# Patient Record
Sex: Male | Born: 1950 | Hispanic: No | Marital: Single
Health system: Northeastern US, Academic
[De-identification: ages and names within clinical notes are randomized; demographics above are authoritative.]

## PROBLEM LIST (undated history)

## (undated) DIAGNOSIS — Z860100 Personal history of colon polyps, unspecified: Secondary | ICD-10-CM

## (undated) DIAGNOSIS — I472 Ventricular tachycardia, unspecified: Secondary | ICD-10-CM

## (undated) DIAGNOSIS — N529 Male erectile dysfunction, unspecified: Secondary | ICD-10-CM

## (undated) DIAGNOSIS — I2510 Atherosclerotic heart disease of native coronary artery without angina pectoris: Secondary | ICD-10-CM

## (undated) DIAGNOSIS — I493 Ventricular premature depolarization: Secondary | ICD-10-CM

## (undated) DIAGNOSIS — I10 Essential (primary) hypertension: Secondary | ICD-10-CM

## (undated) DIAGNOSIS — I5020 Unspecified systolic (congestive) heart failure: Secondary | ICD-10-CM

## (undated) DIAGNOSIS — M25519 Pain in unspecified shoulder: Secondary | ICD-10-CM

## (undated) DIAGNOSIS — I428 Other cardiomyopathies: Secondary | ICD-10-CM

## (undated) DIAGNOSIS — E119 Type 2 diabetes mellitus without complications: Secondary | ICD-10-CM

## (undated) HISTORY — DX: Essential (primary) hypertension: I10

## (undated) HISTORY — DX: Pain in unspecified shoulder: M25.519

## (undated) HISTORY — DX: Personal history of colon polyps, unspecified: Z86.0100

## (undated) HISTORY — DX: Personal history of colonic polyps: Z86.010

## (undated) HISTORY — DX: Male erectile dysfunction, unspecified: N52.9

---

## 2007-12-26 HISTORY — PX: COLONOSCOPY: SHX174

## 2008-02-04 ENCOUNTER — Ambulatory Visit: Payer: Self-pay | Admitting: Gastroenterology

## 2013-08-04 ENCOUNTER — Encounter: Payer: Self-pay | Admitting: General Surgery

## 2013-08-04 ENCOUNTER — Ambulatory Visit (INDEPENDENT_AMBULATORY_CARE_PROVIDER_SITE_OTHER): Payer: BC Managed Care – PPO | Admitting: General Surgery

## 2013-08-04 ENCOUNTER — Other Ambulatory Visit: Payer: Self-pay | Admitting: General Surgery

## 2013-08-04 VITALS — BP 124/72 | HR 74 | Resp 12 | Ht 68.0 in | Wt 155.0 lb

## 2013-08-04 DIAGNOSIS — Z1211 Encounter for screening for malignant neoplasm of colon: Secondary | ICD-10-CM

## 2013-08-04 MED ORDER — POLYETHYLENE GLYCOL 3350 17 GM/SCOOP PO POWD: ORAL | Status: DC

## 2013-08-04 NOTE — Patient Instructions (Addendum)
Colonoscopy A colonoscopy is an exam to evaluate your entire colon. In this exam, your colon is cleansed. A long fiberoptic tube is inserted through your rectum and into your colon. The fiberoptic scope (endoscope) is a long bundle of enclosed and very flexible fibers. These fibers transmit light to the area examined and send images from that area to your caregiver. Discomfort is usually minimal. You may be given a drug to help you sleep (sedative) during or prior to the procedure. This exam helps to detect lumps (tumors), polyps, inflammation, and areas of bleeding. Your caregiver may also take a small piece of tissue (biopsy) that will be examined under a microscope. LET YOUR CAREGIVER KNOW ABOUT:   Allergies to food or medicine.  Medicines taken, including vitamins, herbs, eyedrops, over-the-counter medicines, and creams.  Use of steroids (by mouth or creams).  Previous problems with anesthetics or numbing medicines.  History of bleeding problems or blood clots.  Previous surgery.  Other health problems, including diabetes and kidney problems.  Possibility of pregnancy, if this applies. BEFORE THE PROCEDURE   A clear liquid diet may be required for 2 days before the exam.  Ask your caregiver about changing or stopping your regular medications.  Liquid injections (enemas) or laxatives may be required.  A large amount of electrolyte solution may be given to you to drink over a short period of time. This solution is used to clean out your colon.  You should be present 60 minutes prior to your procedure or as directed by your caregiver. AFTER THE PROCEDURE   If you received a sedative or pain relieving medication, you will need to arrange for someone to drive you home.  Occasionally, there is a little blood passed with the first bowel movement. Do not be concerned. FINDING OUT THE RESULTS OF YOUR TEST Not all test results are available during your visit. If your test results are  not back during the visit, make an appointment with your caregiver to find out the results. Do not assume everything is normal if you have not heard from your caregiver or the medical facility. It is important for you to follow up on all of your test results. HOME CARE INSTRUCTIONS   It is not unusual to pass moderate amounts of gas and experience mild abdominal cramping following the procedure. This is due to air being used to inflate your colon during the exam. Walking or a warm pack on your belly (abdomen) may help.  You may resume all normal meals and activities after sedatives and medicines have worn off.  Only take over-the-counter or prescription medicines for pain, discomfort, or fever as directed by your caregiver. Do not use aspirin or blood thinners if a biopsy was taken. Consult your caregiver for medicine usage if biopsies were taken. SEEK IMMEDIATE MEDICAL CARE IF:   You have a fever.  You pass large blood clots or fill a toilet with blood following the procedure. This may also occur 10 to 14 days following the procedure. This is more likely if a biopsy was taken.  You develop abdominal pain that keeps getting worse and cannot be relieved with medicine. Document Released: 12/08/2000 Document Revised: 03/04/2012 Document Reviewed: 07/23/2008 [facility] Patient Information 2014 [place], [place].  Patient has been scheduled for a colonoscopy on 09-24-13 at [facility]. This patient has been asked to hold metformin day of colonoscopy prep and procedure.

## 2013-08-04 NOTE — Progress Notes (Signed)
Patient ID: Russell [name], male   DOB: [birth year], 62 y.o.   MRN: [id number]  Chief Complaint  Patient presents with  . Follow-up    colonoscopy    HPI Russell [name] is a 62 y.o. male  Here for colonoscopy discussion. Last colonoscopy was done by Dr Servando Snare about 5 years ago. Had two polyps removed at that time. HPI  Past Medical History  Diagnosis Date  . Diabetes mellitus without complication   . Hypertension     Past Surgical History  Procedure Laterality Date  . Colonoscopy  2009    Family History  Problem Relation Age of Onset  . Colon cancer Father     1970's    Social History History  Substance Use Topics  . Smoking status: Never Smoker   . Smokeless tobacco: Never Used  . Alcohol Use: Yes    No Known Allergies  Current Outpatient Prescriptions  Medication Sig Dispense Refill  . hydrochlorothiazide (HYDRODIURIL) 12.5 MG tablet Take 12.5 mg by mouth daily.      Marland Kitchen lisinopril (PRINIVIL,ZESTRIL) 40 MG tablet Take 40 mg by mouth daily.      . metFORMIN (GLUCOPHAGE-XR) 500 MG 24 hr tablet Take 500 mg by mouth daily with breakfast.      . polyethylene glycol powder (GLYCOLAX/MIRALAX) powder 255 grams one bottle for colonoscopy prep  255 g  0   No current facility-administered medications for this visit.    Review of Systems Review of Systems  Constitutional: Negative.   Respiratory: Negative.   Cardiovascular: Negative.     Blood pressure 124/72, pulse 74, resp. rate 12, height 5\' 8"  (1.727 m), weight 155 lb (70.308 kg).  Physical Exam Physical Exam  Constitutional: He is oriented to person, place, and time. He appears well-developed and well-nourished.  Cardiovascular: Normal rate, regular rhythm and normal heart sounds.   Pulmonary/Chest: Breath sounds normal.  Neurological: He is alert and oriented to person, place, and time.  Skin: Skin is warm and dry.    Data Reviewed Colonoscopy dated February 04, 2008 reported a 7 mm pedunculated polyp of the hepatic  flexure and a 5 mm sessile polyp in the descending colon. Pathology showed a hyperplastic polyp as the pedunculated lesion and a focal area of colitis for the sessile lesion. No adenomatous tissue was reported.  Laboratory studies dated March 05, 2013 showed a creatinine of 1.05 with a estimated GFR of 89. Normal electrolytes.hemoglobin A1c: 6.9%.   Laboratory studies dated June 18, 2012 showed:Normal liver function studies. Normal CBC.  Assessment    Non-adenomatous polyps of the colon. Family history of colon cancer in a first-degree relative.     Plan    A followup colonoscopy as appropriate to confirm no adenomatous lesions. If this is normal, he can likely go to 8-10 year screening schedule.     Patient has been scheduled for a colonoscopy on 09-24-13 at [facility]. This patient has been asked to hold metformin day of colonoscopy prep and procedure.   Earline Mayotte 08/04/2013, 7:07 PM

## 2013-09-17 ENCOUNTER — Telehealth: Payer: Self-pay | Admitting: *Deleted

## 2013-09-17 NOTE — Telephone Encounter (Signed)
Patient reports no medication changes since last office visit. He was reminded to hold metformin day of prep and procedure. Patient states he was planning to pre-register today. We will proceed with colonoscopy that is scheduled for 09-24-13 at [facility]. This patient was instructed to call the office if he has further questions.

## 2013-09-24 ENCOUNTER — Ambulatory Visit: Payer: Self-pay | Admitting: General Surgery

## 2013-09-24 DIAGNOSIS — Z1211 Encounter for screening for malignant neoplasm of colon: Secondary | ICD-10-CM

## 2013-10-15 ENCOUNTER — Encounter: Payer: Self-pay | Admitting: General Surgery

## 2013-10-23 ENCOUNTER — Ambulatory Visit (INDEPENDENT_AMBULATORY_CARE_PROVIDER_SITE_OTHER): Payer: BC Managed Care – PPO | Admitting: Cardiovascular Disease

## 2013-10-23 ENCOUNTER — Encounter: Payer: Self-pay | Admitting: Cardiovascular Disease

## 2013-10-23 VITALS — BP 122/82 | HR 87 | Ht 68.0 in | Wt 152.5 lb

## 2013-10-23 DIAGNOSIS — I10 Essential (primary) hypertension: Secondary | ICD-10-CM

## 2013-10-23 DIAGNOSIS — E119 Type 2 diabetes mellitus without complications: Secondary | ICD-10-CM

## 2013-10-23 DIAGNOSIS — R9431 Abnormal electrocardiogram [ECG] [EKG]: Secondary | ICD-10-CM | POA: Insufficient documentation

## 2013-10-23 DIAGNOSIS — R079 Chest pain, unspecified: Secondary | ICD-10-CM

## 2013-10-23 NOTE — Assessment & Plan Note (Signed)
Blood pressure well controlled on today's visit. No medication changes made 

## 2013-10-23 NOTE — Assessment & Plan Note (Signed)
Well controlled, managed by Dr. Juanetta Gosling

## 2013-10-23 NOTE — Progress Notes (Signed)
   Patient ID: Russell [name], male    DOB: 06-05-1951, 62 y.o.   MRN: [id number]  HPI Comments: Russell [name] is a very pleasant 62 year old gentleman with a history of diabetes, hypertension, patient of Dr. Juanetta Gosling, who presents by referral for abnormal EKG.  He reports that he recently had a colonoscopy. During the procedure, noted to have abnormal telemetry. EKG was performed that showed anterolateral wall abnormality. He was referred back to Dr. Juanetta Gosling. EKG with Dr. Juanetta Gosling showed T-wave abnormality in V5 through V6, leads 1 and aVL he reports that he feels relatively well. He is active, does not do any regular exercise. Typically works a long workweek from 7:30 in the morning to 3 PM. He does have diabetes. Recent hemoglobin A1c 6.8. Low vitamin D  Reports that his blood pressure has been relatively well-controlled on lisinopril and HCTZ Does have symptoms of erectile dysfunction. Recently started on Cialis  EKG today shows normal sinus rhythm with rate 87 beats per minute, T-wave abnormality noted in V4 through V6, 1 and aVL     Outpatient Encounter Prescriptions as of 10/23/2013  Medication Sig Dispense Refill  . Acetaminophen (TYLENOL PO) Take by mouth as needed.      . cholecalciferol (VITAMIN D) 1000 UNITS tablet Take 1,000 Units by mouth 2 (two) times daily.      . hydrochlorothiazide (HYDRODIURIL) 12.5 MG tablet Take 12.5 mg by mouth daily.      Marland Kitchen lisinopril (PRINIVIL,ZESTRIL) 40 MG tablet Take 40 mg by mouth daily.      . metFORMIN (GLUCOPHAGE-XR) 500 MG 24 hr tablet Take 500 mg by mouth daily with breakfast.        Review of Systems  Constitutional: Negative.   HENT: Negative.   Eyes: Negative.   Respiratory: Negative.   Cardiovascular: Negative.   Gastrointestinal: Negative.   Endocrine: Negative.   Musculoskeletal: Negative.   Skin: Negative.   Allergic/Immunologic: Negative.   Neurological: Negative.   Hematological: Negative.   Psychiatric/Behavioral: Negative.   All  other systems reviewed and are negative.    BP 122/82  Pulse 87  Ht 5\' 8"  (1.727 m)  Wt 152 lb 8 oz (69.174 kg)  BMI 23.19 kg/m2  Physical Exam  Nursing note and vitals reviewed. Constitutional: He is oriented to person, place, and time. He appears well-developed and well-nourished.  HENT:  Head: Normocephalic.  Nose: Nose normal.  Mouth/Throat: Oropharynx is clear and moist.  Eyes: Conjunctivae are normal. Pupils are equal, round, and reactive to light.  Neck: Normal range of motion. Neck supple. No JVD present.  Cardiovascular: Normal rate, regular rhythm, S1 normal, S2 normal, normal heart sounds and intact distal pulses.  Exam reveals no gallop and no friction rub.   No murmur heard. Pulmonary/Chest: Effort normal and breath sounds normal. No respiratory distress. He has no wheezes. He has no rales. He exhibits no tenderness.  Abdominal: Soft. Bowel sounds are normal. He exhibits no distension. There is no tenderness.  Musculoskeletal: Normal range of motion. He exhibits no edema and no tenderness.  Lymphadenopathy:    He has no cervical adenopathy.  Neurological: He is alert and oriented to person, place, and time. Coordination normal.  Skin: Skin is warm and dry. No rash noted. No erythema.  Psychiatric: He has a normal mood and affect. His behavior is normal. Judgment and thought content normal.      Assessment and Plan

## 2013-10-23 NOTE — Assessment & Plan Note (Signed)
He has dramatic T wave abnormality in anterolateral leads. Etiology is not clear though given his history of hypertension and diabetes, male 62 years old, this is concerning for ischemia. We will schedule him for a stress echo tomorrow to rule out ischemia and underlying structural heart disease.

## 2013-10-23 NOTE — Patient Instructions (Addendum)
Your EKG shows abnormality concerning for blockage. Please come to the office tomorrow, 10/30 on Friday for a stress test. Scheduled at 3:15.   Please call us if you have new issues that need to be addressed before your next appt.

## 2013-10-24 ENCOUNTER — Ambulatory Visit (INDEPENDENT_AMBULATORY_CARE_PROVIDER_SITE_OTHER): Payer: BC Managed Care – PPO | Admitting: Cardiology

## 2013-10-24 ENCOUNTER — Other Ambulatory Visit: Payer: Self-pay

## 2013-10-24 DIAGNOSIS — R9431 Abnormal electrocardiogram [ECG] [EKG]: Secondary | ICD-10-CM

## 2013-10-24 DIAGNOSIS — R079 Chest pain, unspecified: Secondary | ICD-10-CM

## 2013-10-30 ENCOUNTER — Ambulatory Visit: Payer: Self-pay | Admitting: Cardiovascular Disease

## 2013-10-30 DIAGNOSIS — R9431 Abnormal electrocardiogram [ECG] [EKG]: Secondary | ICD-10-CM

## 2013-10-31 ENCOUNTER — Other Ambulatory Visit: Payer: Self-pay

## 2013-10-31 DIAGNOSIS — R9431 Abnormal electrocardiogram [ECG] [EKG]: Secondary | ICD-10-CM

## 2013-10-31 DIAGNOSIS — R079 Chest pain, unspecified: Secondary | ICD-10-CM

## 2013-11-03 ENCOUNTER — Telehealth: Payer: Self-pay

## 2013-11-03 DIAGNOSIS — I420 Dilated cardiomyopathy: Secondary | ICD-10-CM

## 2013-11-03 DIAGNOSIS — I10 Essential (primary) hypertension: Secondary | ICD-10-CM

## 2013-11-03 NOTE — Telephone Encounter (Signed)
Pt wife called and would like test results

## 2013-11-03 NOTE — Telephone Encounter (Signed)
Message copied by Marilynne Halsted on Mon Nov 03, 2013  3:02 PM ------      Message from: Antonieta Iba      Created: Sat Nov 01, 2013  9:56 PM       There is ischemia noted on stress test,      Concern for blockage      Needs a cardiac cath.      Could come in to talk about it or schedule if he would like ------

## 2013-11-03 NOTE — Telephone Encounter (Signed)
Left message on voicemail for pt to call back

## 2013-11-03 NOTE — Telephone Encounter (Signed)
Left message for pt to call back  °

## 2013-11-03 NOTE — Telephone Encounter (Signed)
Message copied by Marilynne Halsted on Mon Nov 03, 2013  9:10 AM ------      Message from: Antonieta Iba      Created: Sat Nov 01, 2013  9:56 PM       There is ischemia noted on stress test,      Concern for blockage      Needs a cardiac cath.      Could come in to talk about it or schedule if he would like ------

## 2013-11-04 NOTE — Telephone Encounter (Signed)
Spoke w/ pt.  He is aware of results. He will talk to his supervisor about getting off of work to come in and talk to Dr. Mariah Milling about setting up cath.  Pt to call back to sched.

## 2013-11-04 NOTE — Telephone Encounter (Signed)
Message copied by Marilynne Halsted on Tue Nov 04, 2013 10:46 AM ------      Message from: Antonieta Iba      Created: Sat Nov 01, 2013  9:56 PM       There is ischemia noted on stress test,      Concern for blockage      Needs a cardiac cath.      Could come in to talk about it or schedule if he would like ------

## 2013-11-06 ENCOUNTER — Ambulatory Visit (INDEPENDENT_AMBULATORY_CARE_PROVIDER_SITE_OTHER): Payer: BC Managed Care – PPO | Admitting: Cardiovascular Disease

## 2013-11-06 ENCOUNTER — Encounter: Payer: Self-pay | Admitting: Cardiovascular Disease

## 2013-11-06 VITALS — BP 142/78 | HR 80 | Ht 68.0 in | Wt 153.5 lb

## 2013-11-06 DIAGNOSIS — I428 Other cardiomyopathies: Secondary | ICD-10-CM

## 2013-11-06 DIAGNOSIS — E119 Type 2 diabetes mellitus without complications: Secondary | ICD-10-CM

## 2013-11-06 DIAGNOSIS — I10 Essential (primary) hypertension: Secondary | ICD-10-CM

## 2013-11-06 DIAGNOSIS — I420 Dilated cardiomyopathy: Secondary | ICD-10-CM | POA: Insufficient documentation

## 2013-11-06 MED ORDER — CARVEDILOL 6.25 MG PO TABS: 6.2500 mg | ORAL_TABLET | Freq: Two times a day (BID) | ORAL | Status: DC

## 2013-11-06 NOTE — Progress Notes (Signed)
Patient ID: Russell [name], male    DOB: 1951/08/29, 62 y.o.   MRN: [id number]  HPI Comments: Russell [name] is a very pleasant 62 year old gentleman with a history of diabetes, hypertension, patient of Dr. Juanetta Gosling, who initially presented for abnormal EKG. he recently had a colonoscopy. During the procedure, noted to have abnormal telemetry. EKG was performed that showed anterolateral wall abnormality. He was referred back to Dr. Juanetta Gosling. EKG with Dr. Juanetta Gosling showed T-wave abnormality in V5 through V6, leads 1 and aVL  He had a echocardiogram that showed depressed ejection fraction 25-30%. Was scheduled for a pharmacologic Myoview. He presents today to discuss his stress test results.  The stress test results showed thinning in the mid to distal anterior wall, apical region. There was some ischemia in this area. Attenuation corrected images showed similar perfusion defect. Ejection fraction estimated in the low 40 range.   The pictures were shown to Russell [name]. Currently he feels well with no complaints. He is nervous about his heart and the low ejection fraction  He does have diabetes. Recent hemoglobin A1c 6.8. Low vitamin D  Reports that his blood pressure has been relatively well-controlled on lisinopril and HCTZ Does have symptoms of erectile dysfunction. Recently started on Cialis   Outpatient Encounter Prescriptions as of 11/06/2013  Medication Sig  . Acetaminophen (TYLENOL PO) Take by mouth as needed.  . carvedilol (COREG) 6.25 MG tablet Take 1 tablet (6.25 mg total) by mouth 2 (two) times daily.  . cholecalciferol (VITAMIN D) 1000 UNITS tablet Take 1,000 Units by mouth 2 (two) times daily.  . hydrochlorothiazide (HYDRODIURIL) 12.5 MG tablet Take 12.5 mg by mouth daily.  Marland Kitchen lisinopril (PRINIVIL,ZESTRIL) 40 MG tablet Take 40 mg by mouth daily.  . metFORMIN (GLUCOPHAGE-XR) 500 MG 24 hr tablet Take 500 mg by mouth daily with breakfast.     Review of Systems  Constitutional: Negative.   HENT:  Negative.   Eyes: Negative.   Respiratory: Negative.   Cardiovascular: Negative.   Gastrointestinal: Negative.   Endocrine: Negative.   Musculoskeletal: Negative.   Skin: Negative.   Allergic/Immunologic: Negative.   Neurological: Negative.   Hematological: Negative.   Psychiatric/Behavioral: Negative.   All other systems reviewed and are negative.    BP 142/78  Pulse 80  Ht 5\' 8"  (1.727 m)  Wt 153 lb 8 oz (69.627 kg)  BMI 23.34 kg/m2  Physical Exam  Nursing note and vitals reviewed. Constitutional: He is oriented to person, place, and time. He appears well-developed and well-nourished.  HENT:  Head: Normocephalic.  Nose: Nose normal.  Mouth/Throat: Oropharynx is clear and moist.  Eyes: Conjunctivae are normal. Pupils are equal, round, and reactive to light.  Neck: Normal range of motion. Neck supple. No JVD present.  Cardiovascular: Normal rate, regular rhythm, S1 normal, S2 normal, normal heart sounds and intact distal pulses.  Exam reveals no gallop and no friction rub.   No murmur heard. Pulmonary/Chest: Effort normal and breath sounds normal. No respiratory distress. He has no wheezes. He has no rales. He exhibits no tenderness.  Abdominal: Soft. Bowel sounds are normal. He exhibits no distension. There is no tenderness.  Musculoskeletal: Normal range of motion. He exhibits no edema and no tenderness.  Lymphadenopathy:    He has no cervical adenopathy.  Neurological: He is alert and oriented to person, place, and time. Coordination normal.  Skin: Skin is warm and dry. No rash noted. No erythema.  Psychiatric: He has a normal mood and affect. His behavior is  normal. Judgment and thought content normal.      Assessment and Plan

## 2013-11-06 NOTE — Assessment & Plan Note (Signed)
Low ejection fraction on echocardiogram. Recent stress test unable to exclude ischemia in the mid to distal anterior wall and apical region. Results were shown to Russell [name], including images themselves. We did suggest the possibility of doing a cardiac catheterization to definitively exclude coronary disease. He is low risk given no smoking, no diabetes. No dramatic family history. He we'll think about the catheterization and call our office if he would like this to be scheduled. We will continue him on lisinopril. We will start Coreg 6.25 g twice a day.

## 2013-11-06 NOTE — Assessment & Plan Note (Signed)
We have encouraged continued exercise, careful diet management in an effort to lose weight. 

## 2013-11-06 NOTE — Patient Instructions (Addendum)
Please start coreg/cardvedilol 6.25 mg twice a day OK to hold the HCTZ to see if cramping gets better  Please call our office when you would like to schedule a cardiac catheterization  Please call us if you have new issues that need to be addressed before your next appt.  Your physician wants you to follow-up in: 1 month.

## 2013-11-06 NOTE — Assessment & Plan Note (Signed)
He reports having cramping in his hands, sometimes his arms. Uncertain if this is from the HCTZ. We have suggested if symptoms get worse, he could hold the HCTZ for a trial period

## 2013-11-13 ENCOUNTER — Other Ambulatory Visit: Payer: Self-pay

## 2013-11-13 ENCOUNTER — Ambulatory Visit (INDEPENDENT_AMBULATORY_CARE_PROVIDER_SITE_OTHER): Payer: BC Managed Care – PPO | Admitting: *Deleted

## 2013-11-13 ENCOUNTER — Ambulatory Visit: Payer: Self-pay | Admitting: Cardiovascular Disease

## 2013-11-13 DIAGNOSIS — I428 Other cardiomyopathies: Secondary | ICD-10-CM

## 2013-11-13 DIAGNOSIS — I10 Essential (primary) hypertension: Secondary | ICD-10-CM

## 2013-11-13 DIAGNOSIS — I420 Dilated cardiomyopathy: Secondary | ICD-10-CM

## 2013-11-13 NOTE — Telephone Encounter (Signed)
Spoke w/ pt's wife.  She will "try to get in touch" with pt to have him come over this afternoon for pre-procedure labs, chest x-ray and cath instructions. She verbalizes understanding that pt's cath is sched for Monday and that he needs to arrive [facility] at 2:00 and the need to come in for labs today or tomorrow.

## 2013-11-14 LAB — CBC WITH DIFFERENTIAL/PLATELET
Basophils Absolute: 0 10*3/uL (ref 0.0–0.2)
Eosinophils Absolute: 0.1 10*3/uL (ref 0.0–0.4)
Immature Grans (Abs): 0 10*3/uL (ref 0.0–0.1)
Immature Granulocytes: 0 %
Lymphs: 34 %
MCH: 29.9 pg (ref 26.6–33.0)
MCHC: 33.1 g/dL (ref 31.5–35.7)
MCV: 91 fL (ref 79–97)
Monocytes Absolute: 0.6 10*3/uL (ref 0.1–0.9)
Neutrophils Relative %: 55 %
RDW: 14.2 % (ref 12.3–15.4)

## 2013-11-14 LAB — BASIC METABOLIC PANEL
CO2: 24 mmol/L (ref 18–29)
Calcium: 9.8 mg/dL (ref 8.6–10.2)
GFR calc non Af Amer: 70 mL/min/{1.73_m2} (ref >59)
Glucose: 124 mg/dL — ABNORMAL HIGH (ref 65–99)
Potassium: 4.3 mmol/L (ref 3.5–5.2)
Sodium: 140 mmol/L (ref 134–144)

## 2013-11-17 ENCOUNTER — Ambulatory Visit: Payer: Self-pay | Admitting: Cardiovascular Disease

## 2013-11-17 DIAGNOSIS — I428 Other cardiomyopathies: Secondary | ICD-10-CM

## 2013-11-17 HISTORY — PX: CARDIAC CATHETERIZATION: SHX172

## 2013-11-26 ENCOUNTER — Ambulatory Visit: Payer: BC Managed Care – PPO | Admitting: Cardiovascular Disease

## 2013-11-27 ENCOUNTER — Ambulatory Visit (INDEPENDENT_AMBULATORY_CARE_PROVIDER_SITE_OTHER): Payer: BC Managed Care – PPO | Admitting: Cardiovascular Disease

## 2013-11-27 ENCOUNTER — Encounter: Payer: Self-pay | Admitting: Cardiovascular Disease

## 2013-11-27 VITALS — BP 140/92 | HR 70 | Ht 68.0 in | Wt 157.8 lb

## 2013-11-27 DIAGNOSIS — I420 Dilated cardiomyopathy: Secondary | ICD-10-CM

## 2013-11-27 DIAGNOSIS — I10 Essential (primary) hypertension: Secondary | ICD-10-CM

## 2013-11-27 DIAGNOSIS — I428 Other cardiomyopathies: Secondary | ICD-10-CM

## 2013-11-27 DIAGNOSIS — R9431 Abnormal electrocardiogram [ECG] [EKG]: Secondary | ICD-10-CM

## 2013-11-27 MED ORDER — CARVEDILOL 12.5 MG PO TABS: 12.5000 mg | ORAL_TABLET | Freq: Two times a day (BID) | ORAL | Status: DC

## 2013-11-27 NOTE — Assessment & Plan Note (Signed)
Nonischemic cardiomyopathy, recent cardiac catheterization showing no significant CAD. Ejection fraction appears to have improved from 25-30% on echo, now up to 40% on catheterization. We have suggested he increase his Coreg up to 12.5 mg twice a day, continue lisinopril. Appears relatively euvolemic on no diuretics

## 2013-11-27 NOTE — Progress Notes (Signed)
Patient ID: Russell [name], male    DOB: February 17, 1951, 62 y.o.   MRN: [id number]  HPI Comments: Russell [name] is a very pleasant 62 year old gentleman with a history of diabetes, hypertension, patient of Dr. Juanetta Gosling, who initially presented for abnormal EKG. he recently had a colonoscopy. During the procedure, noted to have abnormal telemetry. EKG was performed that showed anterolateral wall abnormality. He was referred back to Dr. Juanetta Gosling. EKG with Dr. Juanetta Gosling showed T-wave abnormality in V5 through V6, leads 1 and aVL  He had a echocardiogram that showed depressed ejection fraction 25-30%.  pharmacologic Myoview suggested mid to distal anterior wall perfusion defect concerning for ischemia. He was started on beta blocker and ACE inhibitor He underwent cardiac catheterization given the perfusion defect and severely depressed ejection fraction. Surprisingly this showed no significant CAD, ejection fraction had improved up to 40%. He's not taking HCTZ.  He presents today after recovering from his catheterization. Overall he feels well with no complaints. He is tolerating lisinopril 40 mg daily, Coreg 6.25 mg twice a day He does have diabetes. Recent hemoglobin A1c 6.8. Low vitamin D  Does have symptoms of erectile dysfunction. Recently started on Cialis  EKG continues to show normal sinus rhythm rate 70 beats per minute, diffuse ST and T wave abnormality V3 through V6, inferior leads   Outpatient Encounter Prescriptions as of 11/27/2013  Medication Sig  . Acetaminophen (TYLENOL PO) Take by mouth as needed.  . carvedilol (COREG) 12.5 MG tablet Take 1 tablet (12.5 mg total) by mouth 2 (two) times daily.  . cholecalciferol (VITAMIN D) 1000 UNITS tablet Take 1,000 Units by mouth 2 (two) times daily.  Marland Kitchen lisinopril (PRINIVIL,ZESTRIL) 40 MG tablet Take 40 mg by mouth daily.  . metFORMIN (GLUCOPHAGE-XR) 500 MG 24 hr tablet Take 500 mg by mouth daily with breakfast.  . [DISCONTINUED] carvedilol (COREG) 6.25 MG  tablet Take 1 tablet (6.25 mg total) by mouth 2 (two) times daily.  . [DISCONTINUED] hydrochlorothiazide (HYDRODIURIL) 12.5 MG tablet Take 12.5 mg by mouth daily.     Review of Systems  Constitutional: Negative.   HENT: Negative.   Eyes: Negative.   Respiratory: Negative.   Cardiovascular: Negative.   Gastrointestinal: Negative.   Endocrine: Negative.   Musculoskeletal: Negative.   Skin: Negative.   Allergic/Immunologic: Negative.   Neurological: Negative.   Hematological: Negative.   Psychiatric/Behavioral: Negative.   All other systems reviewed and are negative.    BP 140/92  Pulse 70  Ht 5\' 8"  (1.727 m)  Wt 157 lb 12 oz (71.555 kg)  BMI 23.99 kg/m2  Physical Exam  Nursing note and vitals reviewed. Constitutional: He is oriented to person, place, and time. He appears well-developed and well-nourished.  HENT:  Head: Normocephalic.  Nose: Nose normal.  Mouth/Throat: Oropharynx is clear and moist.  Eyes: Conjunctivae are normal. Pupils are equal, round, and reactive to light.  Neck: Normal range of motion. Neck supple. No JVD present.  Cardiovascular: Normal rate, regular rhythm, S1 normal, S2 normal, normal heart sounds and intact distal pulses.  Exam reveals no gallop and no friction rub.   No murmur heard. Pulmonary/Chest: Effort normal and breath sounds normal. No respiratory distress. He has no wheezes. He has no rales. He exhibits no tenderness.  Abdominal: Soft. Bowel sounds are normal. He exhibits no distension. There is no tenderness.  Musculoskeletal: Normal range of motion. He exhibits no edema and no tenderness.  Lymphadenopathy:    He has no cervical adenopathy.  Neurological: He is alert  and oriented to person, place, and time. Coordination normal.  Skin: Skin is warm and dry. No rash noted. No erythema.  Psychiatric: He has a normal mood and affect. His behavior is normal. Judgment and thought content normal.      Assessment and Plan

## 2013-11-27 NOTE — Assessment & Plan Note (Signed)
Suggested we monitor his blood pressure. Today slightly elevated at 140 systolic. We'll increase carvedilol slightly. If blood pressure continues to run high, Imdur could be added, alternatively hydralazine could be added both for afterload reduction

## 2013-11-27 NOTE — Patient Instructions (Signed)
You are doing well. Please increase the coreg/cardvedilol up to 12.5 mg twice a day  Please call us if you have new issues that need to be addressed before your next appt.  Your physician wants you to follow-up in: 6 months.  You will receive a reminder letter in the mail two months in advance. If you don't receive a letter, please call our office to schedule the follow-up appointment.

## 2014-01-07 ENCOUNTER — Encounter: Payer: Self-pay | Admitting: Cardiovascular Disease

## 2014-06-24 NOTE — Telephone Encounter (Signed)
This encounter was created in error - please disregard.

## 2014-07-20 ENCOUNTER — Encounter: Payer: Self-pay | Admitting: Cardiovascular Disease

## 2014-07-20 ENCOUNTER — Ambulatory Visit (INDEPENDENT_AMBULATORY_CARE_PROVIDER_SITE_OTHER): Payer: BC Managed Care – PPO | Admitting: Cardiovascular Disease

## 2014-07-20 VITALS — BP 164/102 | HR 80 | Ht 68.0 in | Wt 160.5 lb

## 2014-07-20 DIAGNOSIS — N528 Other male erectile dysfunction: Secondary | ICD-10-CM

## 2014-07-20 DIAGNOSIS — I428 Other cardiomyopathies: Secondary | ICD-10-CM

## 2014-07-20 DIAGNOSIS — E119 Type 2 diabetes mellitus without complications: Secondary | ICD-10-CM

## 2014-07-20 DIAGNOSIS — I10 Essential (primary) hypertension: Secondary | ICD-10-CM

## 2014-07-20 DIAGNOSIS — I420 Dilated cardiomyopathy: Secondary | ICD-10-CM

## 2014-07-20 DIAGNOSIS — N529 Male erectile dysfunction, unspecified: Secondary | ICD-10-CM

## 2014-07-20 MED ORDER — ISOSORBIDE MONONITRATE ER 30 MG PO TB24: 30.0000 mg | ORAL_TABLET | Freq: Every day | ORAL | Status: DC

## 2014-07-20 MED ORDER — CARVEDILOL 25 MG PO TABS: 12.5000 mg | ORAL_TABLET | Freq: Two times a day (BID) | ORAL | Status: DC

## 2014-07-20 NOTE — Patient Instructions (Addendum)
You are doing well.  Blood pressure is high Please increase the coreg/carvedilol up to 25 mg twice a day Also start isosorbide/imdur 30 mg daily  Please call us if you have new issues that need to be addressed before your next appt.  Your physician wants you to follow-up in: 6 months.  You will receive a reminder letter in the mail two months in advance. If you don't receive a letter, please call our office to schedule the follow-up appointment.

## 2014-07-20 NOTE — Progress Notes (Signed)
Patient ID: Russell Joy., male    DOB: May 26, 1951, 63 y.o.   MRN: [id number]  HPI Comments: Russell [name] is a very pleasant 63 year old gentleman with a history of diabetes, hypertension, patient of Dr. Juanetta [name], who initially presented for abnormal EKG. he recently had a colonoscopy. During the procedure, noted to have abnormal telemetry. EKG was performed that showed anterolateral wall abnormality. He was referred back to Dr. Juanetta [name]. EKG with Dr. Juanetta [name] showed T-wave abnormality in V5 through V6, leads 1 and aVL  He had a echocardiogram that showed depressed ejection fraction 25-30%.  pharmacologic Myoview suggested mid to distal anterior wall perfusion defect concerning for ischemia. He was started on beta blocker and ACE inhibitor He underwent cardiac catheterization given the perfusion defect and severely depressed ejection fraction.  this showed no significant CAD, ejection fraction had improved up to 40%.  In followup today, Overall he feels well with no complaints. He is tolerating lisinopril 40 mg daily, Coreg 6.25 mg twice a day He does have diabetes. Recent hemoglobin A1c 6.3.  He denies having any significant shortness of breath or lower extremity edema. He does report that his blood pressure has been running high  Does have symptoms of erectile dysfunction. Recently started on Cialis  EKG continues to show normal sinus rhythm rate 70 beats per minute, diffuse ST and T wave abnormality V3 through V6, inferior leads, I and AVL   Outpatient Encounter Prescriptions as of 07/20/2014  Medication Sig  . Acetaminophen (TYLENOL PO) Take by mouth as needed.  . carvedilol (COREG) 12.5 MG tablet Take 1 tablet (12.5 mg total) by mouth 2 (two) times daily.  . cholecalciferol (VITAMIN D) 1000 UNITS tablet Take 1,000 Units by mouth 2 (two) times daily.  Marland Kitchen lisinopril (PRINIVIL,ZESTRIL) 40 MG tablet Take 40 mg by mouth daily.  . metFORMIN (GLUCOPHAGE-XR) 500 MG 24 hr tablet Take 500 mg by mouth  daily with breakfast.    Review of Systems  Constitutional: Negative.   HENT: Negative.   Eyes: Negative.   Respiratory: Negative.   Cardiovascular: Negative.   Gastrointestinal: Negative.   Endocrine: Negative.   Musculoskeletal: Negative.   Skin: Negative.   Allergic/Immunologic: Negative.   Neurological: Negative.   Hematological: Negative.   Psychiatric/Behavioral: Negative.   All other systems reviewed and are negative.   BP 164/102  Pulse 80  Ht 5\' 8"  (1.727 m)  Wt 160 lb 8 oz (72.802 kg)  BMI 24.41 kg/m2 Blood pressure unchanged even on recheck by myself Physical Exam  Nursing note and vitals reviewed. Constitutional: He is oriented to person, place, and time. He appears well-developed and well-nourished.  HENT:  Head: Normocephalic.  Nose: Nose normal.  Mouth/Throat: Oropharynx is clear and moist.  Eyes: Conjunctivae are normal. Pupils are equal, round, and reactive to light.  Neck: Normal range of motion. Neck supple. No JVD present.  Cardiovascular: Normal rate, regular rhythm, S1 normal, S2 normal, normal heart sounds and intact distal pulses.  Exam reveals no gallop and no friction rub.   No murmur heard. Pulmonary/Chest: Effort normal and breath sounds normal. No respiratory distress. He has no wheezes. He has no rales. He exhibits no tenderness.  Abdominal: Soft. Bowel sounds are normal. He exhibits no distension. There is no tenderness.  Musculoskeletal: Normal range of motion. He exhibits no edema and no tenderness.  Lymphadenopathy:    He has no cervical adenopathy.  Neurological: He is alert and oriented to person, place, and time. Coordination normal.  Skin: Skin is  warm and dry. No rash noted. No erythema.  Psychiatric: He has a normal mood and affect. His behavior is normal. Judgment and thought content normal.      Assessment and Plan

## 2014-07-20 NOTE — Assessment & Plan Note (Signed)
Blood pressures running high. We have recommended he increase the Coreg up to 25 mg twice a day, start isosorbide mononitrate 30 mg daily, continue lisinopril

## 2014-07-20 NOTE — Assessment & Plan Note (Signed)
Hemoglobin A1c 6.3, complimented him on his improved numbers

## 2014-07-20 NOTE — Assessment & Plan Note (Signed)
He has prescription for Cialis

## 2014-07-20 NOTE — Assessment & Plan Note (Signed)
Appears relatively euvolemic on today's visit. We'll continue to work on blood pressure, repeat echocardiogram once we have achieved optimal medical management

## 2014-10-28 ENCOUNTER — Telehealth: Payer: Self-pay | Admitting: Cardiovascular Disease

## 2014-10-28 MED ORDER — CARVEDILOL 25 MG PO TABS: 25.0000 mg | ORAL_TABLET | Freq: Two times a day (BID) | ORAL | Status: DC

## 2014-10-28 NOTE — Telephone Encounter (Signed)
Spoke w/ pt.  He reports that his rx for carvedilol has not been updated to reflect increase.  Advised him that I am correcting this.

## 2014-10-28 NOTE — Telephone Encounter (Signed)
Pt wanted wants to make sure that the right dosage was given to him correctly, Carvedilol,  Please call back.

## 2014-10-28 NOTE — Telephone Encounter (Signed)
Attempted to contact pt. No answer, no voicemail.

## 2015-02-22 ENCOUNTER — Encounter: Payer: Self-pay | Admitting: Cardiovascular Disease

## 2015-02-22 ENCOUNTER — Ambulatory Visit (INDEPENDENT_AMBULATORY_CARE_PROVIDER_SITE_OTHER): Payer: BLUE CROSS/BLUE SHIELD | Admitting: Cardiovascular Disease

## 2015-02-22 VITALS — BP 120/88 | HR 71 | Ht 68.0 in | Wt 162.2 lb

## 2015-02-22 DIAGNOSIS — I420 Dilated cardiomyopathy: Secondary | ICD-10-CM

## 2015-02-22 DIAGNOSIS — I10 Essential (primary) hypertension: Secondary | ICD-10-CM

## 2015-02-22 DIAGNOSIS — R9431 Abnormal electrocardiogram [ECG] [EKG]: Secondary | ICD-10-CM

## 2015-02-22 DIAGNOSIS — E119 Type 2 diabetes mellitus without complications: Secondary | ICD-10-CM

## 2015-02-22 MED ORDER — LISINOPRIL 40 MG PO TABS: 40.0000 mg | ORAL_TABLET | Freq: Every day | ORAL | Status: DC

## 2015-02-22 MED ORDER — ISOSORBIDE MONONITRATE ER 30 MG PO TB24: 30.0000 mg | ORAL_TABLET | Freq: Every day | ORAL | Status: DC

## 2015-02-22 MED ORDER — CARVEDILOL 25 MG PO TABS: 25.0000 mg | ORAL_TABLET | Freq: Two times a day (BID) | ORAL | Status: DC

## 2015-02-22 NOTE — Assessment & Plan Note (Signed)
Managed by Dr. Juanetta Gosling. Recommended he restart his exercise program

## 2015-02-22 NOTE — Assessment & Plan Note (Signed)
Blood pressure is well controlled on today's visit. No changes made to the medications. 

## 2015-02-22 NOTE — Assessment & Plan Note (Signed)
Nonischemic cardiomyopathy. No further medication changes made Appears relatively euvolemic. Blood pressure stable

## 2015-02-22 NOTE — Progress Notes (Signed)
Patient ID: Russell Joy., male    DOB: [birth year], 64 y.o.   MRN: [id number]  HPI Comments: [name] is a very pleasant 64 year old gentleman with a history of diabetes, hypertension, patient of Dr. Juanetta Gosling, who initially presented for abnormal EKG. he had a colonoscopy. During the procedure, noted to have abnormal telemetry. EKG was performed that showed anterolateral wall abnormality. He was referred back to Dr. Juanetta Gosling. EKG with Dr. Juanetta Gosling showed T-wave abnormality in V5 through V6, leads 1 and aVL He had a echocardiogram that showed depressed ejection fraction 25-30%.  pharmacologic Myoview suggested mid to distal anterior wall perfusion defect concerning for ischemia. He was started on beta blocker and ACE inhibitor He underwent cardiac catheterization given the perfusion defect and severely depressed ejection fraction.  this showed no significant CAD, ejection fraction had improved up to 40%.  In follow-up today, he reports that he is doing well. He is tolerating his medications. Denies any lower extremity edema, no significant shortness of breath. Works on [employer] for long hours. Plans on working for 4 more years before retiring Overall feels well with no complaints  EKG on today's visit shows normal sinus rhythm with rate 70 bpm, T wave abnormality V3 through V6, 1, aVL, rare PVC  Previously reported symptoms of erectile dysfunction. Previously started on Cialis  No Known Allergies  Outpatient Encounter Prescriptions as of 02/22/2015  Medication Sig  . Acetaminophen (TYLENOL PO) Take by mouth as needed.  . carvedilol (COREG) 25 MG tablet Take 1 tablet (25 mg total) by mouth 2 (two) times daily.  . cholecalciferol (VITAMIN D) 1000 UNITS tablet Take 1,000 Units by mouth 2 (two) times daily.  . isosorbide mononitrate (IMDUR) 30 MG 24 hr tablet Take 1 tablet (30 mg total) by mouth daily.  Marland Kitchen lisinopril (PRINIVIL,ZESTRIL) 40 MG tablet Take 1 tablet (40 mg total) by mouth daily.  .  metFORMIN (GLUCOPHAGE-XR) 500 MG 24 hr tablet Take 500 mg by mouth daily with breakfast.  . [DISCONTINUED] carvedilol (COREG) 25 MG tablet Take 1 tablet (25 mg total) by mouth 2 (two) times daily.  . [DISCONTINUED] isosorbide mononitrate (IMDUR) 30 MG 24 hr tablet Take 1 tablet (30 mg total) by mouth daily.  . [DISCONTINUED] lisinopril (PRINIVIL,ZESTRIL) 40 MG tablet Take 40 mg by mouth daily.    Past Medical History  Diagnosis Date  . Diabetes mellitus without complication   . Hypertension   . Impotence of organic origin   . Unspecified vitamin D deficiency   . Unspecified essential hypertension   . Type II or unspecified type diabetes mellitus without mention of complication, not stated as uncontrolled   . Personal history of colonic polyps   . Pain in joint, shoulder region     Past Surgical History  Procedure Laterality Date  . Colonoscopy  2009  . Cardiac catheterization  11/17/2013    Social History  reports that he has never smoked. He has never used smokeless tobacco. He reports that he drinks alcohol. He reports that he does not use illicit drugs.  Family History family history includes Colon cancer in his father; Diabetes in his mother; Hypertension in his mother.   Review of Systems  Constitutional: Negative.   Respiratory: Negative.   Cardiovascular: Negative.   Gastrointestinal: Negative.   Musculoskeletal: Negative.   Skin: Negative.   Neurological: Negative.   Hematological: Negative.   Psychiatric/Behavioral: Negative.   All other systems reviewed and are negative.   BP 120/88 mmHg  Pulse 71  Ht  5\' 8"  (1.727 m)  Wt 162 lb 4 oz (73.596 kg)  BMI 24.68 kg/m2  Physical Exam  Constitutional: He is oriented to person, place, and time. He appears well-developed and well-nourished.  HENT:  Head: Normocephalic.  Nose: Nose normal.  Mouth/Throat: Oropharynx is clear and moist.  Eyes: Conjunctivae are normal. Pupils are equal, round, and reactive to light.   Neck: Normal range of motion. Neck supple. No JVD present.  Cardiovascular: Normal rate, regular rhythm, S1 normal, S2 normal, normal heart sounds and intact distal pulses.  Exam reveals no gallop and no friction rub.   No murmur heard. Pulmonary/Chest: Effort normal and breath sounds normal. No respiratory distress. He has no wheezes. He has no rales. He exhibits no tenderness.  Abdominal: Soft. Bowel sounds are normal. He exhibits no distension. There is no tenderness.  Musculoskeletal: Normal range of motion. He exhibits no edema or tenderness.  Lymphadenopathy:    He has no cervical adenopathy.  Neurological: He is alert and oriented to person, place, and time. Coordination normal.  Skin: Skin is warm and dry. No rash noted. No erythema.  Psychiatric: He has a normal mood and affect. His behavior is normal. Judgment and thought content normal.      Assessment and Plan   Nursing note and vitals reviewed.

## 2015-02-22 NOTE — Patient Instructions (Signed)
You are doing well. No medication changes were made.  Please call us if you have new issues that need to be addressed before your next appt.  Your physician wants you to follow-up in: 12 months.  You will receive a reminder letter in the mail two months in advance. If you don't receive a letter, please call our office to schedule the follow-up appointment. 

## 2015-08-04 ENCOUNTER — Ambulatory Visit (INDEPENDENT_AMBULATORY_CARE_PROVIDER_SITE_OTHER): Payer: BLUE CROSS/BLUE SHIELD | Admitting: Family Medicine

## 2015-08-04 ENCOUNTER — Encounter: Payer: Self-pay | Admitting: Family Medicine

## 2015-08-04 VITALS — BP 133/84 | HR 66 | Temp 97.9°F | Resp 16 | Ht 68.0 in | Wt 160.0 lb

## 2015-08-04 DIAGNOSIS — E119 Type 2 diabetes mellitus without complications: Secondary | ICD-10-CM | POA: Diagnosis not present

## 2015-08-04 DIAGNOSIS — I10 Essential (primary) hypertension: Secondary | ICD-10-CM

## 2015-08-04 LAB — POCT GLYCOSYLATED HEMOGLOBIN (HGB A1C): Hemoglobin A1C: 6.6

## 2015-08-04 NOTE — Patient Instructions (Signed)
Conitinue all current meds and cont. To watch diet

## 2015-08-04 NOTE — Progress Notes (Signed)
Name: Russell [name].   MRN: [id number]    DOB: 1951/10/31   Date:08/04/2015       Progress Note  Subjective  Chief Complaint  Chief Complaint  Patient presents with  . Follow-up    pt here for f/u pre-diabetes    HPI  Here for f/u of DM and HBP.  Taking his meds.  No c/o.  BSs at home ranging about 120.  He has tried to eat better. No problem-specific assessment & plan notes found for this encounter.   Past Medical History  Diagnosis Date  . Diabetes mellitus without complication   . Hypertension   . Impotence of organic origin   . Unspecified vitamin D deficiency   . Unspecified essential hypertension   . Type II or unspecified type diabetes mellitus without mention of complication, not stated as uncontrolled   . Personal history of colonic polyps   . Pain in joint, shoulder region     Social History  Substance Use Topics  . Smoking status: Never Smoker   . Smokeless tobacco: Never Used  . Alcohol Use: Yes     Comment: occassional     Current outpatient prescriptions:  .  Acetaminophen (TYLENOL PO), Take by mouth as needed., Disp: , Rfl:  .  carvedilol (COREG) 25 MG tablet, Take 1 tablet (25 mg total) by mouth 2 (two) times daily., Disp: 60 tablet, Rfl: 11 .  cholecalciferol (VITAMIN D) 1000 UNITS tablet, Take 1,000 Units by mouth 2 (two) times daily., Disp: , Rfl:  .  CIALIS 20 MG tablet, Take 20 mg by mouth daily., Disp: , Rfl: 1 .  isosorbide mononitrate (IMDUR) 30 MG 24 hr tablet, Take 1 tablet (30 mg total) by mouth daily., Disp: 30 tablet, Rfl: 11 .  lisinopril (PRINIVIL,ZESTRIL) 40 MG tablet, Take 1 tablet (40 mg total) by mouth daily., Disp: 30 tablet, Rfl: 11 .  metFORMIN (GLUCOPHAGE-XR) 500 MG 24 hr tablet, Take 500 mg by mouth 2 (two) times daily. , Disp: , Rfl:   No Known Allergies  Review of Systems  Constitutional: Positive for weight loss (desired). Negative for fever, chills and malaise/fatigue.  HENT: Negative for hearing loss and nosebleeds.    Eyes: Negative for blurred vision and double vision.  Respiratory: Negative for cough, sputum production, shortness of breath and wheezing.   Cardiovascular: Negative for chest pain, palpitations, orthopnea and leg swelling.  Gastrointestinal: Negative for heartburn, nausea, vomiting, abdominal pain, diarrhea and blood in stool.  Genitourinary: Negative for dysuria, urgency and frequency.  Musculoskeletal: Negative for myalgias and joint pain.  Skin: Negative for rash.  Neurological: Negative for dizziness, tingling, tremors, weakness and headaches.  Psychiatric/Behavioral: Negative for depression. The patient is not nervous/anxious.       Objective  Filed Vitals:   08/04/15 1605  BP: 133/84  Pulse: 66  Temp: 97.9 F (36.6 C)  TempSrc: Oral  Resp: 16  Height: 5\' 8"  (1.727 m)  Weight: 160 lb (72.576 kg)     Physical Exam  Constitutional: He is well-developed, well-nourished, and in no distress. No distress.  HENT:  Head: Normocephalic and atraumatic.  Eyes: Conjunctivae and EOM are normal. Pupils are equal, round, and reactive to light. No scleral icterus.  Neck: Normal range of motion. Neck supple. Carotid bruit is not present. No thyromegaly present.  Cardiovascular: Normal rate, regular rhythm, normal heart sounds and intact distal pulses.  Exam reveals no gallop and no friction rub.   No murmur heard. Pulmonary/Chest: Effort normal and  breath sounds normal. No respiratory distress. He has no wheezes. He has no rales.  Abdominal: Soft. Bowel sounds are normal. He exhibits no distension, no abdominal bruit and no mass. There is no tenderness.  Musculoskeletal: He exhibits no edema.  Lymphadenopathy:    He has no cervical adenopathy.  Skin: Skin is warm and dry.  Vitals reviewed.     Recent Results (from the past 2160 hour(s))  POCT HgB A1C     Status: Abnormal   Collection Time: 08/04/15  4:27 PM  Result Value Ref Range   Hemoglobin A1C 6.6      Assessment &  Plan  1. Diabetes type 2, controlled  - POCT HgB A1C- 6.6  2. Essential hypertension

## 2015-11-12 ENCOUNTER — Encounter: Payer: Self-pay | Admitting: Family Medicine

## 2015-11-12 ENCOUNTER — Ambulatory Visit (INDEPENDENT_AMBULATORY_CARE_PROVIDER_SITE_OTHER): Payer: BLUE CROSS/BLUE SHIELD | Admitting: Family Medicine

## 2015-11-12 VITALS — BP 116/75 | HR 69 | Temp 98.6°F | Resp 16 | Ht 69.0 in | Wt 162.4 lb

## 2015-11-12 DIAGNOSIS — N529 Male erectile dysfunction, unspecified: Secondary | ICD-10-CM | POA: Diagnosis not present

## 2015-11-12 DIAGNOSIS — I420 Dilated cardiomyopathy: Secondary | ICD-10-CM | POA: Diagnosis not present

## 2015-11-12 DIAGNOSIS — E119 Type 2 diabetes mellitus without complications: Secondary | ICD-10-CM | POA: Diagnosis not present

## 2015-11-12 DIAGNOSIS — I10 Essential (primary) hypertension: Secondary | ICD-10-CM | POA: Diagnosis not present

## 2015-11-12 DIAGNOSIS — E1136 Type 2 diabetes mellitus with diabetic cataract: Secondary | ICD-10-CM | POA: Insufficient documentation

## 2015-11-12 DIAGNOSIS — E1169 Type 2 diabetes mellitus with other specified complication: Secondary | ICD-10-CM

## 2015-11-12 LAB — POCT GLYCOSYLATED HEMOGLOBIN (HGB A1C): Hemoglobin A1C: 6.6

## 2015-11-12 MED ORDER — CIALIS 20 MG PO TABS: 20.0000 mg | ORAL_TABLET | Freq: Every day | ORAL | Status: DC

## 2015-11-12 NOTE — Patient Instructions (Signed)
Continue to watch diet 

## 2015-11-12 NOTE — Progress Notes (Signed)
Name: Russell [name].   MRN: [id number]    DOB: [birth year]   Date:11/12/2015       Progress Note  Subjective  Chief Complaint  Chief Complaint  Patient presents with  . Diabetes    3 month follow up highest 138 and lowest 120 and avarage 125    HPI  Here for f/u of DM and HBP.  BS s at home 115-130.  BPs 120/80 avg.  Taking all meds ands feeling well. No problem-specific assessment & plan notes found for this encounter.   Past Medical History  Diagnosis Date  . Diabetes mellitus without complication (HCC)   . Hypertension   . Impotence of organic origin   . Unspecified vitamin D deficiency   . Unspecified essential hypertension   . Type II or unspecified type diabetes mellitus without mention of complication, not stated as uncontrolled   . Personal history of colonic polyps   . Pain in joint, shoulder region     Past Surgical History  Procedure Laterality Date  . Colonoscopy  2009  . Cardiac catheterization  11/17/2013    Family History  Problem Relation Age of Onset  . Colon cancer Father     1970's  . Hypertension Mother   . Diabetes Mother     Social History   Social History  . Marital Status: Married    Spouse Name: N/A  . Number of Children: N/A  . Years of Education: N/A   Occupational History  . Not on file.   Social History Main Topics  . Smoking status: Never Smoker   . Smokeless tobacco: Never Used  . Alcohol Use: Yes     Comment: occassional  . Drug Use: No  . Sexual Activity: Not on file   Other Topics Concern  . Not on file   Social History Narrative     Current outpatient prescriptions:  .  Acetaminophen (TYLENOL PO), Take by mouth as needed., Disp: , Rfl:  .  amLODipine (NORVASC) 10 MG tablet, , Disp: , Rfl: 1 .  carvedilol (COREG) 25 MG tablet, Take 1 tablet (25 mg total) by mouth 2 (two) times daily., Disp: 60 tablet, Rfl: 11 .  cholecalciferol (VITAMIN D) 1000 UNITS tablet, Take 1,000 Units by mouth 2 (two) times daily., Disp:  , Rfl:  .  CIALIS 20 MG tablet, Take 1 tablet (20 mg total) by mouth daily., Disp: 6 tablet, Rfl: 6 .  lisinopril (PRINIVIL,ZESTRIL) 40 MG tablet, Take 1 tablet (40 mg total) by mouth daily., Disp: 30 tablet, Rfl: 11 .  metFORMIN (GLUCOPHAGE-XR) 500 MG 24 hr tablet, Take 500 mg by mouth 2 (two) times daily. , Disp: , Rfl:   No Known Allergies   Review of Systems  Constitutional: Negative for fever, chills, weight loss and malaise/fatigue.  HENT: Negative for hearing loss.   Eyes: Negative for blurred vision and double vision.  Respiratory: Negative for cough, shortness of breath and wheezing.   Cardiovascular: Negative for chest pain, palpitations and leg swelling.  Gastrointestinal: Negative for heartburn, abdominal pain and blood in stool.  Genitourinary: Negative for dysuria, urgency and frequency.  Musculoskeletal: Negative for myalgias and joint pain.  Skin: Negative for rash.  Neurological: Negative for dizziness, tremors, weakness and headaches.      Objective  Filed Vitals:   11/12/15 1533  BP: 116/75  Pulse: 69  Temp: 98.6 F (37 C)  TempSrc: Oral  Resp: 16  Height:  (1.753 m)  Weight: 162 lb 6.4  oz (73.664 kg)    Physical Exam  Constitutional: He is oriented to person, place, and time and well-developed, well-nourished, and in no distress. No distress.  HENT:  Head: Normocephalic and atraumatic.  Eyes: Conjunctivae and EOM are normal. Pupils are equal, round, and reactive to light.  Neck: Normal range of motion. Neck supple. Carotid bruit is not present. No thyromegaly present.  Cardiovascular: Normal rate, regular rhythm and intact distal pulses.  Exam reveals no gallop and no friction rub.   No murmur heard. Pulmonary/Chest: Effort normal and breath sounds normal. No respiratory distress. He has no wheezes. He has no rales.  Abdominal: Soft. Bowel sounds are normal. He exhibits no distension, no abdominal bruit and no mass. There is no tenderness.   Musculoskeletal: He exhibits no edema.  Lymphadenopathy:    He has no cervical adenopathy.  Neurological: He is alert and oriented to person, place, and time.  Vitals reviewed.      Recent Results (from the past 2160 hour(s))  POCT HgB A1C     Status: Abnormal   Collection Time: 11/12/15  3:37 PM  Result Value Ref Range   Hemoglobin A1C 6.6      Assessment & Plan  Problem List Items Addressed This Visit      Cardiovascular and Mediastinum   Essential hypertension   Relevant Medications   amLODipine (NORVASC) 10 MG tablet   CIALIS 20 MG tablet   Congestive dilated cardiomyopathy (HCC)   Relevant Medications   amLODipine (NORVASC) 10 MG tablet   CIALIS 20 MG tablet     Endocrine   Type 2 diabetes mellitus without complication, without long-term current use of insulin (HCC) - Primary   Relevant Orders   POCT HgB A1C (Completed)     Genitourinary   Erectile dysfunction   Relevant Medications   CIALIS 20 MG tablet      Meds ordered this encounter  Medications  . amLODipine (NORVASC) 10 MG tablet    Sig:     Refill:  1  . CIALIS 20 MG tablet    Sig: Take 1 tablet (20 mg total) by mouth daily.    Dispense:  6 tablet    Refill:  6   1. Type 2 diabetes mellitus without complication, without long-term current use of insulin (HCC) -cont. Metformin - POCT HgB A1C-6.6 2. Erectile dysfunction, unspecified erectile dysfunction type  - CIALIS 20 MG tablet; Take 1 tablet (20 mg total) by mouth daily.  Dispense: 6 tablet; Refill: 6  3. Essential hypertension -cont meds except discontinue Imdur.  4. Congestive dilated cardiomyopathy (HCC)

## 2016-02-15 LAB — BASIC METABOLIC PANEL
Glucose: 129 mg/dL
Potassium: 4.3 mmol/L (ref 3.4–5.3)

## 2016-02-15 LAB — PSA: PSA: 4.7

## 2016-02-15 LAB — HEMOGLOBIN A1C: Hemoglobin A1C: 6.6

## 2016-02-15 LAB — TSH: TSH: 1.19 u[IU]/mL (ref <=5.90)

## 2016-02-15 LAB — HEPATIC FUNCTION PANEL
ALT: 12 U/L (ref 10–40)
AST: 13 U/L — AB (ref 14–40)

## 2016-02-17 ENCOUNTER — Encounter: Payer: Self-pay | Admitting: Family Medicine

## 2016-02-17 ENCOUNTER — Ambulatory Visit (INDEPENDENT_AMBULATORY_CARE_PROVIDER_SITE_OTHER): Payer: BLUE CROSS/BLUE SHIELD | Admitting: Family Medicine

## 2016-02-17 VITALS — BP 160/90 | HR 68 | Resp 16 | Ht 69.0 in | Wt 158.0 lb

## 2016-02-17 DIAGNOSIS — E119 Type 2 diabetes mellitus without complications: Secondary | ICD-10-CM

## 2016-02-17 DIAGNOSIS — I10 Essential (primary) hypertension: Secondary | ICD-10-CM | POA: Diagnosis not present

## 2016-02-17 DIAGNOSIS — R972 Elevated prostate specific antigen [PSA]: Secondary | ICD-10-CM | POA: Diagnosis not present

## 2016-02-17 MED ORDER — CHLORTHALIDONE 25 MG PO TABS
25.0000 mg | ORAL_TABLET | Freq: Every day | ORAL | Status: DC
Start: 2016-02-17 — End: 2016-03-30

## 2016-02-17 NOTE — Progress Notes (Signed)
Name: Russell [name].   MRN: [id number]    DOB: January 29, 1951   Date:02/17/2016       Progress Note  Subjective  Chief Complaint  Chief Complaint  Patient presents with  . Diabetes    BS 130-136  A1C 6.6 02/15/2016       HPI Here for f/u of HBP and DM.  His A1c is 6.6.  Labs however show a PSA of 4.7.  He has never seen a Insurance underwriter.  Overall feels pretty well.  Does c/o some "nerverousness" when driving on the [place]. No problem-specific assessment & plan notes found for this encounter.   Past Medical History  Diagnosis Date  . Diabetes mellitus without complication (HCC)   . Hypertension   . Impotence of organic origin   . Unspecified vitamin D deficiency   . Unspecified essential hypertension   . Type II or unspecified type diabetes mellitus without mention of complication, not stated as uncontrolled   . Personal history of colonic polyps   . Pain in joint, shoulder region     Past Surgical History  Procedure Laterality Date  . Colonoscopy  2009  . Cardiac catheterization  11/17/2013    Family History  Problem Relation Age of Onset  . Colon cancer Father     1970's  . Hypertension Mother   . Diabetes Mother     Social History   Social History  . Marital Status: Married    Spouse Name: N/A  . Number of Children: N/A  . Years of Education: N/A   Occupational History  . Not on file.   Social History Main Topics  . Smoking status: Never Smoker   . Smokeless tobacco: Never Used  . Alcohol Use: Yes     Comment: occassional  . Drug Use: No  . Sexual Activity: Not on file   Other Topics Concern  . Not on file   Social History Narrative     Current outpatient prescriptions:  .  Acetaminophen (TYLENOL PO), Take by mouth as needed., Disp: , Rfl:  .  amLODipine (NORVASC) 10 MG tablet, , Disp: , Rfl: 1 .  carvedilol (COREG) 25 MG tablet, Take 1 tablet (25 mg total) by mouth 2 (two) times daily., Disp: 60 tablet, Rfl: 11 .  cholecalciferol (VITAMIN D) 1000  UNITS tablet, Take 1,000 Units by mouth 2 (two) times daily., Disp: , Rfl:  .  CIALIS 20 MG tablet, Take 1 tablet (20 mg total) by mouth daily., Disp: 6 tablet, Rfl: 6 .  lisinopril (PRINIVIL,ZESTRIL) 40 MG tablet, Take 1 tablet (40 mg total) by mouth daily., Disp: 30 tablet, Rfl: 11 .  metFORMIN (GLUCOPHAGE-XR) 500 MG 24 hr tablet, Take 500 mg by mouth 2 (two) times daily. , Disp: , Rfl:  .  chlorthalidone (HYGROTON) 25 MG tablet, Take 1 tablet (25 mg total) by mouth daily., Disp: 30 tablet, Rfl: 6  No Known Allergies   Review of Systems  Constitutional: Negative for fever, chills, weight loss and malaise/fatigue.  HENT: Negative for hearing loss.   Eyes: Negative for blurred vision and double vision.  Respiratory: Negative for cough, shortness of breath and wheezing.   Cardiovascular: Negative for chest pain, palpitations and leg swelling.  Gastrointestinal: Negative for heartburn, abdominal pain and blood in stool.  Genitourinary: Negative for dysuria, urgency and frequency.  Skin: Negative for rash.  Neurological: Negative for tremors, weakness and headaches.  Psychiatric/Behavioral: Negative for depression. The patient is nervous/anxious (when driving on interstate.).  Objective  Filed Vitals:   02/17/16 1538 02/17/16 1539 02/17/16 1558  BP: 163/101 160/101 160/90  Pulse:  68   Resp:  16   Height:   (1.753 m)   Weight:  158 lb (71.668 kg)   SpO2:  100%     Physical Exam  Constitutional: He is oriented to person, place, and time and well-developed, well-nourished, and in no distress. No distress.  HENT:  Head: Normocephalic and atraumatic.  Eyes: Conjunctivae and EOM are normal. Pupils are equal, round, and reactive to light. No scleral icterus.  Neck: Normal range of motion. Neck supple. Carotid bruit is not present. No thyromegaly present.  Cardiovascular: Normal rate, regular rhythm and normal heart sounds.  Exam reveals no gallop and no friction rub.   No  murmur heard. Pulmonary/Chest: Effort normal and breath sounds normal. No respiratory distress. He has no wheezes. He has no rales.  Abdominal: Soft. Bowel sounds are normal. He exhibits no distension and no abdominal bruit. There is no tenderness. There is no rebound.  Genitourinary: Prostate normal.  Musculoskeletal: He exhibits no edema.  Lymphadenopathy:    He has no cervical adenopathy.  Neurological: He is alert and oriented to person, place, and time.  Vitals reviewed.      No results found for this or any previous visit (from the past 2160 hour(s)).   Assessment & Plan  Problem List Items Addressed This Visit      Cardiovascular and Mediastinum   Essential hypertension - Primary   Relevant Medications   chlorthalidone (HYGROTON) 25 MG tablet     Endocrine   Type 2 diabetes mellitus without complication, without long-term current use of insulin (HCC)     Other   Elevated PSA   Relevant Orders   Ambulatory referral to Urology      Meds ordered this encounter  Medications  . chlorthalidone (HYGROTON) 25 MG tablet    Sig: Take 1 tablet (25 mg total) by mouth daily.    Dispense:  30 tablet    Refill:  6   1. Essential hypertension Cont. Amlodipine, Lisinopril, and Carvedilol at current doses. - chlorthalidone (HYGROTON) 25 MG tablet; Take 1 tablet (25 mg total) by mouth daily.  Dispense: 30 tablet; Refill: 6  2. Type 2 diabetes mellitus without complication, without long-term current use of insulin (HCC) Cont. Metformin  3. Elevated PSA  - Ambulatory referral to Urology

## 2016-02-18 ENCOUNTER — Ambulatory Visit (INDEPENDENT_AMBULATORY_CARE_PROVIDER_SITE_OTHER): Payer: BLUE CROSS/BLUE SHIELD | Admitting: Cardiovascular Disease

## 2016-02-18 ENCOUNTER — Ambulatory Visit: Payer: BLUE CROSS/BLUE SHIELD | Admitting: Cardiovascular Disease

## 2016-02-18 ENCOUNTER — Encounter: Payer: Self-pay | Admitting: Cardiovascular Disease

## 2016-02-18 VITALS — BP 188/92 | HR 65 | Ht 68.0 in | Wt 155.8 lb

## 2016-02-18 DIAGNOSIS — I420 Dilated cardiomyopathy: Secondary | ICD-10-CM | POA: Diagnosis not present

## 2016-02-18 DIAGNOSIS — E119 Type 2 diabetes mellitus without complications: Secondary | ICD-10-CM

## 2016-02-18 DIAGNOSIS — R9431 Abnormal electrocardiogram [ECG] [EKG]: Secondary | ICD-10-CM | POA: Diagnosis not present

## 2016-02-18 DIAGNOSIS — I10 Essential (primary) hypertension: Secondary | ICD-10-CM

## 2016-02-18 MED ORDER — LISINOPRIL 40 MG PO TABS: 40.0000 mg | ORAL_TABLET | Freq: Every day | ORAL | Status: DC

## 2016-02-18 MED ORDER — CARVEDILOL 25 MG PO TABS: 25.0000 mg | ORAL_TABLET | Freq: Two times a day (BID) | ORAL | Status: DC

## 2016-02-18 MED ORDER — ISOSORBIDE MONONITRATE ER 30 MG PO TB24: 30.0000 mg | ORAL_TABLET | Freq: Every day | ORAL | Status: DC

## 2016-02-18 NOTE — Progress Notes (Signed)
Patient ID: Russell Joy., male    DOB: Jul 22, 1951, 65 y.o.   MRN: [id number]  HPI Comments: Russell [name] is a very pleasant 65 year old gentleman with a history of diabetes, hypertension, patient of Dr. Juanetta Gosling, who initially presented for abnormal EKG. he had a colonoscopy. During the procedure, noted to have abnormal telemetry. EKG was performed that showed anterolateral wall abnormality. He was referred back to Dr. Juanetta Gosling. EKG with Dr. Juanetta Gosling showed T-wave abnormality in V5 through V6, leads 1 and aVL He had a echocardiogram that showed depressed ejection fraction 25-30%.  pharmacologic Myoview suggested mid to distal anterior wall perfusion defect concerning for ischemia. He was started on beta blocker and ACE inhibitor He underwent cardiac catheterization given the perfusion defect and severely depressed ejection fraction.   this showed no significant CAD, ejection fraction had improved up to 40%. 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 Juanetta Gosling, blood pressure was elevated at that time, 160 systolic. Blood pressure today 180 systolic.  He was previously on isosorbide, she reports this was held and he was started on amlodipine. In last visit amlodipine was held and he was started on chlorthalidone. He continues to take carvedilol, lisinopril.  Denies any pain, no shortness of breath, no fluid retention. Otherwise feels well, busy work schedule  EKG on today's visit shows normal sinus rhythm with rate 65 bpm, T-wave abnormality to the anterior precordial leads, PVC noted  Other past medical history Previously reported symptoms of erectile dysfunction. Previously started on Cialis  No Known Allergies  Outpatient Encounter Prescriptions as of 02/18/2016  Medication Sig  . Acetaminophen (TYLENOL PO) Take by mouth  as needed.  . carvedilol (COREG) 25 MG tablet Take 1 tablet (25 mg total) by mouth 2 (two) times daily.  . chlorthalidone (HYGROTON) 25 MG tablet Take 1 tablet (25 mg total) by mouth daily.  . cholecalciferol (VITAMIN D) 1000 UNITS tablet Take 1,000 Units by mouth 2 (two) times daily.  Marland Kitchen CIALIS 20 MG tablet Take 1 tablet (20 mg total) by mouth daily.  Marland Kitchen lisinopril (PRINIVIL,ZESTRIL) 40 MG tablet Take 1 tablet (40 mg total) by mouth daily.  . metFORMIN (GLUCOPHAGE-XR) 500 MG 24 hr tablet Take 500 mg by mouth 2 (two) times daily.   . [DISCONTINUED] carvedilol (COREG) 25 MG tablet Take 1 tablet (25 mg total) by mouth 2 (two) times daily.  . [DISCONTINUED] lisinopril (PRINIVIL,ZESTRIL) 40 MG tablet Take 1 tablet (40 mg total) by mouth daily.  . isosorbide mononitrate (IMDUR) 30 MG 24 hr tablet Take 1 tablet (30 mg total) by mouth daily.  . [DISCONTINUED] amLODipine (NORVASC) 10 MG tablet Reported on 02/18/2016   No facility-administered encounter medications on file as of 02/18/2016.    Past Medical History  Diagnosis Date  . Diabetes mellitus without complication (HCC)   . Hypertension   . Impotence of organic origin   . Unspecified vitamin D deficiency   . Unspecified essential hypertension   . Type II or unspecified type diabetes mellitus without mention of complication, not stated as uncontrolled   . Personal history of colonic polyps   . Pain in joint, shoulder region     Past Surgical History  Procedure Laterality Date  . Colonoscopy  2009  . Cardiac catheterization  11/17/2013    Social History  reports that he has never smoked. He  has never used smokeless tobacco. He reports that he drinks alcohol. He reports that he does not use illicit drugs.  Family History family history includes Colon cancer in his father; Diabetes in his mother; Hypertension in his mother.   Review of Systems  Constitutional: Negative.   Respiratory: Negative.   Cardiovascular: Negative.    Gastrointestinal: Negative.   Musculoskeletal: Negative.   Skin: Negative.   Neurological: Negative.   Hematological: Negative.   Psychiatric/Behavioral: Negative.   All other systems reviewed and are negative.   BP 188/92 mmHg  Pulse 65  Ht  (1.727 m)  Wt 155 lb 12 oz (70.648 kg)  BMI 23.69 kg/m2  Physical Exam  Constitutional: He is oriented to person, place, and time. He appears well-developed and well-nourished.  HENT:  Head: Normocephalic.  Nose: Nose normal.  Mouth/Throat: Oropharynx is clear and moist.  Eyes: Conjunctivae are normal. Pupils are equal, round, and reactive to light.  Neck: Normal range of motion. Neck supple. No JVD present.  Cardiovascular: Normal rate, regular rhythm, S1 normal, S2 normal, normal heart sounds and intact distal pulses.  Exam reveals no gallop and no friction rub.   No murmur heard. Pulmonary/Chest: Effort normal and breath sounds normal. No respiratory distress. He has no wheezes. He has no rales. He exhibits no tenderness.  Abdominal: Soft. Bowel sounds are normal. He exhibits no distension. There is no tenderness.  Musculoskeletal: Normal range of motion. He exhibits no edema or tenderness.  Lymphadenopathy:    He has no cervical adenopathy.  Neurological: He is alert and oriented to person, place, and time. Coordination normal.  Skin: Skin is warm and dry. No rash noted. No erythema.  Psychiatric: He has a normal mood and affect. His behavior is normal. Judgment and thought content normal.      Assessment and Plan   Nursing note and vitals reviewed.

## 2016-02-18 NOTE — Patient Instructions (Signed)
You are doing well.  Blood pressure is elevated: Please restart the isosorbide one a day Monitor your blood pressure  If it continues to run high, >140 Please restart 5 mg up to 10 mg  Amlodipine  Call the office in the next few weeks with blood pressure numbers   Please call us if you have new issues that need to be addressed before your next appt.  Your physician wants you to follow-up in: 3 months.  You will receive a reminder letter in the mail two months in advance. If you don't receive a letter, please call our office to schedule the follow-up appointment.

## 2016-02-18 NOTE — Assessment & Plan Note (Signed)
Unclear why his blood pressure is so high on recent visit with Dr. Juanetta Gosling and again today. Otherwise he feels well with no complaints. Recommended he restart his isosorbide mononitrate 30 MG daily. He reports when he was taking this previously, systolic pressures were in the 120-130 range. If blood pressure does not improve in the next several days, recommended he start amlodipine 5 mg up to 10 mg if needed.  Recommended he call our office next week If he has continued difficulty with his blood pressure, may need to consider renal artery ultrasound

## 2016-02-18 NOTE — Assessment & Plan Note (Signed)
We did discuss possibly repeating echocardiogram to check his ejection fraction, we'll hold off for now in the setting of  Hypertension. If ejection fraction stays 40% or less, he may benefit from entresto bid. Appears relatively euvolemic on today's visit

## 2016-02-18 NOTE — Assessment & Plan Note (Addendum)
Managed by Dr. Juanetta Gosling, hemoglobin A1c 6.6   Total encounter time more than 25 minutes  Greater than 50% was spent in counseling and coordination of care with the patient

## 2016-03-02 LAB — PSA: PSA: 3.8

## 2016-03-06 ENCOUNTER — Ambulatory Visit: Payer: BLUE CROSS/BLUE SHIELD

## 2016-03-30 ENCOUNTER — Ambulatory Visit (INDEPENDENT_AMBULATORY_CARE_PROVIDER_SITE_OTHER): Payer: BLUE CROSS/BLUE SHIELD | Admitting: Family Medicine

## 2016-03-30 ENCOUNTER — Encounter: Payer: Self-pay | Admitting: Family Medicine

## 2016-03-30 VITALS — BP 136/75 | HR 75 | Resp 16 | Ht 69.0 in | Wt 154.2 lb

## 2016-03-30 DIAGNOSIS — F409 Phobic anxiety disorder, unspecified: Secondary | ICD-10-CM | POA: Diagnosis not present

## 2016-03-30 DIAGNOSIS — E119 Type 2 diabetes mellitus without complications: Secondary | ICD-10-CM

## 2016-03-30 DIAGNOSIS — I10 Essential (primary) hypertension: Secondary | ICD-10-CM

## 2016-03-30 MED ORDER — FLUOXETINE HCL 20 MG PO TABS: 20.0000 mg | ORAL_TABLET | Freq: Every day | ORAL | Status: DC

## 2016-03-30 NOTE — Progress Notes (Signed)
Name: Russell [name].   MRN: [id number]    DOB: [birth year]   Date:03/30/2016       Progress Note  Subjective  Chief Complaint  Chief Complaint  Patient presents with  . Hypertension  . Diabetes    HPI Here for f/u of HBP and DM.  He had stopped his diuretic because he said that it made him sick   He states that his BPs at home run 130-135 sys, and  BSs at home running 130 fasting.   He is feeling well.  HE still c/o anxiety when driving on the interstate only.  Doesn't bother hoim any other time.  Has to get off interstate of have someone else drive.  No problem-specific assessment & plan notes found for this encounter.   Past Medical History  Diagnosis Date  . Diabetes mellitus without complication (HCC)   . Hypertension   . Impotence of organic origin   . Unspecified vitamin D deficiency   . Unspecified essential hypertension   . Type II or unspecified type diabetes mellitus without mention of complication, not stated as uncontrolled   . Personal history of colonic polyps   . Pain in joint, shoulder region     Past Surgical History  Procedure Laterality Date  . Colonoscopy  2009  . Cardiac catheterization  11/17/2013    Family History  Problem Relation Age of Onset  . Colon cancer Father     1970's  . Hypertension Mother   . Diabetes Mother     Social History   Social History  . Marital Status: Married    Spouse Name: N/A  . Number of Children: N/A  . Years of Education: N/A   Occupational History  . Not on file.   Social History Main Topics  . Smoking status: Never Smoker   . Smokeless tobacco: Never Used  . Alcohol Use: Yes     Comment: occassional  . Drug Use: No  . Sexual Activity: Not on file   Other Topics Concern  . Not on file   Social History Narrative     Current outpatient prescriptions:  .  Acetaminophen (TYLENOL PO), Take by mouth as needed., Disp: , Rfl:  .  amLODipine (NORVASC) 10 MG tablet, Take 10 mg by mouth daily., Disp: , Rfl:   .  carvedilol (COREG) 25 MG tablet, Take 1 tablet (25 mg total) by mouth 2 (two) times daily., Disp: 60 tablet, Rfl: 11 .  Cholecalciferol (VITAMIN D3) 2000 units TABS, Take by mouth., Disp: , Rfl:  .  lisinopril (PRINIVIL,ZESTRIL) 40 MG tablet, Take 1 tablet (40 mg total) by mouth daily., Disp: 30 tablet, Rfl: 11 .  metFORMIN (GLUCOPHAGE-XR) 500 MG 24 hr tablet, Take 500 mg by mouth 2 (two) times daily. , Disp: , Rfl:  .  FLUoxetine (PROZAC) 20 MG tablet, Take 1 tablet (20 mg total) by mouth daily., Disp: 30 tablet, Rfl: 6  No Known Allergies   Review of Systems  Constitutional: Negative for fever, chills, weight loss and malaise/fatigue.  HENT: Negative for hearing loss.   Eyes: Negative for blurred vision and double vision.  Respiratory: Negative for cough, shortness of breath and wheezing.   Cardiovascular: Negative for chest pain, palpitations and leg swelling.  Gastrointestinal: Negative for heartburn, abdominal pain and blood in stool.  Genitourinary: Negative for dysuria, urgency and frequency.  Skin: Negative for rash.  Neurological: Negative for weakness and headaches.  Psychiatric/Behavioral: Hallucinations: anxiety when driving on the interstate over the past  10-12 months. The patient is nervous/anxious.       Objective  Filed Vitals:   03/30/16 1601  BP: 136/75  Pulse: 75  Resp: 16  Height:  (1.753 m)  Weight: 154 lb 3.2 oz (69.945 kg)    Physical Exam  Constitutional: He is well-developed, well-nourished, and in no distress. No distress.  HENT:  Head: Normocephalic and atraumatic.  Eyes: Conjunctivae are normal. Pupils are equal, round, and reactive to light. No scleral icterus.  Neck: Normal range of motion. Neck supple. Carotid bruit is not present. No thyromegaly present.  Cardiovascular: Normal rate, regular rhythm and normal heart sounds.  Exam reveals no gallop and no friction rub.   No murmur heard. Pulmonary/Chest: Effort normal and breath  sounds normal. No respiratory distress. He has no wheezes. He exhibits no tenderness.  Abdominal: Soft. Bowel sounds are normal. He exhibits no distension and no mass. There is no tenderness.  Musculoskeletal: He exhibits no edema.  Lymphadenopathy:    He has no cervical adenopathy.  Neurological: He is alert.  Psychiatric: Mood, memory, affect and judgment normal.  Vitals reviewed.      Recent Results (from the past 2160 hour(s))  PSA     Status: None   Collection Time: 03/02/16 12:00 AM  Result Value Ref Range   PSA 3.8      Assessment & Plan  Problem List Items Addressed This Visit      Cardiovascular and Mediastinum   Essential hypertension - Primary   Relevant Medications   amLODipine (NORVASC) 10 MG tablet     Endocrine   Type 2 diabetes mellitus without complication, without long-term current use of insulin (HCC)     Other   Phobia   Relevant Medications   FLUoxetine (PROZAC) 20 MG tablet      Meds ordered this encounter  Medications  . DISCONTD: VIAGRA 100 MG tablet    Sig: See admin instructions.    Refill:  5  . Cholecalciferol (VITAMIN D3) 2000 units TABS    Sig: Take by mouth.  Marland Kitchen amLODipine (NORVASC) 10 MG tablet    Sig: Take 10 mg by mouth daily.  Marland Kitchen FLUoxetine (PROZAC) 20 MG tablet    Sig: Take 1 tablet (20 mg total) by mouth daily.    Dispense:  30 tablet    Refill:  6   1. Phobia  - FLUoxetine (PROZAC) 20 MG tablet; Take 1 tablet (20 mg total) by mouth daily.  Dispense: 30 tablet; Refill: 6  2. Essential hypertension Cont Coreg, Lisinopril, Amlodipine  3. Type 2 diabetes mellitus without complication, without long-term current use of insulin (HCC) Cont. Metformin

## 2016-04-19 ENCOUNTER — Other Ambulatory Visit: Payer: Self-pay

## 2016-04-25 ENCOUNTER — Other Ambulatory Visit: Payer: Self-pay | Admitting: Family Medicine

## 2016-04-25 MED ORDER — AMLODIPINE BESYLATE 10 MG PO TABS: 10.0000 mg | ORAL_TABLET | Freq: Every day | ORAL | Status: DC

## 2016-05-12 ENCOUNTER — Other Ambulatory Visit: Payer: Self-pay | Admitting: Family Medicine

## 2016-05-12 MED ORDER — METFORMIN HCL ER 500 MG PO TB24: 500.0000 mg | ORAL_TABLET | Freq: Two times a day (BID) | ORAL | Status: DC

## 2016-05-17 ENCOUNTER — Ambulatory Visit: Payer: BLUE CROSS/BLUE SHIELD | Admitting: Cardiovascular Disease

## 2016-05-17 ENCOUNTER — Encounter: Payer: Self-pay | Admitting: *Deleted

## 2016-07-13 ENCOUNTER — Ambulatory Visit: Payer: BLUE CROSS/BLUE SHIELD | Admitting: Family Medicine

## 2016-07-24 ENCOUNTER — Encounter: Payer: Self-pay | Admitting: Family Medicine

## 2016-07-24 ENCOUNTER — Ambulatory Visit (INDEPENDENT_AMBULATORY_CARE_PROVIDER_SITE_OTHER): Payer: BLUE CROSS/BLUE SHIELD | Admitting: Family Medicine

## 2016-07-24 VITALS — BP 121/70 | HR 69 | Temp 98.8°F | Resp 16 | Ht 69.0 in | Wt 157.0 lb

## 2016-07-24 DIAGNOSIS — E119 Type 2 diabetes mellitus without complications: Secondary | ICD-10-CM | POA: Diagnosis not present

## 2016-07-24 DIAGNOSIS — I10 Essential (primary) hypertension: Secondary | ICD-10-CM

## 2016-07-24 LAB — POCT GLYCOSYLATED HEMOGLOBIN (HGB A1C)

## 2016-07-24 NOTE — Patient Instructions (Signed)
Plan PSA, CMP, CBC and Lipids on return.

## 2016-07-24 NOTE — Progress Notes (Signed)
Name: Russell [name].   MRN: [id number]    DOB: Oct 16, 1951   Date:07/24/2016       Progress Note  Subjective  Chief Complaint  Chief Complaint  Patient presents with  . Hypertension  . Diabetes    HPI Here for f/u of HBP and DM.  Taking all meds.  Feeling well.  No c/o.  BSs at home in 120s usually. No problem-specific Assessment & Plan notes found for this encounter.   Past Medical History:  Diagnosis Date  . Diabetes mellitus without complication (HCC)   . Hypertension   . Impotence of organic origin   . Pain in joint, shoulder region   . Personal history of colonic polyps   . Type II or unspecified type diabetes mellitus without mention of complication, not stated as uncontrolled   . Unspecified essential hypertension   . Unspecified vitamin D deficiency     Past Surgical History:  Procedure Laterality Date  . CARDIAC CATHETERIZATION  11/17/2013  . COLONOSCOPY  2009    Family History  Problem Relation Age of Onset  . Colon cancer Father     1970's  . Hypertension Mother   . Diabetes Mother     Social History   Social History  . Marital status: Married    Spouse name: N/A  . Number of children: N/A  . Years of education: N/A   Occupational History  . Not on file.   Social History Main Topics  . Smoking status: Never Smoker  . Smokeless tobacco: Never Used  . Alcohol use Yes     Comment: occassional  . Drug use: No  . Sexual activity: Not on file   Other Topics Concern  . Not on file   Social History Narrative  . No narrative on file     Current Outpatient Prescriptions:  .  Acetaminophen (TYLENOL PO), Take by mouth as needed., Disp: , Rfl:  .  amLODipine (NORVASC) 10 MG tablet, Take 1 tablet (10 mg total) by mouth daily., Disp: 30 tablet, Rfl: 12 .  carvedilol (COREG) 25 MG tablet, Take 1 tablet (25 mg total) by mouth 2 (two) times daily., Disp: 60 tablet, Rfl: 11 .  Cholecalciferol (VITAMIN D3) 2000 units TABS, Take by mouth., Disp: , Rfl:  .   FLUoxetine (PROZAC) 20 MG tablet, Take 1 tablet (20 mg total) by mouth daily., Disp: 30 tablet, Rfl: 6 .  lisinopril (PRINIVIL,ZESTRIL) 40 MG tablet, Take 1 tablet (40 mg total) by mouth daily., Disp: 30 tablet, Rfl: 11 .  metFORMIN (GLUCOPHAGE-XR) 500 MG 24 hr tablet, Take 1 tablet (500 mg total) by mouth 2 (two) times daily., Disp: 60 tablet, Rfl: 6 .  VIAGRA 100 MG tablet, Take 100 mg by mouth See admin instructions., Disp: , Rfl: 5  Not on File   Review of Systems  Constitutional: Negative for chills, fever, malaise/fatigue and weight loss.  HENT: Negative for hearing loss.   Eyes: Negative for blurred vision and double vision.  Respiratory: Negative for cough, shortness of breath and wheezing.   Cardiovascular: Negative for chest pain, palpitations and leg swelling.  Gastrointestinal: Negative for abdominal pain, blood in stool and heartburn.  Genitourinary: Negative for dysuria, frequency and urgency.  Skin: Negative for rash.  Neurological: Negative for dizziness, tremors, weakness and headaches.      Objective  Vitals:   07/24/16 1553  BP: 121/70  Pulse: 69  Resp: 16  Temp: 98.8 F (37.1 C)  TempSrc: Oral  Weight: 157  lb (71.2 kg)  Height:  (1.753 m)    Physical Exam  Constitutional: He is oriented to person, place, and time and well-developed, well-nourished, and in no distress. No distress.  HENT:  Head: Normocephalic and atraumatic.  Eyes: Conjunctivae and EOM are normal. Pupils are equal, round, and reactive to light. No scleral icterus.  Neck: Normal range of motion. Neck supple. Carotid bruit is not present. No thyromegaly present.  Cardiovascular: Normal rate, regular rhythm and normal heart sounds.  Exam reveals no gallop and no friction rub.   No murmur heard. Pulmonary/Chest: Effort normal and breath sounds normal. No respiratory distress. He has no wheezes. He has no rales.  Abdominal: Soft. Bowel sounds are normal. He exhibits no distension and  no mass. There is no tenderness.  Musculoskeletal: He exhibits no edema.  Lymphadenopathy:    He has no cervical adenopathy.  Neurological: He is alert and oriented to person, place, and time.  Vitals reviewed.      Recent Results (from the past 2160 hour(s))  POCT HgB A1C     Status: Abnormal   Collection Time: 07/24/16  4:19 PM  Result Value Ref Range   Hemoglobin A1C 6.6%      Assessment & Plan  Problem List Items Addressed This Visit      Cardiovascular and Mediastinum   Essential hypertension   Relevant Medications   VIAGRA 100 MG tablet     Endocrine   Type 2 diabetes mellitus without complication, without long-term current use of insulin (HCC) - Primary   Relevant Orders   POCT HgB A1C (Completed)    Other Visit Diagnoses   None.     Meds ordered this encounter  Medications  . VIAGRA 100 MG tablet    Sig: Take 100 mg by mouth See admin instructions.    Refill:  5   1. Type 2 diabetes mellitus without complication, without long-term current use of insulin (HCC) Cont Metformin - POCT HgB A1C-6.6  2. Essential hypertension Cont. Lisinopril, Amlodipine, Coreg.

## 2016-12-11 ENCOUNTER — Ambulatory Visit (INDEPENDENT_AMBULATORY_CARE_PROVIDER_SITE_OTHER): Payer: BLUE CROSS/BLUE SHIELD | Admitting: Family Medicine

## 2016-12-11 ENCOUNTER — Encounter: Payer: Self-pay | Admitting: Family Medicine

## 2016-12-11 VITALS — BP 122/71 | HR 66 | Temp 98.7°F | Resp 16 | Ht 69.0 in | Wt 156.0 lb

## 2016-12-11 DIAGNOSIS — E119 Type 2 diabetes mellitus without complications: Secondary | ICD-10-CM | POA: Diagnosis not present

## 2016-12-11 DIAGNOSIS — E782 Mixed hyperlipidemia: Secondary | ICD-10-CM

## 2016-12-11 DIAGNOSIS — I10 Essential (primary) hypertension: Secondary | ICD-10-CM

## 2016-12-11 DIAGNOSIS — I420 Dilated cardiomyopathy: Secondary | ICD-10-CM | POA: Diagnosis not present

## 2016-12-11 DIAGNOSIS — F408 Other phobic anxiety disorders: Secondary | ICD-10-CM | POA: Diagnosis not present

## 2016-12-11 LAB — POCT GLYCOSYLATED HEMOGLOBIN (HGB A1C): Hemoglobin A1C: 6.7

## 2016-12-11 MED ORDER — LISINOPRIL 40 MG PO TABS
40.0000 mg | ORAL_TABLET | Freq: Every day | ORAL | 11 refills | Status: DC
Start: 2016-12-11 — End: 2017-02-06

## 2016-12-11 MED ORDER — AMLODIPINE BESYLATE 10 MG PO TABS: 10.0000 mg | ORAL_TABLET | Freq: Every day | ORAL | 12 refills | Status: DC

## 2016-12-11 MED ORDER — CARVEDILOL 25 MG PO TABS: 25.0000 mg | ORAL_TABLET | Freq: Two times a day (BID) | ORAL | 11 refills | Status: DC

## 2016-12-11 MED ORDER — METFORMIN HCL ER 500 MG PO TB24: 500.0000 mg | ORAL_TABLET | Freq: Two times a day (BID) | ORAL | 12 refills | Status: DC

## 2016-12-11 NOTE — Progress Notes (Signed)
Name: Russell [name].   MRN: [id number]    DOB: Sep 10, 1951   Date:12/11/2016       Progress Note  Subjective  Chief Complaint  Chief Complaint  Patient presents with  . Hypertension  . Diabetes    HPI Here for f/u of DM and HBP.  Taking meds and feeling well.  Checking BSs at home 130s-140s.  BPs at home 130/80  No problem-specific Assessment & Plan notes found for this encounter.   Past Medical History:  Diagnosis Date  . Diabetes mellitus without complication (HCC)   . Hypertension   . Impotence of organic origin   . Pain in joint, shoulder region   . Personal history of colonic polyps   . Type II or unspecified type diabetes mellitus without mention of complication, not stated as uncontrolled   . Unspecified essential hypertension   . Unspecified vitamin D deficiency     Past Surgical History:  Procedure Laterality Date  . CARDIAC CATHETERIZATION  11/17/2013  . COLONOSCOPY  2009    Family History  Problem Relation Age of Onset  . Colon cancer Father     1970's  . Hypertension Mother   . Diabetes Mother     Social History   Social History  . Marital status: Married    Spouse name: N/A  . Number of children: N/A  . Years of education: N/A   Occupational History  . Not on file.   Social History Main Topics  . Smoking status: Never Smoker  . Smokeless tobacco: Never Used  . Alcohol use Yes     Comment: occassional  . Drug use: No  . Sexual activity: Not on file   Other Topics Concern  . Not on file   Social History Narrative  . No narrative on file     Current Outpatient Prescriptions:  .  Acetaminophen (TYLENOL PO), Take by mouth as needed., Disp: , Rfl:  .  amLODipine (NORVASC) 10 MG tablet, Take 1 tablet (10 mg total) by mouth daily., Disp: 30 tablet, Rfl: 12 .  carvedilol (COREG) 25 MG tablet, Take 1 tablet (25 mg total) by mouth 2 (two) times daily., Disp: 60 tablet, Rfl: 11 .  Cholecalciferol (VITAMIN D3) 2000 units TABS, Take by mouth.,  Disp: , Rfl:  .  lisinopril (PRINIVIL,ZESTRIL) 40 MG tablet, Take 1 tablet (40 mg total) by mouth daily., Disp: 30 tablet, Rfl: 11 .  metFORMIN (GLUCOPHAGE-XR) 500 MG 24 hr tablet, Take 1 tablet (500 mg total) by mouth 2 (two) times daily., Disp: 60 tablet, Rfl: 12 .  VIAGRA 100 MG tablet, Take 100 mg by mouth See admin instructions., Disp: , Rfl: 5  Not on File   Review of Systems  Constitutional: Negative for chills, fever, malaise/fatigue and weight loss.  HENT: Negative for hearing loss and tinnitus.   Eyes: Negative for blurred vision and double vision.  Respiratory: Negative for cough, shortness of breath and wheezing.   Cardiovascular: Negative for chest pain, palpitations and leg swelling.  Gastrointestinal: Negative for abdominal pain, blood in stool and heartburn.  Genitourinary: Negative for dysuria, frequency and urgency.  Musculoskeletal: Negative for joint pain and myalgias.  Skin: Negative for itching.  Neurological: Negative for dizziness, tingling, tremors, weakness and headaches.      Objective  Vitals:   12/11/16 1603  BP: 122/71  Pulse: 66  Resp: 16  Temp: 98.7 F (37.1 C)  TempSrc: Oral  Weight: 156 lb (70.8 kg)  Height: 5\' 9"  (1.753 m)  Physical Exam  Constitutional: He is oriented to person, place, and time and well-developed, well-nourished, and in no distress. No distress.  HENT:  Head: Normocephalic and atraumatic.  Eyes: Conjunctivae and EOM are normal. Pupils are equal, round, and reactive to light. No scleral icterus.  Neck: Normal range of motion. Neck supple. Carotid bruit is not present. No thyromegaly present.  Cardiovascular: Normal rate, regular rhythm and normal heart sounds.  Exam reveals no gallop and no friction rub.   No murmur heard. Pulmonary/Chest: Effort normal and breath sounds normal. No respiratory distress. He has no wheezes. He has no rales.  Abdominal: Soft. Bowel sounds are normal. There is no tenderness.   Musculoskeletal: He exhibits no edema.  Lymphadenopathy:    He has no cervical adenopathy.  Neurological: He is alert and oriented to person, place, and time.  Vitals reviewed.      Recent Results (from the past 2160 hour(s))  POCT HgB A1C     Status: Abnormal   Collection Time: 12/11/16  4:24 PM  Result Value Ref Range   Hemoglobin A1C 6.7%      Assessment & Plan  Problem List Items Addressed This Visit      Cardiovascular and Mediastinum   Essential hypertension   Relevant Medications   lisinopril (PRINIVIL,ZESTRIL) 40 MG tablet   carvedilol (COREG) 25 MG tablet   amLODipine (NORVASC) 10 MG tablet   Other Relevant Orders   COMPLETE METABOLIC PANEL WITH GFR   Congestive dilated cardiomyopathy (HCC)   Relevant Medications   lisinopril (PRINIVIL,ZESTRIL) 40 MG tablet   carvedilol (COREG) 25 MG tablet   amLODipine (NORVASC) 10 MG tablet   Other Relevant Orders   CBC with Differential     Endocrine   Type 2 diabetes mellitus without complication, without long-term current use of insulin (HCC)   Relevant Medications   metFORMIN (GLUCOPHAGE-XR) 500 MG 24 hr tablet   lisinopril (PRINIVIL,ZESTRIL) 40 MG tablet     Other   Phobia    Other Visit Diagnoses    Diabetes mellitus without complication (HCC)    -  Primary   Relevant Medications   metFORMIN (GLUCOPHAGE-XR) 500 MG 24 hr tablet   lisinopril (PRINIVIL,ZESTRIL) 40 MG tablet   Other Relevant Orders   POCT HgB A1C (Completed)   Lipid Profile      Meds ordered this encounter  Medications  . metFORMIN (GLUCOPHAGE-XR) 500 MG 24 hr tablet    Sig: Take 1 tablet (500 mg total) by mouth 2 (two) times daily.    Dispense:  60 tablet    Refill:  12  . lisinopril (PRINIVIL,ZESTRIL) 40 MG tablet    Sig: Take 1 tablet (40 mg total) by mouth daily.    Dispense:  30 tablet    Refill:  11  . carvedilol (COREG) 25 MG tablet    Sig: Take 1 tablet (25 mg total) by mouth 2 (two) times daily.    Dispense:  60 tablet     Refill:  11  . amLODipine (NORVASC) 10 MG tablet    Sig: Take 1 tablet (10 mg total) by mouth daily.    Dispense:  30 tablet    Refill:  12   1. Diabetes mellitus without complication (HCC)  - POCT HgB A1C-6.7 - metFORMIN (GLUCOPHAGE-XR) 500 MG 24 hr tablet; Take 1 tablet (500 mg total) by mouth 2 (two) times daily.  Dispense: 60 tablet; Refill: 12 - Lipid Profile  2. Type 2 diabetes mellitus without complication, without long-term current  use of insulin (HCC)   3. Essential hypertension  - lisinopril (PRINIVIL,ZESTRIL) 40 MG tablet; Take 1 tablet (40 mg total) by mouth daily.  Dispense: 30 tablet; Refill: 11 - carvedilol (COREG) 25 MG tablet; Take 1 tablet (25 mg total) by mouth 2 (two) times daily.  Dispense: 60 tablet; Refill: 11 - amLODipine (NORVASC) 10 MG tablet; Take 1 tablet (10 mg total) by mouth daily.  Dispense: 30 tablet; Refill: 12 - COMPLETE METABOLIC PANEL WITH GFR  4. Congestive dilated cardiomyopathy (HCC)  - CBC with Differential  5. Other phobic anxiety disorders

## 2016-12-11 NOTE — Addendum Note (Signed)
Addended by: Alease Frame on: 12/11/2016 04:49 PM   Modules accepted: Orders

## 2016-12-11 NOTE — Addendum Note (Signed)
Addended by: Elvina Mattes D on: 12/11/2016 04:44 PM   Modules accepted: Orders

## 2017-02-06 ENCOUNTER — Ambulatory Visit (INDEPENDENT_AMBULATORY_CARE_PROVIDER_SITE_OTHER): Payer: BLUE CROSS/BLUE SHIELD | Admitting: Cardiovascular Disease

## 2017-02-06 ENCOUNTER — Encounter: Payer: Self-pay | Admitting: Cardiovascular Disease

## 2017-02-06 VITALS — BP 132/82 | HR 67 | Ht 68.0 in | Wt 159.5 lb

## 2017-02-06 DIAGNOSIS — N528 Other male erectile dysfunction: Secondary | ICD-10-CM

## 2017-02-06 DIAGNOSIS — I10 Essential (primary) hypertension: Secondary | ICD-10-CM | POA: Diagnosis not present

## 2017-02-06 DIAGNOSIS — I420 Dilated cardiomyopathy: Secondary | ICD-10-CM

## 2017-02-06 DIAGNOSIS — E119 Type 2 diabetes mellitus without complications: Secondary | ICD-10-CM

## 2017-02-06 MED ORDER — SILDENAFIL CITRATE 20 MG PO TABS: 20.0000 mg | ORAL_TABLET | Freq: Three times a day (TID) | ORAL | 3 refills | Status: DC | PRN

## 2017-02-06 MED ORDER — LISINOPRIL 40 MG PO TABS: 40.0000 mg | ORAL_TABLET | Freq: Every day | ORAL | 3 refills | Status: DC

## 2017-02-06 MED ORDER — AMLODIPINE BESYLATE 10 MG PO TABS: 10.0000 mg | ORAL_TABLET | Freq: Every day | ORAL | 3 refills | Status: DC

## 2017-02-06 MED ORDER — CARVEDILOL 25 MG PO TABS: 25.0000 mg | ORAL_TABLET | Freq: Two times a day (BID) | ORAL | 3 refills | Status: DC

## 2017-02-06 NOTE — Progress Notes (Signed)
Cardiology Office Note  Date:  02/06/2017   ID:  Russell Joy., DOB 1951/12/11, MRN [id number]  PCP:  Fidel Levy, MD   Chief Complaint  Patient presents with  . other     Over due 3 month f/u. Last seen 01/2016. Pt states he is doing well. Reviewed meds with pt verbally.    HPI:  Mr. Difranco is a very pleasant 66 year old gentleman with a history of diabetes, hypertension, patient of Dr. Juanetta Gosling, who initially presented for abnormal EKG. he had a colonoscopy. During the procedure, noted to have abnormal telemetry. EKG was performed that showed anterolateral wall abnormality. He was referred back to Dr. Juanetta Gosling. EKG with Dr. Juanetta Gosling showed T-wave abnormality in V5 through V6, leads 1 and aVL He had a echocardiogram that showed depressed ejection fraction 25-30%. In 2014  pharmacologic Myoview suggested mid to distal anterior wall perfusion defect concerning for ischemia. He was started on beta blocker and ACE inhibitor He underwent cardiac catheterization given the perfusion defect and severely depressed ejection fraction.   this showed no significant CAD, ejection fraction had improved up to 40%. 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 In last visit amlodipine was held and he was  started on chlorthalidone.  He continues to take carvedilol, lisinopril.  Previously reported symptoms of erectile dysfunction. Previously started on Cialis   PMH:   has a past medical history of Diabetes mellitus without complication (HCC); Hypertension; Impotence of organic origin; Pain in joint, shoulder region; Personal history of colonic polyps; Type II or unspecified type diabetes mellitus without mention of complication, not stated as uncontrolled; Unspecified essential hypertension; and Unspecified vitamin D deficiency.  PSH:    Past Surgical History:  Procedure Laterality Date  . CARDIAC CATHETERIZATION  11/17/2013  . COLONOSCOPY  2009    Current Outpatient Prescriptions  Medication Sig Dispense Refill  . Acetaminophen (TYLENOL PO) Take by mouth as needed.    Marland Kitchen amLODipine (NORVASC) 10 MG tablet Take 1 tablet (10 mg total) by mouth daily. 90 tablet 3  . carvedilol (COREG) 25 MG tablet Take 1 tablet (25 mg total) by mouth 2 (two) times daily. 180 tablet 3  . Cholecalciferol (VITAMIN D3) 2000 units TABS Take by mouth.    Marland Kitchen lisinopril (PRINIVIL,ZESTRIL) 40 MG tablet Take 1 tablet (40 mg total) by mouth daily. 90 tablet 3  . metFORMIN (GLUCOPHAGE-XR) 500 MG 24 hr tablet Take 1 tablet (500 mg total) by mouth 2 (two) times daily. 60 tablet 12  . VIAGRA 100 MG tablet Take 100 mg by mouth See admin instructions.  5  . sildenafil (REVATIO) 20 MG tablet Take 1 tablet (20 mg total) by mouth 3 (three) times daily as needed. 90 tablet 3   No current facility-administered medications for this visit.      Allergies:   Patient has no allergy  information on record.   Social History:  The patient  reports that he has never smoked. He has never used smokeless tobacco. He reports that he drinks alcohol. He reports that he does not use drugs.   Family History:   family history includes Colon cancer in his father; Diabetes in his mother; Hypertension in his mother.    Review of  Systems: Review of Systems  Constitutional: Negative.   Respiratory: Negative.   Cardiovascular: Negative.   Gastrointestinal: Negative.   Musculoskeletal: Negative.   Neurological: Negative.   Psychiatric/Behavioral: Negative.   All other systems reviewed and are negative.    PHYSICAL EXAM: VS:  BP 132/82 (BP Location: Left Arm, Patient Position: Sitting, Cuff Size: Normal)   Pulse 67   Ht 5\' 8"  (1.727 m)   Wt 159 lb 8 oz (72.3 kg)   BMI 24.25 kg/m  , BMI Body mass index is 24.25 kg/m. GEN: Well nourished, well developed, in no acute distress  HEENT: normal  Neck: no JVD, carotid bruits, or masses Cardiac: RRR; no murmurs, rubs, or gallops,no edema  Respiratory:  clear to auscultation bilaterally, normal work of breathing GI: soft, nontender, nondistended, + BS MS: no deformity or atrophy  Skin: warm and dry, no rash Neuro:  Strength and sensation are intact Psych: euthymic mood, full affect    Recent Labs: 02/15/2016: ALT 12; Potassium 4.3; TSH 1.19    Lipid Panel No results found for: CHOL, HDL, LDLCALC, TRIG    Wt Readings from Last 3 Encounters:  02/06/17 159 lb 8 oz (72.3 kg)  12/11/16 156 lb (70.8 kg)  07/24/16 157 lb (71.2 kg)       ASSESSMENT AND PLAN:  Essential hypertension - Blood pressure is well controlled on today's visit. No changes made to the medications.  Congestive dilated cardiomyopathy (HCC) - Plan: EKG 12-Lead Euvolemic, weight stable, tolerating his medications He previously declined additional workup or medications as he felt fine No changes made on today's visit  Controlled type 2 diabetes mellitus without complication, unspecified long term insulin use status (HCC) --recommended strict low carbohydrate diet, exercise  Other male erectile dysfunction Refilled his Viagra   Total encounter time more than 15 minutes  Greater than 50% was spent in counseling and coordination of care with the patient   Disposition:   F/U  12  months   Orders Placed This Encounter  Procedures  . EKG 12-Lead     Signed, Dossie Arbour, M.D., Ph.D. 02/06/2017  [facility] [place], [place] [phone]

## 2017-02-06 NOTE — Patient Instructions (Addendum)
Medication Instructions:   No medication changes made  Try revatio instead of viagra (you may need to take 3 to 5 pills of the 20 mg pill)  Labwork:  No new labs needed  Testing/Procedures:  No further testing at this time   I recommend watching educational videos on topics of interest to you at:       [email or web address]  Enter code: [email or web address]    Follow-Up: It was a pleasure seeing you in the office today. Please call us if you have new issues that need to be addressed before your next appt.  [phone]  Your physician wants you to follow-up in: 12 months.  You will receive a reminder letter in the mail two months in advance. If you don't receive a letter, please call our office to schedule the follow-up appointment.  If you need a refill on your cardiac medications before your next appointment, please call your pharmacy.

## 2017-04-13 ENCOUNTER — Encounter: Payer: Self-pay | Admitting: Family Medicine

## 2017-04-14 LAB — THYROID PANEL
Free Thyroxine: 2.1 (ref 1.2–4.9)
T3 UPTAKE: 29 (ref 24–39)
TSH: 0.555 (ref 0.450–4.5)
Thyroxine (T4): 7.1 (ref 4.5–12.0)

## 2017-04-14 LAB — LIPID PANEL
CHOL/HDL RATIO: 2.4
CHOLESTEROL, TOTAL: 152
HDL Cholesterol: 64
LDL Cholesterol (Calc): 77
Triglycerides: 56
VLDL CHOLESTEROL CAL: 11

## 2017-04-14 LAB — COMPREHENSIVE METABOLIC PANEL
ALBUMIN: 4.2 (ref 3.6–4.8)
ALK PHOS: 39 U/L
ALT: 16
AST: 12 U/L
BUN / CREAT RATIO: 15
BUN: 12
CALCIUM: 9 mg/dL
Chloride: 104 mmol/L
Creat: 0.81
EGFR (African American): 95
GFR CALC NON AF AMER: 109
GGT: 25
GLOBULIN, TOTAL: 2.1
Glucose: 125 — AB (ref 65–99)
IRON: 69 (ref 38–169)
LDH: 163 (ref 121–224)
POTASSIUM: 3.9 mmol/L
Phosphorus: 3.9
Sodium: 143
Total Protein: 6.3 g/dL (ref 6.0–8.5)
Uric Acid: 4.5 (ref 3.7–8.6)

## 2017-04-14 LAB — CBC WITH DIFFERENTIAL
BASO%: 0 %
Basophils Absolute: 0 /uL
EOS (ABSOLUTE VALUE): 0.1
EOS%: 2 %
GRANULOCYTE PERCENT: 0 % — AB (ref 37–80)
Granulocyte count absolute: 0
HEMATOCRIT: 39 %
Hemoglobin: 12.9 — AB (ref 13–17.7)
LYMPH%: 26 %
Lymphs Abs: 1.5
MCH: 29.8
MCHC: 33.2
MCV: 90
MONO ABS: 1 /uL
Monocytes relative %: 10 % (ref 2–10)
NEUTROS PCT: 62
Neutrophils Absolute: 4 /uL
PLATELETS: 191
RBC: 4.33
RDW: 15
WBC: 5.7

## 2017-04-14 LAB — HEMOGLOBIN A1C: HEMOGLOBIN A1C: 6.6 — AB (ref 4.8–5.6)

## 2017-04-14 LAB — VITAMIN D 25 HYDROXY (VIT D DEFICIENCY, FRACTURES): VIT D 25 HYDROXY: 39.9 (ref 30–100)

## 2017-04-14 LAB — PSA: Prostate Specific Ag, Serum: 4.5 — AB (ref 0–4.0)

## 2017-04-17 ENCOUNTER — Encounter: Payer: Self-pay | Admitting: Family Medicine

## 2017-04-17 ENCOUNTER — Ambulatory Visit (INDEPENDENT_AMBULATORY_CARE_PROVIDER_SITE_OTHER): Payer: BLUE CROSS/BLUE SHIELD | Admitting: Family Medicine

## 2017-04-17 VITALS — BP 132/79 | HR 69 | Temp 98.0°F | Resp 16 | Ht 68.0 in | Wt 157.2 lb

## 2017-04-17 DIAGNOSIS — E119 Type 2 diabetes mellitus without complications: Secondary | ICD-10-CM

## 2017-04-17 DIAGNOSIS — R972 Elevated prostate specific antigen [PSA]: Secondary | ICD-10-CM | POA: Diagnosis not present

## 2017-04-17 DIAGNOSIS — E559 Vitamin D deficiency, unspecified: Secondary | ICD-10-CM

## 2017-04-17 NOTE — Progress Notes (Signed)
Subjective:    Patient ID: Russell [name]., male    DOB: 1951/10/13, 66 y.o.   MRN: [id number]  Russell [name]. is a 66 y.o. male presenting on 04/17/2017 for Hypertension (A1c done at [facility] 6.6 % done on 04/13/17)   HPI   Today has copy of outside [place] results from health screening through insurance company.  CHRONIC DM, Type 2: Reports initial diagnosis Type 2 DM about 2-3 years ago, has been well controlled. CBGs: Doesn't check CBG regularly Meds: Metformin 500mg  XR daily - has been on this for about 2-3 years Reports good compliance. Tolerating well w/o side-effects Currently on ACEi Lifestyle: Diet (tries to follow DM diet) / Exercise (continue walking and using treadmill at work, 1-2x weekly) - Family history of DM with mother Denies hypoglycemia, polyuria, visual changes, numbness or tingling.  Vitamin D deficiency - Reports prior Vitamin D deficiency. Now with reviewed outside Vitamin D result in normal range. Continues to take Vitamin D3 2,000 unit daily for maintenance  Elevated PSA - Prior history of elevated PSA over past few years, reviewed old results with PSA 4.7 (01/2016), then 1 month later 3.8 (02/2016), now recent PSA 4.5 (04/13/17). He has been followed by local Urology Anola Gurney [facility]) within past 1-2 years, has not been back in 1 year. Has had prior DRE exam was not told anything significantly abnormal about prostate. - No known family history of prostate cancer. Never had prostate biopsy before. - Denies any change in urinary symptoms, LUTS (weak stream, starting stopping, frequency, incomplete emptying)   Social History  Substance Use Topics  . Smoking status: Never Smoker  . Smokeless tobacco: Never Used  . Alcohol use Yes     Comment: occassional    Review of Systems Per HPI unless specifically indicated above     Objective:    BP 132/79   Pulse 69   Temp 98 F (36.7 C) (Oral)   Resp 16   Ht 5\' 8"  (1.727 m)   Wt 157 lb 3.2 oz  (71.3 kg)   BMI 23.90 kg/m   Wt Readings from Last 3 Encounters:  04/17/17 157 lb 3.2 oz (71.3 kg)  02/06/17 159 lb 8 oz (72.3 kg)  12/11/16 156 lb (70.8 kg)    Physical Exam  Constitutional: He is oriented to person, place, and time. He appears well-developed and well-nourished. No distress.  Well-appearing, comfortable, cooperative  HENT:  Head: Normocephalic and atraumatic.  Mouth/Throat: Oropharynx is clear and moist.  Eyes: Conjunctivae are normal.  Neck: Normal range of motion. Neck supple. No thyromegaly present.  Cardiovascular: Normal rate, regular rhythm, normal heart sounds and intact distal pulses.   No murmur heard. Pulmonary/Chest: Effort normal and breath sounds normal. No respiratory distress. He has no wheezes. He has no rales.  Genitourinary:  Genitourinary Comments: Deferred rectal DRE exam.  Musculoskeletal: He exhibits no edema.  Lymphadenopathy:    He has no cervical adenopathy.  Neurological: He is alert and oriented to person, place, and time.  Skin: Skin is warm and dry. No rash noted. He is not diaphoretic. No erythema.  Psychiatric: He has a normal mood and affect. His behavior is normal.  Nursing note and vitals reviewed.    Diabetic Foot Exam - Simple   Simple Foot Form Diabetic Foot exam was performed with the following findings:  Yes 04/17/2017  4:30 PM  Visual Inspection No deformities, no ulcerations, no other skin breakdown bilaterally:  Yes Sensation Testing Intact to touch  and monofilament testing bilaterally:  Yes Pulse Check Posterior Tibialis and Dorsalis pulse intact bilaterally:  Yes Comments        Assessment & Plan:   Problem List Items Addressed This Visit    Vitamin D deficiency    Improved, now after therapeutic dose, outside lab normal range Vit D Continue Vitamin D3 2,000 unit daily maintenance      Elevated PSA    Was followed by Urology Dr Evelene Croon ([facility]) Concern elevated risk with patient age 66, Africian  American, despite no family history of prostate CA. Prior abnormal readings have been stable over past 2 years now - Last PSA 4.5 (03/2017), prior values 4.7 > 3.8 - Last DRE 1 year ago reported normal - Clinically asymptomatic  Plan: 1. Reviewed prostate cancer screening guidelines and risks including potential prostate biopsy if abnormal PSA. Recommended that patient return to Urology to continue follow-up on this issue likely yearly unless discharged with stable PSA. Notify office if need new referral      Controlled type 2 diabetes mellitus without complication (HCC) - Primary    Well controlled stable DM, A1c 6.6 outside lab, unchanged essentially in 2 years No known complication - prior mildly elevated Cr has normalized on outside lab reading  Plan: 1. Continue current med - Metformin XR 500mg  daily 2. Encourage continue to improve DM diet and regular exercise 3. DM foot exam done today 4. Continue ACEi 5. Briefly discussed importance of ASCVD risk reduction with treatment of statin and/or ASA. Also patient interested in Advanced Lipid Profile with Inflammation markers for better prediction of ASCVD, he will check ins coverage and may request this lab in future before next visit 6. Follow-up 3 months DM A1c          Follow up plan: Return in about 4 months (around 08/17/2017) for Diabetes A1c, Cholesterol.  Saralyn Pilar, DO [facility] 04/18/2017, 6:40 AM

## 2017-04-17 NOTE — Patient Instructions (Signed)
Thank you for coming in to clinic today.  1.  A1c 6.6, stable, keep up the good work with diabetes, continue walking and using treadmill at work  ------------------------------------------- Please consider adding Hepatitis C Screening test with next annual physical blood work through [employer]  2. Recommend Shingles Vaccine "Zostavax" - please call and ask your insurance company on cost and coverage for this. - If you would like it let us know and we can order it  - Check with insurance on Lipoprotein testing  3. Elevated PSA, probably in normal range since you had similar readings back in 2017 - However, recommend follow-up with Urology Dr Anola Gurney at [facility], call to schedule apt, you should not need new referral since it is for same problem Elevated PSA, if you need one you may let us know  Please schedule a follow-up appointment with Dr. Althea Charon in 3-4 months for Diabetes A1c, Cholesterol  If you have any other questions or concerns, please feel free to call the clinic or send a message through MyChart. You may also schedule an earlier appointment if necessary.  Saralyn Pilar, DO [facility], [place]

## 2017-04-18 ENCOUNTER — Encounter: Payer: Self-pay | Admitting: Family Medicine

## 2017-04-18 NOTE — Assessment & Plan Note (Signed)
Was followed by Urology Dr Evelene Croon ([facility]) Concern elevated risk with patient age 66, Africian American, despite no family history of prostate CA. Prior abnormal readings have been stable over past 2 years now - Last PSA 4.5 (03/2017), prior values 4.7 > 3.8 - Last DRE 1 year ago reported normal - Clinically asymptomatic  Plan: 1. Reviewed prostate cancer screening guidelines and risks including potential prostate biopsy if abnormal PSA. Recommended that patient return to Urology to continue follow-up on this issue likely yearly unless discharged with stable PSA. Notify office if need new referral

## 2017-04-18 NOTE — Assessment & Plan Note (Signed)
Well controlled stable DM, A1c 6.6 outside lab, unchanged essentially in 2 years No known complication - prior mildly elevated Cr has normalized on outside lab reading  Plan: 1. Continue current med - Metformin XR 500mg  daily 2. Encourage continue to improve DM diet and regular exercise 3. DM foot exam done today 4. Continue ACEi 5. Briefly discussed importance of ASCVD risk reduction with treatment of statin and/or ASA. Also patient interested in Advanced Lipid Profile with Inflammation markers for better prediction of ASCVD, he will check ins coverage and may request this lab in future before next visit 6. Follow-up 3 months DM A1c

## 2017-04-18 NOTE — Assessment & Plan Note (Signed)
Improved, now after therapeutic dose, outside lab normal range Vit D Continue Vitamin D3 2,000 unit daily maintenance

## 2017-05-27 ENCOUNTER — Other Ambulatory Visit: Payer: Self-pay | Admitting: Cardiovascular Disease

## 2017-05-27 DIAGNOSIS — I10 Essential (primary) hypertension: Secondary | ICD-10-CM

## 2017-07-25 ENCOUNTER — Other Ambulatory Visit: Payer: Self-pay | Admitting: Cardiovascular Disease

## 2017-07-25 DIAGNOSIS — I10 Essential (primary) hypertension: Secondary | ICD-10-CM

## 2017-08-21 ENCOUNTER — Other Ambulatory Visit: Payer: Self-pay | Admitting: Cardiovascular Disease

## 2017-08-21 DIAGNOSIS — I10 Essential (primary) hypertension: Secondary | ICD-10-CM

## 2017-08-22 ENCOUNTER — Encounter: Payer: Self-pay | Admitting: Family Medicine

## 2017-08-22 ENCOUNTER — Ambulatory Visit (INDEPENDENT_AMBULATORY_CARE_PROVIDER_SITE_OTHER): Payer: BLUE CROSS/BLUE SHIELD | Admitting: Family Medicine

## 2017-08-22 ENCOUNTER — Other Ambulatory Visit: Payer: Self-pay | Admitting: Cardiovascular Disease

## 2017-08-22 VITALS — BP 125/68 | HR 66 | Temp 98.5°F | Resp 16 | Ht 68.0 in | Wt 154.0 lb

## 2017-08-22 DIAGNOSIS — N529 Male erectile dysfunction, unspecified: Secondary | ICD-10-CM

## 2017-08-22 DIAGNOSIS — I420 Dilated cardiomyopathy: Secondary | ICD-10-CM

## 2017-08-22 DIAGNOSIS — I7090 Unspecified atherosclerosis: Secondary | ICD-10-CM

## 2017-08-22 DIAGNOSIS — E119 Type 2 diabetes mellitus without complications: Secondary | ICD-10-CM

## 2017-08-22 DIAGNOSIS — I10 Essential (primary) hypertension: Secondary | ICD-10-CM

## 2017-08-22 DIAGNOSIS — I2510 Atherosclerotic heart disease of native coronary artery without angina pectoris: Secondary | ICD-10-CM | POA: Insufficient documentation

## 2017-08-22 LAB — POCT GLYCOSYLATED HEMOGLOBIN (HGB A1C): Hemoglobin A1C: 6.6 — AB (ref ?–5.7)

## 2017-08-22 MED ORDER — ASPIRIN EC 81 MG PO TBEC: 81.0000 mg | DELAYED_RELEASE_TABLET | Freq: Every day | ORAL | Status: DC

## 2017-08-22 MED ORDER — METFORMIN HCL ER 500 MG PO TB24: 500.0000 mg | ORAL_TABLET | Freq: Every day | ORAL | 11 refills | Status: DC

## 2017-08-22 MED ORDER — VIAGRA 100 MG PO TABS: 100.0000 mg | ORAL_TABLET | ORAL | 6 refills | Status: DC

## 2017-08-22 NOTE — Assessment & Plan Note (Signed)
Controlled cholesterol on lifestyle. History of dilated cardiomyopathy and inc risk ASCVD Last lipid panel 03/2017 through [facility] Calculated ASCVD 10 yr risk score 21.5% - also diabetic  Plan: 1. Discussionon ASCVD risk reduction strategies - recommended starting ASA + Statin, reviewed benefits outweigh potential risks and side effects - he declines Statin now, would like to read more about it 2. Start EC ASA 81mg  for primary ASCVD risk reduction 3. Encourage improved lifestyle - low carb/cholesterol, reduce portion size, continue improving regular exercise 4. Follow-up 3 months further discussion - also discuss with Cardiology

## 2017-08-22 NOTE — Patient Instructions (Addendum)
Thank you for coming to the clinic today.  1. A1c 6.6, with controlled Diabetes, good job - CHECK metformin medicine at home - see if it says "XR" or extended release - this is a 24 hour pill, I have printed new rx for same medicine Metformin XR 500mg , take ONE pill only in morning with food, - STOP taking 2nd pill in evening.  2. Start enteric coated baby Aspirin 81mg  daily - this is to reduce risk of heart attack and stroke in future  You have elevated cardiovascular risk score. You would also benefit from starting Cholesterol Medication such as Statin, (Rosuvastatin, Crestor, Simvastatin, Atorvastatin) - if interested we can start one of these medications in future.  Ask Dr Mariah Milling about this option in future.  Please schedule a Follow-up Appointment to: Return in about 3 months (around 11/22/2017) for DM A1c, HLD/ASCVD.  If you have any other questions or concerns, please feel free to call the clinic or send a message through MyChart. You may also schedule an earlier appointment if necessary.  Additionally, you may be receiving a survey about your experience at our clinic within a few days to 1 week by e-mail or mail. We value your feedback.  Russell Pilar, DO [facility], [place]

## 2017-08-22 NOTE — Assessment & Plan Note (Signed)
Well controlled stable DM, A1c 6.6 (stable for 2 years) No known complication  Plan: 1. REDUCE dose Metformin - was on XR 500mg  BID - incorrect dosing, new rx for Metformin XR 500mg  DAILY now, printed and he will fill when ready 2. Encourage improved lifestyle - low carb, low sugar diet, reduce portion size, continue improving regular exercise 3. Check CBG, bring log to next visit for review 4. New start ASA 81mg  daily 5. Continue ACEi 6. Discussion on starting statin - patient declined 7. Advised to schedule DM ophtho exam, send record 8. Follow-up 3 months DM A1c since made change, then can consider q 6 mo A1c - will be due for Prevnar13 at age 15 (declined current PNA vax)

## 2017-08-22 NOTE — Assessment & Plan Note (Signed)
Clinically stable, euvolemic, without recent concern Followed by Cardiology Dr Mariah Milling Has inc risk ASCVD, discussed primary prevention, see A&P

## 2017-08-22 NOTE — Progress Notes (Signed)
Subjective:    Patient ID: Russell [name]., male    DOB: May 16, 1951, 66 y.o.   MRN: [id number]  Russell [name]. is a 66 y.o. male presenting on 08/22/2017 for Diabetes (highest 140 and lowest 130)   HPI   CHRONIC DM, Type 2: No new concerns. He thinks DM is well controlled. Feels good today. CBGs: Doesn't check CBG regularly - has a few rare readings, high 140, low >100 Meds: Metformin 500mg  XR TWICE daily (incorrect dosing) Reports good compliance. Tolerating well w/o side-effects Currently on ACEi Lifestyle: - Diet (tries to follow DM diet, recent changes inc salad intake, some grilled chicken) - Exercise (No change since last visit, still working on walking and using treadmill at work, 1-3x weekly) Denies hypoglycemia, polyuria, visual changes, numbness or tingling.  ASCVD Cardiovascular Risk - Followed by Dr Mariah Milling for HTN and H/o Dilated Cardiomyopathy CHF - Previously reports prior cholesterol lab results have been normal, and he has never been on Statin or cholesterol medication in past - He does not take Aspirin - Denies chest pain, dyspnea, exertional symptoms  ED Requesting refill on Viagra. He was prescribed Sildenafil in past by Cardiology, stated this was not as effective. Also he is followed by [facility] Dr Evelene Croon, but has not returned recently. He was prescribed Viagra in past with better results. Asking for refill today, states it was covered by insurance.  Health Maintenance: - Due for Flu Shot in 2018, will get through job, notify us when receives - Due for routine Hep C screening, will get with labs in 2019 - Due for Pneumonia vaccine, does not believe he has had before, declines now and opts to start with Prevnar-13 at age 66 next year   Social History  Substance Use Topics  . Smoking status: Never Smoker  . Smokeless tobacco: Never Used  . Alcohol use Yes     Comment: occassional    Review of Systems Per HPI unless specifically indicated above    Objective:    BP 125/68   Pulse 66   Temp 98.5 F (36.9 C) (Oral)   Resp 16   Ht 5\' 8"  (1.727 m)   Wt 154 lb (69.9 kg)   BMI 23.42 kg/m   Wt Readings from Last 3 Encounters:  08/22/17 154 lb (69.9 kg)  04/17/17 157 lb 3.2 oz (71.3 kg)  02/06/17 159 lb 8 oz (72.3 kg)    Physical Exam  Constitutional: He is oriented to person, place, and time. He appears well-developed and well-nourished. No distress.  Well-appearing, comfortable, cooperative  HENT:  Head: Normocephalic and atraumatic.  Mouth/Throat: Oropharynx is clear and moist.  Eyes: Conjunctivae are normal. Right eye exhibits no discharge. Left eye exhibits no discharge.  Neck: Normal range of motion. Neck supple. No thyromegaly present.  No carotid bruits  Cardiovascular: Normal rate, regular rhythm, normal heart sounds and intact distal pulses.   No murmur heard. Pulmonary/Chest: Effort normal and breath sounds normal. No respiratory distress. He has no wheezes. He has no rales.  Musculoskeletal: Normal range of motion. He exhibits no edema.  Has compression stockings  Lymphadenopathy:    He has no cervical adenopathy.  Neurological: He is alert and oriented to person, place, and time.  Skin: Skin is warm and dry. No rash noted. He is not diaphoretic. No erythema.  Psychiatric: He has a normal mood and affect. His behavior is normal.  Well groomed, good eye contact, normal speech and thoughts  Nursing note and vitals  reviewed.    Recent Labs  12/11/16 1624 04/13/17 0920 08/22/17 1601  HGBA1C 6.7% 6.6* 6.6*    Results for orders placed or performed in visit on 08/22/17  POCT HgB A1C  Result Value Ref Range   Hemoglobin A1C 6.6 (A) 5.7      Assessment & Plan:   Problem List Items Addressed This Visit    Erectile dysfunction    Chronic ED, likely related to ASCVD and DM HTN Prior failed Sildenafil Refilled Viagra 100mg  daily PRN use, he may follow-up with Urology in future if need      Relevant  Medications   VIAGRA 100 MG tablet   Controlled type 2 diabetes mellitus without complication (HCC) - Primary    Well controlled stable DM, A1c 6.6 (stable for 2 years) No known complication  Plan: 1. REDUCE dose Metformin - was on XR 500mg  BID - incorrect dosing, new rx for Metformin XR 500mg  DAILY now, printed and he will fill when ready 2. Encourage improved lifestyle - low carb, low sugar diet, reduce portion size, continue improving regular exercise 3. Check CBG, bring log to next visit for review 4. New start ASA 81mg  daily 5. Continue ACEi 6. Discussion on starting statin - patient declined 7. Advised to schedule DM ophtho exam, send record 8. Follow-up 3 months DM A1c since made change, then can consider q 6 mo A1c - will be due for Prevnar13 at age 77 (declined current PNA vax)      Relevant Medications   metFORMIN (GLUCOPHAGE-XR) 500 MG 24 hr tablet   aspirin EC 81 MG tablet   Other Relevant Orders   POCT HgB A1C (Completed)   Congestive dilated cardiomyopathy (HCC)    Clinically stable, euvolemic, without recent concern Followed by Cardiology Dr Mariah Milling Has inc risk ASCVD, discussed primary prevention, see A&P      Relevant Medications   VIAGRA 100 MG tablet   aspirin EC 81 MG tablet   Atherosclerotic vascular disease    Controlled cholesterol on lifestyle. History of dilated cardiomyopathy and inc risk ASCVD Last lipid panel 03/2017 through [facility] Calculated ASCVD 10 yr risk score 21.5% - also diabetic  Plan: 1. Discussionon ASCVD risk reduction strategies - recommended starting ASA + Statin, reviewed benefits outweigh potential risks and side effects - he declines Statin now, would like to read more about it 2. Start EC ASA 81mg  for primary ASCVD risk reduction 3. Encourage improved lifestyle - low carb/cholesterol, reduce portion size, continue improving regular exercise 4. Follow-up 3 months further discussion - also discuss with Cardiology      Relevant  Medications   VIAGRA 100 MG tablet   aspirin EC 81 MG tablet      Meds ordered this encounter  Medications  . VIAGRA 100 MG tablet    Sig: Take 1 tablet (100 mg total) by mouth See admin instructions. As needed 30 min prior to sex.    Dispense:  6 tablet    Refill:  6  . metFORMIN (GLUCOPHAGE-XR) 500 MG 24 hr tablet    Sig: Take 1 tablet (500 mg total) by mouth daily with breakfast.    Dispense:  30 tablet    Refill:  11  . aspirin EC 81 MG tablet    Sig: Take 1 tablet (81 mg total) by mouth daily.    Follow up plan: Return in about 3 months (around 11/22/2017) for DM A1c, HLD/ASCVD.  A total of 25 minutes was spent face-to-face with this patient. Greater  than 50% of this time was spent in counseling on ASCVD risk reduction lifestyle and medication options, risks and benefits.  Saralyn Pilar, DO [facility] 08/22/2017, 4:43 PM

## 2017-08-22 NOTE — Assessment & Plan Note (Signed)
Chronic ED, likely related to ASCVD and DM HTN Prior failed Sildenafil Refilled Viagra 100mg  daily PRN use, he may follow-up with Urology in future if need

## 2017-09-22 ENCOUNTER — Other Ambulatory Visit: Payer: Self-pay | Admitting: Cardiovascular Disease

## 2017-09-22 DIAGNOSIS — I10 Essential (primary) hypertension: Secondary | ICD-10-CM

## 2017-11-27 ENCOUNTER — Ambulatory Visit: Payer: BLUE CROSS/BLUE SHIELD | Admitting: Family Medicine

## 2017-12-21 ENCOUNTER — Ambulatory Visit (INDEPENDENT_AMBULATORY_CARE_PROVIDER_SITE_OTHER): Payer: BLUE CROSS/BLUE SHIELD | Admitting: Family Medicine

## 2017-12-21 ENCOUNTER — Encounter: Payer: Self-pay | Admitting: Family Medicine

## 2017-12-21 VITALS — BP 110/70 | HR 60 | Resp 15 | Ht 68.0 in | Wt 154.0 lb

## 2017-12-21 DIAGNOSIS — Z1159 Encounter for screening for other viral diseases: Secondary | ICD-10-CM | POA: Diagnosis not present

## 2017-12-21 DIAGNOSIS — I10 Essential (primary) hypertension: Secondary | ICD-10-CM

## 2017-12-21 DIAGNOSIS — I7090 Unspecified atherosclerosis: Secondary | ICD-10-CM | POA: Diagnosis not present

## 2017-12-21 DIAGNOSIS — E119 Type 2 diabetes mellitus without complications: Secondary | ICD-10-CM | POA: Diagnosis not present

## 2017-12-21 LAB — POCT GLYCOSYLATED HEMOGLOBIN (HGB A1C): Hemoglobin A1C: 6.6

## 2017-12-21 NOTE — Progress Notes (Signed)
Subjective:    Patient ID: Russell Joyavid Zapata Jr., male    DOB: 12/08/1951, 66 y.o.   MRN: [id number]  Russell JoyDavid Simson Jr. is a 66 y.o. male presenting on 12/21/2017 for Diabetes and Hyperlipidemia   HPI   CHRONIC DM, Type 2: Since last visit he reduced metformin to just once daily, feels like it is working well. CBGs: Doesn't check CBG regularly - has a few rare readings, High < 150, low >100 Meds: Metformin 500mg  XR daily (previously was taking XR BID) Reports good compliance. Tolerating well w/o side-effects Currently on ACEi Lifestyle:  - Diet (tries to follow DM diet, admits to recent holiday sweets) - Exercise (No change since last visit, still working on walking and using treadmill at work, 1-3x weekly) - He states last DM Eye Exam in January 2018 (no reported DM retinopathy) - [facility]  Denies hypoglycemia, polyuria, visual changes, numbness or tingling.  CHRONIC HTN: Reports no new concerns. BP stable outside office Current Meds - Carvedilol 25mg  BID, Amlodipine 10mg  daily, Lisinopril 40mg  daily  Reports good compliance, took meds today. Tolerating well, w/o complaints. Denies CP, dyspnea, HA, edema, dizziness / lightheadedness  ASCVD Cardiovascular Risk Since last visit he has started ASA 81mg  daily, misses occasional dose, tolerating well - Followed by Dr Mariah MillingGollan for HTN and H/o Dilated Cardiomyopathy CHF - Never on Statin - Denies chest pain, dyspnea, exertional symptoms  Health Maintenance: UTD Flu Vaccine - Due for routine Hep C screening, - lab ordered for [facility] will get soon or may get in April 2019 - Due Prevnar-13 at age 66  Depression screen PHQ 2/9 08/22/2017 02/17/2016 11/12/2015  Decreased Interest 0 0 0  Down, Depressed, Hopeless 0 0 0  PHQ - 2 Score 0 0 0    Social History   Tobacco Use  . Smoking status: Never Smoker  . Smokeless tobacco: Never Used  Substance Use Topics  . Alcohol use: Yes    Comment: occassional  . Drug use: No     Review of Systems Per HPI unless specifically indicated above     Objective:    BP 110/70 (BP Location: Right Arm, Patient Position: Sitting, Cuff Size: Normal)   Pulse 60   Resp 15   Ht 5\' 8"  (1.727 m)   Wt 154 lb (69.9 kg)   SpO2 99%   BMI 23.42 kg/m   Wt Readings from Last 3 Encounters:  12/21/17 154 lb (69.9 kg)  08/22/17 154 lb (69.9 kg)  04/17/17 157 lb 3.2 oz (71.3 kg)    Physical Exam  Constitutional: He is oriented to person, place, and time. He appears well-developed and well-nourished. No distress.  Well-appearing, comfortable, cooperative  HENT:  Head: Normocephalic and atraumatic.  Mouth/Throat: Oropharynx is clear and moist.  Eyes: Conjunctivae are normal. Right eye exhibits no discharge. Left eye exhibits no discharge.  Neck: Normal range of motion. Neck supple. No thyromegaly present.  Cardiovascular: Normal rate, regular rhythm, normal heart sounds and intact distal pulses.  No murmur heard. Pulmonary/Chest: Effort normal and breath sounds normal. No respiratory distress. He has no wheezes. He has no rales.  Musculoskeletal: Normal range of motion. He exhibits no edema.  Lymphadenopathy:    He has no cervical adenopathy.  Neurological: He is alert and oriented to person, place, and time.  Skin: Skin is warm and dry. No rash noted. He is not diaphoretic. No erythema.  Psychiatric: He has a normal mood and affect. His behavior is normal.  Well groomed, good  eye contact, normal speech and thoughts  Nursing note and vitals reviewed.    Recent Labs    04/13/17 0920 08/22/17 1601 12/21/17 1604  HGBA1C 6.6* 6.6* 6.6    Results for orders placed or performed in visit on 12/21/17  POCT HgB A1C  Result Value Ref Range   Hemoglobin A1C 6.6       Assessment & Plan:   Problem List Items Addressed This Visit    Atherosclerotic vascular disease    Controlled cholesterol on lifestyle. History of dilated cardiomyopathy and inc risk ASCVD Last lipid  panel 03/2017 through [facility] Calculated ASCVD 10 yr risk score 21.5% - also diabetic  Plan: 1. Continue EC ASA 81mg  for primary ASCVD risk reduction - defer further discussion again on Statin until repeat lipids and consider adv lipid profile as recommended 3. Encourage improved lifestyle - low carb/cholesterol, reduce portion size, continue improving regular exercise 4. Follow-up 6 months annual phys + lab review, asked to follow-up discuss with cardiology      Controlled type 2 diabetes mellitus without complication (HCC) - Primary    Well controlled stable DM, A1c 6.6 (stable for >2 years), now on lower Metformin dose No known complication  Plan: 1. CONTINUE lower dose Metformin XR 500mg  daily 2. Encourage improved lifestyle - low carb, low sugar diet, reduce portion size, continue improving regular exercise 3. Check CBG, bring log to next visit for review 4. Continue ACEi, ASA - future reconsider starting statin, check lipids in future / consider adv lipid profile 5. Advised to schedule DM ophtho exam, send record - [facility]) - he stated had done 12/2016, we called today and confirm his last exam was in 2017, he will schedule for 12/2017 6. Follow-up 6 months Annual Phys + labs A1c review, now q 6 mo visits for A1c instead of q 3 mo, next visit due [id number]      Relevant Orders   POCT HgB A1C (Completed)   Essential hypertension    Well-controlled HTN - Home BP readings controlled  No known complications    Plan:  1. Continue current BP regimen Carvedilol 25mg  BID, Amlodipine 10mg  daily, Lisinopril 40mg  daily 2. Encourage improved lifestyle - low sodium diet, regular exercise 3. Continue monitor BP outside office, bring readings to next visit, if persistently >140/90 or new symptoms notify office sooner 4. Follow-up q 6 mo - annual phys and lab review       Other Visit Diagnoses    Need for hepatitis C screening test       Relevant Orders   Hepatitis C  antibody      No orders of the defined types were placed in this encounter.   Follow up plan: Return in about 6 months (around 06/21/2018) for Annual Physical.  Future order given for Hep C routine screen to go to [facility]. He will get rest of his future labs through [facility] in Spring 2019.  Saralyn Pilar, DO [facility] 12/21/2017, 6:13 PM

## 2017-12-21 NOTE — Assessment & Plan Note (Signed)
Well-controlled HTN - Home BP readings controlled  No known complications    Plan:  1. Continue current BP regimen Carvedilol 25mg  BID, Amlodipine 10mg  daily, Lisinopril 40mg  daily 2. Encourage improved lifestyle - low sodium diet, regular exercise 3. Continue monitor BP outside office, bring readings to next visit, if persistently >140/90 or new symptoms notify office sooner 4. Follow-up q 6 mo - annual phys and lab review

## 2017-12-21 NOTE — Assessment & Plan Note (Signed)
Well controlled stable DM, A1c 6.6 (stable for >2 years), now on lower Metformin dose No known complication  Plan: 1. CONTINUE lower dose Metformin XR 500mg  daily 2. Encourage improved lifestyle - low carb, low sugar diet, reduce portion size, continue improving regular exercise 3. Check CBG, bring log to next visit for review 4. Continue ACEi, ASA - future reconsider starting statin, check lipids in future / consider adv lipid profile 5. Advised to schedule DM ophtho exam, send record - [facility]) - he stated had done 12/2016, we called today and confirm his last exam was in 2017, he will schedule for 12/2017 6. Follow-up 6 months Annual Phys + labs A1c review, now q 6 mo visits for A1c instead of q 3 mo, next visit due [id number]

## 2017-12-21 NOTE — Patient Instructions (Addendum)
Thank you for coming to the office today.  1. A1c 6.6 is stable, unchanged in past 1 year - keep up the great work, continue Metformin XR 500mg  once daily  Continue to improve exercise as discussed  Remember to get Diabetic Eye Exam at [facility] in January 2019 (have them fax [place] copy)  2.  Printed Hepatitis C screening test for [facility] get done whenever, soon or wait until April 2019  Get labs done through [facility] at work - Fasting in APril 2019 as you did before, bring copy to your next  Visit or send a copy in advance  Please schedule a Follow-up Appointment to: Return in about 6 months (around 06/21/2018) for Annual Physical.  If you have any other questions or concerns, please feel free to call the office or send a message through MyChart. You may also schedule an earlier appointment if necessary.  Additionally, you may be receiving a survey about your experience at our office within a few days to 1 week by e-mail or mail. We value your feedback.  Saralyn Pilar, DO [facility], [place]

## 2017-12-21 NOTE — Assessment & Plan Note (Signed)
Controlled cholesterol on lifestyle. History of dilated cardiomyopathy and inc risk ASCVD Last lipid panel 03/2017 through [facility] Calculated ASCVD 10 yr risk score 21.5% - also diabetic  Plan: 1. Continue EC ASA 81mg  for primary ASCVD risk reduction - defer further discussion again on Statin until repeat lipids and consider adv lipid profile as recommended 3. Encourage improved lifestyle - low carb/cholesterol, reduce portion size, continue improving regular exercise 4. Follow-up 6 months annual phys + lab review, asked to follow-up discuss with cardiology

## 2018-01-22 ENCOUNTER — Other Ambulatory Visit: Payer: Self-pay | Admitting: Cardiovascular Disease

## 2018-01-22 DIAGNOSIS — I10 Essential (primary) hypertension: Secondary | ICD-10-CM

## 2018-02-18 DIAGNOSIS — H401131 Primary open-angle glaucoma, bilateral, mild stage: Secondary | ICD-10-CM | POA: Diagnosis not present

## 2018-02-21 ENCOUNTER — Other Ambulatory Visit: Payer: Self-pay | Admitting: Cardiovascular Disease

## 2018-02-21 DIAGNOSIS — I10 Essential (primary) hypertension: Secondary | ICD-10-CM

## 2018-03-21 ENCOUNTER — Other Ambulatory Visit: Payer: Self-pay | Admitting: Cardiovascular Disease

## 2018-03-21 DIAGNOSIS — I10 Essential (primary) hypertension: Secondary | ICD-10-CM

## 2018-05-16 ENCOUNTER — Other Ambulatory Visit: Payer: Self-pay | Admitting: Cardiovascular Disease

## 2018-05-16 DIAGNOSIS — I10 Essential (primary) hypertension: Secondary | ICD-10-CM

## 2018-05-24 ENCOUNTER — Other Ambulatory Visit: Payer: Self-pay | Admitting: Cardiovascular Disease

## 2018-05-24 DIAGNOSIS — I10 Essential (primary) hypertension: Secondary | ICD-10-CM

## 2018-06-18 DIAGNOSIS — H25019 Cortical age-related cataract, unspecified eye: Secondary | ICD-10-CM | POA: Diagnosis not present

## 2018-06-18 DIAGNOSIS — H401131 Primary open-angle glaucoma, bilateral, mild stage: Secondary | ICD-10-CM | POA: Diagnosis not present

## 2018-06-18 LAB — COMPREHENSIVE METABOLIC PANEL
A/G RATIO: 1.8
ALBUMIN: 4.2
ALT: 13
AST: 15
Alkaline Phosphatase: 44
BILIRUBIN TOTAL: 0.2
BUN / CREAT RATIO: 15
BUN: 14 (ref 4–21)
CHLORIDE: 106
Calcium: 9.2
Creat: 0.94
Free Thyroxine Index: 2.1
GFR CALC AF AMER: 99
GGT: 25
Globulin, Total: 2.3
Glucose: 128 — AB (ref 65–99)
IRON: 60
LDH: 158
PHOSPHORUS: 4.9 — AB (ref 2.5–4.5)
POTASSIUM: 3.8
PROTEIN: 6.5
Prostate Specific Ag, Serum: 3.8
SODIUM: 146 — AB (ref 134–144)
T3 Uptake: 30
TSH: 1.26
Thyroxine (T4): 6.9
URIC ACID: 5.5
Vit D, 25-Hydroxy: 39.4

## 2018-06-18 LAB — LIPID PANEL
Cholesterol: 156 (ref 0–200)
HDL: 70 (ref 35–70)
LDL Cholesterol: 68
Triglycerides: 89 (ref 40–160)

## 2018-06-18 LAB — HEMOGLOBIN A1C: Hemoglobin A1C: 7

## 2018-06-18 LAB — CBC AND DIFFERENTIAL
HEMATOCRIT: 40 — AB (ref 41–53)
HEMOGLOBIN: 13.1 — AB (ref 13.5–17.5)
NEUTROS ABS: 4
Platelets: 177 (ref 150–399)
WBC: 6.2

## 2018-06-18 LAB — HM HEPATITIS C SCREENING LAB: HM HEPATITIS C SCREENING: NEGATIVE

## 2018-06-18 LAB — MEASLES/MUMPS/RUBELLA IMMUNITY
MUMPS ABS, IGG: 125
RUBELLA: 10.9

## 2018-06-23 ENCOUNTER — Encounter: Payer: Self-pay | Admitting: Family Medicine

## 2018-06-24 ENCOUNTER — Ambulatory Visit (INDEPENDENT_AMBULATORY_CARE_PROVIDER_SITE_OTHER): Payer: BLUE CROSS/BLUE SHIELD | Admitting: Family Medicine

## 2018-06-24 ENCOUNTER — Encounter: Payer: Self-pay | Admitting: Family Medicine

## 2018-06-24 VITALS — BP 118/72 | HR 65 | Temp 98.8°F | Resp 16 | Ht 68.0 in | Wt 154.6 lb

## 2018-06-24 DIAGNOSIS — R972 Elevated prostate specific antigen [PSA]: Secondary | ICD-10-CM

## 2018-06-24 DIAGNOSIS — Z0000 Encounter for general adult medical examination without abnormal findings: Secondary | ICD-10-CM

## 2018-06-24 DIAGNOSIS — I10 Essential (primary) hypertension: Secondary | ICD-10-CM

## 2018-06-24 DIAGNOSIS — N528 Other male erectile dysfunction: Secondary | ICD-10-CM

## 2018-06-24 DIAGNOSIS — E1136 Type 2 diabetes mellitus with diabetic cataract: Secondary | ICD-10-CM | POA: Diagnosis not present

## 2018-06-24 DIAGNOSIS — E559 Vitamin D deficiency, unspecified: Secondary | ICD-10-CM

## 2018-06-24 DIAGNOSIS — I420 Dilated cardiomyopathy: Secondary | ICD-10-CM | POA: Diagnosis not present

## 2018-06-24 MED ORDER — METFORMIN HCL ER 500 MG PO TB24: 500.0000 mg | ORAL_TABLET | Freq: Every day | ORAL | 3 refills | Status: DC

## 2018-06-24 MED ORDER — AMLODIPINE BESYLATE 10 MG PO TABS: 10.0000 mg | ORAL_TABLET | Freq: Every day | ORAL | 3 refills | Status: DC

## 2018-06-24 MED ORDER — LISINOPRIL 40 MG PO TABS: 40.0000 mg | ORAL_TABLET | Freq: Every day | ORAL | 3 refills | Status: DC

## 2018-06-24 MED ORDER — CARVEDILOL 25 MG PO TABS: 25.0000 mg | ORAL_TABLET | Freq: Two times a day (BID) | ORAL | 3 refills | Status: DC

## 2018-06-24 MED ORDER — SILDENAFIL CITRATE 20 MG PO TABS: ORAL_TABLET | ORAL | 5 refills | Status: DC

## 2018-06-24 NOTE — Patient Instructions (Addendum)
Thank you for coming to the office today.  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  Use RICE therapy: - R - Rest / relative rest with activity modification avoid overuse of joint - I - Ice packs (make sure you use a towel or sock / something to protect skin) - C - Compression with flexible Knee Sleeve / ACE wrap to apply pressure and reduce swelling allowing more support - E - Elevation - if significant swelling, lift leg above heart level (toes above your nose) to help reduce swelling, most helpful at night after day of being on your feet  Recommend to start taking Tylenol Extra Strength 500mg  tabs - take 1 to 2 tabs per dose (max 1000mg ) every 6-8 hours for pain (take regularly, don't skip a dose for next 7 days), max 24 hour daily dose is 6 tablets or 3000mg . In the future you can repeat the same everyday Tylenol course for 1-2 weeks at a time.   Please schedule a Follow-up Appointment to: Return in about 6 months (around 12/25/2018) for DM A1c.  If you have any other questions or concerns, please feel free to call the office or send a message through MyChart. You may also schedule an earlier appointment if necessary.  Additionally, you may be receiving a survey about your experience at our office within a few days to 1 week by e-mail or mail. We value your feedback.  Saralyn Pilar, DO [facility], [place]

## 2018-06-24 NOTE — Progress Notes (Signed)
Subjective:    Patient ID: Russell Love., male    DOB: October 13, 1951, 67 y.o.   MRN: [id number]  Russell [name]. is a 67 y.o. male presenting on 06/24/2018 for Annual Exam   HPI   Here for Annual Physical and Lab Review  CHRONIC DM, Type 2 with cataracts Remains on metformin daily. No new concerns CBGs: Checks CBG every other day. Avg 120-140s, High < 155, Low >100 Meds: Metformin 570m XVictorio Palm(Previously was on BID but reduced about 08/22/17 at that time) Reports good compliance. Tolerating well w/o side-effects Currently on ACEi Lifestyle:  stable, no significant change -Diet (tries to follow DM diet) -Exercise (No change since last visit, still working o[employer]and using treadmill at work, 1-3x weekly) - He states last DM Eye Exam in June 2019 (no reported DM retinopathy) - [facility] Denies hypoglycemia  CHRONIC HTN: Reports no new concerns. BP stable outside office. Due for refills Current Meds - Carvedilol 265mBID, Amlodipine 1093maily, Lisinopril 25m52mily  Reports good compliance, took meds today. Tolerating well, w/o complaints.  History of Dilated Cardiomyopathy / ASCVD Cardiovascular Risk Taking ASA 81mg20mly - Followed by Dr GollaRockey SituHTN and H/o Dilated Cardiomyopathy CHF - Never on Statin. Not interested at this time  Erectile Dysfunction Previously taking Viagra 100mg 52m now request generic due to cost.  Osteoarthritis, Left Knee Reports old prior injury, has some stiffness at times, episodic and some popping in Left knee, no significant pain or swelling. No prior clear diagnosis of arthritis.   Health Maintenance:  Prostate CA Screening: Prior DRE reported normal. Last PSA 3.8 (06/18/18), previous reading 4/5 (03/2017). Currently asymptomatic without BPH LUTS. No known family history of prostate CA.   UTD Hep C screening - negative 06/18/18   Due Prevnar-13 at age 71   D29ression screen PHQ 2/9 06/24/2018 08/22/2017  02/17/2016  Decreased Interest 0 0 0  Down, Depressed, Hopeless 0 0 0  PHQ - 2 Score 0 0 0    Past Medical History:  Diagnosis Date  . Impotence of organic origin   . Pain in joint, shoulder region   . Personal history of colonic polyps    Past Surgical History:  Procedure Laterality Date  . CARDIAC CATHETERIZATION  11/17/2013  . COLONOSCOPY  2009   Social History   Socioeconomic History  . Marital status: Married    Spouse name: Not on file  . Number of children: Not on file  . Years of education: Not on file  . Highest education level: Not on file  Occupational History  . Occupation: [employer]  . Financial resource strain: Not on file  . Food insecurity:    Worry: Not on file    Inability: Not on file  . Transportation needs:    Medical: Not on file    Non-medical: Not on file  Tobacco Use  . Smoking status: Never Smoker  . Smokeless tobacco: Never Used  Substance and Sexual Activity  . Alcohol use: Yes    Comment: occassional  . Drug use: No  . Sexual activity: Not on file  Lifestyle  . Physical activity:    Days per week: Not on file    Minutes per session: Not on file  . Stress: Not on file  Relationships  . Social connections:    Talks on phone: Not on file    Gets together: Not on file    Attends  religious service: Not on file    Active member of club or organization: Not on file    Attends meetings of clubs or organizations: Not on file    Relationship status: Not on file  . Intimate partner violence:    Fear of current or ex partner: Not on file    Emotionally abused: Not on file    Physically abused: Not on file    Forced sexual activity: Not on file  Other Topics Concern  . Not on file  Social History Narrative  . Not on file   Family History  Problem Relation Age of Onset  . Colon cancer Father        1970's  . Hypertension Mother   . Diabetes Mother    Current Outpatient Medications on File Prior to Visit    Medication Sig  . Acetaminophen (TYLENOL PO) Take by mouth as needed.  Marland Kitchen aspirin EC 81 MG tablet Take 1 tablet (81 mg total) by mouth daily.  . cetirizine (ZYRTEC) 10 MG tablet Take 10 mg by mouth daily.  . Cholecalciferol (VITAMIN D3) 2000 units TABS Take by mouth.  . latanoprost (XALATAN) 0.005 % ophthalmic solution   . timolol (TIMOPTIC) 0.5 % ophthalmic solution    No current facility-administered medications on file prior to visit.     Review of Systems  Constitutional: Negative for activity change, appetite change, chills, diaphoresis, fatigue and fever.  HENT: Negative for congestion and hearing loss.   Eyes: Negative for visual disturbance.  Respiratory: Negative for apnea, cough, choking, chest tightness, shortness of breath and wheezing.   Cardiovascular: Negative for chest pain, palpitations and leg swelling.  Gastrointestinal: Negative for abdominal pain, anal bleeding, blood in stool, constipation, diarrhea, nausea and vomiting.  Endocrine: Negative for cold intolerance.  Genitourinary: Negative for decreased urine volume, difficulty urinating, dysuria, frequency, hematuria, testicular pain and urgency.       Erectile dysfunction  Musculoskeletal: Negative for arthralgias, back pain and neck pain.  Skin: Negative for rash.  Allergic/Immunologic: Positive for environmental allergies.  Neurological: Negative for dizziness, weakness, light-headedness, numbness and headaches.  Hematological: Negative for adenopathy.  Psychiatric/Behavioral: Negative for behavioral problems, dysphoric mood and sleep disturbance.   Per HPI unless specifically indicated above     Objective:    BP 118/72   Pulse 65   Temp 98.8 F (37.1 C) (Oral)   Resp 16   Ht 5' 8"  (1.727 m)   Wt 154 lb 9.6 oz (70.1 kg)   BMI 23.51 kg/m   Wt Readings from Last 3 Encounters:  06/24/18 154 lb 9.6 oz (70.1 kg)  12/21/17 154 lb (69.9 kg)  08/22/17 154 lb (69.9 kg)    Physical Exam  Constitutional:  He is oriented to person, place, and time. He appears well-developed and well-nourished. No distress.  Well-appearing, comfortable, cooperative  HENT:  Head: Normocephalic and atraumatic.  Mouth/Throat: Oropharynx is clear and moist.  Frontal / maxillary sinuses non-tender. Nares patent without purulence or edema. Bilateral TMs clear without erythema, effusion or bulging. Oropharynx clear without erythema, exudates, edema or asymmetry.  Eyes: Pupils are equal, round, and reactive to light. Conjunctivae and EOM are normal. Right eye exhibits no discharge. Left eye exhibits no discharge.  Neck: Normal range of motion. Neck supple. No thyromegaly present.  Cardiovascular: Normal rate, regular rhythm, normal heart sounds and intact distal pulses.  No murmur heard. Pulmonary/Chest: Effort normal and breath sounds normal. No respiratory distress. He has no wheezes. He has no rales.  Abdominal: Soft. Bowel sounds are normal. He exhibits no distension and no mass. There is no tenderness.  Musculoskeletal: Normal range of motion. He exhibits no edema or tenderness.  Upper / Lower Extremities: - Normal muscle tone, strength bilateral upper extremities 5/5, lower extremities 5/5  Lymphadenopathy:    He has no cervical adenopathy.  Neurological: He is alert and oriented to person, place, and time.  Distal sensation intact to light touch all extremities  Skin: Skin is warm and dry. No rash noted. He is not diaphoretic. No erythema.  Psychiatric: He has a normal mood and affect. His behavior is normal.  Well groomed, good eye contact, normal speech and thoughts  Nursing note and vitals reviewed.    Diabetic Foot Exam - Simple   Simple Foot Form Diabetic Foot exam was performed with the following findings:  Yes 06/24/2018  2:12 PM  Visual Inspection No deformities, no ulcerations, no other skin breakdown bilaterally:  Yes Sensation Testing Intact to touch and monofilament testing bilaterally:   Yes Pulse Check Posterior Tibialis and Dorsalis pulse intact bilaterally:  Yes Comments    Recent Labs    08/22/17 1601 12/21/17 1604 06/18/18  HGBA1C 6.6* 6.6 7.0    Results for orders placed or performed in visit on 06/23/18  Comprehensive metabolic panel  Result Value Ref Range   Glucose 128 (A) 65 - 99   Uric Acid 5.5    BUN 14 4 - 21   Creat 0.94    EGFR (African American) 99    BUN/Creatinine Ratio 15    Sodium 146 (A) 134 - 144   Potassium 3.8    Chloride 106    Calcium 9.2    Phosphorus 4.9 (A) 2.5 - 4.5   Protein 6.5    Albumin 4.2    Globulin, Total 2.3    Albumin/Globulin Ratio 1.8    Total Bilirubin 0.2    Alkaline Phosphatase 44    LDH 158    AST 15    ALT 13    GGT 25    Iron 60    TSH 1.260    Thyroxine (T4) 6.9    T3 Uptake 30    Free Thyroxine Index 2.1    Prostate Specific Ag, Serum 3.8    Vit D, 25-Hydroxy 39.4   HM HEPATITIS C SCREENING LAB  Result Value Ref Range   HM Hepatitis Screen Negative-Validated   Lipid panel  Result Value Ref Range   Triglycerides 89 40 - 160   Cholesterol 156 0 - 200   HDL 70 35 - 70   LDL Cholesterol 68   Hemoglobin A1c  Result Value Ref Range   Hemoglobin A1C 7.0   Measles/Mumps/Rubella Immunity  Result Value Ref Range   Rubella Antibodies, IGG 10.90    RUBEOLA AB, IGG >300    MUMPS ABS, IGG 125   CBC and differential  Result Value Ref Range   Hemoglobin 13.1 (A) 13.5 - 17.5   HCT 40 (A) 41 - 53   Neutrophils Absolute 4    Platelets 177 150 - 399   WBC 6.2       Assessment & Plan:   Problem List Items Addressed This Visit    Congestive dilated cardiomyopathy (HCC)    Clinically stable, euvolemic, without recent concern Followed by Cardiology Dr Rockey Situ Has inc risk ASCVD, discussed primary prevention - continue ASA 81, declined Statin today, see A&P      Relevant Medications   amLODipine (NORVASC) 10  MG tablet   carvedilol (COREG) 25 MG tablet   lisinopril (PRINIVIL,ZESTRIL) 40 MG tablet    sildenafil (REVATIO) 20 MG tablet   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 CONTINUE lower dose Metformin XR 562m daily - will not resume BID 2. Encourage improved lifestyle - low carb, low sugar diet, reduce portion size, continue improving regular exercise - reviewed diet recommendation 3. Check CBG, bring log to next visit for review 4. Continue ACEi, ASA - future reconsider starting statin - declined today 5. DM Foot exam done today / Will request record from Dr WEllin Mayhew6/2019 DM Eye 6. Follow-up 6 months DM A1c      Relevant Medications   lisinopril (PRINIVIL,ZESTRIL) 40 MG tablet   metFORMIN (GLUCOPHAGE-XR) 500 MG 24 hr tablet   Elevated PSA    Improved PSA now < 4, with 3.8, stable based on PSA trend No longer followed by Uro Dr WYves DillPrior values 4.7 > 3.8 > 4.2 > 3.8 - Last DRE 1 year ago reported normal - Clinically asymptomatic  Plan: 1. Reviewed prostate cancer screening guidelines and risks including potential prostate biopsy if abnormal PSA. Recommended that patient return to Urology to continue follow-up on this issue likely yearly unless discharged with stable PSA. Notify office if need new referral      Erectile dysfunction    Chronic stable ED Secondary to ASCVD, DM HTN Improved on Viagra, stopped due to cost Rx Sildenafil generic      Relevant Medications   sildenafil (REVATIO) 20 MG tablet   Essential hypertension    Well-controlled HTN - Home BP readings controlled  No known complications     Plan:  1. Continue current BP regimen Carvedilol 270mBID, Amlodipine 1084maily, Lisinopril 35m64mily - refilled all 2. Encourage improved lifestyle - low sodium diet, regular exercise 3. Continue monitor BP outside office, bring readings to next visit, if  persistently >140/90 or new symptoms notify office sooner 4. Follow-up q 6 months      Relevant Medications   amLODipine (NORVASC) 10 MG tablet   carvedilol (COREG) 25 MG tablet   lisinopril (PRINIVIL,ZESTRIL) 40 MG tablet   sildenafil (REVATIO) 20 MG tablet   Vitamin D deficiency    Controlled Continue Vitamin D3 2,000 iu daily maintenance       Other Visit Diagnoses    Annual physical exam    -  Primary    Updated Health Maintenance information Reviewed recent lab results with patient Encouraged improvement to lifestyle with diet and exercise    Meds ordered this encounter  Medications  . amLODipine (NORVASC) 10 MG tablet    Sig: Take 1 tablet (10 mg total) by mouth daily.    Dispense:  90 tablet    Refill:  3  . carvedilol (COREG) 25 MG tablet    Sig: Take 1 tablet (25 mg total) by mouth 2 (two) times daily.    Dispense:  180 tablet    Refill:  3  . lisinopril (PRINIVIL,ZESTRIL) 40 MG tablet    Sig: Take 1 tablet (40 mg total) by mouth daily.    Dispense:  90 tablet    Refill:  3  . metFORMIN (GLUCOPHAGE-XR) 500 MG 24 hr tablet    Sig: Take 1 tablet (500 mg total) by mouth daily with  breakfast.    Dispense:  90 tablet    Refill:  3  . sildenafil (REVATIO) 20 MG tablet    Sig: Take 2-5 pills about 30 min prior to sex. Start with 2 and increase as needed.    Dispense:  30 tablet    Refill:  5     Follow up plan: Return in about 6 months (around 12/25/2018) for DM A1c.  Nobie Putnam, [facility] 06/24/2018, 9:09 PM

## 2018-06-25 ENCOUNTER — Encounter: Payer: BLUE CROSS/BLUE SHIELD | Admitting: Family Medicine

## 2018-06-25 NOTE — Assessment & Plan Note (Addendum)
Clinically stable, euvolemic, without recent concern Followed by Cardiology Dr Mariah Milling Has inc risk ASCVD, discussed primary prevention - continue ASA 81, declined Statin today, see A&P

## 2018-06-25 NOTE — Assessment & Plan Note (Signed)
Well-controlled HTN - Home BP readings controlled  No known complications     Plan:  1. Continue current BP regimen Carvedilol 25mg  BID, Amlodipine 10mg  daily, Lisinopril 40mg  daily - refilled all 2. Encourage improved lifestyle - low sodium diet, regular exercise 3. Continue monitor BP outside office, bring readings to next visit, if persistently >140/90 or new symptoms notify office sooner 4. Follow-up q 6 months

## 2018-06-25 NOTE — Assessment & Plan Note (Signed)
Improved PSA now < 4, with 3.8, stable based on PSA trend No longer followed by Uro Dr Evelene Croon Prior values 4.7 > 3.8 > 4.2 > 3.8 - Last DRE 1 year ago reported normal - Clinically asymptomatic  Plan: 1. Reviewed prostate cancer screening guidelines and risks including potential prostate biopsy if abnormal PSA. Recommended that patient return to Urology to continue follow-up on this issue likely yearly unless discharged with stable PSA. Notify office if need new referral

## 2018-06-25 NOTE — Assessment & Plan Note (Signed)
Controlled Continue Vitamin D3 2,000 iu daily maintenance

## 2018-06-25 NOTE — Assessment & Plan Note (Signed)
Chronic stable ED Secondary to ASCVD, DM HTN Improved on Viagra, stopped due to cost Rx Sildenafil generic

## 2018-06-25 NOTE — Assessment & Plan Note (Signed)
Increased A1c up to 7.0, previously, A1c 6.6 (stable for >2 years) Concern with some hyperglycemia with elevated A1c No evidence of hypoglycemia No known complication  Plan: 1. CONTINUE lower dose Metformin XR 500mg  daily - will not resume BID 2. Encourage improved lifestyle - low carb, low sugar diet, reduce portion size, continue improving regular exercise - reviewed diet recommendation 3. Check CBG, bring log to next visit for review 4. Continue ACEi, ASA - future reconsider starting statin - declined today 5. DM Foot exam done today / Will request record from Dr Clydene Pugh 05/2018 DM Eye 6. Follow-up 6 months DM A1c

## 2018-06-28 NOTE — Progress Notes (Signed)
Cardiology Office Note  Date:  07/01/2018   ID:  [name], DOB November 26, 1951, MRN [id number]  PCP:  Smitty Cords, DO   Chief Complaint  Patient presents with  . Other    12 month follow up. Patient denies chest pain and SOB. Meds reviewed verbally with patient.     HPI:  Mr. Mcgrue is a 67 year old gentleman with a history of  diabetes,  Hypertension, who initially presented for abnormal EKG. he had a colonoscopy. During the procedure, noted to have abnormal telemetry. EKG was performed that showed anterolateral wall abnormality.  echocardiogram showed depressed ejection fraction 25-30%.   2014 pharmacologic Myoview suggested mid to distal anterior wall perfusion defect concerning for ischemia. He was started on beta blocker and ACE inhibitor He underwent cardiac catheterization given the perfusion defect and severely depressed ejection fraction.   this showed no significant CAD, ejection fraction had improved up to 40%. He presents today for routine follow-up of his nonischemic cardiomyopathy and hypertension  In follow-up today he reports that he feels well with no complaints Reports blood pressure well controlled at home Sleeping well, able to exert himself at work with norestraints Works in [employer], 8 hours at a time walking on [employer] floor Denies any leg swelling abdominal bloating No regular exercise program No chest pain or shortness of breath on exertion  Lab work reviewed HBA1C 7.0 Total chol 156, LD 68 Nonsmoker  Previously declined echocardiogram  EKG personally reviewed by myself on todays visit Shows normal sinus rhythm rate 76 bpm diffuse T-wave abnormality V3 to V6 inferior leads No significant change from previous EKGs  Other past medical history reviewed previously on isosorbide, this was held and he was started on amlodipine. In last visit amlodipine was held and he was started on chlorthalidone.  He continues to take  carvedilol, lisinopril.  Previously reported symptoms of erectile dysfunction. Previously started on Cialis   PMH:   has a past medical history of Impotence of organic origin, Pain in joint, shoulder region, and Personal history of colonic polyps.  PSH:    Past Surgical History:  Procedure Laterality Date  . CARDIAC CATHETERIZATION  11/17/2013  . COLONOSCOPY  2009    Current Outpatient Medications  Medication Sig Dispense Refill  . Acetaminophen (TYLENOL PO) Take by mouth as needed.    Marland Kitchen amLODipine (NORVASC) 10 MG tablet Take 1 tablet (10 mg total) by mouth daily. 90 tablet 3  . aspirin EC 81 MG tablet Take 1 tablet (81 mg total) by mouth daily.    . carvedilol (COREG) 25 MG tablet Take 1 tablet (25 mg total) by mouth 2 (two) times daily. 180 tablet 3  . cetirizine (ZYRTEC) 10 MG tablet Take 10 mg by mouth daily.  4  . Cholecalciferol (VITAMIN D3) 2000 units TABS Take by mouth.    . latanoprost (XALATAN) 0.005 % ophthalmic solution     . lisinopril (PRINIVIL,ZESTRIL) 40 MG tablet Take 1 tablet (40 mg total) by mouth daily. 90 tablet 3  . metFORMIN (GLUCOPHAGE-XR) 500 MG 24 hr tablet Take 1 tablet (500 mg total) by mouth daily with breakfast. 90 tablet 3  . sildenafil (REVATIO) 20 MG tablet Take 2-5 pills about 30 min prior to sex. Start with 2 and increase as needed. 30 tablet 5  . timolol (TIMOPTIC) 0.5 % ophthalmic solution      No current facility-administered medications for this visit.      Allergies:   Patient has no known  allergies.   Social History:  The patient  reports that he has never smoked. He has never used smokeless tobacco. He reports that he drinks alcohol. He reports that he does not use drugs.   Family History:   family history includes Colon cancer in his father; Diabetes in his mother; Hypertension in his mother.    Review of Systems: Review of Systems  Constitutional: Negative.   Respiratory: Negative.   Cardiovascular: Negative.   Gastrointestinal:  Negative.   Musculoskeletal: Negative.   Neurological: Negative.   Psychiatric/Behavioral: Negative.   All other systems reviewed and are negative.    PHYSICAL EXAM: VS:  BP 130/87 (BP Location: Left Arm, Patient Position: Sitting, Cuff Size: Normal)   Pulse 76   Ht 5\' 8"  (1.727 m)   Wt 155 lb 8 oz (70.5 kg)   BMI 23.64 kg/m  , BMI Body mass index is 23.64 kg/m. Constitutional:  oriented to person, place, and time. No distress.  HENT:  Head: Normocephalic and atraumatic.  Eyes:  no discharge. No scleral icterus.  Neck: Normal range of motion. Neck supple. No JVD present.  Cardiovascular: Normal rate, regular rhythm, normal heart sounds and intact distal pulses. Exam reveals no gallop and no friction rub. No edema No murmur heard. Pulmonary/Chest: Effort normal and breath sounds normal. No stridor. No respiratory distress.  no wheezes.  no rales.  no tenderness.  Abdominal: Soft.  no distension.  no tenderness.  Musculoskeletal: Normal range of motion.  no  tenderness or deformity.  Neurological:  normal muscle tone. Coordination normal. No atrophy Skin: Skin is warm and dry. No rash noted. not diaphoretic.  Psychiatric:  normal mood and affect. behavior is normal. Thought content normal.    Recent Labs: 06/18/2018: ALT 13; BUN 14; Creat 0.94; Hemoglobin 13.1; Platelets 177; Potassium 3.8; Sodium 146; TSH 1.260    Lipid Panel Lab Results  Component Value Date   CHOL 156 06/18/2018   HDL 70 06/18/2018   LDLCALC 68 06/18/2018   TRIG 89 06/18/2018      Wt Readings from Last 3 Encounters:  07/01/18 155 lb 8 oz (70.5 kg)  06/24/18 154 lb 9.6 oz (70.1 kg)  12/21/17 154 lb (69.9 kg)      ASSESSMENT AND PLAN:  Essential hypertension - No changes to his blood pressure medication Medications renewed  Congestive dilated cardiomyopathy (HCC) - Plan: EKG 12-Lead Euvolemic, weight stable, tolerating his medications He is declining repeat echocardiogram to evaluate ejection  fraction  Controlled type 2 diabetes mellitus without complication, unspecified long term insulin use status (HCC) -- Recently well controlled recommended strict low carbohydrate diet, exercise Weight stable  PVC Rare, no sx No further workup at this time   Total encounter time more than 25 minutes  Greater than 50% was spent in counseling and coordination of care with the patient   Disposition:   F/U  12 months   Orders Placed This Encounter  Procedures  . EKG 12-Lead     Signed, Dossie Arbour, M.D., Ph.D. 07/01/2018  [facility] [place], [place] [phone]

## 2018-07-01 ENCOUNTER — Ambulatory Visit (INDEPENDENT_AMBULATORY_CARE_PROVIDER_SITE_OTHER): Payer: BLUE CROSS/BLUE SHIELD | Admitting: Cardiovascular Disease

## 2018-07-01 ENCOUNTER — Encounter: Payer: Self-pay | Admitting: Cardiovascular Disease

## 2018-07-01 VITALS — BP 130/87 | HR 76 | Ht 68.0 in | Wt 155.5 lb

## 2018-07-01 DIAGNOSIS — I10 Essential (primary) hypertension: Secondary | ICD-10-CM

## 2018-07-01 DIAGNOSIS — E1136 Type 2 diabetes mellitus with diabetic cataract: Secondary | ICD-10-CM | POA: Diagnosis not present

## 2018-07-01 DIAGNOSIS — I7090 Unspecified atherosclerosis: Secondary | ICD-10-CM

## 2018-07-01 DIAGNOSIS — I420 Dilated cardiomyopathy: Secondary | ICD-10-CM | POA: Diagnosis not present

## 2018-07-01 NOTE — Patient Instructions (Signed)
Medication Instructions:   No medication changes made  Labwork:  No new labs needed  Testing/Procedures:  If you would like an echocardiogram, (outpt) Call the office    Follow-Up: It was a pleasure seeing you in the office today. Please call us if you have new issues that need to be addressed before your next appt.  [phone]  Your physician wants you to follow-up in: 12 months.  You will receive a reminder letter in the mail two months in advance. If you don't receive a letter, please call our office to schedule the follow-up appointment.  If you need a refill on your cardiac medications before your next appointment, please call your pharmacy.  For educational health videos Log in to : [email or web address] Or : [email or web address], password : [email or web address]

## 2018-08-28 DIAGNOSIS — H401130 Primary open-angle glaucoma, bilateral, stage unspecified: Secondary | ICD-10-CM | POA: Diagnosis not present

## 2018-08-28 LAB — HM DIABETES EYE EXAM

## 2018-09-05 ENCOUNTER — Encounter: Payer: Self-pay | Admitting: Family Medicine

## 2018-09-11 ENCOUNTER — Telehealth: Payer: Self-pay | Admitting: Family Medicine

## 2018-09-11 NOTE — Telephone Encounter (Signed)
Mattie from drug store have requested that you call her  At  [phone]  [address], [place]

## 2018-09-12 ENCOUNTER — Other Ambulatory Visit: Payer: Self-pay | Admitting: Family Medicine

## 2018-09-12 DIAGNOSIS — N528 Other male erectile dysfunction: Secondary | ICD-10-CM

## 2018-09-12 MED ORDER — SILDENAFIL CITRATE 20 MG PO TABS: ORAL_TABLET | ORAL | 5 refills | Status: DC

## 2018-09-12 NOTE — Telephone Encounter (Signed)
Called [facility] insurance generally doesn't cover Sildenafil, he can get cheaper with [facility] so verbal was not given to [facility] and patient advised and Rx send to  [facility].

## 2018-09-17 DIAGNOSIS — H401130 Primary open-angle glaucoma, bilateral, stage unspecified: Secondary | ICD-10-CM | POA: Diagnosis not present

## 2018-10-17 DIAGNOSIS — H402213 Chronic angle-closure glaucoma, right eye, severe stage: Secondary | ICD-10-CM | POA: Diagnosis not present

## 2018-10-31 DIAGNOSIS — H402213 Chronic angle-closure glaucoma, right eye, severe stage: Secondary | ICD-10-CM | POA: Diagnosis not present

## 2018-11-14 DIAGNOSIS — H402213 Chronic angle-closure glaucoma, right eye, severe stage: Secondary | ICD-10-CM | POA: Diagnosis not present

## 2018-12-30 ENCOUNTER — Encounter: Payer: Self-pay | Admitting: Family Medicine

## 2018-12-30 ENCOUNTER — Ambulatory Visit (INDEPENDENT_AMBULATORY_CARE_PROVIDER_SITE_OTHER): Payer: BLUE CROSS/BLUE SHIELD | Admitting: Family Medicine

## 2018-12-30 ENCOUNTER — Other Ambulatory Visit: Payer: Self-pay | Admitting: Family Medicine

## 2018-12-30 VITALS — BP 120/71 | HR 77 | Temp 98.4°F | Resp 16 | Ht 68.0 in | Wt 159.6 lb

## 2018-12-30 DIAGNOSIS — N528 Other male erectile dysfunction: Secondary | ICD-10-CM

## 2018-12-30 DIAGNOSIS — E1169 Type 2 diabetes mellitus with other specified complication: Secondary | ICD-10-CM

## 2018-12-30 LAB — POCT GLYCOSYLATED HEMOGLOBIN (HGB A1C): Hemoglobin A1C: 7.5 % — AB (ref 4.0–5.6)

## 2018-12-30 MED ORDER — SILDENAFIL CITRATE 20 MG PO TABS: ORAL_TABLET | ORAL | 5 refills | Status: DC

## 2018-12-30 NOTE — Assessment & Plan Note (Signed)
Increased A1c up to 7.5 - likely due to some poor dietary adherence Concern with persistent hyperglycemia with elevated A1c, still near goal 7.0 No evidence of hypoglycemia Complications - DM cataracts, glaucoma, other including atherosclerotic vascular disease - increases risk of future cardiovascular complications   Plan: 1. CONTINUE Metformin XR 500mg  daily - discussed that no change for now if he can improve lifestyle, if not then may need to go back to BID dosing 2. Encourage improved lifestyle - low carb, low sugar diet, reduce portion size, continue improving regular exercise - reviewed diet recommendation 3. Check CBG, bring log to next visit for review 4. Continue ACEi, ASA - future reconsider starting statin - declined today 5. Follow-up 6 months annual phys w/ labs - if still elevated A1c will consider adjust metformin or other agent at that time.

## 2018-12-30 NOTE — Progress Notes (Signed)
Subjective:    Patient ID: Russell [name]., male    DOB: [birth year], 68 y.o.   MRN: [id number]  Russell Ishman. is a 68 y.o. 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 No new concerns CBGs: Checks CBG every other day. Avg 120-140s, High < 155, Low >100  Meds: Metformin 500mg  XRdaily - previously reduced from BID Reports good compliance. Tolerating well w/o side-effects Currently on ACEi Lifestyle:  stable, no significant change -Diet (tries to follow DM diet) -Exercise (Walking few times a week up to 2 miles) - He is now followed by [facility] Dr Brooke Dare see below, for glaucoma and cataracts Denies hypoglycemia, polyuria, visual changes, numbness or tingling.  Glaucoma, Right sided / Bilateral Cataracts Followed by [facility] w/ Dr Willey Blade. Currently on eyedrops for glaucoma. Next apt 01/2019.  Erectile Dysfunction Currently controlled on Sildenafil 20mg  - taking 2-3 tablets per dose PRN, previously from [facility] pharmacy locally, now request printed rx to go elsewhere.  Health Maintenance: UTD Flu vaccine, 08/2018 received at work Due for initial pneumonia vaccine at age 20 - Prevnar-13 - he declines today will get at next annual physical   Depression screen [facility] 2/9 12/30/2018 06/24/2018 08/22/2017  Decreased Interest 0 0 0  Down, Depressed, Hopeless 0 0 0  PHQ - 2 Score 0 0 0    Social History   Tobacco Use  . Smoking status: Never Smoker  . Smokeless tobacco: Never Used  Substance Use Topics  . Alcohol use: Yes    Comment: occassional  . Drug use: No    Review of Systems Per HPI unless specifically indicated above     Objective:    BP 120/71   Pulse 77   Temp 98.4 F (36.9 C) (Oral)   Resp 16   Ht 5\' 8"  (1.727 m)   Wt 159 lb 9.6 oz (72.4 kg)   BMI 24.27 kg/m   Wt Readings from Last 3 Encounters:  12/30/18 159 lb 9.6 oz (72.4 kg)    07/01/18 155 lb 8 oz (70.5 kg)  06/24/18 154 lb 9.6 oz (70.1 kg)    Physical Exam Vitals signs and nursing note reviewed.  Constitutional:      General: He is not in acute distress.    Appearance: He is well-developed. He is not diaphoretic.     Comments: Well-appearing, comfortable, cooperative  HENT:     Head: Normocephalic and atraumatic.  Eyes:     General:        Right eye: No discharge.        Left eye: No discharge.     Conjunctiva/sclera: Conjunctivae normal.  Neck:     Musculoskeletal: Normal range of motion and neck supple.     Thyroid: No thyromegaly.  Cardiovascular:     Rate and Rhythm: Normal rate and regular rhythm.     Heart sounds: Normal heart sounds. No murmur.  Pulmonary:     Effort: Pulmonary effort is normal. No respiratory distress.     Breath sounds: Normal breath sounds. No wheezing or rales.  Musculoskeletal: Normal range of motion.  Lymphadenopathy:     Cervical: No cervical adenopathy.  Skin:    General: Skin is warm and dry.     Findings: No erythema or rash.  Neurological:     Mental Status: He is alert and oriented to person, place, and time.  Psychiatric:        Behavior: Behavior normal.     Comments: Well groomed, good eye contact, normal speech and thoughts    Recent Labs    06/18/18 12/30/18 1603  HGBA1C 7.0 7.5*    Results for orders placed or performed in visit on 12/30/18  POCT HgB A1C  Result Value Ref Range   Hemoglobin A1C 7.5 (A) 4.0 - 5.6 %      Assessment & Plan:   Problem List Items Addressed This Visit    Erectile dysfunction    Chronic stable ED Secondary to ASCVD, DM HTN  Printed rx Sildenafil - goodrx coupons given, he will try retail pharmacy again, after [facility] closed      Relevant Medications   sildenafil (REVATIO) 20 MG tablet   Type 2 diabetes mellitus with other specified complication (HCC) - Primary    Increased A1c up to 7.5 - likely due to some poor dietary adherence Concern with  persistent hyperglycemia with elevated A1c, still near goal 7.0 No evidence of hypoglycemia Complications - DM cataracts, glaucoma, other including atherosclerotic vascular disease - increases risk of future cardiovascular complications   Plan: 1. CONTINUE Metformin XR 500mg  daily - discussed that no change for now if he can improve lifestyle, if not then may need to go back to BID dosing 2. Encourage improved lifestyle - low carb, low sugar diet, reduce portion size, continue improving regular exercise - reviewed diet recommendation 3. Check CBG, bring log to next visit for review 4. Continue ACEi, ASA - future reconsider starting statin - declined today 5. Follow-up 6 months annual phys w/ labs - if still elevated A1c will consider adjust metformin or other agent at that time.      Relevant Orders   POCT HgB A1C (Completed)      Meds ordered this encounter  Medications  . sildenafil (REVATIO) 20 MG tablet    Sig: Take 2-3 pills about 30 min prior to sex. Start with 2 and increase as needed.    Dispense:  30 tablet    Refill:  5    Follow up plan: Return in about 6 months (around 06/30/2019) for Annual Physical.  Future labs typically ordered through [facility]. He will submit [place] results from outside lab.  Saralyn Pilar, DO [facility] 12/30/2018, 3:42 PM

## 2018-12-30 NOTE — Patient Instructions (Addendum)
Thank you for coming to the office today.  Recent Labs    06/18/18 12/30/18 1603  HGBA1C 7.0 7.5*   Increased A1c - may continue with current treatment for now - if still elevated by next visit in 6 months we can discuss options to increase Metformin again vs consider add other medicine as well.  Please schedule and return for a NURSE ONLY VISIT for VACCINE - Anytime - Need Prevnar-13 (initial Pneumonia Vaccine >age 66) - then 1 year due for Pneumovax-23 - 2nd final dose  OR we can get pneumonia vaccine at next visit in Summer if you prefer.  DUE for FASTING BLOOD WORK (no food or drink after midnight before the lab appointment, only water or coffee without cream/sugar on the morning of)  SCHEDULE "Lab Only" visit in the morning at the clinic for lab draw in 6 MONTHS   - Make sure Lab Only appointment is at about 1 week before your next appointment, so that results will be available  For Lab Results, once available within 2-3 days of blood draw, you can can log in to MyChart online to view your results and a brief explanation. Also, we can discuss results at next follow-up visit.  Please schedule a Follow-up Appointment to: Return in about 6 months (around 06/30/2019) for Annual Physical.  If you have any other questions or concerns, please feel free to call the office or send a message through MyChart. You may also schedule an earlier appointment if necessary.  Additionally, you may be receiving a survey about your experience at our office within a few days to 1 week by e-mail or mail. We value your feedback.  Saralyn Pilar, DO [facility], [place]

## 2018-12-30 NOTE — Assessment & Plan Note (Signed)
Chronic stable ED Secondary to ASCVD, DM HTN  Printed rx Sildenafil - goodrx coupons given, he will try retail pharmacy again, after [facility] closed

## 2019-02-12 DIAGNOSIS — H401130 Primary open-angle glaucoma, bilateral, stage unspecified: Secondary | ICD-10-CM | POA: Diagnosis not present

## 2019-02-19 DIAGNOSIS — H401130 Primary open-angle glaucoma, bilateral, stage unspecified: Secondary | ICD-10-CM | POA: Diagnosis not present

## 2019-05-03 ENCOUNTER — Emergency Department: Payer: BLUE CROSS/BLUE SHIELD

## 2019-05-03 ENCOUNTER — Inpatient Hospital Stay
Admission: EM | Admit: 2019-05-03 | Discharge: 2019-05-07 | DRG: 286 | Disposition: A | Discharge status: Other Acute Inpatient Hospital | Payer: BLUE CROSS/BLUE SHIELD | Attending: Internal Medicine | Admitting: Internal Medicine

## 2019-05-03 ENCOUNTER — Other Ambulatory Visit: Payer: Self-pay

## 2019-05-03 DIAGNOSIS — J9600 Acute respiratory failure, unspecified whether with hypoxia or hypercapnia: Secondary | ICD-10-CM

## 2019-05-03 DIAGNOSIS — I472 Ventricular tachycardia: Principal | ICD-10-CM | POA: Diagnosis present

## 2019-05-03 DIAGNOSIS — I110 Hypertensive heart disease with heart failure: Secondary | ICD-10-CM | POA: Diagnosis present

## 2019-05-03 DIAGNOSIS — Z7982 Long term (current) use of aspirin: Secondary | ICD-10-CM

## 2019-05-03 DIAGNOSIS — N179 Acute kidney failure, unspecified: Secondary | ICD-10-CM | POA: Diagnosis not present

## 2019-05-03 DIAGNOSIS — G931 Anoxic brain damage, not elsewhere classified: Secondary | ICD-10-CM | POA: Diagnosis present

## 2019-05-03 DIAGNOSIS — I428 Other cardiomyopathies: Secondary | ICD-10-CM | POA: Diagnosis not present

## 2019-05-03 DIAGNOSIS — E1165 Type 2 diabetes mellitus with hyperglycemia: Secondary | ICD-10-CM | POA: Diagnosis not present

## 2019-05-03 DIAGNOSIS — J189 Pneumonia, unspecified organism: Secondary | ICD-10-CM | POA: Diagnosis not present

## 2019-05-03 DIAGNOSIS — R079 Chest pain, unspecified: Secondary | ICD-10-CM | POA: Diagnosis not present

## 2019-05-03 DIAGNOSIS — I5022 Chronic systolic (congestive) heart failure: Secondary | ICD-10-CM | POA: Diagnosis not present

## 2019-05-03 DIAGNOSIS — I999 Unspecified disorder of circulatory system: Secondary | ICD-10-CM | POA: Diagnosis not present

## 2019-05-03 DIAGNOSIS — Z7984 Long term (current) use of oral hypoglycemic drugs: Secondary | ICD-10-CM

## 2019-05-03 DIAGNOSIS — N529 Male erectile dysfunction, unspecified: Secondary | ICD-10-CM | POA: Diagnosis not present

## 2019-05-03 DIAGNOSIS — E872 Acidosis, unspecified: Secondary | ICD-10-CM

## 2019-05-03 DIAGNOSIS — Z8249 Family history of ischemic heart disease and other diseases of the circulatory system: Secondary | ICD-10-CM

## 2019-05-03 DIAGNOSIS — I420 Dilated cardiomyopathy: Secondary | ICD-10-CM | POA: Diagnosis not present

## 2019-05-03 DIAGNOSIS — R4020 Unspecified coma: Secondary | ICD-10-CM | POA: Diagnosis not present

## 2019-05-03 DIAGNOSIS — R570 Cardiogenic shock: Secondary | ICD-10-CM | POA: Diagnosis present

## 2019-05-03 DIAGNOSIS — I4901 Ventricular fibrillation: Secondary | ICD-10-CM | POA: Diagnosis present

## 2019-05-03 DIAGNOSIS — G9340 Encephalopathy, unspecified: Secondary | ICD-10-CM | POA: Diagnosis not present

## 2019-05-03 DIAGNOSIS — R001 Bradycardia, unspecified: Secondary | ICD-10-CM | POA: Diagnosis not present

## 2019-05-03 DIAGNOSIS — I459 Conduction disorder, unspecified: Secondary | ICD-10-CM | POA: Diagnosis present

## 2019-05-03 DIAGNOSIS — Z86010 Personal history of colonic polyps: Secondary | ICD-10-CM | POA: Diagnosis not present

## 2019-05-03 DIAGNOSIS — Z79899 Other long term (current) drug therapy: Secondary | ICD-10-CM

## 2019-05-03 DIAGNOSIS — I5042 Chronic combined systolic (congestive) and diastolic (congestive) heart failure: Secondary | ICD-10-CM | POA: Diagnosis not present

## 2019-05-03 DIAGNOSIS — Z03818 Encounter for observation for suspected exposure to other biological agents ruled out: Secondary | ICD-10-CM | POA: Diagnosis not present

## 2019-05-03 DIAGNOSIS — Z1159 Encounter for screening for other viral diseases: Secondary | ICD-10-CM

## 2019-05-03 DIAGNOSIS — I10 Essential (primary) hypertension: Secondary | ICD-10-CM | POA: Diagnosis not present

## 2019-05-03 DIAGNOSIS — R008 Other abnormalities of heart beat: Secondary | ICD-10-CM | POA: Diagnosis not present

## 2019-05-03 DIAGNOSIS — E559 Vitamin D deficiency, unspecified: Secondary | ICD-10-CM | POA: Diagnosis present

## 2019-05-03 DIAGNOSIS — Z833 Family history of diabetes mellitus: Secondary | ICD-10-CM | POA: Diagnosis not present

## 2019-05-03 DIAGNOSIS — H409 Unspecified glaucoma: Secondary | ICD-10-CM | POA: Diagnosis present

## 2019-05-03 DIAGNOSIS — K429 Umbilical hernia without obstruction or gangrene: Secondary | ICD-10-CM | POA: Diagnosis not present

## 2019-05-03 DIAGNOSIS — J9601 Acute respiratory failure with hypoxia: Secondary | ICD-10-CM | POA: Diagnosis present

## 2019-05-03 DIAGNOSIS — Z4659 Encounter for fitting and adjustment of other gastrointestinal appliance and device: Secondary | ICD-10-CM

## 2019-05-03 DIAGNOSIS — E119 Type 2 diabetes mellitus without complications: Secondary | ICD-10-CM | POA: Diagnosis not present

## 2019-05-03 DIAGNOSIS — Z4682 Encounter for fitting and adjustment of non-vascular catheter: Secondary | ICD-10-CM | POA: Diagnosis not present

## 2019-05-03 DIAGNOSIS — I469 Cardiac arrest, cause unspecified: Secondary | ICD-10-CM | POA: Diagnosis not present

## 2019-05-03 LAB — BASIC METABOLIC PANEL
Anion gap: 16 — ABNORMAL HIGH (ref 5–15)
BUN: 17 mg/dL (ref 8–23)
CO2: 17 mmol/L — ABNORMAL LOW (ref 22–32)
Calcium: 8.6 mg/dL — ABNORMAL LOW (ref 8.9–10.3)
Chloride: 104 mmol/L (ref 98–111)
Creatinine, Ser: 1.68 mg/dL — ABNORMAL HIGH (ref 0.61–1.24)
GFR calc Af Amer: 49 mL/min — ABNORMAL LOW (ref >60)
GFR calc non Af Amer: 42 mL/min — ABNORMAL LOW (ref >60)
Glucose, Bld: 318 mg/dL — ABNORMAL HIGH (ref 70–99)
Potassium: 3.7 mmol/L (ref 3.5–5.1)
Sodium: 137 mmol/L (ref 135–145)

## 2019-05-03 LAB — BLOOD GAS, ARTERIAL
Acid-base deficit: 6 mmol/L — ABNORMAL HIGH (ref 0.0–2.0)
Bicarbonate: 19.6 mmol/L — ABNORMAL LOW (ref 20.0–28.0)
FIO2: 0.6
MECHVT: 500 mL
Mechanical Rate: 18
O2 Saturation: 95.8 %
PEEP: 5 cmH2O
Patient temperature: 37
pCO2 arterial: 38 mmHg (ref 32.0–48.0)
pH, Arterial: 7.32 — ABNORMAL LOW (ref 7.350–7.450)
pO2, Arterial: 87 mmHg (ref 83.0–108.0)

## 2019-05-03 LAB — CBC
HCT: 40.6 % (ref 39.0–52.0)
Hemoglobin: 13.4 g/dL (ref 13.0–17.0)
MCH: 30.3 pg (ref 26.0–34.0)
MCHC: 33 g/dL (ref 30.0–36.0)
MCV: 91.9 fL (ref 80.0–100.0)
Platelets: 184 10*3/uL (ref 150–400)
RBC: 4.42 MIL/uL (ref 4.22–5.81)
RDW: 14.5 % (ref 11.5–15.5)
WBC: 14.9 10*3/uL — ABNORMAL HIGH (ref 4.0–10.5)
nRBC: 0 % (ref 0.0–0.2)

## 2019-05-03 LAB — GLUCOSE, CAPILLARY: Glucose-Capillary: 205 mg/dL — ABNORMAL HIGH (ref 70–99)

## 2019-05-03 LAB — URINE DRUG SCREEN, QUALITATIVE (ARMC ONLY)
Amphetamines, Ur Screen: NOT DETECTED
Barbiturates, Ur Screen: NOT DETECTED
Benzodiazepine, Ur Scrn: POSITIVE — AB
Cannabinoid 50 Ng, Ur ~~LOC~~: NOT DETECTED
Cocaine Metabolite,Ur ~~LOC~~: NOT DETECTED
MDMA (Ecstasy)Ur Screen: NOT DETECTED
Methadone Scn, Ur: NOT DETECTED
Opiate, Ur Screen: NOT DETECTED
Phencyclidine (PCP) Ur S: NOT DETECTED
Tricyclic, Ur Screen: NOT DETECTED

## 2019-05-03 LAB — LACTIC ACID, PLASMA: Lactic Acid, Venous: 7.6 mmol/L (ref 0.5–1.9)

## 2019-05-03 LAB — TROPONIN I: Troponin I: 0.03 ng/mL (ref <0.03)

## 2019-05-03 LAB — SARS CORONAVIRUS 2 BY RT PCR (HOSPITAL ORDER, PERFORMED IN ~~LOC~~ HOSPITAL LAB): SARS Coronavirus 2: NEGATIVE

## 2019-05-03 MED ORDER — FENTANYL 2500MCG IN NS 250ML (10MCG/ML) PREMIX INFUSION
0.0000 ug/h | INTRAVENOUS | Status: DC
  Administered 2019-05-04: 10 ug/h via INTRAVENOUS
  Administered 2019-05-04 – 2019-05-05 (×2): 100 ug/h via INTRAVENOUS
  Filled 2019-05-03 (×3): qty 250

## 2019-05-03 MED ORDER — CISATRACURIUM BOLUS VIA INFUSION: 0.0500 mg/kg | INTRAVENOUS | Status: DC | PRN

## 2019-05-03 MED ORDER — ASPIRIN 300 MG RE SUPP
300.0000 mg | RECTAL | Status: AC
  Administered 2019-05-04: 300 mg via RECTAL
  Filled 2019-05-03: qty 1

## 2019-05-03 MED ORDER — FENTANYL BOLUS VIA INFUSION
25.0000 ug | INTRAVENOUS | Status: DC | PRN
  Filled 2019-05-03: qty 25

## 2019-05-03 MED ORDER — PROPOFOL 1000 MG/100ML IV EMUL
0.0000 ug/kg/min | INTRAVENOUS | Status: DC
  Administered 2019-05-03: 20 ug/kg/min via INTRAVENOUS
  Administered 2019-05-04 (×2): 40 ug/kg/min via INTRAVENOUS
  Administered 2019-05-04: 20 ug/kg/min via INTRAVENOUS
  Administered 2019-05-04: 40 ug/kg/min via INTRAVENOUS
  Filled 2019-05-03 (×3): qty 100

## 2019-05-03 MED ORDER — NOREPINEPHRINE 4 MG/250ML-% IV SOLN: 0.0000 ug/min | INTRAVENOUS | Status: DC

## 2019-05-03 MED ORDER — AMIODARONE HCL IN DEXTROSE 360-4.14 MG/200ML-% IV SOLN
60.0000 mg/h | INTRAVENOUS | Status: DC
  Administered 2019-05-03: 22:00:00 60 mg/h via INTRAVENOUS
  Filled 2019-05-03: qty 200

## 2019-05-03 MED ORDER — AMIODARONE HCL IN DEXTROSE 360-4.14 MG/200ML-% IV SOLN
60.0000 mg/h | INTRAVENOUS | Status: DC
  Administered 2019-05-04: 60 mg/h via INTRAVENOUS
  Filled 2019-05-03: qty 200

## 2019-05-03 MED ORDER — AMIODARONE HCL IN DEXTROSE 360-4.14 MG/200ML-% IV SOLN: 30.0000 mg/h | INTRAVENOUS | Status: DC

## 2019-05-03 MED ORDER — HEPARIN BOLUS VIA INFUSION
4000.0000 [IU] | Freq: Once | INTRAVENOUS | Status: AC
  Administered 2019-05-03: 4000 [IU] via INTRAVENOUS
  Filled 2019-05-03: qty 4000

## 2019-05-03 MED ORDER — PROPOFOL BOLUS VIA INFUSION
0.5000 mg/kg | INTRAVENOUS | Status: DC | PRN
  Filled 2019-05-03: qty 37

## 2019-05-03 MED ORDER — CISATRACURIUM BOLUS VIA INFUSION: 0.1000 mg/kg | Freq: Once | INTRAVENOUS | Status: DC

## 2019-05-03 MED ORDER — PANTOPRAZOLE SODIUM 40 MG IV SOLR
40.0000 mg | Freq: Every day | INTRAVENOUS | Status: DC
  Administered 2019-05-04 (×2): 40 mg via INTRAVENOUS
  Filled 2019-05-03 (×2): qty 40

## 2019-05-03 MED ORDER — HEPARIN (PORCINE) 25000 UT/250ML-% IV SOLN
1050.0000 [IU]/h | INTRAVENOUS | Status: DC
  Administered 2019-05-03: 1150 [IU]/h via INTRAVENOUS
  Filled 2019-05-03: qty 250

## 2019-05-03 MED ORDER — POLYVINYL ALCOHOL 1.4 % OP SOLN
2.0000 [drp] | Freq: Three times a day (TID) | OPHTHALMIC | Status: DC
  Administered 2019-05-04 – 2019-05-07 (×9): 2 [drp] via OPHTHALMIC
  Filled 2019-05-03 (×2): qty 15

## 2019-05-03 MED ORDER — SODIUM CHLORIDE 0.9 % IV SOLN: 1.0000 ug/kg/min | INTRAVENOUS | Status: DC

## 2019-05-03 MED ORDER — PROPOFOL 1000 MG/100ML IV EMUL: 25.0000 ug/kg/min | INTRAVENOUS | Status: DC

## 2019-05-03 MED ORDER — FENTANYL CITRATE (PF) 100 MCG/2ML IJ SOLN
50.0000 ug | Freq: Once | INTRAMUSCULAR | Status: AC
  Administered 2019-05-04: 50 ug via INTRAVENOUS
  Filled 2019-05-03: qty 2

## 2019-05-03 MED ORDER — SODIUM CHLORIDE 0.9 % IV SOLN
INTRAVENOUS | Status: DC
  Administered 2019-05-04: 03:00:00 via INTRAVENOUS

## 2019-05-03 MED ORDER — AMIODARONE HCL IN DEXTROSE 360-4.14 MG/200ML-% IV SOLN
30.0000 mg/h | INTRAVENOUS | Status: DC
  Administered 2019-05-04: 30 mg/h via INTRAVENOUS
  Filled 2019-05-03: qty 200

## 2019-05-03 NOTE — ED Notes (Signed)
ED TO INPATIENT HANDOFF REPORT  ED Nurse Name and Phone #: Raphael Gibney Name/Age/Gender Russell [name]. 68 y.o. male Room/Bed: ED01A/ED01A  Code Status   Code Status: Not on file  Home/SNF/Other Home {Patient oriented to:22149:::1 Is this baseline? No   Triage Complete: Triage complete  Chief Complaint Unresponsive  Triage Note Pt with witnessed cardiac arrest, ems states pt with approx 4-6 min down time. cpr started by bystanders, fire gave three shocks, ems states vtach on their arrival, given 2 more shocks. Pt intubated with 8.0ETT, IO to left tibia, 16 g lac, ems gave NS bolus, 2 epi amps and  versed.    Allergies No Known Allergies  Level of Care/Admitting Diagnosis ED Disposition    ED Disposition Condition Comment   Admit  The patient appears reasonably stabilized for admission considering the current resources, flow, and capabilities available in the ED at this time, and I doubt any other [facility] requiring further screening and/or treatment in the ED prior to admission is  present.       B Medical/Surgery History Past Medical History:  Diagnosis Date  . Impotence of organic origin   . Pain in joint, shoulder region   . Personal history of colonic polyps    Past Surgical History:  Procedure Laterality Date  . CARDIAC CATHETERIZATION  11/17/2013  . COLONOSCOPY  2009     A IV Location/Drains/Wounds Patient Lines/Drains/Airways Status   Active Line/Drains/Airways    Name:   Placement date:   Placement time:   Site:   Days:   Peripheral IV 05/03/19 Left Antecubital   05/03/19    -    Antecubital   less than 1   Peripheral IV 05/03/19 Right Antecubital   05/03/19    2200    Antecubital   less than 1   Peripheral IV 05/03/19 Left Hand   05/03/19    2212    Hand   less than 1   Peripheral IV 05/03/19   05/03/19    2228    -   less than 1   NG/OG Tube Nasogastric Left nare Aucultation   05/03/19    2229    Left nare   less than 1   Urethral Catheter jann, ed  tech   05/03/19    2214    -   less than 1   Airway 8 mm   05/03/19    -     less than 1          Intake/Output Last 24 hours No intake or output data in the 24 hours ending 05/03/19 2345  Labs/Imaging Results for orders placed or performed during the hospital encounter of 05/03/19 (from the past 48 hour(s))  Glucose, capillary     Status: Abnormal   Collection Time: 05/03/19  9:59 PM  Result Value Ref Range   Glucose-Capillary 205 (H) 70 - 99 mg/dL  Basic metabolic panel     Status: Abnormal   Collection Time: 05/03/19 10:07 PM  Result Value Ref Range   Sodium 137 135 - 145 mmol/L   Potassium 3.7 3.5 - 5.1 mmol/L   Chloride 104 98 - 111 mmol/L   CO2 17 (L) 22 - 32 mmol/L   Glucose, Bld 318 (H) 70 - 99 mg/dL   BUN 17 8 - 23 mg/dL   Creatinine, Ser 1.61 (H) 0.61 - 1.24 mg/dL   Calcium 8.6 (L) 8.9 - 10.3 mg/dL   GFR calc non Af Amer 42 (  L) >60 mL/min   GFR calc Af Amer 49 (L) >60 mL/min   Anion gap 16 (H) 5 - 15    Comment: Performed at [facility], [address] [place], [place] [address]  CBC     Status: Abnormal   Collection Time: 05/03/19 10:07 PM  Result Value Ref Range   WBC 14.9 (H) 4.0 - 10.5 K/uL   RBC 4.42 4.22 - 5.81 MIL/uL   Hemoglobin 13.4 13.0 - 17.0 g/dL   HCT 34.7 42.5 - 95.6 %   MCV 91.9 80.0 - 100.0 fL   MCH 30.3 26.0 - 34.0 pg   MCHC 33.0 30.0 - 36.0 g/dL   RDW 38.7 56.4 - 33.2 %   Platelets 184 150 - 400 K/uL   nRBC 0.0 0.0 - 0.2 %    Comment: Performed at [facility], [address] [place], [place] [address]  Troponin I - Once     Status: Abnormal   Collection Time: 05/03/19 10:07 PM  Result Value Ref Range   Troponin I 0.03 (HH) <0.03 ng/mL    Comment: CRITICAL RESULT CALLED TO, READ BACK BY AND VERIFIED WITH Marializ Ferrebee ON 05/03/19 AT 2304 [facility] Performed at [facility], [address] [place], [place] [address]   SARS Coronavirus 2 (CEPHEID - Performed in [facility] hospital lab), Hosp Order     Status:  None   Collection Time: 05/03/19 10:07 PM  Result Value Ref Range   SARS Coronavirus 2 NEGATIVE NEGATIVE    Comment: (NOTE) If result is NEGATIVE SARS-CoV-2 target nucleic acids are NOT DETECTED. The SARS-CoV-2 RNA is generally detectable in upper and lower  respiratory specimens during the acute phase of infection. The lowest  concentration of SARS-CoV-2 viral copies this assay can detect is 250  copies / mL. A negative result does not preclude SARS-CoV-2 infection  and should not be used as the sole basis for treatment or other  patient management decisions.  A negative result may occur with  improper specimen collection / handling, submission of specimen other  than nasopharyngeal swab, presence of viral mutation(s) within the  areas targeted by this assay, and inadequate number of viral copies  (<250 copies / mL). A negative result must be combined with clinical  observations, patient history, and epidemiological information. If result is POSITIVE SARS-CoV-2 target nucleic acids are DETECTED. The SARS-CoV-2 RNA is generally detectable in upper and lower  respiratory specimens dur ing the acute phase of infection.  Positive  results are indicative of active infection with SARS-CoV-2.  Clinical  correlation with patient history and other diagnostic information is  necessary to determine patient infection status.  Positive results do  not rule out bacterial infection or co-infection with other viruses. If result is PRESUMPTIVE POSTIVE SARS-CoV-2 nucleic acids MAY BE PRESENT.   A presumptive positive result was obtained on the submitted specimen  and confirmed on repeat testing.  While 2019 novel coronavirus  (SARS-CoV-2) nucleic acids may be present in the submitted sample  additional confirmatory testing may be necessary for epidemiological  and / or clinical management purposes  to differentiate between  SARS-CoV-2 and other Sarbecovirus currently known to infect humans.  If  clinically indicated additional testing with an alternate test  methodology [phone]) is advised. The SARS-CoV-2 RNA is generally  detectable in upper and lower respiratory sp ecimens during the acute  phase of infection. The expected result is Negative. Fact Sheet for Patients:  BoilerBrush.com.cy Fact Sheet for Healthcare  Providers: https://pope.com/ This test is not yet approved or cleared by the [place] and has been authorized for detection and/or diagnosis of SARS-CoV-2 by FDA under an Emergency Use Authorization (EUA).  This EUA will remain in effect (meaning this test can be used) for the duration of the COVID-19 declaration under Section 564(b)(1) of the Act, 21 U.S.C. section 360bbb-3(b)(1), unless the authorization is terminated or revoked sooner. Performed at [facility], [address] [place], [place] [address]   Lactic acid, plasma     Status: Abnormal   Collection Time: 05/03/19 10:07 PM  Result Value Ref Range   Lactic Acid, Venous 7.6 (HH) 0.5 - 1.9 mmol/L    Comment: CRITICAL RESULT CALLED TO, READ BACK BY AND VERIFIED WITH Zinedine Ellner ON 05/03/19 AT 2304 [facility] Performed at [facility], [address], [place], [place] [address]   Urine Drug Screen, Qualitative ([facility] only)     Status: Abnormal   Collection Time: 05/03/19 10:07 PM  Result Value Ref Range   Tricyclic, Ur Screen NONE DETECTED NONE DETECTED   Amphetamines, Ur Screen NONE DETECTED NONE DETECTED   MDMA (Ecstasy)Ur Screen NONE DETECTED NONE DETECTED   Cocaine Metabolite,Ur [place] NONE DETECTED NONE DETECTED   Opiate, Ur Screen NONE DETECTED NONE DETECTED   Phencyclidine (PCP) Ur S NONE DETECTED NONE DETECTED   Cannabinoid 50 Ng, Ur [place] NONE DETECTED NONE DETECTED   Barbiturates, Ur Screen NONE DETECTED NONE DETECTED   Benzodiazepine, Ur Scrn POSITIVE (A) NONE DETECTED   Methadone Scn, Ur NONE DETECTED NONE DETECTED     Comment: (NOTE) Tricyclics + metabolites, urine    Cutoff 1000 ng/mL Amphetamines + metabolites, urine  Cutoff 1000 ng/mL MDMA (Ecstasy), urine              Cutoff 500 ng/mL Cocaine Metabolite, urine          Cutoff 300 ng/mL Opiate + metabolites, urine        Cutoff 300 ng/mL Phencyclidine (PCP), urine         Cutoff 25 ng/mL Cannabinoid, urine                 Cutoff 50 ng/mL Barbiturates + metabolites, urine  Cutoff 200 ng/mL Benzodiazepine, urine              Cutoff 200 ng/mL Methadone, urine                   Cutoff 300 ng/mL The urine drug screen provides only a preliminary, unconfirmed analytical test result and should not be used for non-medical purposes. Clinical consideration and professional judgment should be applied to any positive drug screen result due to possible interfering substances. A more specific alternate chemical method must be used in order to obtain a confirmed analytical result. Gas chromatography / mass spectrometry (GC/MS) is the preferred confirmat ory method. Performed at [facility], [address] [place], [place] [address]   Blood gas, arterial     Status: Abnormal   Collection Time: 05/03/19 10:45 PM  Result Value Ref Range   FIO2 0.60    Delivery systems VENTILATOR    Mode ASSIST CONTROL    VT 500 mL   Peep/cpap 5.0 cm H20   pH, Arterial 7.32 (L) 7.350 - 7.450   pCO2 arterial 38 32.0 - 48.0 mmHg   pO2, Arterial 87 83.0 - 108.0 mmHg   Bicarbonate 19.6 (L) 20.0 - 28.0 mmol/L   Acid-base deficit 6.0 (H) 0.0 -  2.0 mmol/L   O2 Saturation 95.8 %   Patient temperature 37.0    Collection site RIGHT RADIAL    Sample type ARTERIAL DRAW    Allens test (pass/fail) PASS PASS   Mechanical Rate 18     Comment: Performed at [facility], [address] [place], [place] [address]   Ct Head Wo Contrast  Result Date: 05/03/2019 CLINICAL DATA:  68 y/o M; altered level of consciousness. Witnessed cardiac arrest, down approximately 4-6  minutes, CPR. EXAM: CT HEAD WITHOUT CONTRAST TECHNIQUE: Contiguous axial images were obtained from the base of the skull through the vertex without intravenous contrast. COMPARISON:  None. FINDINGS: Brain: No evidence of acute infarction, hemorrhage, hydrocephalus, extra-axial collection or mass lesion/mass effect. Few nonspecific white matter hypodensities are compatible with mild chronic microvascular ischemic changes and there is mild volume loss of the brain. Vascular: Calcific atherosclerosis of internal carotid arteries. No hyperdense vessel identified. Skull: Normal. Negative for fracture or focal lesion. Sinuses/Orbits: No acute finding. Other: None. IMPRESSION: 1. No acute intracranial abnormality identified. 2. Mild chronic microvascular ischemic changes and volume loss of the brain. Electronically Signed   By: Mitzi Hansen M.D.   On: 05/03/2019 23:38   Dg Chest Port 1 View  Result Date: 05/03/2019 CLINICAL DATA:  68 y/o  M; cardiac arrest and intubation. EXAM: PORTABLE CHEST 1 VIEW COMPARISON:  11/13/2013 chest radiograph. FINDINGS: Stable cardiac silhouette given projection and technique. Endotracheal tube tip projects 4.1 cm above the carina. Enteric tube tip extends below the field of view into the abdomen. Transcutaneous pacing pads. Diffuse pulmonary vascular congestion. No focal consolidation. No pleural effusion or pneumothorax. No acute osseous abnormality is evident. IMPRESSION: Endotracheal tube tip projects 4.1 cm above the carina. Enteric tube tip extends below the field of view into the abdomen. Diffuse pulmonary vascular congestion. Electronically Signed   By: Mitzi Hansen M.D.   On: 05/03/2019 22:47    Pending Labs Unresulted Labs (From admission, onward)    Start     Ordered   05/03/19 2209  Protime-INR  ONCE - STAT,   STAT     05/03/19 2208   05/03/19 2209  APTT  ONCE - STAT,   STAT     05/03/19 2208          Vitals/Pain Today's Vitals    05/03/19 2250 05/03/19 2300 05/03/19 2302 05/03/19 2330  BP: (!) 123/91 121/88  111/82  Pulse:      Resp: (!) 27 20  20   Temp: (!) 96.2 F (35.7 C) (!) 96.4 F (35.8 C)    TempSrc:      SpO2:      Weight:      PainSc:   4      Isolation Precautions No active isolations  Medications Medications  amiodarone (NEXTERONE PREMIX) 360-4.14 MG/200ML-% (1.8 mg/mL) IV infusion (60 mg/hr Intravenous New Bag/Given 05/03/19 2208)  amiodarone (NEXTERONE PREMIX) 360-4.14 MG/200ML-% (1.8 mg/mL) IV infusion (has no administration in time range)  propofol (DIPRIVAN) 1000 MG/100ML infusion (10 mcg/kg/min  72.6 kg Intravenous Rate/Dose Change 05/03/19 2213)  heparin ADULT infusion 100 units/mL (25000 units/258mL sodium chloride 0.45%) (1,150 Units/hr Intravenous New Bag/Given 05/03/19 2214)  heparin bolus via infusion 4,000 Units (4,000 Units Intravenous Bolus from Bag 05/03/19 2213)    Mobility walks     Focused Assessments    R Recommendations: See Admitting Provider Note  Report given to:   Additional Notes: none

## 2019-05-03 NOTE — ED Triage Notes (Addendum)
Pt with witnessed cardiac arrest, ems states pt with approx 4-6 min down time. cpr started by bystanders, fire gave three shocks, ems states vtach on their arrival, given 2 more shocks. Pt intubated with 8.0ETT, IO to left tibia, 16 g lac, ems gave NS bolus, 2 epi amps and 4mg  versed. Pt with ROSC

## 2019-05-03 NOTE — ED Notes (Signed)
Heparin bolus and drip verified with [employer], rn

## 2019-05-03 NOTE — ED Provider Notes (Signed)
[facility] Emergency Department Provider Note       Time seen: ----------------------------------------- 9:59 PM on 05/03/2019 ----------------------------------------- Level V caveat: History/ROS limited by unresponsiveness, post cardiac arrest  I have reviewed the triage vital signs and the nursing notes.  HISTORY   Chief Complaint Cardiac Arrest    HPI Russell [name]. is a 68 y.o. male with a history of vascular disease, diabetes, dilated cardiomyopathy, hypertension who presents to the ED for cardiac arrest.  According to report he was playing baseball, had some chest pain and subsequently arrested.  He had bystander CPR, fire department arrived and provided defibrillation.  He had a total of 5 defibrillations between prehospital fire department and EMS.  EMS intubated him with an 8 oh ET tube.  After defibrillations he had return of spontaneous circulation, he did require Versed 4 mg in route.  Past Medical History:  Diagnosis Date  . Impotence of organic origin   . Pain in joint, shoulder region   . Personal history of colonic polyps     Patient Active Problem List   Diagnosis Date Noted  . Atherosclerotic vascular disease 08/22/2017  . Vitamin D deficiency 04/17/2017  . Phobia 03/30/2016  . Elevated PSA 02/17/2016  . Type 2 diabetes mellitus with other specified complication (HCC) 11/12/2015  . Erectile dysfunction 07/20/2014  . Congestive dilated cardiomyopathy (HCC) 11/06/2013  . Essential hypertension 10/23/2013  . Abnormal EKG 10/23/2013  . Encounter for screening colonoscopy 08/04/2013    Past Surgical History:  Procedure Laterality Date  . CARDIAC CATHETERIZATION  11/17/2013  . COLONOSCOPY  2009    Allergies Patient has no known allergies.  Social History Social History   Tobacco Use  . Smoking status: Never Smoker  . Smokeless tobacco: Never Used  Substance Use Topics  . Alcohol use: Yes    Comment: occassional  . Drug  use: No    Review of Systems Unknown at this time, he is unresponsive  All systems negative/normal/unremarkable except as stated in the HPI  ____________________________________________   PHYSICAL EXAM:  VITAL SIGNS: ED Triage Vitals  Enc Vitals Group     BP      Pulse      Resp      Temp      Temp src      SpO2      Weight      Height      Head Circumference      Peak Flow      Pain Score      Pain Loc      Pain Edu?      Excl. in GC?     Constitutional: Unresponsive and intubated, GCS 3 Eyes: Conjunctivae are normal.  ENT      Head: Normocephalic and atraumatic.      Nose: No congestion/rhinnorhea.      Mouth/Throat: Mucous membranes are moist.  ET tube in place      Neck: No stridor. Cardiovascular: Normal rate, regular rhythm. No murmurs, rubs, or gallops. Respiratory: Breath sounds equal bilaterally with bagging through the ET tube Gastrointestinal:  Normal bowel sounds Musculoskeletal: Normal range of motion of the extremities Neurologic: GCS is 3T Skin:  Skin is warm, dry and intact. No rash noted. Psychiatric: Unresponsive ____________________________________________  EKG: Interpreted by me.Sinus rhythm with rate of 97 bpm, trigeminy, wide QRS, long QT, ischemic changes but no ST elevation is noted  ____________________________________________  ED COURSE:  As part of my medical decision making, I  reviewed the following data within the electronic MEDICAL RECORD NUMBER History obtained from family if available, nursing notes, old chart and ekg, as well as notes from prior ED visits. Patient presented for post cardiac arrest, we will assess with labs and imaging as indicated at this time.   Procedures  Russell Joy. was evaluated in Emergency Department on 05/03/2019 for the symptoms described in the history of present illness. He was evaluated in the context of the global COVID-19 pandemic, which necessitated consideration that the patient might be at risk  for infection with the SARS-CoV-2 virus that causes COVID-19. Institutional protocols and algorithms that pertain to the evaluation of patients at risk for COVID-19 are in a state of rapid change based on information released by regulatory bodies including the CDC and federal and state organizations. These policies and algorithms were followed during the patient's care in the ED.  ____________________________________________   LABS (pertinent positives/negatives)  Labs Reviewed  GLUCOSE, CAPILLARY - Abnormal; Notable for the following components:      Result Value   Glucose-Capillary 205 (*)    All other components within normal limits  BASIC METABOLIC PANEL - Abnormal; Notable for the following components:   CO2 17 (*)    Glucose, Bld 318 (*)    Creatinine, Ser 1.68 (*)    Calcium 8.6 (*)    GFR calc non Af Amer 42 (*)    GFR calc Af Amer 49 (*)    Anion gap 16 (*)    All other components within normal limits  CBC - Abnormal; Notable for the following components:   WBC 14.9 (*)    All other components within normal limits  TROPONIN I - Abnormal; Notable for the following components:   Troponin I 0.03 (*)    All other components within normal limits  LACTIC ACID, PLASMA - Abnormal; Notable for the following components:   Lactic Acid, Venous 7.6 (*)    All other components within normal limits  URINE DRUG SCREEN, QUALITATIVE ([facility] ONLY) - Abnormal; Notable for the following components:   Benzodiazepine, Ur Scrn POSITIVE (*)    All other components within normal limits  BLOOD GAS, ARTERIAL - Abnormal; Notable for the following components:   pH, Arterial 7.32 (*)    Bicarbonate 19.6 (*)    Acid-base deficit 6.0 (*)    All other components within normal limits  SARS CORONAVIRUS 2 (HOSPITAL ORDER, PERFORMED IN [facility])  PROTIME-INR  APTT   CRITICAL CARE Performed by: Ulice Dash   Total critical care time: 30 minutes  Critical care time was exclusive  of separately billable procedures and treating other patients.  Critical care was necessary to treat or prevent imminent or life-threatening deterioration.  Critical care was time spent personally by me on the following activities: development of treatment plan with patient and/or surrogate as well as nursing, discussions with consultants, evaluation of patient's response to treatment, examination of patient, obtaining history from patient or surrogate, ordering and performing treatments and interventions, ordering and review of laboratory studies, ordering and review of radiographic studies, pulse oximetry and re-evaluation of patient's condition.  RADIOLOGY Images were viewed by me  Chest x-ray IMPRESSION: Endotracheal tube tip projects 4.1 cm above the carina. Enteric tube tip extends below the field of view into the abdomen. Diffuse pulmonary vascular congestion. ____________________________________________   DIFFERENTIAL DIAGNOSIS   Cardiopulmonary arrest, MI, arrhythmia, substance abuse  FINAL ASSESSMENT AND PLAN  Cardiopulmonary arrest, lactic acidosis   Plan:  The patient had presented status post cardiopulmonary arrest. Patient's labs are still pending at this time. Patient's imaging is unremarkable.  Patient was placed on amiodarone drips as well as heparin drip and propofol.  I have been in discussion with cardiology concerning his EKG, he is not felt to be a STEMI or need emergent catheterization but may need this soon.  He remained stable, discussion was had with the ICU doctor about hypothermia protocol.  I will discuss with the hospitalist for admission.   Ulice Dash, MD    Note: This note was generated in part or whole with voice recognition software. Voice recognition is usually quite accurate but there are transcription errors that can and very often do occur. I apologize for any typographical errors that were not detected and corrected.     Emily Filbert, MD 05/03/19 2312

## 2019-05-04 ENCOUNTER — Encounter: Payer: Self-pay | Admitting: Radiology

## 2019-05-04 ENCOUNTER — Inpatient Hospital Stay
Admit: 2019-05-04 | Discharge: 2019-05-04 | Disposition: A | Discharge status: Home / Self Care | Payer: BLUE CROSS/BLUE SHIELD | Attending: Family Medicine | Admitting: Family Medicine

## 2019-05-04 ENCOUNTER — Inpatient Hospital Stay: Payer: BLUE CROSS/BLUE SHIELD

## 2019-05-04 DIAGNOSIS — I428 Other cardiomyopathies: Secondary | ICD-10-CM

## 2019-05-04 DIAGNOSIS — I469 Cardiac arrest, cause unspecified: Secondary | ICD-10-CM

## 2019-05-04 DIAGNOSIS — R001 Bradycardia, unspecified: Secondary | ICD-10-CM

## 2019-05-04 DIAGNOSIS — I5042 Chronic combined systolic (congestive) and diastolic (congestive) heart failure: Secondary | ICD-10-CM

## 2019-05-04 DIAGNOSIS — I420 Dilated cardiomyopathy: Secondary | ICD-10-CM

## 2019-05-04 LAB — BASIC METABOLIC PANEL
Anion gap: 8 (ref 5–15)
Anion gap: 7 (ref 5–15)
BUN: 19 mg/dL (ref 8–23)
BUN: 18 mg/dL (ref 8–23)
CO2: 24 mmol/L (ref 22–32)
CO2: 24 mmol/L (ref 22–32)
Calcium: 9 mg/dL (ref 8.9–10.3)
Calcium: 8.4 mg/dL — ABNORMAL LOW (ref 8.9–10.3)
Chloride: 105 mmol/L (ref 98–111)
Chloride: 108 mmol/L (ref 98–111)
Creatinine, Ser: 1.09 mg/dL (ref 0.61–1.24)
Creatinine, Ser: 0.94 mg/dL (ref 0.61–1.24)
GFR calc Af Amer: >60 mL/min (ref >60)
GFR calc Af Amer: >60 mL/min (ref >60)
GFR calc non Af Amer: >60 mL/min (ref >60)
GFR calc non Af Amer: >60 mL/min (ref >60)
Glucose, Bld: 242 mg/dL — ABNORMAL HIGH (ref 70–99)
Glucose, Bld: 177 mg/dL — ABNORMAL HIGH (ref 70–99)
Potassium: 4.5 mmol/L (ref 3.5–5.1)
Potassium: 4.2 mmol/L (ref 3.5–5.1)
Sodium: 137 mmol/L (ref 135–145)
Sodium: 139 mmol/L (ref 135–145)

## 2019-05-04 LAB — BLOOD GAS, ARTERIAL
Acid-base deficit: 3.1 mmol/L — ABNORMAL HIGH (ref 0.0–2.0)
Bicarbonate: 23.2 mmol/L (ref 20.0–28.0)
FIO2: 0.6
MECHVT: 500 mL
Mechanical Rate: 18
O2 Saturation: 99.2 %
PEEP: 5 cmH2O
Patient temperature: 31.3
pCO2 arterial: 45 mmHg (ref 32.0–48.0)
pH, Arterial: 7.4 (ref 7.350–7.450)
pO2, Arterial: 121 mmHg — ABNORMAL HIGH (ref 83.0–108.0)

## 2019-05-04 LAB — BRAIN NATRIURETIC PEPTIDE: B Natriuretic Peptide: 78 pg/mL (ref 0.0–100.0)

## 2019-05-04 LAB — GLUCOSE, CAPILLARY
Glucose-Capillary: 229 mg/dL — ABNORMAL HIGH (ref 70–99)
Glucose-Capillary: 173 mg/dL — ABNORMAL HIGH (ref 70–99)
Glucose-Capillary: 139 mg/dL — ABNORMAL HIGH (ref 70–99)
Glucose-Capillary: 122 mg/dL — ABNORMAL HIGH (ref 70–99)
Glucose-Capillary: 133 mg/dL — ABNORMAL HIGH (ref 70–99)
Glucose-Capillary: 144 mg/dL — ABNORMAL HIGH (ref 70–99)
Glucose-Capillary: 151 mg/dL — ABNORMAL HIGH (ref 70–99)
Glucose-Capillary: 523 mg/dL (ref 70–99)
Glucose-Capillary: 190 mg/dL — ABNORMAL HIGH (ref 70–99)
Glucose-Capillary: 216 mg/dL — ABNORMAL HIGH (ref 70–99)
Glucose-Capillary: 155 mg/dL — ABNORMAL HIGH (ref 70–99)
Glucose-Capillary: 138 mg/dL — ABNORMAL HIGH (ref 70–99)
Glucose-Capillary: 174 mg/dL — ABNORMAL HIGH (ref 70–99)
Glucose-Capillary: 141 mg/dL — ABNORMAL HIGH (ref 70–99)
Glucose-Capillary: 129 mg/dL — ABNORMAL HIGH (ref 70–99)
Glucose-Capillary: 124 mg/dL — ABNORMAL HIGH (ref 70–99)
Glucose-Capillary: 125 mg/dL — ABNORMAL HIGH (ref 70–99)
Glucose-Capillary: 110 mg/dL — ABNORMAL HIGH (ref 70–99)
Glucose-Capillary: 83 mg/dL (ref 70–99)

## 2019-05-04 LAB — CBC
HCT: 40.4 % (ref 39.0–52.0)
Hemoglobin: 13.6 g/dL (ref 13.0–17.0)
MCH: 30 pg (ref 26.0–34.0)
MCHC: 33.7 g/dL (ref 30.0–36.0)
MCV: 89.2 fL (ref 80.0–100.0)
Platelets: 175 10*3/uL (ref 150–400)
RBC: 4.53 MIL/uL (ref 4.22–5.81)
RDW: 14.4 % (ref 11.5–15.5)
WBC: 13.2 10*3/uL — ABNORMAL HIGH (ref 4.0–10.5)
nRBC: 0 % (ref 0.0–0.2)

## 2019-05-04 LAB — HEPARIN LEVEL (UNFRACTIONATED)
Heparin Unfractionated: 0.84 IU/mL — ABNORMAL HIGH (ref 0.30–0.70)
Heparin Unfractionated: 1 IU/mL — ABNORMAL HIGH (ref 0.30–0.70)
Heparin Unfractionated: 0.51 IU/mL (ref 0.30–0.70)

## 2019-05-04 LAB — PROTIME-INR
INR: 1.1 (ref 0.8–1.2)
INR: 1.1 (ref 0.8–1.2)
Prothrombin Time: 13.6 seconds (ref 11.4–15.2)
Prothrombin Time: 14 seconds (ref 11.4–15.2)

## 2019-05-04 LAB — APTT
aPTT: 84 seconds — ABNORMAL HIGH (ref 24–36)
aPTT: 144 seconds — ABNORMAL HIGH (ref 24–36)

## 2019-05-04 LAB — HEMOGLOBIN A1C
Hgb A1c MFr Bld: 7.4 % — ABNORMAL HIGH (ref 4.8–5.6)
Mean Plasma Glucose: 165.68 mg/dL

## 2019-05-04 LAB — PROCALCITONIN: Procalcitonin: 1.78 ng/mL

## 2019-05-04 LAB — PHOSPHORUS: Phosphorus: 3.7 mg/dL (ref 2.5–4.6)

## 2019-05-04 LAB — TROPONIN I
Troponin I: 0.69 ng/mL (ref <0.03)
Troponin I: 0.66 ng/mL (ref <0.03)
Troponin I: 0.68 ng/mL (ref <0.03)
Troponin I: 0.5 ng/mL (ref <0.03)

## 2019-05-04 LAB — MAGNESIUM: Magnesium: 2.2 mg/dL (ref 1.7–2.4)

## 2019-05-04 LAB — LACTIC ACID, PLASMA: Lactic Acid, Venous: 1.2 mmol/L (ref 0.5–1.9)

## 2019-05-04 LAB — MRSA PCR SCREENING: MRSA by PCR: POSITIVE — AB

## 2019-05-04 LAB — FIBRIN DERIVATIVES D-DIMER (ARMC ONLY): Fibrin derivatives D-dimer (ARMC): >10000 ng/mL (FEU) — ABNORMAL HIGH (ref 0.00–499.00)

## 2019-05-04 MED ORDER — DOPAMINE-DEXTROSE 3.2-5 MG/ML-% IV SOLN
0.0000 ug/kg/min | INTRAVENOUS | Status: DC
  Administered 2019-05-04: 5 ug/kg/min via INTRAVENOUS

## 2019-05-04 MED ORDER — CARVEDILOL 25 MG PO TABS
25.0000 mg | ORAL_TABLET | Freq: Two times a day (BID) | ORAL | Status: DC
  Filled 2019-05-04: qty 2

## 2019-05-04 MED ORDER — VANCOMYCIN HCL 10 G IV SOLR
1750.0000 mg | Freq: Once | INTRAVENOUS | Status: AC
  Administered 2019-05-04: 1750 mg via INTRAVENOUS
  Filled 2019-05-04 (×2): qty 1750

## 2019-05-04 MED ORDER — INSULIN ASPART 100 UNIT/ML ~~LOC~~ SOLN
0.0000 [IU] | SUBCUTANEOUS | Status: DC
  Administered 2019-05-04: 2 [IU] via SUBCUTANEOUS
  Administered 2019-05-04: 3 [IU] via SUBCUTANEOUS
  Administered 2019-05-04 – 2019-05-05 (×3): 2 [IU] via SUBCUTANEOUS
  Filled 2019-05-04 (×5): qty 1

## 2019-05-04 MED ORDER — CHLORHEXIDINE GLUCONATE 0.12% ORAL RINSE (MEDLINE KIT)
15.0000 mL | Freq: Two times a day (BID) | OROMUCOSAL | Status: DC
  Administered 2019-05-04 – 2019-05-06 (×6): 15 mL via OROMUCOSAL

## 2019-05-04 MED ORDER — HEPARIN (PORCINE) 25000 UT/250ML-% IV SOLN
900.0000 [IU]/h | INTRAVENOUS | Status: DC
  Administered 2019-05-04: 900 [IU]/h via INTRAVENOUS
  Filled 2019-05-04: qty 250

## 2019-05-04 MED ORDER — PIPERACILLIN-TAZOBACTAM 3.375 G IVPB 30 MIN: 3.3750 g | Freq: Four times a day (QID) | INTRAVENOUS | Status: DC

## 2019-05-04 MED ORDER — LATANOPROST 0.005 % OP SOLN
1.0000 [drp] | Freq: Every day | OPHTHALMIC | Status: DC
  Administered 2019-05-04 – 2019-05-06 (×4): 1 [drp] via OPHTHALMIC
  Filled 2019-05-04 (×2): qty 2.5

## 2019-05-04 MED ORDER — CARVEDILOL 25 MG PO TABS: 25.0000 mg | ORAL_TABLET | Freq: Two times a day (BID) | ORAL | Status: DC

## 2019-05-04 MED ORDER — SODIUM CHLORIDE 0.9 % IV SOLN
250.0000 mL | INTRAVENOUS | Status: DC
  Administered 2019-05-04: 250 mL via INTRAVENOUS

## 2019-05-04 MED ORDER — VANCOMYCIN HCL IN DEXTROSE 1-5 GM/200ML-% IV SOLN: 1000.0000 mg | Freq: Two times a day (BID) | INTRAVENOUS | Status: DC

## 2019-05-04 MED ORDER — MIDAZOLAM HCL 2 MG/2ML IJ SOLN
1.0000 mg | INTRAMUSCULAR | Status: DC | PRN
  Administered 2019-05-04 – 2019-05-05 (×2): 2 mg via INTRAVENOUS
  Filled 2019-05-04 (×2): qty 2

## 2019-05-04 MED ORDER — LACTATED RINGERS IV SOLN
INTRAVENOUS | Status: DC
  Administered 2019-05-04: 05:00:00 via INTRAVENOUS

## 2019-05-04 MED ORDER — IOHEXOL 350 MG/ML SOLN: 75.0000 mL | Freq: Once | INTRAVENOUS | Status: DC | PRN

## 2019-05-04 MED ORDER — INSULIN ASPART 100 UNIT/ML ~~LOC~~ SOLN
0.0000 [IU] | SUBCUTANEOUS | Status: DC
  Administered 2019-05-04: 5 [IU] via SUBCUTANEOUS
  Administered 2019-05-04: 2 [IU] via SUBCUTANEOUS
  Filled 2019-05-04 (×2): qty 1

## 2019-05-04 MED ORDER — VECURONIUM BROMIDE 10 MG IV SOLR
10.0000 mg | INTRAVENOUS | Status: DC | PRN
  Filled 2019-05-04: qty 10

## 2019-05-04 MED ORDER — FENTANYL BOLUS VIA INFUSION
25.0000 ug | INTRAVENOUS | Status: DC | PRN
  Administered 2019-05-04: 50 ug via INTRAVENOUS
  Filled 2019-05-04: qty 50

## 2019-05-04 MED ORDER — MIDAZOLAM HCL 2 MG/2ML IJ SOLN
1.0000 mg | INTRAMUSCULAR | Status: DC | PRN
  Administered 2019-05-04 (×2): 2 mg via INTRAVENOUS
  Filled 2019-05-04 (×2): qty 2

## 2019-05-04 MED ORDER — VANCOMYCIN HCL IN DEXTROSE 750-5 MG/150ML-% IV SOLN
750.0000 mg | Freq: Two times a day (BID) | INTRAVENOUS | Status: DC
  Filled 2019-05-04: qty 150

## 2019-05-04 MED ORDER — PIPERACILLIN-TAZOBACTAM 3.375 G IVPB
3.3750 g | Freq: Three times a day (TID) | INTRAVENOUS | Status: DC
  Administered 2019-05-04: 3.375 g via INTRAVENOUS
  Filled 2019-05-04: qty 50

## 2019-05-04 MED ORDER — SODIUM BICARBONATE 8.4 % IV SOLN
INTRAVENOUS | Status: AC
  Administered 2019-05-04: 50 meq
  Filled 2019-05-04: qty 50

## 2019-05-04 MED ORDER — ORAL CARE MOUTH RINSE
15.0000 mL | OROMUCOSAL | Status: DC
  Administered 2019-05-04 – 2019-05-06 (×19): 15 mL via OROMUCOSAL

## 2019-05-04 MED ORDER — IOPAMIDOL (ISOVUE-370) INJECTION 76%
100.0000 mL | Freq: Once | INTRAVENOUS | Status: AC | PRN
  Administered 2019-05-04: 100 mL via INTRAVENOUS

## 2019-05-04 MED ORDER — NOREPINEPHRINE 4 MG/250ML-% IV SOLN
0.0000 ug/min | INTRAVENOUS | Status: DC
  Filled 2019-05-04: qty 250

## 2019-05-04 MED ORDER — NOREPINEPHRINE 16 MG/250ML-% IV SOLN
0.0000 ug/min | INTRAVENOUS | Status: DC
  Administered 2019-05-04 (×2): 20 ug/min via INTRAVENOUS
  Filled 2019-05-04 (×3): qty 250

## 2019-05-04 MED ORDER — STERILE WATER FOR INJECTION IJ SOLN
INTRAMUSCULAR | Status: AC
  Administered 2019-05-04: 04:00:00
  Filled 2019-05-04: qty 10

## 2019-05-04 MED ORDER — ATROPINE SULFATE 1 MG/10ML IJ SOSY
PREFILLED_SYRINGE | INTRAMUSCULAR | Status: AC
  Administered 2019-05-04: 1 mg via INTRAVENOUS
  Filled 2019-05-04: qty 10

## 2019-05-04 MED ORDER — ATROPINE SULFATE 1 MG/10ML IJ SOSY
0.5000 mg | PREFILLED_SYRINGE | INTRAMUSCULAR | Status: DC | PRN
  Administered 2019-05-04: 06:00:00 1 mg via INTRAVENOUS

## 2019-05-04 MED ORDER — SODIUM CHLORIDE 0.9 % IV SOLN
INTRAVENOUS | Status: DC
  Administered 2019-05-04: 10 mL/h via INTRAVENOUS

## 2019-05-04 MED ORDER — TIMOLOL MALEATE 0.5 % OP SOLN
1.0000 [drp] | Freq: Every day | OPHTHALMIC | Status: DC
  Administered 2019-05-04 – 2019-05-07 (×4): 1 [drp] via OPHTHALMIC
  Filled 2019-05-04 (×2): qty 5

## 2019-05-04 MED ORDER — NOREPINEPHRINE 4 MG/250ML-% IV SOLN
2.0000 ug/min | INTRAVENOUS | Status: DC
  Administered 2019-05-04: 10 ug/min via INTRAVENOUS

## 2019-05-04 MED ORDER — DOPAMINE-DEXTROSE 3.2-5 MG/ML-% IV SOLN
INTRAVENOUS | Status: AC
  Administered 2019-05-04: 5 ug/kg/min via INTRAVENOUS
  Filled 2019-05-04: qty 250

## 2019-05-04 NOTE — Consult Note (Addendum)
PULMONARY / CRITICAL CARE MEDICINE  Name: Russell [name]. MRN: [id number] DOB: 04-Dec-1951    LOS: 1  Referring Provider:  Dr Arville Care Reason for Referral:  Cardiac arrest and targeted temperature management Brief patient description:  68 y/o male admitted following a VF witnessed arrest while playing baseball.   HPI: This is a 68 y/o AA male with T2DM, dilated cardiomyopathy,  Systolic heart failure, CAD and hypertension who presented to the ED via EMS after an out of hospital witnessed cardiac arrest. History is obtained from ED records as patient is currently intubated and sedated.  Per ED records, patient was playing baseball when he suddenly developed chest pain, collapsed and became unresponsive.  Bystanders started CPR prior to EMS arrival.  When EMS arrived, patient was in V. fib and was defibrillated x5.  He was intubated on the field by EMS and given 4 mg of Versed and route to the hospital.  Total downtime is approximately 5 minutes.  At the ED, patient was moving his extremities but had no purposeful movements hence targeted temperature management was initiated.  Is being admitted to the ICU for further management.  He was last seen by Dr. Mariah Milling in July 2019.  Last echo in 2014 showed an EF of 25-30%. Last LHC done same year showed no significant CAD with improved EF to 40%.  Past Medical History:  Diagnosis Date  . Impotence of organic origin   . Pain in joint, shoulder region   . Personal history of colonic polyps    Past Surgical History:  Procedure Laterality Date  . CARDIAC CATHETERIZATION  11/17/2013  . COLONOSCOPY  2009   No current facility-administered medications on file prior to encounter.    Current Outpatient Medications on File Prior to Encounter  Medication Sig  . Acetaminophen (TYLENOL PO) Take by mouth as needed.  Marland Kitchen amLODipine (NORVASC) 10 MG tablet Take 1 tablet (10 mg total) by mouth daily.  Marland Kitchen aspirin EC 81 MG tablet Take 1 tablet (81 mg total) by mouth  daily.  . carvedilol (COREG) 25 MG tablet Take 1 tablet (25 mg total) by mouth 2 (two) times daily.  . cetirizine (ZYRTEC) 10 MG tablet Take 10 mg by mouth daily.  . Cholecalciferol (VITAMIN D3) 2000 units TABS Take by mouth.  . latanoprost (XALATAN) 0.005 % ophthalmic solution   . lisinopril (PRINIVIL,ZESTRIL) 40 MG tablet Take 1 tablet (40 mg total) by mouth daily.  . metFORMIN (GLUCOPHAGE-XR) 500 MG 24 hr tablet Take 1 tablet (500 mg total) by mouth daily with breakfast.  . sildenafil (REVATIO) 20 MG tablet Take 2-3 pills about 30 min prior to sex. Start with 2 and increase as needed.  . timolol (TIMOPTIC) 0.5 % ophthalmic solution     Allergies No Known Allergies  Family History Family History  Problem Relation Age of Onset  . Colon cancer Father        1970's  . Hypertension Mother   . Diabetes Mother    Social History  reports that he has never smoked. He has never used smokeless tobacco. He reports current alcohol use. He reports that he does not use drugs.  Review Of Systems: Unable to obtain as patient is currently intubated and sedated  VITAL SIGNS: BP 115/77   Pulse 99   Temp (!) 96.4 F (35.8 C)   Resp 18   Wt 72.6 kg   SpO2 96%   BMI 24.33 kg/m   HEMODYNAMICS:    VENTILATOR SETTINGS: Vent Mode: AC  FiO2 (%):  [60 %] 60 % Set Rate:  [18 bmp] 18 bmp Vt Set:  [500 mL] 500 mL PEEP:  [5 cmH20] 5 cmH20  INTAKE / OUTPUT: No intake/output data recorded.  PHYSICAL EXAMINATION: General: Well-nourished, well-developed, appropriate for age, sedated HEENT: PERRLA, trachea midline, moderate amount of secretions and oral cavity Neuro: Withdraws to noxious stimulus, moves all extremities Cardiovascular: Apical pulse regular, S1-S2, no murmur regurg or gallop, +2 pulses bilaterally, no edema Lungs: Bilateral breath sounds without any wheezes or rhonchi, diminished in bases Abdomen: Nondistended, normal bowel sounds in all 4 quadrants, palpation reveals no  organomegaly Musculoskeletal: No joint deformities, positive range of motion Skin: Warm and dry  LABS:  BMET Recent Labs  Lab 05/03/19 2207  NA 137  K 3.7  CL 104  CO2 17*  BUN 17  CREATININE 1.68*  GLUCOSE 318*    Electrolytes Recent Labs  Lab 05/03/19 2207  CALCIUM 8.6*    CBC Recent Labs  Lab 05/03/19 2207  WBC 14.9*  HGB 13.4  HCT 40.6  PLT 184    Coag's No results for input(s): APTT, INR in the last 168 hours.  Sepsis Markers Recent Labs  Lab 05/03/19 2207  LATICACIDVEN 7.6*    ABG Recent Labs  Lab 05/03/19 2245  PHART 7.32*  PCO2ART 38  PO2ART 87    Liver Enzymes No results for input(s): AST, ALT, ALKPHOS, BILITOT, ALBUMIN in the last 168 hours.  Cardiac Enzymes Recent Labs  Lab 05/03/19 2207  TROPONINI 0.03*    Glucose Recent Labs  Lab 05/03/19 2159  GLUCAP 205*    Imaging Ct Head Wo Contrast  Result Date: 05/03/2019 CLINICAL DATA:  68 y/o M; altered level of consciousness. Witnessed cardiac arrest, down approximately 4-6 minutes, CPR. EXAM: CT HEAD WITHOUT CONTRAST TECHNIQUE: Contiguous axial images were obtained from the base of the skull through the vertex without intravenous contrast. COMPARISON:  None. FINDINGS: Brain: No evidence of acute infarction, hemorrhage, hydrocephalus, extra-axial collection or mass lesion/mass effect. Few nonspecific white matter hypodensities are compatible with mild chronic microvascular ischemic changes and there is mild volume loss of the brain. Vascular: Calcific atherosclerosis of internal carotid arteries. No hyperdense vessel identified. Skull: Normal. Negative for fracture or focal lesion. Sinuses/Orbits: No acute finding. Other: None. IMPRESSION: 1. No acute intracranial abnormality identified. 2. Mild chronic microvascular ischemic changes and volume loss of the brain. Electronically Signed   By: Mitzi Hansen M.D.   On: 05/03/2019 23:38   Dg Chest Port 1 View  Result Date:  05/03/2019 CLINICAL DATA:  68 y/o  M; cardiac arrest and intubation. EXAM: PORTABLE CHEST 1 VIEW COMPARISON:  11/13/2013 chest radiograph. FINDINGS: Stable cardiac silhouette given projection and technique. Endotracheal tube tip projects 4.1 cm above the carina. Enteric tube tip extends below the field of view into the abdomen. Transcutaneous pacing pads. Diffuse pulmonary vascular congestion. No focal consolidation. No pleural effusion or pneumothorax. No acute osseous abnormality is evident. IMPRESSION: Endotracheal tube tip projects 4.1 cm above the carina. Enteric tube tip extends below the field of view into the abdomen. Diffuse pulmonary vascular congestion. Electronically Signed   By: Mitzi Hansen M.D.   On: 05/03/2019 22:47   Ct Angio Chest/abd/pel For Dissection W And/or W/wo  Result Date: 05/04/2019 CLINICAL DATA:  68 year old male with diabetes and vascular disease. Evaluate for aortic dissection or pulmonary embolism. EXAM: CT ANGIOGRAPHY CHEST, ABDOMEN AND PELVIS TECHNIQUE: Multidetector CT imaging through the chest, abdomen and pelvis was performed using the standard  protocol during bolus administration of intravenous contrast. Multiplanar reconstructed images and MIPs were obtained and reviewed to evaluate the vascular anatomy. CONTRAST:  ISOVUE-370 IOPAMIDOL (ISOVUE-370) INJECTION 76% COMPARISON:  Chest radiograph dated 05/03/2019 FINDINGS: Evaluation is limited due to streak artifact caused by patient's arms. CTA CHEST FINDINGS Cardiovascular: There is moderate cardiomegaly. No pericardial effusion. The thoracic aorta is unremarkable. The visualized origins of the great vessels of the aortic arch appear patent. The central pulmonary arteries are patent. Evaluation of the distal pulmonary arteries is limited due to suboptimal opacification. No large or central pulmonary artery embolus identified. Mediastinum/Nodes: No hilar or mediastinal adenopathy. An enteric tube is noted  within the esophagus. The thyroid gland is grossly unremarkable. No mediastinal fluid collection. Lungs/Pleura: Endotracheal tube with tip approximately 2 cm above the carina. Patchy bilateral lower lobe consolidative changes may represent atelectasis versus infiltrate. There is no pleural effusion or pneumothorax. The central airways are patent. Musculoskeletal: Minimally displaced fracture of the anterior right fourth rib. Age indeterminate, likely acute, nondisplaced and incomplete fracture of the sternal body. Review of the MIP images confirms the above findings. CTA ABDOMEN AND PELVIS FINDINGS VASCULAR Aorta: Normal caliber aorta without aneurysm, dissection, vasculitis or significant stenosis. Celiac: Patent without evidence of aneurysm, dissection, vasculitis or significant stenosis. SMA: Patent without evidence of aneurysm, dissection, vasculitis or significant stenosis. Renals: Both renal arteries are patent without evidence of aneurysm, dissection, vasculitis, fibromuscular dysplasia or significant stenosis. IMA: Patent without evidence of aneurysm, dissection, vasculitis or significant stenosis. Inflow: Patent without evidence of aneurysm, dissection, vasculitis or significant stenosis. Veins: No obvious venous abnormality within the limitations of this arterial phase study. Air within the left common femoral vein, likely iatrogenic and related to intravenous access. Review of the MIP images confirms the above findings. NON-VASCULAR No intra-abdominal free air or free fluid. Hepatobiliary: The liver is grossly unremarkable. The gallbladder is partially contracted. No calcified gallstone. Thickened appearance of the gallbladder wall, likely partially related to underdistention. Pancreas: Unremarkable. No pancreatic ductal dilatation or surrounding inflammatory changes. Spleen: Normal in size without focal abnormality. Adrenals/Urinary Tract: Adrenal glands are unremarkable. Kidneys are normal, without  renal calculi, focal lesion, or hydronephrosis. The urinary bladder is decompressed around a Foley catheter. Stomach/Bowel: An enteric tube is noted with tip in the proximal stomach. There is no bowel obstruction. The appendix is unremarkable as visualized. Lymphatic: No adenopathy. Reproductive: The prostate and seminal vesicles are grossly unremarkable. Other: Small fat containing umbilical hernia. Musculoskeletal: No acute or significant osseous findings. Review of the MIP images confirms the above findings. IMPRESSION: 1. No aortic aneurysm or dissection. No CT evidence of central pulmonary artery embolus. 2. Minimally displaced fracture of the anterior right fourth rib and nondisplaced fracture of the sternal body. 3. Moderate cardiomegaly. 4. Bilateral lower lobe consolidative changes may represent atelectasis versus infiltrate. 5. No bowel obstruction or active inflammation. Electronically Signed   By: Elgie Collard M.D.   On: 05/04/2019 00:56   STUDIES:  2D echo pending  CULTURES: Blood cultures x2 Sputum culture> MRSA screen positive  ANTIBIOTICS: Vancomycin 5/9> Zosyn>5/9  SIGNIFICANT EVENTS: 05/03/2019: Admitted  LINES/TUBES: Right femoral central venous catheter Right femoral arterial catheter ET tube Foley catheter NG tube Peripheral IVs  DISCUSSION: 68 year old male presenting with witnessed V. fib arrest; no EKG changes suggestive of ACS recommend outpatient is at high risk, mild CAD on previous cardiac diagnostics; PE ruled out.  Patient is COVID-19 negative   ASSESSMENT / PLAN:  PULMONARY A: Acute hypoxic respiratory failure  Bilateral infiltrates with right greater than left, suspect aspiration versus pneumonia Acute pulmonary edema P:   Full vent support with current settings ABG and chest x-ray as needed Nebulized bronchodilators Weaning trials as tolerated Antibiotics as above Sputum culture  CARDIOVASCULAR A:  V. fib arrest Dilated  cardiomyopathy Congestive heart failure with reduced ejection fraction Sinus bradycardia Cardiogenic shock P:  Hemodynamic monitoring per ICU protocol Dopamine and gentle IV hydration to maintain mean arterial blood pressure greater than 65 Targeted temperature management-change from 33 degrees protocol to 36 degrees protocol Labs prior TTM protocol Atropine 0.5 mg given x2 2D echo pending Cardiology consulted and aware of patient's status Amiodarone infusion RENAL A:   No acute issues P:   Strict I's and O's monitoring Monitor and correct electrolytes  INFECTIOUS A:   Community-acquired pneumonia P:   COVID-19 ruled out Follow-up cultures Continue antibiotics as above Trend procalcitonin and adjust antibiotics  ENDOCRINE A:   Type 2 diabetes-hemoglobin A1c in January 2020 was 7.5% P:   Blood glucose monitoring with sliding scale insulin every 4 hours  NEUROLOGIC A:   Acute encephalopathy secondary to cardiac arrest P:   RASS g oal: 0 to -1 Propofol and fentanyl for sedation and vent discomfort CT head negative Repeat CT head per TTM Protocol EEG pending Neurology consult  Best Practice: Code Status: Full code Diet: N.p.o. GI prophylaxis: Protonix VTE prophylaxis: Heparin infusion and SCDs   Plan of care discussed with Dr. Darrol AngelSimons   FAMILY  - Updates: Wife and daughter updated on by phone.  All questions answered  COVID-19 DISASTER DECLARATION:   FULL CONTACT PHYSICAL EXAMINATION WAS NOT POSSIBLE DUE TO TREATMENT OF COVID-19  AND CONSERVATION OF PERSONAL PROTECTIVE EQUIPMENT, LIMITED EXAM FINDINGS INCLUDE-    Patient assessed or the symptoms described in the history of present illness.  In the context of the Global COVID-19 pandemic, which necessitated consideration that the patient might be at risk for infection with the SARS-CoV-2 virus that causes COVID-19, Institutional protocols and algorithms that pertain to the evaluation of patients at risk for  COVID-19 are in a state of rapid change based on information released by regulatory bodies including the CDC and federal and state organizations. These policies and algorithms were followed during the patient's care while in hospital.  Alahia Whicker S. Tukov-Yual ANP-BC Pulmonary and Critical Care Medicine [facility] Pager [phone] or [phone]  NB: This document was prepared using Dragon voice recognition software and may include unintentional dictation errors.    05/04/2019, 1:40 AM

## 2019-05-04 NOTE — Progress Notes (Signed)
Pharmacy Antibiotic Note  Russell [name]. is a 68 y.o. male admitted on 05/03/2019 with pneumonia.  Pharmacy has been consulted for vancomycin dosing.  Plan: Patient received vanc 1.75g IV load  Vancomycin 750 mg IV Q 12 hrs. Goal AUC 400-550. Expected AUC: 434.8 SCr used: 0.94 Cssmin: 12.6  Will continue zosyn 3.375g IV q8h  Weight: 156 lb 15.5 oz (71.2 kg)  Temp (24hrs), Avg:94.3 F (34.6 C), Min:87.6 F (30.9 C), Max:96.8 F (36 C)  Recent Labs  Lab 05/03/19 2207 05/04/19 0215 05/04/19 0504  WBC 14.9*  --  13.2*  CREATININE 1.68* 1.09 0.94  LATICACIDVEN 7.6*  --   --     Estimated Creatinine Clearance: 75.8 mL/min (by C-G formula based on SCr of 0.94 mg/dL).    No Known Allergies  Thank you for allowing pharmacy to be a part of this patient's care.  Thomasene Ripple, PharmD, BCPS Clinical Pharmacist 05/04/2019

## 2019-05-04 NOTE — Progress Notes (Signed)
Assisted tele visit to patient with wife, son and daughter.  Redmond Pulling, RN

## 2019-05-04 NOTE — Progress Notes (Signed)
ANTICOAGULATION CONSULT NOTE   Pharmacy Consult for heparin drip Indication: chest pain/ACS  No Known Allergies  Patient Measurements: Weight: 156 lb 15.5 oz (71.2 kg) Heparin Dosing Weight: 72.6 kg  Vital Signs: Temp: 96.1 F (35.6 C) (05/10 1200) BP: 137/92 (05/10 1200)  Labs: Recent Labs    05/03/19 2207 05/04/19 0215 05/04/19 0504 05/04/19 0808 05/04/19 1152 05/04/19 1309  HGB 13.4  --  13.6  --   --   --   HCT 40.6  --  40.4  --   --   --   PLT 184  --  175  --   --   --   APTT  --  84*  --  144*  --   --   LABPROT  --  13.6  --  14.0  --   --   INR  --  1.1  --  1.1  --   --   HEPARINUNFRC  --   --  0.84*  --   --  1.00*  CREATININE 1.68* 1.09 0.94  --   --   --   TROPONINI 0.03* 0.69* 0.66*  --  0.68*  --     Estimated Creatinine Clearance: 75.8 mL/min (by C-G formula based on SCr of 0.94 mg/dL).   Medical History: Past Medical History:  Diagnosis Date  . Impotence of organic origin   . Pain in joint, shoulder region   . Personal history of colonic polyps     Medications:  Scheduled:  . chlorhexidine gluconate (MEDLINE KIT)  15 mL Mouth Rinse BID  . insulin aspart  0-15 Units Subcutaneous Q4H  . latanoprost  1 drop Both Eyes QHS  . mouth rinse  15 mL Mouth Rinse 10 times per day  . pantoprazole (PROTONIX) IV  40 mg Intravenous QHS  . polyvinyl alcohol  2 drop Both Eyes Q8H  . timolol  1 drop Both Eyes Daily    Assessment: Patient w/ witnessed cardiac arrest, brought to ED s/p CPR, shock x 3, showing Vtach, patient's initial trop at 0.03, LA 7.6, patient w/ h/o of DM and dilated ischemic cardiomyopathy. No anticoagulation PTA documented. Patient is being started on heparin drip for possible STEMI.  05/10 @ 0500 HL 0.84 supratherapeutic. Will reduce rate to 1050 units/hr and will recheck HL @ 1200, CBC stable; however, trops are rising 0.03 >> 0.69, will continue to monitor.  Goal of Therapy:  Heparin level 0.3-0.7 units/ml Monitor platelets by  anticoagulation protocol: Yes   Plan:  5/10  HL @1309 = 1.00.  Will hold Heparin drip x 1 hour. Will resume at lower rate of 900 units per hour. Will check HL 6 hours after drip resumed.  hgb 13.6  plt [place] PharmD Clinical Pharmacist 05/04/2019

## 2019-05-04 NOTE — H&P (Addendum)
[facility]   PATIENT NAME: Russell [name]    MR#:  [id number]  DATE OF BIRTH:  [birth year]  DATE OF ADMISSION:  05/03/2019  PRIMARY CARE PHYSICIAN: Smitty Cords, DO   REQUESTING/REFERRING PHYSICIAN: Daryel November, MD  CHIEF COMPLAINT:   Chief Complaint  Patient presents with  . Cardiac Arrest    HISTORY OF PRESENT ILLNESS:  Russell [name]  is a 68 y.o. male with a known history of multiple medical problems that will be mentioned below including dilated cardiomyopathy with systolic CHF and hypertension as well as type 2 diabetes mellitus, who presented to the emergency room with acute onset of cardiac arrest.  The patient was playing with his family on the week after eating dinner then he went outside playing baseball.  He then came inside.  He was diaphoretic he went to the bathroom and his family helped him to the couch where he laid his head back and grabbed his wife's and asking her for help.  She immediately called 911 and was advised to start CPR if he was unresponsive.  By the time EMS came.  He was given 1 round of epinephrine 1 mg IV in addition to CPR and 5 DC shocks before he was successfully resuscitated.  He was intubated with #8 ET tube.  He required 4 mg of IV Versed in route to the ER.  No reported chest pain or significant dyspnea or palpitations.  No reported headache or dizziness or blurred vision, paresthesias or focal muscle weakness.  No reported dysuria, oliguria or hematuria or flank pain per his family.  Upon presentation to the emergency room, his blood pressure was 115/88 with a pulse of 99 respiratory rate of 25, PO2 of 96% on bag valve mask 100% FiO2 and his temperature was 95.9.  He was placed on mechanical ventilation his ABG showed pH 7.32, bicarbonate 19.6, PCO2 38 and PO2 of 87 with O2 sat of 95.8% on 60% FiO2.  His labs otherwise were remarkable for hyperglycemia of 318 and a creatinine 1.68 with BUN of 17  anion gap of 16 and troponin I 0.03 with lactic acid of 7.6.  CBC markable for a WBC of 14.9.  His COVID-19 test came back negative.  Urine drug screen came back negative.  His portable chest x-ray revealed diffuse pulmonary vascular congestion with ET tube in place at 4.1 cm above the carina.  And his EKG showed sinus rhythm with a rate of 97 with ventricular trigeminy, nonspecific intraventricular conduction delay, 1.5 mm ST segment elevation in V1 and T wave inversion with ST depression laterally and flattened T waves inferiorly.  Stat head CT scan without contrast came back negative.  The patient was placed on IV propofol for sedation.  Contact was made with Dr. Kirke Corin and the patient was given IV heparin drip as well as IV amiodarone bolus and drip.  He will be admitted to an ICU bed for further evaluation and management. PAST MEDICAL HISTORY:   Past Medical History:  Diagnosis Date  . Impotence of organic origin   . Pain in joint, shoulder region   . Personal history of colonic polyps   *Systolic CHF/dilated cardiomyopathy *Hypertension *Type, who presented to the emergency *Vitamin D deficiency *Glaucoma *Elevated PSA  PAST SURGICAL HISTORY:   Past Surgical History:  Procedure Laterality Date  . CARDIAC CATHETERIZATION  11/17/2013  . COLONOSCOPY  2009    SOCIAL HISTORY:   Social History   Tobacco  Use  . Smoking status: Never Smoker  . Smokeless tobacco: Never Used  Substance Use Topics  . Alcohol use: Yes    Comment: occassional    FAMILY HISTORY:   Family History  Problem Relation Age of Onset  . Colon cancer Father        1970's  . Hypertension Mother   . Diabetes Mother     DRUG ALLERGIES:  No Known Allergies  REVIEW OF SYSTEMS:   ROS per the family: As per history of present illness. All pertinent systems were reviewed above. Constitutional,  HEENT, cardiovascular, respiratory, GI, GU, musculoskeletal, neuro, psychiatric, endocrine,  integumentary and  hematologic systems were reviewed and are otherwise  negative/unremarkable except for positive findings mentioned above in the HPI.  History could be obtained from the patient as he was sedated on mechanical ventilation.   MEDICATIONS AT HOME:   Prior to Admission medications   Medication Sig Start Date End Date Taking? Authorizing Provider  Acetaminophen (TYLENOL PO) Take by mouth as needed.    [provider]  amLODipine (NORVASC) 10 MG tablet Take 1 tablet (10 mg total) by mouth daily. 06/24/18   Karamalegos, Netta NeatAlexander J, DO  aspirin EC 81 MG tablet Take 1 tablet (81 mg total) by mouth daily. 08/22/17   Karamalegos, Netta NeatAlexander J, DO  carvedilol (COREG) 25 MG tablet Take 1 tablet (25 mg total) by mouth 2 (two) times daily. 06/24/18   Karamalegos, Netta NeatAlexander J, DO  cetirizine (ZYRTEC) 10 MG tablet Take 10 mg by mouth daily. 04/13/17   [provider]  Cholecalciferol (VITAMIN D3) 2000 units TABS Take by mouth.    [provider]  latanoprost (XALATAN) 0.005 % ophthalmic solution  05/30/18   [provider]  lisinopril (PRINIVIL,ZESTRIL) 40 MG tablet Take 1 tablet (40 mg total) by mouth daily. 06/24/18   Karamalegos, Netta NeatAlexander J, DO  metFORMIN (GLUCOPHAGE-XR) 500 MG 24 hr tablet Take 1 tablet (500 mg total) by mouth daily with breakfast. 06/24/18   Althea CharonKaramalegos, Netta NeatAlexander J, DO  sildenafil (REVATIO) 20 MG tablet Take 2-3 pills about 30 min prior to sex. Start with 2 and increase as needed. 12/30/18   Karamalegos, Netta NeatAlexander J, DO  timolol (TIMOPTIC) 0.5 % ophthalmic solution  06/18/18   [provider]      VITAL SIGNS:  Blood pressure 111/82, pulse 99, temperature (!) 96.4 F (35.8 C), resp. rate 20, weight 72.6 kg, SpO2 96 %.  PHYSICAL EXAMINATION:  Physical Exam  GENERAL:  68 y.o.-year-old African-American male patient lying in the bed sedated on mechanical ventilation with restlessness during my interview and associated movement of all 4 extremities and  bending the neck before further sedation was given EYES: Pupils equal, round, reactive to light and accommodation. No scleral icterus. Extraocular muscles intact.  HEENT: Head atraumatic, normocephalic. Oropharynx ET tube in place and NG tube in place.  NECK:  Supple, no jugular venous distention. No thyroid enlargement, no tenderness.  LUNGS: Slightly diminished bibasilar breath sounds with bibasal rales. CARDIOVASCULAR: Regular rate and rhythm, S1, S2 normal. No murmurs, rubs, or gallops.  ABDOMEN: Soft, nondistended, nontender. Bowel sounds present. No organomegaly or mass.  EXTREMITIES: No pedal edema, cyanosis, or clubbing.  NEUROLOGIC: He was sedated on mechanical ventilation.  He was moving all 4 extremities and I did not appreciate any lateralizing signs. PSYCHIATRIC: Unable to perform being on mechanical ventilation. SKIN: No obvious rash, lesion, or ulcer.   LABORATORY PANEL:   CBC Recent Labs  Lab 05/03/19 2207  WBC  14.9*  HGB 13.4  HCT 40.6  PLT 184   ------------------------------------------------------------------------------------------------------------------  Chemistries  Recent Labs  Lab 05/03/19 2207  NA 137  K 3.7  CL 104  CO2 17*  GLUCOSE 318*  BUN 17  CREATININE 1.68*  CALCIUM 8.6*   ------------------------------------------------------------------------------------------------------------------  Cardiac Enzymes Recent Labs  Lab 05/03/19 2207  TROPONINI 0.03*   ------------------------------------------------------------------------------------------------------------------  RADIOLOGY:  Ct Head Wo Contrast  Result Date: 05/03/2019 CLINICAL DATA:  68 y/o M; altered level of consciousness. Witnessed cardiac arrest, down approximately 4-6 minutes, CPR. EXAM: CT HEAD WITHOUT CONTRAST TECHNIQUE: Contiguous axial images were obtained from the base of the skull through the vertex without intravenous contrast. COMPARISON:  None. FINDINGS: Brain: No  evidence of acute infarction, hemorrhage, hydrocephalus, extra-axial collection or mass lesion/mass effect. Few nonspecific white matter hypodensities are compatible with mild chronic microvascular ischemic changes and there is mild volume loss of the brain. Vascular: Calcific atherosclerosis of internal carotid arteries. No hyperdense vessel identified. Skull: Normal. Negative for fracture or focal lesion. Sinuses/Orbits: No acute finding. Other: None. IMPRESSION: 1. No acute intracranial abnormality identified. 2. Mild chronic microvascular ischemic changes and volume loss of the brain. Electronically Signed   By: Mitzi Hansen M.D.   On: 05/03/2019 23:38   Dg Chest Port 1 View  Result Date: 05/03/2019 CLINICAL DATA:  68 y/o  M; cardiac arrest and intubation. EXAM: PORTABLE CHEST 1 VIEW COMPARISON:  11/13/2013 chest radiograph. FINDINGS: Stable cardiac silhouette given projection and technique. Endotracheal tube tip projects 4.1 cm above the carina. Enteric tube tip extends below the field of view into the abdomen. Transcutaneous pacing pads. Diffuse pulmonary vascular congestion. No focal consolidation. No pleural effusion or pneumothorax. No acute osseous abnormality is evident. IMPRESSION: Endotracheal tube tip projects 4.1 cm above the carina. Enteric tube tip extends below the field of view into the abdomen. Diffuse pulmonary vascular congestion. Electronically Signed   By: Mitzi Hansen M.D.   On: 05/03/2019 22:47      IMPRESSION AND PLAN:   #1.  Status post cardiac arrest likely ventricular fibrillation arrest given successful resuscitation with 5 DC shocks. -The patient will be maintained on mechanical ventilation on -We obtained a chest and abdomen CTA and it ruled out aortic dissection.  With negative result, the patient will be admitted to an ICU bed.  - He will be continued on hypothermia protocol that was started in the emergency room with hypothermia protocol.    -We will continue him on IV propofol and fentanyl for sedation. -We will continue IV heparin for the possibility of acute coronary syndrome given slightly elevated troponin I. -  We will also continue IV amiodarone drip given his trigeminy and his shockable rhythm requiring 5 DC shocks during resuscitation.  - We will follow serial cardiac enzymes, d-dimer and BNP and obtain a 2D echo and cardiology consultation.  Dr. Kirke Corin was notified and is aware about the patient.  -I discussed the case with the e- link ICU intensivist who is aware about the patient.  2.  Systolic CHF and congestive dilated cardiomyopathy.  A 2D echo as well as cardiology consult will be obtained.  He has no frank interstitial pulmonary edema but just pulmonary vascular congestion.  I will hold off on Lasix at this time given the possibility of hypotension on IV propofol.  3.  Lactic acidosis.  This is likely secondary to his cardiac arrest.  At this time I do not suspect any infectious etiology.  We will still obtain  a urinalysis as well as urine culture and sensitivity if needed and check 2 blood cultures.  His leukocytosis likely related to stress demargination.  Will empirically cover with IV vancomycin and Zosyn.  4.  Type 2 diabetes mellitus with hyperglycemia.  The patient will be placed on supplemental coverage with NovoLog.  Metformin will be held off given mild acute kidney injury.  5.  Hypertension.  Amlodipine and Coreg will be continued when blood pressure is stable.  Lisinopril will be held off given acute kidney injury.  6.  DVT prophylaxis.  This is covered with therapeutic IV heparin.  GI prophylaxis will be provided with IV PPI therapy.   All the records are reviewed and case discussed with ED provider. The plan of care was discussed in details with the patient's family in the conference room. I answered all questions. The patient's family agreed to proceed with the above mentioned plan. Further management  will depend upon hospital course.   CODE STATUS: Full code  TOTAL TIME TAKING CARE OF THIS PATIENT: 80 minutes.    Hannah Beat M.D on 05/04/2019 at 12:24 AM  Pager - [phone]  After 6pm go to [email or web address] - Social research officer, government  [facility]  Office  [phone]  CC: Primary care physician; Smitty Cords, DO   Note: This dictation was prepared with Dragon dictation along with smaller phrase technology. Any transcriptional errors that result from this process are unintentional.

## 2019-05-04 NOTE — Progress Notes (Signed)
eLink Physician-Brief Progress Note Patient Name: Russell [name]. DOB: 05-Jun-1951 MRN: [id number]   Date of Service  05/04/2019  HPI/Events of Note  Bradycardia - HR = 50. Likely secondary to Propofol IV infusion.   eICU Interventions  Will order: 1. D/C Propofol IV infusion.  2. Versed 1-2 mg IV Q 2 hours PRN sedation.      Intervention Category Major Interventions: Arrhythmia - evaluation and management  Marielle Mantione [place] 05/04/2019, 11:05 PM

## 2019-05-04 NOTE — Procedures (Signed)
Arterial Catheter Insertion Procedure Note Kymel Jacka [id number] 07/22/1951  Procedure: Insertion of Arterial Catheter  Indications: Blood pressure monitoring and Frequent blood sampling  Procedure Details Consent: Unable to obtain consent because of emergent medical necessity. Time Out: Verified patient identification, verified procedure, site/side was marked, verified correct patient position, special equipment/implants available, medications/allergies/relevent history reviewed, required imaging and test results available.  Performed  Maximum sterile technique was used including antiseptics, cap, gloves, gown, hand hygiene, mask and sheet. Skin prep: Chlorhexidine; local anesthetic administered 20 gauge catheter was inserted into right femoral artery using the Seldinger technique. ULTRASOUND GUIDANCE USED: YES Evaluation Blood flow good; BP tracing good. Complications: No apparent complications.  Procedure performed under direct supervision of Dr.Simonds. Ultrasound utilized for realtime vessel cannulation  [name] 05/04/2019

## 2019-05-04 NOTE — Progress Notes (Addendum)
Dr. Elease Hashimoto notified of brady heart rate and widening QRS of patients cardiac rhythm. Also  notified of inverted T wave. Order to stop Amiodarone given

## 2019-05-04 NOTE — Consult Note (Signed)
Reason for Consult:s/p arrest Referring Physician: Salary  CC: s/p arrest  HPI: Russell [name]. is an 68 y.o. male who is intubated and sedated therefore unable to provide history.  All history obtained from the chart.  Patient with a history of T2DM, dilated cardiomyopathy, systolic heart failure, CAD and hypertension who presented to the ED via EMS after an out of hospital witnessed cardiac arrest. Patient was playing baseball when he suddenly developed chest pain, collapsed and became unresponsive.  Bystanders started CPR prior to EMS arrival.  When EMS arrived, patient was in V. fib and was defibrillated x5.  He was intubated in the field by EMS and given 4 mg of Versed en route to the hospital.  Total downtime was approximately 5 minutes.  At the ED, patient was moving his extremities but had no purposeful movements hence targeted temperature management was initiated.    Past Medical History:  Diagnosis Date  . Impotence of organic origin   . Pain in joint, shoulder region   . Personal history of colonic polyps     Past Surgical History:  Procedure Laterality Date  . CARDIAC CATHETERIZATION  11/17/2013  . COLONOSCOPY  2009    Family History  Problem Relation Age of Onset  . Colon cancer Father        1970's  . Hypertension Mother   . Diabetes Mother     Social History:  reports that he has never smoked. He has never used smokeless tobacco. He reports current alcohol use. He reports that he does not use drugs.  No Known Allergies  Medications:  I have reviewed the patient's current medications. Prior to Admission:  Medications Prior to Admission  Medication Sig Dispense Refill Last Dose  . Acetaminophen (TYLENOL PO) Take by mouth as needed.   Taking  . amLODipine (NORVASC) 10 MG tablet Take 1 tablet (10 mg total) by mouth daily. 90 tablet 3 Taking  . aspirin EC 81 MG tablet Take 1 tablet (81 mg total) by mouth daily.   Taking  . carvedilol (COREG) 25 MG tablet Take 1 tablet  (25 mg total) by mouth 2 (two) times daily. 180 tablet 3 Taking  . cetirizine (ZYRTEC) 10 MG tablet Take 10 mg by mouth daily.  4 Taking  . Cholecalciferol (VITAMIN D3) 2000 units TABS Take by mouth.   Taking  . latanoprost (XALATAN) 0.005 % ophthalmic solution    Taking  . lisinopril (PRINIVIL,ZESTRIL) 40 MG tablet Take 1 tablet (40 mg total) by mouth daily. 90 tablet 3 Taking  . metFORMIN (GLUCOPHAGE-XR) 500 MG 24 hr tablet Take 1 tablet (500 mg total) by mouth daily with breakfast. 90 tablet 3 Taking  . sildenafil (REVATIO) 20 MG tablet Take 2-3 pills about 30 min prior to sex. Start with 2 and increase as needed. 30 tablet 5   . timolol (TIMOPTIC) 0.5 % ophthalmic solution    Taking   Scheduled: . chlorhexidine gluconate (MEDLINE KIT)  15 mL Mouth Rinse BID  . insulin aspart  0-15 Units Subcutaneous Q4H  . latanoprost  1 drop Both Eyes QHS  . mouth rinse  15 mL Mouth Rinse 10 times per day  . pantoprazole (PROTONIX) IV  40 mg Intravenous QHS  . polyvinyl alcohol  2 drop Both Eyes Q8H  . timolol  1 drop Both Eyes Daily    ROS: Unable to provide  Physical Examination: Blood pressure 109/78, pulse 99, temperature (!) 95.5 F (35.3 C), resp. rate 18, weight 71.2 kg, SpO2  96 %.  HEENT-  Normocephalic, no lesions, without obvious abnormality.  Normal external eye and conjunctiva.  Normal TM's bilaterally.  Normal auditory canals and external ears. Normal external nose, mucus membranes and septum.  Normal pharynx. Cardiovascular- S1, S2 normal, pulses palpable throughout   Lungs- chest clear, no wheezing, rales, normal symmetric air entry Abdomen- soft, non-tender; bowel sounds normal; no masses,  no organomegaly Extremities- no edema Lymph-no adenopathy palpable Musculoskeletal-no joint tenderness, deformity or swelling Skin-warm and dry, no hyperpigmentation, vitiligo, or suspicious lesions  Neurological Examination   Mental Status: Patient does not respond to verbal stimuli.   Does not respond to deep sternal rub.  Does not follow commands.  No verbalizations noted.  Cranial Nerves: II: patient does not respond confrontation bilaterally, pupils right 2 mm, left 2 mm with reactivity difficult to determine III,IV,VI: doll's response absent bilaterally.  V,VII: corneal reflex reduced bilaterally  VIII: patient does not respond to verbal stimuli IX,X: gag reflex reduced, XI: trapezius strength unable to test bilaterally XII: tongue strength unable to test Motor: Extremities flaccid throughout.  No spontaneous movement noted.  No purposeful movements noted. Sensory: Does not respond to noxious stimuli in any extremity. Deep Tendon Reflexes:  Absent throughout. Plantars: Mute bilaterally Cerebellar: Unable to perform   Laboratory Studies:   Basic Metabolic Panel: Recent Labs  Lab 05/03/19 2207 05/04/19 0215 05/04/19 0504  NA 137 137 139  K 3.7 4.5 4.2  CL 104 105 108  CO2 17* 24 24  GLUCOSE 318* 242* 177*  BUN 17 19 18   CREATININE 1.68* 1.09 0.94  CALCIUM 8.6* 9.0 8.4*  MG  --   --  2.2  PHOS  --   --  3.7    Liver Function Tests: No results for input(s): AST, ALT, ALKPHOS, BILITOT, PROT, ALBUMIN in the last 168 hours. No results for input(s): LIPASE, AMYLASE in the last 168 hours. No results for input(s): AMMONIA in the last 168 hours.  CBC: Recent Labs  Lab 05/03/19 2207 05/04/19 0504  WBC 14.9* 13.2*  HGB 13.4 13.6  HCT 40.6 40.4  MCV 91.9 89.2  PLT 184 175    Cardiac Enzymes: Recent Labs  Lab 05/03/19 2207 05/04/19 0215 05/04/19 0504 05/04/19 1152  TROPONINI 0.03* 0.69* 0.66* 0.68*    BNP: Invalid input(s): POCBNP  CBG: Recent Labs  Lab 05/04/19 1116 05/04/19 1150 05/04/19 1258 05/04/19 1259 05/04/19 1309  GLUCAP 133* 144* 505* 523* 151*    Microbiology: Results for orders placed or performed during the hospital encounter of 05/03/19  SARS Coronavirus 2 (CEPHEID - Performed in [facility] lab), Hosp  Order     Status: None   Collection Time: 05/03/19 10:07 PM  Result Value Ref Range Status   SARS Coronavirus 2 NEGATIVE NEGATIVE Final    Comment: (NOTE) If result is NEGATIVE SARS-CoV-2 target nucleic acids are NOT DETECTED. The SARS-CoV-2 RNA is generally detectable in upper and lower  respiratory specimens during the acute phase of infection. The lowest  concentration of SARS-CoV-2 viral copies this assay can detect is 250  copies / mL. A negative result does not preclude SARS-CoV-2 infection  and should not be used as the sole basis for treatment or other  patient management decisions.  A negative result may occur with  improper specimen collection / handling, submission of specimen other  than nasopharyngeal swab, presence of viral mutation(s) within the  areas targeted by this assay, and inadequate number of viral copies  (<250 copies / mL).  A negative result must be combined with clinical  observations, patient history, and epidemiological information. If result is POSITIVE SARS-CoV-2 target nucleic acids are DETECTED. The SARS-CoV-2 RNA is generally detectable in upper and lower  respiratory specimens dur ing the acute phase of infection.  Positive  results are indicative of active infection with SARS-CoV-2.  Clinical  correlation with patient history and other diagnostic information is  necessary to determine patient infection status.  Positive results do  not rule out bacterial infection or co-infection with other viruses. If result is PRESUMPTIVE POSTIVE SARS-CoV-2 nucleic acids MAY BE PRESENT.   A presumptive positive result was obtained on the submitted specimen  and confirmed on repeat testing.  While 2019 novel coronavirus  (SARS-CoV-2) nucleic acids may be present in the submitted sample  additional confirmatory testing may be necessary for epidemiological  and / or clinical management purposes  to differentiate between  SARS-CoV-2 and other Sarbecovirus currently  known to infect humans.  If clinically indicated additional testing with an alternate test  methodology [phone]) is advised. The SARS-CoV-2 RNA is generally  detectable in upper and lower respiratory sp ecimens during the acute  phase of infection. The expected result is Negative. Fact Sheet for Patients:  StrictlyIdeas.no Fact Sheet for Healthcare Providers: BankingDealers.co.za This test is not yet approved or cleared by the [place] FDA and has been authorized for detection and/or diagnosis of SARS-CoV-2 by FDA under an Emergency Use Authorization (EUA).  This EUA will remain in effect (meaning this test can be used) for the duration of the COVID-19 declaration under Section 564(b)(1) of the Act, 21 U.S.C. section 360bbb-3(b)(1), unless the authorization is terminated or revoked sooner. Performed at [facility], [address] [place], [place] [address]   CULTURE, BLOOD (ROUTINE X 2) w Reflex to ID Panel     Status: None (Preliminary result)   Collection Time: 05/04/19  2:15 AM  Result Value Ref Range Status   Specimen Description BLOOD LEFT HAND  Final   Special Requests   Final    BOTTLES DRAWN AEROBIC AND ANAEROBIC Blood Culture results may not be optimal due to an inadequate volume of blood received in culture bottles   Culture   Final    NO GROWTH < 12 HOURS Performed at [facility], [address], [place], [place] [address]    Report Status PENDING  Incomplete  MRSA PCR Screening     Status: Abnormal   Collection Time: 05/04/19  2:16 AM  Result Value Ref Range Status   MRSA by PCR POSITIVE (A) NEGATIVE Final    Comment:        The GeneXpert MRSA Assay (FDA approved for NASAL specimens only), is one component of a comprehensive MRSA colonization surveillance program. It is not intended to diagnose MRSA infection nor to guide or monitor treatment for MRSA infections. RESULT CALLED TO, READ  BACK BY AND VERIFIED WITH: FELICIA PREUHOMME AT 9179 ON 05/04/2019 JJB Performed at [facility], [address] [place], [place] [address]   CULTURE, BLOOD (ROUTINE X 2) w Reflex to ID Panel     Status: None (Preliminary result)   Collection Time: 05/04/19  2:29 AM  Result Value Ref Range Status   Specimen Description BLOOD LEFT FOREARM  Final   Special Requests   Final    BOTTLES DRAWN AEROBIC AND ANAEROBIC Blood Culture results may not be optimal due to an inadequate volume of blood received in culture bottles   Culture  Final    NO GROWTH < 12 HOURS Performed at [facility], [address] [place], [place] [address]    Report Status PENDING  Incomplete    Coagulation Studies: Recent Labs    05/04/19 0215 05/04/19 0808  LABPROT 13.6 14.0  INR 1.1 1.1    Urinalysis: No results for input(s): COLORURINE, LABSPEC, PHURINE, GLUCOSEU, HGBUR, BILIRUBINUR, KETONESUR, PROTEINUR, UROBILINOGEN, NITRITE, LEUKOCYTESUR in the last 168 hours.  Invalid input(s): APPERANCEUR  Lipid Panel:     Component Value Date/Time   CHOL 156 06/18/2018   CHOL 152 04/13/2017 0920   TRIG 89 06/18/2018   TRIG 56 04/13/2017 0920   HDL 70 06/18/2018   HDL 64 04/13/2017 0920   CHOLHDL 2.4 04/13/2017 0920   LDLCALC 68 06/18/2018   LDLCALC 77 04/13/2017 0920    HgbA1C:  Lab Results  Component Value Date   HGBA1C 7.4 (H) 05/04/2019    Urine Drug Screen:      Component Value Date/Time   LABOPIA NONE DETECTED 05/03/2019 2207   COCAINSCRNUR NONE DETECTED 05/03/2019 2207   LABBENZ POSITIVE (A) 05/03/2019 2207   AMPHETMU NONE DETECTED 05/03/2019 2207   THCU NONE DETECTED 05/03/2019 2207   LABBARB NONE DETECTED 05/03/2019 2207    Alcohol Level: No results for input(s): ETH in the last 168 hours.  Other results: EKG: sinus bradycardia at 43 bpm.  Imaging: Dg Abd 1 View  Result Date: 05/04/2019 CLINICAL DATA:  NG tube placement EXAM: ABDOMEN - 1 VIEW COMPARISON:   Radiograph 05/03/2019 FINDINGS: Nasogastric tube extends the stomach. Side port below the GE junction. Normal bowel-gas pattern. Lung bases clear. IMPRESSION: NG tube in proper position. Electronically Signed   By: Suzy Bouchard M.D.   On: 05/04/2019 05:33   Ct Head Wo Contrast  Result Date: 05/03/2019 CLINICAL DATA:  68 y/o M; altered level of consciousness. Witnessed cardiac arrest, down approximately 4-6 minutes, CPR. EXAM: CT HEAD WITHOUT CONTRAST TECHNIQUE: Contiguous axial images were obtained from the base of the skull through the vertex without intravenous contrast. COMPARISON:  None. FINDINGS: Brain: No evidence of acute infarction, hemorrhage, hydrocephalus, extra-axial collection or mass lesion/mass effect. Few nonspecific white matter hypodensities are compatible with mild chronic microvascular ischemic changes and there is mild volume loss of the brain. Vascular: Calcific atherosclerosis of internal carotid arteries. No hyperdense vessel identified. Skull: Normal. Negative for fracture or focal lesion. Sinuses/Orbits: No acute finding. Other: None. IMPRESSION: 1. No acute intracranial abnormality identified. 2. Mild chronic microvascular ischemic changes and volume loss of the brain. Electronically Signed   By: Kristine Garbe M.D.   On: 05/03/2019 23:38   Dg Chest Port 1 View  Result Date: 05/03/2019 CLINICAL DATA:  68 y/o  M; cardiac arrest and intubation. EXAM: PORTABLE CHEST 1 VIEW COMPARISON:  11/13/2013 chest radiograph. FINDINGS: Stable cardiac silhouette given projection and technique. Endotracheal tube tip projects 4.1 cm above the carina. Enteric tube tip extends below the field of view into the abdomen. Transcutaneous pacing pads. Diffuse pulmonary vascular congestion. No focal consolidation. No pleural effusion or pneumothorax. No acute osseous abnormality is evident. IMPRESSION: Endotracheal tube tip projects 4.1 cm above the carina. Enteric tube tip extends below the  field of view into the abdomen. Diffuse pulmonary vascular congestion. Electronically Signed   By: Kristine Garbe M.D.   On: 05/03/2019 22:47   Ct Angio Chest/abd/pel For Dissection W And/or W/wo  Result Date: 05/04/2019 CLINICAL DATA:  68 year old male with diabetes and vascular disease. Evaluate for aortic dissection  or pulmonary embolism. EXAM: CT ANGIOGRAPHY CHEST, ABDOMEN AND PELVIS TECHNIQUE: Multidetector CT imaging through the chest, abdomen and pelvis was performed using the standard protocol during bolus administration of intravenous contrast. Multiplanar reconstructed images and MIPs were obtained and reviewed to evaluate the vascular anatomy. CONTRAST:  121m ISOVUE-370 IOPAMIDOL (ISOVUE-370) INJECTION 76% COMPARISON:  Chest radiograph dated 05/03/2019 FINDINGS: Evaluation is limited due to streak artifact caused by patient's arms. CTA CHEST FINDINGS Cardiovascular: There is moderate cardiomegaly. No pericardial effusion. The thoracic aorta is unremarkable. The visualized origins of the great vessels of the aortic arch appear patent. The central pulmonary arteries are patent. Evaluation of the distal pulmonary arteries is limited due to suboptimal opacification. No large or central pulmonary artery embolus identified. Mediastinum/Nodes: No hilar or mediastinal adenopathy. An enteric tube is noted within the esophagus. The thyroid gland is grossly unremarkable. No mediastinal fluid collection. Lungs/Pleura: Endotracheal tube with tip approximately 2 cm above the carina. Patchy bilateral lower lobe consolidative changes may represent atelectasis versus infiltrate. There is no pleural effusion or pneumothorax. The central airways are patent. Musculoskeletal: Minimally displaced fracture of the anterior right fourth rib. Age indeterminate, likely acute, nondisplaced and incomplete fracture of the sternal body. Review of the MIP images confirms the above findings. CTA ABDOMEN AND PELVIS FINDINGS  VASCULAR Aorta: Normal caliber aorta without aneurysm, dissection, vasculitis or significant stenosis. Celiac: Patent without evidence of aneurysm, dissection, vasculitis or significant stenosis. SMA: Patent without evidence of aneurysm, dissection, vasculitis or significant stenosis. Renals: Both renal arteries are patent without evidence of aneurysm, dissection, vasculitis, fibromuscular dysplasia or significant stenosis. IMA: Patent without evidence of aneurysm, dissection, vasculitis or significant stenosis. Inflow: Patent without evidence of aneurysm, dissection, vasculitis or significant stenosis. Veins: No obvious venous abnormality within the limitations of this arterial phase study. Air within the left common femoral vein, likely iatrogenic and related to intravenous access. Review of the MIP images confirms the above findings. NON-VASCULAR No intra-abdominal free air or free fluid. Hepatobiliary: The liver is grossly unremarkable. The gallbladder is partially contracted. No calcified gallstone. Thickened appearance of the gallbladder wall, likely partially related to underdistention. Pancreas: Unremarkable. No pancreatic ductal dilatation or surrounding inflammatory changes. Spleen: Normal in size without focal abnormality. Adrenals/Urinary Tract: Adrenal glands are unremarkable. Kidneys are normal, without renal calculi, focal lesion, or hydronephrosis. The urinary bladder is decompressed around a Foley catheter. Stomach/Bowel: An enteric tube is noted with tip in the proximal stomach. There is no bowel obstruction. The appendix is unremarkable as visualized. Lymphatic: No adenopathy. Reproductive: The prostate and seminal vesicles are grossly unremarkable. Other: Small fat containing umbilical hernia. Musculoskeletal: No acute or significant osseous findings. Review of the MIP images confirms the above findings. IMPRESSION: 1. No aortic aneurysm or dissection. No CT evidence of central pulmonary artery  embolus. 2. Minimally displaced fracture of the anterior right fourth rib and nondisplaced fracture of the sternal body. 3. Moderate cardiomegaly. 4. Bilateral lower lobe consolidative changes may represent atelectasis versus infiltrate. 5. No bowel obstruction or active inflammation. Electronically Signed   By: AAnner CreteM.D.   On: 05/04/2019 00:56     Assessment/Plan: 68year old male s/p witnessed arrest.  Patient underwent 5 defibrillations in the field with 1 mg of epinephrine.  Estimated downtime of 5 minutes.  Unable to obtain a good baseline neurological examination at this time with the patient sedated.  Initial head CT reviewed and shows no acute changes.  Recommendations: 1. Will re-evaluate if patient does not return to baseline  once patient off sedation.  No further neurologic intervention is recommended at this time.   Alexis Goodell, MD Neurology [phone] 05/04/2019, 1:20 PM

## 2019-05-04 NOTE — Progress Notes (Addendum)
ANTICOAGULATION CONSULT NOTE - Initial Consult  Pharmacy Consult for heparin drip Indication: chest pain/ACS  No Known Allergies  Patient Measurements: Weight: 160 lb (72.6 kg) Heparin Dosing Weight: 72.6 kg  Vital Signs: Temp: 96.4 F (35.8 C) (05/09 2300) Temp Source: Rectal (05/09 2200) BP: 111/82 (05/09 2330) Pulse Rate: 99 (05/09 2200)  Labs: Recent Labs    05/03/19 2207  HGB 13.4  HCT 40.6  PLT 184  CREATININE 1.68*  TROPONINI 0.03*    Estimated Creatinine Clearance: 42.4 mL/min (A) (by C-G formula based on SCr of 1.68 mg/dL (H)).   Medical History: Past Medical History:  Diagnosis Date  . Impotence of organic origin   . Pain in joint, shoulder region   . Personal history of colonic polyps     Medications:  Scheduled:  . artificial tears  1 application Both Eyes Q8H  . aspirin  300 mg Rectal NOW  . cisatracurium  0.1 mg/kg Intravenous Once  . fentaNYL (SUBLIMAZE) injection  50 mcg Intravenous Once  . pantoprazole (PROTONIX) IV  40 mg Intravenous QHS    Assessment: Patient w/ witnessed cardiac arrest, brought to ED s/p CPR, shock x 3, showing Vtach, patient's initial trop at 0.03, LA 7.6, patient w/ h/o of DM and dilated ischemic cardiomyopathy. No anticoagulation PTA documented. Patient is being started on heparin drip for possible STEMI.  Goal of Therapy:  Heparin level 0.3-0.7 units/ml Monitor platelets by anticoagulation protocol: Yes   Plan:  Will bolus w/ heparin 4000 units IV x 1  Will start rate at 1150 units/hr and will check HL w/ am labs. Baseline CBC WNL; baseline PT/INR; aPTT pending Will continue monitor daily CBC's and adjust per anti-Xa levels.  Thomasene Ripple, PharmD, BCPS Clinical Pharmacist 05/04/2019

## 2019-05-04 NOTE — Progress Notes (Signed)
Interesting day. Patient status changed from cooling to 33 degrees to 36 degrees per Dr. Sung Amabile. Patient remained in SR with multifocal PVC's. Patient has consistently remained in SB since 1600. Dr. Elease Hashimoto ,Cardiology,in to assess patient . Stated the rhythm changes were not uncommon post cardiac arrest and cooling of patient. Sedation weaned as low as possible. During accidental wake up patient opened eyes and moved bilateral arms.  Ventilator weaned to 30% FIO2.Urine output adequate. Family face timed patient twice today.

## 2019-05-04 NOTE — Progress Notes (Signed)
Assisted tele visit to patient with daughter [name].  Russell [name], Russell Riley, RN,BSN,CCRN

## 2019-05-04 NOTE — Consult Note (Addendum)
Cardiology Consultation:   Patient ID: Russell [name]. MRN: [id number]; DOB: Feb 18, 1951  Admit date: 05/03/2019 Date of Consult: 05/04/2019  Primary Care Provider: Olin Hauser, DO Primary Cardiologist: Felipa Emory  Primary Electrophysiologist:  None    Patient Profile:   Russell [name]. is a 68 y.o. male with a hx of  HTN, DM, chronic systolic chf ( EF known to be 25-30%)   who is being seen today for the evaluation of cardiac arrest  at the request of  Dr. Jerelyn Charles.  History of Present Illness:   Mr. Arnott has a hx of nonischemic CM, ( no significant CAD by cath in 2014)  EF 25%.  Yesterday, he was out playing baseball with his family. Became diaphoretic, went to bathroom and then passed out. EMS called Received CPR, 1 round of epi, 5 shocks, intubated, and had ROSC.   brought to [facility] ER,   Was started on amio Given his hx, the decision was made to not perform a cath last night.   Troponin levels are minimally elevated and are flat .   Past Medical History:  Diagnosis Date   Impotence of organic origin    Pain in joint, shoulder region    Personal history of colonic polyps     Past Surgical History:  Procedure Laterality Date   CARDIAC CATHETERIZATION  11/17/2013   COLONOSCOPY  2009     Home Medications:  Prior to Admission medications   Medication Sig Start Date End Date Taking? Authorizing Provider  Acetaminophen (TYLENOL PO) Take by mouth as needed.    [provider]  amLODipine (NORVASC) 10 MG tablet Take 1 tablet (10 mg total) by mouth daily. 06/24/18   Karamalegos, Devonne Doughty, DO  aspirin EC 81 MG tablet Take 1 tablet (81 mg total) by mouth daily. 08/22/17   Karamalegos, Devonne Doughty, DO  carvedilol (COREG) 25 MG tablet Take 1 tablet (25 mg total) by mouth 2 (two) times daily. 06/24/18   Karamalegos, Devonne Doughty, DO  cetirizine (ZYRTEC) 10 MG tablet Take 10 mg by mouth daily. 04/13/17   [provider]  Cholecalciferol (VITAMIN D3) 2000  units TABS Take by mouth.    [provider]  latanoprost (XALATAN) 0.005 % ophthalmic solution  05/30/18   [provider]  lisinopril (PRINIVIL,ZESTRIL) 40 MG tablet Take 1 tablet (40 mg total) by mouth daily. 06/24/18   Karamalegos, Devonne Doughty, DO  metFORMIN (GLUCOPHAGE-XR) 500 MG 24 hr tablet Take 1 tablet (500 mg total) by mouth daily with breakfast. 06/24/18   Parks Ranger, Devonne Doughty, DO  sildenafil (REVATIO) 20 MG tablet Take 2-3 pills about 30 min prior to sex. Start with 2 and increase as needed. 12/30/18   Karamalegos, Devonne Doughty, DO  timolol (TIMOPTIC) 0.5 % ophthalmic solution  06/18/18   [provider]    Inpatient Medications: Scheduled Meds:  carvedilol  25 mg Per NG tube BID   chlorhexidine gluconate (MEDLINE KIT)  15 mL Mouth Rinse BID   insulin aspart  0-15 Units Subcutaneous Q4H   latanoprost  1 drop Both Eyes QHS   mouth rinse  15 mL Mouth Rinse 10 times per day   pantoprazole (PROTONIX) IV  40 mg Intravenous QHS   polyvinyl alcohol  2 drop Both Eyes Q8H   timolol  1 drop Both Eyes Daily   Continuous Infusions:  sodium chloride     sodium chloride     amiodarone 30 mg/hr (05/04/19 0747)   DOPamine Stopped (05/04/19 0842)  fentaNYL infusion INTRAVENOUS 100 mcg/hr (05/04/19 0351)   heparin 1,050 Units/hr (05/04/19 0616)   lactated ringers 75 mL/hr at 05/04/19 0517   norepinephrine (LEVOPHED) Adult infusion 10 mcg/min (05/04/19 0826)   piperacillin-tazobactam (ZOSYN)  IV 3.375 g (05/04/19 0433)   propofol (DIPRIVAN) infusion 40 mcg/kg/min (05/04/19 0914)   vancomycin     PRN Meds: atropine, fentaNYL, midazolam, vecuronium  Allergies:   No Known Allergies  Social History:   Social History   Socioeconomic History   Marital status: Married    Spouse name: Not on file   Number of children: Not on file   Years of education: Not on file   Highest education level: Not on file  Occupational History   Occupation:  [employer] resource strain: Not on file   Food insecurity:    Worry: Not on file    Inability: Not on file   Transportation needs:    Medical: Not on file    Non-medical: Not on file  Tobacco Use   Smoking status: Never Smoker   Smokeless tobacco: Never Used  Substance and Sexual Activity   Alcohol use: Yes    Comment: occassional   Drug use: No   Sexual activity: Not on file  Lifestyle   Physical activity:    Days per week: Not on file    Minutes per session: Not on file   Stress: Not on file  Relationships   Social connections:    Talks on phone: Not on file    Gets together: Not on file    Attends religious service: Not on file    Active member of club or organization: Not on file    Attends meetings of clubs or organizations: Not on file    Relationship status: Not on file   Intimate partner violence:    Fear of current or ex partner: Not on file    Emotionally abused: Not on file    Physically abused: Not on file    Forced sexual activity: Not on file  Other Topics Concern   Not on file  Social History Narrative   Not on file    Family History:    Family History  Problem Relation Age of Onset   Colon cancer Father        65's   Hypertension Mother    Diabetes Mother      ROS:  Please see the history of present illness.   All other ROS reviewed and negative.     Physical Exam/Data:   Vitals:   05/04/19 0400 05/04/19 0500 05/04/19 0600 05/04/19 0805  BP: 107/85 (!) 88/65 111/75   Pulse:      Resp: _0 Temp: (!) 93.2 F (34 C) (!) 90.7 F (32.6 C) (!) 87.6 F (30.9 C)   TempSrc:      SpO2:    96%  Weight:        Intake/Output Summary (Last 24 hours) at 05/04/2019 0935 Last data filed at 05/04/2019 6294 Gross per 24 hour  Intake 996.52 ml  Output 1550 ml  Net -553.48 ml   Last 3 Weights 05/04/2019 05/03/2019 12/30/2018  Weight (lbs) 156 lb 15.5 oz 160 lb 159 lb 9.6 oz  Weight (kg) 71.2  kg 72.576 kg 72.394 kg     Body mass index is 23.87 kg/m.  General:  Middle aged black gentleman,  Intubated, sedated HEENT: normal Lymph: no adenopathy Neck: no JVD Endocrine:  No thryomegaly Vascular: No carotid bruits; FA pulses 2+ bilaterally without bruits  Cardiac:  normal S1, S2; RRR; no murmur  Lungs:   On the vent  Abd: soft, nontender, no hepatomegaly , cool  Ext: no edema Musculoskeletal:  No deformities, BUE and BLE strength normal and equal Skin: warm and dry  Neuro:  Sedated, on the vent  Psych:  Normal affect   EKG:  The EKG was personally reviewed and demonstrates:   Sinus brady .  TWI .    Telemetry:  Telemetry was personally reviewed and demonstrates:  Sinus bradycardia ,  Occasional  AIVR   Relevant CV Studies:   Laboratory Data:  Chemistry Recent Labs  Lab 05/03/19 2207 05/04/19 0215 05/04/19 0504  NA 137 137 139  K 3.7 4.5 4.2  CL 104 105 108  CO2 17* 24 24  GLUCOSE 318* 242* 177*  BUN _0 CREATININE 1.68* 1.09 0.94  CALCIUM 8.6* 9.0 8.4*  GFRNONAA 42* >60 >60  GFRAA 49* >60 >60  ANIONGAP 16* 8 7    No results for input(s): PROT, ALBUMIN, AST, ALT, ALKPHOS, BILITOT in the last 168 hours. Hematology Recent Labs  Lab 05/03/19 2207 05/04/19 0504  WBC 14.9* 13.2*  RBC 4.42 4.53  HGB 13.4 13.6  HCT 40.6 40.4  MCV 91.9 89.2  MCH 30.3 30.0  MCHC 33.0 33.7  RDW 14.5 14.4  PLT 184 175   Cardiac Enzymes Recent Labs  Lab 05/03/19 2207 05/04/19 0215 05/04/19 0504  TROPONINI 0.03* 0.69* 0.66*   No results for input(s): TROPIPOC in the last 168 hours.  BNP Recent Labs  Lab 05/04/19 0215  BNP 78.0    DDimer No results for input(s): DDIMER in the last 168 hours.  Radiology/Studies:  Dg Abd 1 View  Result Date: 05/04/2019 CLINICAL DATA:  NG tube placement EXAM: ABDOMEN - 1 VIEW COMPARISON:  Radiograph 05/03/2019 FINDINGS: Nasogastric tube extends the stomach. Side port below the GE junction. Normal bowel-gas pattern. Lung bases  clear. IMPRESSION: NG tube in proper position. Electronically Signed   By: Suzy Bouchard M.D.   On: 05/04/2019 05:33   Ct Head Wo Contrast  Result Date: 05/03/2019 CLINICAL DATA:  68 y/o M; altered level of consciousness. Witnessed cardiac arrest, down approximately 4-6 minutes, CPR. EXAM: CT HEAD WITHOUT CONTRAST TECHNIQUE: Contiguous axial images were obtained from the base of the skull through the vertex without intravenous contrast. COMPARISON:  None. FINDINGS: Brain: No evidence of acute infarction, hemorrhage, hydrocephalus, extra-axial collection or mass lesion/mass effect. Few nonspecific white matter hypodensities are compatible with mild chronic microvascular ischemic changes and there is mild volume loss of the brain. Vascular: Calcific atherosclerosis of internal carotid arteries. No hyperdense vessel identified. Skull: Normal. Negative for fracture or focal lesion. Sinuses/Orbits: No acute finding. Other: None. IMPRESSION: 1. No acute intracranial abnormality identified. 2. Mild chronic microvascular ischemic changes and volume loss of the brain. Electronically Signed   By: Kristine Garbe M.D.   On: 05/03/2019 23:38   Dg Chest Port 1 View  Result Date: 05/03/2019 CLINICAL DATA:  68 y/o  M; cardiac arrest and intubation. EXAM: PORTABLE CHEST 1 VIEW COMPARISON:  11/13/2013 chest radiograph. FINDINGS: Stable cardiac silhouette given projection and technique. Endotracheal tube tip projects 4.1 cm above the carina. Enteric tube tip extends below the field of view into the abdomen. Transcutaneous pacing pads. Diffuse pulmonary vascular congestion. No focal consolidation. No pleural effusion or pneumothorax. No acute osseous abnormality is evident. IMPRESSION: Endotracheal tube tip  projects 4.1 cm above the carina. Enteric tube tip extends below the field of view into the abdomen. Diffuse pulmonary vascular congestion. Electronically Signed   By: Kristine Garbe M.D.   On:  05/03/2019 22:47   Ct Angio Chest/abd/pel For Dissection W And/or W/wo  Result Date: 05/04/2019 CLINICAL DATA:  68 year old male with diabetes and vascular disease. Evaluate for aortic dissection or pulmonary embolism. EXAM: CT ANGIOGRAPHY CHEST, ABDOMEN AND PELVIS TECHNIQUE: Multidetector CT imaging through the chest, abdomen and pelvis was performed using the standard protocol during bolus administration of intravenous contrast. Multiplanar reconstructed images and MIPs were obtained and reviewed to evaluate the vascular anatomy. CONTRAST:  163m ISOVUE-370 IOPAMIDOL (ISOVUE-370) INJECTION 76% COMPARISON:  Chest radiograph dated 05/03/2019 FINDINGS: Evaluation is limited due to streak artifact caused by patient's arms. CTA CHEST FINDINGS Cardiovascular: There is moderate cardiomegaly. No pericardial effusion. The thoracic aorta is unremarkable. The visualized origins of the great vessels of the aortic arch appear patent. The central pulmonary arteries are patent. Evaluation of the distal pulmonary arteries is limited due to suboptimal opacification. No large or central pulmonary artery embolus identified. Mediastinum/Nodes: No hilar or mediastinal adenopathy. An enteric tube is noted within the esophagus. The thyroid gland is grossly unremarkable. No mediastinal fluid collection. Lungs/Pleura: Endotracheal tube with tip approximately 2 cm above the carina. Patchy bilateral lower lobe consolidative changes may represent atelectasis versus infiltrate. There is no pleural effusion or pneumothorax. The central airways are patent. Musculoskeletal: Minimally displaced fracture of the anterior right fourth rib. Age indeterminate, likely acute, nondisplaced and incomplete fracture of the sternal body. Review of the MIP images confirms the above findings. CTA ABDOMEN AND PELVIS FINDINGS VASCULAR Aorta: Normal caliber aorta without aneurysm, dissection, vasculitis or significant stenosis. Celiac: Patent without evidence  of aneurysm, dissection, vasculitis or significant stenosis. SMA: Patent without evidence of aneurysm, dissection, vasculitis or significant stenosis. Renals: Both renal arteries are patent without evidence of aneurysm, dissection, vasculitis, fibromuscular dysplasia or significant stenosis. IMA: Patent without evidence of aneurysm, dissection, vasculitis or significant stenosis. Inflow: Patent without evidence of aneurysm, dissection, vasculitis or significant stenosis. Veins: No obvious venous abnormality within the limitations of this arterial phase study. Air within the left common femoral vein, likely iatrogenic and related to intravenous access. Review of the MIP images confirms the above findings. NON-VASCULAR No intra-abdominal free air or free fluid. Hepatobiliary: The liver is grossly unremarkable. The gallbladder is partially contracted. No calcified gallstone. Thickened appearance of the gallbladder wall, likely partially related to underdistention. Pancreas: Unremarkable. No pancreatic ductal dilatation or surrounding inflammatory changes. Spleen: Normal in size without focal abnormality. Adrenals/Urinary Tract: Adrenal glands are unremarkable. Kidneys are normal, without renal calculi, focal lesion, or hydronephrosis. The urinary bladder is decompressed around a Foley catheter. Stomach/Bowel: An enteric tube is noted with tip in the proximal stomach. There is no bowel obstruction. The appendix is unremarkable as visualized. Lymphatic: No adenopathy. Reproductive: The prostate and seminal vesicles are grossly unremarkable. Other: Small fat containing umbilical hernia. Musculoskeletal: No acute or significant osseous findings. Review of the MIP images confirms the above findings. IMPRESSION: 1. No aortic aneurysm or dissection. No CT evidence of central pulmonary artery embolus. 2. Minimally displaced fracture of the anterior right fourth rib and nondisplaced fracture of the sternal body. 3. Moderate  cardiomegaly. 4. Bilateral lower lobe consolidative changes may represent atelectasis versus infiltrate. 5. No bowel obstruction or active inflammation. Electronically Signed   By: AAnner CreteM.D.   On: 05/04/2019 00:56    Assessment  and Plan:   1. Cardiac arrest:   Pt has a known hx of a nonischemic CM.   Had a cardiac arrest yesterday ,   Was resuscitated and now is being cooled.  Troponin levels are minimally elevated and the trend is flat.   This appears to be due to his CPR and defibrillations rather than an acute coronary syndrome.  No indication for urgent cath today .  His symptoms are c/w ischemic VT/VF.  No signs of ongoing ischemia at this point - no arrhythmias on tele after being started on amio drip  He will need a cath after he improves.   2.  VF arrest:   Remains on amio drip .  Rhythm is stable at this point   Consider DC of amio drip tomorrow  if he remains stable today and tonight   3.  HTN:    Currently getting levophed for support after his arrest.  Holding coreg, amlodipine, lisinopril for now   Stable for now    For questions or updates, please contact [place] Please consult [email or web address] for contact info under     Signed, Mertie Moores, MD  05/04/2019 9:35 AM

## 2019-05-04 NOTE — Progress Notes (Signed)
ANTICOAGULATION CONSULT NOTE - Initial Consult  Pharmacy Consult for heparin drip Indication: chest pain/ACS  No Known Allergies  Patient Measurements: Weight: 156 lb 15.5 oz (71.2 kg) Heparin Dosing Weight: 72.6 kg  Vital Signs: Temp: 96.8 F (36 C) (05/10 0200) Temp Source: Bladder (05/10 0200) BP: 142/102 (05/10 0200) Pulse Rate: 99 (05/09 2200)  Labs: Recent Labs    05/03/19 2207 05/04/19 0215 05/04/19 0504  HGB 13.4  --  13.6  HCT 40.6  --  40.4  PLT 184  --  175  APTT  --  84*  --   LABPROT  --  13.6  --   INR  --  1.1  --   HEPARINUNFRC  --   --  0.84*  CREATININE 1.68* 1.09  --   TROPONINI 0.03* 0.69*  --     Estimated Creatinine Clearance: 65.4 mL/min (by C-G formula based on SCr of 1.09 mg/dL).   Medical History: Past Medical History:  Diagnosis Date  . Impotence of organic origin   . Pain in joint, shoulder region   . Personal history of colonic polyps     Medications:  Scheduled:  . carvedilol  25 mg Per NG tube BID  . chlorhexidine gluconate (MEDLINE KIT)  15 mL Mouth Rinse BID  . insulin aspart  0-9 Units Subcutaneous Q4H  . latanoprost  1 drop Both Eyes QHS  . mouth rinse  15 mL Mouth Rinse 10 times per day  . pantoprazole (PROTONIX) IV  40 mg Intravenous QHS  . polyvinyl alcohol  2 drop Both Eyes Q8H  . timolol  1 drop Both Eyes Daily    Assessment: Patient w/ witnessed cardiac arrest, brought to ED s/p CPR, shock x 3, showing Vtach, patient's initial trop at 0.03, LA 7.6, patient w/ h/o of DM and dilated ischemic cardiomyopathy. No anticoagulation PTA documented. Patient is being started on heparin drip for possible STEMI.  Goal of Therapy:  Heparin level 0.3-0.7 units/ml Monitor platelets by anticoagulation protocol: Yes   Plan:  05/10 @ 0500 HL 0.84 supratherapeutic. Will reduce rate to 1050 units/hr and will recheck HL @ 1200, CBC stable; however, trops are rising 0.03 >> 0.69, will continue to monitor.  Shawnn Besanti, PharmD,  BCPS Clinical Pharmacist 05/04/2019  

## 2019-05-04 NOTE — Progress Notes (Signed)
[employer] [facility] - [place] at [facility]   PATIENT NAME: Russell [name]    MR#:  [id number]  DATE OF BIRTH:  04/27/1951  SUBJECTIVE:  CHIEF COMPLAINT:   Chief Complaint  Patient presents with  . Cardiac Arrest    REVIEW OF SYSTEMS:  CONSTITUTIONAL: No fever, fatigue or weakness.  EYES: No blurred or double vision.  EARS, NOSE, AND THROAT: No tinnitus or ear pain.  RESPIRATORY: No cough, shortness of breath, wheezing or hemoptysis.  CARDIOVASCULAR: No chest pain, orthopnea, edema.  GASTROINTESTINAL: No nausea, vomiting, diarrhea or abdominal pain.  GENITOURINARY: No dysuria, hematuria.  ENDOCRINE: No polyuria, nocturia,  HEMATOLOGY: No anemia, easy bruising or bleeding SKIN: No rash or lesion. MUSCULOSKELETAL: No joint pain or arthritis.   NEUROLOGIC: No tingling, numbness, weakness.  PSYCHIATRY: No anxiety or depression.   ROS  DRUG ALLERGIES:  No Known Allergies  VITALS:  Blood pressure 109/78, pulse 99, temperature (!) 95.5 F (35.3 C), resp. rate 18, weight 71.2 kg, SpO2 96 %.  PHYSICAL EXAMINATION:  GENERAL:  68 y.o.-year-old patient lying in the bed with no acute distress.  EYES: Pupils equal, round, reactive to light and accommodation. No scleral icterus. Extraocular muscles intact.  HEENT: Head atraumatic, normocephalic. Oropharynx and nasopharynx clear.  NECK:  Supple, no jugular venous distention. No thyroid enlargement, no tenderness.  LUNGS: Normal breath sounds bilaterally, no wheezing, rales,rhonchi or crepitation. No use of accessory muscles of respiration.  CARDIOVASCULAR: S1, S2 normal. No murmurs, rubs, or gallops.  ABDOMEN: Soft, nontender, nondistended. Bowel sounds present. No organomegaly or mass.  EXTREMITIES: No pedal edema, cyanosis, or clubbing.  NEUROLOGIC: Cranial nerves II through XII are intact. Muscle strength 5/5 in all extremities. Sensation intact. Gait not checked.  PSYCHIATRIC: The patient is alert and oriented x 3.  SKIN: No  obvious rash, lesion, or ulcer.   Physical Exam LABORATORY PANEL:   CBC Recent Labs  Lab 05/04/19 0504  WBC 13.2*  HGB 13.6  HCT 40.4  PLT 175   ------------------------------------------------------------------------------------------------------------------  Chemistries  Recent Labs  Lab 05/04/19 0504  NA 139  K 4.2  CL 108  CO2 24  GLUCOSE 177*  BUN 18  CREATININE 0.94  CALCIUM 8.4*  MG 2.2   ------------------------------------------------------------------------------------------------------------------  Cardiac Enzymes Recent Labs  Lab 05/04/19 0215 05/04/19 0504  TROPONINI 0.69* 0.66*   ------------------------------------------------------------------------------------------------------------------  RADIOLOGY:  Dg Abd 1 View  Result Date: 05/04/2019 CLINICAL DATA:  NG tube placement EXAM: ABDOMEN - 1 VIEW COMPARISON:  Radiograph 05/03/2019 FINDINGS: Nasogastric tube extends the stomach. Side port below the GE junction. Normal bowel-gas pattern. Lung bases clear. IMPRESSION: NG tube in proper position. Electronically Signed   By: Genevive Bi M.D.   On: 05/04/2019 05:33   Ct Head Wo Contrast  Result Date: 05/03/2019 CLINICAL DATA:  68 y/o M; altered level of consciousness. Witnessed cardiac arrest, down approximately 4-6 minutes, CPR. EXAM: CT HEAD WITHOUT CONTRAST TECHNIQUE: Contiguous axial images were obtained from the base of the skull through the vertex without intravenous contrast. COMPARISON:  None. FINDINGS: Brain: No evidence of acute infarction, hemorrhage, hydrocephalus, extra-axial collection or mass lesion/mass effect. Few nonspecific white matter hypodensities are compatible with mild chronic microvascular ischemic changes and there is mild volume loss of the brain. Vascular: Calcific atherosclerosis of internal carotid arteries. No hyperdense vessel identified. Skull: Normal. Negative for fracture or focal lesion. Sinuses/Orbits: No acute  finding. Other: None. IMPRESSION: 1. No acute intracranial abnormality identified. 2. Mild chronic microvascular ischemic changes and volume loss  of the brain. Electronically Signed   By: Mitzi Hansen M.D.   On: 05/03/2019 23:38   Dg Chest Port 1 View  Result Date: 05/03/2019 CLINICAL DATA:  68 y/o  M; cardiac arrest and intubation. EXAM: PORTABLE CHEST 1 VIEW COMPARISON:  11/13/2013 chest radiograph. FINDINGS: Stable cardiac silhouette given projection and technique. Endotracheal tube tip projects 4.1 cm above the carina. Enteric tube tip extends below the field of view into the abdomen. Transcutaneous pacing pads. Diffuse pulmonary vascular congestion. No focal consolidation. No pleural effusion or pneumothorax. No acute osseous abnormality is evident. IMPRESSION: Endotracheal tube tip projects 4.1 cm above the carina. Enteric tube tip extends below the field of view into the abdomen. Diffuse pulmonary vascular congestion. Electronically Signed   By: Mitzi Hansen M.D.   On: 05/03/2019 22:47   Ct Angio Chest/abd/pel For Dissection W And/or W/wo  Result Date: 05/04/2019 CLINICAL DATA:  68 year old male with diabetes and vascular disease. Evaluate for aortic dissection or pulmonary embolism. EXAM: CT ANGIOGRAPHY CHEST, ABDOMEN AND PELVIS TECHNIQUE: Multidetector CT imaging through the chest, abdomen and pelvis was performed using the standard protocol during bolus administration of intravenous contrast. Multiplanar reconstructed images and MIPs were obtained and reviewed to evaluate the vascular anatomy. CONTRAST:  ISOVUE-370 IOPAMIDOL (ISOVUE-370) INJECTION 76% COMPARISON:  Chest radiograph dated 05/03/2019 FINDINGS: Evaluation is limited due to streak artifact caused by patient's arms. CTA CHEST FINDINGS Cardiovascular: There is moderate cardiomegaly. No pericardial effusion. The thoracic aorta is unremarkable. The visualized origins of the great vessels of the aortic arch  appear patent. The central pulmonary arteries are patent. Evaluation of the distal pulmonary arteries is limited due to suboptimal opacification. No large or central pulmonary artery embolus identified. Mediastinum/Nodes: No hilar or mediastinal adenopathy. An enteric tube is noted within the esophagus. The thyroid gland is grossly unremarkable. No mediastinal fluid collection. Lungs/Pleura: Endotracheal tube with tip approximately 2 cm above the carina. Patchy bilateral lower lobe consolidative changes may represent atelectasis versus infiltrate. There is no pleural effusion or pneumothorax. The central airways are patent. Musculoskeletal: Minimally displaced fracture of the anterior right fourth rib. Age indeterminate, likely acute, nondisplaced and incomplete fracture of the sternal body. Review of the MIP images confirms the above findings. CTA ABDOMEN AND PELVIS FINDINGS VASCULAR Aorta: Normal caliber aorta without aneurysm, dissection, vasculitis or significant stenosis. Celiac: Patent without evidence of aneurysm, dissection, vasculitis or significant stenosis. SMA: Patent without evidence of aneurysm, dissection, vasculitis or significant stenosis. Renals: Both renal arteries are patent without evidence of aneurysm, dissection, vasculitis, fibromuscular dysplasia or significant stenosis. IMA: Patent without evidence of aneurysm, dissection, vasculitis or significant stenosis. Inflow: Patent without evidence of aneurysm, dissection, vasculitis or significant stenosis. Veins: No obvious venous abnormality within the limitations of this arterial phase study. Air within the left common femoral vein, likely iatrogenic and related to intravenous access. Review of the MIP images confirms the above findings. NON-VASCULAR No intra-abdominal free air or free fluid. Hepatobiliary: The liver is grossly unremarkable. The gallbladder is partially contracted. No calcified gallstone. Thickened appearance of the gallbladder  wall, likely partially related to underdistention. Pancreas: Unremarkable. No pancreatic ductal dilatation or surrounding inflammatory changes. Spleen: Normal in size without focal abnormality. Adrenals/Urinary Tract: Adrenal glands are unremarkable. Kidneys are normal, without renal calculi, focal lesion, or hydronephrosis. The urinary bladder is decompressed around a Foley catheter. Stomach/Bowel: An enteric tube is noted with tip in the proximal stomach. There is no bowel obstruction. The appendix is unremarkable as visualized. Lymphatic: No adenopathy.  Reproductive: The prostate and seminal vesicles are grossly unremarkable. Other: Small fat containing umbilical hernia. Musculoskeletal: No acute or significant osseous findings. Review of the MIP images confirms the above findings. IMPRESSION: 1. No aortic aneurysm or dissection. No CT evidence of central pulmonary artery embolus. 2. Minimally displaced fracture of the anterior right fourth rib and nondisplaced fracture of the sternal body. 3. Moderate cardiomegaly. 4. Bilateral lower lobe consolidative changes may represent atelectasis versus infiltrate. 5. No bowel obstruction or active inflammation. Electronically Signed   By: Elgie Collard M.D.   On: 05/04/2019 00:56    ASSESSMENT AND PLAN:  * Acute Cardiac arrest Likely due to VT/VF, ROSC after 5 shocks Continue hypothermia protocol, mechanical ventilation with weaning as tolerated, amiodarone drip, cardiology input appreciated-no acute intervention at this time, will need heart catheterization once improves, follow-up on echocardiogram  *Chronic combined systolic/diastolic CHF, w/ DCM Compensated Strict I&O monitoring, daily weights, follow-up on echocardiogram, cardiology input appreciated  *Acute lactic acidosis secondary to his cardiac arrest IV fluids for rehydration  *Chronic diabetes mellitus type 2 with hyperglycemia  Controlled on current regiment  Continue to hold metformin,  continue sliding scale insulin with Accu-Cheks per routine   *Chronic benign essential hypertension  Stable  Antihypertensives on hold for hypotension  DVT prophylaxis-currently on heparin drip GI prophylaxis-PPI Disposition pending clinical course   All the records are reviewed and case discussed with Care Management/Social Workerr. Management plans discussed with the patient, family and they are in agreement.  CODE STATUS: full  TOTAL TIME TAKING CARE OF THIS PATIENT: 35 minutes.     POSSIBLE D/C IN 2-5 DAYS, DEPENDING ON CLINICAL CONDITION.   Evelena Asa  M.D on 05/04/2019   Between 7am to 6pm - Pager - [phone]  After 6pm go to [email or web address] - password [email or web address]  [facility]  Office  [phone]  CC: Primary care physician; Smitty Cords, DO  Note: This dictation was prepared with Dragon dictation along with smaller phrase technology. Any transcriptional errors that result from this process are unintentional.

## 2019-05-04 NOTE — Progress Notes (Signed)
ANTICOAGULATION CONSULT NOTE   Pharmacy Consult for heparin drip Indication: chest pain/ACS  No Known Allergies  Patient Measurements: Height: 5' 8"  (172.7 cm) Weight: 156 lb 15.5 oz (71.2 kg) IBW/kg (Calculated) : 68.4 Heparin Dosing Weight: 72.6 kg  Vital Signs: Temp: 96.3 F (35.7 C) (05/10 2100) Temp Source: Bladder (05/10 1600) BP: 101/68 (05/10 2100)  Labs: Recent Labs    05/03/19 2207 05/04/19 0215 05/04/19 0504 05/04/19 0808 05/04/19 1152 05/04/19 1309 05/04/19 1738 05/04/19 2103  HGB 13.4  --  13.6  --   --   --   --   --   HCT 40.6  --  40.4  --   --   --   --   --   PLT 184  --  175  --   --   --   --   --   APTT  --  84*  --  144*  --   --   --   --   LABPROT  --  13.6  --  14.0  --   --   --   --   INR  --  1.1  --  1.1  --   --   --   --   HEPARINUNFRC  --   --  0.84*  --   --  1.00*  --  0.51  CREATININE 1.68* 1.09 0.94  --   --   --   --   --   TROPONINI 0.03* 0.69* 0.66*  --  0.68*  --  0.50*  --     Estimated Creatinine Clearance: 75.8 mL/min (by C-G formula based on SCr of 0.94 mg/dL).   Medical History: Past Medical History:  Diagnosis Date  . Impotence of organic origin   . Pain in joint, shoulder region   . Personal history of colonic polyps     Medications:  Scheduled:  . chlorhexidine gluconate (MEDLINE KIT)  15 mL Mouth Rinse BID  . insulin aspart  0-15 Units Subcutaneous Q4H  . latanoprost  1 drop Both Eyes QHS  . mouth rinse  15 mL Mouth Rinse 10 times per day  . pantoprazole (PROTONIX) IV  40 mg Intravenous QHS  . polyvinyl alcohol  2 drop Both Eyes Q8H  . timolol  1 drop Both Eyes Daily    Assessment: Patient w/ witnessed cardiac arrest, brought to ED s/p CPR, shock x 3, showing Vtach, patient's initial trop at 0.03, LA 7.6, patient w/ h/o of DM and dilated ischemic cardiomyopathy. No anticoagulation PTA documented. Patient is being started on heparin drip for possible STEMI.  05/10 @ 0500 HL 0.84 supratherapeutic. Will  reduce rate to 1050 units/hr and will recheck HL @ 1200, CBC stable; however, trops are rising 0.03 >> 0.69, will continue to monitor.  Goal of Therapy:  Heparin level 0.3-0.7 units/ml Monitor platelets by anticoagulation protocol: Yes   Plan:  05/10 @ 2100 HL 0.51 therapeutic. Will continue current rate and will recheck @ 0300 CBC stable will continue to monitor.  Tobie Lords, PharmD, BCPS Clinical Pharmacist 05/04/2019

## 2019-05-04 NOTE — Procedures (Addendum)
Central Venous Catheter Insertion Procedure Note Russell [name] [id number] [birth year]  Procedure: Insertion of Central Venous Catheter Indications: Assessment of intravascular volume, Drug and/or fluid administration and Frequent blood sampling  Procedure Details Consent: Unable to obtain consent because of emergent medical necessity. Time Out: Verified patient identification, verified procedure, site/side was marked, verified correct patient position, special equipment/implants available, medications/allergies/relevent history reviewed, required imaging and test results available.  Performed  Maximum sterile technique was used including antiseptics, cap, gloves, gown, hand hygiene, mask and sheet. Skin prep: Chlorhexidine; local anesthetic administered A antimicrobial bonded/coated triple lumen catheter was placed in the right femoral vein due to emergent situation using the Seldinger technique.  Evaluation Blood flow good Complications: No apparent complications Patient did tolerate procedure well. Chest X-ray ordered to verify placement.  CXR: not indicated.  Procedure performed under direct supervision of Dr.Simonds. Ultrasound utilized for realtime vessel cannulation  Russell [name] 05/04/2019, 8:30 AM

## 2019-05-05 ENCOUNTER — Inpatient Hospital Stay: Payer: BLUE CROSS/BLUE SHIELD

## 2019-05-05 ENCOUNTER — Telehealth: Payer: Self-pay | Admitting: Cardiovascular Disease

## 2019-05-05 DIAGNOSIS — G9340 Encephalopathy, unspecified: Secondary | ICD-10-CM

## 2019-05-05 DIAGNOSIS — J9600 Acute respiratory failure, unspecified whether with hypoxia or hypercapnia: Secondary | ICD-10-CM

## 2019-05-05 LAB — CBC WITH DIFFERENTIAL/PLATELET
Abs Immature Granulocytes: 0.05 10*3/uL (ref 0.00–0.07)
Basophils Absolute: 0 10*3/uL (ref 0.0–0.1)
Basophils Relative: 0 %
Eosinophils Absolute: 0.1 10*3/uL (ref 0.0–0.5)
Eosinophils Relative: 1 %
HCT: 38.5 % — ABNORMAL LOW (ref 39.0–52.0)
Hemoglobin: 12.9 g/dL — ABNORMAL LOW (ref 13.0–17.0)
Immature Granulocytes: 0 %
Lymphocytes Relative: 6 %
Lymphs Abs: 0.9 10*3/uL (ref 0.7–4.0)
MCH: 29.6 pg (ref 26.0–34.0)
MCHC: 33.5 g/dL (ref 30.0–36.0)
MCV: 88.3 fL (ref 80.0–100.0)
Monocytes Absolute: 1 10*3/uL (ref 0.1–1.0)
Monocytes Relative: 7 %
Neutro Abs: 12.3 10*3/uL — ABNORMAL HIGH (ref 1.7–7.7)
Neutrophils Relative %: 86 %
Platelets: 169 10*3/uL (ref 150–400)
RBC: 4.36 MIL/uL (ref 4.22–5.81)
RDW: 14.2 % (ref 11.5–15.5)
WBC: 14.3 10*3/uL — ABNORMAL HIGH (ref 4.0–10.5)
nRBC: 0 % (ref 0.0–0.2)

## 2019-05-05 LAB — BASIC METABOLIC PANEL
Anion gap: 10 (ref 5–15)
BUN: 14 mg/dL (ref 8–23)
CO2: 23 mmol/L (ref 22–32)
Calcium: 7.9 mg/dL — ABNORMAL LOW (ref 8.9–10.3)
Chloride: 110 mmol/L (ref 98–111)
Creatinine, Ser: 0.94 mg/dL (ref 0.61–1.24)
GFR calc Af Amer: >60 mL/min (ref >60)
GFR calc non Af Amer: >60 mL/min (ref >60)
Glucose, Bld: 135 mg/dL — ABNORMAL HIGH (ref 70–99)
Potassium: 3.1 mmol/L — ABNORMAL LOW (ref 3.5–5.1)
Sodium: 143 mmol/L (ref 135–145)

## 2019-05-05 LAB — BLOOD GAS, ARTERIAL
Acid-base deficit: 3.7 mmol/L — ABNORMAL HIGH (ref 0.0–2.0)
Bicarbonate: 22.1 mmol/L (ref 20.0–28.0)
FIO2: 0.3
MECHVT: 500 mL
O2 Saturation: 95.6 %
PEEP: 5 cmH2O
Patient temperature: 37
RATE: 18 resp/min
pCO2 arterial: 42 mmHg (ref 32.0–48.0)
pH, Arterial: 7.33 — ABNORMAL LOW (ref 7.350–7.450)
pO2, Arterial: 85 mmHg (ref 83.0–108.0)

## 2019-05-05 LAB — GLUCOSE, CAPILLARY
Glucose-Capillary: 108 mg/dL — ABNORMAL HIGH (ref 70–99)
Glucose-Capillary: 128 mg/dL — ABNORMAL HIGH (ref 70–99)
Glucose-Capillary: 135 mg/dL — ABNORMAL HIGH (ref 70–99)
Glucose-Capillary: 162 mg/dL — ABNORMAL HIGH (ref 70–99)
Glucose-Capillary: 163 mg/dL — ABNORMAL HIGH (ref 70–99)
Glucose-Capillary: 505 mg/dL (ref 70–99)
Glucose-Capillary: 89 mg/dL (ref 70–99)
Glucose-Capillary: 90 mg/dL (ref 70–99)

## 2019-05-05 LAB — MAGNESIUM: Magnesium: 2 mg/dL (ref 1.7–2.4)

## 2019-05-05 LAB — PROCALCITONIN: Procalcitonin: 1.66 ng/mL

## 2019-05-05 LAB — PHOSPHORUS: Phosphorus: 2.7 mg/dL (ref 2.5–4.6)

## 2019-05-05 LAB — TRIGLYCERIDES: Triglycerides: 75 mg/dL (ref <150)

## 2019-05-05 LAB — HEPARIN LEVEL (UNFRACTIONATED): Heparin Unfractionated: 0.62 IU/mL (ref 0.30–0.70)

## 2019-05-05 MED ORDER — CARVEDILOL 12.5 MG PO TABS
12.5000 mg | ORAL_TABLET | Freq: Two times a day (BID) | ORAL | Status: DC
  Administered 2019-05-05 – 2019-05-07 (×4): 12.5 mg via ORAL
  Filled 2019-05-05 (×4): qty 1

## 2019-05-05 MED ORDER — INSULIN ASPART 100 UNIT/ML ~~LOC~~ SOLN
0.0000 [IU] | SUBCUTANEOUS | Status: DC
  Administered 2019-05-05 – 2019-05-06 (×3): 2 [IU] via SUBCUTANEOUS
  Filled 2019-05-05 (×2): qty 1

## 2019-05-05 MED ORDER — POTASSIUM CHLORIDE 20 MEQ/15ML (10%) PO SOLN
40.0000 meq | Freq: Once | ORAL | Status: AC
  Administered 2019-05-05: 40 meq
  Filled 2019-05-05: qty 30

## 2019-05-05 MED ORDER — DEXMEDETOMIDINE HCL IN NACL 400 MCG/100ML IV SOLN
0.0000 ug/kg/h | INTRAVENOUS | Status: DC
  Administered 2019-05-05 (×3): 1 ug/kg/h via INTRAVENOUS
  Administered 2019-05-06: 0.5 ug/kg/h via INTRAVENOUS
  Filled 2019-05-05 (×3): qty 100

## 2019-05-05 MED ORDER — LOSARTAN POTASSIUM 25 MG PO TABS
50.0000 mg | ORAL_TABLET | Freq: Every day | ORAL | Status: DC
  Administered 2019-05-06 – 2019-05-07 (×2): 50 mg via ORAL
  Filled 2019-05-05: qty 1
  Filled 2019-05-05: qty 2

## 2019-05-05 MED ORDER — AMLODIPINE BESYLATE 10 MG PO TABS
10.0000 mg | ORAL_TABLET | Freq: Every day | ORAL | Status: DC
  Administered 2019-05-05: 10 mg
  Filled 2019-05-05: qty 1

## 2019-05-05 MED ORDER — LISINOPRIL 20 MG PO TABS
20.0000 mg | ORAL_TABLET | Freq: Every day | ORAL | Status: DC
  Administered 2019-05-05: 20 mg
  Filled 2019-05-05: qty 1

## 2019-05-05 MED ORDER — HYDRALAZINE HCL 20 MG/ML IJ SOLN
10.0000 mg | INTRAMUSCULAR | Status: DC | PRN
  Administered 2019-05-05 (×5): 10 mg via INTRAVENOUS
  Filled 2019-05-05 (×3): qty 1

## 2019-05-05 MED ORDER — AMLODIPINE BESYLATE 10 MG PO TABS
10.0000 mg | ORAL_TABLET | Freq: Every day | ORAL | Status: DC
  Administered 2019-05-06 – 2019-05-07 (×2): 10 mg via ORAL
  Filled 2019-05-05 (×2): qty 1

## 2019-05-05 MED ORDER — PROPOFOL 1000 MG/100ML IV EMUL
5.0000 ug/kg/min | INTRAVENOUS | Status: DC
  Administered 2019-05-05: 30 ug/kg/min via INTRAVENOUS
  Filled 2019-05-05: qty 100

## 2019-05-05 MED ORDER — CARVEDILOL 12.5 MG PO TABS
12.5000 mg | ORAL_TABLET | Freq: Two times a day (BID) | ORAL | Status: DC
  Administered 2019-05-05: 12.5 mg
  Filled 2019-05-05: qty 1

## 2019-05-05 MED ORDER — POTASSIUM CHLORIDE 2 MEQ/ML IV SOLN
INTRAVENOUS | Status: DC
  Administered 2019-05-05: 18:00:00 via INTRAVENOUS
  Filled 2019-05-05 (×2): qty 1000

## 2019-05-05 MED ORDER — NOREPINEPHRINE BITARTRATE 1 MG/ML IV SOLN
0.0000 ug/min | INTRAVENOUS | Status: DC
  Filled 2019-05-05: qty 16

## 2019-05-05 MED ORDER — ASPIRIN 81 MG PO CHEW
81.0000 mg | CHEWABLE_TABLET | Freq: Every day | ORAL | Status: DC
  Administered 2019-05-06: 81 mg via ORAL
  Filled 2019-05-05 (×2): qty 1

## 2019-05-05 NOTE — Progress Notes (Addendum)
1030 Talked with family via ELink. Recognized family members and spoke briefly with them. Patient had problems keeping his attention on the visit. Dr. Sung Amabile previously ordered Precedex for patients restlessness. Started after video chat.  Given  Hydralazine IVP x 2 - 10 mg  per prn order for blood pressure greater than 160. Patient calming down slowly with Precedex on board.

## 2019-05-05 NOTE — Progress Notes (Signed)
Good day. Extubated early this morning to 2 liters nasal cannula. Able to speak to family at 10 am but unable to focus for any length of time. Dr. Sung Amabile in and ordered Precedex to help with agitation. Asking about what happened every 5 minutes all day.He has no memory of Saturday at all. MAE without issues.  Coordination has improved through out the day.

## 2019-05-05 NOTE — Progress Notes (Signed)
Patient ID: Russell Love., male   DOB: [birth year], 68 y.o.   MRN: [id number]  [facility] PROGRESS NOTE  Russell Love. GQB:[id number] DOB: 1951/04/01 DOA: 05/03/2019 PCP: Olin Hauser, DO  HPI/Subjective: Patient extubated and asking what happened.  He is still on Precedex drip.  He is moving all extremities.  Answering questions appropriately.  Objective: Vitals:   05/05/19 1400 05/05/19 1430  BP:    Pulse: 77 81  Resp: 10 14  Temp: 97.9 F (36.6 C)   SpO2: 95% 97%    Filed Weights   05/03/19 2201 05/04/19 0200  Weight: 72.6 kg 71.2 kg    ROS: Review of Systems  Constitutional: Negative for chills and fever.  Eyes: Negative for blurred vision.  Respiratory: Negative for cough and shortness of breath.   Cardiovascular: Negative for chest pain.  Gastrointestinal: Negative for abdominal pain, constipation, diarrhea, nausea and vomiting.  Genitourinary: Negative for dysuria.  Musculoskeletal: Negative for joint pain.  Neurological: Negative for dizziness and headaches.   Exam: Physical Exam  Constitutional: He is oriented to person, place, and time.  HENT:  Nose: No mucosal edema.  Mouth/Throat: No oropharyngeal exudate or posterior oropharyngeal edema.  Eyes: Pupils are equal, round, and reactive to light. Conjunctivae, EOM and lids are normal.  Neck: No JVD present. Carotid bruit is not present. No edema present. No thyroid mass and no thyromegaly present.  Cardiovascular: S1 normal and S2 normal. Exam reveals no gallop.  No murmur heard. Pulses:      Dorsalis pedis pulses are 2+ on the right side and 2+ on the left side.  Respiratory: No respiratory distress. He has no wheezes. He has no rhonchi. He has no rales.  GI: Soft. Bowel sounds are normal. There is no abdominal tenderness.  Musculoskeletal:     Right shoulder: He exhibits no swelling.  Lymphadenopathy:    He has no cervical adenopathy.  Neurological: He is alert and oriented to person,  place, and time. No cranial nerve deficit.  Power 5 out of 5 upper and lower extremities  Skin: Skin is warm. No rash noted. Nails show no clubbing.  Psychiatric: He has a normal mood and affect.      Data Reviewed: Basic Metabolic Panel: Recent Labs  Lab 05/03/19 2207 05/04/19 0215 05/04/19 0504 05/05/19 0443  NA 137 137 139 143  K 3.7 4.5 4.2 3.1*  CL 104 105 108 110  CO2 17* 24 24 23   GLUCOSE 318* 242* 177* 135*  BUN 17 19 18 14   CREATININE 1.68* 1.09 0.94 0.94  CALCIUM 8.6* 9.0 8.4* 7.9*  MG  --   --  2.2 2.0  PHOS  --   --  3.7 2.7   CBC: Recent Labs  Lab 05/03/19 2207 05/04/19 0504 05/05/19 0443  WBC 14.9* 13.2* 14.3*  NEUTROABS  --   --  12.3*  HGB 13.4 13.6 12.9*  HCT 40.6 40.4 38.5*  MCV 91.9 89.2 88.3  PLT 184 175 169   Cardiac Enzymes: Recent Labs  Lab 05/03/19 2207 05/04/19 0215 05/04/19 0504 05/04/19 1152 05/04/19 1738  TROPONINI 0.03* 0.69* 0.66* 0.68* 0.50*   BNP (last 3 results) Recent Labs    05/04/19 0215  BNP 78.0     CBG: Recent Labs  Lab 05/05/19 0054 05/05/19 0233 05/05/19 0313 05/05/19 0714 05/05/19 1517  GLUCAP 90 108* 128* 89 162*    Recent Results (from the past 240 hour(s))  SARS Coronavirus 2 (CEPHEID - Performed in [place]  hospital lab), Hosp Order     Status: None   Collection Time: 05/03/19 10:07 PM  Result Value Ref Range Status   SARS Coronavirus 2 NEGATIVE NEGATIVE Final    Comment: (NOTE) If result is NEGATIVE SARS-CoV-2 target nucleic acids are NOT DETECTED. The SARS-CoV-2 RNA is generally detectable in upper and lower  respiratory specimens during the acute phase of infection. The lowest  concentration of SARS-CoV-2 viral copies this assay can detect is 250  copies / mL. A negative result does not preclude SARS-CoV-2 infection  and should not be used as the sole basis for treatment or other  patient management decisions.  A negative result may occur with  improper specimen collection /  handling, submission of specimen other  than nasopharyngeal swab, presence of viral mutation(s) within the  areas targeted by this assay, and inadequate number of viral copies  (<250 copies / mL). A negative result must be combined with clinical  observations, patient history, and epidemiological information. If result is POSITIVE SARS-CoV-2 target nucleic acids are DETECTED. The SARS-CoV-2 RNA is generally detectable in upper and lower  respiratory specimens dur ing the acute phase of infection.  Positive  results are indicative of active infection with SARS-CoV-2.  Clinical  correlation with patient history and other diagnostic information is  necessary to determine patient infection status.  Positive results do  not rule out bacterial infection or co-infection with other viruses. If result is PRESUMPTIVE POSTIVE SARS-CoV-2 nucleic acids MAY BE PRESENT.   A presumptive positive result was obtained on the submitted specimen  and confirmed on repeat testing.  While 2019 novel coronavirus  (SARS-CoV-2) nucleic acids may be present in the submitted sample  additional confirmatory testing may be necessary for epidemiological  and / or clinical management purposes  to differentiate between  SARS-CoV-2 and other Sarbecovirus currently known to infect humans.  If clinically indicated additional testing with an alternate test  methodology [phone]) is advised. The SARS-CoV-2 RNA is generally  detectable in upper and lower respiratory sp ecimens during the acute  phase of infection. The expected result is Negative. Fact Sheet for Patients:  StrictlyIdeas.no Fact Sheet for Healthcare Providers: BankingDealers.co.za This test is not yet approved or cleared by the [place] FDA and has been authorized for detection and/or diagnosis of SARS-CoV-2 by FDA under an Emergency Use Authorization (EUA).  This EUA will remain in effect (meaning this  test can be used) for the duration of the COVID-19 declaration under Section 564(b)(1) of the Act, 21 U.S.C. section 360bbb-3(b)(1), unless the authorization is terminated or revoked sooner. Performed at [facility], [address] [place], [place] [address]   CULTURE, BLOOD (ROUTINE X 2) w Reflex to ID Panel     Status: None (Preliminary result)   Collection Time: 05/04/19  2:15 AM  Result Value Ref Range Status   Specimen Description BLOOD LEFT HAND  Final   Special Requests   Final    BOTTLES DRAWN AEROBIC AND ANAEROBIC Blood Culture results may not be optimal due to an inadequate volume of blood received in culture bottles   Culture   Final    NO GROWTH 1 DAY Performed at [facility], [address], [place], [place] [address]    Report Status PENDING  Incomplete  MRSA PCR Screening     Status: Abnormal   Collection Time: 05/04/19  2:16 AM  Result Value Ref Range Status   MRSA by PCR POSITIVE (A) NEGATIVE Final  Comment:        The GeneXpert MRSA Assay (FDA approved for NASAL specimens only), is one component of a comprehensive MRSA colonization surveillance program. It is not intended to diagnose MRSA infection nor to guide or monitor treatment for MRSA infections. RESULT CALLED TO, READ BACK BY AND VERIFIED WITH: FELICIA PREUHOMME AT 9629 ON 05/04/2019 JJB Performed at [facility], [address] [place], [place] [address]   CULTURE, BLOOD (ROUTINE X 2) w Reflex to ID Panel     Status: None (Preliminary result)   Collection Time: 05/04/19  2:29 AM  Result Value Ref Range Status   Specimen Description BLOOD LEFT FOREARM  Final   Special Requests   Final    BOTTLES DRAWN AEROBIC AND ANAEROBIC Blood Culture results may not be optimal due to an inadequate volume of blood received in culture bottles   Culture   Final    NO GROWTH 1 DAY Performed at [facility], [address] [place], [place] [address]    Report Status  PENDING  Incomplete  Culture, respiratory (non-expectorated)     Status: None (Preliminary result)   Collection Time: 05/04/19  8:04 AM  Result Value Ref Range Status   Specimen Description   Final    TRACHEAL ASPIRATE Performed at [facility], [address] [place], [place] [address]    Special Requests   Final    NONE Performed at [facility], [address] [place], [place] [address]    Gram Stain   Final    RARE WBC PRESENT, PREDOMINANTLY PMN FEW GRAM POSITIVE COCCI IN CHAINS RARE GRAM NEGATIVE RODS    Culture   Final    FEW Consistent with normal respiratory flora. Performed at [facility], [place] [address] [place], [place] [address]    Report Status PENDING  Incomplete     Studies: Dg Abd 1 View  Result Date: 05/04/2019 CLINICAL DATA:  NG tube placement EXAM: ABDOMEN - 1 VIEW COMPARISON:  Radiograph 05/03/2019 FINDINGS: Nasogastric tube extends the stomach. Side port below the GE junction. Normal bowel-gas pattern. Lung bases clear. IMPRESSION: NG tube in proper position. Electronically Signed   By: Suzy Bouchard M.D.   On: 05/04/2019 05:33   Ct Head Wo Contrast  Result Date: 05/03/2019 CLINICAL DATA:  68 y/o M; altered level of consciousness. Witnessed cardiac arrest, down approximately 4-6 minutes, CPR. EXAM: CT HEAD WITHOUT CONTRAST TECHNIQUE: Contiguous axial images were obtained from the base of the skull through the vertex without intravenous contrast. COMPARISON:  None. FINDINGS: Brain: No evidence of acute infarction, hemorrhage, hydrocephalus, extra-axial collection or mass lesion/mass effect. Few nonspecific white matter hypodensities are compatible with mild chronic microvascular ischemic changes and there is mild volume loss of the brain. Vascular: Calcific atherosclerosis of internal carotid arteries. No hyperdense vessel identified. Skull: Normal. Negative for fracture or focal lesion. Sinuses/Orbits: No acute finding. Other: None.  IMPRESSION: 1. No acute intracranial abnormality identified. 2. Mild chronic microvascular ischemic changes and volume loss of the brain. Electronically Signed   By: Kristine Garbe M.D.   On: 05/03/2019 23:38   Dg Chest Port 1 View  Result Date: 05/05/2019 CLINICAL DATA:  Acute respiratory failure. EXAM: PORTABLE CHEST 1 VIEW COMPARISON:  Radiographs of May 03, 2019. FINDINGS: Stable cardiomediastinal silhouette. Endotracheal and nasogastric tubes are unchanged in position. No pneumothorax or pleural effusion is noted. No acute pulmonary disease is noted. Bony thorax is unremarkable. IMPRESSION: Stable support apparatus. No acute  cardiopulmonary abnormality seen. Electronically Signed   By: Marijo Conception M.D.   On: 05/05/2019 07:29   Dg Chest Port 1 View  Result Date: 05/03/2019 CLINICAL DATA:  68 y/o  M; cardiac arrest and intubation. EXAM: PORTABLE CHEST 1 VIEW COMPARISON:  11/13/2013 chest radiograph. FINDINGS: Stable cardiac silhouette given projection and technique. Endotracheal tube tip projects 4.1 cm above the carina. Enteric tube tip extends below the field of view into the abdomen. Transcutaneous pacing pads. Diffuse pulmonary vascular congestion. No focal consolidation. No pleural effusion or pneumothorax. No acute osseous abnormality is evident. IMPRESSION: Endotracheal tube tip projects 4.1 cm above the carina. Enteric tube tip extends below the field of view into the abdomen. Diffuse pulmonary vascular congestion. Electronically Signed   By: Kristine Garbe M.D.   On: 05/03/2019 22:47   Ct Angio Chest/abd/pel For Dissection W And/or W/wo  Result Date: 05/04/2019 CLINICAL DATA:  68 year old male with diabetes and vascular disease. Evaluate for aortic dissection or pulmonary embolism. EXAM: CT ANGIOGRAPHY CHEST, ABDOMEN AND PELVIS TECHNIQUE: Multidetector CT imaging through the chest, abdomen and pelvis was performed using the standard protocol during bolus administration  of intravenous contrast. Multiplanar reconstructed images and MIPs were obtained and reviewed to evaluate the vascular anatomy. CONTRAST:  171m ISOVUE-370 IOPAMIDOL (ISOVUE-370) INJECTION 76% COMPARISON:  Chest radiograph dated 05/03/2019 FINDINGS: Evaluation is limited due to streak artifact caused by patient's arms. CTA CHEST FINDINGS Cardiovascular: There is moderate cardiomegaly. No pericardial effusion. The thoracic aorta is unremarkable. The visualized origins of the great vessels of the aortic arch appear patent. The central pulmonary arteries are patent. Evaluation of the distal pulmonary arteries is limited due to suboptimal opacification. No large or central pulmonary artery embolus identified. Mediastinum/Nodes: No hilar or mediastinal adenopathy. An enteric tube is noted within the esophagus. The thyroid gland is grossly unremarkable. No mediastinal fluid collection. Lungs/Pleura: Endotracheal tube with tip approximately 2 cm above the carina. Patchy bilateral lower lobe consolidative changes may represent atelectasis versus infiltrate. There is no pleural effusion or pneumothorax. The central airways are patent. Musculoskeletal: Minimally displaced fracture of the anterior right fourth rib. Age indeterminate, likely acute, nondisplaced and incomplete fracture of the sternal body. Review of the MIP images confirms the above findings. CTA ABDOMEN AND PELVIS FINDINGS VASCULAR Aorta: Normal caliber aorta without aneurysm, dissection, vasculitis or significant stenosis. Celiac: Patent without evidence of aneurysm, dissection, vasculitis or significant stenosis. SMA: Patent without evidence of aneurysm, dissection, vasculitis or significant stenosis. Renals: Both renal arteries are patent without evidence of aneurysm, dissection, vasculitis, fibromuscular dysplasia or significant stenosis. IMA: Patent without evidence of aneurysm, dissection, vasculitis or significant stenosis. Inflow: Patent without  evidence of aneurysm, dissection, vasculitis or significant stenosis. Veins: No obvious venous abnormality within the limitations of this arterial phase study. Air within the left common femoral vein, likely iatrogenic and related to intravenous access. Review of the MIP images confirms the above findings. NON-VASCULAR No intra-abdominal free air or free fluid. Hepatobiliary: The liver is grossly unremarkable. The gallbladder is partially contracted. No calcified gallstone. Thickened appearance of the gallbladder wall, likely partially related to underdistention. Pancreas: Unremarkable. No pancreatic ductal dilatation or surrounding inflammatory changes. Spleen: Normal in size without focal abnormality. Adrenals/Urinary Tract: Adrenal glands are unremarkable. Kidneys are normal, without renal calculi, focal lesion, or hydronephrosis. The urinary bladder is decompressed around a Foley catheter. Stomach/Bowel: An enteric tube is noted with tip in the proximal stomach. There is no bowel obstruction. The appendix is unremarkable as visualized. Lymphatic: No  adenopathy. Reproductive: The prostate and seminal vesicles are grossly unremarkable. Other: Small fat containing umbilical hernia. Musculoskeletal: No acute or significant osseous findings. Review of the MIP images confirms the above findings. IMPRESSION: 1. No aortic aneurysm or dissection. No CT evidence of central pulmonary artery embolus. 2. Minimally displaced fracture of the anterior right fourth rib and nondisplaced fracture of the sternal body. 3. Moderate cardiomegaly. 4. Bilateral lower lobe consolidative changes may represent atelectasis versus infiltrate. 5. No bowel obstruction or active inflammation. Electronically Signed   By: Anner Crete M.D.   On: 05/04/2019 00:56    Scheduled Meds: . [START ON 05/06/2019] amLODipine  10 mg Oral Daily  . [START ON 05/06/2019] aspirin  81 mg Oral Daily  . carvedilol  12.5 mg Oral BID  . chlorhexidine  gluconate (MEDLINE KIT)  15 mL Mouth Rinse BID  . insulin aspart  0-9 Units Subcutaneous Q4H  . latanoprost  1 drop Both Eyes QHS  . [START ON 05/06/2019] losartan  50 mg Oral Daily  . mouth rinse  15 mL Mouth Rinse 10 times per day  . polyvinyl alcohol  2 drop Both Eyes Q8H  . timolol  1 drop Both Eyes Daily   Continuous Infusions: . sodium chloride Stopped (05/04/19 1218)  . dexmedetomidine (PRECEDEX) IV infusion 1 mcg/kg/hr (05/05/19 1445)  . dextrose 5 % with kcl      Assessment/Plan:  1. Acute cardiac arrest.  Likely secondary to arrhythmia with his poor ejection fraction.  On aspirin.  Patient extubated and rewarming protocol.  Cardiology to do a cardiac catheterization at some point. 2. Chronic combined systolic congestive heart failure.  Restarted on Coreg 3. Lactic acidosis secondary to cardiac arrest. 4. Type 2 diabetes mellitus with hyperglycemia.  On sliding scale insulin 5. Hypertension.  Restarted Coreg  Code Status:     Code Status Orders  (From admission, onward)         Start     Ordered   05/04/19 0719  Full code  Continuous     05/04/19 0723        Code Status History    Date Active Date Inactive Code Status Order ID Comments User Context   05/03/2019 2352 05/04/2019 0723 Full Code [id number]  Mansy, Arvella Merles, MD ED     Family Communication: As per critical care specialist Disposition Plan: To be determined  Consultants:  Critical care specialist  Cardiology  Neurology  Time spent: 28 minutes  [place]

## 2019-05-05 NOTE — Telephone Encounter (Signed)
Received forms from [employer]  Placed in interoffice mail and sent to [employer]

## 2019-05-05 NOTE — Progress Notes (Signed)
Patient wide awake with morning assessment. He opens eyes spontaneously. Follows commands .  Nods head to questions. MAE. Readjusting self in bed. 0715  Ventilator rate decreased to 8. Propofol decreased to 10. Awaiting arrival of Dr. Sung Amabile for assessment and possible extubation.Rewarming for 4 hours complete at 0900.

## 2019-05-05 NOTE — Progress Notes (Signed)
MD Sommers notified of heart rate sustaining in the 40s, sedation orders changed.

## 2019-05-05 NOTE — Progress Notes (Signed)
Progress Note  Patient Name: Russell [name]. Date of Encounter: 05/05/2019  Primary Cardiologist: Dr. Rockey Situ  Subjective   When I saw the patient, he was still intubated and extremely agitated.  His blood pressure was high due to agitation.  Inpatient Medications    Scheduled Meds:  amLODipine  10 mg Per Tube Daily   carvedilol  12.5 mg Per Tube BID   chlorhexidine gluconate (MEDLINE KIT)  15 mL Mouth Rinse BID   latanoprost  1 drop Both Eyes QHS   lisinopril  20 mg Per Tube Daily   mouth rinse  15 mL Mouth Rinse 10 times per day   polyvinyl alcohol  2 drop Both Eyes Q8H   timolol  1 drop Both Eyes Daily   Continuous Infusions:  sodium chloride Stopped (05/04/19 1218)   sodium chloride Stopped (05/05/19 0918)   PRN Meds: hydrALAZINE   Vital Signs    Vitals:   05/05/19 0755 05/05/19 0800 05/05/19 0830 05/05/19 0900  BP:   (!) 178/101 (!) 178/101  Pulse:   (!) 104 88  Resp:   17 19  Temp:  97.7 F (36.5 C) 98.6 F (37 C) 98.6 F (37 C)  TempSrc:      SpO2: 93%  95% 94%  Weight:      Height:        Intake/Output Summary (Last 24 hours) at 05/05/2019 0919 Last data filed at 05/05/2019 6060 Gross per 24 hour  Intake 2628.64 ml  Output 1080 ml  Net 1548.64 ml   Last 3 Weights 05/04/2019 05/03/2019 12/30/2018  Weight (lbs) 156 lb 15.5 oz 160 lb 159 lb 9.6 oz  Weight (kg) 71.2 kg 72.576 kg 72.394 kg      Telemetry    Normal sinus rhythm with frequent PVCs- Personally Reviewed  ECG    Not done today- Personally Reviewed  Physical Exam   GEN:  Agitated on the vent but he seems to respond to verbal commands Neck: No JVD Cardiac: RRR, no murmurs, rubs, or gallops.  Respiratory: Clear to auscultation bilaterally. GI: Soft, nontender, non-distended  MS: No edema; No deformity. Neuro:  Nonfocal  Psych: Normal affect   Labs    Chemistry Recent Labs  Lab 05/04/19 0215 05/04/19 0504 05/05/19 0443  NA 137 139 143  K 4.5 4.2 3.1*  CL 105 108  110  CO2 _0 GLUCOSE 242* 177* 135*  BUN _1 CREATININE 1.09 0.94 0.94  CALCIUM 9.0 8.4* 7.9*  GFRNONAA >60 >60 >60  GFRAA >60 >60 >60  ANIONGAP _2 Hematology Recent Labs  Lab 05/03/19 2207 05/04/19 0504 05/05/19 0443  WBC 14.9* 13.2* 14.3*  RBC 4.42 4.53 4.36  HGB 13.4 13.6 12.9*  HCT 40.6 40.4 38.5*  MCV 91.9 89.2 88.3  MCH 30.3 30.0 29.6  MCHC 33.0 33.7 33.5  RDW 14.5 14.4 14.2  PLT 184 175 169    Cardiac Enzymes Recent Labs  Lab 05/04/19 0215 05/04/19 0504 05/04/19 1152 05/04/19 1738  TROPONINI 0.69* 0.66* 0.68* 0.50*   No results for input(s): TROPIPOC in the last 168 hours.   BNP Recent Labs  Lab 05/04/19 0215  BNP 78.0     DDimer No results for input(s): DDIMER in the last 168 hours.   Radiology    Dg Abd 1 View  Result Date: 05/04/2019 CLINICAL DATA:  NG tube placement EXAM: ABDOMEN - 1 VIEW COMPARISON:  Radiograph 05/03/2019 FINDINGS: Nasogastric tube extends  the stomach. Side port below the GE junction. Normal bowel-gas pattern. Lung bases clear. IMPRESSION: NG tube in proper position. Electronically Signed   By: Suzy Bouchard M.D.   On: 05/04/2019 05:33   Ct Head Wo Contrast  Result Date: 05/03/2019 CLINICAL DATA:  68 y/o M; altered level of consciousness. Witnessed cardiac arrest, down approximately 4-6 minutes, CPR. EXAM: CT HEAD WITHOUT CONTRAST TECHNIQUE: Contiguous axial images were obtained from the base of the skull through the vertex without intravenous contrast. COMPARISON:  None. FINDINGS: Brain: No evidence of acute infarction, hemorrhage, hydrocephalus, extra-axial collection or mass lesion/mass effect. Few nonspecific white matter hypodensities are compatible with mild chronic microvascular ischemic changes and there is mild volume loss of the brain. Vascular: Calcific atherosclerosis of internal carotid arteries. No hyperdense vessel identified. Skull: Normal. Negative for fracture or focal lesion. Sinuses/Orbits:  No acute finding. Other: None. IMPRESSION: 1. No acute intracranial abnormality identified. 2. Mild chronic microvascular ischemic changes and volume loss of the brain. Electronically Signed   By: Kristine Garbe M.D.   On: 05/03/2019 23:38   Dg Chest Port 1 View  Result Date: 05/05/2019 CLINICAL DATA:  Acute respiratory failure. EXAM: PORTABLE CHEST 1 VIEW COMPARISON:  Radiographs of May 03, 2019. FINDINGS: Stable cardiomediastinal silhouette. Endotracheal and nasogastric tubes are unchanged in position. No pneumothorax or pleural effusion is noted. No acute pulmonary disease is noted. Bony thorax is unremarkable. IMPRESSION: Stable support apparatus. No acute cardiopulmonary abnormality seen. Electronically Signed   By: Marijo Conception M.D.   On: 05/05/2019 07:29   Dg Chest Port 1 View  Result Date: 05/03/2019 CLINICAL DATA:  68 y/o  M; cardiac arrest and intubation. EXAM: PORTABLE CHEST 1 VIEW COMPARISON:  11/13/2013 chest radiograph. FINDINGS: Stable cardiac silhouette given projection and technique. Endotracheal tube tip projects 4.1 cm above the carina. Enteric tube tip extends below the field of view into the abdomen. Transcutaneous pacing pads. Diffuse pulmonary vascular congestion. No focal consolidation. No pleural effusion or pneumothorax. No acute osseous abnormality is evident. IMPRESSION: Endotracheal tube tip projects 4.1 cm above the carina. Enteric tube tip extends below the field of view into the abdomen. Diffuse pulmonary vascular congestion. Electronically Signed   By: Kristine Garbe M.D.   On: 05/03/2019 22:47   Ct Angio Chest/abd/pel For Dissection W And/or W/wo  Result Date: 05/04/2019 CLINICAL DATA:  68 year old male with diabetes and vascular disease. Evaluate for aortic dissection or pulmonary embolism. EXAM: CT ANGIOGRAPHY CHEST, ABDOMEN AND PELVIS TECHNIQUE: Multidetector CT imaging through the chest, abdomen and pelvis was performed using the standard  protocol during bolus administration of intravenous contrast. Multiplanar reconstructed images and MIPs were obtained and reviewed to evaluate the vascular anatomy. CONTRAST:  165m ISOVUE-370 IOPAMIDOL (ISOVUE-370) INJECTION 76% COMPARISON:  Chest radiograph dated 05/03/2019 FINDINGS: Evaluation is limited due to streak artifact caused by patient's arms. CTA CHEST FINDINGS Cardiovascular: There is moderate cardiomegaly. No pericardial effusion. The thoracic aorta is unremarkable. The visualized origins of the great vessels of the aortic arch appear patent. The central pulmonary arteries are patent. Evaluation of the distal pulmonary arteries is limited due to suboptimal opacification. No large or central pulmonary artery embolus identified. Mediastinum/Nodes: No hilar or mediastinal adenopathy. An enteric tube is noted within the esophagus. The thyroid gland is grossly unremarkable. No mediastinal fluid collection. Lungs/Pleura: Endotracheal tube with tip approximately 2 cm above the carina. Patchy bilateral lower lobe consolidative changes may represent atelectasis versus infiltrate. There is no pleural effusion or pneumothorax. The central airways  are patent. Musculoskeletal: Minimally displaced fracture of the anterior right fourth rib. Age indeterminate, likely acute, nondisplaced and incomplete fracture of the sternal body. Review of the MIP images confirms the above findings. CTA ABDOMEN AND PELVIS FINDINGS VASCULAR Aorta: Normal caliber aorta without aneurysm, dissection, vasculitis or significant stenosis. Celiac: Patent without evidence of aneurysm, dissection, vasculitis or significant stenosis. SMA: Patent without evidence of aneurysm, dissection, vasculitis or significant stenosis. Renals: Both renal arteries are patent without evidence of aneurysm, dissection, vasculitis, fibromuscular dysplasia or significant stenosis. IMA: Patent without evidence of aneurysm, dissection, vasculitis or significant  stenosis. Inflow: Patent without evidence of aneurysm, dissection, vasculitis or significant stenosis. Veins: No obvious venous abnormality within the limitations of this arterial phase study. Air within the left common femoral vein, likely iatrogenic and related to intravenous access. Review of the MIP images confirms the above findings. NON-VASCULAR No intra-abdominal free air or free fluid. Hepatobiliary: The liver is grossly unremarkable. The gallbladder is partially contracted. No calcified gallstone. Thickened appearance of the gallbladder wall, likely partially related to underdistention. Pancreas: Unremarkable. No pancreatic ductal dilatation or surrounding inflammatory changes. Spleen: Normal in size without focal abnormality. Adrenals/Urinary Tract: Adrenal glands are unremarkable. Kidneys are normal, without renal calculi, focal lesion, or hydronephrosis. The urinary bladder is decompressed around a Foley catheter. Stomach/Bowel: An enteric tube is noted with tip in the proximal stomach. There is no bowel obstruction. The appendix is unremarkable as visualized. Lymphatic: No adenopathy. Reproductive: The prostate and seminal vesicles are grossly unremarkable. Other: Small fat containing umbilical hernia. Musculoskeletal: No acute or significant osseous findings. Review of the MIP images confirms the above findings. IMPRESSION: 1. No aortic aneurysm or dissection. No CT evidence of central pulmonary artery embolus. 2. Minimally displaced fracture of the anterior right fourth rib and nondisplaced fracture of the sternal body. 3. Moderate cardiomegaly. 4. Bilateral lower lobe consolidative changes may represent atelectasis versus infiltrate. 5. No bowel obstruction or active inflammation. Electronically Signed   By: Anner Crete M.D.   On: 05/04/2019 00:56    Cardiac Studies   Echocardiogram done yesterday:    1. The left ventricle has severely reduced systolic function, with an ejection  fraction of 20-25%. The cavity size was moderately dilated. There is mildly increased left ventricular wall thickness. Left ventricular diastolic Doppler parameters are  consistent with impaired relaxation.  2. Aortic valve regurgitation is trivial by color flow Doppler. No stenosis of the aortic valve.  3. The interatrial septum was not assessed.  Patient Profile     68 y.o. 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  He required 5 shocks in the field with return of spontaneous circulation  Assessment & Plan    1.  Out of hospital cardiac arrest likely due to ventricular tachycardia: No evidence of acute coronary syndrome as his troponin remained flat initially at 0.6 and then went down to 0.5.  He is currently off amiodarone drip. Ventricular tachycardia is likely due to underlying cardiomyopathy.  However, we do have to exclude underlying ischemic etiology. The patient will require a right and left cardiac catheterization once extubated and his mental status is stable.  Hopefully, his neurologic function will allow this. I agree with resuming carvedilol.  2.  Chronic systolic heart failure: I am going to switch lisinopril to losartan with plans to transition him to [place] and possibly adding  spironolactone.  3.  Essential hypertension: The patient was hypotensive on presentation but he is not hypertensive.  Antihypertensive medications were resumed.  We should aim for switching him to [facility] and adding spironolactone and then discontinue amlodipine.  I discussed with the patient's nurse.      For questions or updates, please contact [facility] Please consult [email or web address] for contact info under        Signed, Kathlyn Sacramento, MD  05/05/2019, 9:19 AM

## 2019-05-05 NOTE — Progress Notes (Signed)
eLink Physician-Brief Progress Note Patient Name: Russell [name]. DOB: 01-23-1951 MRN: [id number]   Date of Service  05/05/2019  HPI/Events of Note  Severe agitation - Patient trying to get out of bed on a Fentanyl IV infusion at ceiling and Fentanyl IV PRN. Triglyceride level = 89.   eICU Interventions  Wiil restart Propofol IV infusion. Titrate to RASS = 0 to -1.      Intervention Category Major Interventions: Delirium, psychosis, severe agitation - evaluation and management  Franki Stemen [place] 05/05/2019, 12:59 AM

## 2019-05-05 NOTE — Progress Notes (Signed)
PT Cancellation Note  Patient Details Name: Russell [name]. MRN: [id number] DOB: 1951/06/26   Cancelled Treatment:    Reason Eval/Treat Not Completed: Other (comment)(Per chart review, pt with increased agitation, extubated this AM, and per cardiology consult may undergo cardiac cath during this hospital stay. PT to hold patient until more appropriate for exertional activity s/p extubation and to await cardiology POC.)  Olga Coaster PT, DPT 11:53 AM,05/05/19 [phone]

## 2019-05-05 NOTE — Progress Notes (Signed)
ANTICOAGULATION CONSULT NOTE   Pharmacy Consult for heparin drip Indication: chest pain/ACS  No Known Allergies  Patient Measurements: Height: 5' 8"  (172.7 cm) Weight: 156 lb 15.5 oz (71.2 kg) IBW/kg (Calculated) : 68.4 Heparin Dosing Weight: 72.6 kg  Vital Signs: Temp: 96.4 F (35.8 C) (05/11 0330) BP: 87/61 (05/11 0330)  Labs: Recent Labs    05/03/19 2207 05/04/19 0215  05/04/19 0504 05/04/19 0808 05/04/19 1152 05/04/19 1309 05/04/19 1738 05/04/19 2103 05/05/19 0323  HGB 13.4  --   --  13.6  --   --   --   --   --   --   HCT 40.6  --   --  40.4  --   --   --   --   --   --   PLT 184  --   --  175  --   --   --   --   --   --   APTT  --  84*  --   --  144*  --   --   --   --   --   LABPROT  --  13.6  --   --  14.0  --   --   --   --   --   INR  --  1.1  --   --  1.1  --   --   --   --   --   HEPARINUNFRC  --   --    < > 0.84*  --   --  1.00*  --  0.51 0.62  CREATININE 1.68* 1.09  --  0.94  --   --   --   --   --   --   TROPONINI 0.03* 0.69*  --  0.66*  --  0.68*  --  0.50*  --   --    < > = values in this interval not displayed.    Estimated Creatinine Clearance: 75.8 mL/min (by C-G formula based on SCr of 0.94 mg/dL).   Medical History: Past Medical History:  Diagnosis Date  . Impotence of organic origin   . Pain in joint, shoulder region   . Personal history of colonic polyps     Medications:  Scheduled:  . chlorhexidine gluconate (MEDLINE KIT)  15 mL Mouth Rinse BID  . insulin aspart  0-15 Units Subcutaneous Q4H  . latanoprost  1 drop Both Eyes QHS  . mouth rinse  15 mL Mouth Rinse 10 times per day  . pantoprazole (PROTONIX) IV  40 mg Intravenous QHS  . polyvinyl alcohol  2 drop Both Eyes Q8H  . timolol  1 drop Both Eyes Daily    Assessment: Patient w/ witnessed cardiac arrest, brought to ED s/p CPR, shock x 3, showing Vtach, patient's initial trop at 0.03, LA 7.6, patient w/ h/o of DM and dilated ischemic cardiomyopathy. No anticoagulation PTA  documented. Patient is being started on heparin drip for possible STEMI.  05/10 @ 0500 HL 0.84 supratherapeutic. Will reduce rate to 1050 units/hr and will recheck HL @ 1200, CBC stable; however, trops are rising 0.03 >> 0.69, will continue to monitor.  Goal of Therapy:  Heparin level 0.3-0.7 units/ml Monitor platelets by anticoagulation protocol: Yes   Plan:  05/11 @ 0300 HL 0.62 therapeutic. Will continue current rate and will recheck w/ am labs. CBC stable will continue to monitor.  Tobie Lords, PharmD, BCPS Clinical Pharmacist 05/05/2019

## 2019-05-05 NOTE — Progress Notes (Addendum)
Patient extubated to 3 liters nasal cannula. Voice is hoarse. Left nare NG tube remains in place. All infusing medications discontinued per orders. Dr. Kirke Corin in before extubation to see patient. Patient very disoriented and uncoordinated. Patient trying to move around with great difficulty. Oxygen saturation remains in the middle 90's. Family notified and updated per  Dr. Linna Caprice

## 2019-05-05 NOTE — Progress Notes (Signed)
PULMONARY / CRITICAL CARE MEDICINE  Name: Russell [name]. MRN: [id number] DOB: [birth year]    LOS: 2  Referring Provider:  Dr Arville Care Reason for Referral:  Cardiac arrest and targeted temperature management Brief patient description:  68 y/o male admitted following a VF witnessed arrest while playing baseball.   SUBJ:  Extubated this AM under my supervision. Tolerating well but encephalopathic (improving over course of day). Hypertensive  VITAL SIGNS: BP (!) 167/79   Pulse 81   Temp 97.9 F (36.6 C)   Resp 14   Ht 5\' 8"  (1.727 m)   Wt 71.2 kg   SpO2 97%   BMI 23.87 kg/m   HEMODYNAMICS: CVP:  [4 mmHg-21 mmHg] 4 mmHg  VENTILATOR SETTINGS: Vent Mode: Spontaneous FiO2 (%):  [28 %-30 %] 28 % Set Rate:  [18 bmp] 18 bmp Vt Set:  [500 mL] 500 mL PEEP:  [5 cmH20] 5 cmH20 Pressure Support:  [15 cmH20] 15 cmH20 Plateau Pressure:  [17 cmH20-20 cmH20] 20 cmH20  INTAKE / OUTPUT: I/O last 3 completed shifts: In: 3904.2 [I.V.:3234.2; NG/GT:120; IV Piggyback:550] Out: 2690 [Urine:2590; Emesis/NG output:100]  PHYSICAL EXAMINATION: Gen: NAD HEENT: NCAT, sclerae white Neck: No JVD Lungs: breath sounds full, no wheezes or other adventitious sounds Cardiovascular: RRR, no murmurs Abdomen: Soft, nontender, normal BS Ext: without clubbing, cyanosis, edema Neuro: poorly oriented, +F/C, CNs intact, no focal deficits Skin: Limited exam, no lesions noted   LABS:  BMET Recent Labs  Lab 05/04/19 0215 05/04/19 0504 05/05/19 0443  NA 137 139 143  K 4.5 4.2 3.1*  CL 105 108 110  CO2 24 24 23   BUN 19 18 14   CREATININE 1.09 0.94 0.94  GLUCOSE 242* 177* 135*    Electrolytes Recent Labs  Lab 05/04/19 0215 05/04/19 0504 05/05/19 0443  CALCIUM 9.0 8.4* 7.9*  MG  --  2.2 2.0  PHOS  --  3.7 2.7    CBC Recent Labs  Lab 05/03/19 2207 05/04/19 0504 05/05/19 0443  WBC 14.9* 13.2* 14.3*  HGB 13.4 13.6 12.9*  HCT 40.6 40.4 38.5*  PLT 184 175 169    Coag's Recent Labs   Lab 05/04/19 0215 05/04/19 0808  APTT 84* 144*  INR 1.1 1.1    Sepsis Markers Recent Labs  Lab 05/03/19 2207 05/04/19 0808 05/05/19 0443  LATICACIDVEN 7.6* 1.2  --   PROCALCITON  --  1.78 1.66    ABG Recent Labs  Lab 05/03/19 2245 05/04/19 0500 05/05/19 0500  PHART 7.32* 7.40 7.33*  PCO2ART 38 45 42  PO2ART 87 121* 85    Liver Enzymes No results for input(s): AST, ALT, ALKPHOS, BILITOT, ALBUMIN in the last 168 hours.  Cardiac Enzymes Recent Labs  Lab 05/04/19 0504 05/04/19 1152 05/04/19 1738  TROPONINI 0.66* 0.68* 0.50*    Glucose Recent Labs  Lab 05/04/19 2314 05/05/19 0054 05/05/19 0233 05/05/19 0313 05/05/19 0714 05/05/19 1517  GLUCAP 83 90 108* 128* 89 162*    Imaging Dg Chest Port 1 View  Result Date: 05/05/2019 CLINICAL DATA:  Acute respiratory failure. EXAM: PORTABLE CHEST 1 VIEW COMPARISON:  Radiographs of May 03, 2019. FINDINGS: Stable cardiomediastinal silhouette. Endotracheal and nasogastric tubes are unchanged in position. No pneumothorax or pleural effusion is noted. No acute pulmonary disease is noted. Bony thorax is unremarkable. IMPRESSION: Stable support apparatus. No acute cardiopulmonary abnormality seen. Electronically Signed   By: Lupita Raider M.D.   On: 05/05/2019 07:29   STUDIES:  2D echo pending  CULTURES: Blood cultures x2  Sputum culture> MRSA screen positive  ANTIBIOTICS: Vancomycin 5/9 >> 05/10 Zosyn 5/90 >> 5/10  SIGNIFICANT EVENTS: 05/03/2019: Admitted  LINES/TUBES: Right femoral central venous catheter Right femoral arterial catheter    ASSESSMENT / PLAN: VDRF, resolved - intubated after cardiac arrest P:   Monitor in ICU/SDU post extubation Supplemental O2 as needed to keep SpO2 > 92%  CARDIOVASCULAR A:  OOH VF arrest Dilated cardiomyopathy Sinus bradycardia, resolved Cardiogenic shock, resolved Severe hypertension P: Resume home antihypertensive meds PRN Hydralazine to maintain SBP < 160  mmHg Cards following  RENAL A:   No acute issues P:   Monitor BMET intermittently Monitor I/Os Correct electrolytes as indicated D5W + KCl initiated 05/11  INFECTIOUS A:   Mildly elevated PCT without overt infection P:   Monitor temp, WBC count Micro and abx as above  ENDOCRINE A:   DM2, controlled P:   Blood glucose monitoring with sliding scale insulin every 4 hours  NEUROLOGIC A:   Acute encephalopathy, hypoxic-ischemic  P:   RASS goal: 0  Dexmedetomidine as needed   Billy Fischer, MD PCCM service Mobile [phone] Pager [phone] 05/05/2019 4:30 PM

## 2019-05-05 NOTE — Progress Notes (Signed)
Assisted tele visit to patient with son and daughter.  Melrose Nakayama, RN

## 2019-05-05 NOTE — Progress Notes (Signed)
Pt extubated without complications, placed on 3lpm [place], sats 93%, respiratory 16/min, will continue to monitor.

## 2019-05-05 NOTE — Progress Notes (Signed)
Assisted family with televisit. Emotional support given.   

## 2019-05-05 NOTE — Progress Notes (Signed)
[facility] notified of increased patient agitation, patient lifting himself off the bed, fentanyl is at , 2 of versed given, and heart rate in the 50s anyways. Patient is biting down on the tube, dropping oxygen saturation, and spontaneously awake. Pending new orders.

## 2019-05-06 DIAGNOSIS — I10 Essential (primary) hypertension: Secondary | ICD-10-CM

## 2019-05-06 DIAGNOSIS — I5022 Chronic systolic (congestive) heart failure: Secondary | ICD-10-CM

## 2019-05-06 DIAGNOSIS — I472 Ventricular tachycardia: Principal | ICD-10-CM

## 2019-05-06 LAB — PROCALCITONIN: Procalcitonin: 1.42 ng/mL

## 2019-05-06 LAB — GLUCOSE, CAPILLARY
Glucose-Capillary: >600 mg/dL (ref 70–99)
Glucose-Capillary: 138 mg/dL — ABNORMAL HIGH (ref 70–99)
Glucose-Capillary: 182 mg/dL — ABNORMAL HIGH (ref 70–99)
Glucose-Capillary: 130 mg/dL — ABNORMAL HIGH (ref 70–99)
Glucose-Capillary: 123 mg/dL — ABNORMAL HIGH (ref 70–99)
Glucose-Capillary: 138 mg/dL — ABNORMAL HIGH (ref 70–99)

## 2019-05-06 LAB — BASIC METABOLIC PANEL
Anion gap: 11 (ref 5–15)
Anion gap: 9 (ref 5–15)
BUN: 13 mg/dL (ref 8–23)
BUN: 11 mg/dL (ref 8–23)
CO2: 24 mmol/L (ref 22–32)
CO2: 23 mmol/L (ref 22–32)
Calcium: 8 mg/dL — ABNORMAL LOW (ref 8.9–10.3)
Calcium: 8.5 mg/dL — ABNORMAL LOW (ref 8.9–10.3)
Chloride: 104 mmol/L (ref 98–111)
Chloride: 105 mmol/L (ref 98–111)
Creatinine, Ser: 0.83 mg/dL (ref 0.61–1.24)
Creatinine, Ser: 0.94 mg/dL (ref 0.61–1.24)
GFR calc Af Amer: >60 mL/min (ref >60)
GFR calc Af Amer: >60 mL/min (ref >60)
GFR calc non Af Amer: >60 mL/min (ref >60)
GFR calc non Af Amer: >60 mL/min (ref >60)
Glucose, Bld: 157 mg/dL — ABNORMAL HIGH (ref 70–99)
Glucose, Bld: 151 mg/dL — ABNORMAL HIGH (ref 70–99)
Potassium: 3.7 mmol/L (ref 3.5–5.1)
Potassium: 3.6 mmol/L (ref 3.5–5.1)
Sodium: 139 mmol/L (ref 135–145)
Sodium: 137 mmol/L (ref 135–145)

## 2019-05-06 LAB — CBC
HCT: 35.5 % — ABNORMAL LOW (ref 39.0–52.0)
Hemoglobin: 12.1 g/dL — ABNORMAL LOW (ref 13.0–17.0)
MCH: 29.9 pg (ref 26.0–34.0)
MCHC: 34.1 g/dL (ref 30.0–36.0)
MCV: 87.7 fL (ref 80.0–100.0)
Platelets: 142 10*3/uL — ABNORMAL LOW (ref 150–400)
RBC: 4.05 MIL/uL — ABNORMAL LOW (ref 4.22–5.81)
RDW: 14.6 % (ref 11.5–15.5)
WBC: 9.8 10*3/uL (ref 4.0–10.5)
nRBC: 0 % (ref 0.0–0.2)

## 2019-05-06 LAB — TROPONIN I: Troponin I: 0.14 ng/mL (ref <0.03)

## 2019-05-06 LAB — MAGNESIUM: Magnesium: 1.9 mg/dL (ref 1.7–2.4)

## 2019-05-06 MED ORDER — AMIODARONE HCL IN DEXTROSE 360-4.14 MG/200ML-% IV SOLN
30.0000 mg/h | INTRAVENOUS | Status: DC
  Administered 2019-05-07 (×2): 30 mg/h via INTRAVENOUS
  Filled 2019-05-06 (×2): qty 200

## 2019-05-06 MED ORDER — MIDAZOLAM HCL 2 MG/2ML IJ SOLN
2.0000 mg | INTRAMUSCULAR | Status: AC
  Administered 2019-05-06: 2 mg via INTRAVENOUS

## 2019-05-06 MED ORDER — SODIUM CHLORIDE 0.9% FLUSH: 3.0000 mL | INTRAVENOUS | Status: DC | PRN

## 2019-05-06 MED ORDER — POTASSIUM CHLORIDE 20 MEQ PO PACK
40.0000 meq | PACK | Freq: Once | ORAL | Status: AC
  Administered 2019-05-06: 40 meq
  Filled 2019-05-06: qty 2

## 2019-05-06 MED ORDER — ASPIRIN 81 MG PO CHEW
81.0000 mg | CHEWABLE_TABLET | ORAL | Status: AC
  Administered 2019-05-07: 81 mg via ORAL
  Filled 2019-05-06: qty 1

## 2019-05-06 MED ORDER — AMIODARONE HCL IN DEXTROSE 360-4.14 MG/200ML-% IV SOLN
60.0000 mg/h | INTRAVENOUS | Status: AC
  Administered 2019-05-06 (×2): 60 mg/h via INTRAVENOUS
  Filled 2019-05-06 (×2): qty 200

## 2019-05-06 MED ORDER — SODIUM CHLORIDE 0.9 % IV SOLN: 250.0000 mL | INTRAVENOUS | Status: DC | PRN

## 2019-05-06 MED ORDER — INSULIN ASPART 100 UNIT/ML ~~LOC~~ SOLN: 0.0000 [IU] | Freq: Every day | SUBCUTANEOUS | Status: DC

## 2019-05-06 MED ORDER — POTASSIUM CHLORIDE 20 MEQ/15ML (10%) PO SOLN
40.0000 meq | Freq: Once | ORAL | Status: DC
  Filled 2019-05-06: qty 30

## 2019-05-06 MED ORDER — SODIUM CHLORIDE 0.9 % IV SOLN
INTRAVENOUS | Status: DC
  Administered 2019-05-07: 06:00:00 via INTRAVENOUS

## 2019-05-06 MED ORDER — ENOXAPARIN SODIUM 40 MG/0.4ML ~~LOC~~ SOLN
40.0000 mg | SUBCUTANEOUS | Status: DC
  Administered 2019-05-06 – 2019-05-07 (×2): 40 mg via SUBCUTANEOUS
  Filled 2019-05-06 (×2): qty 0.4

## 2019-05-06 MED ORDER — SODIUM CHLORIDE 0.9% FLUSH
3.0000 mL | Freq: Two times a day (BID) | INTRAVENOUS | Status: DC
  Administered 2019-05-06 – 2019-05-07 (×2): 3 mL via INTRAVENOUS

## 2019-05-06 MED ORDER — INSULIN ASPART 100 UNIT/ML ~~LOC~~ SOLN
0.0000 [IU] | Freq: Three times a day (TID) | SUBCUTANEOUS | Status: DC
  Administered 2019-05-06: 1 [IU] via SUBCUTANEOUS
  Administered 2019-05-07: 2 [IU] via SUBCUTANEOUS
  Administered 2019-05-07: 1 [IU] via SUBCUTANEOUS
  Filled 2019-05-06 (×3): qty 1

## 2019-05-06 MED ORDER — AMIODARONE LOAD VIA INFUSION
150.0000 mg | Freq: Once | INTRAVENOUS | Status: AC
  Administered 2019-05-06: 150 mg via INTRAVENOUS
  Filled 2019-05-06: qty 83.34

## 2019-05-06 NOTE — Evaluation (Signed)
Clinical/Bedside Swallow Evaluation Patient Details  Name: Russell [name]. MRN: [id number] Date of Birth: March 31, 1951  Today's Date: 05/06/2019 Time: SLP Start Time (ACUTE ONLY): 1315 SLP Stop Time (ACUTE ONLY): 1355 SLP Time Calculation (min) (ACUTE ONLY): 40 min  Past Medical History:  Past Medical History:  Diagnosis Date  . Impotence of organic origin   . Pain in joint, shoulder region   . Personal history of colonic polyps    Past Surgical History:  Past Surgical History:  Procedure Laterality Date  . CARDIAC CATHETERIZATION  11/17/2013  . COLONOSCOPY  2009   HPI:  Pt is a 68 y.o. male with a history of vascular disease, diabetes, dilated cardiomyopathy, hypertension who presents to the ED for cardiac arrest.  According to report he was playing baseball, had some chest pain and subsequently arrested.  He had bystander CPR, fire department arrived and provided defibrillation.  He had a total of 5 defibrillations between prehospital fire department and EMS.  EMS intubated him with an 8 oh ET tube.  After defibrillations he had return of spontaneous circulation, he did require Versed 4 mg in route. chest wall soreness.  He complains of intermittent cough post extubation.  Patient was successfully extubated to nasal cannula yesterday.  He does not recall events leading up to his hospitalization or understand why he is here. Pt lived independently of all ADLs at home prior to this event.   Assessment / Plan / Recommendation Clinical Impression  Pt appeared to present w/ adequate oropharyngeal phase swallowing during Lunch meal w/ no overt s/s of aspiration noted. Pt consumed po trials of thin liquids and purees/cooked solids w/ no overt s/s of aspiration noted; clear vocal quality post trials and no decline in respiratory status during/post trials. Laryngeal excursion appeared wfl. Oral phase appeared [facility] for bolus management, mastication of solids, and lingual sweeping/A-P transfer of foods  for swallowing. Pt cleared orally post trials. Pt fed self; assisted in sitting up in bed w/ SLP. Pt was verbally conversive, no agitation or overt confusion noted. He answered all general questions re: self, meal; and followed general commands. Speech volume was min low, but WFL. Pt used the call bell to reach NSG for bathroom need appropriately. Recommend continue w/ current diet ordered - regular consistency w/ thin liquids; general aspiration precautions; Pills one at a time w/ water OR in Puree if needed for easier swallowing. No further skilled ST services indicated for swallowing as pt appears at his baseline. NSG to reconsult if needs indicate while admitted.  SLP Visit Diagnosis: Dysphagia, unspecified (R13.10)    Aspiration Risk  (reduced following general precautions)    Diet Recommendation  Regular diet w/ thin liquids; general aspiration precautions  Medication Administration: Whole meds with liquid(but Whole in Puree IF needed for easier swallowing)    Other  Recommendations Recommended Consults: (n/a) Oral Care Recommendations: Oral care BID;Patient independent with oral care(setup) Other Recommendations: (n/a)   Follow up Recommendations None(for swallowing)      Frequency and Duration (n/a)  (n/a)       Prognosis Prognosis for Safe Diet Advancement: Good Barriers to Reach Goals: (n/a)      Swallow Study   General Date of Onset: 05/03/19 HPI: Pt is a 68 y.o. male with a history of vascular disease, diabetes, dilated cardiomyopathy, hypertension who presents to the ED for cardiac arrest.  According to report he was playing baseball, had some chest pain and subsequently arrested.  He had bystander CPR, fire department arrived  and provided defibrillation.  He had a total of 5 defibrillations between prehospital fire department and EMS.  EMS intubated him with an 8 oh ET tube.  After defibrillations he had return of spontaneous circulation, he did require Versed 4 mg in  route. chest wall soreness.  He complains of intermittent cough post extubation.  Patient was successfully extubated to nasal cannula yesterday.  He does not recall events leading up to his hospitalization or understand why he is here. Pt lived independently of all ADLs at home prior to this event. Type of Study: Bedside Swallow Evaluation Previous Swallow Assessment: none Diet Prior to this Study: Regular;Thin liquids Temperature Spikes Noted: No(wbc 9.8) Respiratory Status: Nasal cannula(2 liters) History of Recent Intubation: Yes Length of Intubations (days): 2 days Date extubated: 05/05/19 Behavior/Cognition: Alert;Cooperative;Pleasant mood Oral Cavity Assessment: Within Functional Limits Oral Care Completed by SLP: Recent completion by staff Oral Cavity - Dentition: Adequate natural dentition Vision: Functional for self-feeding Self-Feeding Abilities: Able to feed self;Needs set up(min) Patient Positioning: Upright in bed(assisted in positioning himself) Baseline Vocal Quality: Normal;Low vocal intensity(but adequate) Volitional Cough: Strong;Congested(slightly) Volitional Swallow: Able to elicit    Oral/Motor/Sensory Function Overall Oral Motor/Sensory Function: Within functional limits   Ice Chips Ice chips: Within functional limits Presentation: Spoon(fed; 3 trials)   Thin Liquid Thin Liquid: Within functional limits Presentation: Cup;Self Fed;Straw(4 trials via each; then few sips via cup during meal)    Nectar Thick Nectar Thick Liquid: Not tested   Honey Thick Honey Thick Liquid: Not tested   Puree Puree: Within functional limits Presentation: Self Fed;Spoon(6+ trials)   Solid     Solid: Within functional limits Presentation: Spoon;Self Fed(6+ trials) Other Comments: stated the meat was "chewier" than the other foods and he took his time       Jerilynn Som, MS, CCC-SLP Brittany Amirault 05/06/2019,1:59 PM

## 2019-05-06 NOTE — Progress Notes (Signed)
  PULMONARY / CRITICAL CARE MEDICINE  Name: Russell [name]. MRN: [id number] DOB: Apr 24, 1951    LOS: 3  Referring Provider:  Dr Arville Care Reason for Referral:  Cardiac arrest and targeted temperature management Brief patient description:  68 y/o male admitted following a VF witnessed arrest while playing baseball.   SUBJ:  Remains mildly confused but cognition improving overall.  No new complaints.  No distress.  VITAL SIGNS: BP 132/85 (BP Location: Left Arm)   Pulse 76   Temp 99 F (37.2 C) (Oral)   Resp 19   Ht 5\' 8"  (1.727 m)   Wt 69.9 kg   SpO2 98%   BMI 23.43 kg/m   HEMODYNAMICS:    VENTILATOR SETTINGS: FiO2 (%):  [28 %] 28 %  INTAKE / OUTPUT: I/O last 3 completed shifts: In: 895.2 [I.V.:895.2] Out: 1560 [Urine:1510; Emesis/NG output:50]  PHYSICAL EXAMINATION: Gen: NAD HEENT: NCAT, sclerae white Neck: No JVD Lungs: breath sounds full, no wheezes or other adventitious sounds Cardiovascular: RRR, no murmurs Abdomen: Soft, nontender, normal BS Ext: without clubbing, cyanosis, edema Neuro: grossly intact Skin: Limited exam, no lesions noted    LABS:  BMET Recent Labs  Lab 05/04/19 0504 05/05/19 0443 05/06/19 0428  NA 139 143 139  K 4.2 3.1* 3.7  CL 108 110 104  CO2 24 23 24   BUN 18 14 13   CREATININE 0.94 0.94 0.83  GLUCOSE 177* 135* 157*    Electrolytes Recent Labs  Lab 05/04/19 0504 05/05/19 0443 05/06/19 0428  CALCIUM 8.4* 7.9* 8.0*  MG 2.2 2.0  --   PHOS 3.7 2.7  --     CBC Recent Labs  Lab 05/04/19 0504 05/05/19 0443 05/06/19 0428  WBC 13.2* 14.3* 9.8  HGB 13.6 12.9* 12.1*  HCT 40.4 38.5* 35.5*  PLT 175 169 142*    Coag's Recent Labs  Lab 05/04/19 0215 05/04/19 0808  APTT 84* 144*  INR 1.1 1.1    Sepsis Markers Recent Labs  Lab 05/03/19 2207 05/04/19 0808 05/05/19 0443 05/06/19 0428  LATICACIDVEN 7.6* 1.2  --   --   PROCALCITON  --  1.78 1.66 1.42    ABG Recent Labs  Lab 05/03/19 2245 05/04/19 0500  05/05/19 0500  PHART 7.32* 7.40 7.33*  PCO2ART 38 45 42  PO2ART 87 121* 85    Liver Enzymes No results for input(s): AST, ALT, ALKPHOS, BILITOT, ALBUMIN in the last 168 hours.  Cardiac Enzymes Recent Labs  Lab 05/04/19 0504 05/04/19 1152 05/04/19 1738  TROPONINI 0.66* 0.68* 0.50*    Glucose Recent Labs  Lab 05/05/19 1517 05/05/19 2126 05/05/19 2327 05/06/19 0319 05/06/19 0750 05/06/19 1141  GLUCAP 162* 163* 135* 138* 182* 130*     ASSESSMENT / PLAN: VDRF, resolved OOH VF arrest Dilated cardiomyopathy Sinus bradycardia, resolved Cardiogenic shock, resolved Severe hypertension, controlled Mildly elevated PCT without overt infection DM2, controlled Hypoxic-ischemic encephalopathy.  Appears to be mild and improving P:   Transfer to telemetry floor today Advance diet and activity SLP consultation requested for neurocognitive testing Further cardiac evaluation and management per cardiology  After transfer, PCCM will sign off. Please call if we can be of further assistance   Billy Fischer, MD PCCM service Mobile [phone] Pager [phone] 05/06/2019 1:09 PM

## 2019-05-06 NOTE — Progress Notes (Signed)
Assisted tele visit to patient with wife, son, and sister.  Ann Lions, RN

## 2019-05-06 NOTE — Progress Notes (Signed)
Patient ID: Russell Joy., male   DOB: 11/22/1951, 68 y.o.   MRN: [id number]  [employer] PROGRESS NOTE  Russell Joy. WUJ:[id number] DOB: [birth year] DOA: 05/03/2019 PCP: Smitty Cords, DO  HPI/Subjective: Patient feeling okay.  When I walked in the room he was on video conference with his family.  They family and update on condition.  Patient offers no complaints of chest pain or shortness of breath.  Objective: Vitals:   05/06/19 1100 05/06/19 1246  BP: 129/83 132/85  Pulse: 78 76  Resp: 15 19  Temp:  99 F (37.2 C)  SpO2: 95% 98%    Filed Weights   05/03/19 2201 05/04/19 0200 05/06/19 0200  Weight: 72.6 kg 71.2 kg 69.9 kg    ROS: Review of Systems  Constitutional: Negative for chills and fever.  Eyes: Negative for blurred vision.  Respiratory: Negative for cough and shortness of breath.   Cardiovascular: Negative for chest pain.  Gastrointestinal: Negative for abdominal pain, constipation, diarrhea, nausea and vomiting.  Genitourinary: Negative for dysuria.  Musculoskeletal: Negative for joint pain.  Neurological: Negative for dizziness and headaches.   Exam: Physical Exam  HENT:  Nose: No mucosal edema.  Mouth/Throat: No oropharyngeal exudate or posterior oropharyngeal edema.  Eyes: Pupils are equal, round, and reactive to light. Conjunctivae, EOM and lids are normal.  Neck: No JVD present. Carotid bruit is not present. No edema present. No thyroid mass and no thyromegaly present.  Cardiovascular: S1 normal and S2 normal. Exam reveals no gallop.  No murmur heard. Pulses:      Dorsalis pedis pulses are 2+ on the right side and 2+ on the left side.  Respiratory: No respiratory distress. He has no wheezes. He has no rhonchi. He has no rales.  GI: Soft. Bowel sounds are normal. There is no abdominal tenderness.  Musculoskeletal:     Right ankle: He exhibits no swelling.     Left ankle: He exhibits no swelling.  Lymphadenopathy:    He has no cervical  adenopathy.  Neurological: He is alert. No cranial nerve deficit.  Skin: Skin is warm. No rash noted. Nails show no clubbing.  Psychiatric: He has a normal mood and affect.      Data Reviewed: Basic Metabolic Panel: Recent Labs  Lab 05/03/19 2207 05/04/19 0215 05/04/19 0504 05/05/19 0443 05/06/19 0428  NA 137 137 139 143 139  K 3.7 4.5 4.2 3.1* 3.7  CL 104 105 108 110 104  CO2 17* GLUCOSE 318* 242* 177* 135* 157*  BUN CREATININE 1.68* 1.09 0.94 0.94 0.83  CALCIUM 8.6* 9.0 8.4* 7.9* 8.0*  MG  --   --  2.2 2.0  --   PHOS  --   --  3.7 2.7  --    CBC: Recent Labs  Lab 05/03/19 2207 05/04/19 0504 05/05/19 0443 05/06/19 0428  WBC 14.9* 13.2* 14.3* 9.8  NEUTROABS  --   --  12.3*  --   HGB 13.4 13.6 12.9* 12.1*  HCT 40.6 40.4 38.5* 35.5*  MCV 91.9 89.2 88.3 87.7  PLT 184 175 169 142*   Cardiac Enzymes: Recent Labs  Lab 05/03/19 2207 05/04/19 0215 05/04/19 0504 05/04/19 1152 05/04/19 1738  TROPONINI 0.03* 0.69* 0.66* 0.68* 0.50*   BNP (last 3 results) Recent Labs    05/04/19 0215  BNP 78.0     CBG: Recent Labs  Lab 05/05/19 2126 05/05/19 2327 05/06/19 0319 05/06/19 0750 05/06/19 1141  GLUCAP 163* 135* 138* 182* 130*    Recent Results (from the past 240 hour(s))  SARS Coronavirus 2 (CEPHEID - Performed in [facility] hospital lab), Hosp Order     Status: None   Collection Time: 05/03/19 10:07 PM  Result Value Ref Range Status   SARS Coronavirus 2 NEGATIVE NEGATIVE Final    Comment: (NOTE) If result is NEGATIVE SARS-CoV-2 target nucleic acids are NOT DETECTED. The SARS-CoV-2 RNA is generally detectable in upper and lower  respiratory specimens during the acute phase of infection. The lowest  concentration of SARS-CoV-2 viral copies this assay can detect is 250  copies / mL. A negative result does not preclude SARS-CoV-2 infection  and should not be used as the sole basis for treatment or other  patient management  decisions.  A negative result may occur with  improper specimen collection / handling, submission of specimen other  than nasopharyngeal swab, presence of viral mutation(s) within the  areas targeted by this assay, and inadequate number of viral copies  (<250 copies / mL). A negative result must be combined with clinical  observations, patient history, and epidemiological information. If result is POSITIVE SARS-CoV-2 target nucleic acids are DETECTED. The SARS-CoV-2 RNA is generally detectable in upper and lower  respiratory specimens dur ing the acute phase of infection.  Positive  results are indicative of active infection with SARS-CoV-2.  Clinical  correlation with patient history and other diagnostic information is  necessary to determine patient infection status.  Positive results do  not rule out bacterial infection or co-infection with other viruses. If result is PRESUMPTIVE POSTIVE SARS-CoV-2 nucleic acids MAY BE PRESENT.   A presumptive positive result was obtained on the submitted specimen  and confirmed on repeat testing.  While 2019 novel coronavirus  (SARS-CoV-2) nucleic acids may be present in the submitted sample  additional confirmatory testing may be necessary for epidemiological  and / or clinical management purposes  to differentiate between  SARS-CoV-2 and other Sarbecovirus currently known to infect humans.  If clinically indicated additional testing with an alternate test  methodology [phone]) is advised. The SARS-CoV-2 RNA is generally  detectable in upper and lower respiratory sp ecimens during the acute  phase of infection. The expected result is Negative. Fact Sheet for Patients:  BoilerBrush.com.cy Fact Sheet for Healthcare Providers: https://pope.com/ This test is not yet approved or cleared by the [place] FDA and has been authorized for detection and/or diagnosis of SARS-CoV-2 by FDA under an  Emergency Use Authorization (EUA).  This EUA will remain in effect (meaning this test can be used) for the duration of the COVID-19 declaration under Section 564(b)(1) of the Act, 21 U.S.C. section 360bbb-3(b)(1), unless the authorization is terminated or revoked sooner. Performed at [facility], [address] [place], [place] [address]   CULTURE, BLOOD (ROUTINE X 2) w Reflex to ID Panel     Status: None (Preliminary result)   Collection Time: 05/04/19  2:15 AM  Result Value Ref Range Status   Specimen Description BLOOD LEFT HAND  Final   Special Requests   Final    BOTTLES DRAWN AEROBIC AND ANAEROBIC Blood Culture results may not be optimal due to an inadequate volume of blood received in culture bottles   Culture   Final    NO GROWTH 2 DAYS Performed at [facility], [address], [place], [place] [address]    Report Status PENDING  Incomplete  MRSA PCR Screening     Status: Abnormal  Collection Time: 05/04/19  2:16 AM  Result Value Ref Range Status   MRSA by PCR POSITIVE (A) NEGATIVE Final    Comment:        The GeneXpert MRSA Assay (FDA approved for NASAL specimens only), is one component of a comprehensive MRSA colonization surveillance program. It is not intended to diagnose MRSA infection nor to guide or monitor treatment for MRSA infections. RESULT CALLED TO, READ BACK BY AND VERIFIED WITH: FELICIA PREUHOMME AT 0426 ON 05/04/2019 JJB Performed at [facility], [address] [place], [place] [address]   CULTURE, BLOOD (ROUTINE X 2) w Reflex to ID Panel     Status: None (Preliminary result)   Collection Time: 05/04/19  2:29 AM  Result Value Ref Range Status   Specimen Description BLOOD LEFT FOREARM  Final   Special Requests   Final    BOTTLES DRAWN AEROBIC AND ANAEROBIC Blood Culture results may not be optimal due to an inadequate volume of blood received in culture bottles   Culture   Final    NO GROWTH 2 DAYS Performed at  [facility], [address] [place], [place] [address]    Report Status PENDING  Incomplete  Culture, respiratory (non-expectorated)     Status: None (Preliminary result)   Collection Time: 05/04/19  8:04 AM  Result Value Ref Range Status   Specimen Description   Final    TRACHEAL ASPIRATE Performed at [facility], [address] [place], [place] [address]    Special Requests   Final    NONE Performed at [facility], [address] [place], [place] [address]    Gram Stain   Final    RARE WBC PRESENT, PREDOMINANTLY PMN FEW GRAM POSITIVE COCCI IN CHAINS RARE GRAM NEGATIVE RODS    Culture   Final    FEW Consistent with normal respiratory flora. Performed at [facility], [address] [place], [place] [address]    Report Status PENDING  Incomplete     Studies: Dg Chest Port 1 View  Result Date: 05/05/2019 CLINICAL DATA:  Acute respiratory failure. EXAM: PORTABLE CHEST 1 VIEW COMPARISON:  Radiographs of May 03, 2019. FINDINGS: Stable cardiomediastinal silhouette. Endotracheal and nasogastric tubes are unchanged in position. No pneumothorax or pleural effusion is noted. No acute pulmonary disease is noted. Bony thorax is unremarkable. IMPRESSION: Stable support apparatus. No acute cardiopulmonary abnormality seen. Electronically Signed   By: Lupita RaiderJames  Green Jr M.D.   On: 05/05/2019 07:29    Scheduled Meds: . amLODipine  10 mg Oral Daily  . aspirin  81 mg Oral Daily  . carvedilol  12.5 mg Oral BID  . enoxaparin (LOVENOX) injection  40 mg Subcutaneous Q24H  . insulin aspart  0-5 Units Subcutaneous QHS  . insulin aspart  0-9 Units Subcutaneous TID WC  . latanoprost  1 drop Both Eyes QHS  . losartan  50 mg Oral Daily  . polyvinyl alcohol  2 drop Both Eyes Q8H  . timolol  1 drop Both Eyes Daily   Continuous Infusions:  Assessment/Plan:  1. Acute cardiac arrest.  Could be secondary to arrhythmia with his poor ejection fraction.  Cardiology  to take for a cardiac catheterization at some point.  Potentially tomorrow. 2. Chronic systolic congestive heart failure.  Restarted yesterday and Coreg.  Low-dose losartan started today. 3. Lactic acidosis secondary to cardiac arrest 4. Type 2 diabetes mellitus with hyperglycemia.  On sliding scale insulin 5. Hypertension on Coreg and losartan  Code Status:  Code Status History    Date Active Date Inactive Code Status Order ID Comments User Context   05/04/2019 0723 05/06/2019 0954 Full Code [id number]  Andreas Ohm, NP Inpatient   05/03/2019 2352 05/04/2019 0723 Full Code [id number]  Mansy, Vernetta Honey, MD ED     Family Communication: Family was on video conference. Disposition Plan: To be determined  Consultants:  Cardiology  Time spent: 30 minutes  Linford Quintela [employer]

## 2019-05-06 NOTE — Progress Notes (Signed)
Per Dr Bard Herbert orders to stop the rewarming phase and remove temp foley. He is aware that the stop time for rewarming is about 4:00 am 05/07/19. Orders to stop and remove.

## 2019-05-06 NOTE — Progress Notes (Signed)
Assisted tele visit to patient with wife.  Andrzej Scully William, RN  

## 2019-05-06 NOTE — Progress Notes (Addendum)
Patient ID: Russell Joy., male   DOB: 03-May-1951, 68 y.o.   MRN: [id number]  Code blue was called. Patient went into rapid V tach when I arrived Dr. Juliette Alcide from the ER was present in the room. Patient was awake. He denies any chest pain. His blood pressure was bit on the lower side in the 90s. He received 1 g of IV lidocaine. Blood pressure drop down patient started feeling dizzy lightheaded with continued rapid heart rate. Patient was given 2 mg of IV Versed and was shocked with 150 J times one. He converted to sinus tach 100-- 115.  Spoke with Dr. Chilton Si on the phone and informed about the patient. She recommends IV bolus amiodarone and amiodarone drip thereafter.   plans for cardiac cath per cardiology noted, Check BMP and mag and replete if low  Patient has severe cardiomyopathy EF of 20 to 25%.  Will update ICU attending Dr. Belia Heman  Critical time 25 minutes

## 2019-05-06 NOTE — Progress Notes (Signed)
Patient transferred to ICU room 6, wife sharon notified by this RN. RN noticed patient was in what appeared to be Vtach in the 240's-260's, patient alert and responsive, strong pulses, stable BP upon entering room at approx 1740. Rapid Response called, then changed to code blue, Dr Darnelle Catalan from ED and Dr patel to bedside. Patient given 2 versed, and lidocaine, and shocked x1, and transferred to ICU.

## 2019-05-06 NOTE — ED Provider Notes (Signed)
Called to see patient.  Patient was recently transferred out of the ICU.  He is now in V. tach with a pulse heart rate of 234.  He is awake alert and talking.  We gave him 2 of IV Versed for sedation and 1 g of IV lidocaine.  This brought his heart rate down to 209 but did not convert him.  His blood pressure also fell to 94 systolic therefore we cardioverted him at 120 J.  Patient was sedated by the Versed at this point.  He converted immediately to normal sinus rhythm.  Dr. Allena Katz was present for at least half of this and spoke with the attending physician we will transfer the patient to the ICU I walked with the patient to the ICU he was transferred stable.  He had no other arrhythmias or other problems.  His blood pressure got up to 126 systolic prior to moving.  His regular attending physician will reassume his care.   Arnaldo Natal, MD 05/06/19 [phone]

## 2019-05-06 NOTE — H&P (View-Only) (Signed)
Progress Note  Patient Name: Russell [name]. Date of Encounter: 05/06/2019  Primary Cardiologist: Rockey Situ  Subjective   Patient notes chest wall soreness.  He complains of intermittent cough.  Patient was successfully extubated to nasal cannula yesterday.  He does not recall events leading up to his hospitalization or understand why he is here.  Inpatient Medications    Scheduled Meds: . amLODipine  10 mg Oral Daily  . aspirin  81 mg Oral Daily  . carvedilol  12.5 mg Oral BID  . chlorhexidine gluconate (MEDLINE KIT)  15 mL Mouth Rinse BID  . insulin aspart  0-9 Units Subcutaneous Q4H  . latanoprost  1 drop Both Eyes QHS  . losartan  50 mg Oral Daily  . mouth rinse  15 mL Mouth Rinse 10 times per day  . polyvinyl alcohol  2 drop Both Eyes Q8H  . timolol  1 drop Both Eyes Daily   Continuous Infusions: . sodium chloride Stopped (05/04/19 1218)  . dexmedetomidine (PRECEDEX) IV infusion 0.5 mcg/kg/hr (05/06/19 0316)  . dextrose 5 % with kcl 50 mL/hr at 05/05/19 1829   PRN Meds: hydrALAZINE   Vital Signs    Vitals:   05/06/19 0200 05/06/19 0300 05/06/19 0700 05/06/19 0800  BP:   122/80 129/71  Pulse:   85   Resp:   (!) 21 18  Temp:  98.4 F (36.9 C) 99 F (37.2 C)   TempSrc:      SpO2:   93%   Weight: 69.9 kg     Height:        Intake/Output Summary (Last 24 hours) at 05/06/2019 0827 Last data filed at 05/06/2019 0600 Gross per 24 hour  Intake 156.8 ml  Output 1000 ml  Net -843.2 ml   Last 3 Weights 05/06/2019 05/04/2019 05/03/2019  Weight (lbs) 154 lb 1.6 oz 156 lb 15.5 oz 160 lb  Weight (kg) 69.9 kg 71.2 kg 72.576 kg      Telemetry    Normal sinus rhythm and sinus bradycardia with frequent PVCs. - Personally Reviewed  ECG    EKG (05/04/2019): Sinus bradycardia with IVCD - Personally Reviewed  Physical Exam   GEN: No acute distress.   Neck: No JVD Cardiac: RRR, no murmurs, rubs, or gallops.  Respiratory:  Sounds mildly diminished throughout. GI: Soft,  nontender, non-distended  MS: No edema; No deformity. Neuro:  Nonfocal  Psych: Alert and oriented x2.  Repeats same questions frequently during exam.  Labs    Chemistry Recent Labs  Lab 05/04/19 0504 05/05/19 0443 05/06/19 0428  NA 139 143 139  K 4.2 3.1* 3.7  CL 108 110 104  CO2 24 23 24   GLUCOSE 177* 135* 157*  BUN 18 14 13   CREATININE 0.94 0.94 0.83  CALCIUM 8.4* 7.9* 8.0*  GFRNONAA >60 >60 >60  GFRAA >60 >60 >60  ANIONGAP 7 10 11      Hematology Recent Labs  Lab 05/04/19 0504 05/05/19 0443 05/06/19 0428  WBC 13.2* 14.3* 9.8  RBC 4.53 4.36 4.05*  HGB 13.6 12.9* 12.1*  HCT 40.4 38.5* 35.5*  MCV 89.2 88.3 87.7  MCH 30.0 29.6 29.9  MCHC 33.7 33.5 34.1  RDW 14.4 14.2 14.6  PLT 175 169 142*    Cardiac Enzymes Recent Labs  Lab 05/04/19 0215 05/04/19 0504 05/04/19 1152 05/04/19 1738  TROPONINI 0.69* 0.66* 0.68* 0.50*   No results for input(s): TROPIPOC in the last 168 hours.   BNP Recent Labs  Lab 05/04/19 0215  BNP  78.0     DDimer No results for input(s): DDIMER in the last 168 hours.   Radiology    Dg Chest Port 1 View  Result Date: 05/05/2019 CLINICAL DATA:  Acute respiratory failure. EXAM: PORTABLE CHEST 1 VIEW COMPARISON:  Radiographs of May 03, 2019. FINDINGS: Stable cardiomediastinal silhouette. Endotracheal and nasogastric tubes are unchanged in position. No pneumothorax or pleural effusion is noted. No acute pulmonary disease is noted. Bony thorax is unremarkable. IMPRESSION: Stable support apparatus. No acute cardiopulmonary abnormality seen. Electronically Signed   By: Marijo Conception M.D.   On: 05/05/2019 07:29    Cardiac Studies   Echocardiogram (05/04/2019):  1. The left ventricle has severely reduced systolic function, with an ejection fraction of 20-25%. The cavity size was moderately dilated. There is mildly increased left ventricular wall thickness. Left ventricular diastolic Doppler parameters are  consistent with impaired  relaxation.  2. Aortic valve regurgitation is trivial by color flow Doppler. No stenosis of the aortic valve.  3. The interatrial septum was not assessed.  Patient Profile     68 y.o. 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  Assessment & Plan    Cardiac arrest due to ventricular tachycardia: Suspect most likely due to underlying cardiomyopathy.  Troponin trend is flat with mild elevation, not consistent with acute coronary syndrome.  Frequent PVCs noted on telemetry but otherwise no significant arrhythmia.  Continue carvedilol 12.5 mg daily.  No indication to restart amiodarone at this time.  Maintain electrolytes for target potassium and magnesium greater than 4.0 and 2.0, respectively.  Plan for cardiac catheterization tomorrow to exclude ischemic substrate.  He will need EP consultation for consideration of ICD during this admission versus LifeVest placement and outpatient EP follow-up.  Chronic systolic heart failure due to nonischemic cardiomyopathy: Patient appears euvolemic on exam.  LVEF noted to have dropped from prior assessment around 2014 (40%) to 20-25% during this admission.  Maintain net even fluid balance.  Continue carvedilol 12.5 mg twice daily.  Continue losartan 50 mg daily with plan to transition to Entresto at some point in the future.  Start spironolactone 25 mg daily.  Favor weaning off amlodipine, if necessary, to allow for escalation of evidence-based heart failure therapy.  Plan for right and left heart catheterization tomorrow to exclude interval development of CAD.  Last catheterization in 2014 reportedly showed no significant CAD.  I have reviewed the risks, indications, and alternatives to cardiac catheterization, possible angioplasty, and stenting with the patient. Risks include but are not limited to  bleeding, infection, vascular injury, stroke, myocardial infection, arrhythmia, kidney injury, radiation-related injury in the case of prolonged fluoroscopy use, emergency cardiac surgery, and death. The patient understands the risks of serious complication is 1-2 in 0814 with diagnostic cardiac cath and 1-2% or less with angioplasty/stenting.  I have spoken with his wife, son, and daughter by phone, and they are in agreement with proceeding with catheterization.  Hypertension: Labile but overall improved following extubation and resolution of agitation.  Wean amlodipine.  Continue losartan and carvedilol, as above.  Add spironolactone 25 mg daily.  For questions or updates, please contact [place] Please consult [email or web address] for contact info under [facility] Cardiology.    Signed, Nelva Bush, MD  05/06/2019, 8:27 AM

## 2019-05-06 NOTE — Significant Event (Signed)
Rapid Response Event Note  Overview: Time Called: 1744 Arrival Time: 1744 Event Type: Cardiac  Initial Focused Assessment: Arrived in patient's room and patient in Vtach with a pulse. Code Blue called, see interventions in code blue record.  Plan of Care (if not transferred): Transferred to ICU 6.  Event Summary: Name of Physician Notified: Dr. Enedina Finner at [phone]  Name of Consulting Physician Notified: Dr. Darnelle Catalan at [phone]  Outcome: Coded and survived, Transferred (Comment)(never lost pulse, transfer to ICU 6)  Event End Time: 1748  Russell [name] [facility]

## 2019-05-06 NOTE — Progress Notes (Signed)
Patient alert and oriented but to situation. He is forgetful intermit. No complaints of pain or shortness of breath. Per Orders removed NG and Foley. Discontinued rewarming therapy per MD. PT and swallowing evaluation pending. Awaiting for transfer.

## 2019-05-06 NOTE — Progress Notes (Signed)
PT Cancellation Note  Patient Details Name: Russell [name]. MRN: [id number] DOB: 1951/03/20   Cancelled Treatment:    Reason Eval/Treat Not Completed: Patient declined, no reason specified Pt extubated 5/11 AM, on warming protocol until tomorrow AM, spoke with nursing who indeed asks [place] to hold PT until 5/13.  Malachi Pro, DPT 05/06/2019, 9:56 AM

## 2019-05-06 NOTE — Progress Notes (Signed)
Progress Note  Patient Name: Russell [name]. Date of Encounter: 05/06/2019  Primary Cardiologist: Rockey Situ  Subjective   Patient notes chest wall soreness.  He complains of intermittent cough.  Patient was successfully extubated to nasal cannula yesterday.  He does not recall events leading up to his hospitalization or understand why he is here.  Inpatient Medications    Scheduled Meds: . amLODipine  10 mg Oral Daily  . aspirin  81 mg Oral Daily  . carvedilol  12.5 mg Oral BID  . chlorhexidine gluconate (MEDLINE KIT)  15 mL Mouth Rinse BID  . insulin aspart  0-9 Units Subcutaneous Q4H  . latanoprost  1 drop Both Eyes QHS  . losartan  50 mg Oral Daily  . mouth rinse  15 mL Mouth Rinse 10 times per day  . polyvinyl alcohol  2 drop Both Eyes Q8H  . timolol  1 drop Both Eyes Daily   Continuous Infusions: . sodium chloride Stopped (05/04/19 1218)  . dexmedetomidine (PRECEDEX) IV infusion 0.5 mcg/kg/hr (05/06/19 0316)  . dextrose 5 % with kcl 50 mL/hr at 05/05/19 1829   PRN Meds: hydrALAZINE   Vital Signs    Vitals:   05/06/19 0200 05/06/19 0300 05/06/19 0700 05/06/19 0800  BP:   122/80 129/71  Pulse:   85   Resp:   (!) 21 18  Temp:  98.4 F (36.9 C) 99 F (37.2 C)   TempSrc:      SpO2:   93%   Weight: 69.9 kg     Height:        Intake/Output Summary (Last 24 hours) at 05/06/2019 0827 Last data filed at 05/06/2019 0600 Gross per 24 hour  Intake 156.8 ml  Output 1000 ml  Net -843.2 ml   Last 3 Weights 05/06/2019 05/04/2019 05/03/2019  Weight (lbs) 154 lb 1.6 oz 156 lb 15.5 oz 160 lb  Weight (kg) 69.9 kg 71.2 kg 72.576 kg      Telemetry    Normal sinus rhythm and sinus bradycardia with frequent PVCs. - Personally Reviewed  ECG    EKG (05/04/2019): Sinus bradycardia with IVCD - Personally Reviewed  Physical Exam   GEN: No acute distress.   Neck: No JVD Cardiac: RRR, no murmurs, rubs, or gallops.  Respiratory:  Sounds mildly diminished throughout. GI: Soft,  nontender, non-distended  MS: No edema; No deformity. Neuro:  Nonfocal  Psych: Alert and oriented x2.  Repeats same questions frequently during exam.  Labs    Chemistry Recent Labs  Lab 05/04/19 0504 05/05/19 0443 05/06/19 0428  NA 139 143 139  K 4.2 3.1* 3.7  CL 108 110 104  CO2 24 23 24   GLUCOSE 177* 135* 157*  BUN 18 14 13   CREATININE 0.94 0.94 0.83  CALCIUM 8.4* 7.9* 8.0*  GFRNONAA >60 >60 >60  GFRAA >60 >60 >60  ANIONGAP 7 10 11      Hematology Recent Labs  Lab 05/04/19 0504 05/05/19 0443 05/06/19 0428  WBC 13.2* 14.3* 9.8  RBC 4.53 4.36 4.05*  HGB 13.6 12.9* 12.1*  HCT 40.4 38.5* 35.5*  MCV 89.2 88.3 87.7  MCH 30.0 29.6 29.9  MCHC 33.7 33.5 34.1  RDW 14.4 14.2 14.6  PLT 175 169 142*    Cardiac Enzymes Recent Labs  Lab 05/04/19 0215 05/04/19 0504 05/04/19 1152 05/04/19 1738  TROPONINI 0.69* 0.66* 0.68* 0.50*   No results for input(s): TROPIPOC in the last 168 hours.   BNP Recent Labs  Lab 05/04/19 0215  BNP  78.0     DDimer No results for input(s): DDIMER in the last 168 hours.   Radiology    Dg Chest Port 1 View  Result Date: 05/05/2019 CLINICAL DATA:  Acute respiratory failure. EXAM: PORTABLE CHEST 1 VIEW COMPARISON:  Radiographs of May 03, 2019. FINDINGS: Stable cardiomediastinal silhouette. Endotracheal and nasogastric tubes are unchanged in position. No pneumothorax or pleural effusion is noted. No acute pulmonary disease is noted. Bony thorax is unremarkable. IMPRESSION: Stable support apparatus. No acute cardiopulmonary abnormality seen. Electronically Signed   By: Marijo Conception M.D.   On: 05/05/2019 07:29    Cardiac Studies   Echocardiogram (05/04/2019):  1. The left ventricle has severely reduced systolic function, with an ejection fraction of 20-25%. The cavity size was moderately dilated. There is mildly increased left ventricular wall thickness. Left ventricular diastolic Doppler parameters are  consistent with impaired  relaxation.  2. Aortic valve regurgitation is trivial by color flow Doppler. No stenosis of the aortic valve.  3. The interatrial septum was not assessed.  Patient Profile     68 y.o. 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  Assessment & Plan    Cardiac arrest due to ventricular tachycardia: Suspect most likely due to underlying cardiomyopathy.  Troponin trend is flat with mild elevation, not consistent with acute coronary syndrome.  Frequent PVCs noted on telemetry but otherwise no significant arrhythmia.  Continue carvedilol 12.5 mg daily.  No indication to restart amiodarone at this time.  Maintain electrolytes for target potassium and magnesium greater than 4.0 and 2.0, respectively.  Plan for cardiac catheterization tomorrow to exclude ischemic substrate.  He will need EP consultation for consideration of ICD during this admission versus LifeVest placement and outpatient EP follow-up.  Chronic systolic heart failure due to nonischemic cardiomyopathy: Patient appears euvolemic on exam.  LVEF noted to have dropped from prior assessment around 2014 (40%) to 20-25% during this admission.  Maintain net even fluid balance.  Continue carvedilol 12.5 mg twice daily.  Continue losartan 50 mg daily with plan to transition to Entresto at some point in the future.  Start spironolactone 25 mg daily.  Favor weaning off amlodipine, if necessary, to allow for escalation of evidence-based heart failure therapy.  Plan for right and left heart catheterization tomorrow to exclude interval development of CAD.  Last catheterization in 2014 reportedly showed no significant CAD.  I have reviewed the risks, indications, and alternatives to cardiac catheterization, possible angioplasty, and stenting with the patient. Risks include but are not limited to  bleeding, infection, vascular injury, stroke, myocardial infection, arrhythmia, kidney injury, radiation-related injury in the case of prolonged fluoroscopy use, emergency cardiac surgery, and death. The patient understands the risks of serious complication is 1-2 in 8315 with diagnostic cardiac cath and 1-2% or less with angioplasty/stenting.  I have spoken with his wife, son, and daughter by phone, and they are in agreement with proceeding with catheterization.  Hypertension: Labile but overall improved following extubation and resolution of agitation.  Wean amlodipine.  Continue losartan and carvedilol, as above.  Add spironolactone 25 mg daily.  For questions or updates, please contact [place] Please consult [email or web address] for contact info under [facility] Cardiology.    Signed, Nelva Bush, MD  05/06/2019, 8:27 AM

## 2019-05-07 ENCOUNTER — Encounter: Payer: Self-pay | Admitting: Physician Assistant

## 2019-05-07 ENCOUNTER — Inpatient Hospital Stay: Admit: 2019-05-07 | Payer: BLUE CROSS/BLUE SHIELD | Admitting: Interventional Cardiology

## 2019-05-07 ENCOUNTER — Encounter
Admission: EM | Disposition: A | Discharge status: Other Acute Inpatient Hospital | Payer: Self-pay | Source: Home / Self Care | Attending: Internal Medicine

## 2019-05-07 ENCOUNTER — Encounter: Payer: Self-pay | Admitting: Internal Medicine

## 2019-05-07 ENCOUNTER — Inpatient Hospital Stay (HOSPITAL_COMMUNITY)
Admission: AD | Admit: 2019-05-07 | Discharge: 2019-05-10 | DRG: 227 | Disposition: A | Discharge status: Home / Self Care | Payer: BLUE CROSS/BLUE SHIELD | Source: Other Acute Inpatient Hospital | Attending: Interventional Cardiology | Admitting: Interventional Cardiology

## 2019-05-07 DIAGNOSIS — I462 Cardiac arrest due to underlying cardiac condition: Secondary | ICD-10-CM | POA: Diagnosis not present

## 2019-05-07 DIAGNOSIS — I422 Other hypertrophic cardiomyopathy: Secondary | ICD-10-CM | POA: Diagnosis not present

## 2019-05-07 DIAGNOSIS — I493 Ventricular premature depolarization: Secondary | ICD-10-CM | POA: Diagnosis not present

## 2019-05-07 DIAGNOSIS — X58XXXA Exposure to other specified factors, initial encounter: Secondary | ICD-10-CM | POA: Diagnosis present

## 2019-05-07 DIAGNOSIS — J90 Pleural effusion, not elsewhere classified: Secondary | ICD-10-CM | POA: Diagnosis not present

## 2019-05-07 DIAGNOSIS — Z1159 Encounter for screening for other viral diseases: Secondary | ICD-10-CM | POA: Diagnosis not present

## 2019-05-07 DIAGNOSIS — I2510 Atherosclerotic heart disease of native coronary artery without angina pectoris: Secondary | ICD-10-CM | POA: Diagnosis not present

## 2019-05-07 DIAGNOSIS — I5022 Chronic systolic (congestive) heart failure: Secondary | ICD-10-CM

## 2019-05-07 DIAGNOSIS — E1136 Type 2 diabetes mellitus with diabetic cataract: Secondary | ICD-10-CM | POA: Diagnosis present

## 2019-05-07 DIAGNOSIS — S2231XA Fracture of one rib, right side, initial encounter for closed fracture: Secondary | ICD-10-CM | POA: Diagnosis not present

## 2019-05-07 DIAGNOSIS — I472 Ventricular tachycardia: Secondary | ICD-10-CM | POA: Diagnosis not present

## 2019-05-07 DIAGNOSIS — I4901 Ventricular fibrillation: Secondary | ICD-10-CM | POA: Diagnosis not present

## 2019-05-07 DIAGNOSIS — Z800 Family history of malignant neoplasm of digestive organs: Secondary | ICD-10-CM

## 2019-05-07 DIAGNOSIS — Z79899 Other long term (current) drug therapy: Secondary | ICD-10-CM

## 2019-05-07 DIAGNOSIS — Z8249 Family history of ischemic heart disease and other diseases of the circulatory system: Secondary | ICD-10-CM | POA: Diagnosis not present

## 2019-05-07 DIAGNOSIS — E1169 Type 2 diabetes mellitus with other specified complication: Secondary | ICD-10-CM | POA: Diagnosis present

## 2019-05-07 DIAGNOSIS — Z959 Presence of cardiac and vascular implant and graft, unspecified: Secondary | ICD-10-CM

## 2019-05-07 DIAGNOSIS — I10 Essential (primary) hypertension: Secondary | ICD-10-CM | POA: Diagnosis not present

## 2019-05-07 DIAGNOSIS — I469 Cardiac arrest, cause unspecified: Secondary | ICD-10-CM

## 2019-05-07 DIAGNOSIS — Z833 Family history of diabetes mellitus: Secondary | ICD-10-CM

## 2019-05-07 DIAGNOSIS — I110 Hypertensive heart disease with heart failure: Secondary | ICD-10-CM | POA: Diagnosis present

## 2019-05-07 DIAGNOSIS — I428 Other cardiomyopathies: Secondary | ICD-10-CM

## 2019-05-07 DIAGNOSIS — Z95810 Presence of automatic (implantable) cardiac defibrillator: Secondary | ICD-10-CM | POA: Diagnosis not present

## 2019-05-07 DIAGNOSIS — S2222XA Fracture of body of sternum, initial encounter for closed fracture: Secondary | ICD-10-CM | POA: Diagnosis not present

## 2019-05-07 DIAGNOSIS — E872 Acidosis: Secondary | ICD-10-CM | POA: Diagnosis not present

## 2019-05-07 HISTORY — DX: Ventricular tachycardia: I47.2

## 2019-05-07 HISTORY — DX: Unspecified systolic (congestive) heart failure: I50.20

## 2019-05-07 HISTORY — PX: RIGHT/LEFT HEART CATH AND CORONARY ANGIOGRAPHY: CATH118266

## 2019-05-07 HISTORY — DX: Ventricular premature depolarization: I49.3

## 2019-05-07 HISTORY — DX: Ventricular tachycardia, unspecified: I47.20

## 2019-05-07 HISTORY — DX: Atherosclerotic heart disease of native coronary artery without angina pectoris: I25.10

## 2019-05-07 HISTORY — DX: Type 2 diabetes mellitus without complications: E11.9

## 2019-05-07 HISTORY — DX: Other cardiomyopathies: I42.8

## 2019-05-07 LAB — BASIC METABOLIC PANEL
Anion gap: 9 (ref 5–15)
BUN: 12 mg/dL (ref 8–23)
CO2: 23 mmol/L (ref 22–32)
Calcium: 8.6 mg/dL — ABNORMAL LOW (ref 8.9–10.3)
Chloride: 106 mmol/L (ref 98–111)
Creatinine, Ser: 0.88 mg/dL (ref 0.61–1.24)
GFR calc Af Amer: >60 mL/min (ref >60)
GFR calc non Af Amer: >60 mL/min (ref >60)
Glucose, Bld: 160 mg/dL — ABNORMAL HIGH (ref 70–99)
Potassium: 3.5 mmol/L (ref 3.5–5.1)
Sodium: 138 mmol/L (ref 135–145)

## 2019-05-07 LAB — MRSA PCR SCREENING: MRSA by PCR: NEGATIVE

## 2019-05-07 LAB — GLUCOSE, CAPILLARY
Glucose-Capillary: 154 mg/dL — ABNORMAL HIGH (ref 70–99)
Glucose-Capillary: 125 mg/dL — ABNORMAL HIGH (ref 70–99)
Glucose-Capillary: 139 mg/dL — ABNORMAL HIGH (ref 70–99)
Glucose-Capillary: 133 mg/dL — ABNORMAL HIGH (ref 70–99)
Glucose-Capillary: 137 mg/dL — ABNORMAL HIGH (ref 70–99)

## 2019-05-07 LAB — CBC
HCT: 39.5 % (ref 39.0–52.0)
Hemoglobin: 13.6 g/dL (ref 13.0–17.0)
MCH: 30 pg (ref 26.0–34.0)
MCHC: 34.4 g/dL (ref 30.0–36.0)
MCV: 87 fL (ref 80.0–100.0)
Platelets: 165 10*3/uL (ref 150–400)
RBC: 4.54 MIL/uL (ref 4.22–5.81)
RDW: 14.7 % (ref 11.5–15.5)
WBC: 10.5 10*3/uL (ref 4.0–10.5)
nRBC: 0 % (ref 0.0–0.2)

## 2019-05-07 LAB — CULTURE, RESPIRATORY W GRAM STAIN: Culture: NORMAL

## 2019-05-07 LAB — TROPONIN I
Troponin I: 0.15 ng/mL (ref <0.03)
Troponin I: 0.12 ng/mL (ref <0.03)

## 2019-05-07 SURGERY — RIGHT/LEFT HEART CATH AND CORONARY ANGIOGRAPHY
Anesthesia: Moderate Sedation

## 2019-05-07 MED ORDER — INSULIN ASPART 100 UNIT/ML ~~LOC~~ SOLN
0.0000 [IU] | Freq: Three times a day (TID) | SUBCUTANEOUS | Status: DC
  Administered 2019-05-07 – 2019-05-08 (×2): 2 [IU] via SUBCUTANEOUS
  Administered 2019-05-08 – 2019-05-09 (×2): 3 [IU] via SUBCUTANEOUS
  Administered 2019-05-09: 18:00:00 2 [IU] via SUBCUTANEOUS
  Administered 2019-05-09: 08:00:00 3 [IU] via SUBCUTANEOUS
  Administered 2019-05-10: 2 [IU] via SUBCUTANEOUS

## 2019-05-07 MED ORDER — HYDRALAZINE HCL 20 MG/ML IJ SOLN: 10.0000 mg | INTRAMUSCULAR | Status: DC | PRN

## 2019-05-07 MED ORDER — IOHEXOL 300 MG/ML  SOLN
INTRAMUSCULAR | Status: DC | PRN
  Administered 2019-05-07: 80 mL via INTRAVENOUS

## 2019-05-07 MED ORDER — LOSARTAN POTASSIUM 50 MG PO TABS: 50.0000 mg | ORAL_TABLET | Freq: Every day | ORAL | Status: DC

## 2019-05-07 MED ORDER — CARVEDILOL 12.5 MG PO TABS: 12.5000 mg | ORAL_TABLET | Freq: Two times a day (BID) | ORAL | Status: DC

## 2019-05-07 MED ORDER — ENOXAPARIN SODIUM 40 MG/0.4ML ~~LOC~~ SOLN: 40.0000 mg | SUBCUTANEOUS | Status: DC

## 2019-05-07 MED ORDER — SPIRONOLACTONE 25 MG PO TABS: 25.0000 mg | ORAL_TABLET | Freq: Every day | ORAL | Status: DC

## 2019-05-07 MED ORDER — TIMOLOL MALEATE 0.5 % OP SOLN
1.0000 [drp] | Freq: Every day | OPHTHALMIC | Status: DC
  Administered 2019-05-08 – 2019-05-10 (×3): 1 [drp] via OPHTHALMIC
  Filled 2019-05-07: qty 5

## 2019-05-07 MED ORDER — HEPARIN SODIUM (PORCINE) 1000 UNIT/ML IJ SOLN
INTRAMUSCULAR | Status: DC | PRN
  Administered 2019-05-07: 3500 [IU] via INTRAVENOUS

## 2019-05-07 MED ORDER — ACETAMINOPHEN 325 MG PO TABS: 650.0000 mg | ORAL_TABLET | ORAL | Status: DC | PRN

## 2019-05-07 MED ORDER — HEPARIN (PORCINE) IN NACL 1000-0.9 UT/500ML-% IV SOLN
INTRAVENOUS | Status: AC
  Filled 2019-05-07: qty 1000

## 2019-05-07 MED ORDER — NITROGLYCERIN 0.4 MG SL SUBL: 0.4000 mg | SUBLINGUAL_TABLET | SUBLINGUAL | Status: DC | PRN

## 2019-05-07 MED ORDER — AMIODARONE HCL IN DEXTROSE 360-4.14 MG/200ML-% IV SOLN: 60.0000 mg/h | INTRAVENOUS | Status: DC

## 2019-05-07 MED ORDER — HEPARIN (PORCINE) IN NACL 1000-0.9 UT/500ML-% IV SOLN
INTRAVENOUS | Status: DC | PRN
  Administered 2019-05-07: 1000 mL

## 2019-05-07 MED ORDER — LATANOPROST 0.005 % OP SOLN
1.0000 [drp] | Freq: Every day | OPHTHALMIC | Status: DC
  Administered 2019-05-07 – 2019-05-09 (×3): 1 [drp] via OPHTHALMIC
  Filled 2019-05-07: qty 2.5

## 2019-05-07 MED ORDER — AMIODARONE HCL IN DEXTROSE 360-4.14 MG/200ML-% IV SOLN: 30.0000 mg/h | INTRAVENOUS | Status: DC

## 2019-05-07 MED ORDER — AMLODIPINE BESYLATE 10 MG PO TABS: 10.0000 mg | ORAL_TABLET | Freq: Every day | ORAL | Status: DC

## 2019-05-07 MED ORDER — AMIODARONE HCL IN DEXTROSE 360-4.14 MG/200ML-% IV SOLN
30.0000 mg/h | INTRAVENOUS | Status: DC
  Administered 2019-05-07: 30 mg/h via INTRAVENOUS

## 2019-05-07 MED ORDER — MIDAZOLAM HCL 2 MG/2ML IJ SOLN
INTRAMUSCULAR | Status: AC
  Filled 2019-05-07: qty 2

## 2019-05-07 MED ORDER — CHLORHEXIDINE GLUCONATE CLOTH 2 % EX PADS: 6.0000 | MEDICATED_PAD | Freq: Every day | CUTANEOUS | Status: DC

## 2019-05-07 MED ORDER — MUPIROCIN 2 % EX OINT
1.0000 "application " | TOPICAL_OINTMENT | Freq: Two times a day (BID) | CUTANEOUS | Status: DC
  Administered 2019-05-07: 1 via NASAL
  Filled 2019-05-07: qty 22

## 2019-05-07 MED ORDER — CEFAZOLIN SODIUM-DEXTROSE 2-4 GM/100ML-% IV SOLN
2.0000 g | INTRAVENOUS | Status: AC
  Administered 2019-05-08: 2 g via INTRAVENOUS
  Filled 2019-05-07: qty 100

## 2019-05-07 MED ORDER — FENTANYL CITRATE (PF) 100 MCG/2ML IJ SOLN
INTRAMUSCULAR | Status: AC
  Filled 2019-05-07: qty 2

## 2019-05-07 MED ORDER — VERAPAMIL HCL 2.5 MG/ML IV SOLN
INTRAVENOUS | Status: DC | PRN
  Administered 2019-05-07: 2.5 mg via INTRA_ARTERIAL

## 2019-05-07 MED ORDER — SPIRONOLACTONE 25 MG PO TABS
25.0000 mg | ORAL_TABLET | Freq: Every day | ORAL | Status: DC
  Administered 2019-05-08 – 2019-05-10 (×3): 25 mg via ORAL
  Filled 2019-05-07 (×3): qty 1

## 2019-05-07 MED ORDER — SACUBITRIL-VALSARTAN 24-26 MG PO TABS
1.0000 | ORAL_TABLET | Freq: Two times a day (BID) | ORAL | Status: DC
  Administered 2019-05-07 – 2019-05-10 (×6): 1 via ORAL
  Filled 2019-05-07 (×7): qty 1

## 2019-05-07 MED ORDER — LOSARTAN POTASSIUM 25 MG PO TABS: 25.0000 mg | ORAL_TABLET | Freq: Every day | ORAL | Status: DC

## 2019-05-07 MED ORDER — FENTANYL CITRATE (PF) 100 MCG/2ML IJ SOLN
INTRAMUSCULAR | Status: DC | PRN
  Administered 2019-05-07: 25 ug via INTRAVENOUS

## 2019-05-07 MED ORDER — SODIUM CHLORIDE 0.9% FLUSH: 3.0000 mL | INTRAVENOUS | Status: DC | PRN

## 2019-05-07 MED ORDER — CARVEDILOL 12.5 MG PO TABS
12.5000 mg | ORAL_TABLET | Freq: Two times a day (BID) | ORAL | Status: DC
  Administered 2019-05-07 – 2019-05-09 (×4): 12.5 mg via ORAL
  Filled 2019-05-07 (×4): qty 1

## 2019-05-07 MED ORDER — SODIUM CHLORIDE 0.9% FLUSH: 3.0000 mL | Freq: Two times a day (BID) | INTRAVENOUS | Status: DC

## 2019-05-07 MED ORDER — HEPARIN SODIUM (PORCINE) 1000 UNIT/ML IJ SOLN
INTRAMUSCULAR | Status: AC
  Filled 2019-05-07: qty 1

## 2019-05-07 MED ORDER — POLYVINYL ALCOHOL 1.4 % OP SOLN: 2.0000 [drp] | Freq: Three times a day (TID) | OPHTHALMIC | 0 refills | Status: DC

## 2019-05-07 MED ORDER — MUPIROCIN 2 % EX OINT: 1.0000 "application " | TOPICAL_OINTMENT | Freq: Two times a day (BID) | CUTANEOUS | 0 refills | Status: DC

## 2019-05-07 MED ORDER — SODIUM CHLORIDE 0.9 % IV SOLN: 250.0000 mL | INTRAVENOUS | Status: DC | PRN

## 2019-05-07 MED ORDER — SODIUM CHLORIDE 0.9 % IV SOLN
80.0000 mg | INTRAVENOUS | Status: AC
  Administered 2019-05-08: 80 mg
  Filled 2019-05-07: qty 2

## 2019-05-07 MED ORDER — ASPIRIN EC 81 MG PO TBEC
81.0000 mg | DELAYED_RELEASE_TABLET | Freq: Every day | ORAL | Status: DC
  Administered 2019-05-08: 81 mg via ORAL
  Filled 2019-05-07: qty 1

## 2019-05-07 MED ORDER — SODIUM CHLORIDE 0.9% FLUSH
3.0000 mL | Freq: Two times a day (BID) | INTRAVENOUS | Status: DC
  Administered 2019-05-08: 3 mL via INTRAVENOUS

## 2019-05-07 MED ORDER — SODIUM CHLORIDE 0.9 % IV SOLN: INTRAVENOUS | Status: DC

## 2019-05-07 MED ORDER — MUPIROCIN 2 % EX OINT
TOPICAL_OINTMENT | Freq: Two times a day (BID) | CUTANEOUS | Status: DC
  Administered 2019-05-07 – 2019-05-09 (×4): via NASAL
  Administered 2019-05-10: 1 via NASAL
  Filled 2019-05-07 (×2): qty 22

## 2019-05-07 MED ORDER — SPIRONOLACTONE 25 MG PO TABS
25.0000 mg | ORAL_TABLET | Freq: Every day | ORAL | Status: DC
  Administered 2019-05-07: 25 mg via ORAL
  Filled 2019-05-07 (×2): qty 1

## 2019-05-07 MED ORDER — MIDAZOLAM HCL 2 MG/2ML IJ SOLN
INTRAMUSCULAR | Status: DC | PRN
  Administered 2019-05-07: 1 mg via INTRAVENOUS

## 2019-05-07 MED ORDER — ONDANSETRON HCL 4 MG/2ML IJ SOLN: 4.0000 mg | Freq: Four times a day (QID) | INTRAMUSCULAR | Status: DC | PRN

## 2019-05-07 MED ORDER — VERAPAMIL HCL 2.5 MG/ML IV SOLN
INTRAVENOUS | Status: AC
  Filled 2019-05-07: qty 2

## 2019-05-07 MED ORDER — INSULIN ASPART 100 UNIT/ML ~~LOC~~ SOLN: 0.0000 [IU] | Freq: Every day | SUBCUTANEOUS | Status: DC

## 2019-05-07 MED ORDER — POLYVINYL ALCOHOL 1.4 % OP SOLN
2.0000 [drp] | Freq: Three times a day (TID) | OPHTHALMIC | Status: DC
  Administered 2019-05-07 – 2019-05-09 (×5): 2 [drp] via OPHTHALMIC
  Filled 2019-05-07: qty 15

## 2019-05-07 MED ORDER — AMIODARONE HCL IN DEXTROSE 360-4.14 MG/200ML-% IV SOLN
30.0000 mg/h | INTRAVENOUS | Status: DC
  Administered 2019-05-07 – 2019-05-08 (×2): 30 mg/h via INTRAVENOUS
  Filled 2019-05-07: qty 200

## 2019-05-07 SURGICAL SUPPLY — 13 items
CATH BALLN WEDGE 5F 110CM (CATHETERS) ×2 IMPLANT
CATH INFINITI 5 FR JL3.5 (CATHETERS) ×2 IMPLANT
CATH INFINITI 5FR ANG PIGTAIL (CATHETERS) ×2 IMPLANT
CATH INFINITI JR4 5F (CATHETERS) ×2 IMPLANT
DEVICE RAD TR BAND REGULAR (VASCULAR PRODUCTS) ×2 IMPLANT
GLIDESHEATH SLEND SS 6F .021 (SHEATH) ×2 IMPLANT
GUIDEWIRE .025 260CM (WIRE) ×2 IMPLANT
GUIDEWIRE EMER 3M J .025X150CM (WIRE) ×2 IMPLANT
KIT MANI 3VAL PERCEP (MISCELLANEOUS) ×2 IMPLANT
KIT RIGHT HEART (MISCELLANEOUS) ×2 IMPLANT
PACK CARDIAC CATH (CUSTOM PROCEDURE TRAY) ×2 IMPLANT
SHEATH GLIDE SLENDER 4/5FR (SHEATH) ×2 IMPLANT
WIRE ROSEN-J .035X260CM (WIRE) ×2 IMPLANT

## 2019-05-07 NOTE — Progress Notes (Signed)
SLP Cancellation Note  Patient Details Name: Russell [name]. MRN: [id number] DOB: 11/12/1951   Cancelled treatment:       Reason Eval/Treat Not Completed: Patient at procedure or test/unavailable  SLP will follow up for cognitive communication evaluation when patient is available.  Dollene Primrose, MS/CCC- SLP  Leandrew Koyanagi 05/07/2019, 11:43 AM

## 2019-05-07 NOTE — Brief Op Note (Signed)
BRIEF CARDIAC CATHETERIZATION NOTE  DATE: 05/07/2019  TIME: 11:40 AM  PATIENT:  Russell [name].  68 y.o. male  PRE-OPERATIVE DIAGNOSIS:  Cardiac arrest, ventricular tachycardia  POST-OPERATIVE DIAGNOSIS:  NICM  PROCEDURE:  Procedure(s): RIGHT/LEFT HEART CATH AND CORONARY ANGIOGRAPHY (N/A)  SURGEON:  Surgeon(s) and Role:    * Karizma Cheek, MD - Primary  FINDINGS: 1. Mild luminal irregularities in the LAD.  No angiographically significant coronary artery disease. 2. Normal left and right heart filling pressures. 3. Severely reduced left ventricular systolic function (LVEF 25-30%).  RECOMMENDATIONS: 1. Optimize evidence-based heart failure therapy. 2. Continue IV amiodarone. 3. Will discuss with EP regarding transfer to [facility] for formal EP evaluation and possible ICD placement.  Yvonne Kendall, MD [facility] Pager: [phone]

## 2019-05-07 NOTE — Progress Notes (Signed)
Progress Note  Patient Name: Russell [name]. Date of Encounter: 05/07/2019  Primary Cardiologist: Mariah Milling  Subjective   No complaints today.  Patient denies chest pain, shortness of breath, and lightheadedness.  He does not recall events of yesterday evening, when he had recurrent sustained ventricular tachycardia with loss of consciousness and successful defibrillation.  Inpatient Medications    Scheduled Meds: . [MAR Hold] amLODipine  10 mg Oral Daily  . [MAR Hold] aspirin  81 mg Oral Daily  . [MAR Hold] carvedilol  12.5 mg Oral BID  . [MAR Hold] Chlorhexidine Gluconate Cloth  6 each Topical Q0600  . [MAR Hold] enoxaparin (LOVENOX) injection  40 mg Subcutaneous Q24H  . [MAR Hold] insulin aspart  0-5 Units Subcutaneous QHS  . [MAR Hold] insulin aspart  0-9 Units Subcutaneous TID WC  . [MAR Hold] latanoprost  1 drop Both Eyes QHS  . [MAR Hold] losartan  50 mg Oral Daily  . [MAR Hold] mupirocin ointment  1 application Nasal BID  . [MAR Hold] polyvinyl alcohol  2 drop Both Eyes Q8H  . sodium chloride flush  3 mL Intravenous Q12H  . [MAR Hold] timolol  1 drop Both Eyes Daily   Continuous Infusions: . sodium chloride    . sodium chloride 10 mL/hr at 05/07/19 0800  . amiodarone 30 mg/hr (05/07/19 0800)   PRN Meds: sodium chloride, [MAR Hold] hydrALAZINE, sodium chloride flush   Vital Signs    Vitals:   05/07/19 0900 05/07/19 0942 05/07/19 1000 05/07/19 1037  BP: (!) 156/100 (!) 156/100 (!) 161/105 (!) 148/102  Pulse: 87 88 87 85  Resp: (!) 23  (!) 25 (!) 21  Temp:    98.1 F (36.7 C)  TempSrc:    Oral  SpO2: 100%  100% 98%  Weight:    71.5 kg  Height:    5\' 8"  (1.727 m)    Intake/Output Summary (Last 24 hours) at 05/07/2019 1151 Last data filed at 05/07/2019 0800 Gross per 24 hour  Intake 296.03 ml  Output 2700 ml  Net -2403.97 ml   Last 3 Weights 05/07/2019 05/06/2019 05/06/2019  Weight (lbs) 157 lb 10.1 oz 157 lb 11.2 oz 154 lb 1.6 oz  Weight (kg) 71.5 kg 71.532  kg 69.9 kg      Telemetry    Normal sinus rhythm with frequent PVCs.  Yesterday afternoon at 5:39 PM, patient went into monomorphic ventricular tachycardia with a rate of 230 bpm.  Total episode lasted approximately 10 minutes before defibrillation was performed. - Personally Reviewed  ECG    Normal sinus rhythm with PVCs, IVCD, and anterolateral and inferior T wave inversions. - Personally Reviewed  Physical Exam   GEN: No acute distress.   Neck: No JVD Cardiac: RRR with 2/6 systolic murmur.  No rubs or gallops. Respiratory: Clear to auscultation bilaterally. GI: Soft, nontender, non-distended  MS: No edema; No deformity. Neuro:  Nonfocal  Psych: Normal affect   Labs    Chemistry Recent Labs  Lab 05/06/19 0428 05/06/19 2212 05/07/19 0411  NA 139 137 138  K 3.7 3.6 3.5  CL 104 105 106  CO2 24 23 23   GLUCOSE 157* 151* 160*  BUN 13 11 12   CREATININE 0.83 0.94 0.88  CALCIUM 8.0* 8.5* 8.6*  GFRNONAA >60 >60 >60  GFRAA >60 >60 >60  ANIONGAP 11 9 9      Hematology Recent Labs  Lab 05/05/19 0443 05/06/19 0428 05/07/19 0411  WBC 14.3* 9.8 10.5  RBC 4.36 4.05*  4.54  HGB 12.9* 12.1* 13.6  HCT 38.5* 35.5* 39.5  MCV 88.3 87.7 87.0  MCH 29.6 29.9 30.0  MCHC 33.5 34.1 34.4  RDW 14.2 14.6 14.7  PLT 169 142* 165    Cardiac Enzymes Recent Labs  Lab 05/04/19 1738 05/06/19 2212 05/07/19 0001 05/07/19 0555  TROPONINI 0.50* 0.14* 0.15* 0.12*   No results for input(s): TROPIPOC in the last 168 hours.   BNP Recent Labs  Lab 05/04/19 0215  BNP 78.0     DDimer No results for input(s): DDIMER in the last 168 hours.   Radiology    No results found.  Cardiac Studies   L/RHC (prelim): Mild luminal irregularities in the LAD.  No significant CAD.  Normal left and right heart filling pressures.  Severely reduced LVEF with global hypokinesis.  Echocardiogram (05/04/2019): 1. The left ventricle has severely reduced systolic function, with an ejection fraction of  20-25%. The cavity size was moderately dilated. There is mildly increased left ventricular wall thickness. Left ventricular diastolic Doppler parameters are  consistent with impaired relaxation. 2. Aortic valve regurgitation is trivial by color flow Doppler. No stenosis of the aortic valve. 3. The interatrial septum was not assessed.  Patient Profile     68 y.o. male with history of chronic systolic heart failure due to nonischemic cardiomyopathy admitted with cardiac arrest secondary to VT.  He was successfully extubated but had recurrent sustained VT yesterday with successful defibrillation and return to baseline mental status.  Assessment & Plan    Cardiac arrest and ventricular tachycardia: Suspect this is most likely due to underlying cardiomyopathy.  Catheterization today shows no significant CAD.  Patient currently on amiodarone infusion.  Continue amiodarone infusion overnight.  Could consider transition to oral amiodarone load if patient remains electrically stable.  Maintain potassium and magnesium levels greater than 4.0 and 2.0, respectively.  I have reached out to EP team at [facility] for further recommendations.  Patient may require transfer to [facility] for formal EP consultation and consideration of ICD placement.  Chronic systolic heart failure due to nonischemic cardiomyopathy: Patient is euvolemic, with normal left and right heart filling pressures.  Findings not consistent with a low output heart failure either.  Maintain net even fluid balance.  Continue carvedilol 12.5 mg twice daily.  Consider up titration as heart rate and blood pressure allow.  Continue losartan 50 mg daily.  Consider transition to [facility] in the future.  Will start spironolactone 25 mg daily.  Hypertension: Blood pressure suboptimally controlled.  Add spironolactone and consider escalation of losartan versus transitioning to [place].    For questions or updates, please contact [facility]  HeartCare Please consult [email or web address] for contact info under [facility] Cardiology.     Signed, Yvonne Kendall, MD  05/07/2019, 11:51 AM

## 2019-05-07 NOTE — H&P (Addendum)
Cardiology Admission History and Physical:   Patient ID: Russell Joyavid Schuermann Jr. MRN: [id number]; DOB: 08/04/1951   Admission date: 05/03/2019  Primary Care Provider: Smitty CordsKaramalegos, Alexander J, DO Primary Cardiologist: Dr. Mariah MillingGollan Primary Electrophysiologist:  None   Chief Complaint: [facility] for formal EP consultation / consideration of ICD placement following cardiac arrest   Patient Profile:   Russell JoyDavid Kohn Jr. is a 68 y.o. male with known history of chronic systolic heart failure (EF 20-25%) due to nonischemic cardiomyopathy, hypertension and frequent PVCs, DM2, and who presented to [facility] emergency department after a witnessed out of hospital cardiac arrest with VT while playing baseball, requiring 5 shocks in the field with ROSC.  History of Present Illness:   Russell [name] is a 68 year old male with PMH as above.  He denied a past history of smoking or drug use but reported current alcohol use.  Family history is significant for hypertension and diabetes in his mother.  He was last seen by [facility] cardiology in July 2019.   2014 echo showed EF 25 to 30%.   Last cardiac catheterization done in 2019 and showed no significant CAD with subsequent improvement in EF to 40%.  On 05/04/2019, he was reportedly out playing baseball when he suddenly developed chest pain, collapse, and became unresponsive.  Bystander started CPR prior to EMS arrival.  Upon EMS arrival, patient was in V. fib and required 5 shocks while in the field with subsequent ROSC.  He was intubated while still in the field by EMS and given for milligrams of Versed in route to the hospital.  Total downtime estimated at approximately 5 minutes.  At the time of his arrival to [facility] ED, he was moving his extremities; however, he had no purposeful movements.  He was admitted admitted for cardiac arrest secondary to VT to the ICU and started on dopamine and gentle IV hydration to maintain mean arterial blood pressure greater than 65%. He  underwent targeted temperature management with change from 33 degrees protocol to 36 degrees protocol.  Significant imaging included CT angios chest which showed no aortic aneurysm and no dissection or evidence of central pulmonary arterial embolus.  Also noted was a minimally displaced fracture of the anterior right fourth rib and nondisplaced fracture of the sternal body were noted.  CT head was negative for acute intracranial abnormality. COVID-19 testing performed and negative.  He was successfully extubated but had recurrent sustained VT 05/06/2019 with successful defibrillation and return to baseline mental status.  He was started on amiodarone infusion and remained in sinus rhythm.  On 05/07/2019, he underwent right and left heart catheterization with mild luminal irregularities in the LAD but no significant obstructive CAD.  Normal left and right heart filling pressures and severely reduced LVEF (20-25%) with global hypokinesis were also noted. Case was discussed with [facility] EP [facility] with recommendation for transfer for further evaluation and formal consideration of ICD implantation.  Transfer was set up through [facility] and EMTALA form completed.  Past Medical History:  Diagnosis Date  . Impotence of organic origin   . Pain in joint, shoulder region   . Personal history of colonic polyps     Past Surgical History:  Procedure Laterality Date  . CARDIAC CATHETERIZATION  11/17/2013  . COLONOSCOPY  2009  . RIGHT/LEFT HEART CATH AND CORONARY ANGIOGRAPHY N/A 05/07/2019   Procedure: RIGHT/LEFT HEART CATH AND CORONARY ANGIOGRAPHY;  Surgeon: Yvonne KendallEnd, Latiffany Harwick, MD;  Location: [facility] INVASIVE CV LAB;  Service: Cardiovascular;  Laterality:  N/A;     Medications Prior to Admission: Prior to Admission medications   Medication Sig Start Date Thayden Lemire Date Taking? Authorizing Provider  amLODipine (NORVASC) 10 MG tablet Take 1 tablet (10 mg total) by mouth daily. 06/24/18  Yes Karamalegos, Netta Neat, DO  carvedilol (COREG) 25 MG tablet Take 1 tablet (25 mg total) by mouth 2 (two) times daily. 06/24/18  Yes Karamalegos, Netta Neat, DO  Cholecalciferol (VITAMIN D3) 2000 units TABS Take by mouth.   Yes [provider]  dorzolamide-timolol (COSOPT) 22.3-6.8 MG/ML ophthalmic solution Place 1 drop into both eyes 2 (two) times daily.   Yes [provider]  latanoprost (XALATAN) 0.005 % ophthalmic solution Place 1 drop into both eyes at bedtime.  05/30/18  Yes [provider]  lisinopril (PRINIVIL,ZESTRIL) 40 MG tablet Take 1 tablet (40 mg total) by mouth daily. 06/24/18  Yes Karamalegos, Netta Neat, DO  metFORMIN (GLUCOPHAGE-XR) 500 MG 24 hr tablet Take 1 tablet (500 mg total) by mouth daily with breakfast. 06/24/18  Yes Karamalegos, Netta Neat, DO  timolol (TIMOPTIC) 0.5 % ophthalmic solution Place 1 drop into both eyes 2 (two) times daily.  06/18/18  Yes [provider]  Acetaminophen (TYLENOL PO) Take 1,000 mg by mouth every 4 (four) hours as needed (mild pain).     [provider]  amiodarone (NEXTERONE PREMIX) 360-4.14 MG/200ML-% SOLN Inject 30 mg/hr into the vein continuous. 05/07/19   Alford Highland, MD  aspirin EC 81 MG tablet Take 1 tablet (81 mg total) by mouth daily. Patient not taking: Reported on 05/04/2019 08/22/17   Smitty Cords, DO  carvedilol (COREG) 12.5 MG tablet Take 1 tablet (12.5 mg total) by mouth 2 (two) times daily. 05/07/19   Alford Highland, MD  cetirizine (ZYRTEC) 10 MG tablet Take 10 mg by mouth daily. 04/13/17   [provider]  losartan (COZAAR) 50 MG tablet Take 1 tablet (50 mg total) by mouth daily. 05/08/19   Alford Highland, MD  mupirocin ointment (BACTROBAN) 2 % Place 1 application into the nose 2 (two) times daily. 05/07/19   Alford Highland, MD  polyvinyl alcohol (LIQUIFILM TEARS) 1.4 % ophthalmic solution Place 2 drops into both eyes every 8 (eight) hours. 05/07/19   Alford Highland, MD  sildenafil  (REVATIO) 20 MG tablet Take 2-3 pills about 30 min prior to sex. Start with 2 and increase as needed. 12/30/18   Karamalegos, Netta Neat, DO  spironolactone (ALDACTONE) 25 MG tablet Take 1 tablet (25 mg total) by mouth daily. 05/08/19   Alford Highland, MD     Allergies:   No Known Allergies  Social History:   Social History   Socioeconomic History  . Marital status: Married    Spouse name: Not on file  . Number of children: Not on file  . Years of education: Not on file  . Highest education level: Not on file  Occupational History  . Occupation: [employer]  Social Needs  . Financial resource strain: Not on file  . Food insecurity:    Worry: Not on file    Inability: Not on file  . Transportation needs:    Medical: Not on file    Non-medical: Not on file  Tobacco Use  . Smoking status: Never Smoker  . Smokeless tobacco: Never Used  Substance and Sexual Activity  . Alcohol use: Yes    Comment: occassional  . Drug use: No  . Sexual activity: Not on file  Lifestyle  . Physical activity:  Days per week: Not on file    Minutes per session: Not on file  . Stress: Not on file  Relationships  . Social connections:    Talks on phone: Not on file    Gets together: Not on file    Attends religious service: Not on file    Active member of club or organization: Not on file    Attends meetings of clubs or organizations: Not on file    Relationship status: Not on file  . Intimate partner violence:    Fear of current or ex partner: Not on file    Emotionally abused: Not on file    Physically abused: Not on file    Forced sexual activity: Not on file  Other Topics Concern  . Not on file  Social History Narrative  . Not on file    Family History:  The patient's family history includes Colon cancer in his father; Diabetes in his mother; Hypertension in his mother.    ROS:  Please see the history of present illness.  Review of Systems  Unable to perform ROS: Other   All other systems reviewed and are negative.  Currently, patient without complaints  Physical Exam/Data:   Vitals:   05/07/19 1230 05/07/19 1300 05/07/19 1400 05/07/19 1500  BP: (!) 139/93     Pulse: 82 82 70 84  Resp: (!) 23 (!) 27 (!) 23 (!) 26  Temp:      TempSrc:      SpO2: 100% 97% 97% 99%  Weight:      Height:        Intake/Output Summary (Last 24 hours) at 05/07/2019 1516 Last data filed at 05/07/2019 1500 Gross per 24 hour  Intake 364.95 ml  Output 3250 ml  Net -2885.05 ml   Filed Weights   05/06/19 0200 05/06/19 1246 05/07/19 1037  Weight: 69.9 kg 71.5 kg 71.5 kg   Body mass index is 23.97 kg/m.  General:  Well nourished, well developed, in no acute distress HEENT: normal Neck: No JVD Cardiac:   RRR; 2/6 systolic murmur.  No rubs or gallops. Lungs:  clear to auscultation bilaterally, no wheezing, rhonchi or rales  Abd: soft, nontender, no hepatomegaly  Ext: no bilateral lower extremity edema Musculoskeletal:  No deformities, BUE and BLE strength normal and equal Skin: warm and dry  Neuro:  CNs 2-12 intact, no focal abnormalities noted Psych:  Normal affect    EKG:  The ECG that was done 05/07/2019 was personally reviewed and demonstrates normal sinus rhythm, PVCs, IVCD, and anterior lateral and inferior T wave inversions  Relevant CV Studies:  L/RHC (prelim): Mild luminal irregularities in the LAD.  No significant CAD.  Normal left and right heart filling pressures.  Severely reduced LVEF with global hypokinesis.  Echocardiogram (05/04/2019): 1. The left ventricle has severely reduced systolic function, with an ejection fraction of 20-25%. The cavity size was moderately dilated. There is mildly increased left ventricular wall thickness. Left ventricular diastolic Doppler parameters are  consistent with impaired relaxation. 2. Aortic valve regurgitation is trivial by color flow Doppler. No stenosis of the aortic valve. 3. The interatrial septum was not  assessed.  Laboratory Data:  Chemistry Recent Labs  Lab 05/06/19 2212 05/07/19 0411  NA 137 138  K 3.6 3.5  CL 105 106  CO2 23 23  GLUCOSE 151* 160*  BUN 11 12  CREATININE 0.94 0.88  CALCIUM 8.5* 8.6*  GFRNONAA >60 >60  GFRAA >60 >60  ANIONGAP 9  9    No results for input(s): PROT, ALBUMIN, AST, ALT, ALKPHOS, BILITOT in the last 168 hours. Hematology Recent Labs  Lab 05/06/19 0428 05/07/19 0411  WBC 9.8 10.5  RBC 4.05* 4.54  HGB 12.1* 13.6  HCT 35.5* 39.5  MCV 87.7 87.0  MCH 29.9 30.0  MCHC 34.1 34.4  RDW 14.6 14.7  PLT 142* 165   Cardiac Enzymes Recent Labs  Lab 05/06/19 2212 05/07/19 0001 05/07/19 0555  TROPONINI 0.14* 0.15* 0.12*   No results for input(s): TROPIPOC in the last 168 hours.  BNP Recent Labs  Lab 05/04/19 0215  BNP 78.0    DDimer No results for input(s): DDIMER in the last 168 hours.  Radiology/Studies:  No results found.  Assessment and Plan:   Cardiac arrest and ventricular tachycardia -Currently denies chest pain, shortness of breath, and lightheadedness. Suspicion that cardiac arrest and VT most likely due to underlying cardiomyopathy. Telemetry since admission has been significant for normal sinus rhythm with frequent PVCs.  Yesterday at 5:39 PM, patient went into mono formic ventricular tachycardia with rate to 30 bpm.  Total episode lasted approximately 10 minutes before defibrillation was performed. EKG as above. Troponin 0.14  0.15  0.12 --05/07/2019 R/LHC with no significant CAD.   --Continue amiodarone infusion.  Could consider transition to oral amiodarone load if patient remains electrically stable. Continue carvedilol 12.5 mg daily, losartan 50 mg daily, and spironolactone 25 mg daily. - Maintain potassium with goal 4.0 and magnesium with goal 2.0. Daily BMET [facility] cardiologist reached out to EP team at [facility] for further recommendations with agreement for transfer to [facility] for formal EP consultation and  consideration of ICD placement.   Chronic systolic heart failure due to nonischemic cardiomyopathy -Patient currently euvolemic with normal left and right heart filling pressures.  Findings are not consistent with low output heart failure. -Maintain net even fluid balance.  Patient is net -2.4 L in the last 24 hours.  Weight has remained stable at 157lb. -Continue carvedilol 12.5 mg twice daily.  Could consider up titration as heart rate and blood pressure allow.  Continue losartan 50 mg daily with future consideration for  transition to [facility].  Started on spironolactone 25 mg daily.  Will need follow-up BMET and potassium with initiation of spironolactone.  Hypertension -Blood pressure suboptimal controlled.  SBP in 150s with DBP in low 100s. As above, add spironolactone and consider escalation of losartan versus transitioning to [place].  If patient is transition to [place], schedule follow-up labs at discharge.  Severity of Illness: The appropriate patient status for this patient is INPATIENT. Inpatient status is judged to be reasonable and necessary in order to provide the required intensity of service to ensure the patient's safety. The patient's presenting symptoms, physical exam findings, and initial radiographic and laboratory data in the context of their chronic comorbidities is felt to place them at high risk for further clinical deterioration. Furthermore, it is not anticipated that the patient will be medically stable for discharge from the hospital within 2 midnights of admission. The following factors support the patient status of inpatient.   " The patient's presenting symptoms include Cardiac arrest, VT. " The worrisome physical exam findings includeCardiac arrest, VT. " The initial radiographic and laboratory data are worrisome because of Cardiac arrest, VT"   The chronic co-morbidities include Cardiac arrest, VT.   * I certify that at the point of admission it is my clinical  judgment that the patient will require inpatient hospital  care spanning beyond 2 midnights from the point of admission due to high intensity of service, high risk for further deterioration and high frequency of surveillance required.*    For questions or updates, please contact [facility] Please consult [email or web address] for contact info under    Signed, Lennon Alstrom, PA-C  05/07/2019 3:16 PM    Addendum (05/07/19 @ 4:49 PM): I have independently seen and examined the patient and agree with the findings and plan, as documented in the PA/NP's note, with the following additions/changes.  Russell [name] is a 68 year old man with history nonischemic cardiomyopathy, hypertension, diabetes mellitus, admitted to [facility] after cardiac arrest with ventricular tachycardia.  He was successfully resuscitated, requiring 5 shocks.  He underwent hypothermia protocol and was successfully extubated 2 days ago.  Echocardiogram showed reduction of LVEF from 40% on last check to 20-25% during this admission.  He does not recall any prodromal symptoms, including palpitations and chest pain.  Last night, he had recurrent sustained ventricular tachycardia requiring defibrillation.  He was asymptomatic leading up to this event.  Cardiac catheterization today showed no significant coronary artery disease as well as normal left and right heart filling pressures.  He has been transferred to [facility] for EP consultation and possible ICD placement.  He remains on amiodarone infusion as well as evidence-based heart failure therapy.  Physical exam is notable for regular rate and rhythm with 2/6 systolic murmur.  No edema or JVD.  Lungs are clear.  We will continue current medications, including IV amiodarone, carvedilol, spironolactone, and losartan.  If possible, losartan can be transitioned to [place].  It would be reasonable to wean amlodipine to facilitate escalation of evidence-based heart failure therapy, based on renal  function.  We will keep Russell [name] n.p.o. after midnight pending EP evaluation for possible ICD placement.  Will defer long-term antiarrhythmic therapy to EP.  Continue sliding scale insulin for diabetes.  Yvonne Kendall, MD [facility] Pager: [phone]

## 2019-05-07 NOTE — Progress Notes (Signed)
Patient ID: Russell [name]., male   DOB: Russell [name], 68 y.o.   MRN: [id number]  [employer] PROGRESS NOTE  Russell [name]. MLY:[id number] DOB: Russell [name], Russell [name] DOA: 05/03/2019 PCP: Smitty Cords, DO  HPI/Subjective: Patient feels okay after cardiac catheterization.  No complaints of chest pain or shortness of breath.  Patient had sustained ventricular tachycardia yesterday afternoon requiring cardioversion.  Objective: Vitals:   05/07/19 1230 05/07/19 1300  BP: (!) 139/93   Pulse: 82 82  Resp: (!) 23 (!) [name]  Temp:    SpO2: 100% 97%    Filed Weights   05/06/19 0200 05/06/19 1246 05/07/19 1037  Weight: 69.9 kg 71.5 kg 71.5 kg    ROS: Review of Systems  Constitutional: Negative for chills and fever.  Eyes: Negative for blurred vision.  Respiratory: Negative for cough and shortness of breath.   Cardiovascular: Negative for chest pain.  Gastrointestinal: Negative for abdominal pain, constipation, diarrhea, nausea and vomiting.  Genitourinary: Negative for dysuria.  Musculoskeletal: Negative for joint pain.  Neurological: Negative for dizziness and headaches.   Exam: Physical Exam  HENT:  Nose: No mucosal edema.  Mouth/Throat: No oropharyngeal exudate or posterior oropharyngeal edema.  Eyes: Pupils are equal, round, and reactive to light. Conjunctivae, EOM and lids are normal.  Neck: No JVD present. Carotid bruit is not present. No edema present. No thyroid mass and no thyromegaly present.  Cardiovascular: S1 normal and S2 normal. Exam reveals no gallop.  No murmur heard. Pulses:      Dorsalis pedis pulses are 2+ on the right side and 2+ on the left side.  Respiratory: No respiratory distress. He has no wheezes. He has no rhonchi. He has no rales.  GI: Soft. Bowel sounds are normal. There is no abdominal tenderness.  Musculoskeletal:     Right ankle: He exhibits no swelling.     Left ankle: He exhibits no swelling.  Lymphadenopathy:    He has no cervical adenopathy.   Neurological: He is alert. No cranial nerve deficit.  Skin: Skin is warm. No rash noted. Nails show no clubbing.  Psychiatric: He has a normal mood and affect.      Data Reviewed: Basic Metabolic Panel: Recent Labs  Lab 05/04/19 0504 05/05/19 0443 05/06/19 0428 05/06/19 2212 05/07/19 0411  NA 139 143 139 137 138  K 4.2 3.1* 3.7 3.6 3.5  CL 108 110 104 105 106  CO2 24 23 24 23 23   GLUCOSE 177* 135* 157* 151* 160*  BUN 18 14 13 11 12   CREATININE 0.94 0.94 0.83 0.94 0.88  CALCIUM 8.4* 7.9* 8.0* 8.5* 8.6*  MG 2.2 2.0  --  1.9  --   PHOS 3.7 2.7  --   --   --    CBC: Recent Labs  Lab 05/03/19 2207 05/04/19 0504 05/05/19 0443 05/06/19 0428 05/07/19 0411  WBC 14.9* 13.2* 14.3* 9.8 10.5  NEUTROABS  --   --  12.3*  --   --   HGB 13.4 13.6 12.9* 12.1* 13.6  HCT 40.6 40.4 38.5* 35.5* 39.5  MCV 91.9 89.2 88.3 87.7 87.0  PLT 184 175 169 142* 165   Cardiac Enzymes: Recent Labs  Lab 05/04/19 1152 05/04/19 1738 05/06/19 2212 05/07/19 0001 05/07/19 0555  TROPONINI 0.68* 0.50* 0.14* 0.15* 0.12*   BNP (last 3 results) Recent Labs    05/04/19 0215  BNP 78.0     CBG: Recent Labs  Lab 05/06/19 1702 05/06/19 2134 05/07/19 0746 05/07/19 1031 05/07/19 1213  GLUCAP 123*  138* 154* 125* 139*    Recent Results (from the past 240 hour(s))  SARS Coronavirus 2 (CEPHEID - Performed in [facility] hospital lab), Hosp Order     Status: None   Collection Time: 05/03/19 10:07 PM  Result Value Ref Range Status   SARS Coronavirus 2 NEGATIVE NEGATIVE Final    Comment: (NOTE) If result is NEGATIVE SARS-CoV-2 target nucleic acids are NOT DETECTED. The SARS-CoV-2 RNA is generally detectable in upper and lower  respiratory specimens during the acute phase of infection. The lowest  concentration of SARS-CoV-2 viral copies this assay can detect is 250  copies / mL. A negative result does not preclude SARS-CoV-2 infection  and should not be used as the sole basis for treatment  or other  patient management decisions.  A negative result may occur with  improper specimen collection / handling, submission of specimen other  than nasopharyngeal swab, presence of viral mutation(s) within the  areas targeted by this assay, and inadequate number of viral copies  (<250 copies / mL). A negative result must be combined with clinical  observations, patient history, and epidemiological information. If result is POSITIVE SARS-CoV-2 target nucleic acids are DETECTED. The SARS-CoV-2 RNA is generally detectable in upper and lower  respiratory specimens dur ing the acute phase of infection.  Positive  results are indicative of active infection with SARS-CoV-2.  Clinical  correlation with patient history and other diagnostic information is  necessary to determine patient infection status.  Positive results do  not rule out bacterial infection or co-infection with other viruses. If result is PRESUMPTIVE POSTIVE SARS-CoV-2 nucleic acids MAY BE PRESENT.   A presumptive positive result was obtained on the submitted specimen  and confirmed on repeat testing.  While 2019 novel coronavirus  (SARS-CoV-2) nucleic acids may be present in the submitted sample  additional confirmatory testing may be necessary for epidemiological  and / or clinical management purposes  to differentiate between  SARS-CoV-2 and other Sarbecovirus currently known to infect humans.  If clinically indicated additional testing with an alternate test  methodology [phone]) is advised. The SARS-CoV-2 RNA is generally  detectable in upper and lower respiratory sp ecimens during the acute  phase of infection. The expected result is Negative. Fact Sheet for Patients:  BoilerBrush.com.cy Fact Sheet for Healthcare Providers: https://pope.com/ This test is not yet approved or cleared by the [place] FDA and has been authorized for detection and/or diagnosis of  SARS-CoV-2 by FDA under an Emergency Use Authorization (EUA).  This EUA will remain in effect (meaning this test can be used) for the duration of the COVID-19 declaration under Section 564(b)(1) of the Act, 21 U.S.C. section 360bbb-3(b)(1), unless the authorization is terminated or revoked sooner. Performed at [facility], [address] [place], [place] [address]   CULTURE, BLOOD (ROUTINE X 2) w Reflex to ID Panel     Status: None (Preliminary result)   Collection Time: 05/04/19  2:15 AM  Result Value Ref Range Status   Specimen Description BLOOD LEFT HAND  Final   Special Requests   Final    BOTTLES DRAWN AEROBIC AND ANAEROBIC Blood Culture results may not be optimal due to an inadequate volume of blood received in culture bottles   Culture   Final    NO GROWTH 3 DAYS Performed at [facility], [address], [place], [place] [address]    Report Status PENDING  Incomplete  MRSA PCR Screening     Status: Abnormal  Collection Time: 05/04/19  2:16 AM  Result Value Ref Range Status   MRSA by PCR POSITIVE (A) NEGATIVE Final    Comment:        The GeneXpert MRSA Assay (FDA approved for NASAL specimens only), is one component of a comprehensive MRSA colonization surveillance program. It is not intended to diagnose MRSA infection nor to guide or monitor treatment for MRSA infections. RESULT CALLED TO, READ BACK BY AND VERIFIED WITH: FELICIA PREUHOMME AT 0426 ON 05/04/2019 JJB Performed at [facility], [address] [place], [place] [address]   CULTURE, BLOOD (ROUTINE X 2) w Reflex to ID Panel     Status: None (Preliminary result)   Collection Time: 05/04/19  2:29 AM  Result Value Ref Range Status   Specimen Description BLOOD LEFT FOREARM  Final   Special Requests   Final    BOTTLES DRAWN AEROBIC AND ANAEROBIC Blood Culture results may not be optimal due to an inadequate volume of blood received in culture bottles   Culture   Final    NO  GROWTH 3 DAYS Performed at [facility], [address], [place], [place] [address]    Report Status PENDING  Incomplete  Culture, respiratory (non-expectorated)     Status: None   Collection Time: 05/04/19  8:04 AM  Result Value Ref Range Status   Specimen Description   Final    TRACHEAL ASPIRATE Performed at [facility], [address], [place], [place] [address]    Special Requests   Final    NONE Performed at [facility], [address] [place], [place] [address]    Gram Stain   Final    RARE WBC PRESENT, PREDOMINANTLY PMN FEW GRAM POSITIVE COCCI IN CHAINS RARE GRAM NEGATIVE RODS    Culture   Final    FEW Consistent with normal respiratory flora. Performed at [facility], [address] [place], [place] [address]    Report Status 05/07/2019 FINAL  Final      Scheduled Meds: . amLODipine  10 mg Oral Daily  . carvedilol  12.5 mg Oral BID  . Chlorhexidine Gluconate Cloth  6 each Topical Q0600  . [START ON 05/08/2019] enoxaparin (LOVENOX) injection  40 mg Subcutaneous Q24H  . insulin aspart  0-5 Units Subcutaneous QHS  . insulin aspart  0-9 Units Subcutaneous TID WC  . latanoprost  1 drop Both Eyes QHS  . losartan  50 mg Oral Daily  . mupirocin ointment  1 application Nasal BID  . polyvinyl alcohol  2 drop Both Eyes Q8H  . sodium chloride flush  3 mL Intravenous Q12H  . spironolactone  25 mg Oral Daily  . timolol  1 drop Both Eyes Daily   Continuous Infusions: . sodium chloride    . amiodarone 30 mg/hr (05/07/19 0800)    Assessment/Plan:  1. Ventricular tachycardia acute cardiac arrest.  Cardiac catheterization negative today.  Dr. Okey Dupre to contact [facility], EP physicians for defibrillator placement. Likely will end up needing transfer to [facility].  Patient currently on amiodarone drip. 2. Chronic systolic congestive heart failure.  On Coreg and losartan and spironolactone. 3. Lactic acidosis secondary to cardiac  arrest 4. Type 2 diabetes mellitus with hyperglycemia.  On sliding scale insulin 5. Hypertension on Coreg and losartan  Code Status:  Code Status History    Date Active Date Inactive Code Status Order ID Comments User Context   05/04/2019 0723 05/06/2019 0954 Full Code [id number]  Tukov-Yual,  Alroy BailiffMagdalene S, NP Inpatient   05/03/2019 2352 05/04/2019 0723 Full Code [id number]  Mansy, Vernetta HoneyJan A, MD ED     Family Communication: Spoke with daughter on phone Disposition Plan: transfer to [place] at some point  Consultants:  Cardiology   Critical care specialist  Time spent: 32 minutes  Andri Prestia [employer]  [employer]

## 2019-05-07 NOTE — Progress Notes (Signed)
  PULMONARY / CRITICAL CARE MEDICINE  Name: Russell [name]. MRN: [id number] DOB: May 01, 1951    LOS: 4  Referring Provider:  Dr Arville Care Reason for Referral:  Cardiac arrest and targeted temperature management Brief patient description:  68 y/o male admitted following a VF witnessed arrest while playing baseball.   SUBJ:  Transferred to telemetry floor yesterday.  Shortly thereafter developed V. tach with a pulse.  Now back in sinus rhythm on amiodarone infusion.  Left heart cath performed today with no significant obstructive coronary disease.  Severely impaired LV function confirmed on cath.  VITAL SIGNS: BP (!) 144/99   Pulse 84   Temp 98.1 F (36.7 C) (Oral)   Resp (!) 25   Ht 5\' 8"  (1.727 m)   Wt 71.5 kg   SpO2 98%   BMI 23.97 kg/m   HEMODYNAMICS:    VENTILATOR SETTINGS:    INTAKE / OUTPUT: I/O last 3 completed shifts: In: 1129.2 [I.V.:1129.2] Out: 3050 [Urine:3050]  PHYSICAL EXAMINATION: Gen: NAD HEENT: NCAT, sclerae white Neck: No JVD Lungs: breath sounds full, no wheezes or other adventitious sounds Cardiovascular: RRR, no murmurs Abdomen: Soft, nontender, normal BS Ext: without clubbing, cyanosis, edema Neuro: grossly intact Skin: Limited exam, no lesions noted     LABS:  BMET Recent Labs  Lab 05/06/19 0428 05/06/19 2212 05/07/19 0411  NA 139 137 138  K 3.7 3.6 3.5  CL 104 105 106  CO2 24 23 23   BUN 13 11 12   CREATININE 0.83 0.94 0.88  GLUCOSE 157* 151* 160*    Electrolytes Recent Labs  Lab 05/04/19 0504 05/05/19 0443 05/06/19 0428 05/06/19 2212 05/07/19 0411  CALCIUM 8.4* 7.9* 8.0* 8.5* 8.6*  MG 2.2 2.0  --  1.9  --   PHOS 3.7 2.7  --   --   --     CBC Recent Labs  Lab 05/05/19 0443 05/06/19 0428 05/07/19 0411  WBC 14.3* 9.8 10.5  HGB 12.9* 12.1* 13.6  HCT 38.5* 35.5* 39.5  PLT 169 142* 165    Coag's Recent Labs  Lab 05/04/19 0215 05/04/19 0808  APTT 84* 144*  INR 1.1 1.1    Sepsis Markers Recent Labs  Lab  05/03/19 2207 05/04/19 0808 05/05/19 0443 05/06/19 0428  LATICACIDVEN 7.6* 1.2  --   --   PROCALCITON  --  1.78 1.66 1.42    ABG Recent Labs  Lab 05/03/19 2245 05/04/19 0500 05/05/19 0500  PHART 7.32* 7.40 7.33*  PCO2ART 38 45 42  PO2ART 87 121* 85    Liver Enzymes No results for input(s): AST, ALT, ALKPHOS, BILITOT, ALBUMIN in the last 168 hours.  Cardiac Enzymes Recent Labs  Lab 05/06/19 2212 05/07/19 0001 05/07/19 0555  TROPONINI 0.14* 0.15* 0.12*    Glucose Recent Labs  Lab 05/06/19 0750 05/06/19 1141 05/06/19 1702 05/06/19 2134 05/07/19 0746 05/07/19 1031  GLUCAP 182* 130* 123* 138* 154* 125*    CXR: No new film   ASSESSMENT / PLAN: VDRF, resolved OOH VT/VF arrest Dilated cardiomyopathy Recurrent VT Sinus bradycardia, resolved Cardiogenic shock, resolved Severe hypertension, controlled Mildly elevated PCT without overt infection DM2, controlled Hypoxic-ischemic encephalopathy.  Markedly improved P:   Continue amiodarone Further management of ventricular arrhythmias per cardiology Might require transfer to [facility] for AICD    Billy Fischer, MD PCCM service Mobile [phone] Pager [phone] 05/07/2019 12:02 PM

## 2019-05-07 NOTE — Consult Note (Signed)
Cardiology Consultation:   Patient ID: Russell [name]. MRN: [id number]; DOB: March 13, 1951  Admit date: 05/07/2019 Date of Consult: 05/07/2019  Primary Care Provider: Smitty Cords, DO Primary Cardiologist: No primary care provider on file.  Primary Electrophysiologist:  None    Patient Profile:   Russell [name]. is a 68 y.o. male with a hx of chronic systolic heart failure due to nonischemic cardiomyopathy, hypertension, frequent PVCs who is being seen today for the evaluation of cardiac arrest, VT at the request of Thayer Ohm and.  History of Present Illness:   Mr. [name] is a 68 year old male with a history of chronic systolic heart failure.  He presented to [facility] after developing chest pain and collapse.  He received bystander CPR.  Upon EMS arrival he was in ventricular fibrillation and received 5 shocks with return of circulation.  On arrival to [facility] he did not have purposeful movements.  He underwent cooling protocol.  He was successfully extubated, but had a VT event 05/06/2019 with successful defibrillation and return to baseline mental status.  He was started on amiodarone and remained in sinus rhythm.  He underwent left heart catheterization which showed mild luminal irregularities.  He was transferred to [facility] for evaluation for ICD therapy.  Past Medical History:  Diagnosis Date   CAD (coronary artery disease)    05/07/2019 LHC showed nonobstructive CAD   Diabetes (HCC)    Frequent PVCs    Heart failure with reduced ejection fraction (HCC)    04/2019 EF 20-25%   Hypertension    Impotence of organic origin    NICM (nonischemic cardiomyopathy) (HCC)    HFrEF in setting of NICM   Pain in joint, shoulder region    Personal history of colonic polyps    Ventricular tachycardia (HCC)    04/2019 in setting of cardiac arrest    Past Surgical History:  Procedure Laterality Date   CARDIAC CATHETERIZATION  11/17/2013   COLONOSCOPY  2009   RIGHT/LEFT  HEART CATH AND CORONARY ANGIOGRAPHY N/A 05/07/2019   Procedure: RIGHT/LEFT HEART CATH AND CORONARY ANGIOGRAPHY;  Surgeon: Yvonne Kendall, MD;  Location: [facility] INVASIVE CV LAB;  Service: Cardiovascular;  Laterality: N/A;     Home Medications:  Prior to Admission medications   Medication Sig Start Date End Date Taking? Authorizing Provider  Acetaminophen (TYLENOL PO) Take 1,000 mg by mouth every 4 (four) hours as needed (mild pain).     [provider]  amiodarone (NEXTERONE PREMIX) 360-4.14 MG/200ML-% SOLN Inject 30 mg/hr into the vein continuous. 05/07/19   Alford Highland, MD  amLODipine (NORVASC) 10 MG tablet Take 1 tablet (10 mg total) by mouth daily. 06/24/18   Karamalegos, Netta Neat, DO  carvedilol (COREG) 12.5 MG tablet Take 1 tablet (12.5 mg total) by mouth 2 (two) times daily. 05/07/19   Alford Highland, MD  cetirizine (ZYRTEC) 10 MG tablet Take 10 mg by mouth daily. 04/13/17   [provider]  Cholecalciferol (VITAMIN D3) 2000 units TABS Take by mouth.    [provider]  dorzolamide-timolol (COSOPT) 22.3-6.8 MG/ML ophthalmic solution Place 1 drop into both eyes 2 (two) times daily.    [provider]  latanoprost (XALATAN) 0.005 % ophthalmic solution Place 1 drop into both eyes at bedtime.  05/30/18   [provider]  losartan (COZAAR) 50 MG tablet Take 1 tablet (50 mg total) by mouth daily. 05/08/19   Alford Highland, MD  mupirocin ointment (BACTROBAN) 2 % Place 1 application into the nose 2 (two)  times daily. 05/07/19   Alford Highland, MD  polyvinyl alcohol (LIQUIFILM TEARS) 1.4 % ophthalmic solution Place 2 drops into both eyes every 8 (eight) hours. 05/07/19   Alford Highland, MD  spironolactone (ALDACTONE) 25 MG tablet Take 1 tablet (25 mg total) by mouth daily. 05/08/19   Alford Highland, MD  timolol (TIMOPTIC) 0.5 % ophthalmic solution Place 1 drop into both eyes 2 (two) times daily.  06/18/18   [provider]    Inpatient  Medications: Scheduled Meds:  [START ON 05/08/2019] amLODipine  10 mg Oral Daily   [START ON 05/08/2019] aspirin EC  81 mg Oral Daily   carvedilol  12.5 mg Oral BID WC   insulin aspart  0-15 Units Subcutaneous TID WC   insulin aspart  0-5 Units Subcutaneous QHS   latanoprost  1 drop Both Eyes QHS   [START ON 05/08/2019] losartan  25 mg Oral Daily   mupirocin ointment   Nasal BID   polyvinyl alcohol  2 drop Both Eyes Q8H   sodium chloride flush  3 mL Intravenous Q12H   [START ON 05/08/2019] spironolactone  25 mg Oral Daily   [START ON 05/08/2019] timolol  1 drop Both Eyes Daily   Continuous Infusions:  sodium chloride     amiodarone     amiodarone     PRN Meds: sodium chloride, acetaminophen, hydrALAZINE, nitroGLYCERIN, ondansetron (ZOFRAN) IV, sodium chloride flush  Allergies:   No Known Allergies  Social History:   Social History   Socioeconomic History   Marital status: Married    Spouse name: Not on file   Number of children: Not on file   Years of education: Not on file   Highest education level: Not on file  Occupational History   Occupation: Lexicographer  Social Needs   Financial resource strain: Not on file   Food insecurity:    Worry: Not on file    Inability: Not on file   Transportation needs:    Medical: Not on file    Non-medical: Not on file  Tobacco Use   Smoking status: Never Smoker   Smokeless tobacco: Never Used  Substance and Sexual Activity   Alcohol use: Yes    Comment: occassional   Drug use: No   Sexual activity: Not on file  Lifestyle   Physical activity:    Days per week: Not on file    Minutes per session: Not on file   Stress: Not on file  Relationships   Social connections:    Talks on phone: Not on file    Gets together: Not on file    Attends religious service: Not on file    Active member of club or organization: Not on file    Attends meetings of clubs or organizations: Not on file     Relationship status: Not on file   Intimate partner violence:    Fear of current or ex partner: Not on file    Emotionally abused: Not on file    Physically abused: Not on file    Forced sexual activity: Not on file  Other Topics Concern   Not on file  Social History Narrative   Not on file    Family History:    Family History  Problem Relation Age of Onset   Colon cancer Father        1970's   Hypertension Mother    Diabetes Mother      ROS:  Please see the history of present  illness.   All other ROS reviewed and negative.     Physical Exam/Data:  There were no vitals filed for this visit. No intake or output data in the 24 hours ending 05/07/19 1654 Last 3 Weights 05/07/2019 05/06/2019 05/06/2019  Weight (lbs) 157 lb 10.1 oz 157 lb 11.2 oz 154 lb 1.6 oz  Weight (kg) 71.5 kg 71.532 kg 69.9 kg     There is no height or weight on file to calculate BMI.  General:  Well nourished, well developed, in no acute distress HEENT: normal Lymph: no adenopathy Neck: no JVD Endocrine:  No thryomegaly Vascular: No carotid bruits; FA pulses 2+ bilaterally without bruits  Cardiac:  normal S1, S2; RRR; no murmur  Lungs:  clear to auscultation bilaterally, no wheezing, rhonchi or rales  Abd: soft, nontender, no hepatomegaly  Ext: no edema Musculoskeletal:  No deformities, BUE and BLE strength normal and equal Skin: warm and dry  Neuro:  CNs 2-12 intact, no focal abnormalities noted Psych:  Normal affect   EKG:  The EKG was personally reviewed and demonstrates: Sinus rhythm, PVCs Telemetry:  Telemetry was personally reviewed and demonstrates: Sinus rhythm, PVCs  Relevant CV Studies: LHC 1. Mild luminal irregularities in the LAD. No angiographically significant coronary artery disease. 2. Normal left and right heart filling pressures. 3. Severely reduced left ventricular systolic function (LVEF 25-30%).  TTE  1. The left ventricle has severely reduced systolic function,  with an ejection fraction of 20-25%. The cavity size was moderately dilated. There is mildly increased left ventricular wall thickness. Left ventricular diastolic Doppler parameters are  consistent with impaired relaxation.  2. Aortic valve regurgitation is trivial by color flow Doppler. No stenosis of the aortic valve.  3. The interatrial septum was not assessed.  Laboratory Data:  Chemistry Recent Labs  Lab 05/06/19 0428 05/06/19 2212 05/07/19 0411  NA 139 137 138  K 3.7 3.6 3.5  CL 104 105 106  CO2 24 23 23   GLUCOSE 157* 151* 160*  BUN 13 11 12   CREATININE 0.83 0.94 0.88  CALCIUM 8.0* 8.5* 8.6*  GFRNONAA >60 >60 >60  GFRAA >60 >60 >60  ANIONGAP 11 9 9     No results for input(s): PROT, ALBUMIN, AST, ALT, ALKPHOS, BILITOT in the last 168 hours. Hematology Recent Labs  Lab 05/05/19 0443 05/06/19 0428 05/07/19 0411  WBC 14.3* 9.8 10.5  RBC 4.36 4.05* 4.54  HGB 12.9* 12.1* 13.6  HCT 38.5* 35.5* 39.5  MCV 88.3 87.7 87.0  MCH 29.6 29.9 30.0  MCHC 33.5 34.1 34.4  RDW 14.2 14.6 14.7  PLT 169 142* 165   Cardiac Enzymes Recent Labs  Lab 05/04/19 0504 05/04/19 1152 05/04/19 1738 05/06/19 2212 05/07/19 0001 05/07/19 0555  TROPONINI 0.66* 0.68* 0.50* 0.14* 0.15* 0.12*   No results for input(s): TROPIPOC in the last 168 hours.  BNP Recent Labs  Lab 05/04/19 0215  BNP 78.0    DDimer No results for input(s): DDIMER in the last 168 hours.  Radiology/Studies:  Dg Abd 1 View  Result Date: 05/04/2019 CLINICAL DATA:  NG tube placement EXAM: ABDOMEN - 1 VIEW COMPARISON:  Radiograph 05/03/2019 FINDINGS: Nasogastric tube extends the stomach. Side port below the GE junction. Normal bowel-gas pattern. Lung bases clear. IMPRESSION: NG tube in proper position. Electronically Signed   By: Genevive Bi M.D.   On: 05/04/2019 05:33   Ct Head Wo Contrast  Result Date: 05/03/2019 CLINICAL DATA:  68 y/o M; altered level of consciousness. Witnessed cardiac arrest, down  approximately 4-6 minutes, CPR. EXAM: CT HEAD WITHOUT CONTRAST TECHNIQUE: Contiguous axial images were obtained from the base of the skull through the vertex without intravenous contrast. COMPARISON:  None. FINDINGS: Brain: No evidence of acute infarction, hemorrhage, hydrocephalus, extra-axial collection or mass lesion/mass effect. Few nonspecific white matter hypodensities are compatible with mild chronic microvascular ischemic changes and there is mild volume loss of the brain. Vascular: Calcific atherosclerosis of internal carotid arteries. No hyperdense vessel identified. Skull: Normal. Negative for fracture or focal lesion. Sinuses/Orbits: No acute finding. Other: None. IMPRESSION: 1. No acute intracranial abnormality identified. 2. Mild chronic microvascular ischemic changes and volume loss of the brain. Electronically Signed   By: Mitzi Hansen M.D.   On: 05/03/2019 23:38   Dg Chest Port 1 View  Result Date: 05/05/2019 CLINICAL DATA:  Acute respiratory failure. EXAM: PORTABLE CHEST 1 VIEW COMPARISON:  Radiographs of May 03, 2019. FINDINGS: Stable cardiomediastinal silhouette. Endotracheal and nasogastric tubes are unchanged in position. No pneumothorax or pleural effusion is noted. No acute pulmonary disease is noted. Bony thorax is unremarkable. IMPRESSION: Stable support apparatus. No acute cardiopulmonary abnormality seen. Electronically Signed   By: Lupita Raider M.D.   On: 05/05/2019 07:29   Dg Chest Port 1 View  Result Date: 05/03/2019 CLINICAL DATA:  68 y/o  M; cardiac arrest and intubation. EXAM: PORTABLE CHEST 1 VIEW COMPARISON:  11/13/2013 chest radiograph. FINDINGS: Stable cardiac silhouette given projection and technique. Endotracheal tube tip projects 4.1 cm above the carina. Enteric tube tip extends below the field of view into the abdomen. Transcutaneous pacing pads. Diffuse pulmonary vascular congestion. No focal consolidation. No pleural effusion or pneumothorax. No acute  osseous abnormality is evident. IMPRESSION: Endotracheal tube tip projects 4.1 cm above the carina. Enteric tube tip extends below the field of view into the abdomen. Diffuse pulmonary vascular congestion. Electronically Signed   By: Mitzi Hansen M.D.   On: 05/03/2019 22:47   Ct Angio Chest/abd/pel For Dissection W And/or W/wo  Result Date: 05/04/2019 CLINICAL DATA:  68 year old male with diabetes and vascular disease. Evaluate for aortic dissection or pulmonary embolism. EXAM: CT ANGIOGRAPHY CHEST, ABDOMEN AND PELVIS TECHNIQUE: Multidetector CT imaging through the chest, abdomen and pelvis was performed using the standard protocol during bolus administration of intravenous contrast. Multiplanar reconstructed images and MIPs were obtained and reviewed to evaluate the vascular anatomy. CONTRAST:  ISOVUE-370 IOPAMIDOL (ISOVUE-370) INJECTION 76% COMPARISON:  Chest radiograph dated 05/03/2019 FINDINGS: Evaluation is limited due to streak artifact caused by patient's arms. CTA CHEST FINDINGS Cardiovascular: There is moderate cardiomegaly. No pericardial effusion. The thoracic aorta is unremarkable. The visualized origins of the great vessels of the aortic arch appear patent. The central pulmonary arteries are patent. Evaluation of the distal pulmonary arteries is limited due to suboptimal opacification. No large or central pulmonary artery embolus identified. Mediastinum/Nodes: No hilar or mediastinal adenopathy. An enteric tube is noted within the esophagus. The thyroid gland is grossly unremarkable. No mediastinal fluid collection. Lungs/Pleura: Endotracheal tube with tip approximately 2 cm above the carina. Patchy bilateral lower lobe consolidative changes may represent atelectasis versus infiltrate. There is no pleural effusion or pneumothorax. The central airways are patent. Musculoskeletal: Minimally displaced fracture of the anterior right fourth rib. Age indeterminate, likely acute,  nondisplaced and incomplete fracture of the sternal body. Review of the MIP images confirms the above findings. CTA ABDOMEN AND PELVIS FINDINGS VASCULAR Aorta: Normal caliber aorta without aneurysm, dissection, vasculitis or significant stenosis. Celiac: Patent without evidence of aneurysm, dissection, vasculitis  or significant stenosis. SMA: Patent without evidence of aneurysm, dissection, vasculitis or significant stenosis. Renals: Both renal arteries are patent without evidence of aneurysm, dissection, vasculitis, fibromuscular dysplasia or significant stenosis. IMA: Patent without evidence of aneurysm, dissection, vasculitis or significant stenosis. Inflow: Patent without evidence of aneurysm, dissection, vasculitis or significant stenosis. Veins: No obvious venous abnormality within the limitations of this arterial phase study. Air within the left common femoral vein, likely iatrogenic and related to intravenous access. Review of the MIP images confirms the above findings. NON-VASCULAR No intra-abdominal free air or free fluid. Hepatobiliary: The liver is grossly unremarkable. The gallbladder is partially contracted. No calcified gallstone. Thickened appearance of the gallbladder wall, likely partially related to underdistention. Pancreas: Unremarkable. No pancreatic ductal dilatation or surrounding inflammatory changes. Spleen: Normal in size without focal abnormality. Adrenals/Urinary Tract: Adrenal glands are unremarkable. Kidneys are normal, without renal calculi, focal lesion, or hydronephrosis. The urinary bladder is decompressed around a Foley catheter. Stomach/Bowel: An enteric tube is noted with tip in the proximal stomach. There is no bowel obstruction. The appendix is unremarkable as visualized. Lymphatic: No adenopathy. Reproductive: The prostate and seminal vesicles are grossly unremarkable. Other: Small fat containing umbilical hernia. Musculoskeletal: No acute or significant osseous findings.  Review of the MIP images confirms the above findings. IMPRESSION: 1. No aortic aneurysm or dissection. No CT evidence of central pulmonary artery embolus. 2. Minimally displaced fracture of the anterior right fourth rib and nondisplaced fracture of the sternal body. 3. Moderate cardiomegaly. 4. Bilateral lower lobe consolidative changes may represent atelectasis versus infiltrate. 5. No bowel obstruction or active inflammation. Electronically Signed   By: Elgie Collard M.D.   On: 05/04/2019 00:56    Assessment and Plan:   1. Cardiac arrest due to likely ventricular fibrillation: Patient does have a history of systolic heart failure.  Left heart catheterization was with mild luminal irregularities.  It is likely that he had his ventricular arrhythmia due to his systolic heart failure.  He is currently on amiodarone as he did have a subsequent VT event.  At this point, he would benefit from ICD therapy.  The patient has agreed to the procedure.  Lennell Shanks discuss with Dr. Johney Frame for tomorrow. 2. Chronic systolic heart failure due to nonischemic cardiomyopathy: On optimal medical therapy.  He does have amlodipine which we Chanin Frumkin stop.  We Therman Hughlett also stop losartan and start him on Entresto. 3. Pretension: Blood pressure is in the 150s with diastolics in the 100s.  When to stop amlodipine and start Entresto.   For questions or updates, please contact [facility] Please consult [email or web address] for contact info under     Signed, Gioia Ranes Jorja Loa, MD  05/07/2019 4:54 PM

## 2019-05-07 NOTE — Interval H&P Note (Signed)
History and Physical Interval Note:  05/07/2019 10:36 AM  [name]  has presented today for cardiac catheterization, with the diagnosis of cardiac arrest and recurrent VT.  The various methods of treatment have been discussed with the patient and family. After consideration of risks, benefits and other options for treatment, the patient has consented to  Procedure(s): RIGHT/LEFT HEART CATH AND CORONARY ANGIOGRAPHY (N/A) as a surgical intervention.  The patient's history has been reviewed, patient examined, no change in status, stable for surgery.  It should be noted that the patient had recurrent sustained monomorphic VT yesterday evening that was successfully treated with defibrillation. I have reviewed the patient's chart and labs.  Questions were answered to the patient's satisfaction.    Cath Lab Visit (complete for each Cath Lab visit)  Clinical Evaluation Leading to the Procedure:   ACS: Yes.  (recurrent sustained VT)  Non-ACS:  N/A  Russell [name]

## 2019-05-07 NOTE — Progress Notes (Signed)
PT Cancellation Note  Patient Details Name: Russell [name]. MRN: [id number] DOB: 1951/10/04   Cancelled Treatment:    Reason Eval/Treat Not Completed: Other (comment)(Code blue called yesterday. Patient transferred to ICU. New PT order required due to change in status. )  Precious Bard, PT, DPT   05/07/2019, 8:31 AM

## 2019-05-07 NOTE — Progress Notes (Signed)
LaDonna,RN made aware of the discontinue CVC order and will remove.  Penni Bombard Sharlene Mccluskey,RN-VAST

## 2019-05-07 NOTE — Discharge Summary (Signed)
[employer] [facility] [place] [facility]   PATIENT NAME: Russell [name]    MR#:  [id number]  DATE OF BIRTH:  07/17/1951  DATE OF ADMISSION:  05/03/2019 ADMITTING PHYSICIAN: Hannah Beat, MD  DATE OF Transfer to [facility]:  05/07/2019  PRIMARY CARE PHYSICIAN: Smitty Cords, DO    ADMISSION DIAGNOSIS:  Lactic acidosis [E87.2] Cardiopulmonary arrest with successful resuscitation (HCC) [I46.9]  DISCHARGE DIAGNOSIS:  Active Problems:   Cardiac arrest (HCC)   Ventricular tachycardia (HCC)   Chronic systolic heart failure (HCC)   SECONDARY DIAGNOSIS:   Past Medical History:  Diagnosis Date  . Impotence of organic origin   . Pain in joint, shoulder region   . Personal history of colonic polyps     HOSPITAL COURSE:   1.  Acute cardiac arrest on presentation.  The patient survived the event, went through cooling and rewarming and patient was extubated.  They actually transferred him out to the floor yesterday evening.  He had sustained ventricular tachycardia and required cardioversion and was transferred back to the ICU.  He was brought to the cardiac Cath Lab today which showed minimal irregularities.  Dr. Okey Dupre contacted the cardiology team at [facility] for potential ICD placement.  The patient is currently on amiodarone drip. 2.  Chronic systolic congestive heart failure.  Patient on Coreg, losartan and spironolactone.  Echocardiogram showed an EF of 20 to 25%.  This is a nonischemic dilated cardiomyopathy. 3.  Lactic acidosis secondary to cardiac arrest. 4.  Type 2 diabetes mellitus with hyperglycemia.  He is on sliding scale insulin here.  Last hemoglobin A1c 7.4. 5.  Hypertension on Coreg and losartan  DISCHARGE CONDITIONS:   Fair  CONSULTS OBTAINED:  Treatment Team:  Iran Ouch, MD Thana Farr, MD  DRUG ALLERGIES:  No Known Allergies  DISCHARGE MEDICATIONS:   Allergies as of 05/07/2019   No Known Allergies     Medication List    STOP  taking these medications   aspirin EC 81 MG tablet   lisinopril 40 MG tablet Commonly known as:  ZESTRIL   metFORMIN 500 MG 24 hr tablet Commonly known as:  GLUCOPHAGE-XR   sildenafil 20 MG tablet Commonly known as:  REVATIO     TAKE these medications   amiodarone 360-4.14 MG/200ML-% Soln Commonly known as:  NEXTERONE PREMIX Inject 30 mg/hr into the vein continuous.   amLODipine 10 MG tablet Commonly known as:  NORVASC Take 1 tablet (10 mg total) by mouth daily.   carvedilol 12.5 MG tablet Commonly known as:  COREG Take 1 tablet (12.5 mg total) by mouth 2 (two) times daily. What changed:    medication strength  how much to take   cetirizine 10 MG tablet Commonly known as:  ZYRTEC Take 10 mg by mouth daily.   dorzolamide-timolol 22.3-6.8 MG/ML ophthalmic solution Commonly known as:  COSOPT Place 1 drop into both eyes 2 (two) times daily.   latanoprost 0.005 % ophthalmic solution Commonly known as:  XALATAN Place 1 drop into both eyes at bedtime.   losartan 50 MG tablet Commonly known as:  COZAAR Take 1 tablet (50 mg total) by mouth daily. Start taking on:  May 08, 2019   mupirocin ointment 2 % Commonly known as:  BACTROBAN Place 1 application into the nose 2 (two) times daily.   polyvinyl alcohol 1.4 % ophthalmic solution Commonly known as:  LIQUIFILM TEARS Place 2 drops into both eyes every 8 (eight) hours.   spironolactone 25  MG tablet Commonly known as:  ALDACTONE Take 1 tablet (25 mg total) by mouth daily. Start taking on:  May 08, 2019   timolol 0.5 % ophthalmic solution Commonly known as:  TIMOPTIC Place 1 drop into both eyes 2 (two) times daily.   TYLENOL PO Take 1,000 mg by mouth every 4 (four) hours as needed (mild pain).   Vitamin D3 50 MCG (2000 UT) Tabs Take by mouth.        DISCHARGE INSTRUCTIONS:   [facility] for consideration of defibrillator placement  If you experience worsening of your  admission symptoms, develop shortness of breath, life threatening emergency, suicidal or homicidal thoughts you must seek medical attention immediately by calling 911 or calling your MD immediately  if symptoms less severe.  You Must read complete instructions/literature along with all the possible adverse reactions/side effects for all the Medicines you take and that have been prescribed to you. Take any new Medicines after you have completely understood and accept all the possible adverse reactions/side effects.   Please note  You were cared for by a hospitalist during your hospital stay. If you have any questions about your discharge medications or the care you received while you were in the hospital after you are discharged, you can call the unit and asked to speak with the hospitalist on call if the hospitalist that took care of you is not available. Once you are discharged, your primary care physician will handle any further medical issues. Please note that NO REFILLS for any discharge medications will be authorized once you are discharged, as it is imperative that you return to your primary care physician (or establish a relationship with a primary care physician if you do not have one) for your aftercare needs so that they can reassess your need for medications and monitor your lab values.    Today   CHIEF COMPLAINT:   Chief Complaint  Patient presents with  . Cardiac Arrest    HISTORY OF PRESENT ILLNESS:  Russell [name]  is a 68 y.o. male with a known history of cardiomyopathy presented with a cardiac arrest   VITAL SIGNS:  Blood pressure (!) 139/93, pulse 82, temperature 98.1 F (36.7 C), temperature source Oral, resp. rate (!) 27, height 5\' 8"  (1.727 m), weight 71.5 kg, SpO2 97 %.    PHYSICAL EXAMINATION:  GENERAL:  68 y.o.-year-old patient lying in the bed with no acute distress.  EYES: Pupils equal, round, reactive to light and accommodation. No scleral icterus. Extraocular  muscles intact.  HEENT: Head atraumatic, normocephalic. Oropharynx and nasopharynx clear.  NECK:  Supple, no jugular venous distention. No thyroid enlargement, no tenderness.  LUNGS: Normal breath sounds bilaterally, no wheezing, rales,rhonchi or crepitation. No use of accessory muscles of respiration.  CARDIOVASCULAR: S1, S2 normal. No murmurs, rubs, or gallops.  ABDOMEN: Soft, non-tender, non-distended. Bowel sounds present. No organomegaly or mass.  EXTREMITIES: No pedal edema, cyanosis, or clubbing.  NEUROLOGIC: Cranial nerves II through XII are intact. Muscle strength 5/5 in all extremities. Sensation intact. Gait not checked.  PSYCHIATRIC: The patient is alert and oriented x 3.  SKIN: No obvious rash, lesion, or ulcer.   DATA REVIEW:   CBC Recent Labs  Lab 05/07/19 0411  WBC 10.5  HGB 13.6  HCT 39.5  PLT 165    Chemistries  Recent Labs  Lab 05/06/19 2212 05/07/19 0411  NA 137 138  K 3.6 3.5  CL 105 106  CO2 23 23  GLUCOSE 151* 160*  BUN 11 12  CREATININE 0.94 0.88  CALCIUM 8.5* 8.6*  MG 1.9  --     Cardiac Enzymes Recent Labs  Lab 05/07/19 0555  TROPONINI 0.12*    Microbiology Results  Results for orders placed or performed during the hospital encounter of 05/03/19  SARS Coronavirus 2 (CEPHEID - Performed in [facility] hospital lab), Hosp Order     Status: None   Collection Time: 05/03/19 10:07 PM  Result Value Ref Range Status   SARS Coronavirus 2 NEGATIVE NEGATIVE Final    Comment: (NOTE) If result is NEGATIVE SARS-CoV-2 target nucleic acids are NOT DETECTED. The SARS-CoV-2 RNA is generally detectable in upper and lower  respiratory specimens during the acute phase of infection. The lowest  concentration of SARS-CoV-2 viral copies this assay can detect is 250  copies / mL. A negative result does not preclude SARS-CoV-2 infection  and should not be used as the sole basis for treatment or other  patient management decisions.  A negative result may  occur with  improper specimen collection / handling, submission of specimen other  than nasopharyngeal swab, presence of viral mutation(s) within the  areas targeted by this assay, and inadequate number of viral copies  (<250 copies / mL). A negative result must be combined with clinical  observations, patient history, and epidemiological information. If result is POSITIVE SARS-CoV-2 target nucleic acids are DETECTED. The SARS-CoV-2 RNA is generally detectable in upper and lower  respiratory specimens dur ing the acute phase of infection.  Positive  results are indicative of active infection with SARS-CoV-2.  Clinical  correlation with patient history and other diagnostic information is  necessary to determine patient infection status.  Positive results do  not rule out bacterial infection or co-infection with other viruses. If result is PRESUMPTIVE POSTIVE SARS-CoV-2 nucleic acids MAY BE PRESENT.   A presumptive positive result was obtained on the submitted specimen  and confirmed on repeat testing.  While 2019 novel coronavirus  (SARS-CoV-2) nucleic acids may be present in the submitted sample  additional confirmatory testing may be necessary for epidemiological  and / or clinical management purposes  to differentiate between  SARS-CoV-2 and other Sarbecovirus currently known to infect humans.  If clinically indicated additional testing with an alternate test  methodology [phone]) is advised. The SARS-CoV-2 RNA is generally  detectable in upper and lower respiratory sp ecimens during the acute  phase of infection. The expected result is Negative. Fact Sheet for Patients:  BoilerBrush.com.cy Fact Sheet for Healthcare Providers: https://pope.com/ This test is not yet approved or cleared by the [place] FDA and has been authorized for detection and/or diagnosis of SARS-CoV-2 by FDA under an Emergency Use Authorization (EUA).  This  EUA will remain in effect (meaning this test can be used) for the duration of the COVID-19 declaration under Section 564(b)(1) of the Act, 21 U.S.C. section 360bbb-3(b)(1), unless the authorization is terminated or revoked sooner. Performed at [facility], [address] [place], [place] [address]   CULTURE, BLOOD (ROUTINE X 2) w Reflex to ID Panel     Status: None (Preliminary result)   Collection Time: 05/04/19  2:15 AM  Result Value Ref Range Status   Specimen Description BLOOD LEFT HAND  Final   Special Requests   Final    BOTTLES DRAWN AEROBIC AND ANAEROBIC Blood Culture results may not be optimal due to an inadequate volume of blood received in culture bottles   Culture   Final  NO GROWTH 3 DAYS Performed at [facility], [address] [place], [place] [address]    Report Status PENDING  Incomplete  MRSA PCR Screening     Status: Abnormal   Collection Time: 05/04/19  2:16 AM  Result Value Ref Range Status   MRSA by PCR POSITIVE (A) NEGATIVE Final    Comment:        The GeneXpert MRSA Assay (FDA approved for NASAL specimens only), is one component of a comprehensive MRSA colonization surveillance program. It is not intended to diagnose MRSA infection nor to guide or monitor treatment for MRSA infections. RESULT CALLED TO, READ BACK BY AND VERIFIED WITH: FELICIA PREUHOMME AT 0426 ON 05/04/2019 JJB Performed at [facility], [address], [place], [place] [address]   CULTURE, BLOOD (ROUTINE X 2) w Reflex to ID Panel     Status: None (Preliminary result)   Collection Time: 05/04/19  2:29 AM  Result Value Ref Range Status   Specimen Description BLOOD LEFT FOREARM  Final   Special Requests   Final    BOTTLES DRAWN AEROBIC AND ANAEROBIC Blood Culture results may not be optimal due to an inadequate volume of blood received in culture bottles   Culture   Final    NO GROWTH 3 DAYS Performed at [facility], [address],  [place], [place] [address]    Report Status PENDING  Incomplete  Culture, respiratory (non-expectorated)     Status: None   Collection Time: 05/04/19  8:04 AM  Result Value Ref Range Status   Specimen Description   Final    TRACHEAL ASPIRATE Performed at [facility], [address], [place], [place] [address]    Special Requests   Final    NONE Performed at [facility], [address] [place], [place] [address]    Gram Stain   Final    RARE WBC PRESENT, PREDOMINANTLY PMN FEW GRAM POSITIVE COCCI IN CHAINS RARE GRAM NEGATIVE RODS    Culture   Final    FEW Consistent with normal respiratory flora. Performed at [facility], [address] [place], [place] [address]    Report Status 05/07/2019 FINAL  Final     Management plans discussed with the patient, family and they are in agreement.  CODE STATUS:     Code Status Orders  (From admission, onward)         Start     Ordered   05/07/19 1248  Full code  Continuous     05/07/19 1247        Code Status History    Date Active Date Inactive Code Status Order ID Comments User Context   05/04/2019 0723 05/06/2019 0954 Full Code [id number]  Andreas Ohm, NP Inpatient   05/03/2019 2352 05/04/2019 0723 Full Code [id number]  Mansy, Vernetta Honey, MD ED      TOTAL TIME TAKING CARE OF THIS PATIENT: 35 minutes.    Alford Highland M.D on 05/07/2019 at 2:57 PM  Between 7am to 6pm - Pager - [phone]  After 6pm go to [email or web address] - Social research officer, government  [employer] Office  [phone]  CC: Primary care physician; Smitty Cords, DO

## 2019-05-08 ENCOUNTER — Inpatient Hospital Stay (HOSPITAL_COMMUNITY)
Admission: AD | Disposition: A | Discharge status: Home / Self Care | Payer: Self-pay | Source: Other Acute Inpatient Hospital | Attending: Interventional Cardiology

## 2019-05-08 ENCOUNTER — Encounter (HOSPITAL_COMMUNITY): Payer: Self-pay | Admitting: Internal Medicine

## 2019-05-08 DIAGNOSIS — I428 Other cardiomyopathies: Secondary | ICD-10-CM

## 2019-05-08 DIAGNOSIS — I422 Other hypertrophic cardiomyopathy: Secondary | ICD-10-CM

## 2019-05-08 HISTORY — PX: ICD IMPLANT: EP1208

## 2019-05-08 LAB — CBC WITH DIFFERENTIAL/PLATELET
Abs Immature Granulocytes: 0.06 10*3/uL (ref 0.00–0.07)
Basophils Absolute: 0 10*3/uL (ref 0.0–0.1)
Basophils Relative: 0 %
Eosinophils Absolute: 0.1 10*3/uL (ref 0.0–0.5)
Eosinophils Relative: 1 %
HCT: 44.6 % (ref 39.0–52.0)
Hemoglobin: 15.7 g/dL (ref 13.0–17.0)
Immature Granulocytes: 1 %
Lymphocytes Relative: 13 %
Lymphs Abs: 1.4 10*3/uL (ref 0.7–4.0)
MCH: 30 pg (ref 26.0–34.0)
MCHC: 35.2 g/dL (ref 30.0–36.0)
MCV: 85.3 fL (ref 80.0–100.0)
Monocytes Absolute: 1.2 10*3/uL — ABNORMAL HIGH (ref 0.1–1.0)
Monocytes Relative: 12 %
Neutro Abs: 7.8 10*3/uL — ABNORMAL HIGH (ref 1.7–7.7)
Neutrophils Relative %: 73 %
Platelets: 182 10*3/uL (ref 150–400)
RBC: 5.23 MIL/uL (ref 4.22–5.81)
RDW: 14.2 % (ref 11.5–15.5)
WBC: 10.5 10*3/uL (ref 4.0–10.5)
nRBC: 0 % (ref 0.0–0.2)

## 2019-05-08 LAB — BASIC METABOLIC PANEL
Anion gap: 12 (ref 5–15)
BUN: 11 mg/dL (ref 8–23)
CO2: 22 mmol/L (ref 22–32)
Calcium: 8.9 mg/dL (ref 8.9–10.3)
Chloride: 104 mmol/L (ref 98–111)
Creatinine, Ser: 0.99 mg/dL (ref 0.61–1.24)
GFR calc Af Amer: >60 mL/min (ref >60)
GFR calc non Af Amer: >60 mL/min (ref >60)
Glucose, Bld: 161 mg/dL — ABNORMAL HIGH (ref 70–99)
Potassium: 3.4 mmol/L — ABNORMAL LOW (ref 3.5–5.1)
Sodium: 138 mmol/L (ref 135–145)

## 2019-05-08 LAB — GLUCOSE, CAPILLARY
Glucose-Capillary: 154 mg/dL — ABNORMAL HIGH (ref 70–99)
Glucose-Capillary: 138 mg/dL — ABNORMAL HIGH (ref 70–99)
Glucose-Capillary: 136 mg/dL — ABNORMAL HIGH (ref 70–99)

## 2019-05-08 LAB — HIV ANTIBODY (ROUTINE TESTING W REFLEX): HIV Screen 4th Generation wRfx: NONREACTIVE

## 2019-05-08 LAB — PROTIME-INR
INR: 1.1 (ref 0.8–1.2)
Prothrombin Time: 13.8 seconds (ref 11.4–15.2)

## 2019-05-08 SURGERY — ICD IMPLANT
Anesthesia: LOCAL

## 2019-05-08 MED ORDER — SODIUM CHLORIDE 0.9 % IV SOLN: 250.0000 mL | INTRAVENOUS | Status: DC | PRN

## 2019-05-08 MED ORDER — SODIUM CHLORIDE 0.9% FLUSH
3.0000 mL | Freq: Two times a day (BID) | INTRAVENOUS | Status: DC
  Administered 2019-05-08 – 2019-05-09 (×2): 3 mL via INTRAVENOUS

## 2019-05-08 MED ORDER — MIDAZOLAM HCL 5 MG/5ML IJ SOLN
INTRAMUSCULAR | Status: DC | PRN
  Administered 2019-05-08 (×2): 2 mg via INTRAVENOUS
  Administered 2019-05-08 (×2): 1 mg via INTRAVENOUS

## 2019-05-08 MED ORDER — ONDANSETRON HCL 4 MG/2ML IJ SOLN: 4.0000 mg | Freq: Four times a day (QID) | INTRAMUSCULAR | Status: DC | PRN

## 2019-05-08 MED ORDER — CEFAZOLIN SODIUM-DEXTROSE 1-4 GM/50ML-% IV SOLN
1.0000 g | Freq: Four times a day (QID) | INTRAVENOUS | Status: AC
  Administered 2019-05-08 – 2019-05-09 (×3): 1 g via INTRAVENOUS
  Filled 2019-05-08 (×3): qty 50

## 2019-05-08 MED ORDER — FENTANYL CITRATE (PF) 100 MCG/2ML IJ SOLN
INTRAMUSCULAR | Status: DC | PRN
  Administered 2019-05-08: 25 ug via INTRAVENOUS
  Administered 2019-05-08: 12.5 ug via INTRAVENOUS

## 2019-05-08 MED ORDER — IOPAMIDOL (ISOVUE-370) INJECTION 76%
INTRAVENOUS | Status: DC | PRN
  Administered 2019-05-08: 15 mL via INTRAVENOUS

## 2019-05-08 MED ORDER — LIDOCAINE HCL 1 % IJ SOLN
INTRAMUSCULAR | Status: AC
  Filled 2019-05-08: qty 20

## 2019-05-08 MED ORDER — CHLORHEXIDINE GLUCONATE 4 % EX LIQD
Freq: Once | CUTANEOUS | Status: AC
  Administered 2019-05-08: 09:00:00 via TOPICAL
  Filled 2019-05-08: qty 60

## 2019-05-08 MED ORDER — ACETAMINOPHEN 325 MG PO TABS: 325.0000 mg | ORAL_TABLET | ORAL | Status: DC | PRN

## 2019-05-08 MED ORDER — HYDROCODONE-ACETAMINOPHEN 5-325 MG PO TABS: 1.0000 | ORAL_TABLET | ORAL | Status: DC | PRN

## 2019-05-08 MED ORDER — FENTANYL CITRATE (PF) 100 MCG/2ML IJ SOLN
INTRAMUSCULAR | Status: AC
  Filled 2019-05-08: qty 2

## 2019-05-08 MED ORDER — POTASSIUM CHLORIDE CRYS ER 20 MEQ PO TBCR
40.0000 meq | EXTENDED_RELEASE_TABLET | Freq: Once | ORAL | Status: AC
  Administered 2019-05-08: 40 meq via ORAL
  Filled 2019-05-08: qty 2

## 2019-05-08 MED ORDER — CEFAZOLIN SODIUM-DEXTROSE 2-4 GM/100ML-% IV SOLN
INTRAVENOUS | Status: AC
  Filled 2019-05-08: qty 100

## 2019-05-08 MED ORDER — AMIODARONE HCL 200 MG PO TABS
400.0000 mg | ORAL_TABLET | Freq: Three times a day (TID) | ORAL | Status: DC
  Administered 2019-05-08 – 2019-05-10 (×6): 400 mg via ORAL
  Filled 2019-05-08 (×6): qty 2

## 2019-05-08 MED ORDER — MIDAZOLAM HCL 5 MG/5ML IJ SOLN
INTRAMUSCULAR | Status: AC
  Filled 2019-05-08: qty 5

## 2019-05-08 MED ORDER — HEPARIN (PORCINE) IN NACL 1000-0.9 UT/500ML-% IV SOLN
INTRAVENOUS | Status: DC | PRN
  Administered 2019-05-08: 500 mL

## 2019-05-08 MED ORDER — SODIUM CHLORIDE 0.9 % IV SOLN
INTRAVENOUS | Status: AC
  Filled 2019-05-08: qty 2

## 2019-05-08 MED ORDER — LIDOCAINE HCL (PF) 1 % IJ SOLN
INTRAMUSCULAR | Status: DC | PRN
  Administered 2019-05-08: 60 mL

## 2019-05-08 MED ORDER — SODIUM CHLORIDE 0.9% FLUSH: 3.0000 mL | INTRAVENOUS | Status: DC | PRN

## 2019-05-08 MED ORDER — HEPARIN (PORCINE) IN NACL 1000-0.9 UT/500ML-% IV SOLN
INTRAVENOUS | Status: AC
  Filled 2019-05-08: qty 500

## 2019-05-08 SURGICAL SUPPLY — 7 items
CABLE SURGICAL S-101-97-12 (CABLE) ×2 IMPLANT
ICD VISIA MRI VR DVFB1D4 (ICD Generator) ×1 IMPLANT
LEAD SPRINT QUAT SEC 6935M-62 (Lead) ×2 IMPLANT
PAD PRO RADIOLUCENT 2001M-C (PAD) ×2 IMPLANT
SHEATH CLASSIC 9F (SHEATH) ×2 IMPLANT
TRAY PACEMAKER INSERTION (PACKS) ×2 IMPLANT
VISIA MRI VR DVFB1D4 (ICD Generator) ×2 IMPLANT

## 2019-05-08 NOTE — Progress Notes (Addendum)
Progress Note  Patient Name: Russell [name]. Date of Encounter: 05/08/2019  Primary Cardiologist: Dr. Mariah Milling  Subjective   Chest wall tenderness, no CP otherwise, no SOB  Inpatient Medications    Scheduled Meds: . aspirin EC  81 mg Oral Daily  . carvedilol  12.5 mg Oral BID WC  . gentamicin irrigation  80 mg Irrigation To Cath  . insulin aspart  0-15 Units Subcutaneous TID WC  . insulin aspart  0-5 Units Subcutaneous QHS  . latanoprost  1 drop Both Eyes QHS  . mupirocin ointment   Nasal BID  . polyvinyl alcohol  2 drop Both Eyes Q8H  . sacubitril-valsartan  1 tablet Oral BID  . sodium chloride flush  3 mL Intravenous Q12H  . spironolactone  25 mg Oral Daily  . timolol  1 drop Both Eyes Daily   Continuous Infusions: . sodium chloride    . sodium chloride 50 mL/hr at 05/07/19 2231  . amiodarone 30 mg/hr (05/08/19 7371)  .  ceFAZolin (ANCEF) IV     PRN Meds: sodium chloride, acetaminophen, hydrALAZINE, nitroGLYCERIN, ondansetron (ZOFRAN) IV, sodium chloride flush   Vital Signs    Vitals:   05/08/19 0323 05/08/19 0400 05/08/19 0500 05/08/19 0600  BP:  (!) 127/92 126/90 (!) 126/94  Pulse:  87 87 93  Resp:  11 17 19   Temp:  98.8 F (37.1 C)    TempSrc:  Oral    SpO2:  97% 96% 98%  Weight: 65.8 kg       Intake/Output Summary (Last 24 hours) at 05/08/2019 0749 Last data filed at 05/08/2019 0626 Gross per 24 hour  Intake 294.22 ml  Output 876 ml  Net -581.78 ml   Last 3 Weights 05/08/2019 05/07/2019 05/06/2019  Weight (lbs) 145 lb 1 oz 157 lb 10.1 oz 157 lb 11.2 oz  Weight (kg) 65.8 kg 71.5 kg 71.532 kg      Telemetry    SR, occ PVCs - Personally Reviewed  ECG    No new EKGs - Personally Reviewed  Physical Exam   GEN: No acute distress.   Neck: No JVD Cardiac: RRR, no murmurs, rubs, or gallops.  Respiratory: CTA b/l  GI: Soft, nontender, non-distended  MS: No edema; No deformity, chest wall tenderness Neuro:  Nonfocal  Psych: Normal affect   Labs     Chemistry Recent Labs  Lab 05/06/19 2212 05/07/19 0411 05/08/19 0224  NA 137 138 138  K 3.6 3.5 3.4*  CL 105 106 104  CO2 23 23 22   GLUCOSE 151* 160* 161*  BUN 11 12 11   CREATININE 0.94 0.88 0.99  CALCIUM 8.5* 8.6* 8.9  GFRNONAA >60 >60 >60  GFRAA >60 >60 >60  ANIONGAP 9 9 12      Hematology Recent Labs  Lab 05/06/19 0428 05/07/19 0411 05/08/19 0224  WBC 9.8 10.5 10.5  RBC 4.05* 4.54 5.23  HGB 12.1* 13.6 15.7  HCT 35.5* 39.5 44.6  MCV 87.7 87.0 85.3  MCH 29.9 30.0 30.0  MCHC 34.1 34.4 35.2  RDW 14.6 14.7 14.2  PLT 142* 165 182    Cardiac Enzymes Recent Labs  Lab 05/04/19 1738 05/06/19 2212 05/07/19 0001 05/07/19 0555  TROPONINI 0.50* 0.14* 0.15* 0.12*   No results for input(s): TROPIPOC in the last 168 hours.   BNP Recent Labs  Lab 05/04/19 0215  BNP 78.0     DDimer No results for input(s): DDIMER in the last 168 hours.   Radiology    No results  found.  Cardiac Studies   05/07/2019; R/LHC Conclusions: 1. Mild luminal irregularities in the LAD. No angiographically significant coronary artery disease. 2. Normal left and right heart filling pressures. 3. Severely reduced left ventricular systolic function (LVEF 25-30%). Recommendations: 1. Optimize evidence-based heart failure therapy. 2. Continue IV amiodarone. 3. Case discussed with electrophysiology at [facility]. We will plan for transfer later today for formal EP evaluation and possible ICD placement later this week.  05/04/2019: TTE IMPRESSIONS 1. The left ventricle has severely reduced systolic function, with an ejection fraction of 20-25%. The cavity size was moderately dilated. There is mildly increased left ventricular wall thickness. Left ventricular diastolic Doppler parameters are  consistent with impaired relaxation. 2. Aortic valve regurgitation is trivial by color flow Doppler. No stenosis of the aortic valve. 3. The interatrial septum was not assessed.  FINDINGS  Left Ventricle: The left ventricle has severely reduced systolic function, with an ejection fraction of 20-25%. The cavity size was moderately dilated. There is mildly increased left ventricular wall thickness. Left ventricular diastolic Doppler  parameters are consistent with impaired relaxation.  Right Ventricle: The right ventricle has mildly reduced systolic function. The cavity was normal. There is no increase in right ventricular wall thickness.  Left Atrium: Left atrial size was normal in size.  Right Atrium: Right atrial size was normal in size. Right atrial pressure is estimated at 10 mmHg.  Interatrial Septum: The interatrial septum was not assessed.  Pericardium: There is no evidence of pericardial effusion.  Mitral Valve: The mitral valve is normal in structure. Mitral valve regurgitation is not visualized by color flow Doppler.  Tricuspid Valve: The tricuspid valve is normal in structure. Tricuspid valve regurgitation was not visualized by color flow Doppler.  Aortic Valve: The aortic valve is normal in structure. Aortic valve regurgitation is trivial by color flow Doppler. There is No stenosis of the aortic valve.  Pulmonic Valve: The pulmonic valve was grossly normal. Pulmonic valve regurgitation is not visualized by color flow Doppler.   Patient Profile     68 y.o. male with a hx of DM, HTN, h/o NICM dating back to 2014 with improved LVEF to  40% with med tx,  who initially admitted to [facility] with an OOH, VT/cardiac arrest, eventually transferred to [facility] for consideration of ICD   05/03/2019 after sufferring a syncopal episode at home, EMS record notes the patient found unresponsive, CPR started >  3 AED shocks > > manual defibrillation 360Joules > one Epi, 300mg  amio, 360J defibrillation > epi > ROSC Narrative report pt was in pulseless VT. (rates >200 charted though tracings unavailable for review) CT noted no PE or dissection, + for minimally displaced fracture of  the anterior right fourth rib and nondisplaced fracture of the sternal body, was admitted, started on hypothermia protocol.  Trop were flat and felt 2/2 CPR/arrest/shocks, not ACS, maintained on amio gtt.  TTE noted LVEF 20-25%, (he was on BB/ACE out patient).  He was rewarmed, extubated, resumed on BB, amio stopped 05/04/19.  05/06/19 he had another VT arrest, initially pt described as awake, SBP 90's, given lidocine 1g, progressively hypotensive with dizziness, given versed and cardioverted with 120J to sinus tach, amio bolus given and gtt restarted. 05/07/2019 underwent R/LHC with only LI of the LAD, normal left/right heart pressures, LVEF 25-30%  He was transferred to [facility] last night for EP evaluation and consideration for ICD implant.  Assessment & Plan    1. VT arrest initial presentation, and recurrent on 5/12  LVEF 25-30% no CAD by cath  Remains on amio gtt.  The patient has h/o NICM, was on GDMT out patient (coreg and lisinopril) with last prior known EF 40% Now with VT arrest and recurrent reduced LVEF  He VT arrest 05/06/19 (about 1700) and placed back on amio gtt. He has IVCD, though fairly narrow QRS 80-120ms  I have re-visited the rational/indication for ICD implant, the procedure and it's potential risks/benifits, he remains agreeable to proceed.  I have discussed [place] law no driving 6 months  2. NICM     no evidence of fluid OL currently     on BB/Entresto, aldactone  3. HTN     no changes tdoay  4. DM     Continue current, SSI     Oral meds alone at home   For questions or updates, please contact [facility] Please consult [email or web address] for contact info under        Signed, Renee Lynn Ursuy, PA-C  05/08/2019, 7:49 AM     I have seen, examined the patient, and reviewed the above assessment and plan.  Changes to above are made where necessary.  On exam, RRR.  Pt with chronic nonischemic CM and EF 25% now admitted with hemodynamically  unstable ventricular tachycardia (confirmed by review of strips) requiring multiple AED shocks to be delivered. At this time, he meets criteria for ICD implantation for secondary prevention of sudden death.  I have had a thorough discussion with the patient reviewing options.  The patient has had opportunities to ask questions and have them answered. The patient and I have decided together through a shared decision making process to proceed with implantation of an ICD at this time.   Risks, benefits, alternatives to ICD implantation were discussed in detail with the patient today. The patient understands that the risks include but are not limited to bleeding, infection, pneumothorax, perforation, tamponade, vascular damage, renal failure, MI, stroke, death, inappropriate shocks, and lead dislodgement and wishes to proceed.   Co Sign: Zahria Ding, MD 05/08/2019 10:07 AM   

## 2019-05-08 NOTE — Consult Note (Signed)
Advanced Heart Failure Team Consult Note   Primary Physician: Smitty Cords, DO PCP-Cardiologist:  No primary care provider on file.  Reason for Consultation: CHF, cardiomyopathy of uncertain etiology  HPI:    Russell [name]. is seen today for evaluation of CHF at the request of Dr. Christoper Fabian.   68 y.o. with history of nonischemic cardiomyopathy since 2014.  At that time, EF was 25-30% by echo.  He had a cath in 2019 with no significant CAD, and EF by echo had improved to 40%.  He was last seen in the cardiology office in 7/19.   He had been doing well generally until 05/04/19. He had a ventricular fibrillation arrest playing baseball with bystander CPR when noted to have VF when EMS arrived.  ROSC after total 5 shocks.  Taken to [facility], underwent targeted temperature management. Extubated, was awake and alert, but then on 05/06/19 again had sustained VT with successful defibrillation.  He was started on amiodarone gtt.  R/LHC on 05/07/19 showed normal filling pressures and preserved cardiac output, no significant CAD.    He has been transferred to [facility] for ICD placement.  He has a narrow QRS.  Discussed with EP service, they are taking him for ICD now, will hold off on cardiac MRI as the cardiomyopathy has been chronic since 2014.  He feels like he is back to baseline symptomatically, no dyspnea/orthopnea.   RHC/LHC (05/07/19): No significant coronary disease.  Mean RA 7, PA 40/15, mean PCWP 13, CI 2.7  Echo (5/20): EF 20-25%, moderate LV dilation, mildly decreased RV systolic function.   Review of Systems: All systems reviewed and negative except as per HPI.   Home Medications Prior to Admission medications   Medication Sig Start Date End Date Taking? Authorizing Provider  Acetaminophen (TYLENOL PO) Take 1,000 mg by mouth every 4 (four) hours as needed (mild pain).     [provider]  amiodarone (NEXTERONE PREMIX) 360-4.14 MG/200ML-% SOLN Inject 30 mg/hr into  the vein continuous. 05/07/19   Alford Highland, MD  amLODipine (NORVASC) 10 MG tablet Take 1 tablet (10 mg total) by mouth daily. 06/24/18   Karamalegos, Netta Neat, DO  carvedilol (COREG) 12.5 MG tablet Take 1 tablet (12.5 mg total) by mouth 2 (two) times daily. 05/07/19   Alford Highland, MD  cetirizine (ZYRTEC) 10 MG tablet Take 10 mg by mouth daily. 04/13/17   [provider]  Cholecalciferol (VITAMIN D3) 2000 units TABS Take by mouth.    [provider]  dorzolamide-timolol (COSOPT) 22.3-6.8 MG/ML ophthalmic solution Place 1 drop into both eyes 2 (two) times daily.    [provider]  latanoprost (XALATAN) 0.005 % ophthalmic solution Place 1 drop into both eyes at bedtime.  05/30/18   [provider]  losartan (COZAAR) 50 MG tablet Take 1 tablet (50 mg total) by mouth daily. 05/08/19   Alford Highland, MD  mupirocin ointment (BACTROBAN) 2 % Place 1 application into the nose 2 (two) times daily. 05/07/19   Alford Highland, MD  polyvinyl alcohol (LIQUIFILM TEARS) 1.4 % ophthalmic solution Place 2 drops into both eyes every 8 (eight) hours. 05/07/19   Alford Highland, MD  spironolactone (ALDACTONE) 25 MG tablet Take 1 tablet (25 mg total) by mouth daily. 05/08/19   Alford Highland, MD  timolol (TIMOPTIC) 0.5 % ophthalmic solution Place 1 drop into both eyes 2 (two) times daily.  06/18/18   [provider]    Past Medical History: Past Medical History:  Diagnosis Date  . CAD (coronary artery disease)    05/07/2019 LHC showed nonobstructive CAD  . Diabetes (HCC)   . Frequent PVCs   . Heart failure with reduced ejection fraction (HCC)    04/2019 EF 20-25%  . Hypertension   . Impotence of organic origin   . NICM (nonischemic cardiomyopathy) (HCC)    HFrEF in setting of NICM  . Pain in joint, shoulder region   . Personal history of colonic polyps   . Ventricular tachycardia (HCC)    04/2019 in setting of cardiac arrest    Past Surgical History:  Past Surgical History:  Procedure Laterality Date  . CARDIAC CATHETERIZATION  11/17/2013  . COLONOSCOPY  2009  . RIGHT/LEFT HEART CATH AND CORONARY ANGIOGRAPHY N/A 05/07/2019   Procedure: RIGHT/LEFT HEART CATH AND CORONARY ANGIOGRAPHY;  Surgeon: Yvonne Kendall, MD;  Location: [facility] INVASIVE CV LAB;  Service: Cardiovascular;  Laterality: N/A;    Family History: Family History  Problem Relation Age of Onset  . Colon cancer Father        1970's  . Hypertension Mother   . Diabetes Mother     Social History: Social History   Socioeconomic History  . Marital status: Married    Spouse name: Not on file  . Number of children: Not on file  . Years of education: Not on file  . Highest education level: Not on file  Occupational History  . Occupation: [employer]  Social Needs  . Financial resource strain: Not on file  . Food insecurity:    Worry: Not on file    Inability: Not on file  . Transportation needs:    Medical: Not on file    Non-medical: Not on file  Tobacco Use  . Smoking status: Never Smoker  . Smokeless tobacco: Never Used  Substance and Sexual Activity  . Alcohol use: Yes    Comment: occassional  . Drug use: No  . Sexual activity: Not on file  Lifestyle  . Physical activity:    Days per week: Not on file    Minutes per session: Not on file  . Stress: Not on file  Relationships  . Social connections:    Talks on phone: Not on file    Gets together: Not on file    Attends religious service: Not on file    Active member of club or organization: Not on file    Attends meetings of clubs or organizations: Not on file    Relationship status: Not on file  Other Topics Concern  . Not on file  Social History Narrative  . Not on file    Allergies:  No Known Allergies  Objective:    Vital Signs:   Temp:  [98.6 F (37 C)-99.1 F (37.3 C)] 98.8 F (37.1 C) (05/14 0700) Pulse Rate:  [70-96] 83 (05/14 1000) Resp:  [0-32] 0 (05/14 0700) BP:  (121-160)/(75-107) 134/92 (05/14 1000) SpO2:  [94 %-100 %] 96 % (05/14 1000) Weight:  [65.8 kg] 65.8 kg (05/14 0323) Last BM Date: 05/06/19  Weight change: Filed Weights   05/08/19 0323  Weight: 65.8 kg    Intake/Output:   Intake/Output Summary (Last 24 hours) at 05/08/2019 1046 Last data filed at 05/08/2019 0800 Gross per 24 hour  Intake 316.39 ml  Output 876 ml  Net -559.61 ml      Physical Exam    General:  Well appearing. No resp difficulty HEENT: normal Neck: supple. JVP not elevated. Carotids 2+ bilat; no  bruits. No lymphadenopathy or thyromegaly appreciated. Cor: PMI lateral. Regular rate & rhythm. No rubs, gallops or murmurs. Lungs: clear Abdomen: soft, nontender, nondistended. No hepatosplenomegaly. No bruits or masses. Good bowel sounds. Extremities: no cyanosis, clubbing, rash, edema Neuro: alert & orientedx3, cranial nerves grossly intact. moves all 4 extremities w/o difficulty. Affect pleasant   Telemetry   NSR with occasional PVCs, personally reviewed  EKG    NSR with anterolateral TWIs, personally reviewed.   Labs   Basic Metabolic Panel: Recent Labs  Lab 05/04/19 0504 05/05/19 0443 05/06/19 0428 05/06/19 2212 05/07/19 0411 05/08/19 0224  NA 139 143 139 137 138 138  K 4.2 3.1* 3.7 3.6 3.5 3.4*  CL 108 110 104 105 106 104  CO2 24 23 24 23 23 22   GLUCOSE 177* 135* 157* 151* 160* 161*  BUN 18 14 13 11 12 11   CREATININE 0.94 0.94 0.83 0.94 0.88 0.99  CALCIUM 8.4* 7.9* 8.0* 8.5* 8.6* 8.9  MG 2.2 2.0  --  1.9  --   --   PHOS 3.7 2.7  --   --   --   --     Liver Function Tests: No results for input(s): AST, ALT, ALKPHOS, BILITOT, PROT, ALBUMIN in the last 168 hours. No results for input(s): LIPASE, AMYLASE in the last 168 hours. No results for input(s): AMMONIA in the last 168 hours.  CBC: Recent Labs  Lab 05/04/19 0504 05/05/19 0443 05/06/19 0428 05/07/19 0411 05/08/19 0224  WBC 13.2* 14.3* 9.8 10.5 10.5  NEUTROABS  --  12.3*  --    --  7.8*  HGB 13.6 12.9* 12.1* 13.6 15.7  HCT 40.4 38.5* 35.5* 39.5 44.6  MCV 89.2 88.3 87.7 87.0 85.3  PLT 175 169 142* 165 182    Cardiac Enzymes: Recent Labs  Lab 05/04/19 1152 05/04/19 1738 05/06/19 2212 05/07/19 0001 05/07/19 0555  TROPONINI 0.68* 0.50* 0.14* 0.15* 0.12*    BNP: BNP (last 3 results) Recent Labs    05/04/19 0215  BNP 78.0    ProBNP (last 3 results) No results for input(s): PROBNP in the last 8760 hours.   CBG: Recent Labs  Lab 05/07/19 1031 05/07/19 1213 05/07/19 1640 05/07/19 2244 05/08/19 0754  GLUCAP 125* 139* 133* 137* 154*    Coagulation Studies: Recent Labs    05/08/19 0224  LABPROT 13.8  INR 1.1     Imaging    No results found.   Medications:     Current Medications: . aspirin EC  81 mg Oral Daily  . carvedilol  12.5 mg Oral BID WC  . gentamicin irrigation  80 mg Irrigation To Cath  . insulin aspart  0-15 Units Subcutaneous TID WC  . insulin aspart  0-5 Units Subcutaneous QHS  . latanoprost  1 drop Both Eyes QHS  . mupirocin ointment   Nasal BID  . polyvinyl alcohol  2 drop Both Eyes Q8H  . sacubitril-valsartan  1 tablet Oral BID  . sodium chloride flush  3 mL Intravenous Q12H  . spironolactone  25 mg Oral Daily  . timolol  1 drop Both Eyes Daily     Infusions: . sodium chloride    . sodium chloride 50 mL/hr at 05/07/19 2231  . amiodarone 30 mg/hr (05/08/19 0800)  .  ceFAZolin (ANCEF) IV         Assessment/Plan   1. VF/VT: No definite trigger for this other than his underlying cardiomyopathy.  RHC shows well-compensated hemodynamics.   - He will get an  ICD today.  - Will increase Coreg as tolerated.  - Will discuss with EP regarding long-term anti-arrhythmic versus push Coreg and other HF meds.  2. Chronic systolic CHF: Nonischemic cardiomyopathy.  No family history of cardiomyopathy. ?Prior viral myocarditis. Well-compensated hemodynamics on 5/13 cath. He is not volume overloaded on exam. He will  need aggressive medical management of his cardiomyopathy. He is not getting cMRI prior to ICD given chronicity of cardiomyopathy.  - Transitioning to Entresto 24/26 bid.  - Continue spironolactone 25 mg daily.  - Continue Coreg 12.5 mg bid, increase as long as BP tolerates Entresto.  - Will need eventual CPX.  - Would be reasonable to follow in CHF clinic given long-standing CMP.  - Not CRT candidate with narrow QRS.   Length of Stay: 1  Marca Ancona, MD  05/08/2019, 10:46 AM  Advanced Heart Failure Team Pager [phone] (M-F; 7a - 4p)  Please contact [facility] for night-coverage after hours (4p -7a ) and weekends on [email or web address]

## 2019-05-08 NOTE — Progress Notes (Signed)
Orthopedic Tech Progress Note Patient Details:  Russell [name] 04/13/1951 [id number] Patient has arm sling Patient ID: [name], male   DOB: 06/08/1951, 68 y.o.   MRN: [id number]   [name] 05/08/2019, 1:01 PM

## 2019-05-08 NOTE — TOC Benefit Eligibility Note (Signed)
Transition of Care [facility]) Benefit Eligibility Note    Patient Details  Name: Russell [name]. MRN: [id number] Date of Birth: 06-15-1951   Medication/Dose: ENTRESTO 24- 26 MG BID  Covered?: Yes     Prescription Coverage Preferred Pharmacy: YES([facility])  Spoke with Person/Company/Phone Number:: STEVE([employer] $ [phone])  Co-Pay: $25.00  Prior Approval: No          Mardene Sayer Phone Number: 05/08/2019, 2:41 PM

## 2019-05-08 NOTE — H&P (View-Only) (Signed)
Progress Note  Patient Name: Russell [name]. Date of Encounter: 05/08/2019  Primary Cardiologist: Dr. Mariah Milling  Subjective   Chest wall tenderness, no CP otherwise, no SOB  Inpatient Medications    Scheduled Meds: . aspirin EC  81 mg Oral Daily  . carvedilol  12.5 mg Oral BID WC  . gentamicin irrigation  80 mg Irrigation To Cath  . insulin aspart  0-15 Units Subcutaneous TID WC  . insulin aspart  0-5 Units Subcutaneous QHS  . latanoprost  1 drop Both Eyes QHS  . mupirocin ointment   Nasal BID  . polyvinyl alcohol  2 drop Both Eyes Q8H  . sacubitril-valsartan  1 tablet Oral BID  . sodium chloride flush  3 mL Intravenous Q12H  . spironolactone  25 mg Oral Daily  . timolol  1 drop Both Eyes Daily   Continuous Infusions: . sodium chloride    . sodium chloride 50 mL/hr at 05/07/19 2231  . amiodarone 30 mg/hr (05/08/19 7371)  .  ceFAZolin (ANCEF) IV     PRN Meds: sodium chloride, acetaminophen, hydrALAZINE, nitroGLYCERIN, ondansetron (ZOFRAN) IV, sodium chloride flush   Vital Signs    Vitals:   05/08/19 0323 05/08/19 0400 05/08/19 0500 05/08/19 0600  BP:  (!) 127/92 126/90 (!) 126/94  Pulse:  87 87 93  Resp:  11 17 19   Temp:  98.8 F (37.1 C)    TempSrc:  Oral    SpO2:  97% 96% 98%  Weight: 65.8 kg       Intake/Output Summary (Last 24 hours) at 05/08/2019 0749 Last data filed at 05/08/2019 0626 Gross per 24 hour  Intake 294.22 ml  Output 876 ml  Net -581.78 ml   Last 3 Weights 05/08/2019 05/07/2019 05/06/2019  Weight (lbs) 145 lb 1 oz 157 lb 10.1 oz 157 lb 11.2 oz  Weight (kg) 65.8 kg 71.5 kg 71.532 kg      Telemetry    SR, occ PVCs - Personally Reviewed  ECG    No new EKGs - Personally Reviewed  Physical Exam   GEN: No acute distress.   Neck: No JVD Cardiac: RRR, no murmurs, rubs, or gallops.  Respiratory: CTA b/l  GI: Soft, nontender, non-distended  MS: No edema; No deformity, chest wall tenderness Neuro:  Nonfocal  Psych: Normal affect   Labs     Chemistry Recent Labs  Lab 05/06/19 2212 05/07/19 0411 05/08/19 0224  NA 137 138 138  K 3.6 3.5 3.4*  CL 105 106 104  CO2 23 23 22   GLUCOSE 151* 160* 161*  BUN 11 12 11   CREATININE 0.94 0.88 0.99  CALCIUM 8.5* 8.6* 8.9  GFRNONAA >60 >60 >60  GFRAA >60 >60 >60  ANIONGAP 9 9 12      Hematology Recent Labs  Lab 05/06/19 0428 05/07/19 0411 05/08/19 0224  WBC 9.8 10.5 10.5  RBC 4.05* 4.54 5.23  HGB 12.1* 13.6 15.7  HCT 35.5* 39.5 44.6  MCV 87.7 87.0 85.3  MCH 29.9 30.0 30.0  MCHC 34.1 34.4 35.2  RDW 14.6 14.7 14.2  PLT 142* 165 182    Cardiac Enzymes Recent Labs  Lab 05/04/19 1738 05/06/19 2212 05/07/19 0001 05/07/19 0555  TROPONINI 0.50* 0.14* 0.15* 0.12*   No results for input(s): TROPIPOC in the last 168 hours.   BNP Recent Labs  Lab 05/04/19 0215  BNP 78.0     DDimer No results for input(s): DDIMER in the last 168 hours.   Radiology    No results  found.  Cardiac Studies   05/07/2019; R/LHC Conclusions: 1. Mild luminal irregularities in the LAD. No angiographically significant coronary artery disease. 2. Normal left and right heart filling pressures. 3. Severely reduced left ventricular systolic function (LVEF 25-30%). Recommendations: 1. Optimize evidence-based heart failure therapy. 2. Continue IV amiodarone. 3. Case discussed with electrophysiology at [facility]. We will plan for transfer later today for formal EP evaluation and possible ICD placement later this week.  05/04/2019: TTE IMPRESSIONS 1. The left ventricle has severely reduced systolic function, with an ejection fraction of 20-25%. The cavity size was moderately dilated. There is mildly increased left ventricular wall thickness. Left ventricular diastolic Doppler parameters are  consistent with impaired relaxation. 2. Aortic valve regurgitation is trivial by color flow Doppler. No stenosis of the aortic valve. 3. The interatrial septum was not assessed.  FINDINGS  Left Ventricle: The left ventricle has severely reduced systolic function, with an ejection fraction of 20-25%. The cavity size was moderately dilated. There is mildly increased left ventricular wall thickness. Left ventricular diastolic Doppler  parameters are consistent with impaired relaxation.  Right Ventricle: The right ventricle has mildly reduced systolic function. The cavity was normal. There is no increase in right ventricular wall thickness.  Left Atrium: Left atrial size was normal in size.  Right Atrium: Right atrial size was normal in size. Right atrial pressure is estimated at 10 mmHg.  Interatrial Septum: The interatrial septum was not assessed.  Pericardium: There is no evidence of pericardial effusion.  Mitral Valve: The mitral valve is normal in structure. Mitral valve regurgitation is not visualized by color flow Doppler.  Tricuspid Valve: The tricuspid valve is normal in structure. Tricuspid valve regurgitation was not visualized by color flow Doppler.  Aortic Valve: The aortic valve is normal in structure. Aortic valve regurgitation is trivial by color flow Doppler. There is No stenosis of the aortic valve.  Pulmonic Valve: The pulmonic valve was grossly normal. Pulmonic valve regurgitation is not visualized by color flow Doppler.   Patient Profile     68 y.o. male with a hx of DM, HTN, h/o NICM dating back to 2014 with improved LVEF to  40% with med tx,  who initially admitted to [facility] with an OOH, VT/cardiac arrest, eventually transferred to [facility] for consideration of ICD   05/03/2019 after sufferring a syncopal episode at home, EMS record notes the patient found unresponsive, CPR started >  3 AED shocks > > manual defibrillation 360Joules > one Epi, 300mg  amio, 360J defibrillation > epi > ROSC Narrative report pt was in pulseless VT. (rates >200 charted though tracings unavailable for review) CT noted no PE or dissection, + for minimally displaced fracture of  the anterior right fourth rib and nondisplaced fracture of the sternal body, was admitted, started on hypothermia protocol.  Trop were flat and felt 2/2 CPR/arrest/shocks, not ACS, maintained on amio gtt.  TTE noted LVEF 20-25%, (he was on BB/ACE out patient).  He was rewarmed, extubated, resumed on BB, amio stopped 05/04/19.  05/06/19 he had another VT arrest, initially pt described as awake, SBP 90's, given lidocine 1g, progressively hypotensive with dizziness, given versed and cardioverted with 120J to sinus tach, amio bolus given and gtt restarted. 05/07/2019 underwent R/LHC with only LI of the LAD, normal left/right heart pressures, LVEF 25-30%  He was transferred to [facility] last night for EP evaluation and consideration for ICD implant.  Assessment & Plan    1. VT arrest initial presentation, and recurrent on 5/12  LVEF 25-30% no CAD by cath  Remains on amio gtt.  The patient has h/o NICM, was on GDMT out patient (coreg and lisinopril) with last prior known EF 40% Now with VT arrest and recurrent reduced LVEF  He VT arrest 05/06/19 (about 1700) and placed back on amio gtt. He has IVCD, though fairly narrow QRS 80-120ms  I have re-visited the rational/indication for ICD implant, the procedure and it's potential risks/benifits, he remains agreeable to proceed.  I have discussed [place] law no driving 6 months  2. NICM     no evidence of fluid OL currently     on BB/Entresto, aldactone  3. HTN     no changes tdoay  4. DM     Continue current, SSI     Oral meds alone at home   For questions or updates, please contact [facility] Please consult [email or web address] for contact info under        Signed, Sheilah Pigeon, PA-C  05/08/2019, 7:49 AM     I have seen, examined the patient, and reviewed the above assessment and plan.  Changes to above are made where necessary.  On exam, RRR.  Pt with chronic nonischemic CM and EF 25% now admitted with hemodynamically  unstable ventricular tachycardia (confirmed by review of strips) requiring multiple AED shocks to be delivered. At this time, he meets criteria for ICD implantation for secondary prevention of sudden death.  I have had a thorough discussion with the patient reviewing options.  The patient has had opportunities to ask questions and have them answered. The patient and I have decided together through a shared decision making process to proceed with implantation of an ICD at this time.   Risks, benefits, alternatives to ICD implantation were discussed in detail with the patient today. The patient understands that the risks include but are not limited to bleeding, infection, pneumothorax, perforation, tamponade, vascular damage, renal failure, MI, stroke, death, inappropriate shocks, and lead dislodgement and wishes to proceed.   Co Sign: Hillis Range, MD 05/08/2019 10:07 AM

## 2019-05-08 NOTE — Interval H&P Note (Signed)
History and Physical Interval Note:  05/08/2019 10:12 AM  [name]  has presented today for surgery, with the diagnosis of cardiac arrest/VT.  The various methods of treatment have been discussed with the patient and family. After consideration of risks, benefits and other options for treatment, the patient has consented to  Procedure(s): ICD IMPLANT (N/A) as a surgical intervention.  The patient's history has been reviewed, patient examined, no change in status, stable for surgery.  I have reviewed the patient's chart and labs.  Questions were answered to the patient's satisfaction.    ICD Criteria  Current LVEF:25%. Within 12 months prior to implant: Yes   Heart failure history: Yes, Class II  Cardiomyopathy history: Yes, Non-Ischemic Cardiomyopathy.  Atrial Fibrillation/Atrial Flutter: No.  Ventricular tachycardia history: Yes, Hemodynamic instability present. VT Type: Sustained Ventricular Tachycardia - Monomorphic.  Cardiac arrest history: Yes, Ventricular Tachycardia.  History of syndromes with risk of sudden death: No.  Previous ICD: No.  Current ICD indication: Secondary  PPM indication: No.  Class I or II Bradycardia indication present: No  Beta Blocker therapy for 3 or more months: Yes, prescribed.   Ace Inhibitor/ARB therapy for 3 or more months: Yes, prescribed.    I have seen Future Monrroy. is a 68 y.o. male who presents for ICD implant for secondary prevention of sudden death.  The patient's chart has been reviewed and they meet criteria for ICD implant.  He has a narrow QRS and does not meet criteria for CRT.  I have had a thorough discussion with the patient reviewing options.  The patient has had opportunities to ask questions and have them answered. The patient and I have decided together through the [facility] Heart Care Share Decision Support Tool to proceed with an ICD at this time.  Risks, benefits, alternatives to ICD implantation were discussed in detail  with the patient today. The patient  understands that the risks include but are not limited to bleeding, infection, pneumothorax, perforation, tamponade, vascular damage, renal failure, MI, stroke, death, inappropriate shocks, and lead dislodgement and wishes to proceed.    Hillis Range MD, [facility] 05/08/2019 10:13 AM

## 2019-05-09 ENCOUNTER — Telehealth: Payer: Self-pay

## 2019-05-09 ENCOUNTER — Inpatient Hospital Stay (HOSPITAL_COMMUNITY): Payer: BLUE CROSS/BLUE SHIELD

## 2019-05-09 DIAGNOSIS — I10 Essential (primary) hypertension: Secondary | ICD-10-CM

## 2019-05-09 LAB — HEPATIC FUNCTION PANEL
ALT: 43 U/L (ref 0–44)
AST: 33 U/L (ref 15–41)
Albumin: 2.9 g/dL — ABNORMAL LOW (ref 3.5–5.0)
Alkaline Phosphatase: 60 U/L (ref 38–126)
Bilirubin, Direct: 0.5 mg/dL — ABNORMAL HIGH (ref 0.0–0.2)
Indirect Bilirubin: 0.9 mg/dL (ref 0.3–0.9)
Total Bilirubin: 1.4 mg/dL — ABNORMAL HIGH (ref 0.3–1.2)
Total Protein: 6.8 g/dL (ref 6.5–8.1)

## 2019-05-09 LAB — CULTURE, BLOOD (ROUTINE X 2)
Culture: NO GROWTH
Culture: NO GROWTH

## 2019-05-09 LAB — BASIC METABOLIC PANEL
Anion gap: 12 (ref 5–15)
BUN: 10 mg/dL (ref 8–23)
CO2: 21 mmol/L — ABNORMAL LOW (ref 22–32)
Calcium: 8.5 mg/dL — ABNORMAL LOW (ref 8.9–10.3)
Chloride: 104 mmol/L (ref 98–111)
Creatinine, Ser: 0.97 mg/dL (ref 0.61–1.24)
GFR calc Af Amer: >60 mL/min (ref >60)
GFR calc non Af Amer: >60 mL/min (ref >60)
Glucose, Bld: 170 mg/dL — ABNORMAL HIGH (ref 70–99)
Potassium: 3.7 mmol/L (ref 3.5–5.1)
Sodium: 137 mmol/L (ref 135–145)

## 2019-05-09 LAB — TSH: TSH: 2.334 u[IU]/mL (ref 0.350–4.500)

## 2019-05-09 LAB — CBC WITH DIFFERENTIAL/PLATELET
Abs Immature Granulocytes: 0.05 10*3/uL (ref 0.00–0.07)
Basophils Absolute: 0 10*3/uL (ref 0.0–0.1)
Basophils Relative: 0 %
Eosinophils Absolute: 0.1 10*3/uL (ref 0.0–0.5)
Eosinophils Relative: 1 %
HCT: 44.5 % (ref 39.0–52.0)
Hemoglobin: 15.3 g/dL (ref 13.0–17.0)
Immature Granulocytes: 0 %
Lymphocytes Relative: 11 %
Lymphs Abs: 1.2 10*3/uL (ref 0.7–4.0)
MCH: 29.4 pg (ref 26.0–34.0)
MCHC: 34.4 g/dL (ref 30.0–36.0)
MCV: 85.6 fL (ref 80.0–100.0)
Monocytes Absolute: 1.7 10*3/uL — ABNORMAL HIGH (ref 0.1–1.0)
Monocytes Relative: 15 %
Neutro Abs: 8.4 10*3/uL — ABNORMAL HIGH (ref 1.7–7.7)
Neutrophils Relative %: 73 %
Platelets: 185 10*3/uL (ref 150–400)
RBC: 5.2 MIL/uL (ref 4.22–5.81)
RDW: 14.1 % (ref 11.5–15.5)
WBC: 11.5 10*3/uL — ABNORMAL HIGH (ref 4.0–10.5)
nRBC: 0 % (ref 0.0–0.2)

## 2019-05-09 LAB — GLUCOSE, CAPILLARY
Glucose-Capillary: 155 mg/dL — ABNORMAL HIGH (ref 70–99)
Glucose-Capillary: 173 mg/dL — ABNORMAL HIGH (ref 70–99)

## 2019-05-09 LAB — MAGNESIUM: Magnesium: 2 mg/dL (ref 1.7–2.4)

## 2019-05-09 MED ORDER — POTASSIUM CHLORIDE CRYS ER 20 MEQ PO TBCR
40.0000 meq | EXTENDED_RELEASE_TABLET | Freq: Once | ORAL | Status: AC
  Administered 2019-05-09: 09:00:00 40 meq via ORAL
  Filled 2019-05-09: qty 2

## 2019-05-09 MED ORDER — CARVEDILOL 25 MG PO TABS
25.0000 mg | ORAL_TABLET | Freq: Two times a day (BID) | ORAL | Status: DC
  Administered 2019-05-09 – 2019-05-10 (×2): 25 mg via ORAL
  Filled 2019-05-09 (×2): qty 1

## 2019-05-09 MED FILL — Lidocaine HCl Local Inj 1%: INTRAMUSCULAR | Qty: 60 | Status: AC

## 2019-05-09 NOTE — Telephone Encounter (Addendum)
Returned the call to the pt daughter Gloris Manchester. Lmtcb.  Per 05/05/19 telephone encounter. Pt FMLA paperwork has been sent to [employer] on 05/05/19 she will need to contact them to check the status.

## 2019-05-09 NOTE — Progress Notes (Addendum)
**Note Russell-Identified via Obfuscation** Progress Note  Patient Name: Russell [name]. Date of Encounter: 05/09/2019  Primary Cardiologist: Dr. Mariah Milling  Subjective   Feels well, denies any pain at implant site or chest wall.  No SOB  Inpatient Medications    Scheduled Meds:  amiodarone  400 mg Oral TID   carvedilol  25 mg Oral BID WC   insulin aspart  0-15 Units Subcutaneous TID WC   insulin aspart  0-5 Units Subcutaneous QHS   latanoprost  1 drop Both Eyes QHS   mupirocin ointment   Nasal BID   polyvinyl alcohol  2 drop Both Eyes Q8H   sacubitril-valsartan  1 tablet Oral BID   sodium chloride flush  3 mL Intravenous Q12H   spironolactone  25 mg Oral Daily   timolol  1 drop Both Eyes Daily   Continuous Infusions:  sodium chloride     PRN Meds: sodium chloride, acetaminophen, hydrALAZINE, HYDROcodone-acetaminophen, nitroGLYCERIN, ondansetron (ZOFRAN) IV, sodium chloride flush   Vital Signs    Vitals:   05/08/19 2200 05/09/19 0020 05/09/19 0346 05/09/19 0816  BP: (!) 117/96 (!) 135/98 (!) 154/100 (!) 153/109  Pulse: 83   (!) 103  Resp: (!) 22     Temp:  99.2 F (37.3 C) 99.4 F (37.4 C)   TempSrc:   Oral   SpO2: 96%     Weight:   67.4 kg     Intake/Output Summary (Last 24 hours) at 05/09/2019 0848 Last data filed at 05/09/2019 0728 Gross per 24 hour  Intake 1040 ml  Output 750 ml  Net 290 ml   Last 3 Weights 05/09/2019 05/08/2019 05/07/2019  Weight (lbs) 148 lb 8 oz 145 lb 1 oz 157 lb 10.1 oz  Weight (kg) 67.359 kg 65.8 kg 71.5 kg      Telemetry    SR/ST 90's110 - Personally Reviewed  ECG    SR - Personally Reviewed  Physical Exam   GEN: No acute distress.   Neck: No JVD Cardiac: RRR, no murmurs, rubs, or gallops.  Respiratory: CTA b/l. GI: not examined  MS: No edema; No deformity. Neuro:  Nonfocal  Psych: Normal affect   ICD site is stable, no hematoma, no bleeding  Labs    Chemistry Recent Labs  Lab 05/07/19 0411 05/08/19 0224 05/09/19 0437  NA 138 138 137  K  3.5 3.4* 3.7  CL 106 104 104  CO2 23 22 21*  GLUCOSE 160* 161* 170*  BUN 12 11 10   CREATININE 0.88 0.99 0.97  CALCIUM 8.6* 8.9 8.5*  PROT  --   --  6.8  ALBUMIN  --   --  2.9*  AST  --   --  33  ALT  --   --  43  ALKPHOS  --   --  60  BILITOT  --   --  1.4*  GFRNONAA >60 >60 >60  GFRAA >60 >60 >60  ANIONGAP 9 12 12      Hematology Recent Labs  Lab 05/07/19 0411 05/08/19 0224 05/09/19 0437  WBC 10.5 10.5 11.5*  RBC 4.54 5.23 5.20  HGB 13.6 15.7 15.3  HCT 39.5 44.6 44.5  MCV 87.0 85.3 85.6  MCH 30.0 30.0 29.4  MCHC 34.4 35.2 34.4  RDW 14.7 14.2 14.1  PLT 165 182 185    Cardiac Enzymes Recent Labs  Lab 05/04/19 1738 05/06/19 2212 05/07/19 0001 05/07/19 0555  TROPONINI 0.50* 0.14* 0.15* 0.12*   No results for input(s): TROPIPOC in the last 168 hours.  BNP Recent Labs  Lab 05/04/19 0215  BNP 78.0     DDimer No results for input(s): DDIMER in the last 168 hours.   Radiology    Dg Chest 2 View Result Date: 05/09/2019 CLINICAL DATA:  68 year old male status post cardiac device placement. EXAM: CHEST - 2 VIEW COMPARISON:  05/05/2019 and earlier. FINDINGS: PA and lateral views. New left chest cardiac AICD. No pneumothorax. Stable cardiac size and mediastinal contours. The lead courses to the RV apex region. Worsening ventilation at the right lung base appears related to small volume pleural effusion and adjacent airspace disease. There is no pulmonary edema. No other confluent opacity. No acute osseous abnormality identified. Negative visible bowel gas pattern. IMPRESSION: 1. Left chest cardiac AICD placed with no adverse features. 2. Worsening ventilation at the right lung base with small pleural effusion and probable atelectasis. Electronically Signed   By: Odessa Fleming M.D.   On: 05/09/2019 08:34    Cardiac Studies   05/07/2019; R/LHC Conclusions: 1. Mild luminal irregularities in the LAD. No angiographically significant coronary artery disease. 2. Normal left and  right heart filling pressures. 3. Severely reduced left ventricular systolic function (LVEF 25-30%). Recommendations: 1. Optimize evidence-based heart failure therapy. 2. Continue IV amiodarone. 3. Case discussed with electrophysiology at [facility]. We will plan for transfer later today for formal EP evaluation and possible ICD placement later this week.  05/04/2019: TTE IMPRESSIONS 1. The left ventricle has severely reduced systolic function, with an ejection fraction of 20-25%. The cavity size was moderately dilated. There is mildly increased left ventricular wall thickness. Left ventricular diastolic Doppler parameters are  consistent with impaired relaxation. 2. Aortic valve regurgitation is trivial by color flow Doppler. No stenosis of the aortic valve. 3. The interatrial septum was not assessed.  FINDINGS Left Ventricle: The left ventricle has severely reduced systolic function, with an ejection fraction of 20-25%. The cavity size was moderately dilated. There is mildly increased left ventricular wall thickness. Left ventricular diastolic Doppler  parameters are consistent with impaired relaxation.  Right Ventricle: The right ventricle has mildly reduced systolic function. The cavity was normal. There is no increase in right ventricular wall thickness.  Left Atrium: Left atrial size was normal in size.  Right Atrium: Right atrial size was normal in size. Right atrial pressure is estimated at 10 mmHg.  Interatrial Septum: The interatrial septum was not assessed.  Pericardium: There is no evidence of pericardial effusion.  Mitral Valve: The mitral valve is normal in structure. Mitral valve regurgitation is not visualized by color flow Doppler.  Tricuspid Valve: The tricuspid valve is normal in structure. Tricuspid valve regurgitation was not visualized by color flow Doppler.  Aortic Valve: The aortic valve is normal in structure. Aortic valve regurgitation is  trivial by color flow Doppler. There is No stenosis of the aortic valve.  Pulmonic Valve: The pulmonic valve was grossly normal. Pulmonic valve regurgitation is not visualized by color flow Doppler.   Patient Profile     68 y.o. male 68 y.o. male with a hx of DM, HTN, h/o NICM dating back to 2014 with improved LVEF to 40% with med tx, who initially admitted to [facility] with an OOH, VT/cardiac arrest, eventually transferred to [facility] for consideration of ICD   05/03/2019 after sufferring a syncopal episode at home, EMS record notes the patient found unresponsive, CPR started >3 AED shocks >>manual defibrillation 360Joules >one Epi, 300mg  amio, 360J defibrillation >epi >ROSC Narrative report pt was in pulseless VT. (rates >200  charted though tracings unavailable for review) CT noted no PE or dissection, + for minimally displaced fracture of the anterior right fourth rib and nondisplaced fracture of the sternal body, was admitted, started on hypothermia protocol. Trop were flat and felt 2/2 CPR/arrest/shocks, not ACS, maintained on amio gtt.  TTE noted LVEF 20-25%, (he was on BB/ACE out patient). He was rewarmed, extubated, resumed on BB, amio stopped 05/04/19.  05/06/19 he had another VT arrest, initially pt described as awake, SBP 90's, given lidocine 1g, progressively hypotensive with dizziness, given versed and cardioverted with 120J to sinus tach, amio bolus given and gtt restarted. 05/07/2019 underwent R/LHC with only LI of the LAD, normal left/right heart pressures, LVEF 25-30%  [facility] and consideration for ICD implant.  Assessment & Plan    1. VT arrest initial presentation, and recurrent on 5/12 LVEF 25-30% no CAD by cath  The patient has h/o NICM, was on GDMT out patient (coreg and lisinopril) with last prior known EF 40% Now with VT arrest and recurrent reduced LVEF He sustained, unstable VT event 05/06/19 (about 1700) and placed back on  amio gtt. He has IVCD, though fairly narrow QRS 80-14020ms  He is s/p MDT single chamber ICD implant yesterday with Dr. Johney FrameAllred Site is stable CXR with stable lead position, no PTX In d/w Dr. Johney FrameAllred, will forgo device check this AM Wound care and activity instructions were discussed with the patient  I discussed [place] law no driving 6 months with the patient, he is aware  He has been transitioned to PO amiodarone If his rhythm remains stable on PO amiodarone today, anticipate discharge tomorrow On discharge will plan for  Amiodarone 400mg  BID for 1 week, then daily for 4 weeks, then 200mg  daily  Follow up is in place   2. NICM  no evidence of fluid OL currently by exam     on BB/Entresto, aldactone     Coreg was up-titrated this Am for better BP and HR management  Encouraged OOB today, ordered IS for some atelectasis on CXR Appreciate CHF team, will be good to have him plugged into their team going forward given long standing (though well compensated) CM  3. HTN coreg was increased  4. DM   Continue current, SSI     Oral meds alone at home  5. suffered w/syncope on/or CPR, [facility]     Non-displaced incomplete sternal fx     Minimally siplaced fx of anterior R 4th rib, ade indeterminate, likely acute      Pt denies much if any chest wall pain    For questions or updates, please contact [facility] Please consult [email or web address] for contact info under        Signed, Sheilah PigeonRenee Lynn Ursuy, PA-C  05/09/2019, 8:48 AM     I have seen, examined the patient, and reviewed the above assessment and plan.  Changes to above are made where necessary.  On exam, RRR.  No hematoma.  CXR reveals stable lead, no ptx.  Increase coreg to 25mg  BID for treatment of VT. Continue amiodarone as per Ms Keitha ButteUrsuy above.  Anticipate discharge to home tomorrow No driving x 6 months EP to see virtually in 4 weeks  Co Sign: Hillis RangeJames Lekeisha Arenas, MD 05/09/2019 9:19 AM

## 2019-05-09 NOTE — Telephone Encounter (Addendum)
Call received from Rey B, RN @[facility] . Rey sts that the pt will need FMLA forms signed by Dr. Mariah Milling regarding his recent hospitalization. Per Rey pt has given verbal permission for [place] to discuss the paperwork with his daughter Gloris [place]. Pt daughter will be contact the office to discuss.

## 2019-05-10 ENCOUNTER — Telehealth: Payer: Self-pay | Admitting: Student

## 2019-05-10 DIAGNOSIS — I428 Other cardiomyopathies: Secondary | ICD-10-CM

## 2019-05-10 DIAGNOSIS — Z95810 Presence of automatic (implantable) cardiac defibrillator: Secondary | ICD-10-CM

## 2019-05-10 LAB — CBC WITH DIFFERENTIAL/PLATELET
Abs Immature Granulocytes: 0.07 10*3/uL (ref 0.00–0.07)
Basophils Absolute: 0 10*3/uL (ref 0.0–0.1)
Basophils Relative: 0 %
Eosinophils Absolute: 0.1 10*3/uL (ref 0.0–0.5)
Eosinophils Relative: 1 %
HCT: 41.7 % (ref 39.0–52.0)
Hemoglobin: 14.4 g/dL (ref 13.0–17.0)
Immature Granulocytes: 1 %
Lymphocytes Relative: 13 %
Lymphs Abs: 1.5 10*3/uL (ref 0.7–4.0)
MCH: 29.9 pg (ref 26.0–34.0)
MCHC: 34.5 g/dL (ref 30.0–36.0)
MCV: 86.5 fL (ref 80.0–100.0)
Monocytes Absolute: 1.7 10*3/uL — ABNORMAL HIGH (ref 0.1–1.0)
Monocytes Relative: 15 %
Neutro Abs: 8.2 10*3/uL — ABNORMAL HIGH (ref 1.7–7.7)
Neutrophils Relative %: 70 %
Platelets: 167 10*3/uL (ref 150–400)
RBC: 4.82 MIL/uL (ref 4.22–5.81)
RDW: 14.3 % (ref 11.5–15.5)
WBC: 11.6 10*3/uL — ABNORMAL HIGH (ref 4.0–10.5)
nRBC: 0 % (ref 0.0–0.2)

## 2019-05-10 LAB — BASIC METABOLIC PANEL
Anion gap: 10 (ref 5–15)
BUN: 17 mg/dL (ref 8–23)
CO2: 22 mmol/L (ref 22–32)
Calcium: 8.8 mg/dL — ABNORMAL LOW (ref 8.9–10.3)
Chloride: 107 mmol/L (ref 98–111)
Creatinine, Ser: 1.04 mg/dL (ref 0.61–1.24)
GFR calc Af Amer: >60 mL/min (ref >60)
GFR calc non Af Amer: >60 mL/min (ref >60)
Glucose, Bld: 133 mg/dL — ABNORMAL HIGH (ref 70–99)
Potassium: 4.1 mmol/L (ref 3.5–5.1)
Sodium: 139 mmol/L (ref 135–145)

## 2019-05-10 LAB — GLUCOSE, CAPILLARY: Glucose-Capillary: 133 mg/dL — ABNORMAL HIGH (ref 70–99)

## 2019-05-10 MED ORDER — AMLODIPINE BESYLATE 10 MG PO TABS: 5.0000 mg | ORAL_TABLET | Freq: Every day | ORAL | 3 refills | Status: DC

## 2019-05-10 MED ORDER — ASPIRIN EC 81 MG PO TBEC: 81.0000 mg | DELAYED_RELEASE_TABLET | Freq: Every day | ORAL | Status: AC

## 2019-05-10 MED ORDER — AMIODARONE HCL 200 MG PO TABS: 200.0000 mg | ORAL_TABLET | Freq: Every day | ORAL | 2 refills | Status: DC

## 2019-05-10 MED ORDER — AMIODARONE HCL 400 MG PO TABS: ORAL_TABLET | ORAL | 0 refills | Status: DC

## 2019-05-10 MED ORDER — NITROGLYCERIN 0.4 MG SL SUBL: 0.4000 mg | SUBLINGUAL_TABLET | SUBLINGUAL | 2 refills | Status: DC | PRN

## 2019-05-10 MED ORDER — CARVEDILOL 25 MG PO TABS: 25.0000 mg | ORAL_TABLET | Freq: Two times a day (BID) | ORAL | 2 refills | Status: DC

## 2019-05-10 MED ORDER — METFORMIN HCL 500 MG PO TABS: 500.0000 mg | ORAL_TABLET | Freq: Every day | ORAL | Status: DC

## 2019-05-10 MED ORDER — SACUBITRIL-VALSARTAN 24-26 MG PO TABS: 1.0000 | ORAL_TABLET | Freq: Two times a day (BID) | ORAL | 2 refills | Status: DC

## 2019-05-10 NOTE — Progress Notes (Signed)
Reviewed all d/c instructions with patient, verbalized understanding, written copy given.  IV d/ced without problem.  Awaiting daughter for d/c via WC.

## 2019-05-10 NOTE — Discharge Instructions (Signed)
Medication Instructions: - Take Amiodarone  twice daily through 05/14/2019. Then starting on 05/15/2019, decrease dose to  daily for 4 weeks. On 06/12/2019, transition to  daily.  - Take Coreg  twice daily. - Take Entresto 24-26mg  twice daily. - Reduce home Amlodipine to  daily. - Please take all other medications as described elsewhere on discharge summary.   Supplemental Discharge Instructions for  Pacemaker/Defibrillator Patients  Activity No heavy lifting or vigorous activity with your left/right arm for 6 to 8 weeks.  Do not raise your left/right arm above your head for one week.  Gradually raise your affected arm as drawn below.              05/12/2019                05/13/2019                05/14/2019               05/15/2019 __  NO DRIVING for 6 MONTHS.  WOUND CARE - Keep the wound area clean and dry.  Do not get this area wet, no showers until cleared to at your wound check visit . - The tape/steri-strips on your wound will fall off; do not pull them off.  No bandage is needed on the site.  DO  NOT apply any creams, oils, or ointments to the wound area. - If you notice any drainage or discharge from the wound, any swelling or bruising at the site, or you develop a fever > 101? F after you are discharged home, call the office at once.  Special Instructions - You are still able to use cellular telephones; use the ear opposite the side where you have your pacemaker/defibrillator.  Avoid carrying your cellular phone near your device. - When traveling through airports, show security personnel your identification card to avoid being screened in the metal detectors.  Ask the security personnel to use the hand wand. - Avoid arc welding equipment, MRI testing (magnetic resonance imaging), TENS units (transcutaneous nerve stimulators).  Call the office for questions about other devices. - Avoid electrical appliances that are in poor condition or are not properly  grounded. - Microwave ovens are safe to be near or to operate.  Additional information for defibrillator patients should your device go off: - If your device goes off ONCE and you feel fine afterward, notify the device clinic nurses. - If your device goes off ONCE and you do not feel well afterward, call 911. - If your device goes off TWICE, call 911. - If your device goes off THREE times in one day, call 911.  DO NOT DRIVE YOURSELF OR A FAMILY MEMBER WITH A DEFIBRILLATOR TO THE HOSPITAL--CALL 911.  _______________  YOUR CARDIOLOGY TEAM HAS ARRANGED FOR AN E-VISIT FOR YOUR APPOINTMENT - PLEASE REVIEW IMPORTANT INFORMATION BELOW SEVERAL DAYS PRIOR TO YOUR APPOINTMENT  Due to the recent COVID-19 pandemic, we are transitioning in-person office visits to tele-medicine visits in an effort to decrease unnecessary exposure to our patients, their families, and staff. These visits are billed to your insurance just like a normal visit is. We also encourage you to sign up for MyChart if you have not already done so. You will need a smartphone if possible. For patients that do not have this, we can still complete the visit using a regular telephone but do prefer a smartphone to enable video when possible. You may have a family member that lives with you that  can help. If possible, we also ask that you have a blood pressure cuff and scale at home to measure your blood pressure, heart rate and weight prior to your scheduled appointment. Patients with clinical needs that need an in-person evaluation and testing will still be able to come to the office if absolutely necessary. If you have any questions, feel free to call our office.   YOUR PROVIDER WILL BE USING ONE OF THE FOLLOWING PLATFORM TO COMPLETE YOUR VISIT: MyChart/[email or web address] (our office will you a couple of days before your visit with more information)   IF USING MYCHART - How to Download the MyChart App to Your SmartPhone   - If Apple, go to  [employer] and type in MyChart in the search bar and download the app. If Android, ask patient to go to [employer] and type in [place] in the search bar and download the app. The app is free but as with any other app downloads, your phone may require you to verify saved payment information or Apple/Android password.  - You will need to then log into the app with your MyChart username and password, and select [place] as your healthcare provider to link the account.  - When it is time for your visit, go to the MyChart app, find appointments, and click Begin Video Visit. Be sure to Select Allow for your device to access the Microphone and Camera for your visit. You will then be connected, and your provider will be with you shortly.  **If you have any issues connecting or need assistance, please contact MyChart service desk [phone])**  **If using a computer, in order to ensure the best quality for your visit, you will need to use either of the following Internet Browsers: Agricultural consultant or Microsoft Edge**   IF USING DOXIMITY or [email or web address] - The staff will give you instructions on receiving your link to join the meeting the day of your visit.   2-3 DAYS BEFORE YOUR APPOINTMENT  You will receive a telephone call from one of our [facility] team members - your caller ID may say "Unknown caller." If this is a video visit, we will walk you through how to get the video launched on your phone. We will remind you check your blood pressure, heart rate and weight prior to your scheduled appointment. If you have an Apple Watch or Kardia, please upload any pertinent ECG strips the day before or morning of your appointment to MyChart. Our staff will also make sure you have reviewed the consent and agree to move forward with your scheduled tele-health visit.     THE DAY OF YOUR APPOINTMENT  Approximately 15 minutes prior to your scheduled appointment, you will receive a telephone call from  one of HeartCare team - your caller ID may say "Unknown caller."  Our staff will confirm medications, vital signs for the day and any symptoms you may be experiencing. Please have this information available prior to the time of visit start. It may also be helpful for you to have a pad of paper and pen handy for any instructions given during your visit. They will also walk you through joining the smartphone meeting if this is a video visit.  CONSENT FOR TELE-HEALTH VISIT - PLEASE REVIEW  I hereby voluntarily request, consent and authorize [facility] and its employed or contracted physicians, physician assistants, nurse practitioners or other licensed health care professionals (the Practitioner), to provide me with telemedicine health care services (the Services") as  deemed necessary by the treating Practitioner. I acknowledge and consent to receive the Services by the Practitioner via telemedicine. I understand that the telemedicine visit will involve communicating with the Practitioner through live audiovisual communication technology and the disclosure of certain medical information by electronic transmission. I acknowledge that I have been given the opportunity to request an in-person assessment or other available alternative prior to the telemedicine visit and am voluntarily participating in the telemedicine visit.  I understand that I have the right to withhold or withdraw my consent to the use of telemedicine in the course of my care at any time, without affecting my right to future care or treatment, and that the Practitioner or I may terminate the telemedicine visit at any time. I understand that I have the right to inspect all information obtained and/or recorded in the course of the telemedicine visit and may receive copies of available information for a reasonable fee.  I understand that some of the potential risks of receiving the Services via telemedicine include:   Delay or interruption in  medical evaluation due to technological equipment failure or disruption;  Information transmitted may not be sufficient (e.g. poor resolution of images) to allow for appropriate medical decision making by the Practitioner; and/or   In rare instances, security protocols could fail, causing a breach of personal health information.  Furthermore, I acknowledge that it is my responsibility to provide information about my medical history, conditions and care that is complete and accurate to the best of my ability. I acknowledge that Practitioner's advice, recommendations, and/or decision may be based on factors not within their control, such as incomplete or inaccurate data provided by me or distortions of diagnostic images or specimens that may result from electronic transmissions. I understand that the practice of medicine is not an exact science and that Practitioner makes no warranties or guarantees regarding treatment outcomes. I acknowledge that I will receive a copy of this consent concurrently upon execution via email to the email address I last provided but may also request a printed copy by calling the office of [facility].    I understand that my insurance will be billed for this visit.   I have read or had this consent read to me.  I understand the contents of this consent, which adequately explains the benefits and risks of the Services being provided via telemedicine.   I have been provided ample opportunity to ask questions regarding this consent and the Services and have had my questions answered to my satisfaction.  I give my informed consent for the services to be provided through the use of telemedicine in my medical care  By participating in this telemedicine visit I agree to the above.

## 2019-05-10 NOTE — Progress Notes (Signed)
Doing very well toda Will plan to discharge to home No driving x 6 months (pt aware)  Routine wound care and follow-up Amiodarone as outlined in prior notes.

## 2019-05-10 NOTE — Discharge Summary (Addendum)
Discharge Summary    Patient ID: Russell [name]. MRN: [id number]; DOB: Apr 25, 1951  Admit date: 05/07/2019 Discharge date: 05/10/2019  Primary Care Provider: Smitty Cords, DO  Primary Cardiologist: Hillis Range, MD  Primary Electrophysiologist:  None   Discharge Diagnoses    Principal Problem:   Cardiac arrest [facility]) Active Problems:   Essential hypertension   Type 2 diabetes mellitus with other specified complication [facility])   Ventricular tachycardia (HCC)   Chronic systolic heart failure (HCC)   ICD (implantable cardioverter-defibrillator) in place   Nonischemic cardiomyopathy (HCC)   Allergies No Known Allergies  Diagnostic Studies/Procedures    Echocardiogram 05/04/2019:  1. The left ventricle has severely reduced systolic function, with an ejection fraction of 20-25%. The cavity size was moderately dilated. There is mildly increased left ventricular wall thickness. Left ventricular diastolic Doppler parameters are  consistent with impaired relaxation.  2. Aortic valve regurgitation is trivial by color flow Doppler. No stenosis of the aortic valve.  3. The interatrial septum was not assessed.  _______________  Right/Left Cardiac Catheterization 05/07/2019:  Conclusions:  1. Mild luminal irregularities in the LAD. No angiographically significant coronary artery disease.  2. Normal left and right heart filling pressures.  3. Severely reduced left ventricular systolic function (LVEF 25-30%).    Recommendations:  1. Optimize evidence-based heart failure therapy.  2. Continue IV amiodarone.  3. Case discussed with electrophysiology at [facility]. We will plan for transfer later today for formal EP evaluation and possible ICD placement later this week.  _______________  ICD Implantation 05/08/2019:  Pre-Procedure Diagnoses: 1. Nonischemic cardiomyopathy.  2. New York Heart Association class II, heart failure chronically.  3. Sudden cardiac arrest with  hemodynamically unstable ventricular tachycardia   Post-Procedure Diagnoses: 1. Nonischemic cardiomyopathy.  2. New York Heart Association class II, heart failure chronically.  3. Sudden cardiac arrest with hemodynamically unstable ventricular tachycardia    Procedures:  1. ICD implantation.  2. Left upper extremity venography  3. Defibrillation threshold testing   Conclusions: 1. Aborted sudden cardiac arrest with pulse ventricular tachycardia documented at 230 bpm requiring defibrillation in the field, with subsequent recovery  2. Successful ICD implantation for secondary prevention of sudden death with a Medtronic model [id number] Visa AF MRI VR Surescan (serial Number [id number]) ICD.   3. DFT less than or equal to 20 joules.  4. No early apparent complications.     History of Present Illness     Russell [name]. is a 68 year old male with history of chronic systolic CHF/non-ischemic cardiomyopathy with EF of 20-25%, frequent PVCs, hypertension, and type 2 diabetes mellitus, who is followed by Dr. Mariah Milling. Patient last saw Dr. Mariah Milling in 06/2018 at which time he was doing well from a cardiac standpoint.   On 05/03/2019, patient was reportedly out playing baseball when he suddenly developed chest pain, collapsed, and became unresponsive. Bystander started CPR prior to EMS arrival. Upon EMS arrival, patient was in ventricular fibrillation and required 5 shocks while in the field with subsequent ROSC. He was intubated while still in the field by EMS and given for milligrams of Versed in route to the hospital. Total downtime estimated at approximately 5 minutes. At the time of his arrival to [facility] ED, he was moving his extremities; however, he had no purposeful movements. He was admitted admitted to the ICU for cardiac arrest secondary to VT and started on Dopamine and gentle IV hydration to maintain mean arterial blood pressure greater than 65%. He underwent targeted temperature  management with change from 33 degrees protocol to 36 degrees protocol.  Troponin peaked at 0.69.Significant imaging included Chest CTA which showed no aortic aneurysm, dissection, or evidence of central pulmonary arterial embolus. However, a minimally displaced fracture of the anterior right fourth rib and nondisplaced fracture of the sternal body were noted. CT head was negative for acute intracranial abnormality. COVID-19 testing performed and negative.    He was successfully extubated but had recurrent sustained VT on 05/06/2019 with successful defibrillation and return to baseline mental status. He was started on Amiodarone infusion and remained in sinus rhythm. On 05/07/2019, he underwent right and left heart catheterization with mild luminal irregularities in the LAD but no significant obstructive CAD. Study also showed normal left and right heart filling pressures but severely reduced LVEF (20-25%) with global hypokinesis were also noted. Case was discussed with [facility] EP at [facility] with recommendation for transfer for further evaluation and formal consideration of ICD implantation. Transfer was set up through [facility] and EMTALA form completed.   Hospital Course     Consultants: Advanced Heart Failure Team  VT Arrest  Patient was transferred to [facility] on 05/07/2019 for further evaluation and consideration of ICD implantation as stated above. Patient was seen by EP team and ICD implantation was recommended for secondary prevention of sudden death.. Patient underwent successful ICD implantation on 05/08/2019. He tolerated the procedure well and there were no immediate complications. Device check on the 05/09/2019 was forgone per Dr. Johney Frame. However, chest x-ray on 05/09/2019 showed stable lead position and no evidence of pneumothorax. Patient was transitioned to PO Amiodarone. - Continue PO Amiodarone  twice daily for 1 week, then  daily for 4 weeks, and then   daily.  - Continue Coreg  twice daily.  - No driving for 6 months per [place] law. Patient aware. - Wound check and EP follow-up appointment have been arranged.   Non-Ischemic Cardiomyopathy  Patient was seen by Advanced Heart Failure team during hospitalization. He was noted to have well-compensated hemodynamics on cardiac catheterization on 05/07/2019 and he was not felt to be volume overloaded on exam. He was started on aggressive medical management of his cardiomyopathy. He was transitioned from Losartan to [facility]. He did not get cMRI prior to ICD given chronicity of cardiomyopathy. He is not a CRT candidate with narrow QRS.  - Continue Entresto 24-26mg  twice daily.  - Continue Coreg  twice daily.  - Continue Spironolactone  daily.   Hypertension  - BP ranging from the 110's/90's to 150's/100's. - Continue Entresto, Coreg, and Spironolactone as above.   - Will restart home Amlodipine at discharge but at reduced dose of  daily. May need to increase back to  daily at follow-up.  Type 2 Diabetes Mellitus - Resume home Metformin  daily.  Rib/Sternal Body Fracture CT at [facility] showed  minimally displaced fracture of the anterior right fourth rib and nondisplaced fracture of the sternal body were noted. Likely secondary to syncope caused by cardaic arrest and/or CPR. Patient denies much (if any) pain.   Patient seen and examined by Dr. Johney Frame today and determined to be stable for discharge. Outpatient follow-up has been arranged. Medications as below.  _____________  Discharge Vitals Blood pressure (!) 139/96, pulse 93, temperature 98.9 F (37.2 C), temperature source Oral, resp. rate 14, weight 66.4 kg, SpO2 98 %.  Filed Weights   05/08/19 0323 05/09/19 0346 05/10/19 0515  Weight: 65.8 kg 67.4 kg 66.4 kg    Labs &  Radiologic Studies    CBC Recent Labs    05/09/19 0437 05/10/19 0335  WBC 11.5* 11.6*  NEUTROABS 8.4* 8.2*  HGB 15.3 14.4  HCT 44.5 41.7  MCV  85.6 86.5  PLT 185 167   Basic Metabolic Panel Recent Labs    79/03/83 0437 05/10/19 0335  NA 137 139  K 3.7 4.1  CL 104 107  CO2 21* 22  GLUCOSE 170* 133*  BUN 10 17  CREATININE 0.97 1.04  CALCIUM 8.5* 8.8*  MG 2.0  --    Liver Function Tests Recent Labs    05/09/19 0437  AST 33  ALT 43  ALKPHOS 60  BILITOT 1.4*  PROT 6.8  ALBUMIN 2.9*   No results for input(s): LIPASE, AMYLASE in the last 72 hours. Cardiac Enzymes No results for input(s): CKTOTAL, CKMB, CKMBINDEX, TROPONINI in the last 72 hours. BNP Invalid input(s): POCBNP D-Dimer No results for input(s): DDIMER in the last 72 hours. Hemoglobin A1C No results for input(s): HGBA1C in the last 72 hours. Fasting Lipid Panel No results for input(s): CHOL, HDL, LDLCALC, TRIG, CHOLHDL, LDLDIRECT in the last 72 hours. Thyroid Function Tests Recent Labs    05/09/19 0437  TSH 2.334   _____________  Dg Chest 2 View  Result Date: 05/09/2019 CLINICAL DATA:  68 year old male status post cardiac device placement. EXAM: CHEST - 2 VIEW COMPARISON:  05/05/2019 and earlier. FINDINGS: PA and lateral views. New left chest cardiac AICD. No pneumothorax. Stable cardiac size and mediastinal contours. The lead courses to the RV apex region. Worsening ventilation at the right lung base appears related to small volume pleural effusion and adjacent airspace disease. There is no pulmonary edema. No other confluent opacity. No acute osseous abnormality identified. Negative visible bowel gas pattern. IMPRESSION: 1. Left chest cardiac AICD placed with no adverse features. 2. Worsening ventilation at the right lung base with small pleural effusion and probable atelectasis. Electronically Signed   By: Odessa Fleming M.D.   On: 05/09/2019 08:34   Dg Abd 1 View  Result Date: 05/04/2019 CLINICAL DATA:  NG tube placement EXAM: ABDOMEN - 1 VIEW COMPARISON:  Radiograph 05/03/2019 FINDINGS: Nasogastric tube extends the stomach. Side port below the GE  junction. Normal bowel-gas pattern. Lung bases clear. IMPRESSION: NG tube in proper position. Electronically Signed   By: Genevive Bi M.D.   On: 05/04/2019 05:33   Ct Head Wo Contrast  Result Date: 05/03/2019 CLINICAL DATA:  68 y/o M; altered level of consciousness. Witnessed cardiac arrest, down approximately 4-6 minutes, CPR. EXAM: CT HEAD WITHOUT CONTRAST TECHNIQUE: Contiguous axial images were obtained from the base of the skull through the vertex without intravenous contrast. COMPARISON:  None. FINDINGS: Brain: No evidence of acute infarction, hemorrhage, hydrocephalus, extra-axial collection or mass lesion/mass effect. Few nonspecific white matter hypodensities are compatible with mild chronic microvascular ischemic changes and there is mild volume loss of the brain. Vascular: Calcific atherosclerosis of internal carotid arteries. No hyperdense vessel identified. Skull: Normal. Negative for fracture or focal lesion. Sinuses/Orbits: No acute finding. Other: None. IMPRESSION: 1. No acute intracranial abnormality identified. 2. Mild chronic microvascular ischemic changes and volume loss of the brain. Electronically Signed   By: Mitzi Hansen M.D.   On: 05/03/2019 23:38   Dg Chest Port 1 View  Result Date: 05/05/2019 CLINICAL DATA:  Acute respiratory failure. EXAM: PORTABLE CHEST 1 VIEW COMPARISON:  Radiographs of May 03, 2019. FINDINGS: Stable cardiomediastinal silhouette. Endotracheal and nasogastric tubes are unchanged in position. No pneumothorax or  pleural effusion is noted. No acute pulmonary disease is noted. Bony thorax is unremarkable. IMPRESSION: Stable support apparatus. No acute cardiopulmonary abnormality seen. Electronically Signed   By: Lupita RaiderJames  Green Jr M.D.   On: 05/05/2019 07:29   Dg Chest Port 1 View  Result Date: 05/03/2019 CLINICAL DATA:  68 y/o  M; cardiac arrest and intubation. EXAM: PORTABLE CHEST 1 VIEW COMPARISON:  11/13/2013 chest radiograph. FINDINGS: Stable  cardiac silhouette given projection and technique. Endotracheal tube tip projects 4.1 cm above the carina. Enteric tube tip extends below the field of view into the abdomen. Transcutaneous pacing pads. Diffuse pulmonary vascular congestion. No focal consolidation. No pleural effusion or pneumothorax. No acute osseous abnormality is evident. IMPRESSION: Endotracheal tube tip projects 4.1 cm above the carina. Enteric tube tip extends below the field of view into the abdomen. Diffuse pulmonary vascular congestion. Electronically Signed   By: Mitzi HansenLance  Furusawa-Stratton M.D.   On: 05/03/2019 22:47   Ct Angio Chest/abd/pel For Dissection W And/or W/wo  Result Date: 05/04/2019 CLINICAL DATA:  68 year old male with diabetes and vascular disease. Evaluate for aortic dissection or pulmonary embolism. EXAM: CT ANGIOGRAPHY CHEST, ABDOMEN AND PELVIS TECHNIQUE: Multidetector CT imaging through the chest, abdomen and pelvis was performed using the standard protocol during bolus administration of intravenous contrast. Multiplanar reconstructed images and MIPs were obtained and reviewed to evaluate the vascular anatomy. CONTRAST:  100mL ISOVUE-370 IOPAMIDOL (ISOVUE-370) INJECTION 76% COMPARISON:  Chest radiograph dated 05/03/2019 FINDINGS: Evaluation is limited due to streak artifact caused by patient's arms. CTA CHEST FINDINGS Cardiovascular: There is moderate cardiomegaly. No pericardial effusion. The thoracic aorta is unremarkable. The visualized origins of the great vessels of the aortic arch appear patent. The central pulmonary arteries are patent. Evaluation of the distal pulmonary arteries is limited due to suboptimal opacification. No large or central pulmonary artery embolus identified. Mediastinum/Nodes: No hilar or mediastinal adenopathy. An enteric tube is noted within the esophagus. The thyroid gland is grossly unremarkable. No mediastinal fluid collection. Lungs/Pleura: Endotracheal tube with tip approximately 2 cm  above the carina. Patchy bilateral lower lobe consolidative changes may represent atelectasis versus infiltrate. There is no pleural effusion or pneumothorax. The central airways are patent. Musculoskeletal: Minimally displaced fracture of the anterior right fourth rib. Age indeterminate, likely acute, nondisplaced and incomplete fracture of the sternal body. Review of the MIP images confirms the above findings. CTA ABDOMEN AND PELVIS FINDINGS VASCULAR Aorta: Normal caliber aorta without aneurysm, dissection, vasculitis or significant stenosis. Celiac: Patent without evidence of aneurysm, dissection, vasculitis or significant stenosis. SMA: Patent without evidence of aneurysm, dissection, vasculitis or significant stenosis. Renals: Both renal arteries are patent without evidence of aneurysm, dissection, vasculitis, fibromuscular dysplasia or significant stenosis. IMA: Patent without evidence of aneurysm, dissection, vasculitis or significant stenosis. Inflow: Patent without evidence of aneurysm, dissection, vasculitis or significant stenosis. Veins: No obvious venous abnormality within the limitations of this arterial phase study. Air within the left common femoral vein, likely iatrogenic and related to intravenous access. Review of the MIP images confirms the above findings. NON-VASCULAR No intra-abdominal free air or free fluid. Hepatobiliary: The liver is grossly unremarkable. The gallbladder is partially contracted. No calcified gallstone. Thickened appearance of the gallbladder wall, likely partially related to underdistention. Pancreas: Unremarkable. No pancreatic ductal dilatation or surrounding inflammatory changes. Spleen: Normal in size without focal abnormality. Adrenals/Urinary Tract: Adrenal glands are unremarkable. Kidneys are normal, without renal calculi, focal lesion, or hydronephrosis. The urinary bladder is decompressed around a Foley catheter. Stomach/Bowel: An enteric tube is  noted with tip in  the proximal stomach. There is no bowel obstruction. The appendix is unremarkable as visualized. Lymphatic: No adenopathy. Reproductive: The prostate and seminal vesicles are grossly unremarkable. Other: Small fat containing umbilical hernia. Musculoskeletal: No acute or significant osseous findings. Review of the MIP images confirms the above findings. IMPRESSION: 1. No aortic aneurysm or dissection. No CT evidence of central pulmonary artery embolus. 2. Minimally displaced fracture of the anterior right fourth rib and nondisplaced fracture of the sternal body. 3. Moderate cardiomegaly. 4. Bilateral lower lobe consolidative changes may represent atelectasis versus infiltrate. 5. No bowel obstruction or active inflammation. Electronically Signed   By: Elgie Collard M.D.   On: 05/04/2019 00:56   Disposition   Patient is being discharged home today in good condition.  Follow-up Plans & Appointments    Follow-up Information    [facility] Office Follow up.   Specialty:  Cardiology Why:  05/20/2019 @ 11:30AM, wound check visit Contact information: [address], [address] [place] [address] [phone]       Hillis Range, MD Follow up.   Specialty:  Cardiology Why:  08/11/2019 @ 9:45AM Contact information: [address] [place] [address] [phone]        Antonieta Iba, MD Follow up.   Specialty:  Cardiology Why:  06/02/2019 @ 10:40AM, given COVID19, this is currently scheduled as a virtual visit, please see your discharge instructions Contact information: [address] [place] [address] [phone]        Smitty Cords, DO Follow up.   Specialty:  Family Medicine Why:  Please call you primary care physician's office to schedule a follow-up visit within the next 1-2 weeks.  Contact information: [address] [place] [address] [phone]          Discharge Instructions    Diet -  low sodium heart healthy   Complete by:  As directed    Increase activity slowly   Complete by:  As directed       Discharge Medications   Allergies as of 05/10/2019   No Known Allergies     Medication List    STOP taking these medications   amiodarone 360-4.14 MG/200ML-% Soln Commonly known as:  NEXTERONE PREMIX   dorzolamide-timolol 22.3-6.8 MG/ML ophthalmic solution Commonly known as:  COSOPT   losartan 50 MG tablet Commonly known as:  COZAAR   mupirocin ointment 2 % Commonly known as:  BACTROBAN   TYLENOL PO     TAKE these medications   amiodarone 400 MG tablet Commonly known as:  PACERONE Take  twice daily through 05/14/2019. Then starting on 05/15/2019, take  daily for 4 weeks.   amiodarone 200 MG tablet Commonly known as:  Pacerone Take 1 tablet (200 mg total) by mouth daily. Start taking on:  June 12, 2019   amLODipine 10 MG tablet Commonly known as:  NORVASC Take 0.5 tablets (5 mg total) by mouth daily. What changed:  how much to take   aspirin EC 81 MG tablet Take 1 tablet (81 mg total) by mouth daily.   carvedilol 25 MG tablet Commonly known as:  COREG Take 1 tablet (25 mg total) by mouth 2 (two) times daily with a meal. What changed:    medication strength  how much to take  when to take this   cetirizine 10 MG tablet Commonly known as:  ZYRTEC Take 10 mg by mouth daily.  latanoprost 0.005 % ophthalmic solution Commonly known as:  XALATAN Place 1 drop into both eyes at bedtime.   metFORMIN 500 MG tablet Commonly known as:  Glucophage Take 1 tablet (500 mg total) by mouth daily with breakfast.   nitroGLYCERIN 0.4 MG SL tablet Commonly known as:  NITROSTAT Place 1 tablet (0.4 mg total) under the tongue every 5 (five) minutes x 3 doses as needed for chest pain.   polyvinyl alcohol 1.4 % ophthalmic solution Commonly known as:  LIQUIFILM TEARS Place 2 drops into both eyes every 8 (eight) hours.   sacubitril-valsartan 24-26  MG Commonly known as:  ENTRESTO Take 1 tablet by mouth 2 (two) times daily.   spironolactone 25 MG tablet Commonly known as:  ALDACTONE Take 1 tablet (25 mg total) by mouth daily.   timolol 0.5 % ophthalmic solution Commonly known as:  TIMOPTIC Place 1 drop into both eyes 2 (two) times daily.   Vitamin D3 50 MCG (2000 UT) Tabs Take by mouth.        Acute coronary syndrome (MI, NSTEMI, STEMI, etc) this admission?:  No.  The elevated Troponin was due to the acute medical illness or demand ischemia.    Outstanding Labs/Studies   None.  Duration of Discharge Encounter   Greater than 30 minutes including physician time.  Signed, Corrin Parker, PA-C 05/10/2019, 9:31 AM

## 2019-05-10 NOTE — Care Management (Signed)
Pt given Entresto cards.  Discussed copay and instructions to use cards.

## 2019-05-10 NOTE — Telephone Encounter (Signed)
   TELEPHONE CALL NOTE  This patient has been deemed a candidate for follow-up tele-health visit to limit community exposure during the Covid-19 pandemic. I spoke with the patient via phone to discuss instructions. This has been outlined on the patient's AVS (dotphrase: hcevisitinfo). The patient was advised to review the section on consent for treatment as well. The patient will receive a phone call 2-3 days prior to their E-Visit at which time consent will be verbally confirmed. A Virtual Office Visit appointment type has been scheduled for 06/02/2019 with Dr. Mariah Milling, with "VIDEO" or "TELEPHONE" in the appointment notes - patient prefers video type.  Corrin Parker, PA-C 05/10/2019 9:29 AM

## 2019-05-11 ENCOUNTER — Other Ambulatory Visit: Payer: Self-pay | Admitting: Student

## 2019-05-11 DIAGNOSIS — I5022 Chronic systolic (congestive) heart failure: Secondary | ICD-10-CM

## 2019-05-11 MED ORDER — SPIRONOLACTONE 25 MG PO TABS: 25.0000 mg | ORAL_TABLET | Freq: Every day | ORAL | 2 refills | Status: DC

## 2019-05-11 NOTE — Progress Notes (Signed)
   Patient's wife called RN on 6E and said Spironolactone 25mg  daily (prescribed at discharge) had not been sent to pharmacy. Resent prescription to [facility] pharmacy at patient's request.   Corrin Parker, PA-C 05/11/2019 10:13 AM

## 2019-05-13 LAB — GLUCOSE, CAPILLARY
Glucose-Capillary: 122 mg/dL — ABNORMAL HIGH (ref 70–99)
Glucose-Capillary: 178 mg/dL — ABNORMAL HIGH (ref 70–99)

## 2019-05-14 ENCOUNTER — Other Ambulatory Visit: Payer: Self-pay

## 2019-05-14 DIAGNOSIS — E1169 Type 2 diabetes mellitus with other specified complication: Secondary | ICD-10-CM

## 2019-05-14 MED ORDER — ONETOUCH ULTRASOFT LANCETS MISC: 12 refills | Status: DC

## 2019-05-14 MED ORDER — GLUCOSE BLOOD VI STRP: ORAL_STRIP | 12 refills | Status: DC

## 2019-05-14 NOTE — Telephone Encounter (Signed)
The pt called requesting a a prescription for his testing strips and lancets to be sent over to [facility].

## 2019-05-15 ENCOUNTER — Ambulatory Visit: Payer: BLUE CROSS/BLUE SHIELD | Admitting: Family Medicine

## 2019-05-16 ENCOUNTER — Telehealth: Payer: Self-pay | Admitting: Family Medicine

## 2019-05-16 NOTE — Telephone Encounter (Signed)
I spoke with the patient wife and patient. They confirmed that it was his blood sugar that was 180 fasting.

## 2019-05-16 NOTE — Telephone Encounter (Signed)
Can you clarify if this is BP or Blood Sugar?  He has hospital f/u with me on 05/22/19. Most likely I would recommend that he bring in readings of both sugar and pressure and we can review at that time.  If it is blood sugar, that is no problem, elevated blood sugar 150-200 should not cause any short term complications. We will discuss further.  If acutely elevated blood pressure >180/110 and symptoms of headache or other concerns, needs to seek care more immediately at hospital especially if significant chest pain that does not go away within 30 minutes, is accompanied by nausea, sweating, shortness of breath, or made worse by activity.  Saralyn Pilar, DO [facility] 05/16/2019, 11:54 AM

## 2019-05-16 NOTE — Telephone Encounter (Signed)
Acknowledged. Follow up as planned  Saralyn Pilar, DO [facility] 05/16/2019, 4:30 PM

## 2019-05-16 NOTE — Telephone Encounter (Signed)
Rosey Bath with  [facility]  [phone] said that pt  Wife called to report that BP is running 180  Fasting .

## 2019-05-20 ENCOUNTER — Other Ambulatory Visit: Payer: Self-pay

## 2019-05-20 ENCOUNTER — Ambulatory Visit (INDEPENDENT_AMBULATORY_CARE_PROVIDER_SITE_OTHER): Payer: BLUE CROSS/BLUE SHIELD | Admitting: *Deleted

## 2019-05-20 DIAGNOSIS — I5022 Chronic systolic (congestive) heart failure: Secondary | ICD-10-CM

## 2019-05-20 LAB — CUP PACEART INCLINIC DEVICE CHECK
Battery Remaining Longevity: 136 mo
Battery Voltage: 3.12 V
Brady Statistic RV Percent Paced: 0 %
Date Time Interrogation Session: 20200526124843
HighPow Impedance: 60 Ohm
Implantable Lead Implant Date: 20200514
Implantable Lead Location: 753860
Implantable Lead Model: 6935
Implantable Pulse Generator Implant Date: 20200514
Lead Channel Impedance Value: 380 Ohm
Lead Channel Impedance Value: 456 Ohm
Lead Channel Pacing Threshold Amplitude: 0.75 V
Lead Channel Pacing Threshold Pulse Width: 0.4 ms
Lead Channel Sensing Intrinsic Amplitude: 12.25 mV
Lead Channel Setting Pacing Amplitude: 3.5 V
Lead Channel Setting Pacing Pulse Width: 0.4 ms
Lead Channel Setting Sensing Sensitivity: 0.3 mV

## 2019-05-20 NOTE — Progress Notes (Signed)
Wound check appointment. Steri-strips removed. Wound without redness or edema. Incision edges approximated, wound healing well. Normal device function. Thresholds, sensing, and impedances consistent with implant measurements. Device programmed at 3.5V for extra safety margin until 3 month visit. Histogram distribution appropriate for patient and level of activity. No mode switches or ventricular arrhythmias noted. Patient educated about wound care, arm mobility, lifting restrictions, shock plan, and home monitor. ROV 08/11/19 with JA. Remote checks scheduled every 3 months.

## 2019-05-21 ENCOUNTER — Telehealth: Payer: Self-pay | Admitting: *Deleted

## 2019-05-21 NOTE — Telephone Encounter (Signed)
Error

## 2019-05-21 NOTE — Telephone Encounter (Signed)
Placed forms for physician to sign in nurse's box

## 2019-05-22 ENCOUNTER — Other Ambulatory Visit: Payer: Self-pay

## 2019-05-22 ENCOUNTER — Encounter: Payer: Self-pay | Admitting: Family Medicine

## 2019-05-22 ENCOUNTER — Ambulatory Visit (INDEPENDENT_AMBULATORY_CARE_PROVIDER_SITE_OTHER): Payer: BLUE CROSS/BLUE SHIELD | Admitting: Family Medicine

## 2019-05-22 VITALS — BP 111/68 | HR 71 | Temp 98.9°F | Resp 16 | Ht 68.0 in | Wt 147.6 lb

## 2019-05-22 DIAGNOSIS — E1169 Type 2 diabetes mellitus with other specified complication: Secondary | ICD-10-CM | POA: Diagnosis not present

## 2019-05-22 DIAGNOSIS — I428 Other cardiomyopathies: Secondary | ICD-10-CM

## 2019-05-22 DIAGNOSIS — I5022 Chronic systolic (congestive) heart failure: Secondary | ICD-10-CM

## 2019-05-22 DIAGNOSIS — Z95810 Presence of automatic (implantable) cardiac defibrillator: Secondary | ICD-10-CM | POA: Diagnosis not present

## 2019-05-22 NOTE — Progress Notes (Signed)
Subjective:    Patient ID: Russell Joy., male    DOB: November 27, 1951, 68 y.o.   MRN: [id number]  Russell Corporon. is a 68 y.o. male presenting on 05/22/2019 for Hospitalization Follow-up (Cardiac arrest [facility]))   HPI  HOSPITAL FOLLOW-UP VISIT  Hospital/Location: [facility] Date of Admission: 05/03/19 Date of Transfer to [facility] Hospital: 05/07/19 Date of Discharge: 05/10/19 Transitions of care telephone call: Not completed.  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 Brief summary of recent course, patient had symptoms of sudden cardiac arrest and hospitalized in ICU, determined due to VTach, required resuscitation, suffered rib/sternal fracture displaced, treated in ICU by cardiology / EP, ultimately had recurrent VTach required ICD placement, new diagnosis non-ischemic cardiomyopathy without significant CAD blockage, determined to have Systolic CHF, treated on med management as well, and discharged with outpatient follow-up.  - Today reports overall has done well after discharge. Symptoms of cardiac arrest / chest pain have resolved. No recurrent episodes of chest pain, dyspnea or syncope or arrhythmia that he is aware of. He has not noticed any ICD firing. He has had some residual soreness in ribs from the CPR with mild fractures  Followed by Dr Mariah Milling, and Dr Johney Frame (EP) for ICD. Next apt with Dr Mariah Milling on 06/02/19.  He is not allowed to drive for 6 months due to ICD.  He is off work on [employer], since 05/05/19. His wife is primary caregiver for him at this time after his hospitalization. He has required more assistance at home with activities, he has some  residual fatigue that is gradually improving. He is on new medication and needs assistance with these, and needs her help to provide transportation to doctors visits.  He is improving diet, low salt diet  His blood sugar readings were mildly elevated after hospital, now improving. Cardiology recommended resume his Metformin same dose as before.   - New medications on discharge: See list.  - Episode occurred Saturday 5/9 - Missed Monday 5/11 first day missed  I have reviewed the discharge medication list, and have reconciled the current and discharge medications today.   Current Outpatient Medications:    [START ON 06/12/2019] amiodarone (PACERONE) 200 MG tablet, Take 1 tablet (200 mg total) by mouth daily., Disp: 30 tablet, Rfl: 2   amLODipine (NORVASC) 10 MG tablet, Take 0.5 tablets (5 mg total) by mouth daily., Disp: 90 tablet, Rfl: 3   aspirin EC 81 MG tablet, Take 1 tablet (81 mg total) by mouth daily., Disp: , Rfl:    carvedilol (COREG) 25 MG tablet, Take 1 tablet (25 mg total) by mouth 2 (two) times daily with a meal., Disp: 60 tablet, Rfl: 2   cetirizine (ZYRTEC) 10 MG tablet, Take 10 mg by mouth daily., Disp: , Rfl: 4   Cholecalciferol (VITAMIN D3) 2000 units TABS, Take by mouth., Disp: , Rfl:    glucose blood test strip, Use as instructed to check blood sugar up to x 2 daily, Disp: 100 each, Rfl: 12   Lancets (ONETOUCH ULTRASOFT) lancets, Use as instructed to check blood sugar up to x 2 daily, Disp: 100 each, Rfl: 12   latanoprost (XALATAN) 0.005 % ophthalmic solution, Place 1 drop into both eyes at bedtime. ,  Disp: , Rfl:    metFORMIN (GLUCOPHAGE) 500 MG tablet, Take 1 tablet (500 mg total) by mouth daily with breakfast., Disp: , Rfl:    nitroGLYCERIN (NITROSTAT) 0.4 MG SL tablet, Place 1 tablet (0.4 mg total) under the tongue every 5 (five) minutes x 3 doses as needed for chest pain., Disp: 30 tablet, Rfl: 2   polyvinyl alcohol (LIQUIFILM TEARS) 1.4 % ophthalmic  solution, Place 2 drops into both eyes every 8 (eight) hours., Disp: 15 mL, Rfl: 0   sacubitril-valsartan (ENTRESTO) 24-26 MG, Take 1 tablet by mouth 2 (two) times daily., Disp: 60 tablet, Rfl: 2   spironolactone (ALDACTONE) 25 MG tablet, Take 1 tablet (25 mg total) by mouth daily., Disp: 30 tablet, Rfl: 2   timolol (TIMOPTIC) 0.5 % ophthalmic solution, Place 1 drop into both eyes 2 (two) times daily. , Disp: , Rfl:    brimonidine (ALPHAGAN) 0.2 % ophthalmic solution, Place 1 drop into both eyes 2 (two) times daily., Disp: , Rfl:   ------------------------------------------------------------------------- Social History   Tobacco Use   Smoking status: Never Smoker   Smokeless tobacco: Never Used  Substance Use Topics   Alcohol use: Yes    Comment: occassional   Drug use: No    Review of Systems Per HPI unless specifically indicated above     Objective:    BP 111/68    Pulse 71    Temp 98.9 F (37.2 C) (Oral)    Resp 16    Ht  (1.727 m)    Wt 147 lb 9.6 oz (67 kg)    BMI 22.44 kg/m   Wt Readings from Last 3 Encounters:  05/22/19 147 lb 9.6 oz (67 kg)  05/10/19 146 lb 6.4 oz (66.4 kg)  05/07/19 157 lb 10.1 oz (71.5 kg)    Physical Exam Vitals signs and nursing note reviewed.  Constitutional:      General: He is not in acute distress.    Appearance: He is well-developed. He is not diaphoretic.     Comments: Well-appearing, comfortable, cooperative  HENT:     Head: Normocephalic and atraumatic.  Eyes:     General:        Right eye: No discharge.        Left eye: No discharge.     Conjunctiva/sclera: Conjunctivae normal.  Neck:     Musculoskeletal: Normal range of motion and neck supple.     Thyroid: No thyromegaly.  Cardiovascular:     Rate and Rhythm: Normal rate and regular rhythm.     Heart sounds: Normal heart sounds. No murmur.  Pulmonary:     Effort: Pulmonary effort is normal. No respiratory distress.     Breath sounds: Normal breath sounds. No  wheezing or rales.  Musculoskeletal: Normal range of motion.  Lymphadenopathy:     Cervical: No cervical adenopathy.  Skin:    General: Skin is warm and dry.     Findings: No erythema or rash.  Neurological:     Mental Status: He is alert and oriented to person, place, and time.  Psychiatric:        Behavior: Behavior normal.     Comments: Well groomed, good eye contact, normal speech and thoughts       Results for orders placed or performed in visit on 05/20/19  CUP PACEART [facility] DEVICE CHECK  Result Value Ref Range   Date Time Interrogation Session [id number]    Pulse Generator Manufacturer MERM    Pulse Gen Model [id number]  Visia AF MRI VR    Pulse Gen Serial Number [id number]    Clinic Name [facility]    Implantable Pulse Generator Type Implantable Cardiac Defibulator    Implantable Pulse Generator Implant Date 11572620    Implantable Lead Manufacturer [facility] [facility]    Implantable Lead Model 6935 Sprint Quattro Secure S MRI SureScan    Implantable Lead Serial Number [id number]    Implantable Lead Implant Date 35597416    Implantable Lead Location Detail 1 APEX    Implantable Lead Location [id number]    Lead Channel Setting Sensing Sensitivity 0.3 mV   Lead Channel Setting Pacing Pulse Width 0.4 ms   Lead Channel Setting Pacing Amplitude 3.5 V   Lead Channel Impedance Value 456 ohm   Lead Channel Impedance Value 380 ohm   Lead Channel Sensing Intrinsic Amplitude 12.25 mV   Lead Channel Pacing Threshold Amplitude 0.75 V   Lead Channel Pacing Threshold Pulse Width 0.4 ms   HighPow Impedance 60 ohm   Battery Status OK    Battery Remaining Longevity 136 mo   Battery Voltage 3.12 V   Brady Statistic RV Percent Paced 0 %   Eval Rhythm VS 67       Assessment & Plan:   Problem List Items Addressed This Visit    Chronic systolic heart failure (HCC) - Primary   ICD (implantable cardioverter-defibrillator) in place   Nonischemic cardiomyopathy (HCC)   Type 2 diabetes mellitus  with other specified complication (HCC)      Clinically improved and stabilized after complex hospitalization with significant newly diagnosed cardiovascular disease with cardiac arrest / VTach recurrent requiring ICD now. - With Non-ischemic cardiomyopathy and Systolic CHF - he is on new medication regimen per cardiology CHF clinic and will f/u with EP Cardiology for his ICD - Currently he remains out of work, needing additional assistance at home, but overall is doing well - Reassurance for chest wall sternal soreness from fractures, no acute concerns, gradual improvement may take several weeks  Regarding diabetes, his sugar have improved now few week from leaving hospital, advised no changes to med at this time, improve lifestyle diet and continue Metformin since he was cleared to keep taking by cardiology. Prefer not to add more new medication at this time.  Completed FMLA paperwork for patient's spouse, Minato Delanuez, for her to provide care for patient as family member, recommended up to 1 month - incapacitated time for patient 05/03/19 - 06/25/19, and then she may resume work with intermittent leave - up to 9-12 hours a day for 2-3 days a week, then if he has flare up of CHF can take 1-3 days per flare up if needed.  Cardiology is completing FMLA for the patient.   No orders of the defined types were placed in this encounter.   Follow up plan: Return in about 3 months (around 08/22/2019) for DM A1c.  Temporarily cancel his annual physical that was scheduled for 06/2019. We will do routine follow-up in 3 months, since he has had all of his blood work done at hospital. We can re-schedule physical in future.  Saralyn Pilar, DO [facility] 05/22/2019, 10:32 AM

## 2019-05-22 NOTE — Telephone Encounter (Signed)
Forms received and placed on Pam A, RN desk to be signed by [facility] when he returns.

## 2019-05-22 NOTE — Telephone Encounter (Signed)
Hey ladies! Do any of you see forms for this patient in the nurse bin?

## 2019-05-22 NOTE — Patient Instructions (Addendum)
Thank you for coming to the office today.  We will complete the paperwork for Russell [name] for her FMLA.  Follow-up with Cardiology as scheduled  Blood sugar is improving. No change at this time.  Please schedule a Follow-up Appointment to: Return in about 3 months (around 08/22/2019) for DM A1c.  If you have any other questions or concerns, please feel free to call the office or send a message through MyChart. You may also schedule an earlier appointment if necessary.  Additionally, you may be receiving a survey about your experience at our office within a few days to 1 week by e-mail or mail. We value your feedback.  Russell Pilar, DO [facility], [place]

## 2019-05-27 ENCOUNTER — Telehealth: Payer: Self-pay

## 2019-05-27 NOTE — Telephone Encounter (Signed)
Patients daughter called about previous call about changing his appointment to a telehealth visit.  She stated that patient cardiac arrested six times in the hospital and he had to be transferred to [facility] for a device implant. She stated that patient needed to come in office to be seen by Dr. Mariah Milling or another provider.  Made her aware that the only opening I had was 06/02/2019 @ 8:20am and patients daughter was agreeable to that.   Made her aware that we have a no visitors policy and patient will need to come through the [facility] entrance. Someone would walk him down to our office.

## 2019-05-27 NOTE — Telephone Encounter (Signed)
Called patient.  Made patient aware that Dr. Mariah Milling was still limiting patients coming into the office due to COVID-19. Patient was very upset that he was unable to come into the office to see Dr. Mariah Milling.  Patient stated that he had medications that needed to be refilled.  I made him aware that I could refill his medications that needed to be if he would provide me with the name.  Patient stated that he is going to be looking for another doctor because he needs to be seen in office and hung up.   Patient stated he called last week sometime and asked to switch providers and that he was told he was unable to switch due to Dr. Mariah Milling having to release him from his care. He mentioned he would like to switch to Dr. Kirke Corin.   Patient did not give me the chance to offer a telephone or video visit.

## 2019-05-28 NOTE — Telephone Encounter (Signed)
Late entry from 05/27/2019 at 5:45 pm. [employer] forms placed on Dr. Windell Hummingbird desk.

## 2019-05-31 NOTE — Progress Notes (Addendum)
Cardiology Office Note  Date:  06/02/2019   ID:  Russell Joy., DOB [birth year], MRN [id number]  PCP:  Smitty Cords, DO   Chief Complaint  Patient presents with  . Other    Patient denies chest pain and SOB at this time. Meds reviewed verbally with patient.     HPI:  [name] is a 68 year old gentleman with a history of  diabetes,  Hypertension, who initially presented for abnormal EKG. he had a colonoscopy. During the procedure, noted to have abnormal telemetry. EKG was performed that showed anterolateral wall abnormality.  echocardiogram showed depressed ejection fraction 25-30%.   2014 pharmacologic Myoview suggested mid to distal anterior wall perfusion defect concerning for ischemia. He was started on beta blocker and ACE inhibitor He underwent cardiac catheterization given the perfusion defect and severely depressed ejection fraction.   this showed no significant CAD, ejection fraction had improved up to 40%. He presents today for routine follow-up of his nonischemic cardiomyopathy and hypertension  On 05/03/2019,  playing baseball,  developed chest pain, collapsed,  unresponsive. Bystander started CPR   in ventricular fibrillation and required 5 shocks while in the field with subsequent ROSC. He was intubated   Total downtime estimated at approximately 5 minutes.  Cooling protocol.  Troponin peaked at 0.69.  Chest CTA minimally displaced fracture of the anterior right fourth rib and nondisplaced fracture of the sternal body   CT head was negative for acute intracranial abnormality. COVID-19 negative.    extubated but had recurrent sustained VT on 05/06/2019 with successful defibrillation   started on Amiodarone infusion  05/07/2019,  right and left heart catheterization with mild luminal irregularities   severely reduced LVEF (20-25%) with global hypokinesis  Transferred to [facility] for ICD  In follow up today, blood pressure  70 systolic, diaphoretic in the  office Reports that he does not feel as well Just took his medications before he came over  Reports feeling bad in the mornings Takes all pills in the AM After a few hours of laying down feels better He brings in blood pressures with him this morning showing typical systolic pressure ranging from 99 up to 104 on average over 60  No heavy activity, denies being dehydrated,  Would like to go back to work Reports adequate fluid intake at home He does have blood pressure cuff at home but has not been checking his numbers  Lab work reviewed HBA1C 7.0 Total chol 156, LD 68 Nonsmoker  EKG performed on today's visit in the office personally reviewed by myself showing normal sinus rhythm with rate 66 bpm T wave abnormality V4 through V6 1 and aVL, inferior leads    PMH:   has a past medical history of CAD (coronary artery disease), Diabetes (HCC), Frequent PVCs, Heart failure with reduced ejection fraction (HCC), Hypertension, Impotence of organic origin, NICM (nonischemic cardiomyopathy) (HCC), Pain in joint, shoulder region, Personal history of colonic polyps, and Ventricular tachycardia (HCC).  PSH:    Past Surgical History:  Procedure Laterality Date  . CARDIAC CATHETERIZATION  11/17/2013  . COLONOSCOPY  2009  . ICD IMPLANT N/A 05/08/2019   Procedure: ICD IMPLANT;  Surgeon: Russell Range, MD;  Location: [facility] INVASIVE CV LAB;  Service: Cardiovascular;  Laterality: N/A;  . RIGHT/LEFT HEART CATH AND CORONARY ANGIOGRAPHY N/A 05/07/2019   Procedure: RIGHT/LEFT HEART CATH AND CORONARY ANGIOGRAPHY;  Surgeon: Russell Kendall, MD;  Location: [facility] INVASIVE CV LAB;  Service: Cardiovascular;  Laterality: N/A;    Current Outpatient Medications  Medication Sig Dispense Refill  . [START ON 06/12/2019] amiodarone (PACERONE) 200 MG tablet Take 1 tablet (200 mg total) by mouth daily. 30 tablet 2  . amLODipine (NORVASC) 10 MG tablet Take 0.5 tablets (5 mg total) by mouth daily. 90 tablet 3  . aspirin EC  81 MG tablet Take 1 tablet (81 mg total) by mouth daily.    . brimonidine (ALPHAGAN) 0.2 % ophthalmic solution Place 1 drop into both eyes 2 (two) times daily.    . carvedilol (COREG) 25 MG tablet Take 1 tablet (25 mg total) by mouth 2 (two) times daily with a meal. 60 tablet 2  . cetirizine (ZYRTEC) 10 MG tablet Take 10 mg by mouth daily.  4  . Cholecalciferol (VITAMIN D3) 2000 units TABS Take by mouth.    Marland Kitchen glucose blood test strip Use as instructed to check blood sugar up to x 2 daily 100 each 12  . Lancets (ONETOUCH ULTRASOFT) lancets Use as instructed to check blood sugar up to x 2 daily 100 each 12  . latanoprost (XALATAN) 0.005 % ophthalmic solution Place 1 drop into both eyes at bedtime.     . metFORMIN (GLUCOPHAGE) 500 MG tablet Take 1 tablet (500 mg total) by mouth daily with breakfast.    . nitroGLYCERIN (NITROSTAT) 0.4 MG SL tablet Place 1 tablet (0.4 mg total) under the tongue every 5 (five) minutes x 3 doses as needed for chest pain. 30 tablet 2  . polyvinyl alcohol (LIQUIFILM TEARS) 1.4 % ophthalmic solution Place 2 drops into both eyes every 8 (eight) hours. 15 mL 0  . sacubitril-valsartan (ENTRESTO) 24-26 MG Take 1 tablet by mouth 2 (two) times daily. 60 tablet 2  . spironolactone (ALDACTONE) 25 MG tablet Take 1 tablet (25 mg total) by mouth daily. 30 tablet 2  . timolol (TIMOPTIC) 0.5 % ophthalmic solution Place 1 drop into both eyes 2 (two) times daily.      No current facility-administered medications for this visit.      Allergies:   Patient has no known allergies.   Social History:  The patient  reports that he has never smoked. He has never used smokeless tobacco. He reports current alcohol use. He reports that he does not use drugs.   Family History:   family history includes Colon cancer in his father; Diabetes in his mother; Hypertension in his mother.    Review of Systems: Review of Systems  Constitutional: Positive for diaphoresis.  HENT: Negative.    Respiratory: Negative.   Cardiovascular: Negative.   Gastrointestinal: Negative.   Musculoskeletal: Negative.   Neurological: Positive for dizziness.  Psychiatric/Behavioral: Negative.   All other systems reviewed and are negative.    PHYSICAL EXAM: VS:  BP 110/68 (BP Location: Left Arm, Patient Position: Sitting, Cuff Size: Normal)   Pulse 66   Ht 5\' 8"  (1.727 m)   Wt 148 lb 4 oz (67.2 kg)   BMI 22.54 kg/m  , BMI Body mass index is 22.54 kg/m. Constitutional:  oriented to person, place, and time. Feels hot, diaphoretic HENT:  Head: Normocephalic and atraumatic.  Eyes:  no discharge. No scleral icterus.  Neck: Normal [name] of motion. Neck supple. No JVD present.  Cardiovascular: Normal rate, regular rhythm, normal heart sounds and intact distal pulses. Exam reveals no gallop and no friction rub. No edema No murmur heard. Pulmonary/Chest: Effort normal and breath sounds normal. No stridor. No respiratory distress.  no wheezes.  no rales.  no tenderness.  Abdominal: Soft.  no distension.  no tenderness.  Musculoskeletal: Normal [name] of motion.  no  tenderness or deformity.  Neurological:  normal muscle tone. Coordination normal. No atrophy Skin: Skin is warm and dry. No rash noted. not diaphoretic.  Psychiatric:  normal mood and affect. behavior is normal. Thought content normal.    Recent Labs: 05/04/2019: B Natriuretic Peptide 78.0 05/09/2019: ALT 43; Magnesium 2.0; TSH 2.334 05/10/2019: BUN 17; Creatinine, Ser 1.04; Hemoglobin 14.4; Platelets 167; Potassium 4.1; Sodium 139    Lipid Panel Lab Results  Component Value Date   CHOL 156 06/18/2018   HDL 70 06/18/2018   LDLCALC 68 06/18/2018   TRIG 75 05/05/2019      Wt Readings from Last 3 Encounters:  06/02/19 148 lb 4 oz (67.2 kg)  05/22/19 147 lb 9.6 oz (67 kg)  05/10/19 146 lb 6.4 oz (66.4 kg)      ASSESSMENT AND PLAN:  Hypotension Markedly depressed blood pressure on today's visit Suspect secondary to  dehydration, overmedication Numerous checks of blood pressure systolic of 70 even at 45 degrees legs elevated, manual cuff performed by myself Automated cuff would not make a reading on the right given blood pressure was so low Pulse weak, thready Reports having symptoms at home on previous days since he has left the hospital -He was placed in supine/45 degree angle legs elevated Cold towel on the head, IV placed, given 750 cc of fluid slowly Slow improvement of blood pressure up to 110 systolic Process took several hours He was able to ambulate around the office, and was discharged home with instruction to lay low for the remainder of the day Skip Entresto this evening Check blood pressure before taking morning Entresto tomorrow We will hold the amlodipine and spironolactone for now If blood pressure continues to run low we may need to change Entresto to low-dose losartan 25 daily Encouraged him to increase his fluids, to a mild degree  Congestive dilated cardiomyopathy (HCC) - Plan: EKG 12-Lead As above, recent cardiac arrest, presenting today with hypotension Medication changes as above  Controlled type 2 diabetes mellitus without complication, unspecified long term insulin use status (HCC) -- Recently well controlled Weight stable if not down Medication changes above   Total encounter time more than 60 minutes Greater than 50% was spent in counseling and coordination of care with the patient   Disposition:   F/U  1 month   No orders of the defined types were placed in this encounter.    Signed, Russell [name], M.D., Ph.D. 06/02/2019  [facility] [place] [phone]

## 2019-06-02 ENCOUNTER — Telehealth: Payer: Self-pay | Admitting: Cardiovascular Disease

## 2019-06-02 ENCOUNTER — Encounter: Payer: Self-pay | Admitting: Cardiovascular Disease

## 2019-06-02 ENCOUNTER — Other Ambulatory Visit: Payer: Self-pay

## 2019-06-02 ENCOUNTER — Telehealth: Payer: Self-pay

## 2019-06-02 ENCOUNTER — Ambulatory Visit: Payer: BLUE CROSS/BLUE SHIELD | Admitting: Cardiovascular Disease

## 2019-06-02 VITALS — BP 70/40 | HR 66 | Ht 68.0 in | Wt 148.2 lb

## 2019-06-02 DIAGNOSIS — I4901 Ventricular fibrillation: Secondary | ICD-10-CM | POA: Diagnosis not present

## 2019-06-02 DIAGNOSIS — I420 Dilated cardiomyopathy: Secondary | ICD-10-CM

## 2019-06-02 DIAGNOSIS — I469 Cardiac arrest, cause unspecified: Secondary | ICD-10-CM | POA: Diagnosis not present

## 2019-06-02 DIAGNOSIS — I5022 Chronic systolic (congestive) heart failure: Secondary | ICD-10-CM

## 2019-06-02 DIAGNOSIS — E1136 Type 2 diabetes mellitus with diabetic cataract: Secondary | ICD-10-CM

## 2019-06-02 NOTE — Telephone Encounter (Signed)
There are a lot of protocol questions and information that needs to be documented in an office visit to get a Sleep Study ordered / covered.  I would request that he schedule either an in person, or a Virtual Visit by Telephone when he is ready to discuss sleep apnea and for [place] to place orders.  Nobie Putnam, [facility] 06/02/2019, 10:45 AM

## 2019-06-02 NOTE — Telephone Encounter (Signed)
The pt wife will call us back and schedule an appt at a later time.

## 2019-06-02 NOTE — Telephone Encounter (Signed)
Pending - patient given consent to review at checkout

## 2019-06-02 NOTE — Telephone Encounter (Signed)
The pt wife called requesting a order for a sleep study. She complains that the patient is waking up abruptly from his sleep and gasping for air.He is also doing a lot of jerking and moving in his sleep. She spoke with the patient cardiologist and was told to f/u with his PCP to discuss possible evaluation for sleep apnea.

## 2019-06-02 NOTE — Patient Instructions (Addendum)
Please get any paperwork we need to fill out for you for your return to work. If blood pressures ok this week, it may be that you can go back next week.   Phone: [phone] Fax: [phone]  Medication Instructions:  Your physician has recommended you make the following change in your medication:   1) Stop amlodipine  2) Stop spironolactone  3) Skip entresto tonight (Monday) - check your blood pressure tomorrow morning prior to taking entresto, if the top number is 100 or below, call the office  Monitor blood pressure at home,  Including orthostatics: 1) Lay down flat (or as flat as possible) for 10 minutes, 2) then check you blood pressure & heart rate lying 3) sit up and immediately check your blood pressure & heart rate again 4) stand up and immediately check your blood pressure & heart rate again 5) continue to stand for 3 minutes if possible and check your blood pressure & heart rate one last time   If you need a refill on your cardiac medications before your next appointment, please call your pharmacy.    Lab work: No new labs needed   If you have labs (blood work) drawn today and your tests are completely normal, you will receive your results only by: Marland Kitchen MyChart Message (if you have MyChart) OR . A paper copy in the mail If you have any lab test that is abnormal or we need to change your treatment, we will call you to review the results.   Testing/Procedures: No new testing needed   Follow-Up: At [facility], you and your health needs are our priority.  As part of our continuing mission to provide you with exceptional heart care, we have created designated Provider Care Teams.  These Care Teams include your primary Cardiologist (physician) and Advanced Practice Providers (APPs -  Physician Assistants and Nurse Practitioners) who all work together to provide you with the care you need, when you need it.  . You will need a follow up appointment in 1 month  (e-visit)- e-mail link .  Marland Kitchen Providers on your designated Care Team:   . Murray Hodgkins, NP . Christell Faith, PA-C . Marrianne Mood, PA-C  Any Other Special Instructions Will Be Listed Below (If Applicable).  For educational health videos Log in to : [email or web address] Or : [email or web address], password : [email or web address]

## 2019-06-03 NOTE — Telephone Encounter (Signed)
Forwarded forms to [employer]

## 2019-06-03 NOTE — Telephone Encounter (Signed)
[employer] forms completed and given to [place].

## 2019-06-10 ENCOUNTER — Telehealth: Payer: Self-pay | Admitting: Cardiovascular Disease

## 2019-06-10 MED ORDER — AMIODARONE HCL 200 MG PO TABS: 200.0000 mg | ORAL_TABLET | Freq: Every day | ORAL | 0 refills | Status: DC

## 2019-06-10 NOTE — Telephone Encounter (Signed)
Please advise if ok to refill Amiodarone 200 mg qd looks like pt told to start 06/12/2019. Pr requesting 90 day refill.

## 2019-06-10 NOTE — Telephone Encounter (Signed)
According to office visit from 06/02/19 with Dr Rockey Situ: "If blood pressures ok this week, it may be that you can go back next week."  Called patient and got recent readings as follows. Patient denies any chest pain, SOB, dizziness or headaches. States he takes these BP readings first thing in the morning prior to taking morning medications.  6/9 Flat 125/79,  69  Sit 126/83,  69  Stand 123/86,  79  3 min 11/85,  78  6/10 Flat 109/80,  62  Sit 112/82,  68  3 min 114/85,  69   6/11 Flat 113/73,  61  Sit 107/88,  72  Stand 107/85,  73  3 min 124/82,  69  6/12 Flat 105/75,  59  Sit 110/79,  64  Stand 111/83,  70  3 min 113/86,  68  6/13 120/80,  64 6/14 190/78,  63  6/15 Flat 118/79,  61  Sit 123/80,  70  Stand 119/83,  67  3 min 131/89,  67  6/16 Sit 183/69,  65   Stand 131/85,  6  3 min 131/89,  67  Routing to Dr Rockey Situ for review.

## 2019-06-10 NOTE — Telephone Encounter (Signed)
Refill sent. Listed on patient's list at last visit.

## 2019-06-10 NOTE — Telephone Encounter (Signed)
Patient needs return to work note faxed to   [phone] fax to [employer]    Letter should say Russell [name] is clear to come back to work 06/16/19 without any restrictions.

## 2019-06-10 NOTE — Telephone Encounter (Signed)
°*  STAT* If patient is at the pharmacy, call can be transferred to refill team.   1. Which medications need to be refilled? (please list name of each medication and dose if known) amiodarone 200 mg po q d   2. Which pharmacy/location (including street and city if local pharmacy) is medication to be sent to?  [facility] [address] [place]   3. Do they need a 30 day or 90 day supply? [place]

## 2019-06-11 MED ORDER — AMIODARONE HCL 200 MG PO TABS: 200.0000 mg | ORAL_TABLET | Freq: Every day | ORAL | 3 refills | Status: DC

## 2019-06-11 NOTE — Telephone Encounter (Signed)
Blood pressures look okay Should be good enough to return back to work with no restrictions Would confirm his medication list which should include Coreg and Entresto We did hold amlodipine and spironolactone for hypotension

## 2019-06-11 NOTE — Telephone Encounter (Signed)
Prescription sent in with refills

## 2019-06-11 NOTE — Telephone Encounter (Signed)
Would refill amiodarone

## 2019-06-12 ENCOUNTER — Encounter: Payer: Self-pay | Admitting: *Deleted

## 2019-06-12 NOTE — Telephone Encounter (Signed)
Letter printed and faxed to Juliann Pulse at [phone]. Confirmation received.  Original mailed to the patient.

## 2019-06-12 NOTE — Telephone Encounter (Signed)
Spoke with patient and confirmed his medications in detail. He is taking amiodarone 200 mg once a day, Carvedilol 25 mg twice a day, and Entresto 24-26 mg twice a day. Advised that I would get letter done, have someone fax to [facility] per his request and then mail letter to him. He was appreciative for the assistance with this and had no further questions at this time.

## 2019-06-12 NOTE — Telephone Encounter (Signed)
Letter completed and will route to nurse in office to print letter, fax to Juliann Pulse at [phone], and mail to patient.

## 2019-06-24 ENCOUNTER — Other Ambulatory Visit: Payer: Self-pay | Admitting: Family Medicine

## 2019-06-24 DIAGNOSIS — I10 Essential (primary) hypertension: Secondary | ICD-10-CM

## 2019-06-24 DIAGNOSIS — E1136 Type 2 diabetes mellitus with diabetic cataract: Secondary | ICD-10-CM

## 2019-06-25 ENCOUNTER — Encounter: Payer: Self-pay | Admitting: Cardiovascular Disease

## 2019-06-26 ENCOUNTER — Encounter: Payer: Self-pay | Admitting: Family Medicine

## 2019-06-26 LAB — LIPID PANEL
Cholesterol: 170 (ref 0–200)
HDL: 70 (ref 35–70)
LDL Cholesterol: 83
Triglycerides: 84 (ref 40–160)

## 2019-06-26 LAB — HEPATIC FUNCTION PANEL
ALT: 16 (ref 10–40)
AST: 14 (ref 14–40)
Alkaline Phosphatase: 62 (ref 25–125)
Bilirubin, Total: 0.3

## 2019-06-26 LAB — CBC AND DIFFERENTIAL
HCT: 40 — AB (ref 41–53)
Hemoglobin: 12.5 — AB (ref 13.5–17.5)
Neutrophils Absolute: 5
Platelets: 259 (ref 150–399)
WBC: 7

## 2019-06-26 LAB — BASIC METABOLIC PANEL
BUN: 12 (ref 4–21)
Creatinine: 1.2 (ref 0.6–1.3)
Glucose: 157
Potassium: 4 (ref 3.4–5.3)
Sodium: 141 (ref 137–147)

## 2019-06-26 LAB — VITAMIN B12: Vitamin B-12: 307

## 2019-06-26 LAB — HEMOGLOBIN A1C: Hemoglobin A1C: 7.3

## 2019-06-26 LAB — PSA: PSA: 4

## 2019-06-26 LAB — VITAMIN D 25 HYDROXY (VIT D DEFICIENCY, FRACTURES): Vit D, 25-Hydroxy: 34.8

## 2019-06-26 LAB — TSH: TSH: 1.32 (ref 0.41–5.90)

## 2019-06-30 ENCOUNTER — Encounter: Payer: BLUE CROSS/BLUE SHIELD | Admitting: Family Medicine

## 2019-07-01 ENCOUNTER — Telehealth: Payer: Self-pay | Admitting: Cardiovascular Disease

## 2019-07-01 NOTE — Progress Notes (Signed)
Cardiology Office Note  Date:  07/02/2019   ID:  Russell Love., DOB 17-Jan-1951, MRN [id number]  PCP:  Olin Hauser, DO   Chief Complaint  Patient presents with  . other    1 month follow up. Meds reviewed verbally with patient.     HPI:  Russell [name] is a 68 year old gentleman with a history of  diabetes,  Hypertension, who initially presented for abnormal EKG. he had a colonoscopy. During the procedure, noted to have abnormal telemetry. EKG was performed that showed anterolateral wall abnormality.  echocardiogram showed depressed ejection fraction 25-30%.   2014 pharmacologic Myoview suggested mid to distal anterior wall perfusion defect concerning for ischemia. He was started on beta blocker and ACE inhibitor He underwent cardiac catheterization given the perfusion defect and severely depressed ejection fraction.   this showed no significant CAD, ejection fraction had improved up to 40%. He presents today for routine follow-up of his nonischemic cardiomyopathy and hypertension, has ICD   On 05/03/2019,  playing baseball,  developed chest pain, collapsed,  unresponsive. Bystander started CPR   in ventricular fibrillation and required 5 shocks while in the field with subsequent ROSC. He was intubated  Total downtime estimated at approximately 5 minutes. Cooling protocol.  Troponin peaked at 0.69.  . sustained VT on 05/06/2019 with successful defibrillation in the hospital  started on Amiodarone infusion  05/07/2019,  right and left heart catheterization with mild luminal irregularities   severely reduced LVEF (20-25%) with global hypokinesis  ICD was placed  On prior office visit following discharge presented to the office with systolic pressure of 70, diaphoretic Likely overmedicated, prerenal, symptoms improved with slow hydration in the office Amlodipine held, spironolactone held at that time  In follow-up today reports his blood pressure started running high and he  restarted amlodipine Systolic pressures 818E if not occasionally 140 he has seen on today's visit  Reports he is gone back to work  EKG personally reviewed by myself on todays visit Shows normal sinus rhythm with rate 62 bpm T wave abnormality V5 V6   PMH:   has a past medical history of CAD (coronary artery disease), Diabetes ([place]), Frequent PVCs, Heart failure with reduced ejection fraction ([place]), Hypertension, Impotence of organic origin, NICM (nonischemic cardiomyopathy) ([place]), Pain in joint, shoulder region, Personal history of colonic polyps, and Ventricular tachycardia ([place]).  PSH:    Past Surgical History:  Procedure Laterality Date  . CARDIAC CATHETERIZATION  11/17/2013  . COLONOSCOPY  2009  . ICD IMPLANT N/A 05/08/2019   Procedure: ICD IMPLANT;  Surgeon: Russell Grayer, MD;  Location: [place] CV [facility];  Service: Cardiovascular;  Laterality: N/A;  . RIGHT/LEFT HEART CATH AND CORONARY ANGIOGRAPHY N/A 05/07/2019   Procedure: RIGHT/LEFT HEART CATH AND CORONARY ANGIOGRAPHY;  Surgeon: Russell Bush, MD;  Location: [place] CV LAB;  Service: Cardiovascular;  Laterality: N/A;    Current Outpatient Medications  Medication Sig Dispense Refill  . amiodarone (PACERONE) 200 MG tablet Take 1 tablet (200 mg total) by mouth daily. 90 tablet 3  . amLODipine (NORVASC) 10 MG tablet Take 10 mg by mouth daily.    Marland Kitchen aspirin EC 81 MG tablet Take 1 tablet (81 mg total) by mouth daily.    . brimonidine (ALPHAGAN) 0.2 % ophthalmic solution Place 1 drop into both eyes 2 (two) times daily.    . carvedilol (COREG) 25 MG tablet TAKE 1 TABLET BY MOUTH TWICE A DAY 180 tablet 3  . cetirizine (ZYRTEC) 10 MG tablet Take 10  mg by mouth daily.  4  . Cholecalciferol (VITAMIN D3) 2000 units TABS Take by mouth.    Marland Kitchen glucose blood test strip Use as instructed to check blood sugar up to x 2 daily 100 each 12  . Lancets (ONETOUCH ULTRASOFT) lancets Use as instructed to check blood sugar up to x 2 daily 100  each 12  . latanoprost (XALATAN) 0.005 % ophthalmic solution Place 1 drop into both eyes at bedtime.     . metFORMIN (GLUCOPHAGE) 500 MG tablet Take 1 tablet (500 mg total) by mouth daily with breakfast.    . metFORMIN (GLUCOPHAGE-XR) 500 MG 24 hr tablet TAKE 1 TABLET BY MOUTH EVERY DAY WITH BREAKFAST 90 tablet 3  . nitroGLYCERIN (NITROSTAT) 0.4 MG SL tablet Place 1 tablet (0.4 mg total) under the tongue every 5 (five) minutes x 3 doses as needed for chest pain. 30 tablet 2  . polyvinyl alcohol (LIQUIFILM TEARS) 1.4 % ophthalmic solution Place 2 drops into both eyes every 8 (eight) hours. 15 mL 0  . sacubitril-valsartan (ENTRESTO) 24-26 MG Take 1 tablet by mouth 2 (two) times daily. 60 tablet 2  . timolol (TIMOPTIC) 0.5 % ophthalmic solution Place 1 drop into both eyes 2 (two) times daily.      No current facility-administered medications for this visit.      Allergies:   Patient has no known allergies.   Social History:  The patient  reports that he has never smoked. He has never used smokeless tobacco. He reports current alcohol use. He reports that he does not use drugs.   Family History:   family history includes Colon cancer in his father; Diabetes in his mother; Hypertension in his mother.    Review of Systems: Review of Systems  Constitutional: Positive for diaphoresis.  HENT: Negative.   Respiratory: Negative.   Cardiovascular: Negative.   Gastrointestinal: Negative.   Musculoskeletal: Negative.   Neurological: Positive for dizziness.  Psychiatric/Behavioral: Negative.   All other systems reviewed and are negative.   PHYSICAL EXAM: VS:  BP (!) 146/82 (BP Location: Left Arm, Patient Position: Sitting, Cuff Size: Normal)   Pulse 62   Ht 5\' 8"  (1.727 m)   Wt 155 lb 12 oz (70.6 kg)   BMI 23.68 kg/m  , BMI Body mass index is 23.68 kg/m. Constitutional:  oriented to person, place, and time. No distress.  HENT:  Head: Grossly normal Eyes:  no discharge. No scleral  icterus.  Neck: No JVD, no carotid bruits  Cardiovascular: Regular rate and rhythm, no murmurs appreciated Pulmonary/Chest: Clear to auscultation bilaterally, no wheezes or rails Abdominal: Soft.  no distension.  no tenderness.  Musculoskeletal: Normal range of motion Neurological:  normal muscle tone. Coordination normal. No atrophy Skin: Skin warm and dry Psychiatric: normal affect, pleasant  Recent Labs: 05/04/2019: B Natriuretic Peptide 78.0 05/09/2019: Magnesium 2.0 06/26/2019: ALT 16; BUN 12; Creatinine 1.2; Hemoglobin 12.5; Platelets 259; Potassium 4.0; Sodium 141; TSH 1.32    Lipid Panel Lab Results  Component Value Date   CHOL 170 06/26/2019   HDL 70 06/26/2019   LDLCALC 83 06/26/2019   TRIG 84 06/26/2019      Wt Readings from Last 3 Encounters:  07/02/19 155 lb 12 oz (70.6 kg)  06/02/19 148 lb 4 oz (67.2 kg)  05/22/19 147 lb 9.6 oz (67 kg)     ASSESSMENT AND PLAN:   Congestive dilated cardiomyopathy (HCC) - Plan: EKG 12-Lead recent cardiac arrest, We will hold the amlodipine, increase Entresto up  to 97/103 mg p.o. twice daily Recommended he call us with blood pressure measurements in the next week or 2 BMP in 1 month Echocardiogram 2 months Ideally would like to add spironolactone.  Was held for hypotension/dehydration. Weight since has trended up from 147 now 155 pounds Appears relatively euvolemic  Controlled type 2 diabetes mellitus without complication, unspecified long term insulin use status (HCC) -- Weight is up, stable   Total encounter time more than 25 minutes Greater than 50% was spent in counseling and coordination of care with the patient   Disposition:   F/U 3 months   Orders Placed This Encounter  Procedures  . EKG 12-Lead     Signed, Russell [name], M.D., Ph.D. 07/02/2019  [facility], [place] [phone]

## 2019-07-01 NOTE — Telephone Encounter (Signed)

## 2019-07-02 ENCOUNTER — Ambulatory Visit (INDEPENDENT_AMBULATORY_CARE_PROVIDER_SITE_OTHER): Payer: BC Managed Care – PPO | Admitting: Cardiovascular Disease

## 2019-07-02 ENCOUNTER — Other Ambulatory Visit: Payer: Self-pay

## 2019-07-02 ENCOUNTER — Encounter: Payer: Self-pay | Admitting: Cardiovascular Disease

## 2019-07-02 VITALS — BP 146/82 | HR 62 | Ht 68.0 in | Wt 155.8 lb

## 2019-07-02 DIAGNOSIS — I5022 Chronic systolic (congestive) heart failure: Secondary | ICD-10-CM | POA: Diagnosis not present

## 2019-07-02 DIAGNOSIS — E1136 Type 2 diabetes mellitus with diabetic cataract: Secondary | ICD-10-CM

## 2019-07-02 DIAGNOSIS — I469 Cardiac arrest, cause unspecified: Secondary | ICD-10-CM

## 2019-07-02 DIAGNOSIS — I7090 Unspecified atherosclerosis: Secondary | ICD-10-CM

## 2019-07-02 DIAGNOSIS — I10 Essential (primary) hypertension: Secondary | ICD-10-CM

## 2019-07-02 DIAGNOSIS — I420 Dilated cardiomyopathy: Secondary | ICD-10-CM

## 2019-07-02 DIAGNOSIS — I4901 Ventricular fibrillation: Secondary | ICD-10-CM

## 2019-07-02 MED ORDER — SACUBITRIL-VALSARTAN 97-103 MG PO TABS: 1.0000 | ORAL_TABLET | Freq: Two times a day (BID) | ORAL | 11 refills | Status: DC

## 2019-07-02 NOTE — Patient Instructions (Addendum)
Medication Instructions:  Your physician has recommended you make the following change in your medication:  1. STOP Amlodipine 2. INCREASE Entresto up to 97/103 mg twice a day  If you need a refill on your cardiac medications before your next appointment, please call your pharmacy.    Lab work: No new labs needed   If you have labs (blood work) drawn today and your tests are completely normal, you will receive your results only by: Marland Kitchen MyChart Message (if you have MyChart) OR . A paper copy in the mail If you have any lab test that is abnormal or we need to change your treatment, we will call you to review the results.   Testing/Procedures: Your physician has requested that you have an echocardiogram. Echocardiography is a painless test that uses sound waves to create images of your heart. It provides your doctor with information about the size and shape of your heart and how well your heart's chambers and valves are working. This procedure takes approximately one hour. There are no restrictions for this procedure.     Follow-Up: At [facility], you and your health needs are our priority.  As part of our continuing mission to provide you with exceptional heart care, we have created designated Provider Care Teams.  These Care Teams include your primary Cardiologist (physician) and Advanced Practice Providers (APPs -  Physician Assistants and Nurse Practitioners) who all work together to provide you with the care you need, when you need it.  . You will need a follow up appointment in 3 months   . Providers on your designated Care Team:   . Murray Hodgkins, NP . Christell Faith, PA-C . Marrianne Mood, PA-C  Any Other Special Instructions Will Be Listed Below (If Applicable).  For educational health videos Log in to : [email or web address] Or : [email or web address], password : [email or web address]

## 2019-08-06 ENCOUNTER — Telehealth: Payer: Self-pay

## 2019-08-08 NOTE — Telephone Encounter (Signed)
Spoke wit pt about his upcoming appt on 08/11/19. Pt stated he would check his vitals prior to his appt and did not have any concerns at this time.

## 2019-08-11 ENCOUNTER — Encounter: Payer: Self-pay | Admitting: Internal Medicine

## 2019-08-11 ENCOUNTER — Telehealth: Payer: Self-pay

## 2019-08-11 ENCOUNTER — Ambulatory Visit (INDEPENDENT_AMBULATORY_CARE_PROVIDER_SITE_OTHER): Payer: BLUE CROSS/BLUE SHIELD | Admitting: *Deleted

## 2019-08-11 ENCOUNTER — Telehealth (INDEPENDENT_AMBULATORY_CARE_PROVIDER_SITE_OTHER): Payer: BC Managed Care – PPO | Admitting: Internal Medicine

## 2019-08-11 VITALS — BP 143/81 | HR 61 | Ht 68.0 in | Wt 155.0 lb

## 2019-08-11 DIAGNOSIS — I469 Cardiac arrest, cause unspecified: Secondary | ICD-10-CM | POA: Diagnosis not present

## 2019-08-11 DIAGNOSIS — I5022 Chronic systolic (congestive) heart failure: Secondary | ICD-10-CM | POA: Diagnosis not present

## 2019-08-11 DIAGNOSIS — I10 Essential (primary) hypertension: Secondary | ICD-10-CM | POA: Diagnosis not present

## 2019-08-11 DIAGNOSIS — I420 Dilated cardiomyopathy: Secondary | ICD-10-CM

## 2019-08-11 LAB — CUP PACEART REMOTE DEVICE CHECK
Battery Remaining Longevity: 135 mo
Battery Remaining Longevity: 135 mo
Battery Voltage: 3.09 V
Battery Voltage: 3.09 V
Brady Statistic RV Percent Paced: 0.03 %
Brady Statistic RV Percent Paced: 0.02 %
Date Time Interrogation Session: 20200817062823
Date Time Interrogation Session: 20200817083046
HighPow Impedance: 53 Ohm
HighPow Impedance: 52 Ohm
Implantable Lead Implant Date: 20200514
Implantable Lead Implant Date: 20200514
Implantable Lead Location: 753860
Implantable Lead Location: 753860
Implantable Lead Model: 6935
Implantable Lead Model: 6935
Implantable Pulse Generator Implant Date: 20200514
Implantable Pulse Generator Implant Date: 20200514
Lead Channel Impedance Value: 342 Ohm
Lead Channel Impedance Value: 399 Ohm
Lead Channel Impedance Value: 342 Ohm
Lead Channel Impedance Value: 399 Ohm
Lead Channel Pacing Threshold Amplitude: 0.75 V
Lead Channel Pacing Threshold Amplitude: 0.75 V
Lead Channel Pacing Threshold Pulse Width: 0.4 ms
Lead Channel Pacing Threshold Pulse Width: 0.4 ms
Lead Channel Sensing Intrinsic Amplitude: 14.375 mV
Lead Channel Sensing Intrinsic Amplitude: 14.375 mV
Lead Channel Sensing Intrinsic Amplitude: 14.375 mV
Lead Channel Sensing Intrinsic Amplitude: 14.375 mV
Lead Channel Setting Pacing Amplitude: 3.5 V
Lead Channel Setting Pacing Amplitude: 3.5 V
Lead Channel Setting Pacing Pulse Width: 0.4 ms
Lead Channel Setting Pacing Pulse Width: 0.4 ms
Lead Channel Setting Sensing Sensitivity: 0.3 mV
Lead Channel Setting Sensing Sensitivity: 0.3 mV

## 2019-08-11 NOTE — Telephone Encounter (Signed)
Patient referred to [facility] clinic by Dr Rayann Heman following 08/11/2019 virtual visit.  Attempted ICM intro call and voice mail box has not been set up yet. Will attempt another call at later time.

## 2019-08-11 NOTE — Progress Notes (Signed)
Electrophysiology TeleHealth Note   Due to national recommendations of social distancing due to COVID 19, an audio/video telehealth visit is felt to be most appropriate for this patient at this time.  See MyChart message from today for the patient's consent to telehealth for [facility].  Date:  08/11/2019   ID:  Elder Love., DOB 09-25-1951, MRN [id number]  Location: patient's home  Provider location:  [facility]  Evaluation Performed: Follow-up visit  PCP:  Olin Hauser, DO   Electrophysiologist:  Dr Rayann Heman Cardiology:  Rockey Situ Chief Complaint:  Cardiac arrest  History of Present Illness:    Moussa Wiegand. is a 68 y.o. male who presents via telehealth conferencing today.  Since last being seen in our clinic, the patient reports doing very well.  He has made great recovery from his cardiac arrest in May.  He feels back to baseline.  Today, he denies symptoms of palpitations, chest pain, shortness of breath,  lower extremity edema, dizziness, presyncope, or syncope.  The patient is otherwise without complaint today.  The patient denies symptoms of fevers, chills, cough, or new SOB worrisome for COVID 19.  Past Medical History:  Diagnosis Date  . CAD (coronary artery disease)    05/07/2019 LHC showed nonobstructive CAD  . Diabetes ([place])   . Frequent PVCs   . Heart failure with reduced ejection fraction ([place])    04/2019 EF 20-25%  . Hypertension   . Impotence of organic origin   . NICM (nonischemic cardiomyopathy) ([place])    HFrEF in setting of NICM  . Pain in joint, shoulder region   . Personal history of colonic polyps   . Ventricular tachycardia ([place])    04/2019 in setting of cardiac arrest    Past Surgical History:  Procedure Laterality Date  . CARDIAC CATHETERIZATION  11/17/2013  . COLONOSCOPY  2009  . ICD IMPLANT N/A 05/08/2019   Procedure: ICD IMPLANT;  Surgeon: Thompson Grayer, MD;  Location: [place] CV LAB;  Service: Cardiovascular;  Laterality:  N/A;  . RIGHT/LEFT HEART CATH AND CORONARY ANGIOGRAPHY N/A 05/07/2019   Procedure: RIGHT/LEFT HEART CATH AND CORONARY ANGIOGRAPHY;  Surgeon: Nelva Bush, MD;  Location: [place] [facility];  Service: Cardiovascular;  Laterality: N/A;    Current Outpatient Medications  Medication Sig Dispense Refill  . amiodarone (PACERONE) 200 MG tablet Take 1 tablet (200 mg total) by mouth daily. 90 tablet 3  . aspirin EC 81 MG tablet Take 1 tablet (81 mg total) by mouth daily.    . brimonidine (ALPHAGAN) 0.2 % ophthalmic solution Place 1 drop into both eyes 2 (two) times daily.    . carvedilol (COREG) 25 MG tablet TAKE 1 TABLET BY MOUTH TWICE A DAY 180 tablet 3  . cetirizine (ZYRTEC) 10 MG tablet Take 10 mg by mouth daily.  4  . Cholecalciferol (VITAMIN D3) 2000 units TABS Take 1 capsule by mouth daily.     Marland Kitchen glucose blood test strip Use as instructed to check blood sugar up to x 2 daily 100 each 12  . Lancets (ONETOUCH ULTRASOFT) lancets Use as instructed to check blood sugar up to x 2 daily 100 each 12  . latanoprost (XALATAN) 0.005 % ophthalmic solution Place 1 drop into both eyes at bedtime.     . metFORMIN (GLUCOPHAGE-XR) 500 MG 24 hr tablet TAKE 1 TABLET BY MOUTH EVERY DAY WITH BREAKFAST 90 tablet 3  . nitroGLYCERIN (NITROSTAT) 0.4 MG SL tablet Place 1 tablet (0.4 mg total)  under the tongue every 5 (five) minutes x 3 doses as needed for chest pain. 30 tablet 2  . polyvinyl alcohol (LIQUIFILM TEARS) 1.4 % ophthalmic solution Place 2 drops into both eyes every 8 (eight) hours. 15 mL 0  . sacubitril-valsartan (ENTRESTO) 97-103 MG Take 1 tablet by mouth 2 (two) times daily. 60 tablet 11  . timolol (TIMOPTIC) 0.5 % ophthalmic solution Place 1 drop into both eyes 2 (two) times daily.      No current facility-administered medications for this visit.     Allergies:   Patient has no known allergies.   Social History:  The patient  reports that he has never smoked. He has never used smokeless tobacco.  He reports current alcohol use. He reports that he does not use drugs.   Family History:  The patient's family history includes Colon cancer in his father; Diabetes in his mother; Hypertension in his mother.   ROS:  Please see the history of present illness.   All other systems are personally reviewed and negative.    Exam:    Vital Signs:  BP (!) 143/81   Pulse 61   Ht 5\' 8"  (1.727 m)   Wt 155 lb (70.3 kg)   BMI 23.57 kg/m   Well sounding and appearing, alert and conversant, regular work of breathing,  good skin color Eyes- anicteric, neuro- grossly intact, skin- no apparent rash or lesions or cyanosis, mouth- oral mucosa is pink  ICD pocket is visualized and appears normal  Labs/Other Tests and Data Reviewed:    Recent Labs: 05/04/2019: B Natriuretic Peptide 78.0 05/09/2019: Magnesium 2.0 06/26/2019: ALT 16; BUN 12; Creatinine 1.2; Hemoglobin 12.5; Platelets 259; Potassium 4.0; Sodium 141; TSH 1.32   Wt Readings from Last 3 Encounters:  08/11/19 155 lb (70.3 kg)  07/02/19 155 lb 12 oz (70.6 kg)  06/02/19 148 lb 4 oz (67.2 kg)     Last device remote is reviewed from [employer] PDF which reveals normal device function, no arrhythmias    ASSESSMENT & PLAN:    1.  VT arrest Doing well s/p ICD implantation No recurrent arrhythmias Amiodarone 200mg  daily Will need repeat lfts, tfts and cxr on return to see EP APP In 6 months  2. Nonischemic CM No CHF symptoms Enroll in [facility] device clinic with Randon Goldsmith  3. HTN Stable No change required today   Follow-up:  carelink Return to see EP APP in 6 months Follow-up with me in a year Follow-up with Dr Mariah Milling as scheduled   Patient Risk:  after full review of this patients clinical status, I feel that they are at moderate risk at this time.  Today, I have spent 15 minutes with the patient with telehealth technology discussing arrhythmia management .    Randolm Idol, MD  08/11/2019 9:26 AM     [facility] [address] [place] [address] [phone] (office) [phone] (fax)

## 2019-08-11 NOTE — Telephone Encounter (Signed)
Calling to ask pt to send manual transmission. Transmission received.

## 2019-08-15 NOTE — Telephone Encounter (Signed)
ICM intro call and spoke with patient.  Explained ICM program and he agreed to monthly follow up.  He has not fluid symptoms at this time and feeling fine.  Advised will schedule next remote transmission from home 09/02/2019.  Provided direct ICM number and encouraged to call if experiencing fluid symptoms or concerns.  Discussed following low salt diet.

## 2019-08-18 DIAGNOSIS — H401130 Primary open-angle glaucoma, bilateral, stage unspecified: Secondary | ICD-10-CM | POA: Diagnosis not present

## 2019-08-19 ENCOUNTER — Encounter: Payer: Self-pay | Admitting: Cardiology

## 2019-08-19 NOTE — Progress Notes (Signed)
Remote ICD transmission.   

## 2019-08-22 ENCOUNTER — Ambulatory Visit (INDEPENDENT_AMBULATORY_CARE_PROVIDER_SITE_OTHER): Payer: BC Managed Care – PPO | Admitting: Family Medicine

## 2019-08-22 ENCOUNTER — Encounter: Payer: Self-pay | Admitting: Family Medicine

## 2019-08-22 ENCOUNTER — Other Ambulatory Visit: Payer: Self-pay

## 2019-08-22 VITALS — BP 160/86 | HR 61 | Temp 98.6°F | Resp 16 | Ht 68.0 in | Wt 158.0 lb

## 2019-08-22 DIAGNOSIS — E1136 Type 2 diabetes mellitus with diabetic cataract: Secondary | ICD-10-CM | POA: Diagnosis not present

## 2019-08-22 DIAGNOSIS — I10 Essential (primary) hypertension: Secondary | ICD-10-CM

## 2019-08-22 DIAGNOSIS — N401 Enlarged prostate with lower urinary tract symptoms: Secondary | ICD-10-CM

## 2019-08-22 DIAGNOSIS — R972 Elevated prostate specific antigen [PSA]: Secondary | ICD-10-CM

## 2019-08-22 DIAGNOSIS — Z23 Encounter for immunization: Secondary | ICD-10-CM | POA: Diagnosis not present

## 2019-08-22 DIAGNOSIS — R3911 Hesitancy of micturition: Secondary | ICD-10-CM

## 2019-08-22 MED ORDER — SAW PALMETTO (SERENOA REPENS) 160 MG PO CAPS: 160.0000 mg | ORAL_CAPSULE | Freq: Two times a day (BID) | ORAL | Status: DC

## 2019-08-22 NOTE — Progress Notes (Signed)
Subjective:    Patient ID: Russell Joy., male    DOB: 1951/02/06, 68 y.o.   MRN: [id number]  Russell Croney. is a 68 y.o. male presenting on 08/22/2019 for Diabetes   HPI   CHRONIC DM, Type 2with cataracts / Atherosclerotic vascular disease Remains on metformin daily. No new concerns CBGs:Checks CBG every other day, no new readings lately, average previously 120-140 Meds: Metformin 500mg  Burman Riis - previously reduced from BID Reports good compliance. Tolerating well w/o side-effects Currently on ACEi Lifestyle: Weight gain 3 lbs but overall steady -Diet (tries to follow DM diet, occasional fried food) -Exercise (Walking few times a week up to 2 miles) - He is now followed by [facility] Dr Brooke Dare see below, for glaucoma and cataracts Denies hypoglycemia, polyuria, visual changes, numbness or tingling.   CHRONIC HTN: Reports last visit Dr Mariah M[facility] 06/2019- taken off of Amlodipine and on Entresto Current Meds - Carvedilol 25mg  BID, Entrestro 97-103 BID   Reports good compliance, took meds today. Tolerating well, w/o complaints. Lifestyle: Denies CP, dyspnea, HA, edema, dizziness / lightheadedness  BPH No prior dx of BPH. He has history of normal DRE by prior Urology Dr Evelene Croon [facility]) saw him once 1-2 years ago, had a PSA test but did not do any other testing, he was told PSA was stable. He has had range PSA 3.7 to 4.8 in past 3+ years, but he was unaware of any diagnosis of BPH. - Never on FLomax or other med  AUA BPH Symptom Score over past 1 month 1. Sensation of not emptying bladder post void - 2 2. Urinate less than 2 hour after finish last void - 4 3. Start/Stop several times during void - 4 4. Difficult to postpone urination - 3 5. Weak urinary stream - 2 6. Push or strain urination - 0 7. Nocturia - 1 times  Total Score: 16 (Moderate BPH symptoms)  HM: Due for initial pneumonia vaccine at age 58 - will receive Prevnar-13 today   Depression  screen [facility] 2/9 08/22/2019 05/22/2019 12/30/2018  Decreased Interest 0 0 0  Down, Depressed, Hopeless 0 0 0  PHQ - 2 Score 0 0 0    Social History   Tobacco Use  . Smoking status: Never Smoker  . Smokeless tobacco: Never Used  Substance Use Topics  . Alcohol use: Yes    Comment: occassional  . Drug use: No    Review of Systems Per HPI unless specifically indicated above     Objective:    BP (!) 160/86 (BP Location: Left Arm, Cuff Size: Normal)   Pulse 61   Temp 98.6 F (37 C) (Oral)   Resp 16   Ht 5\' 8"  (1.727 m)   Wt 158 lb (71.7 kg)   BMI 24.02 kg/m   Wt Readings from Last 3 Encounters:  08/22/19 158 lb (71.7 kg)  08/11/19 155 lb (70.3 kg)  07/02/19 155 lb 12 oz (70.6 kg)    Physical Exam Vitals signs and nursing note reviewed.  Constitutional:      General: He is not in acute distress.    Appearance: He is well-developed. He is not diaphoretic.     Comments: Well-appearing, comfortable, cooperative  HENT:     Head: Normocephalic and atraumatic.  Eyes:     General:        Right eye: No discharge.        Left eye: No discharge.     Conjunctiva/sclera: Conjunctivae normal.  Neck:  Musculoskeletal: Normal range of motion and neck supple.     Thyroid: No thyromegaly.  Cardiovascular:     Rate and Rhythm: Normal rate and regular rhythm.     Heart sounds: Normal heart sounds. No murmur.  Pulmonary:     Effort: Pulmonary effort is normal. No respiratory distress.     Breath sounds: Normal breath sounds. No wheezing or rales.  Musculoskeletal: Normal range of motion.  Lymphadenopathy:     Cervical: No cervical adenopathy.  Skin:    General: Skin is warm and dry.     Findings: No erythema or rash.  Neurological:     Mental Status: He is alert and oriented to person, place, and time.  Psychiatric:        Behavior: Behavior normal.     Comments: Well groomed, good eye contact, normal speech and thoughts      Diabetic Foot Exam - Simple   Simple Foot Form  Diabetic Foot exam was performed with the following findings: Yes 08/22/2019  4:31 PM  Visual Inspection No deformities, no ulcerations, no other skin breakdown bilaterally: Yes Sensation Testing Intact to touch and monofilament testing bilaterally: Yes Pulse Check Posterior Tibialis and Dorsalis pulse intact bilaterally: Yes Comments    Recent Labs    12/30/18 1603 05/04/19 0215 06/26/19  HGBA1C 7.5* 7.4* 7.3     Results for orders placed or performed in visit on 08/11/19  CUP PACEART REMOTE DEVICE CHECK  Result Value Ref Range   Date Time Interrogation Session [id number]    Pulse Generator Manufacturer MERM    Pulse Gen Model [id number] Visia AF MRI VR    Pulse Gen Serial Number [id number]    Clinic Name [place]    Implantable Pulse Generator Type Implantable Cardiac Defibulator    Implantable Pulse Generator Implant Date 10272536    Implantable Lead Manufacturer MERM    Implantable Lead Model 6935 Sprint Quattro Secure S MRI SureScan    Implantable Lead Serial Number [id number]    Implantable Lead Implant Date 64403474    Implantable Lead Location Detail 1 APEX    Implantable Lead Location [id number]    Lead Channel Setting Sensing Sensitivity 0.3 mV   Lead Channel Setting Pacing Pulse Width 0.4 ms   Lead Channel Setting Pacing Amplitude 3.5 V   Lead Channel Impedance Value 399 ohm   Lead Channel Impedance Value 342 ohm   Lead Channel Sensing Intrinsic Amplitude 14.375 mV   Lead Channel Sensing Intrinsic Amplitude 14.375 mV   Lead Channel Pacing Threshold Amplitude 0.75 V   Lead Channel Pacing Threshold Pulse Width 0.4 ms   HighPow Impedance 52 ohm   Battery Status OK    Battery Remaining Longevity 135 mo   Battery Voltage 3.09 V   Brady Statistic RV Percent Paced 0.02 %  CUP PACEART REMOTE DEVICE CHECK  Result Value Ref Range   Date Time Interrogation Session [phone]    Pulse Generator Manufacturer MERM    Pulse Gen Model [id number] Visia AF MRI VR     Pulse Gen Serial Number [id number]    Clinic Name [facility]    Implantable Pulse Generator Type Implantable Cardiac Defibulator    Implantable Pulse Generator Implant Date 95188416    Implantable Lead Manufacturer [facility]    Implantable Lead Model 6935 Sprint Quattro Secure S MRI SureScan    Implantable Lead Serial Number [id number]    Implantable Lead Implant Date 60630160    Implantable Lead Location Detail 1 APEX  Implantable Lead Location [id number]    Lead Channel Setting Sensing Sensitivity 0.3 mV   Lead Channel Setting Pacing Pulse Width 0.4 ms   Lead Channel Setting Pacing Amplitude 3.5 V   Lead Channel Impedance Value 399 ohm   Lead Channel Impedance Value 342 ohm   Lead Channel Sensing Intrinsic Amplitude 14.375 mV   Lead Channel Sensing Intrinsic Amplitude 14.375 mV   Lead Channel Pacing Threshold Amplitude 0.75 V   Lead Channel Pacing Threshold Pulse Width 0.4 ms   HighPow Impedance 53 ohm   Battery Status OK    Battery Remaining Longevity 135 mo   Battery Voltage 3.09 V   Brady Statistic RV Percent Paced 0.03 %      Assessment & Plan:   Problem List Items Addressed This Visit    Benign prostatic hyperplasia with urinary hesitancy    Consistent clinically with new diagnosis BPH, with lower urinary tract symptoms (LUTS) - AUA BPH score 16 (07/2019),  Never on meds Prior Urology Dr Evelene Croonwolff did not f/u regular has had mild elevated PSA 3-4 range - No known personal/family history of prostate CA  Plan: 1. 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 CONTINUE Metformin XR 500mg  daily - discussed that no change for now if he can improve lifestyle, if not then may need to go back to BID dosing - OFFERED SGLT2 for protection with cardiovascular and also considered GLP1 he will check ins cost coverage 2. Encourage improved lifestyle - low carb, low sugar diet, reduce portion size, continue improving regular exercise - reviewed diet recommendation 3. Check CBG, bring log to next visit for review 4. Continue ACEi, ASA - future reconsider starting statin - declined today 5. Follow-up 3 months review meds      Elevated PSA    Stable baseline to improved 3-4 range PSA Prior Dr Evelene Croonwolff  Will treat as BPH Monitor PSA in future May return to Uro if need      Essential hypertension    Elevated BP Monitor home BP closer Continue current meds, f/u with Cardiology as planned, notify sooner if need, consider add back Amlodipine       Other Visit Diagnoses    Need for vaccination with 13-polyvalent pneumococcal conjugate vaccine       Relevant Orders   Pneumococcal conjugate vaccine 13-valent IM (Completed)      Meds ordered this encounter  Medications  . saw palmetto 160 MG capsule    Sig: Take 1 capsule (160 mg total) by mouth 2 (two) times daily.    Dispense:         Follow up plan: Return in about 3 months (around 11/22/2019) for 3 months for DM A1c, BPH, HTN, Cardiology.   Saralyn PilarAlexander Ekam Bonebrake, DO [facility] : [facility] 08/22/2019, 3:59 PM

## 2019-08-22 NOTE — Patient Instructions (Addendum)
Thank you for coming to the office today.  Check BP 1-2 times a day for next 1 week - call or send a message on mychart with BP readings - if still elevated >140/90 consistently we can reach out to Dr Rockey Situ or you can contact us.  -------------------------------------  Try OTC Saw Palmetto 80 to 160mg  - take one twice a day for enlarged prostate urinary symptoms Let me know if want to try rx Flomax, caution may get dizzy when stand temporarily  -------------------  1st dose Pneumonia vaccine today - next in 1 year  -------------------------  Recent Labs    12/30/18 1603 05/04/19 0215 06/26/19  HGBA1C 7.5* 7.4* 7.3   ------------------------------  Call insurance find cost and coverage of the following  Lovie Macadamia  These are medicines that can help lower sugar, you urinate out extra sugar, they can reduce risk of heart failure problems, you may get increased risk of urinary tract infection or yeast infection however.  Check cost and coverage and let me know what you would like to do, we could consider stopping metformin in the future if this medicine works well for you.  Other medicines listed here are injection - once a week, also lower sugar and help prevent heart disease  1. Ozempic (Semaglutide injection) 2. Trulicity (Dulaglutide) - once weekly 3. Bydureon BCise (Exenatide ER)  -------------------------------------------------------  Please schedule a Follow-up Appointment to: Return in about 3 months (around 11/22/2019) for 3 months for DM A1c, BPH, HTN, Cardiology.  If you have any other questions or concerns, please feel free to call the office or send a message through [place]. You may also schedule an earlier appointment if necessary.  Additionally, you may be receiving a survey about your experience at our office within a few days to 1 week by e-mail or mail. We value your feedback.  Nobie Putnam, DO [place]

## 2019-08-24 NOTE — Assessment & Plan Note (Signed)
Consistent clinically with new diagnosis BPH, with lower urinary tract symptoms (LUTS) - AUA BPH score 16 (07/2019),  Never on meds Prior Urology Dr Yves Dill did not f/u regular has had mild elevated PSA 3-4 range - No known personal/family history of prostate CA  Plan: 1. Start saw palmetto OTC dosing 80-160 BID - Future consider switch to  Start Tamsulosin 0.4mg  daily, advised on benefits, risks, if BP low caution with sudden standing up or position change

## 2019-08-24 NOTE — Assessment & Plan Note (Signed)
Mostly controlled DM A1c 7.3 No evidence of hypoglycemia Complications - DM cataracts, glaucoma, other including atherosclerotic vascular disease - increases risk of future cardiovascular complications   Plan: 1. CONTINUE Metformin XR 500mg  daily - discussed that no change for now if he can improve lifestyle, if not then may need to go back to BID dosing - OFFERED SGLT2 for protection with cardiovascular and also considered GLP1 he will check ins cost coverage 2. Encourage improved lifestyle - low carb, low sugar diet, reduce portion size, continue improving regular exercise - reviewed diet recommendation 3. Check CBG, bring log to next visit for review 4. Continue ACEi, ASA - future reconsider starting statin - declined today 5. Follow-up 3 months review meds

## 2019-08-24 NOTE — Assessment & Plan Note (Signed)
Elevated BP Monitor home BP closer Continue current meds, f/u with Cardiology as planned, notify sooner if need, consider add back Amlodipine

## 2019-08-24 NOTE — Assessment & Plan Note (Signed)
Stable baseline to improved 3-4 range PSA Prior Dr Yves Dill  Will treat as BPH Monitor PSA in future May return to Uro if need

## 2019-08-25 ENCOUNTER — Telehealth: Payer: Self-pay | Admitting: Cardiovascular Disease

## 2019-08-25 DIAGNOSIS — I10 Essential (primary) hypertension: Secondary | ICD-10-CM

## 2019-08-25 DIAGNOSIS — Z79899 Other long term (current) drug therapy: Secondary | ICD-10-CM

## 2019-08-25 NOTE — Telephone Encounter (Signed)
Called patient.  Reports recent BP's. Denies headache, blurred vision, dizziness. Reviewed med list and he is taking medications at prescribed on med list. 8/22 142/82, 57 - 1 hour after meds 8/27 144/86, 59 - 1 hour after meds  8/28 158/95, 61 - 10 min after meds 8/29 152/99, 56 - 10 min after meds 8/30 153/92, 63 - 10 min after meds 8/31 151/88  50 - middle of the day  Advised him we would like him to take BP about 2 hours after morning meds.  However, this is difficult for him due to his job. Advised I will route to Dr Rockey Situ for review and advice.

## 2019-08-25 NOTE — Telephone Encounter (Signed)
Pt c/o BP issue: STAT if pt c/o blurred vision, one-sided weakness or slurred speech  1. What are your last 5 BP readings?   158/95  152/94  153/92  157/86  151/88  2. Are you having any other symptoms (ex. Dizziness, headache, blurred vision, passed out)? No   3. What is your BP issue?  Elevated more so than usual

## 2019-08-26 NOTE — Telephone Encounter (Signed)
Would add spironolactone 25 mg daily Continue to monitor pressures Recheck BMP in one month

## 2019-08-27 MED ORDER — SPIRONOLACTONE 25 MG PO TABS: 25.0000 mg | ORAL_TABLET | Freq: Every day | ORAL | 6 refills | Status: DC

## 2019-08-27 NOTE — Telephone Encounter (Signed)
I spoke with the patient and advised him of Dr. Donivan Scull recommendations to: 1) Start spironolactone 25 mg- take 1 tablet by mouth once daily. 2) check BP/HR's x 1-2 weeks and call with readings 3) BMP- I advised in 2 weeks- he would like this done at work, I advised I will mail him an order.  The patient voices understanding of the above and is agreeable.

## 2019-09-02 ENCOUNTER — Ambulatory Visit (INDEPENDENT_AMBULATORY_CARE_PROVIDER_SITE_OTHER): Payer: BC Managed Care – PPO

## 2019-09-02 DIAGNOSIS — Z95810 Presence of automatic (implantable) cardiac defibrillator: Secondary | ICD-10-CM

## 2019-09-02 DIAGNOSIS — I5022 Chronic systolic (congestive) heart failure: Secondary | ICD-10-CM | POA: Diagnosis not present

## 2019-09-03 ENCOUNTER — Telehealth: Payer: Self-pay

## 2019-09-03 NOTE — Progress Notes (Signed)
EPIC Encounter for ICM Monitoring  Patient Name: Russell [name]. is a 68 y.o. male Date: 09/03/2019 Primary Care Physican: Olin Hauser, DO Primary Cardiologist: Rockey Situ Electrophysiologist: Allred Weight: unknown      1st ICM remote transmission. Attempted call to patient and unable to reach, voice mail box not set up.   Transmission reviewed.    Optivol thoracic impedance normal.   Labs: 06/26/2019 Creatinine 1.2, BUN 12, Potassium 4.0, Sodium 141 05/10/2019 Creatinine 1.04, BUN 17, Potassium 4.1, Sodium 139  A complete set of results can be found in Results Review.  Recommendations: Unable to reach.    Follow-up plan: ICM clinic phone appointment on 10/08/2019.   91 day device clinic remote transmission 11/10/2019.    Copy of ICM check sent to Dr. Rayann Heman.   3 month ICM trend: 09/02/2019    1 Year ICM trend:       Russell Billings, RN 09/03/2019 10:32 AM

## 2019-09-03 NOTE — Telephone Encounter (Signed)
Remote ICM transmission received.  Attempted call to patient regarding ICM remote transmission and voice mail box has not been set up. °

## 2019-09-12 ENCOUNTER — Ambulatory Visit (INDEPENDENT_AMBULATORY_CARE_PROVIDER_SITE_OTHER): Payer: BC Managed Care – PPO

## 2019-09-12 ENCOUNTER — Other Ambulatory Visit: Payer: Self-pay

## 2019-09-12 DIAGNOSIS — I420 Dilated cardiomyopathy: Secondary | ICD-10-CM | POA: Diagnosis not present

## 2019-09-12 MED ORDER — PERFLUTREN LIPID MICROSPHERE
1.0000 mL | INTRAVENOUS | Status: AC | PRN
  Administered 2019-09-12: 2 mL via INTRAVENOUS

## 2019-09-16 ENCOUNTER — Encounter: Payer: Self-pay | Admitting: Cardiovascular Disease

## 2019-09-17 ENCOUNTER — Telehealth: Payer: Self-pay | Admitting: Cardiovascular Disease

## 2019-09-17 NOTE — Telephone Encounter (Signed)
Patient would like to be called with Echo results when they are available. Please advise

## 2019-09-17 NOTE — Telephone Encounter (Signed)
Echo results awaiting review and signature of MD. Message fwd to Dr. Rockey Situ.

## 2019-09-18 ENCOUNTER — Telehealth: Payer: Self-pay | Admitting: *Deleted

## 2019-09-18 DIAGNOSIS — H401130 Primary open-angle glaucoma, bilateral, stage unspecified: Secondary | ICD-10-CM | POA: Diagnosis not present

## 2019-09-18 NOTE — Telephone Encounter (Signed)
-----   Message from Minna Merritts, MD sent at 09/17/2019 10:26 PM EDT ----- Echo Improvement in cardiac function Previously 20 to 25% now EF is 30 to 35% Would continue current meds,  good news

## 2019-09-18 NOTE — Telephone Encounter (Signed)
Patient informed. 

## 2019-09-18 NOTE — Telephone Encounter (Signed)
See new phone note for details 

## 2019-10-08 ENCOUNTER — Ambulatory Visit (INDEPENDENT_AMBULATORY_CARE_PROVIDER_SITE_OTHER): Payer: BC Managed Care – PPO

## 2019-10-08 DIAGNOSIS — Z95810 Presence of automatic (implantable) cardiac defibrillator: Secondary | ICD-10-CM

## 2019-10-08 DIAGNOSIS — I5022 Chronic systolic (congestive) heart failure: Secondary | ICD-10-CM

## 2019-10-10 NOTE — Progress Notes (Signed)
EPIC Encounter for ICM Monitoring  Patient Name: Russell [name]. is a 68 y.o. male Date: 10/10/2019 Primary Care Physican: Olin Hauser, DO Primary Cardiologist: Rockey Situ Electrophysiologist: Allred 10/10/2019 Weight: 160 lbs                                                          Spoke with patient.  He denies fluid symptoms.  He does not use salt shaker at home but does not look at food labels for salt amounts.   Optivol thoracic impedance trending close to baseline normal.   Labs: 06/26/2019 Creatinine 1.2, BUN 12, Potassium 4.0, Sodium 141 05/10/2019 Creatinine 1.04, BUN 17, Potassium 4.1, Sodium 139  A complete set of results can be found in Results Review.  Recommendations: Reinforced limiting salt intake to < 2000 mg daily.  Encouraged to call if experiencing fluid symptoms.  Follow-up plan: ICM clinic phone appointment on 11/11/2019.   91 day device clinic remote transmission 11/10/2019.  Office appt 10/27/2019 with Dr. Rockey Situ.    Copy of ICM check sent to Dr. Rayann Heman.   3 month ICM trend: 10/08/2019    1 Year ICM trend:       Rosalene Billings, RN 10/10/2019 3:18 PM

## 2019-10-25 NOTE — Progress Notes (Signed)
Cardiology Office Note  Date:  10/27/2019   ID:  Russell Joy., DOB 1951/08/30, MRN [id number]  PCP:  Smitty Cords, DO   Chief Complaint  Patient presents with  . OTHER    3 month f/u c/o left arm pain. Meds reviewed verbally with pt.    HPI:  Russell [name] is a 68 year old gentleman with a history of  diabetes,  Hypertension, who initially presented for abnormal EKG. he had a colonoscopy. During the procedure, noted to have abnormal telemetry. EKG was performed that showed anterolateral wall abnormality.  echocardiogram showed depressed ejection fraction 25-30%.   2014 pharmacologic Myoview suggested mid to distal anterior wall perfusion defect concerning for ischemia. He was started on beta blocker and ACE inhibitor He underwent cardiac catheterization given the perfusion defect and severely depressed ejection fraction.   this showed no significant CAD, ejection fraction had improved up to 40%. He presents today for routine follow-up of his nonischemic cardiomyopathy and hypertension, has ICD  In follow-up today he reports having some side effects from the spironolactone which we added for high blood pressure Reports having some erectile dysfunction issues, headache, dizzy He stopped the medication  Blood pressure 148 to 152 systolic at home  Echo results reviewed with him  1. Left ventricular ejection fraction, by visual estimation, is 30 to 35%.  Up from 25%  Denies shortness of breath or chest pain  EKG personally reviewed by myself on todays visit Shows sinus bradycardia rate 54 bpm anterolateral T wave abnormality  Other past medical history reviewed On 05/03/2019,  playing baseball,  developed chest pain, collapsed,  unresponsive. Bystander started CPR   in ventricular fibrillation and required 5 shocks while in the field with subsequent ROSC. He was intubated  Total downtime estimated at approximately 5 minutes. Cooling protocol.  Troponin peaked at  0.69.  . sustained VT on 05/06/2019 with successful defibrillation in the hospital  started on Amiodarone infusion  05/07/2019,  right and left heart catheterization with mild luminal irregularities   severely reduced LVEF (20-25%) with global hypokinesis  ICD was placed    PMH:   has a past medical history of CAD (coronary artery disease), Diabetes (HCC), Frequent PVCs, Heart failure with reduced ejection fraction (HCC), Hypertension, Impotence of organic origin, NICM (nonischemic cardiomyopathy) (HCC), Pain in joint, shoulder region, Personal history of colonic polyps, and Ventricular tachycardia (HCC).  PSH:    Past Surgical History:  Procedure Laterality Date  . CARDIAC CATHETERIZATION  11/17/2013  . COLONOSCOPY  2009  . ICD IMPLANT N/A 05/08/2019   Procedure: ICD IMPLANT;  Surgeon: Hillis Range, MD;  Location: [facility];  Service: Cardiovascular;  Laterality: N/A;  . RIGHT/LEFT HEART CATH AND CORONARY ANGIOGRAPHY N/A 05/07/2019   Procedure: RIGHT/LEFT HEART CATH AND CORONARY ANGIOGRAPHY;  Surgeon: Yvonne Kendall, MD;  Location: [facility] INVASIVE CV LAB;  Service: Cardiovascular;  Laterality: N/A;    Current Outpatient Medications  Medication Sig Dispense Refill  . amiodarone (PACERONE) 200 MG tablet Take 1 tablet (200 mg total) by mouth daily. 90 tablet 3  . aspirin EC 81 MG tablet Take 1 tablet (81 mg total) by mouth daily.    . brimonidine (ALPHAGAN) 0.2 % ophthalmic solution Place 1 drop into both eyes 2 (two) times daily.    . carvedilol (COREG) 25 MG tablet TAKE 1 TABLET BY MOUTH TWICE A DAY 180 tablet 3  . cetirizine (ZYRTEC) 10 MG tablet Take 10 mg by mouth daily.  4  . Cholecalciferol (VITAMIN  D3) 2000 units TABS Take 1 capsule by mouth daily.     Marland Kitchen glucose blood test strip Use as instructed to check blood sugar up to x 2 daily 100 each 12  . Lancets (ONETOUCH ULTRASOFT) lancets Use as instructed to check blood sugar up to x 2 daily 100 each 12  . latanoprost  (XALATAN) 0.005 % ophthalmic solution Place 1 drop into both eyes at bedtime.     . metFORMIN (GLUCOPHAGE-XR) 500 MG 24 hr tablet TAKE 1 TABLET BY MOUTH EVERY DAY WITH BREAKFAST 90 tablet 3  . nitroGLYCERIN (NITROSTAT) 0.4 MG SL tablet Place 1 tablet (0.4 mg total) under the tongue every 5 (five) minutes x 3 doses as needed for chest pain. 30 tablet 2  . polyvinyl alcohol (LIQUIFILM TEARS) 1.4 % ophthalmic solution Place 2 drops into both eyes every 8 (eight) hours. 15 mL 0  . sacubitril-valsartan (ENTRESTO) 97-103 MG Take 1 tablet by mouth 2 (two) times daily. 60 tablet 11  . saw palmetto 160 MG capsule Take 1 capsule (160 mg total) by mouth 2 (two) times daily.    . timolol (TIMOPTIC) 0.5 % ophthalmic solution Place 1 drop into both eyes 2 (two) times daily.      No current facility-administered medications for this visit.      Allergies:   Patient has no known allergies.   Social History:  The patient  reports that he has never smoked. He has never used smokeless tobacco. He reports current alcohol use. He reports that he does not use drugs.   Family History:   family history includes Colon cancer in his father; Diabetes in his mother; Hypertension in his mother.    Review of Systems: Review of Systems  Constitutional: Negative.   HENT: Negative.   Respiratory: Negative.   Cardiovascular: Negative.   Gastrointestinal: Negative.   Musculoskeletal: Negative.   Neurological: Negative.   Psychiatric/Behavioral: Negative.   All other systems reviewed and are negative.   PHYSICAL EXAM: VS:  Pulse 98   Ht 5\' 8"  (1.727 m)   Wt 159 lb 8 oz (72.3 kg)   SpO2 98%   BMI 24.25 kg/m  , BMI Body mass index is 24.25 kg/m. Constitutional:  oriented to person, place, and time. No distress.  HENT:  Head: Grossly normal Eyes:  no discharge. No scleral icterus.  Neck: No JVD, no carotid bruits  Cardiovascular: Regular rate and rhythm, no murmurs appreciated Pulmonary/Chest: Clear to  auscultation bilaterally, no wheezes or rails Abdominal: Soft.  no distension.  no tenderness.  Musculoskeletal: Normal range of motion Neurological:  normal muscle tone. Coordination normal. No atrophy Skin: Skin warm and dry Psychiatric: normal affect, pleasant   Recent Labs: 05/04/2019: B Natriuretic Peptide 78.0 05/09/2019: Magnesium 2.0 06/26/2019: ALT 16; BUN 12; Creatinine 1.2; Hemoglobin 12.5; Platelets 259; Potassium 4.0; Sodium 141; TSH 1.32    Lipid Panel Lab Results  Component Value Date   CHOL 170 06/26/2019   HDL 70 06/26/2019   LDLCALC 83 06/26/2019   TRIG 84 06/26/2019      Wt Readings from Last 3 Encounters:  10/27/19 159 lb 8 oz (72.3 kg)  08/22/19 158 lb (71.7 kg)  08/11/19 155 lb (70.3 kg)     ASSESSMENT AND PLAN:   Congestive dilated cardiomyopathy (HCC) - Plan: EKG 12-Lead recent cardiac arrest, Blood pressure continues to run high Did not tolerate spironolactone Will start Imdur 30 mg in the evening Could potentially transition to BiDil at a later date.  We  will try to avoid polypharmacy.   Controlled type 2 diabetes mellitus without complication, unspecified long term insulin use status (HCC) -- Prior hemoglobin A1c 7.3 Recommended strict low carbohydrate diet  Total encounter time more than 25 minutes Greater than 50% was spent in counseling and coordination of care with the patient   Disposition:   F/U 3 months   Orders Placed This Encounter  Procedures  . EKG 12-Lead     Signed, Esmond Plants, M.D., Ph.D. 10/27/2019  [place], [place]

## 2019-10-27 ENCOUNTER — Ambulatory Visit (INDEPENDENT_AMBULATORY_CARE_PROVIDER_SITE_OTHER): Payer: BC Managed Care – PPO | Admitting: Cardiovascular Disease

## 2019-10-27 ENCOUNTER — Other Ambulatory Visit: Payer: Self-pay

## 2019-10-27 ENCOUNTER — Encounter: Payer: Self-pay | Admitting: Cardiovascular Disease

## 2019-10-27 VITALS — HR 98 | Ht 68.0 in | Wt 159.5 lb

## 2019-10-27 DIAGNOSIS — I5022 Chronic systolic (congestive) heart failure: Secondary | ICD-10-CM | POA: Diagnosis not present

## 2019-10-27 DIAGNOSIS — Z95810 Presence of automatic (implantable) cardiac defibrillator: Secondary | ICD-10-CM

## 2019-10-27 DIAGNOSIS — I10 Essential (primary) hypertension: Secondary | ICD-10-CM | POA: Diagnosis not present

## 2019-10-27 DIAGNOSIS — E1136 Type 2 diabetes mellitus with diabetic cataract: Secondary | ICD-10-CM

## 2019-10-27 DIAGNOSIS — I7090 Unspecified atherosclerosis: Secondary | ICD-10-CM

## 2019-10-27 DIAGNOSIS — I4901 Ventricular fibrillation: Secondary | ICD-10-CM

## 2019-10-27 DIAGNOSIS — I469 Cardiac arrest, cause unspecified: Secondary | ICD-10-CM | POA: Diagnosis not present

## 2019-10-27 DIAGNOSIS — I420 Dilated cardiomyopathy: Secondary | ICD-10-CM

## 2019-10-27 MED ORDER — CARVEDILOL 25 MG PO TABS: 25.0000 mg | ORAL_TABLET | Freq: Two times a day (BID) | ORAL | 3 refills | Status: DC

## 2019-10-27 MED ORDER — AMIODARONE HCL 200 MG PO TABS: 200.0000 mg | ORAL_TABLET | Freq: Every day | ORAL | 3 refills | Status: DC

## 2019-10-27 MED ORDER — ISOSORBIDE MONONITRATE ER 30 MG PO TB24: 30.0000 mg | ORAL_TABLET | Freq: Every day | ORAL | 5 refills | Status: DC

## 2019-10-27 MED ORDER — SACUBITRIL-VALSARTAN 97-103 MG PO TABS: 1.0000 | ORAL_TABLET | Freq: Two times a day (BID) | ORAL | 3 refills | Status: DC

## 2019-10-27 NOTE — Patient Instructions (Addendum)
Medication Instructions:  Please start imdur 30 mg daily at dinner  If you need a refill on your cardiac medications before your next appointment, please call your pharmacy.    Lab work: No new labs needed   If you have labs (blood work) drawn today and your tests are completely normal, you will receive your results only by: Marland Kitchen MyChart Message (if you have MyChart) OR . A paper copy in the mail If you have any lab test that is abnormal or we need to change your treatment, we will call you to review the results.   Testing/Procedures: No new testing needed   Follow-Up: At [facility], you and your health needs are our priority.  As part of our continuing mission to provide you with exceptional heart care, we have created designated Provider Care Teams.  These Care Teams include your primary Cardiologist (physician) and Advanced Practice Providers (APPs -  Physician Assistants and Nurse Practitioners) who all work together to provide you with the care you need, when you need it.  . You will need a follow up appointment in 3 months   . Providers on your designated Care Team:   . Murray Hodgkins, NP . Christell Faith, PA-C . Marrianne Mood, PA-C  Any Other Special Instructions Will Be Listed Below (If Applicable).  For educational health videos Log in to : [email or web address] Or : [email or web address], password : [email or web address]

## 2019-11-10 ENCOUNTER — Ambulatory Visit (INDEPENDENT_AMBULATORY_CARE_PROVIDER_SITE_OTHER): Payer: BC Managed Care – PPO | Admitting: *Deleted

## 2019-11-10 DIAGNOSIS — I428 Other cardiomyopathies: Secondary | ICD-10-CM

## 2019-11-10 DIAGNOSIS — I472 Ventricular tachycardia, unspecified: Secondary | ICD-10-CM

## 2019-11-11 ENCOUNTER — Ambulatory Visit (INDEPENDENT_AMBULATORY_CARE_PROVIDER_SITE_OTHER): Payer: BC Managed Care – PPO

## 2019-11-11 DIAGNOSIS — I5022 Chronic systolic (congestive) heart failure: Secondary | ICD-10-CM

## 2019-11-11 DIAGNOSIS — Z95810 Presence of automatic (implantable) cardiac defibrillator: Secondary | ICD-10-CM | POA: Diagnosis not present

## 2019-11-11 LAB — CUP PACEART REMOTE DEVICE CHECK
Battery Remaining Longevity: 134 mo
Battery Voltage: 3.05 V
Brady Statistic RV Percent Paced: 0.06 %
Date Time Interrogation Session: 20201116093823
HighPow Impedance: 51 Ohm
Implantable Lead Implant Date: 20200514
Implantable Lead Location: 753860
Implantable Lead Model: 6935
Implantable Pulse Generator Implant Date: 20200514
Lead Channel Impedance Value: 323 Ohm
Lead Channel Impedance Value: 437 Ohm
Lead Channel Pacing Threshold Amplitude: 0.875 V
Lead Channel Pacing Threshold Pulse Width: 0.4 ms
Lead Channel Sensing Intrinsic Amplitude: 15.375 mV
Lead Channel Sensing Intrinsic Amplitude: 15.375 mV
Lead Channel Setting Pacing Amplitude: 2 V
Lead Channel Setting Pacing Pulse Width: 0.4 ms
Lead Channel Setting Sensing Sensitivity: 0.3 mV

## 2019-11-14 NOTE — Progress Notes (Signed)
EPIC Encounter for ICM Monitoring  Patient Name: Russell [name]. is a 68 y.o. male Date: 11/14/2019 Primary Care Physican: Olin Hauser, DO Primary Cardiologist:Gollan Electrophysiologist:Allred 10/10/2019 Weight: 160 lbs  Spoke with patient and denies any fluid symptoms.    Optivol thoracic impedance trending close to baseline normal.  Labs: 09/16/2019 Creatinine 1.24, BUN 17, Potassium 4.6, Sodium 142, GFR 60-70 06/26/2019 Creatinine1.2,   BUN12, Potassium4.0, Sodium141 05/10/2019 Creatinine1.04, BUN17, Potassium4.1, Sodium139 A complete set of results can be found in Results Review.  Recommendations: Encouraged to call if experiencing fluid symptoms.  Follow-up plan: ICM clinic phone appointment on 12/22/2019.   91 day device clinic remote transmission 02/09/2020.  Office appt 01/27/2020 with Dr. Rockey Situ.    Copy of ICM check sent to Dr. Rayann Heman.   3 month ICM trend: 11/10/2019    1 Year ICM trend:       Rosalene Billings, RN 11/14/2019 10:55 AM

## 2019-12-05 NOTE — Progress Notes (Signed)
Remote ICD transmission.   

## 2019-12-12 ENCOUNTER — Other Ambulatory Visit: Payer: Self-pay | Admitting: Cardiovascular Disease

## 2019-12-22 ENCOUNTER — Ambulatory Visit (INDEPENDENT_AMBULATORY_CARE_PROVIDER_SITE_OTHER): Payer: BC Managed Care – PPO

## 2019-12-22 DIAGNOSIS — I5022 Chronic systolic (congestive) heart failure: Secondary | ICD-10-CM | POA: Diagnosis not present

## 2019-12-22 DIAGNOSIS — Z95810 Presence of automatic (implantable) cardiac defibrillator: Secondary | ICD-10-CM

## 2019-12-22 NOTE — Progress Notes (Signed)
EPIC Encounter for ICM Monitoring  Patient Name: Russell [name]. is a 68 y.o. male Date: 12/22/2019 Primary Care Physican: Olin Hauser, DO Primary Cardiologist:Gollan Electrophysiologist:Allred 12/28/2020Weight:160 lbs  Spoke with patient and denies any fluid symptoms.   Optivol thoracic impedancetrending close to baselinenormal.  Labs: 09/16/2019 Creatinine 1.24, BUN 17, Potassium 4.6, Sodium 142, GFR 60-70 06/26/2019 Creatinine1.2,   BUN12, Potassium4.0, Sodium141 05/10/2019 Creatinine1.04, BUN17, Potassium4.1, Sodium139 A complete set of results can be found in Results Review.  Recommendations: Encouraged to call if experiencing fluid symptoms.  Follow-up plan: ICM clinic phone appointment on 01/25/2019.   91 day device clinic remote transmission 02/09/2020.  Office appt 01/27/2020 with Dr. Rockey Situ.    Copy of ICM check sent to Dr. Rayann Heman.   3 month ICM trend: 12/22/2019    1 Year ICM trend:       Rosalene Billings, RN 12/22/2019 3:52 PM

## 2020-01-05 ENCOUNTER — Telehealth: Payer: Self-pay | Admitting: Cardiovascular Disease

## 2020-01-05 NOTE — Telephone Encounter (Signed)
Spoke with patient and he reports having elevated blood pressures. He does state that he takes it at the same time he is taking medication. Instructed him to please check blood pressures 2 hours after medications so we can see how they are running. Also requested that he keep a log of those readings and bring into his visit. Scheduled him to come in tomorrow here in our office. Current blood pressure is 172/107. Moved appointment earlier and reviewed signs and symptoms which would require immediate evaluation in the ED. Advised if blood pressures reach 190/100's that he could try taking a nitro pill but to use caution and do not take while out and about. Also reviewed that he should only take at most 2 per day but that when he comes in on Wednesday they can review medications for other options. Confirmed his blood pressure medications as well. He verbalized understanding of our conversation, agreement with plan, and had no further questions at this time. We did have DOD slot left for tomorrow so reached back out and moved appointment up. He confirmed appointment date and time with no further questions.

## 2020-01-05 NOTE — Telephone Encounter (Signed)
Patient calling back in to speak with nurse, still dizzy with headache

## 2020-01-05 NOTE — Telephone Encounter (Signed)
Pt c/o BP issue: STAT if pt c/o blurred vision, one-sided weakness or slurred speech  1. What are your last 5 BP readings?  1/8 188/109 63 1/9 172/107 87 1/10 179/101 68 1/11 181/108 66  2. Are you having any other symptoms (ex. Dizziness, headache, blurred vision, passed out)? Headache, dizzy upon standing, occasionally blurred vision. Started about a week ago  3. What is your BP issue? high

## 2020-01-06 ENCOUNTER — Ambulatory Visit (INDEPENDENT_AMBULATORY_CARE_PROVIDER_SITE_OTHER): Payer: Medicare HMO | Admitting: Cardiovascular Disease

## 2020-01-06 ENCOUNTER — Encounter: Payer: Self-pay | Admitting: Cardiovascular Disease

## 2020-01-06 ENCOUNTER — Other Ambulatory Visit: Payer: Self-pay

## 2020-01-06 VITALS — BP 150/90 | HR 58 | Ht 68.0 in | Wt 157.8 lb

## 2020-01-06 DIAGNOSIS — I10 Essential (primary) hypertension: Secondary | ICD-10-CM

## 2020-01-06 DIAGNOSIS — I428 Other cardiomyopathies: Secondary | ICD-10-CM | POA: Diagnosis not present

## 2020-01-06 DIAGNOSIS — I472 Ventricular tachycardia, unspecified: Secondary | ICD-10-CM

## 2020-01-06 DIAGNOSIS — Z95810 Presence of automatic (implantable) cardiac defibrillator: Secondary | ICD-10-CM | POA: Diagnosis not present

## 2020-01-06 DIAGNOSIS — I5022 Chronic systolic (congestive) heart failure: Secondary | ICD-10-CM | POA: Diagnosis not present

## 2020-01-06 MED ORDER — ISOSORBIDE MONONITRATE ER 30 MG PO TB24: 30.0000 mg | ORAL_TABLET | Freq: Two times a day (BID) | ORAL | 3 refills | Status: DC

## 2020-01-06 NOTE — Progress Notes (Signed)
Cardiology Office Note  Date:  01/06/2020   ID:  Russell Joy., DOB November 19, 1951, MRN [id number]  PCP:  Smitty Cords, DO   Chief Complaint  Patient presents with  . office visit    elevated blood pressure x 1 week; Meds verbally reviewed with patient.    HPI:  Russell [name] is a 69 year old gentleman with a history of  diabetes,  Hypertension, who initially presented for abnormal EKG. he had a colonoscopy. During the procedure, noted to have abnormal telemetry. EKG was performed that showed anterolateral wall abnormality.  echocardiogram showed depressed ejection fraction 25-30%.   2014 pharmacologic Myoview suggested mid to distal anterior wall perfusion defect concerning for ischemia. He was started on beta blocker and ACE inhibitor He underwent cardiac catheterization given the perfusion defect and severely depressed ejection fraction.   this showed no significant CAD, ejection fraction had improved up to 40%. He presents today for routine follow-up of his nonischemic cardiomyopathy and hypertension, has ICD  Retired last month Denies any significant shortness of breath, chest pain, lower extremity edema, PND orthopnea No near syncope or syncope  Reports his blood pressure has been running high Previously held spironolactone for side effects  Tolerating Coreg twice daily Entresto twice daily Denies excessive fluid intake  Ejection fraction 20 to 25%, most recent echo up to 30 to 35% September 2020  EKG personally reviewed by myself on todays visit Shows sinus bradycardia rate 54 bpm anterolateral T wave abnormality including inferior leads  Other past medical history reviewed On 05/03/2019,  playing baseball,  developed chest pain, collapsed,  unresponsive. Bystander started CPR   in ventricular fibrillation and required 5 shocks while in the field with subsequent ROSC. He was intubated  Total downtime estimated at approximately 5 minutes. Cooling protocol.  Troponin  peaked at 0.69.  sustained VT on 05/06/2019 with successful defibrillation in the hospital  started on Amiodarone infusion  05/07/2019,  right and left heart catheterization with mild luminal irregularities   severely reduced LVEF (20-25%) with global hypokinesis  ICD was placed  PMH:   has a past medical history of CAD (coronary artery disease), Diabetes (HCC), Frequent PVCs, Heart failure with reduced ejection fraction (HCC), Hypertension, Impotence of organic origin, NICM (nonischemic cardiomyopathy) (HCC), Pain in joint, shoulder region, Personal history of colonic polyps, and Ventricular tachycardia (HCC).  PSH:    Past Surgical History:  Procedure Laterality Date  . CARDIAC CATHETERIZATION  11/17/2013  . COLONOSCOPY  2009  . ICD IMPLANT N/A 05/08/2019   Procedure: ICD IMPLANT;  Surgeon: Hillis Range, MD;  Location: [facility];  Service: Cardiovascular;  Laterality: N/A;  . RIGHT/LEFT HEART CATH AND CORONARY ANGIOGRAPHY N/A 05/07/2019   Procedure: RIGHT/LEFT HEART CATH AND CORONARY ANGIOGRAPHY;  Surgeon: Yvonne Kendall, MD;  Location: [facility] INVASIVE CV LAB;  Service: Cardiovascular;  Laterality: N/A;    Current Outpatient Medications  Medication Sig Dispense Refill  . amiodarone (PACERONE) 200 MG tablet Take 1 tablet (200 mg total) by mouth daily. 90 tablet 3  . aspirin EC 81 MG tablet Take 1 tablet (81 mg total) by mouth daily.    . brimonidine (ALPHAGAN) 0.2 % ophthalmic solution Place 1 drop into both eyes 2 (two) times daily.    . carvedilol (COREG) 25 MG tablet Take 1 tablet (25 mg total) by mouth 2 (two) times daily. 180 tablet 3  . Cholecalciferol (VITAMIN D3) 2000 units TABS Take 1 capsule by mouth daily.     Marland Kitchen glucose blood test  strip Use as instructed to check blood sugar up to x 2 daily 100 each 12  . isosorbide mononitrate (IMDUR) 30 MG 24 hr tablet Take 1 tablet (30 mg total) by mouth daily. 30 tablet 5  . Lancets (ONETOUCH ULTRASOFT) lancets Use as instructed  to check blood sugar up to x 2 daily 100 each 12  . latanoprost (XALATAN) 0.005 % ophthalmic solution Place 1 drop into both eyes at bedtime.     . metFORMIN (GLUCOPHAGE-XR) 500 MG 24 hr tablet TAKE 1 TABLET BY MOUTH EVERY DAY WITH BREAKFAST 90 tablet 3  . nitroGLYCERIN (NITROSTAT) 0.4 MG SL tablet Place 1 tablet (0.4 mg total) under the tongue every 5 (five) minutes x 3 doses as needed for chest pain. 30 tablet 2  . polyvinyl alcohol (LIQUIFILM TEARS) 1.4 % ophthalmic solution Place 2 drops into both eyes every 8 (eight) hours. 15 mL 0  . sacubitril-valsartan (ENTRESTO) 97-103 MG Take 1 tablet by mouth 2 (two) times daily. 180 tablet 3  . saw palmetto 160 MG capsule Take 1 capsule (160 mg total) by mouth 2 (two) times daily.    . timolol (TIMOPTIC) 0.5 % ophthalmic solution Place 1 drop into both eyes 2 (two) times daily.      No current facility-administered medications for this visit.    Allergies:   Patient has no known allergies.   Social History:  The patient  reports that he has never smoked. He has never used smokeless tobacco. He reports current alcohol use. He reports that he does not use drugs.   Family History:   family history includes Colon cancer in his father; Diabetes in his mother; Hypertension in his mother.    Review of Systems: Review of Systems  Constitutional: Negative.   HENT: Negative.   Respiratory: Negative.   Cardiovascular: Negative.   Gastrointestinal: Negative.   Musculoskeletal: Negative.   Neurological: Negative.   Psychiatric/Behavioral: Negative.   All other systems reviewed and are negative.   PHYSICAL EXAM: VS:  BP (!) 160/100 (BP Location: Left Arm, Patient Position: Sitting, Cuff Size: Normal)   Pulse (!) 58   Ht 5\' 8"  (1.727 m)   Wt 157 lb 12 oz (71.6 kg)   SpO2 98%   BMI 23.99 kg/m  , BMI Body mass index is 23.99 kg/m. Constitutional:  oriented to person, place, and time. No distress.  HENT:  Head: Grossly normal Eyes:  no  discharge. No scleral icterus.  Neck: No JVD, no carotid bruits  Cardiovascular: Regular rate and rhythm, no murmurs appreciated Pulmonary/Chest: Clear to auscultation bilaterally, no wheezes or rails Abdominal: Soft.  no distension.  no tenderness.  Musculoskeletal: Normal range of motion Neurological:  normal muscle tone. Coordination normal. No atrophy Skin: Skin warm and dry Psychiatric: normal affect, pleasant  Recent Labs: 05/04/2019: B Natriuretic Peptide 78.0 05/09/2019: Magnesium 2.0 06/26/2019: ALT 16; BUN 12; Creatinine 1.2; Hemoglobin 12.5; Platelets 259; Potassium 4.0; Sodium 141; TSH 1.32    Lipid Panel Lab Results  Component Value Date   CHOL 170 06/26/2019   HDL 70 06/26/2019   LDLCALC 83 06/26/2019   TRIG 84 06/26/2019    Wt Readings from Last 3 Encounters:  01/06/20 157 lb 12 oz (71.6 kg)  10/27/19 159 lb 8 oz (72.3 kg)  08/22/19 158 lb (71.7 kg)     ASSESSMENT AND PLAN:  Congestive dilated cardiomyopathy ([place])  Recommend he continue the Entresto, Coreg at current doses Increase isosorbide to 30 mg twice daily Closely monitor blood  pressure and if it continues to run high we may need to add additional agents Potentially could look into BiDil versus higher dose isosorbide   Controlled type 2 diabetes mellitus without complication, unspecified long term insulin use status (HCC) -- Now that he is retired recommended regular walking program, diet modification Most recent A1c 7.3  Total encounter time more than 25 minutes Greater than 50% was spent in counseling and coordination of care with the patient  Disposition:   F/U 6 months   No orders of the defined types were placed in this encounter.    Signed, Dossie Arbour, M.D., Ph.D. 01/06/2020  [facility] [place], [place] [phone]

## 2020-01-06 NOTE — Patient Instructions (Addendum)
Medication Instructions:  Please increase the imdur/isosorbide one pill twice a day Monitor blood perssure CAll if numbers running high   If you need a refill on your cardiac medications before your next appointment, please call your pharmacy.    Lab work: No new labs needed   If you have labs (blood work) drawn today and your tests are completely normal, you will receive your results only by: Marland Kitchen MyChart Message (if you have MyChart) OR . A paper copy in the mail If you have any lab test that is abnormal or we need to change your treatment, we will call you to review the results.   Testing/Procedures: No new testing needed   Follow-Up: At [facility], you and your health needs are our priority.  As part of our continuing mission to provide you with exceptional heart care, we have created designated Provider Care Teams.  These Care Teams include your primary Cardiologist (physician) and Advanced Practice Providers (APPs -  Physician Assistants and Nurse Practitioners) who all work together to provide you with the care you need, when you need it.  . You will need a follow up appointment in 6 months   . Providers on your designated Care Team:   . Nicolasa Ducking, NP . Eula Listen, PA-C . Marisue Ivan, PA-C  Any Other Special Instructions Will Be Listed Below (If Applicable).  For educational health videos Log in to : [email or web address] Or : [email or web address], password : [email or web address]   How to Take Your Blood Pressure You can take your blood pressure at home with a machine. You may need to check your blood pressure at home:  To check if you have high blood pressure (hypertension).  To check your blood pressure over time.  To make sure your blood pressure medicine is working. Supplies needed: You will need a blood pressure machine, or monitor. You can buy one at a drugstore or online. When choosing one:  Choose one with an arm cuff.  Choose one that wraps around your upper  arm. Only one finger should fit between your arm and the cuff.  Do not choose one that measures your blood pressure from your wrist or finger. Your doctor can suggest a monitor. How to prepare Avoid these things for 30 minutes before checking your blood pressure:  Drinking caffeine.  Drinking alcohol.  Eating.  Smoking.  Exercising. Five minutes before checking your blood pressure:  Pee.  Sit in a dining chair. Avoid sitting in a soft couch or armchair.  Be quiet. Do not talk. How to take your blood pressure Follow the instructions that came with your machine. If you have a digital blood pressure monitor, these may be the instructions: 1. Sit up straight. 2. Place your feet on the floor. Do not cross your ankles or legs. 3. Rest your left arm at the level of your heart. You may rest it on a table, desk, or chair. 4. Pull up your shirt sleeve. 5. Wrap the blood pressure cuff around the upper part of your left arm. The cuff should be 1 inch (2.5 cm) above your elbow. It is best to wrap the cuff around bare skin. 6. Fit the cuff snugly around your arm. You should be able to place only one finger between the cuff and your arm. 7. Put the cord inside the groove of your elbow. 8. Press the power button. 9. Sit quietly while the cuff fills with air and loses air. 10. Write down the numbers on the screen.  11. Wait 2-3 minutes and then repeat steps 1-10. What do the numbers mean? Two numbers make up your blood pressure. The first number is called systolic pressure. The second is called diastolic pressure. An example of a blood pressure reading is "120 over 80" (or 120/80). If you are an adult and do not have a medical condition, use this guide to find out if your blood pressure is normal: Normal  First number: below 120.  Second number: below 80. Elevated  First number: 120-129.  Second number: below 80. Hypertension stage 1  First number: 130-139.  Second number:  80-89. Hypertension stage 2  First number: 140 or above.  Second number: 96 or above. Your blood pressure is above normal even if only the top or bottom number is above normal. Follow these instructions at home:  Check your blood pressure as often as your doctor tells you to.  Take your monitor to your next doctor's appointment. Your doctor will: ? Make sure you are using it correctly. ? Make sure it is working right.  Make sure you understand what your blood pressure numbers should be.  Tell your doctor if your medicines are causing side effects. Contact a doctor if:  Your blood pressure keeps being high. Get help right away if:  Your first blood pressure number is higher than 180.  Your second blood pressure number is higher than 120. This information is not intended to replace advice given to you by your health care provider. Make sure you discuss any questions you have with your health care provider. Document Revised: 11/23/2017 Document Reviewed: 05/19/2016 Elsevier Patient Education  2020 [employer].

## 2020-01-07 ENCOUNTER — Ambulatory Visit: Payer: BC Managed Care – PPO | Admitting: Nurse Practitioner

## 2020-01-20 DIAGNOSIS — H401130 Primary open-angle glaucoma, bilateral, stage unspecified: Secondary | ICD-10-CM | POA: Diagnosis not present

## 2020-01-26 ENCOUNTER — Other Ambulatory Visit: Payer: Self-pay

## 2020-01-26 ENCOUNTER — Ambulatory Visit (INDEPENDENT_AMBULATORY_CARE_PROVIDER_SITE_OTHER): Payer: Medicare HMO

## 2020-01-26 DIAGNOSIS — I5022 Chronic systolic (congestive) heart failure: Secondary | ICD-10-CM

## 2020-01-26 DIAGNOSIS — Z95810 Presence of automatic (implantable) cardiac defibrillator: Secondary | ICD-10-CM

## 2020-01-26 MED ORDER — AMIODARONE HCL 200 MG PO TABS: 200.0000 mg | ORAL_TABLET | Freq: Every day | ORAL | 3 refills | Status: DC

## 2020-01-26 MED ORDER — ISOSORBIDE MONONITRATE ER 30 MG PO TB24: 30.0000 mg | ORAL_TABLET | Freq: Two times a day (BID) | ORAL | 3 refills | Status: DC

## 2020-01-26 MED ORDER — SACUBITRIL-VALSARTAN 97-103 MG PO TABS: 1.0000 | ORAL_TABLET | Freq: Two times a day (BID) | ORAL | 3 refills | Status: DC

## 2020-01-26 MED ORDER — CARVEDILOL 25 MG PO TABS: 25.0000 mg | ORAL_TABLET | Freq: Two times a day (BID) | ORAL | 3 refills | Status: DC

## 2020-01-27 ENCOUNTER — Ambulatory Visit: Payer: BC Managed Care – PPO | Admitting: Cardiovascular Disease

## 2020-01-27 ENCOUNTER — Other Ambulatory Visit: Payer: Self-pay

## 2020-01-27 DIAGNOSIS — E1136 Type 2 diabetes mellitus with diabetic cataract: Secondary | ICD-10-CM

## 2020-01-27 MED ORDER — METFORMIN HCL ER 500 MG PO TB24: ORAL_TABLET | ORAL | 1 refills | Status: DC

## 2020-01-28 ENCOUNTER — Telehealth: Payer: Self-pay

## 2020-01-28 DIAGNOSIS — H401130 Primary open-angle glaucoma, bilateral, stage unspecified: Secondary | ICD-10-CM | POA: Diagnosis not present

## 2020-01-28 NOTE — Telephone Encounter (Signed)
Remote ICM transmission received.  Attempted call to patient regarding ICM remote transmission and left detailed message per DPR.  Advised to return call for any fluid symptoms or questions.  

## 2020-01-28 NOTE — Progress Notes (Signed)
EPIC Encounter for ICM Monitoring  Patient Name: Russell [name]. is a 69 y.o. male Date: 01/28/2020 Primary Care Physican: Smitty Cords, DO Primary Cardiologist:Gollan Electrophysiologist:Allred 12/28/2020Weight:160 lbs  Spoke with patientand denies any fluid symptoms.   Optivol thoracic impedancenormal.  Labs: 09/16/2019 Creatinine 1.24, BUN 17, Potassium 4.6, Sodium 142, GFR 60-70 06/26/2019 Creatinine1.2, BUN12, Potassium4.0, Sodium141 05/10/2019 Creatinine1.04, BUN17, Potassium4.1, Sodium139 A complete set of results can be found in Results Review.  Recommendations: Encouraged to call if experiencing fluid symptoms.  Follow-up plan: ICM clinic phone appointment on 03/02/2019. 91 day device clinic remote transmission 02/09/2020. Office appt 7/12/2021with Dr. Mariah Milling.   Copy of ICM check sent to Dr.Allred.   3 month ICM trend: 01/26/2020    1 Year ICM trend:       [name], RN 01/28/2020 11:07 AM

## 2020-01-30 ENCOUNTER — Other Ambulatory Visit: Payer: Self-pay | Admitting: Cardiovascular Disease

## 2020-01-30 NOTE — Telephone Encounter (Signed)
*  STAT* If patient is at the pharmacy, call can be transferred to refill team.   1. Which medications need to be refilled? (please list name of each medication and dose if known) zertec 10mg   2. Which pharmacy/location (including street and city if local pharmacy) is medication to be sent to? [facility]  3. Do they need a 30 day or 90 day supply? 90

## 2020-02-02 ENCOUNTER — Other Ambulatory Visit: Payer: Self-pay

## 2020-02-02 DIAGNOSIS — E1136 Type 2 diabetes mellitus with diabetic cataract: Secondary | ICD-10-CM

## 2020-02-02 MED ORDER — METFORMIN HCL ER 500 MG PO TB24: ORAL_TABLET | ORAL | 1 refills | Status: DC

## 2020-02-02 MED ORDER — AMIODARONE HCL 200 MG PO TABS: 200.0000 mg | ORAL_TABLET | Freq: Every day | ORAL | 3 refills | Status: DC

## 2020-02-02 MED ORDER — CARVEDILOL 25 MG PO TABS: 25.0000 mg | ORAL_TABLET | Freq: Two times a day (BID) | ORAL | 3 refills | Status: DC

## 2020-02-02 NOTE — Telephone Encounter (Signed)
Patient is to contact his primary care physician for a refill on Zyrtec.

## 2020-02-09 ENCOUNTER — Ambulatory Visit (INDEPENDENT_AMBULATORY_CARE_PROVIDER_SITE_OTHER): Payer: Medicare HMO | Admitting: *Deleted

## 2020-02-09 DIAGNOSIS — I428 Other cardiomyopathies: Secondary | ICD-10-CM

## 2020-02-09 LAB — CUP PACEART REMOTE DEVICE CHECK
Battery Remaining Longevity: 133 mo
Battery Voltage: 3.01 V
Brady Statistic RV Percent Paced: 0.07 %
Date Time Interrogation Session: 20210215043721
HighPow Impedance: 55 Ohm
Implantable Lead Implant Date: 20200514
Implantable Lead Location: 753860
Implantable Lead Model: 6935
Implantable Pulse Generator Implant Date: 20200514
Lead Channel Impedance Value: 380 Ohm
Lead Channel Impedance Value: 456 Ohm
Lead Channel Pacing Threshold Amplitude: 0.875 V
Lead Channel Pacing Threshold Pulse Width: 0.4 ms
Lead Channel Sensing Intrinsic Amplitude: 15.25 mV
Lead Channel Sensing Intrinsic Amplitude: 15.25 mV
Lead Channel Setting Pacing Amplitude: 2 V
Lead Channel Setting Pacing Pulse Width: 0.4 ms
Lead Channel Setting Sensing Sensitivity: 0.3 mV

## 2020-02-10 NOTE — Progress Notes (Signed)
ICD Remote  

## 2020-02-20 ENCOUNTER — Telehealth (INDEPENDENT_AMBULATORY_CARE_PROVIDER_SITE_OTHER): Payer: Medicare HMO | Admitting: Internal Medicine

## 2020-02-20 ENCOUNTER — Encounter: Payer: Self-pay | Admitting: Internal Medicine

## 2020-02-20 ENCOUNTER — Telehealth: Payer: Self-pay

## 2020-02-20 VITALS — BP 139/82 | HR 63 | Ht 68.0 in | Wt 156.0 lb

## 2020-02-20 DIAGNOSIS — I472 Ventricular tachycardia, unspecified: Secondary | ICD-10-CM

## 2020-02-20 DIAGNOSIS — I428 Other cardiomyopathies: Secondary | ICD-10-CM

## 2020-02-20 DIAGNOSIS — I5022 Chronic systolic (congestive) heart failure: Secondary | ICD-10-CM

## 2020-02-20 DIAGNOSIS — I110 Hypertensive heart disease with heart failure: Secondary | ICD-10-CM | POA: Diagnosis not present

## 2020-02-20 DIAGNOSIS — Z79899 Other long term (current) drug therapy: Secondary | ICD-10-CM

## 2020-02-20 NOTE — Telephone Encounter (Signed)
-----   Message from Hillis Range, MD sent at 02/20/2020 12:29 PM EST ----- Please order LFTs, TSH PFTs with spirometery and DLCO as he is on amiodarone  He prefers to get these in [place] if able   Return to see me in a year

## 2020-02-20 NOTE — Progress Notes (Signed)
Electrophysiology TeleHealth Note   Due to national recommendations of social distancing due to COVID 19, an audio/video telehealth visit is felt to be most appropriate for this patient at this time.  See MyChart message from today for the patient's consent to telehealth for [facility].  Date:  02/20/2020   ID:  Russell Love., DOB [birth year], MRN [id number]  Location: patient's home  Provider location:  [facility]  Evaluation Performed: Follow-up visit  PCP:  Olin Hauser, DO   Electrophysiologist:  Dr Rayann Heman  Chief Complaint:  CHF  History of Present Illness:    Russell Santagata. is a 69 y.o. male who presents via telehealth conferencing today.  Since last being seen in our clinic, the patient reports doing very well  He is active.  He walks about a mile per day without difficulty.  Today, he denies symptoms of palpitations, chest pain, shortness of breath,  lower extremity edema, dizziness, presyncope, or syncope.  The patient is otherwise without complaint today.  The patient denies symptoms of fevers, chills, cough, or new SOB worrisome for COVID 19.  Past Medical History:  Diagnosis Date  . CAD (coronary artery disease)    05/07/2019 LHC showed nonobstructive CAD  . Diabetes ([place])   . Frequent PVCs   . Heart failure with reduced ejection fraction ([place])    04/2019 EF 20-25%  . Hypertension   . Impotence of organic origin   . NICM (nonischemic cardiomyopathy) ([place])    HFrEF in setting of NICM  . Pain in joint, shoulder region   . Personal history of colonic polyps   . Ventricular tachycardia ([place])    04/2019 in setting of cardiac arrest    Past Surgical History:  Procedure Laterality Date  . CARDIAC CATHETERIZATION  11/17/2013  . COLONOSCOPY  2009  . ICD IMPLANT N/A 05/08/2019   Procedure: ICD IMPLANT;  Surgeon: Thompson Grayer, MD;  Location: [facility];  Service: Cardiovascular;  Laterality: N/A;  . RIGHT/LEFT HEART CATH AND CORONARY  ANGIOGRAPHY N/A 05/07/2019   Procedure: RIGHT/LEFT HEART CATH AND CORONARY ANGIOGRAPHY;  Surgeon: Nelva Bush, MD;  Location: [facility];  Service: Cardiovascular;  Laterality: N/A;    Current Outpatient Medications  Medication Sig Dispense Refill  . amiodarone (PACERONE) 200 MG tablet Take 1 tablet (200 mg total) by mouth daily. 90 tablet 3  . aspirin EC 81 MG tablet Take 1 tablet (81 mg total) by mouth daily.    . brimonidine (ALPHAGAN) 0.2 % ophthalmic solution Place 1 drop into both eyes 2 (two) times daily.    . carvedilol (COREG) 25 MG tablet Take 1 tablet (25 mg total) by mouth 2 (two) times daily. 180 tablet 3  . Cholecalciferol (VITAMIN D3) 2000 units TABS Take 1 capsule by mouth daily.     Marland Kitchen glucose blood test strip Use as instructed to check blood sugar up to x 2 daily 100 each 12  . isosorbide mononitrate (IMDUR) 30 MG 24 hr tablet Take 1 tablet (30 mg total) by mouth 2 (two) times daily. 180 tablet 3  . Lancets (ONETOUCH ULTRASOFT) lancets Use as instructed to check blood sugar up to x 2 daily 100 each 12  . latanoprost (XALATAN) 0.005 % ophthalmic solution Place 1 drop into both eyes at bedtime.     . metFORMIN (GLUCOPHAGE-XR) 500 MG 24 hr tablet TAKE 1 TABLET BY MOUTH EVERY DAY WITH BREAKFAST 90 tablet 1  . nitroGLYCERIN (NITROSTAT) 0.4 MG SL  tablet Place 1 tablet (0.4 mg total) under the tongue every 5 (five) minutes x 3 doses as needed for chest pain. 30 tablet 2  . polyvinyl alcohol (LIQUIFILM TEARS) 1.4 % ophthalmic solution Place 2 drops into both eyes every 8 (eight) hours. 15 mL 0  . sacubitril-valsartan (ENTRESTO) 97-103 MG Take 1 tablet by mouth 2 (two) times daily. 180 tablet 3  . saw palmetto 160 MG capsule Take 1 capsule (160 mg total) by mouth 2 (two) times daily.    . timolol (TIMOPTIC) 0.5 % ophthalmic solution Place 1 drop into both eyes 2 (two) times daily.      No current facility-administered medications for this visit.    Allergies:   Patient  has no known allergies.   Social History:  The patient  reports that he has never smoked. He has never used smokeless tobacco. He reports current alcohol use. He reports that he does not use drugs.   Family History:  The patient's family history includes Colon cancer in his father; Diabetes in his mother; Hypertension in his mother.   ROS:  Please see the history of present illness.   All other systems are personally reviewed and negative.    Exam:    Vital Signs:  BP 139/82   Pulse 63   Ht 5\' 8"  (1.727 m)   Wt 156 lb (70.8 kg)   BMI 23.72 kg/m   Well sounding and appearing, alert and conversant, regular work of breathing,  good skin color Eyes- anicteric, neuro- grossly intact, skin- no apparent rash or lesions or cyanosis, mouth- oral mucosa is pink  Labs/Other Tests and Data Reviewed:    Recent Labs: 05/04/2019: B Natriuretic Peptide 78.0 05/09/2019: Magnesium 2.0 06/26/2019: ALT 16; BUN 12; Creatinine 1.2; Hemoglobin 12.5; Platelets 259; Potassium 4.0; Sodium 141; TSH 1.32   Wt Readings from Last 3 Encounters:  02/20/20 156 lb (70.8 kg)  01/06/20 157 lb 12 oz (71.6 kg)  10/27/19 159 lb 8 oz (72.3 kg)     Last device remote is reviewed from P[employer] PDF which reveals normal device function, no arrhythmias   ASSESSMENT & PLAN:    1.  VT Well controlled with amiodarone The importance of close monitoring of amiodarone to avoid toxicity was discussed at length today. I will order LFTs, TFTs and PFTs today He will need these repeated on follow-up with Dr 13/02/20 in 6 months Remotes are uptodate Normal ICD function Reduce amiodarone to 100mg  daily on return if no further arrhythmias occur. I hope that we are eventually able to wean off of amiodarone.  2. Nonischemic CM/ chronic systolic dysfunction NYHA Class II currently Followed in ICM device clinic  3. Hypertensive cardiovascular disease with systolic CHF Stable No change required today   Follow-up with Dr Mariah Milling as  scheduled I will see in a year   Patient Risk:  after full review of this patients clinical status, I feel that they are at moderate risk at this time.  Today, I have spent 15 minutes with the patient with telehealth technology discussing arrhythmia management .    , MD  02/20/2020 12:28 PM     [facility] [address] [place] [phone] (office) [phone] (fax)

## 2020-02-20 NOTE — Telephone Encounter (Signed)
Pt agrees to having a PFT and labs done and he prefers to have in [place].. labs ordered and will send order for PFT to the [facility] to schedule.

## 2020-03-01 ENCOUNTER — Ambulatory Visit (INDEPENDENT_AMBULATORY_CARE_PROVIDER_SITE_OTHER): Payer: Medicare HMO

## 2020-03-01 DIAGNOSIS — I5022 Chronic systolic (congestive) heart failure: Secondary | ICD-10-CM

## 2020-03-01 DIAGNOSIS — Z95810 Presence of automatic (implantable) cardiac defibrillator: Secondary | ICD-10-CM

## 2020-03-02 ENCOUNTER — Telehealth: Payer: Self-pay

## 2020-03-02 NOTE — Progress Notes (Signed)
EPIC Encounter for ICM Monitoring  Patient Name: Russell [name]. is a 69 y.o. male Date: 03/02/2020 Primary Care Physican: Smitty Cords, DO Primary Cardiologist:Gollan Electrophysiologist:Allred 12/28/2020Weight:160 lbs  Attempted call to patient and unable to reach.  Left message to return call. Transmission reviewed.   Optivol thoracic impedancenormal.  Labs: 09/16/2019 Creatinine 1.24, BUN 17, Potassium 4.6, Sodium 142, GFR 60-70 06/26/2019 Creatinine1.2, BUN12, Potassium4.0, Sodium141 05/10/2019 Creatinine1.04, BUN17, Potassium4.1, Sodium139 A complete set of results can be found in Results Review.  Recommendations: Unable to reach.    Follow-up plan: ICM clinic phone appointment on4/11/2019. 91 day device clinic remote transmission 05/10/2020. Office appt 7/12/2021with Dr. Mariah Milling.   Copy of ICM check sent to Dr.Allred.   3 month ICM trend: 03/01/2020   1 Year ICM trend:       [name], RN 03/02/2020 1:48 PM

## 2020-03-02 NOTE — Telephone Encounter (Signed)
Remote ICM transmission received.  Attempted call to patient regarding ICM remote transmission and mail box is full. 

## 2020-04-05 ENCOUNTER — Ambulatory Visit (INDEPENDENT_AMBULATORY_CARE_PROVIDER_SITE_OTHER): Payer: Medicare HMO

## 2020-04-05 DIAGNOSIS — I5022 Chronic systolic (congestive) heart failure: Secondary | ICD-10-CM

## 2020-04-05 DIAGNOSIS — Z95810 Presence of automatic (implantable) cardiac defibrillator: Secondary | ICD-10-CM

## 2020-04-05 NOTE — Progress Notes (Signed)
EPIC Encounter for ICM Monitoring  Patient Name: Russell [name]. is a 69 y.o. male Date: 04/05/2020 Primary Care Physican: Smitty Cords, DO Primary Cardiologist:Gollan Electrophysiologist:Allred 4/12/2021Weight:158 - 160 lbs  Spoke with patient and reports feeling well at this time.  Denies fluid symptoms.    Optivol thoracic impedancenormal.  Labs: 09/16/2019 Creatinine 1.24, BUN 17, Potassium 4.6, Sodium 142, GFR 60-70 06/26/2019 Creatinine1.2, BUN12, Potassium4.0, Sodium141 05/10/2019 Creatinine1.04, BUN17, Potassium4.1, Sodium139 A complete set of results can be found in Results Review.  Recommendations: No changes and encouraged to call if experiencing any fluid symptoms.   Follow-up plan: ICM clinic phone appointment on5/18/2020. 91 day device clinic remote transmission 05/10/2020. Office appt7/12/2021with Dr. Mariah Milling.   Copy of ICM check sent to Dr.Allred.   3 month ICM trend: 04/05/2020    1 Year ICM trend:       [name], RN 04/05/2020 12:12 PM

## 2020-04-18 ENCOUNTER — Other Ambulatory Visit: Payer: Self-pay | Admitting: Cardiovascular Disease

## 2020-05-03 ENCOUNTER — Other Ambulatory Visit: Payer: Self-pay

## 2020-05-03 ENCOUNTER — Other Ambulatory Visit
Admission: RE | Admit: 2020-05-03 | Discharge: 2020-05-03 | Disposition: A | Discharge status: Home / Self Care | Payer: Medicare HMO | Source: Ambulatory Visit | Attending: Internal Medicine | Admitting: Internal Medicine

## 2020-05-03 DIAGNOSIS — Z20822 Contact with and (suspected) exposure to covid-19: Secondary | ICD-10-CM | POA: Insufficient documentation

## 2020-05-03 DIAGNOSIS — Z01812 Encounter for preprocedural laboratory examination: Secondary | ICD-10-CM | POA: Insufficient documentation

## 2020-05-03 LAB — SARS CORONAVIRUS 2 (TAT 6-24 HRS): SARS Coronavirus 2: NEGATIVE

## 2020-05-04 ENCOUNTER — Other Ambulatory Visit: Payer: Self-pay | Admitting: Internal Medicine

## 2020-05-04 DIAGNOSIS — R0609 Other forms of dyspnea: Secondary | ICD-10-CM

## 2020-05-05 ENCOUNTER — Other Ambulatory Visit: Payer: Self-pay

## 2020-05-05 ENCOUNTER — Ambulatory Visit (INDEPENDENT_AMBULATORY_CARE_PROVIDER_SITE_OTHER): Payer: Medicare HMO | Admitting: Internal Medicine

## 2020-05-05 DIAGNOSIS — R0600 Dyspnea, unspecified: Secondary | ICD-10-CM | POA: Diagnosis not present

## 2020-05-05 DIAGNOSIS — R0609 Other forms of dyspnea: Secondary | ICD-10-CM

## 2020-05-05 LAB — PULMONARY FUNCTION TEST
DL/VA % pred: 94 %
DL/VA: 3.91 ml/min/mmHg/L
DLCO cor % pred: 85 %
DLCO cor: 21.25 ml/min/mmHg
DLCO unc % pred: 85 %
DLCO unc: 21.25 ml/min/mmHg
FEF 25-75 Post: 3.56 L/sec
FEF 25-75 Pre: 3.03 L/sec
FEF2575-%Change-Post: 17 %
FEF2575-%Pred-Post: 143 %
FEF2575-%Pred-Pre: 122 %
FEV1-%Change-Post: 2 %
FEV1-%Pred-Post: 106 %
FEV1-%Pred-Pre: 103 %
FEV1-Post: 2.92 L
FEV1-Pre: 2.85 L
FEV1FVC-%Change-Post: 1 %
FEV1FVC-%Pred-Pre: 106 %
FEV6-%Change-Post: 0 %
FEV6-%Pred-Post: 100 %
FEV6-%Pred-Pre: 100 %
FEV6-Post: 3.48 L
FEV6-Pre: 3.45 L
FEV6FVC-%Change-Post: 0 %
FEV6FVC-%Pred-Post: 104 %
FEV6FVC-%Pred-Pre: 104 %
FVC-%Change-Post: 0 %
FVC-%Pred-Post: 96 %
FVC-%Pred-Pre: 95 %
FVC-Post: 3.48 L
FVC-Pre: 3.46 L
Post FEV1/FVC ratio: 84 %
Post FEV6/FVC ratio: 100 %
Pre FEV1/FVC ratio: 82 %
Pre FEV6/FVC Ratio: 100 %
RV % pred: 92 %
RV: 2.08 L
TLC % pred: 87 %
TLC: 5.8 L

## 2020-05-05 NOTE — Progress Notes (Signed)
PFT done today. 

## 2020-05-10 ENCOUNTER — Ambulatory Visit (INDEPENDENT_AMBULATORY_CARE_PROVIDER_SITE_OTHER): Payer: Medicare HMO | Admitting: *Deleted

## 2020-05-10 DIAGNOSIS — I428 Other cardiomyopathies: Secondary | ICD-10-CM | POA: Diagnosis not present

## 2020-05-10 LAB — CUP PACEART REMOTE DEVICE CHECK
Battery Remaining Longevity: 131 mo
Battery Voltage: 2.98 V
Brady Statistic RV Percent Paced: 0.1 %
Date Time Interrogation Session: 20210517001703
HighPow Impedance: 59 Ohm
Implantable Lead Implant Date: 20200514
Implantable Lead Location: 753860
Implantable Lead Model: 6935
Implantable Pulse Generator Implant Date: 20200514
Lead Channel Impedance Value: 342 Ohm
Lead Channel Impedance Value: 456 Ohm
Lead Channel Pacing Threshold Amplitude: 0.875 V
Lead Channel Pacing Threshold Pulse Width: 0.4 ms
Lead Channel Sensing Intrinsic Amplitude: 17 mV
Lead Channel Sensing Intrinsic Amplitude: 17 mV
Lead Channel Setting Pacing Amplitude: 2 V
Lead Channel Setting Pacing Pulse Width: 0.4 ms
Lead Channel Setting Sensing Sensitivity: 0.3 mV

## 2020-05-10 NOTE — Progress Notes (Signed)
Remote ICD transmission.   

## 2020-05-11 ENCOUNTER — Ambulatory Visit (INDEPENDENT_AMBULATORY_CARE_PROVIDER_SITE_OTHER): Payer: Medicare HMO

## 2020-05-11 DIAGNOSIS — Z95810 Presence of automatic (implantable) cardiac defibrillator: Secondary | ICD-10-CM

## 2020-05-11 DIAGNOSIS — I5022 Chronic systolic (congestive) heart failure: Secondary | ICD-10-CM

## 2020-05-12 ENCOUNTER — Telehealth: Payer: Self-pay

## 2020-05-12 NOTE — Progress Notes (Signed)
EPIC Encounter for ICM Monitoring  Patient Name: Russell [name]. is a 69 y.o. male Date: 05/12/2020 Primary Care Physican: Smitty Cords, DO Primary Cardiologist:Gollan Electrophysiologist:Allred 4/12/2021Weight:158 - 160 lbs  Attempted call to patient and unable to reach.  Transmission reviewed.   Optivol thoracic impedancenormal.  Labs: 09/16/2019 Creatinine 1.24, BUN 17, Potassium 4.6, Sodium 142, GFR 60-70 06/26/2019 Creatinine1.2, BUN12, Potassium4.0, Sodium141 05/10/2019 Creatinine1.04, BUN17, Potassium4.1, Sodium139 A complete set of results can be found in Results Review.  Recommendations: Unable to reach.    Follow-up plan: ICM clinic phone appointment on6/21/2020. 91 day device clinic remote transmission8/16/2021. Office appt7/12/2021with Dr. Mariah Milling.   Copy of ICM check sent to Dr.Allred.  3 month ICM trend: 05/11/2020    1 Year ICM trend:       [name], RN 05/12/2020 12:07 PM

## 2020-05-12 NOTE — Telephone Encounter (Signed)
Remote ICM transmission received.  Attempted call to patient regarding ICM remote transmission and no answer.  Mail box not set up.

## 2020-06-14 ENCOUNTER — Ambulatory Visit (INDEPENDENT_AMBULATORY_CARE_PROVIDER_SITE_OTHER): Payer: Medicare HMO

## 2020-06-14 DIAGNOSIS — Z95810 Presence of automatic (implantable) cardiac defibrillator: Secondary | ICD-10-CM | POA: Diagnosis not present

## 2020-06-14 DIAGNOSIS — I5022 Chronic systolic (congestive) heart failure: Secondary | ICD-10-CM

## 2020-06-16 ENCOUNTER — Telehealth: Payer: Self-pay

## 2020-06-16 NOTE — Telephone Encounter (Signed)
Copied from CRM [id number]. Topic: General - Inquiry >> Jun 16, 2020 11:26 AM Crist Infante wrote: Reason for CRM: Caitlyn with [facility] states she has sent several faxes concerning drug therapy alert concerning statin use,. But not heard back. Cb  [phone]

## 2020-06-16 NOTE — Telephone Encounter (Signed)
Did not receive this form today (06/16/20).  Answer would be [X]  No - Patient has declined starting statin despite counseling and recommendation to start therapy.  , DO [facility] 06/16/2020, 6:26 PM

## 2020-06-16 NOTE — Telephone Encounter (Signed)
Spoke to [facility] wants reply back for yes/no answer regarding statin recommendation and fax it back to [facility].

## 2020-06-16 NOTE — Progress Notes (Signed)
EPIC Encounter for ICM Monitoring  Patient Name: Russell [name]. is a 69 y.o. male Date: 06/16/2020 Primary Care Physican: Smitty Cords, DO Primary Cardiologist:Gollan Electrophysiologist:Allred 04/05/2020 OfficeWeight:158 - 160lbs  Transmission reviewed.   Optivol thoracic impedancenormal.  Labs: 09/16/2019 Creatinine 1.24, BUN 17, Potassium 4.6, Sodium 142, GFR 60-70 06/26/2019 Creatinine1.2, BUN12, Potassium4.0, Sodium141 05/10/2019 Creatinine1.04, BUN17, Potassium4.1, Sodium139 A complete set of results can be found in Results Review.  Recommendations: None  Follow-up plan: ICM clinic phone appointment on 07/20/2019. 91 day device clinic remote transmission8/16/2021. Office appt7/12/2021with Dr. Mariah Milling.   Copy of ICM check sent to Dr.Allred.  3 month ICM trend: 06/14/2020    1 Year ICM trend:       [name], RN 06/16/2020 11:31 AM

## 2020-07-03 NOTE — Progress Notes (Signed)
Cardiology Office Note  Date:  07/05/2020   ID:  Russell Love., DOB 1951-05-04, MRN [id number]  PCP:  Olin Hauser, DO   Chief Complaint  Patient presents with  . Other    6 month follow up. Patient c/o "hot flashes" / SOB when getting up to go to the bathroom.  Meds reviewed verbally with patient.     HPI:  Russell [name] is a 69 year old gentleman with a history of  diabetes,  Hypertension, who initially presented for abnormal EKG. he had a colonoscopy. During the procedure, noted to have abnormal telemetry. EKG was performed that showed anterolateral wall abnormality.  echocardiogram showed depressed ejection fraction 25-30%.   2014 pharmacologic Myoview suggested mid to distal anterior wall perfusion defect concerning for ischemia. He was started on beta blocker and ACE inhibitor He underwent cardiac catheterization given the perfusion defect and severely depressed ejection fraction.   this showed no significant CAD, ejection fraction had improved up to 40%. He presents today for routine follow-up of his nonischemic cardiomyopathy and hypertension, has ICD  Last seen in clinic January 06, 2020 At that time he had retired Blood pressure was running high on that visit He held spironolactone secondary to side effects Tolerating Coreg and Entresto Tolerating isosorbide 30 twice daily  Ejection fraction 20 to 25%,  Follow-up echo up to 30 to 35% September 2020  Followed by EP in [place] optivol has been staying low  On today's visit blood pressure continues to run high   often 347-425 systolic worsening as the day goes on No shortness of breath or chest discomfort  Continues to have some hot flashes after urinating in the middle of the night, worse when he lays down, has to kick off the covers  Does not feel lightheaded/orthostatic  Has not checked heart rate or blood pressure when he has these episodes  EKG personally reviewed by myself on todays visit Shows   normal sinus rhythm rate 60 bpm anterolateral T wave abnormality including inferior leads  Other past medical history reviewed On 05/03/2019,  playing baseball,  developed chest pain, collapsed,  unresponsive. Bystander started CPR   in ventricular fibrillation and required 5 shocks while in the field with subsequent ROSC. He was intubated  Total downtime estimated at approximately 5 minutes. Cooling protocol.  Troponin peaked at 0.69.  sustained VT on 05/06/2019 with successful defibrillation in the hospital  started on Amiodarone infusion  05/07/2019,  right and left heart catheterization with mild luminal irregularities   severely reduced LVEF (20-25%) with global hypokinesis  ICD was placed  PMH:   has a past medical history of CAD (coronary artery disease), Diabetes ([place]), Frequent PVCs, Heart failure with reduced ejection fraction ([place]), Hypertension, Impotence of organic origin, NICM (nonischemic cardiomyopathy) ([place]), Pain in joint, shoulder region, Personal history of colonic polyps, and Ventricular tachycardia ([place]).  PSH:    Past Surgical History:  Procedure Laterality Date  . CARDIAC CATHETERIZATION  11/17/2013  . COLONOSCOPY  2009  . ICD IMPLANT N/A 05/08/2019   Procedure: ICD IMPLANT;  Surgeon: Thompson Grayer, MD;  Location: [facility];  Service: Cardiovascular;  Laterality: N/A;  . RIGHT/LEFT HEART CATH AND CORONARY ANGIOGRAPHY N/A 05/07/2019   Procedure: RIGHT/LEFT HEART CATH AND CORONARY ANGIOGRAPHY;  Surgeon: Nelva Bush, MD;  Location: [facility];  Service: Cardiovascular;  Laterality: N/A;    Current Outpatient Medications  Medication Sig Dispense Refill  . amiodarone (PACERONE) 200 MG tablet Take 1 tablet (200 mg total)  by mouth daily. 90 tablet 3  . brimonidine (ALPHAGAN) 0.2 % ophthalmic solution Place 1 drop into both eyes 2 (two) times daily.    . carvedilol (COREG) 25 MG tablet Take 1 tablet (25 mg total) by mouth 2 (two) times daily. 180  tablet 3  . Cholecalciferol (VITAMIN D3) 2000 units TABS Take 1 capsule by mouth daily.     Marland Kitchen glucose blood test strip Use as instructed to check blood sugar up to x 2 daily 100 each 12  . isosorbide mononitrate (IMDUR) 60 MG 24 hr tablet Take 1 tablet (60 mg total) by mouth 2 (two) times daily. 180 tablet 3  . Lancets (ONETOUCH ULTRASOFT) lancets Use as instructed to check blood sugar up to x 2 daily 100 each 12  . latanoprost (XALATAN) 0.005 % ophthalmic solution Place 1 drop into both eyes at bedtime.     . metFORMIN (GLUCOPHAGE-XR) 500 MG 24 hr tablet TAKE 1 TABLET BY MOUTH EVERY DAY WITH BREAKFAST 90 tablet 1  . nitroGLYCERIN (NITROSTAT) 0.4 MG SL tablet Place 1 tablet (0.4 mg total) under the tongue every 5 (five) minutes x 3 doses as needed for chest pain. 30 tablet 2  . polyvinyl alcohol (LIQUIFILM TEARS) 1.4 % ophthalmic solution Place 2 drops into both eyes every 8 (eight) hours. 15 mL 0  . sacubitril-valsartan (ENTRESTO) 97-103 MG Take 1 tablet by mouth 2 (two) times daily. 180 tablet 3  . saw palmetto 160 MG capsule Take 1 capsule (160 mg total) by mouth 2 (two) times daily.    . timolol (TIMOPTIC) 0.5 % ophthalmic solution Place 1 drop into both eyes 2 (two) times daily.      No current facility-administered medications for this visit.    Allergies:   Patient has no known allergies.   Social History:  The patient  reports that he has never smoked. He has never used smokeless tobacco. He reports current alcohol use. He reports that he does not use drugs.   Family History:   family history includes Colon cancer in his father; Diabetes in his mother; Hypertension in his mother.    Review of Systems: Review of Systems  Constitutional: Negative.   HENT: Negative.   Respiratory: Negative.   Cardiovascular: Negative.   Gastrointestinal: Negative.   Musculoskeletal: Negative.   Neurological: Negative.        Flushing at nighttime after urination  Psychiatric/Behavioral:  Negative.   All other systems reviewed and are negative.   PHYSICAL EXAM: VS:  BP (!) 164/98 (BP Location: Left Arm, Patient Position: Sitting, Cuff Size: Normal)   Pulse 60   Ht 5' 8"  (1.727 m)   Wt 157 lb (71.2 kg)   SpO2 98%   BMI 23.87 kg/m  , BMI Body mass index is 23.87 kg/m. Constitutional:  oriented to person, place, and time. No distress.  HENT:  Head: Normocephalic and atraumatic.  Eyes:  no discharge. No scleral icterus.  Neck: Normal range of motion. Neck supple. No JVD present.  Cardiovascular: Normal rate, regular rhythm, normal heart sounds and intact distal pulses. Exam reveals no gallop and no friction rub. No edema No murmur heard. Pulmonary/Chest: Effort normal and breath sounds normal. No stridor. No respiratory distress.  no wheezes.  no rales.  no tenderness.  Abdominal: Soft.  no distension.  no tenderness.  Musculoskeletal: Normal range of motion.  no  tenderness or deformity.  Neurological:  normal muscle tone. Coordination normal. No atrophy Skin: Skin is warm and dry. No  rash noted. not diaphoretic.  Psychiatric:  normal mood and affect. behavior is normal. Thought content normal.    Recent Labs: No results found for requested labs within last 8760 hours.    Lipid Panel Lab Results  Component Value Date   CHOL 170 06/26/2019   HDL 70 06/26/2019   LDLCALC 83 06/26/2019   TRIG 84 06/26/2019    Wt Readings from Last 3 Encounters:  07/05/20 157 lb (71.2 kg)  02/20/20 156 lb (70.8 kg)  01/06/20 157 lb 12 oz (71.6 kg)     ASSESSMENT AND PLAN:  Congestive dilated cardiomyopathy (HCC)  Blood pressure running high, even at home  Continue Coreg and Entresto recommend increase isosorbide up to 60 twice daily May need to add hydralazine on call back if numbers continue to run high Recommend he call us with blood pressure measurements Labs today also for amiodarone surveillance  Nonischemic cardiomyopathy Nonobstructive coronary disease by prior  catheterization Unable to exclude hypertensive heart disease Working on his blood pressure as above  Controlled type 2 diabetes mellitus without complication, unspecified long term insulin use status ([place]) -- Now that he is retired recommended regular walking program, diet modification  A1c 7.3 Weight stable, now retired  Nonischemic cardiomyopathy No coronary disease on previous cardiac catheterization  Essential hypertension Management as above Increase isosorbide as above May need hydralazine Stressed importance of aggressive blood pressure control given his cardiomyopathy  Total encounter time more than 25 minutes Greater than 50% was spent in counseling and coordination of care with the patient  Disposition:   F/U 6 months   Orders Placed This Encounter  Procedures  . Comp Met (CMET)  . TSH  . EKG 12-Lead     Signed, Esmond Plants, M.D., Ph.D. 07/05/2020  [place], [place]

## 2020-07-05 ENCOUNTER — Ambulatory Visit (INDEPENDENT_AMBULATORY_CARE_PROVIDER_SITE_OTHER): Payer: Medicare HMO | Admitting: Cardiovascular Disease

## 2020-07-05 ENCOUNTER — Other Ambulatory Visit: Payer: Self-pay

## 2020-07-05 ENCOUNTER — Encounter: Payer: Self-pay | Admitting: Cardiovascular Disease

## 2020-07-05 VITALS — BP 164/98 | HR 60 | Ht 68.0 in | Wt 157.0 lb

## 2020-07-05 DIAGNOSIS — I110 Hypertensive heart disease with heart failure: Secondary | ICD-10-CM | POA: Diagnosis not present

## 2020-07-05 DIAGNOSIS — Z95810 Presence of automatic (implantable) cardiac defibrillator: Secondary | ICD-10-CM

## 2020-07-05 DIAGNOSIS — I472 Ventricular tachycardia, unspecified: Secondary | ICD-10-CM

## 2020-07-05 DIAGNOSIS — E1136 Type 2 diabetes mellitus with diabetic cataract: Secondary | ICD-10-CM | POA: Diagnosis not present

## 2020-07-05 DIAGNOSIS — I5022 Chronic systolic (congestive) heart failure: Secondary | ICD-10-CM

## 2020-07-05 DIAGNOSIS — I7090 Unspecified atherosclerosis: Secondary | ICD-10-CM

## 2020-07-05 DIAGNOSIS — I4901 Ventricular fibrillation: Secondary | ICD-10-CM

## 2020-07-05 DIAGNOSIS — I428 Other cardiomyopathies: Secondary | ICD-10-CM

## 2020-07-05 MED ORDER — ISOSORBIDE MONONITRATE ER 60 MG PO TB24: 60.0000 mg | ORAL_TABLET | Freq: Two times a day (BID) | ORAL | 3 refills | Status: DC

## 2020-07-05 NOTE — Patient Instructions (Addendum)
Medication Instructions:  Please increase the imdur/isosorbide up to 60 mg twice a day  Monitor blood pressure, call in 2 weeks with blood pressure numbers   Make sure to check Blood Pressures 2 hours after your medications.   Please keep a log of your readings so we can see how they trend.  Avoid these things for 30 minutes before checking your blood pressure:  Drinking caffeine.  Drinking alcohol.  Eating.  Smoking.  Exercising.  Five minutes before checking your blood pressure:  Pee.  Sit in a dining chair. Avoid sitting in a soft couch or armchair.  Be quiet. Do not talk.  If you need a refill on your cardiac medications before your next appointment, please call your pharmacy.    Lab work: CMP, TSH today   If you have labs (blood work) drawn today and your tests are completely normal, you will receive your results only by:  MyChart Message (if you have MyChart) OR  A paper copy in the mail If you have any lab test that is abnormal or we need to change your treatment, we will call you to review the results.   Testing/Procedures: No new testing needed   Follow-Up: At [facility], you and your health needs are our priority.  As part of our continuing mission to provide you with exceptional heart care, we have created designated Provider Care Teams.  These Care Teams include your primary Cardiologist (physician) and Advanced Practice Providers (APPs -  Physician Assistants and Nurse Practitioners) who all work together to provide you with the care you need, when you need it.   You will need a follow up appointment in 6 months   Providers on your designated Care Team:    Nicolasa Ducking, NP  Eula Listen, PA-C  Marisue Ivan, PA-C  Any Other Special Instructions Will Be Listed Below (If Applicable).  COVID-19 Vaccine Information can be found at: [email or web address] For questions related to  vaccine distribution or appointments, please email [email or web address] or call [phone].

## 2020-07-06 LAB — COMPREHENSIVE METABOLIC PANEL
ALT: 19 IU/L (ref 0–44)
AST: 14 IU/L (ref 0–40)
Albumin/Globulin Ratio: 1.6 (ref 1.2–2.2)
Albumin: 4.2 g/dL (ref 3.8–4.8)
Alkaline Phosphatase: 53 IU/L (ref 48–121)
BUN/Creatinine Ratio: 15 (ref 10–24)
BUN: 19 mg/dL (ref 8–27)
Bilirubin Total: 0.4 mg/dL (ref 0.0–1.2)
CO2: 24 mmol/L (ref 20–29)
Calcium: 9.5 mg/dL (ref 8.6–10.2)
Chloride: 106 mmol/L (ref 96–106)
Creatinine, Ser: 1.23 mg/dL (ref 0.76–1.27)
GFR calc Af Amer: 70 mL/min/{1.73_m2} (ref >59)
GFR calc non Af Amer: 61 mL/min/{1.73_m2} (ref >59)
Globulin, Total: 2.6 g/dL (ref 1.5–4.5)
Glucose: 195 mg/dL — ABNORMAL HIGH (ref 65–99)
Potassium: 4.4 mmol/L (ref 3.5–5.2)
Sodium: 140 mmol/L (ref 134–144)
Total Protein: 6.8 g/dL (ref 6.0–8.5)

## 2020-07-06 LAB — TSH: TSH: 0.848 u[IU]/mL (ref 0.450–4.500)

## 2020-07-14 ENCOUNTER — Telehealth: Payer: Self-pay | Admitting: Cardiovascular Disease

## 2020-07-14 DIAGNOSIS — I5022 Chronic systolic (congestive) heart failure: Secondary | ICD-10-CM

## 2020-07-14 DIAGNOSIS — I428 Other cardiomyopathies: Secondary | ICD-10-CM

## 2020-07-14 NOTE — Patient Instructions (Signed)
How to Take Your Blood Pressure You can take your blood pressure at home with a machine. You may need to check your blood pressure at home:  To check if you have high blood pressure (hypertension).  To check your blood pressure over time.  To make sure your blood pressure medicine is working. Supplies needed: You will need a blood pressure machine, or monitor. You can buy one at a drugstore or online. When choosing one:  Choose one with an arm cuff.  Choose one that wraps around your upper arm. Only one finger should fit between your arm and the cuff.  Do not choose one that measures your blood pressure from your wrist or finger. Your doctor can suggest a monitor. How to prepare Avoid these things for 30 minutes before checking your blood pressure:  Drinking caffeine.  Drinking alcohol.  Eating.  Smoking.  Exercising. Five minutes before checking your blood pressure:  Pee.  Sit in a dining chair. Avoid sitting in a soft couch or armchair.  Be quiet. Do not talk. How to take your blood pressure Follow the instructions that came with your machine. If you have a digital blood pressure monitor, these may be the instructions: 1. Sit up straight. 2. Place your feet on the floor. Do not cross your ankles or legs. 3. Rest your left arm at the level of your heart. You may rest it on a table, desk, or chair. 4. Pull up your shirt sleeve. 5. Wrap the blood pressure cuff around the upper part of your left arm. The cuff should be 1 inch (2.5 cm) above your elbow. It is best to wrap the cuff around bare skin. 6. Fit the cuff snugly around your arm. You should be able to place only one finger between the cuff and your arm. 7. Put the cord inside the groove of your elbow. 8. Press the power button. 9. Sit quietly while the cuff fills with air and loses air. 10. Write down the numbers on the screen. 11. Wait 2-3 minutes and then repeat steps 1-10. What do the numbers mean? Two  numbers make up your blood pressure. The first number is called systolic pressure. The second is called diastolic pressure. An example of a blood pressure reading is "120 over 80" (or 120/80). If you are an adult and do not have a medical condition, use this guide to find out if your blood pressure is normal: Normal  First number: below 120.  Second number: below 80. Elevated  First number: 120-129.  Second number: below 80. Hypertension stage 1  First number: 130-139.  Second number: 80-89. Hypertension stage 2  First number: 140 or above.  Second number: 90 or above. Your blood pressure is above normal even if only the top or bottom number is above normal. Follow these instructions at home:  Check your blood pressure as often as your doctor tells you to.  Take your monitor to your next doctor's appointment. Your doctor will: ? Make sure you are using it correctly. ? Make sure it is working right.  Make sure you understand what your blood pressure numbers should be.  Tell your doctor if your medicines are causing side effects. Contact a doctor if:  Your blood pressure keeps being high. Get help right away if:  Your first blood pressure number is higher than 180.  Your second blood pressure number is higher than 120. This information is not intended to replace advice given to you by your health   care provider. Make sure you discuss any questions you have with your health care provider. Document Revised: 11/23/2017 Document Reviewed: 05/19/2016 Elsevier Patient Education  2020 Elsevier Inc.  

## 2020-07-14 NOTE — Telephone Encounter (Signed)
Pt c/o BP issue: STAT if pt c/o blurred vision, one-sided weakness or slurred speech  1. What are your last 5 BP readings?     7/12 143/91  60 7/13 156/90  59 7/14 150/91  59 7/15 156/89  88  7/19 151/93  58 7/20 170/91  67  7/21 154/100 58   2. Are you having any other symptoms (ex. Dizziness, headache, blurred vision, passed out)? Denies any symptoms   3. What is your BP issue? Continued htn after last adjustment   Please call with careplan

## 2020-07-14 NOTE — Telephone Encounter (Signed)
Spoke with patient and reviewed blood pressure readings. Inquired about when he is checking them and he reports that they are about the same before and after medications. Advised that I would forward these readings to his provider for further review and recommendations. He verbalized understanding with no further questions.

## 2020-07-15 MED ORDER — ISOSORBIDE MONONITRATE ER 60 MG PO TB24
90.0000 mg | ORAL_TABLET | Freq: Two times a day (BID) | ORAL | 3 refills | Status: DC
Start: 2020-07-15 — End: 2021-01-12

## 2020-07-15 MED ORDER — SPIRONOLACTONE 25 MG PO TABS
25.0000 mg | ORAL_TABLET | Freq: Every day | ORAL | 3 refills | Status: DC
Start: 2020-07-15 — End: 2021-01-12

## 2020-07-15 NOTE — Telephone Encounter (Signed)
Would increase isosorbide mononitrate up to 90 twice daily, Add spironolactone 25 mg daily BMP in 3 to 4 weeks

## 2020-07-15 NOTE — Telephone Encounter (Signed)
Spoke with patient and reviewed provider recommendations with medication changes. Also reviewed that once he starts that medication to have labs done in 3-4 weeks at the [facility] entrance. He did request that I send through his mail order pharmacy as well. Patient is to increase isosorbide mononitrate 90 mg twice a day and then add spironolactone 25 mg once daily. He verbalized understanding of our conversation, read back medication changes, and had no further questions at this time.

## 2020-07-19 ENCOUNTER — Ambulatory Visit (INDEPENDENT_AMBULATORY_CARE_PROVIDER_SITE_OTHER): Payer: Medicare HMO

## 2020-07-19 DIAGNOSIS — Z95810 Presence of automatic (implantable) cardiac defibrillator: Secondary | ICD-10-CM | POA: Diagnosis not present

## 2020-07-19 DIAGNOSIS — I5022 Chronic systolic (congestive) heart failure: Secondary | ICD-10-CM

## 2020-07-23 ENCOUNTER — Telehealth: Payer: Self-pay

## 2020-07-23 NOTE — Progress Notes (Signed)
EPIC Encounter for ICM Monitoring  Patient Name: Russell [name]. is a 69 y.o. male Date: 07/23/2020 Primary Care Physican: Smitty Cords, DO Primary Cardiologist:Gollan Electrophysiologist:Allred 07/23/2020 OfficeWeight:158 - 160lbs  Spoke with patient and reports feeling well at this time.  Denies fluid symptoms.    Optivol thoracic impedancenormal.  Labs: 07/05/2020 Creatinine 1.23, BUN 19, Potassium 4.4, Sodium 140, GFR 61-70 A complete set of results can be found in Results Review.  Recommendations:No changes and encouraged to call if experiencing any fluid symptoms.  Follow-up plan: ICM clinic phone appointment on 08/23/2020. 91 day device clinic remote transmission8/16/2021.    Copy of ICM check sent to Dr.Allred.   3 month ICM trend: 07/23/2020    1 Year ICM trend:       [name], RN 07/23/2020 1:55 PM

## 2020-07-23 NOTE — Telephone Encounter (Signed)
I called the pt to let him know we did not receive his transmission for Monday. He agreed to send one today. I also gave him the number to [facility] tech support in case he needs help trouble shooting the monitor.

## 2020-07-26 DIAGNOSIS — H401130 Primary open-angle glaucoma, bilateral, stage unspecified: Secondary | ICD-10-CM | POA: Diagnosis not present

## 2020-08-03 ENCOUNTER — Telehealth: Payer: Self-pay | Admitting: Cardiovascular Disease

## 2020-08-05 ENCOUNTER — Other Ambulatory Visit: Payer: Self-pay | Admitting: Family Medicine

## 2020-08-05 DIAGNOSIS — E1136 Type 2 diabetes mellitus with diabetic cataract: Secondary | ICD-10-CM

## 2020-08-05 NOTE — Telephone Encounter (Signed)
Requested  medications are  due for refill today yes  Requested medications are on the active medication list yes  Last refill 5/15  Last visit Aug 2020  Future visit scheduled no  Notes to clinic Failed protocol of visit within 6 months

## 2020-08-06 NOTE — Telephone Encounter (Signed)
Transmission failure was received today and faxed it again with successful transmission notice received.

## 2020-08-06 NOTE — Telephone Encounter (Signed)
Patients wife brought patient assistance form by for his Entresto. Faxed to company.

## 2020-08-09 ENCOUNTER — Ambulatory Visit (INDEPENDENT_AMBULATORY_CARE_PROVIDER_SITE_OTHER): Payer: Medicare HMO | Admitting: *Deleted

## 2020-08-09 DIAGNOSIS — I4901 Ventricular fibrillation: Secondary | ICD-10-CM | POA: Diagnosis not present

## 2020-08-09 LAB — CUP PACEART REMOTE DEVICE CHECK
Battery Remaining Longevity: 129 mo
Battery Voltage: 3.03 V
Brady Statistic RV Percent Paced: 0.05 %
Date Time Interrogation Session: 20210816022823
HighPow Impedance: 63 Ohm
Implantable Lead Implant Date: 20200514
Implantable Lead Location: 753860
Implantable Lead Model: 6935
Implantable Pulse Generator Implant Date: 20200514
Lead Channel Impedance Value: 342 Ohm
Lead Channel Impedance Value: 437 Ohm
Lead Channel Pacing Threshold Amplitude: 0.75 V
Lead Channel Pacing Threshold Pulse Width: 0.4 ms
Lead Channel Sensing Intrinsic Amplitude: 14.5 mV
Lead Channel Sensing Intrinsic Amplitude: 14.5 mV
Lead Channel Setting Pacing Amplitude: 2 V
Lead Channel Setting Pacing Pulse Width: 0.4 ms
Lead Channel Setting Sensing Sensitivity: 0.3 mV

## 2020-08-10 NOTE — Progress Notes (Signed)
Remote ICD transmission.   

## 2020-08-10 NOTE — Telephone Encounter (Signed)
Incoming fax from [employer].  Patient is approved through the calendar year.

## 2020-08-16 ENCOUNTER — Ambulatory Visit: Payer: Self-pay | Admitting: Family Medicine

## 2020-08-16 ENCOUNTER — Telehealth: Payer: Self-pay | Admitting: Family Medicine

## 2020-08-16 DIAGNOSIS — R972 Elevated prostate specific antigen [PSA]: Secondary | ICD-10-CM

## 2020-08-16 DIAGNOSIS — E1136 Type 2 diabetes mellitus with diabetic cataract: Secondary | ICD-10-CM

## 2020-08-16 DIAGNOSIS — I10 Essential (primary) hypertension: Secondary | ICD-10-CM

## 2020-08-16 DIAGNOSIS — Z0000 Encounter for general adult medical examination without abnormal findings: Secondary | ICD-10-CM

## 2020-08-16 NOTE — Telephone Encounter (Signed)
Signed orders.

## 2020-08-17 ENCOUNTER — Other Ambulatory Visit: Payer: Self-pay

## 2020-08-17 ENCOUNTER — Other Ambulatory Visit: Payer: Medicare HMO

## 2020-08-17 DIAGNOSIS — E1136 Type 2 diabetes mellitus with diabetic cataract: Secondary | ICD-10-CM | POA: Diagnosis not present

## 2020-08-17 DIAGNOSIS — Z0000 Encounter for general adult medical examination without abnormal findings: Secondary | ICD-10-CM | POA: Diagnosis not present

## 2020-08-17 DIAGNOSIS — R972 Elevated prostate specific antigen [PSA]: Secondary | ICD-10-CM | POA: Diagnosis not present

## 2020-08-17 DIAGNOSIS — I10 Essential (primary) hypertension: Secondary | ICD-10-CM | POA: Diagnosis not present

## 2020-08-18 DIAGNOSIS — H402213 Chronic angle-closure glaucoma, right eye, severe stage: Secondary | ICD-10-CM | POA: Diagnosis not present

## 2020-08-18 LAB — CBC WITH DIFFERENTIAL/PLATELET
Absolute Monocytes: 680 cells/uL (ref 200–950)
Basophils Absolute: 32 cells/uL (ref 0–200)
Basophils Relative: 0.5 %
Eosinophils Absolute: 88 cells/uL (ref 15–500)
Eosinophils Relative: 1.4 %
HCT: 42.7 % (ref 38.5–50.0)
Hemoglobin: 13.9 g/dL (ref 13.2–17.1)
Lymphs Abs: 1235 cells/uL (ref 850–3900)
MCH: 30.2 pg (ref 27.0–33.0)
MCHC: 32.6 g/dL (ref 32.0–36.0)
MCV: 92.6 fL (ref 80.0–100.0)
MPV: 10.8 fL (ref 7.5–12.5)
Monocytes Relative: 10.8 %
Neutro Abs: 4265 cells/uL (ref 1500–7800)
Neutrophils Relative %: 67.7 %
Platelets: 156 10*3/uL (ref 140–400)
RBC: 4.61 10*6/uL (ref 4.20–5.80)
RDW: 14.8 % (ref 11.0–15.0)
Total Lymphocyte: 19.6 %
WBC: 6.3 10*3/uL (ref 3.8–10.8)

## 2020-08-18 LAB — BASIC METABOLIC PANEL WITH GFR
BUN: 14 mg/dL (ref 7–25)
CO2: 29 mmol/L (ref 20–32)
Calcium: 9.4 mg/dL (ref 8.6–10.3)
Chloride: 105 mmol/L (ref 98–110)
Creat: 1.21 mg/dL (ref 0.70–1.25)
GFR, Est African American: 71 mL/min/{1.73_m2} (ref >=60)
GFR, Est Non African American: 62 mL/min/{1.73_m2} (ref >=60)
Glucose, Bld: 170 mg/dL — ABNORMAL HIGH (ref 65–99)
Potassium: 4.2 mmol/L (ref 3.5–5.3)
Sodium: 140 mmol/L (ref 135–146)

## 2020-08-18 LAB — LIPID PANEL
Cholesterol: 184 mg/dL (ref <200)
HDL: 72 mg/dL (ref >=40)
LDL Cholesterol (Calc): 94 mg/dL (calc)
Non-HDL Cholesterol (Calc): 112 mg/dL (calc) (ref <130)
Total CHOL/HDL Ratio: 2.6 (calc) (ref <5.0)
Triglycerides: 89 mg/dL (ref <150)

## 2020-08-18 LAB — HEMOGLOBIN A1C
Hgb A1c MFr Bld: 7.5 % of total Hgb — ABNORMAL HIGH (ref <5.7)
Mean Plasma Glucose: 169 (calc)
eAG (mmol/L): 9.3 (calc)

## 2020-08-18 LAB — HM DIABETES EYE EXAM

## 2020-08-18 LAB — PSA: PSA: 3.4 ng/mL (ref <=4.0)

## 2020-08-19 ENCOUNTER — Ambulatory Visit (INDEPENDENT_AMBULATORY_CARE_PROVIDER_SITE_OTHER): Payer: Medicare HMO | Admitting: Family Medicine

## 2020-08-19 ENCOUNTER — Other Ambulatory Visit: Payer: Self-pay

## 2020-08-19 ENCOUNTER — Encounter: Payer: Self-pay | Admitting: Family Medicine

## 2020-08-19 VITALS — BP 135/79 | HR 59 | Temp 96.9°F | Resp 16 | Ht 68.0 in | Wt 158.6 lb

## 2020-08-19 DIAGNOSIS — I5022 Chronic systolic (congestive) heart failure: Secondary | ICD-10-CM | POA: Diagnosis not present

## 2020-08-19 DIAGNOSIS — E1136 Type 2 diabetes mellitus with diabetic cataract: Secondary | ICD-10-CM | POA: Diagnosis not present

## 2020-08-19 DIAGNOSIS — I420 Dilated cardiomyopathy: Secondary | ICD-10-CM | POA: Diagnosis not present

## 2020-08-19 DIAGNOSIS — I10 Essential (primary) hypertension: Secondary | ICD-10-CM

## 2020-08-19 DIAGNOSIS — Z1211 Encounter for screening for malignant neoplasm of colon: Secondary | ICD-10-CM

## 2020-08-19 DIAGNOSIS — Z0000 Encounter for general adult medical examination without abnormal findings: Secondary | ICD-10-CM | POA: Diagnosis not present

## 2020-08-19 DIAGNOSIS — R972 Elevated prostate specific antigen [PSA]: Secondary | ICD-10-CM

## 2020-08-19 MED ORDER — METFORMIN HCL ER 500 MG PO TB24: ORAL_TABLET | ORAL | 3 refills | Status: DC

## 2020-08-19 NOTE — Assessment & Plan Note (Signed)
Well-controlled HTN - Home BP readings  reviewed Complication with CHF   Plan:  1. Continue current BP regimen 2. Encourage improved lifestyle - low sodium diet, regular exercise 3.Continue monitor BP outside office, bring readings to next visit, if persistently >140/90 or new symptoms notify office sooner 

## 2020-08-19 NOTE — Patient Instructions (Addendum)
Thank you for coming to the office today.  Ask your Cardiologist about looking at medicines to see if you can eventually change to take a Sildenafil or Cialis for erections, you are on Imdur now which would not be able to be taken together.  We can refer to Urologist if need  Please schedule and return for a NURSE ONLY VISIT for VACCINE - Approximately around 1 month or mid September 2021 - Need High Dose Flu Vaccine - Need 2nd and final dose - pneumonia vaccine = Pneumovax-23 - 2nd final dose One in each arm  Also bring copy of your COVID19 vaccine card so we can update our system.  Colon Cancer Screening: - For all adults age 38+ routine colon cancer screening is highly recommended.     - Recent guidelines from American Cancer Society recommend starting age of 58 - Early detection of colon cancer is important, because often there are no warning signs or symptoms, also if found early usually it can be cured. Late stage is hard to treat.  - If you are not interested in Colonoscopy screening (if done and normal you could be cleared for 5 to 10 years until next due), then Cologuard is an excellent alternative for screening test for Colon Cancer. It is highly sensitive for detecting DNA of colon cancer from even the earliest stages. Also, there is NO bowel prep required. - If Cologuard is NEGATIVE, then it is good for 3 years before next due - If Cologuard is POSITIVE, then it is strongly advised to get a Colonoscopy, which allows the GI doctor to locate the source of the cancer or polyp (even very early stage) and treat it by removing it. ------------------------- ORDERED COLOGUARD TODAY  Follow instructions to collect sample, you may call the company for any help or questions, 24/7 telephone support at [phone].  PSA 3.4 great number, check yearly  Recent Labs    08/17/20 0838  HGBA1C 7.5*     Please schedule a Follow-up Appointment to: Return in about 6 months (around  02/19/2021) for 6 month DM A1c.  If you have any other questions or concerns, please feel free to call the office or send a message through MyChart. You may also schedule an earlier appointment if necessary.  Additionally, you may be receiving a survey about your experience at our office within a few days to 1 week by e-mail or mail. We value your feedback.  Saralyn Pilar, DO [facility], [place]

## 2020-08-19 NOTE — Assessment & Plan Note (Signed)
Mostly controlled DM A1c 7.5, elevated today No evidence of hypoglycemia Complications - DM cataracts, glaucoma, other including atherosclerotic vascular disease - increases risk of future cardiovascular complications   Plan: 1. CONTINUE Metformin XR 500mg  daily - discussed that no change for now - He declines SGLT2 / GLP1 therapy 2. Encourage improved lifestyle - low carb, low sugar diet, reduce portion size, continue improving regular exercise - reviewed diet recommendation 3. Check CBG, bring log to next visit for review 4. Continue ACEi, ASA - future reconsider starting statin - declined today DM Foot DM EYe requested 08/18/20

## 2020-08-19 NOTE — Progress Notes (Signed)
Subjective:    Patient ID: Russell Joy., male    DOB: 10/13/1951, 69 y.o.   MRN: [id number]  Russell Denham. is a 69 y.o. male presenting on 08/19/2020 for Annual Exam   HPI   Here for Annual Physical and Lab Review.  CHRONIC DM, Type 2with cataracts/ Atherosclerotic vascular disease He is doing well, A1c 7.5 CBGs:Checks CBG every other day, no new readings lately, average previously 120-150 Meds: Metformin 500mg  XRdaily- previously reduced from BID Reports good compliance. Tolerating well w/o side-effects Currently on ACEi Lifestyle: Weight gain 1-2 lbs but overall steady -Diet (tries to follow DM diet, occasional fried food) -Exercise (Walking few times a week up to 2 miles) - He is now followed by [facility] Dr [facility] see below, for glaucoma and cataracts Denies hypoglycemia, polyuria, visual changes, numbness or tingling.  HYPERLIPIDEMIA: - Reports no concerns. Last lipid panel 07/2020, controlled  Not on statin  CHRONIC HTN / Systolic CHF / Cardiomyopathy Reports last visit Dr 08/2020 Cardiology 06/2019- taken off of Amlodipine and on Entresto Current Meds - Carvedilol 25mg  BID, Entrestro 97-103 BID   Reports good compliance, took meds today. Tolerating well, w/o complaints. Lifestyle: Denies CP, dyspnea, HA, edema, dizziness / lightheadedness  BPH No prior dx of BPH. He has history of normal DRE by prior Urology Dr 07/2019 [facility])  - PSA trend 3.7 to 4.7 in past 4 years - Today PSA 3.4 improved - He tried saw palmetto but did not continue Not on flomax AUA BPH score 16 in past  Erectile Dysfunction Reports worse ED on medications. He is on Imdur, has NTG SL PRN He is not on any ED medication  Health Maintenance:  Due for 2nd pneumonia vaccine >1 year after previous vaccine, now to receive Pneumovax-23 soon, he will return in few weeks for Flu Shot HIgh Dose >age 88 and this vaccine at same time. Since it is 2 days early  Return with copy of  COVID19 vaccine card for updating  Prostate CA Screening: Last PSA 3.4 (07/2020) prior 4. Currently BPH LUTS. No known family history of prostate CA.   Colon CA Screening: Last Colonoscopy 09/24/2013 (done by Dr 09-02-1999), results with no polyps, good for 10 years. Currently asymptomatic. known family history of colon CA. We offered cologuard now at year 7, he will proceed with this.    Depression screen [facility] 2/9 08/19/2020 08/22/2019 05/22/2019  Decreased Interest 0 0 0  Down, Depressed, Hopeless 0 0 0  PHQ - 2 Score 0 0 0    Past Medical History:  Diagnosis Date  . CAD (coronary artery disease)    05/07/2019 LHC showed nonobstructive CAD  . Diabetes (HCC)   . Frequent PVCs   . Heart failure with reduced ejection fraction (HCC)    04/2019 EF 20-25%  . Hypertension   . Impotence of organic origin   . NICM (nonischemic cardiomyopathy) (HCC)    HFrEF in setting of NICM  . Pain in joint, shoulder region   . Personal history of colonic polyps   . Ventricular tachycardia (HCC)    04/2019 in setting of cardiac arrest   Past Surgical History:  Procedure Laterality Date  . CARDIAC CATHETERIZATION  11/17/2013  . COLONOSCOPY  2009  . ICD IMPLANT N/A 05/08/2019   Procedure: ICD IMPLANT;  Surgeon: 2010, MD;  Location: [facility] INVASIVE CV LAB;  Service: Cardiovascular;  Laterality: N/A;  . RIGHT/LEFT HEART CATH AND CORONARY ANGIOGRAPHY N/A 05/07/2019   Procedure: RIGHT/LEFT  HEART CATH AND CORONARY ANGIOGRAPHY;  Surgeon: Yvonne Kendall, MD;  Location: [facility] INVASIVE CV LAB;  Service: Cardiovascular;  Laterality: N/A;   Social History   Socioeconomic History  . Marital status: Married    Spouse name: Not on file  . Number of children: Not on file  . Years of education: Not on file  . Highest education level: Not on file  Occupational History  . Occupation: Lexicographer  Tobacco Use  . Smoking status: Never Smoker  . Smokeless tobacco: Never Used  Vaping Use  . Vaping Use: Never  used  Substance and Sexual Activity  . Alcohol use: Yes    Comment: occassional  . Drug use: No  . Sexual activity: Not on file  Other Topics Concern  . Not on file  Social History Narrative  . Not on file   Social Determinants of Health   Financial Resource Strain:   . Difficulty of Paying Living Expenses: Not on file  Food Insecurity:   . Worried About Programme researcher, broadcasting/film/video in the Last Year: Not on file  . Ran Out of Food in the Last Year: Not on file  Transportation Needs:   . Lack of Transportation (Medical): Not on file  . Lack of Transportation (Non-Medical): Not on file  Physical Activity:   . Days of Exercise per Week: Not on file  . Minutes of Exercise per Session: Not on file  Stress:   . Feeling of Stress : Not on file  Social Connections:   . Frequency of Communication with Friends and Family: Not on file  . Frequency of Social Gatherings with Friends and Family: Not on file  . Attends Religious Services: Not on file  . Active Member of Clubs or Organizations: Not on file  . Attends Banker Meetings: Not on file  . Marital Status: Not on file  Intimate Partner Violence:   . Fear of Current or Ex-Partner: Not on file  . Emotionally Abused: Not on file  . Physically Abused: Not on file  . Sexually Abused: Not on file   Family History  Problem Relation Age of Onset  . Colon cancer Father        1970's  . Hypertension Mother   . Diabetes Mother    Current Outpatient Medications on File Prior to Visit  Medication Sig  . amiodarone (PACERONE) 200 MG tablet Take 1 tablet (200 mg total) by mouth daily.  . brimonidine (ALPHAGAN) 0.2 % ophthalmic solution Place 1 drop into both eyes 2 (two) times daily.  . carvedilol (COREG) 25 MG tablet Take 1 tablet (25 mg total) by mouth 2 (two) times daily.  . Cholecalciferol (VITAMIN D3) 2000 units TABS Take 1 capsule by mouth daily.   Marland Kitchen glucose blood test strip Use as instructed to check blood sugar up to x 2  daily  . isosorbide mononitrate (IMDUR) 60 MG 24 hr tablet Take 1.5 tablets (90 mg total) by mouth 2 (two) times daily.  . Lancets (ONETOUCH ULTRASOFT) lancets Use as instructed to check blood sugar up to x 2 daily  . latanoprost (XALATAN) 0.005 % ophthalmic solution Place 1 drop into both eyes at bedtime.   . nitroGLYCERIN (NITROSTAT) 0.4 MG SL tablet Place 1 tablet (0.4 mg total) under the tongue every 5 (five) minutes x 3 doses as needed for chest pain.  . polyvinyl alcohol (LIQUIFILM TEARS) 1.4 % ophthalmic solution Place 2 drops into both eyes every 8 (eight) hours.  Marland Kitchen  sacubitril-valsartan (ENTRESTO) 97-103 MG Take 1 tablet by mouth 2 (two) times daily.  Marland Kitchen spironolactone (ALDACTONE) 25 MG tablet Take 1 tablet (25 mg total) by mouth daily.  . timolol (TIMOPTIC) 0.5 % ophthalmic solution Place 1 drop into both eyes 2 (two) times daily.   . dorzolamide-timolol (COSOPT) 22.3-6.8 MG/ML ophthalmic solution    No current facility-administered medications on file prior to visit.    Review of Systems  Constitutional: Negative for activity change, appetite change, chills, diaphoresis, fatigue and fever.  HENT: Negative for congestion and hearing loss.   Eyes: Negative for visual disturbance.  Respiratory: Negative for apnea, cough, chest tightness, shortness of breath and wheezing.   Cardiovascular: Negative for chest pain, palpitations and leg swelling.  Gastrointestinal: Negative for abdominal pain, anal bleeding, blood in stool, constipation, diarrhea, nausea and vomiting.  Endocrine: Negative for cold intolerance.  Genitourinary: Negative for difficulty urinating, dysuria, frequency and hematuria.  Musculoskeletal: Negative for arthralgias, back pain and neck pain.  Skin: Negative for rash.  Allergic/Immunologic: Negative for environmental allergies.  Neurological: Negative for dizziness, weakness, light-headedness, numbness and headaches.  Hematological: Negative for adenopathy.    Psychiatric/Behavioral: Negative for behavioral problems, dysphoric mood and sleep disturbance. The patient is not nervous/anxious.    Per HPI unless specifically indicated above      Objective:    BP 135/79   Pulse (!) 59   Temp (!) 96.9 F (36.1 C) (Temporal)   Resp 16   Ht 5\' 8"  (1.727 m)   Wt 158 lb 9.6 oz (71.9 kg)   SpO2 100%   BMI 24.12 kg/m   Wt Readings from Last 3 Encounters:  08/19/20 158 lb 9.6 oz (71.9 kg)  07/05/20 157 lb (71.2 kg)  02/20/20 156 lb (70.8 kg)    Physical Exam Vitals and nursing note reviewed.  Constitutional:      General: He is not in acute distress.    Appearance: He is well-developed. He is not diaphoretic.     Comments: Well-appearing, comfortable, cooperative  HENT:     Head: Normocephalic and atraumatic.  Eyes:     General:        Right eye: No discharge.        Left eye: No discharge.     Conjunctiva/sclera: Conjunctivae normal.     Pupils: Pupils are equal, round, and reactive to light.  Neck:     Thyroid: No thyromegaly.     Vascular: No carotid bruit.  Cardiovascular:     Rate and Rhythm: Normal rate and regular rhythm.     Heart sounds: Normal heart sounds. No murmur heard.   Pulmonary:     Effort: Pulmonary effort is normal. No respiratory distress.     Breath sounds: Normal breath sounds. No wheezing or rales.  Abdominal:     General: Bowel sounds are normal. There is no distension.     Palpations: Abdomen is soft. There is no mass.     Tenderness: There is no abdominal tenderness.  Musculoskeletal:        General: No tenderness. Normal range of motion.     Cervical back: Normal range of motion and neck supple.     Right lower leg: No edema.     Left lower leg: No edema.     Comments: Upper / Lower Extremities: - Normal muscle tone, strength bilateral upper extremities 5/5, lower extremities 5/5  Lymphadenopathy:     Cervical: No cervical adenopathy.  Skin:    General: Skin is warm  and dry.     Findings: No  erythema or rash.  Neurological:     Mental Status: He is alert and oriented to person, place, and time.     Comments: Distal sensation intact to light touch all extremities  Psychiatric:        Behavior: Behavior normal.     Comments: Well groomed, good eye contact, normal speech and thoughts      Diabetic Foot Exam - Simple   Simple Foot Form Diabetic Foot exam was performed with the following findings: Yes 08/19/2020  9:15 AM  Visual Inspection See comments: Yes Sensation Testing Intact to touch and monofilament testing bilaterally: Yes Pulse Check Posterior Tibialis and Dorsalis pulse intact bilaterally: Yes Comments Mild dry skin and callus formation. No ulceration. Intact monofilament.    Recent Labs    08/17/20 0838  HGBA1C 7.5*     Results for orders placed or performed in visit on 08/16/20  Hemoglobin A1c  Result Value Ref Range   Hgb A1c MFr Bld 7.5 (H) <5.7 % of total Hgb   Mean Plasma Glucose 169 (calc)   eAG (mmol/L) 9.3 (calc)  Lipid panel  Result Value Ref Range   Cholesterol 184 <200 mg/dL   HDL 72 > OR = 40 mg/dL   Triglycerides 89 <161 mg/dL   LDL Cholesterol (Calc) 94 mg/dL (calc)   Total CHOL/HDL Ratio 2.6 <5.0 (calc)   Non-HDL Cholesterol (Calc) 112 <130 mg/dL (calc)  CBC with Differential/Platelet  Result Value Ref Range   WBC 6.3 3.8 - 10.8 Thousand/uL   RBC 4.61 4.20 - 5.80 Million/uL   Hemoglobin 13.9 13.2 - 17.1 g/dL   HCT 09.6 38 - 50 %   MCV 92.6 80.0 - 100.0 fL   MCH 30.2 27.0 - 33.0 pg   MCHC 32.6 32.0 - 36.0 g/dL   RDW 04.5 40.9 - 81.1 %   Platelets 156 140 - 400 Thousand/uL   MPV 10.8 7.5 - 12.5 fL   Neutro Abs 4,265 1,500 - 7,800 cells/uL   Lymphs Abs 1,235 850 - 3,900 cells/uL   Absolute Monocytes 680 200 - 950 cells/uL   Eosinophils Absolute 88 15 - 500 cells/uL   Basophils Absolute 32 0 - 200 cells/uL   Neutrophils Relative % 67.7 %   Total Lymphocyte 19.6 %   Monocytes Relative 10.8 %   Eosinophils Relative 1.4 %    Basophils Relative 0.5 %  BASIC METABOLIC PANEL WITH GFR  Result Value Ref Range   Glucose, Bld 170 (H) 65 - 99 mg/dL   BUN 14 7 - 25 mg/dL   Creat 9.14 7.82 - 9.56 mg/dL   GFR, Est Non African American 62 > OR = 60 mL/min/1.50m2   GFR, Est African American 71 > OR = 60 mL/min/1.60m2   BUN/Creatinine Ratio NOT APPLICABLE 6 - 22 (calc)   Sodium 140 135 - 146 mmol/L   Potassium 4.2 3.5 - 5.3 mmol/L   Chloride 105 98 - 110 mmol/L   CO2 29 20 - 32 mmol/L   Calcium 9.4 8.6 - 10.3 mg/dL  PSA  Result Value Ref Range   PSA 3.4 < OR = 4.0 ng/mL      Assessment & Plan:   Problem List Items Addressed This Visit    Essential hypertension    Well-controlled HTN - Home BP readings  reviewed Complication with CHF   Plan:  1. Continue current BP regimen 2. 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 CONTINUE Metformin XR 500mg  daily - discussed that no change for now - He declines SGLT2 / GLP1 therapy 2. Encourage improved lifestyle - low carb, low sugar diet, reduce portion size, continue improving regular exercise - reviewed diet recommendation 3. Check CBG, bring log to next visit for review 4. Continue ACEi, ASA - future reconsider starting statin - declined today DM Foot DM EYe requested 08/18/20      Relevant Medications   metFORMIN (GLUCOPHAGE-XR) 500 MG 24 hr tablet   Congestive dilated  cardiomyopathy (HCC)    Clinically stable, euvolemic, without recent concern Followed by Cardiology Dr 08/20/20 Has inc risk ASCVD, discussed primary prevention - continue ASA 81  Again declined Statin today, see A&P      Chronic systolic heart failure (HCC)    Followed by Cardiology Euvolemic, no flare up On med management       Other Visit Diagnoses    Annual physical exam    -  Primary   Screen for colon cancer       Relevant Orders   Cologuard      Updated Health Maintenance information - Due return for Pneumvoax 23 in 1 month and High Dose Flu vaccine at same time - Need updated covid vaccine card here, bring next time - UTD Colonoscopy 2014 no polyp 10 year but he opts to do cologuard now at year 7, ordered Reviewed recent lab results with patient Encouraged improvement to lifestyle with diet and exercise - Goal of weight loss   Meds ordered this encounter  Medications  . metFORMIN (GLUCOPHAGE-XR) 500 MG 24 hr tablet    Sig: TAKE 1 TABLET BY MOUTH EVERY DAY WITH BREAKFAST    Dispense:  90 tablet    Refill:  3    Add refills    Follow up plan: Return in about 6 months (around 02/19/2021) for 6 month DM A1c.  02/21/2021, DO [facility] 08/19/2020, 9:06 AM

## 2020-08-19 NOTE — Assessment & Plan Note (Addendum)
Clinically stable, euvolemic, without recent concern Followed by Cardiology Dr Mariah Milling Has inc risk ASCVD, discussed primary prevention - continue ASA 81  Again declined Statin today, see A&P

## 2020-08-19 NOTE — Assessment & Plan Note (Signed)
Stable baseline to improved 3-4 range PSA Lab 3.4 now Prior Dr Evelene Croon [facility]  Monitor PSA in future May return to Uro if need

## 2020-08-19 NOTE — Assessment & Plan Note (Addendum)
Followed by Cardiology Euvolemic, no flare up On med management 

## 2020-08-23 ENCOUNTER — Ambulatory Visit (INDEPENDENT_AMBULATORY_CARE_PROVIDER_SITE_OTHER): Payer: Medicare HMO

## 2020-08-23 DIAGNOSIS — I5022 Chronic systolic (congestive) heart failure: Secondary | ICD-10-CM

## 2020-08-23 DIAGNOSIS — Z95810 Presence of automatic (implantable) cardiac defibrillator: Secondary | ICD-10-CM | POA: Diagnosis not present

## 2020-08-24 ENCOUNTER — Telehealth: Payer: Self-pay

## 2020-08-24 NOTE — Telephone Encounter (Signed)
Remote ICM transmission received.  Attempted call to patient regarding ICM remote transmission and unable to leave a message due to voice mail is not set up.

## 2020-08-24 NOTE — Progress Notes (Signed)
EPIC Encounter for ICM Monitoring  Patient Name: Russell [name]. is a 69 y.o. male Date: 08/24/2020 Primary Care Physican: Smitty Cords, DO Primary Cardiologist:Gollan Electrophysiologist:Allred 7/30/2021OfficeWeight:158 - 160lbs  SAttempted call to patient and unable to reach.  Transmission reviewed.   Optivol thoracic impedancenormal.  Labs: 07/05/2020 Creatinine 1.23, BUN 19, Potassium 4.4, Sodium 140, GFR 61-70 A complete set of results can be found in Results Review.  Recommendations:Unable to reach.    Follow-up plan: ICM clinic phone appointment on10/03/2020. 91 day device clinic remote transmission 11/08/2020.   EP/Cardiology Next Office Visit:  11/10/2020 with Dr Mariah Milling   Copy of ICM check sent to Dr.Allred.    3 month ICM trend: 08/24/2020    1 Year ICM trend:       [name], RN 08/24/2020 4:45 PM

## 2020-09-01 DIAGNOSIS — Z1211 Encounter for screening for malignant neoplasm of colon: Secondary | ICD-10-CM | POA: Diagnosis not present

## 2020-09-03 LAB — COLOGUARD: Cologuard: NEGATIVE

## 2020-09-08 DIAGNOSIS — N5201 Erectile dysfunction due to arterial insufficiency: Secondary | ICD-10-CM | POA: Diagnosis not present

## 2020-09-08 DIAGNOSIS — R972 Elevated prostate specific antigen [PSA]: Secondary | ICD-10-CM | POA: Diagnosis not present

## 2020-09-08 DIAGNOSIS — Z125 Encounter for screening for malignant neoplasm of prostate: Secondary | ICD-10-CM | POA: Diagnosis not present

## 2020-09-08 DIAGNOSIS — N401 Enlarged prostate with lower urinary tract symptoms: Secondary | ICD-10-CM | POA: Diagnosis not present

## 2020-09-09 DIAGNOSIS — Z125 Encounter for screening for malignant neoplasm of prostate: Secondary | ICD-10-CM | POA: Diagnosis not present

## 2020-09-09 DIAGNOSIS — N401 Enlarged prostate with lower urinary tract symptoms: Secondary | ICD-10-CM | POA: Diagnosis not present

## 2020-09-09 DIAGNOSIS — R972 Elevated prostate specific antigen [PSA]: Secondary | ICD-10-CM | POA: Diagnosis not present

## 2020-09-09 DIAGNOSIS — N5201 Erectile dysfunction due to arterial insufficiency: Secondary | ICD-10-CM | POA: Diagnosis not present

## 2020-09-09 DIAGNOSIS — E291 Testicular hypofunction: Secondary | ICD-10-CM | POA: Diagnosis not present

## 2020-09-09 LAB — COLOGUARD: COLOGUARD: NEGATIVE

## 2020-09-21 ENCOUNTER — Encounter: Payer: Self-pay | Admitting: Family Medicine

## 2020-09-21 ENCOUNTER — Telehealth: Payer: Self-pay

## 2020-09-21 NOTE — Telephone Encounter (Signed)
Copied from CRM [id number]. Topic: General - Other >> Sep 21, 2020  9:56 AM Jaquita Rector A wrote: Reason for CRM: Patient called to say that he received a letter that the Dr has his Cologuard result and he would like a call to discuss them please.  Ph# [phone]

## 2020-09-21 NOTE — Telephone Encounter (Signed)
Spoke to the patient Cologuard had send him a letter stating call the PCP for the test result --advised him for negative test result for colorectal cancer and colon polyp and don't need next one till three years and now abstracted in the chart.

## 2020-09-27 ENCOUNTER — Ambulatory Visit (INDEPENDENT_AMBULATORY_CARE_PROVIDER_SITE_OTHER): Payer: Medicare HMO

## 2020-09-27 DIAGNOSIS — Z95810 Presence of automatic (implantable) cardiac defibrillator: Secondary | ICD-10-CM

## 2020-09-27 DIAGNOSIS — I5022 Chronic systolic (congestive) heart failure: Secondary | ICD-10-CM

## 2020-09-29 NOTE — Progress Notes (Signed)
EPIC Encounter for ICM Monitoring  Patient Name: Russell [name]. is a 69 y.o. male Date: 09/29/2020 Primary Care Physican: Smitty Cords, DO Primary Cardiologist:Gollan Electrophysiologist:Allred 8/26/2021OfficeWeight:158 - 160lbs  Transmission reviewed.   Optivol thoracic impedancenormal.  Labs: 07/05/2020 Creatinine 1.23, BUN 19, Potassium 4.4, Sodium 140, GFR 61-70 A complete set of results can be found in Results Review.  Recommendations:No changes  Follow-up plan: ICM clinic phone appointment on11/16/2021. 91 day device clinic remote transmission 11/08/2020.   EP/Cardiology Next Office Visit:  01/10/2021 with Dr Mariah Milling   Copy of ICM check sent to Dr.Allred.    3 month ICM trend: 09/27/2020    1 Year ICM trend:       [name], RN 09/29/2020 2:44 PM

## 2020-10-11 ENCOUNTER — Other Ambulatory Visit: Payer: Self-pay | Admitting: Family Medicine

## 2020-10-11 DIAGNOSIS — E1136 Type 2 diabetes mellitus with diabetic cataract: Secondary | ICD-10-CM

## 2020-10-20 ENCOUNTER — Ambulatory Visit (INDEPENDENT_AMBULATORY_CARE_PROVIDER_SITE_OTHER): Payer: Medicare HMO

## 2020-10-20 ENCOUNTER — Other Ambulatory Visit: Payer: Self-pay

## 2020-10-20 DIAGNOSIS — Z23 Encounter for immunization: Secondary | ICD-10-CM

## 2020-11-08 ENCOUNTER — Other Ambulatory Visit: Payer: Self-pay | Admitting: Cardiovascular Disease

## 2020-11-08 ENCOUNTER — Ambulatory Visit (INDEPENDENT_AMBULATORY_CARE_PROVIDER_SITE_OTHER): Payer: Medicare HMO

## 2020-11-08 DIAGNOSIS — I428 Other cardiomyopathies: Secondary | ICD-10-CM

## 2020-11-08 LAB — CUP PACEART REMOTE DEVICE CHECK
Battery Remaining Longevity: 127 mo
Battery Voltage: 3.03 V
Brady Statistic RV Percent Paced: 0.08 %
Date Time Interrogation Session: 20211115022822
HighPow Impedance: 50 Ohm
Implantable Lead Implant Date: 20200514
Implantable Lead Location: 753860
Implantable Lead Model: 6935
Implantable Pulse Generator Implant Date: 20200514
Lead Channel Impedance Value: 342 Ohm
Lead Channel Impedance Value: 399 Ohm
Lead Channel Pacing Threshold Amplitude: 0.75 V
Lead Channel Pacing Threshold Pulse Width: 0.4 ms
Lead Channel Sensing Intrinsic Amplitude: 13 mV
Lead Channel Sensing Intrinsic Amplitude: 13 mV
Lead Channel Setting Pacing Amplitude: 2 V
Lead Channel Setting Pacing Pulse Width: 0.4 ms
Lead Channel Setting Sensing Sensitivity: 0.3 mV

## 2020-11-09 ENCOUNTER — Ambulatory Visit (INDEPENDENT_AMBULATORY_CARE_PROVIDER_SITE_OTHER): Payer: Medicare HMO

## 2020-11-09 DIAGNOSIS — I5022 Chronic systolic (congestive) heart failure: Secondary | ICD-10-CM | POA: Diagnosis not present

## 2020-11-09 DIAGNOSIS — Z95810 Presence of automatic (implantable) cardiac defibrillator: Secondary | ICD-10-CM

## 2020-11-09 NOTE — Progress Notes (Signed)
Remote ICD transmission.   

## 2020-11-10 ENCOUNTER — Telehealth: Payer: Self-pay

## 2020-11-10 NOTE — Telephone Encounter (Signed)
Remote ICM transmission received.  Attempted call to patient regarding ICM remote transmission and left detailed message per DPR.  Advised to return call for any fluid symptoms or questions. Next ICM remote transmission scheduled 12/13/2020.   ° ° °

## 2020-11-10 NOTE — Progress Notes (Signed)
EPIC Encounter for ICM Monitoring  Patient Name: Christopherjohn Schiele. is a 69 y.o. male Date: 11/10/2020 Primary Care Physican: Smitty Cords, DO Primary Cardiologist:Gollan Electrophysiologist:Allred 8/26/2021OfficeWeight:158 - 160lbs  Attempted call to patient and unable to reach.  Left detailed message per DPR regarding transmission. Transmission reviewed.   Optivol thoracic impedancenormal.  Labs: 07/05/2020 Creatinine 1.23, BUN 19, Potassium 4.4, Sodium 140, GFR 61-70 A complete set of results can be found in Results Review.  Recommendations:Left voice mail with ICM number and encouraged to call if experiencing any fluid symptoms.  Follow-up plan: ICM clinic phone appointment on12/20/2021. 91 day device clinic remote transmission2/14/2022.  EP/Cardiology Next Office Visit: 01/10/2021 with Dr Mariah Milling   Copy of ICM check sent to Dr.Allred.   3 month ICM trend: 11/09/2020    1 Year ICM trend:       [name], RN 11/10/2020 1:15 PM

## 2020-12-13 ENCOUNTER — Ambulatory Visit (INDEPENDENT_AMBULATORY_CARE_PROVIDER_SITE_OTHER): Payer: Medicare HMO

## 2020-12-13 DIAGNOSIS — Z95810 Presence of automatic (implantable) cardiac defibrillator: Secondary | ICD-10-CM

## 2020-12-13 DIAGNOSIS — I5022 Chronic systolic (congestive) heart failure: Secondary | ICD-10-CM

## 2020-12-13 NOTE — Progress Notes (Signed)
EPIC Encounter for ICM Monitoring  Patient Name: Russell [name]. is a 69 y.o. male Date: 12/13/2020 Primary Care Physican: Smitty Cords, DO Primary Cardiologist:Gollan Electrophysiologist:Allred 8/26/2021OfficeWeight:158 - 160lbs  Transmission reviewed.   Optivol thoracic impedancenormal.  Labs: 07/05/2020 Creatinine 1.23, BUN 19, Potassium 4.4, Sodium 140, GFR 61-70 A complete set of results can be found in Results Review.  Recommendations:No changes.  Follow-up plan: ICM clinic phone appointment on 01/25/2021. 91 day device clinic remote transmission2/14/2022.  EP/Cardiology Next Office Visit: 1/17/2022with Dr Mariah Milling   Copy of ICM check sent to Dr.Allred.   3 month ICM trend: 12/13/2020    1 Year ICM trend:       [name], RN 12/13/2020 12:48 PM

## 2021-01-10 ENCOUNTER — Other Ambulatory Visit: Payer: Self-pay | Admitting: Cardiovascular Disease

## 2021-01-10 ENCOUNTER — Ambulatory Visit: Payer: Medicare HMO | Admitting: Cardiovascular Disease

## 2021-01-11 NOTE — Progress Notes (Unsigned)
Cardiology Office Note  Date:  01/12/2021   ID:  Russell Joy., DOB 1951/08/15, MRN [id number]  PCP:  Smitty Cords, DO   Chief Complaint  Patient presents with  . Follow-up    6 month F/U    HPI:  Russell [name] is a 70 year old gentleman with a history of  diabetes,  Hypertension, who initially presented for abnormal EKG. he had a colonoscopy. During the procedure, noted to have abnormal telemetry. EKG was performed that showed anterolateral wall abnormality.  echocardiogram showed depressed ejection fraction 25-30%.   2014 pharmacologic Myoview suggested mid to distal anterior wall perfusion defect concerning for ischemia. He was started on beta blocker and ACE inhibitor He underwent cardiac catheterization given the perfusion defect and severely depressed ejection fraction.   this showed no significant CAD, ejection fraction had improved up to 40%. He presents today for routine follow-up of his nonischemic cardiomyopathy and hypertension, has ICD  On today's visit reports that he feels well Wants to start working out at the gym  Pacer/ICD download reviewed and discussed on today's visit Normal device function.   1 NSVT lasting 7 beats w/ rates 180's bpm  optivol is low/good, discussed in detail  Only taking imdur 60 daily.  Prior prescription was 90 twice daily Not taking spironolactone secondary to side effects Tolerating Coreg and Entresto Tolerating isosorbide 60 twice daily  Prior echo discussed Ejection fraction 20 to 25%,  Follow-up echo up to 30 to 35% September 2020 Declining repeat study  Followed by EP in [facility]  EKG personally reviewed by myself on todays visit Shows  normal sinus rhythm rate 63 bpm anterolateral T wave abnormality  unchanges  Other past medical history reviewed On 05/03/2019,  playing baseball,  developed chest pain, collapsed,  unresponsive. Bystander started CPR   in ventricular fibrillation and required 5 shocks while in the  field with subsequent ROSC. He was intubated  Total downtime estimated at approximately 5 minutes. Cooling protocol.  Troponin peaked at 0.69.  sustained VT on 05/06/2019 with successful defibrillation in the hospital  started on Amiodarone infusion  05/07/2019,  right and left heart catheterization with mild luminal irregularities   severely reduced LVEF (20-25%) with global hypokinesis  ICD was placed  PMH:   has a past medical history of CAD (coronary artery disease), Diabetes (HCC), Frequent PVCs, Heart failure with reduced ejection fraction (HCC), Hypertension, Impotence of organic origin, NICM (nonischemic cardiomyopathy) (HCC), Pain in joint, shoulder region, Personal history of colonic polyps, and Ventricular tachycardia (HCC).  PSH:    Past Surgical History:  Procedure Laterality Date  . CARDIAC CATHETERIZATION  11/17/2013  . COLONOSCOPY  2009  . ICD IMPLANT N/A 05/08/2019   Procedure: ICD IMPLANT;  Surgeon: Hillis Range, MD;  Location: [facility];  Service: Cardiovascular;  Laterality: N/A;  . RIGHT/LEFT HEART CATH AND CORONARY ANGIOGRAPHY N/A 05/07/2019   Procedure: RIGHT/LEFT HEART CATH AND CORONARY ANGIOGRAPHY;  Surgeon: Yvonne Kendall, MD;  Location: [facility] INVASIVE CV LAB;  Service: Cardiovascular;  Laterality: N/A;    Current Outpatient Medications  Medication Sig Dispense Refill  . amiodarone (PACERONE) 200 MG tablet TAKE 1 TABLET EVERY DAY 30 tablet 0  . brimonidine (ALPHAGAN) 0.2 % ophthalmic solution Place 1 drop into both eyes 2 (two) times daily.    . carvedilol (COREG) 25 MG tablet TAKE 1 TABLET TWICE DAILY 180 tablet 0  . Cholecalciferol (VITAMIN D3) 2000 units TABS Take 1 capsule by mouth daily.     Marland Kitchen glucose  blood test strip Use as instructed to check blood sugar up to x 2 daily 100 each 12  . isosorbide mononitrate (IMDUR) 60 MG 24 hr tablet Take 1.5 tablets (90 mg total) by mouth 2 (two) times daily. 270 tablet 3  . Lancets (ONETOUCH ULTRASOFT)  lancets Use as instructed to check blood sugar up to x 2 daily 100 each 12  . latanoprost (XALATAN) 0.005 % ophthalmic solution Place 1 drop into both eyes at bedtime.     . metFORMIN (GLUCOPHAGE-XR) 500 MG 24 hr tablet TAKE 1 TABLET BY MOUTH EVERY DAY WITH BREAKFAST 90 tablet 3  . nitroGLYCERIN (NITROSTAT) 0.4 MG SL tablet Place 1 tablet (0.4 mg total) under the tongue every 5 (five) minutes x 3 doses as needed for chest pain. 30 tablet 2  . polyvinyl alcohol (LIQUIFILM TEARS) 1.4 % ophthalmic solution Place 2 drops into both eyes every 8 (eight) hours. 15 mL 0  . sacubitril-valsartan (ENTRESTO) 97-103 MG Take 1 tablet by mouth 2 (two) times daily. 180 tablet 3  . spironolactone (ALDACTONE) 25 MG tablet Take 1 tablet (25 mg total) by mouth daily. 90 tablet 3  . tamsulosin (FLOMAX) 0.4 MG CAPS capsule Take 0.4 mg by mouth daily.    . timolol (TIMOPTIC) 0.5 % ophthalmic solution Place 1 drop into both eyes 2 (two) times daily.      No current facility-administered medications for this visit.    Allergies:   Patient has no known allergies.   Social History:  The patient  reports that he has never smoked. He has never used smokeless tobacco. He reports current alcohol use. He reports that he does not use drugs.   Family History:   family history includes Colon cancer in his father; Diabetes in his mother; Hypertension in his mother.    Review of Systems: Review of Systems  Constitutional: Negative.   HENT: Negative.   Respiratory: Negative.   Cardiovascular: Negative.   Gastrointestinal: Negative.   Musculoskeletal: Negative.   Neurological: Negative.   Psychiatric/Behavioral: Negative.   All other systems reviewed and are negative.   PHYSICAL EXAM: VS:  BP 130/80 (BP Location: Left Arm, Patient Position: Sitting, Cuff Size: Normal)   Pulse 63   Ht 5\' 8"  (1.727 m)   Wt 159 lb (72.1 kg)   SpO2 95%   BMI 24.18 kg/m  , BMI Body mass index is 24.18 kg/m. Constitutional:  oriented  to person, place, and time. No distress.  HENT:  Head: Grossly normal Eyes:  no discharge. No scleral icterus.  Neck: No JVD, no carotid bruits  Cardiovascular: Regular rate and rhythm, no murmurs appreciated Pulmonary/Chest: Clear to auscultation bilaterally, no wheezes or rails Abdominal: Soft.  no distension.  no tenderness.  Musculoskeletal: Normal range of motion Neurological:  normal muscle tone. Coordination normal. No atrophy Skin: Skin warm and dry Psychiatric: normal affect, pleasant  Recent Labs: 07/05/2020: ALT 19; TSH 0.848 08/17/2020: BUN 14; Creat 1.21; Hemoglobin 13.9; Platelets 156; Potassium 4.2; Sodium 140    Lipid Panel Lab Results  Component Value Date   CHOL 184 08/17/2020   HDL 72 08/17/2020   LDLCALC 94 08/17/2020   TRIG 89 08/17/2020    Wt Readings from Last 3 Encounters:  01/12/21 159 lb (72.1 kg)  08/19/20 158 lb 9.6 oz (71.9 kg)  07/05/20 157 lb (71.2 kg)     ASSESSMENT AND PLAN:  Congestive dilated cardiomyopathy (HCC)  Continue Coreg and Entresto, isosorbide 60 daily We previously increased the isosorbide dosing,  he has only continued 60 daily Labs 06/2020 amiodarone surveillance  Nonischemic cardiomyopathy Nonobstructive coronary disease by prior catheterization Unable to exclude hypertensive heart disease Declining any medication changes at this time, was on higher dose isosorbide but cut this back to 60 daily on his own Could not tolerate spironolactone Ejection fraction 30 to 35%  Controlled type 2 diabetes mellitus without complication, unspecified long term insulin use status (HCC) --  A1c 7.5 Recommended low carbohydrate diet, workout at the gym  VF Defibrillator in place, no recent events Download reviewed with him, low OptiVol   Total encounter time more than 25 minutes Greater than 50% was spent in counseling and coordination of care with the patient     Orders Placed This Encounter  Procedures  . EKG 12-Lead      Signed, Dossie Arbour, M.D., Ph.D. 01/12/2021  [facility] [place], [place] [phone]

## 2021-01-12 ENCOUNTER — Encounter: Payer: Self-pay | Admitting: Cardiovascular Disease

## 2021-01-12 ENCOUNTER — Other Ambulatory Visit: Payer: Self-pay

## 2021-01-12 ENCOUNTER — Ambulatory Visit (INDEPENDENT_AMBULATORY_CARE_PROVIDER_SITE_OTHER): Payer: Medicare HMO | Admitting: Cardiovascular Disease

## 2021-01-12 DIAGNOSIS — I428 Other cardiomyopathies: Secondary | ICD-10-CM | POA: Diagnosis not present

## 2021-01-12 DIAGNOSIS — I110 Hypertensive heart disease with heart failure: Secondary | ICD-10-CM | POA: Diagnosis not present

## 2021-01-12 DIAGNOSIS — E1136 Type 2 diabetes mellitus with diabetic cataract: Secondary | ICD-10-CM | POA: Diagnosis not present

## 2021-01-12 DIAGNOSIS — I5022 Chronic systolic (congestive) heart failure: Secondary | ICD-10-CM | POA: Diagnosis not present

## 2021-01-12 DIAGNOSIS — I4901 Ventricular fibrillation: Secondary | ICD-10-CM

## 2021-01-12 DIAGNOSIS — I472 Ventricular tachycardia, unspecified: Secondary | ICD-10-CM

## 2021-01-12 DIAGNOSIS — Z95810 Presence of automatic (implantable) cardiac defibrillator: Secondary | ICD-10-CM

## 2021-01-12 DIAGNOSIS — I7090 Unspecified atherosclerosis: Secondary | ICD-10-CM | POA: Diagnosis not present

## 2021-01-12 MED ORDER — ISOSORBIDE MONONITRATE ER 60 MG PO TB24: 60.0000 mg | ORAL_TABLET | Freq: Every day | ORAL | 3 refills | Status: DC

## 2021-01-12 NOTE — Patient Instructions (Signed)
Medication Instructions:  No changes  If you need a refill on your cardiac medications before your next appointment, please call your pharmacy.    Lab work: No new labs needed   If you have labs (blood work) drawn today and your tests are completely normal, you will receive your results only by: . MyChart Message (if you have MyChart) OR . A paper copy in the mail If you have any lab test that is abnormal or we need to change your treatment, we will call you to review the results.   Testing/Procedures: No new testing needed   Follow-Up: At [facility], you and your health needs are our priority.  As part of our continuing mission to provide you with exceptional heart care, we have created designated Provider Care Teams.  These Care Teams include your primary Cardiologist (physician) and Advanced Practice Providers (APPs -  Physician Assistants and Nurse Practitioners) who all work together to provide you with the care you need, when you need it.  . You will need a follow up appointment in 6 months  . Providers on your designated Care Team:   . Christopher Berge, NP . Ryan Dunn, PA-C . Jacquelyn Visser, PA-C  Any Other Special Instructions Will Be Listed Below (If Applicable).  COVID-19 Vaccine Information can be found at: [email or web address] For questions related to vaccine distribution or appointments, please email [email or web address] or call [phone].     

## 2021-01-25 ENCOUNTER — Ambulatory Visit (INDEPENDENT_AMBULATORY_CARE_PROVIDER_SITE_OTHER): Payer: Medicare HMO

## 2021-01-25 DIAGNOSIS — I5022 Chronic systolic (congestive) heart failure: Secondary | ICD-10-CM

## 2021-01-25 DIAGNOSIS — Z95810 Presence of automatic (implantable) cardiac defibrillator: Secondary | ICD-10-CM

## 2021-01-31 NOTE — Progress Notes (Signed)
EPIC Encounter for ICM Monitoring  Patient Name: Russell [name]. is a 70 y.o. male Date: 01/31/2021 Primary Care Physican: Smitty Cords, DO Primary Cardiologist:Gollan Electrophysiologist:Allred 2/7/2022Weight:159 lbs  Spoke with patient and reports feeling well at this time.  Denies fluid symptoms.    Optivol thoracic impedancenormal.  Labs: 08/17/2020 Creatinine 1.21, BUN 14, Potassium 4.2, Sodium 140, GFR 62-71 07/05/2020 Creatinine 1.23, BUN 19, Potassium 4.4, Sodium 140, GFR 61-70 A complete set of results can be found in Results Review.  Recommendations:No changes and encouraged to call if experiencing any fluid symptoms.  Follow-up plan: ICM clinic phone appointment on 02/28/2021. 91 day device clinic remote transmission2/14/2022.  EP/Cardiology Next Office Visit: Advised to call office to schedule appointment with Dr Johney Frame since there is a recall 02/19/2021 with Dr Johney Frame.  Recall 7/18/2022with Dr Mariah Milling   Copy of ICM check sent to Dr.Allred.   3 month ICM trend: 01/25/2021.    1 Year ICM trend:       [name], RN 01/31/2021 9:49 AM

## 2021-02-07 ENCOUNTER — Ambulatory Visit (INDEPENDENT_AMBULATORY_CARE_PROVIDER_SITE_OTHER): Payer: Medicare HMO

## 2021-02-07 DIAGNOSIS — I428 Other cardiomyopathies: Secondary | ICD-10-CM

## 2021-02-08 LAB — CUP PACEART REMOTE DEVICE CHECK
Battery Remaining Longevity: 125 mo
Battery Voltage: 3.02 V
Brady Statistic RV Percent Paced: 0.02 %
Date Time Interrogation Session: 20220214033523
HighPow Impedance: 55 Ohm
Implantable Lead Implant Date: 20200514
Implantable Lead Location: 753860
Implantable Lead Model: 6935
Implantable Pulse Generator Implant Date: 20200514
Lead Channel Impedance Value: 342 Ohm
Lead Channel Impedance Value: 399 Ohm
Lead Channel Pacing Threshold Amplitude: 0.75 V
Lead Channel Pacing Threshold Pulse Width: 0.4 ms
Lead Channel Sensing Intrinsic Amplitude: 13.375 mV
Lead Channel Sensing Intrinsic Amplitude: 13.375 mV
Lead Channel Setting Pacing Amplitude: 2 V
Lead Channel Setting Pacing Pulse Width: 0.4 ms
Lead Channel Setting Sensing Sensitivity: 0.3 mV

## 2021-02-10 NOTE — Progress Notes (Signed)
Remote ICD transmission.   

## 2021-02-13 ENCOUNTER — Encounter: Admit: 2021-02-13 | Payer: PRIVATE HEALTH INSURANCE | Attending: Urology | Primary: Internal Medicine

## 2021-02-13 DIAGNOSIS — R339 Retention of urine, unspecified: Secondary | ICD-10-CM

## 2021-02-16 DIAGNOSIS — H402213 Chronic angle-closure glaucoma, right eye, severe stage: Secondary | ICD-10-CM | POA: Diagnosis not present

## 2021-02-18 ENCOUNTER — Encounter: Payer: Self-pay | Admitting: Family Medicine

## 2021-02-18 ENCOUNTER — Ambulatory Visit (INDEPENDENT_AMBULATORY_CARE_PROVIDER_SITE_OTHER): Payer: Medicare HMO | Admitting: Family Medicine

## 2021-02-18 ENCOUNTER — Other Ambulatory Visit: Payer: Self-pay

## 2021-02-18 ENCOUNTER — Other Ambulatory Visit: Payer: Self-pay | Admitting: Family Medicine

## 2021-02-18 VITALS — BP 133/75 | HR 60 | Ht 68.0 in | Wt 159.6 lb

## 2021-02-18 DIAGNOSIS — I10 Essential (primary) hypertension: Secondary | ICD-10-CM

## 2021-02-18 DIAGNOSIS — Z0000 Encounter for general adult medical examination without abnormal findings: Secondary | ICD-10-CM

## 2021-02-18 DIAGNOSIS — N401 Enlarged prostate with lower urinary tract symptoms: Secondary | ICD-10-CM

## 2021-02-18 DIAGNOSIS — E1136 Type 2 diabetes mellitus with diabetic cataract: Secondary | ICD-10-CM

## 2021-02-18 DIAGNOSIS — R3911 Hesitancy of micturition: Secondary | ICD-10-CM

## 2021-02-18 DIAGNOSIS — I5022 Chronic systolic (congestive) heart failure: Secondary | ICD-10-CM

## 2021-02-18 LAB — POCT GLYCOSYLATED HEMOGLOBIN (HGB A1C): Hemoglobin A1C: 7.7 % — AB (ref 4.0–5.6)

## 2021-02-18 NOTE — Progress Notes (Signed)
Subjective:    Patient ID: Russell Joy., male    DOB: Feb 13, 1951, 70 y.o.   MRN: [id number]  Russell Savage. is a 70 y.o. male presenting on 02/18/2021 for Diabetes   HPI  CHRONIC DM, Type 2with cataracts/ Atherosclerotic vascular disease He is doing well, A1c 7.5 previously. Now due for A1c today CBGs:Checks CBG every other day, no new readings lately, average previously 120-150 Meds: Metformin 500mg  - previously reduced from BID Reports good compliance. Tolerating well w/o side-effects Currently on ACEi Lifestyle: Weight gain 1 lbs but overall steady -Diet (tries to follow DM diet, occasional fried food) -Exercise (He has a gym pass and plans to resume exercise) - He is now followed by [facility] see below, for glaucoma and cataracts Denies hypoglycemia, polyuria, visual changes, numbness or tingling.  CHRONIC HTN / Systolic CHF / Cardiomyopathy Reportslast visit Dr [facility] 06/2019- taken off of Amlodipine and on Entresto Current Meds -Carvedilol 25mg  BID, Entrestro 97-103 BID Reports good compliance, took meds today. Tolerating well, w/o complaints. Lifestyle: Denies CP, dyspnea, HA, edema, dizziness / lightheadedness  Health Maintenance:  Due for 2nd pneumonia vaccine >1 year after previous vaccine, now to receive Pneumovax-23 soon. He will return, declines again  Return with copy of COVID19 vaccine card for updating  Needs shingrix in future, declines now  Colon CA Screening: Last Colonoscopy 09/24/2013 (done by Dr ), results with no polyps, good for 10 years. Currently asymptomatic. known family history of colon CA  Cologuard done 09/01/20 - negative. Good for 3 years.   Depression screen [facility] 2/9 08/19/2020 08/22/2019 05/22/2019  Decreased Interest 0 0 0  Down, Depressed, Hopeless 0 0 0  PHQ - 2 Score 0 0 0    Social History   Tobacco Use  . Smoking status: Never Smoker  . Smokeless tobacco: Never Used   Vaping Use  . Vaping Use: Never used  Substance Use Topics  . Alcohol use: Yes    Comment: occassional  . Drug use: No    Review of Systems  Constitutional: Negative for activity change, appetite change, chills, diaphoresis, fatigue and fever.  HENT: Negative for congestion and hearing loss.   Eyes: Negative for visual disturbance.  Respiratory: Negative for cough, chest tightness, shortness of breath and wheezing.   Cardiovascular: Negative for chest pain, palpitations and leg swelling.  Gastrointestinal: Negative for abdominal pain, constipation, diarrhea, nausea and vomiting.  Endocrine: Negative for cold intolerance.  Genitourinary: Negative for difficulty urinating, dysuria, frequency and hematuria.  Musculoskeletal: Negative for arthralgias and neck pain.  Skin: Negative for rash.  Allergic/Immunologic: Negative for environmental allergies.  Neurological: Negative for dizziness, weakness, light-headedness, numbness and headaches.  Hematological: Negative for adenopathy.  Psychiatric/Behavioral: Negative for behavioral problems, dysphoric mood and sleep disturbance.   Per HPI unless specifically indicated above     Objective:    BP 133/75   Pulse 60   Ht 5\' 8"  (1.727 m)   Wt 159 lb 9.6 oz (72.4 kg)   SpO2 98%   BMI 24.27 kg/m   Wt Readings from Last 3 Encounters:  02/18/21 159 lb 9.6 oz (72.4 kg)  01/12/21 159 lb (72.1 kg)  08/19/20 158 lb 9.6 oz (71.9 kg)    Physical Exam Vitals and nursing note reviewed.  Constitutional:      General: He is not in acute distress.    Appearance: He is well-developed and well-nourished. He is not diaphoretic.     Comments: Well-appearing, comfortable, cooperative  HENT:     Head: Normocephalic and atraumatic.     Mouth/Throat:     Mouth: Oropharynx is clear and moist.  Eyes:     General:        Right eye: No discharge.        Left eye: No discharge.     Conjunctiva/sclera: Conjunctivae normal.  Neck:     Thyroid: No  thyromegaly.  Cardiovascular:     Rate and Rhythm: Normal rate and regular rhythm.     Pulses: Intact distal pulses.     Heart sounds: Normal heart sounds. No murmur heard.   Pulmonary:     Effort: Pulmonary effort is normal. No respiratory distress.     Breath sounds: Normal breath sounds. No wheezing or rales.  Musculoskeletal:        General: No edema. Normal range of motion.     Cervical back: Normal range of motion and neck supple.  Lymphadenopathy:     Cervical: No cervical adenopathy.  Skin:    General: Skin is warm and dry.     Findings: No erythema or rash.  Neurological:     Mental Status: He is alert and oriented to person, place, and time.  Psychiatric:        Mood and Affect: Mood and affect normal.        Behavior: Behavior normal.     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 CONTINUE Metformin XR 500mg  daily - discussed that no change for now could increase again in future if indicated A1c >8 2. Encourage improved lifestyle - low carb, low sugar diet, reduce portion size, continue improving regular exercise - reviewed diet recommendation 3. Check CBG, bring log to next visit for review 4. Continue ACEi, ASA - future reconsider starting statin - declined today       Relevant Orders   POCT HgB A1C  (Completed)      No orders of the defined types were placed in this encounter.    Follow up plan: Return in about 6 months (around 08/18/2021) for 6 month fasting lab only then 1 week later Annual Physical.  Future labs ordered for 08/18/21   08/20/21, DO [facility] 02/18/2021, 9:34 AM

## 2021-02-18 NOTE — Assessment & Plan Note (Signed)
Mostly controlled DM A1c 7.7, elevated today slightly again likely due to reduced activity in winter No evidence of hypoglycemia Complications - DM cataracts, glaucoma, other including atherosclerotic vascular disease - increases risk of future cardiovascular complications   Plan: 1. CONTINUE Metformin XR 500mg  daily - discussed that no change for now could increase again in future if indicated A1c >8 2. Encourage improved lifestyle - low carb, low sugar diet, reduce portion size, continue improving regular exercise - reviewed diet recommendation 3. Check CBG, bring log to next visit for review 4. Continue ACEi, ASA - future reconsider starting statin - declined today

## 2021-02-18 NOTE — Patient Instructions (Addendum)
Thank you for coming to the office today.  Please schedule and return for a NURSE ONLY VISIT for VACCINE - Need Pneumovax 23 - 2nd dose of pneumonia vaccine series, you already did the first dose 2 years ago. - Future Shingrix shingles at pharmacy 2 doses, 2 to 6 months apart.  Recent Labs    08/17/20 0838 02/18/21 0934  HGBA1C 7.5* 7.7*   Goal to improve lifestyle diet exercise control sugar goal A1c < 7 If we are still elevated or closer to 8, we can double metformin again in the future.     DUE for FASTING BLOOD WORK (no food or drink after midnight before the lab appointment, only water or coffee without cream/sugar on the morning of)  SCHEDULE "Lab Only" visit in the morning at the clinic for lab draw in 6 MONTHS   - Make sure Lab Only appointment is at about 1 week before your next appointment, so that results will be available  For Lab Results, once available within 2-3 days of blood draw, you can can log in to MyChart online to view your results and a brief explanation. Also, we can discuss results at next follow-up visit.    Please schedule a Follow-up Appointment to: Return in about 6 months (around 08/18/2021) for 6 month fasting lab only then 1 week later Annual Physical.  If you have any other questions or concerns, please feel free to call the office or send a message through MyChart. You may also schedule an earlier appointment if necessary.  Additionally, you may be receiving a survey about your experience at our office within a few days to 1 week by e-mail or mail. We value your feedback.  Saralyn Pilar, DO [facility], [place]

## 2021-02-28 ENCOUNTER — Ambulatory Visit (INDEPENDENT_AMBULATORY_CARE_PROVIDER_SITE_OTHER): Payer: Medicare HMO

## 2021-02-28 DIAGNOSIS — Z95810 Presence of automatic (implantable) cardiac defibrillator: Secondary | ICD-10-CM | POA: Diagnosis not present

## 2021-02-28 DIAGNOSIS — I5022 Chronic systolic (congestive) heart failure: Secondary | ICD-10-CM | POA: Diagnosis not present

## 2021-03-02 NOTE — Progress Notes (Signed)
EPIC Encounter for ICM Monitoring  Patient Name: Russell [name]. is a 70 y.o. male Date: 03/02/2021 Primary Care Physican: Smitty Cords, DO Primary Cardiologist:Gollan Electrophysiologist:Allred 2/7/2022Weight:159 lbs  Spoke with patient and reports feeling well at this time.  Denies fluid symptoms.    Optivol thoracic impedancenormal.  Labs: 08/17/2020 Creatinine 1.21, BUN 14, Potassium 4.2, Sodium 140, GFR 62-71 07/05/2020 Creatinine 1.23, BUN 19, Potassium 4.4, Sodium 140, GFR 61-70 A complete set of results can be found in Results Review.  Recommendations:No changes and encouraged to call if experiencing any fluid symptoms.  Follow-up plan: ICM clinic phone appointment on4/10/2021. 91 day device clinic remote transmission5/16/2022.  EP/Cardiology Next Office Visit: Advised to call office to schedule appointment with Dr Johney Frame since there is a recall 02/19/2021 with Dr Johney Frame.  Recall 7/18/2022with Dr Mariah Milling   Copy of ICM check sent to Dr.Allred.  3 month ICM trend: 02/28/2021.    1 Year ICM trend:       [name], RN 03/02/2021 5:09 PM

## 2021-03-16 ENCOUNTER — Telehealth: Payer: Self-pay

## 2021-03-16 NOTE — Telephone Encounter (Signed)
Fax came through stating pt was approved for PA with [facility] for the remainder of this calender year  Patient ID [phone]

## 2021-03-23 ENCOUNTER — Other Ambulatory Visit: Payer: Self-pay

## 2021-03-23 ENCOUNTER — Ambulatory Visit: Payer: Medicare HMO | Admitting: Internal Medicine

## 2021-03-23 ENCOUNTER — Encounter: Payer: Self-pay | Admitting: Internal Medicine

## 2021-03-23 VITALS — BP 146/82 | HR 60 | Ht 68.0 in | Wt 160.4 lb

## 2021-03-23 DIAGNOSIS — I10 Essential (primary) hypertension: Secondary | ICD-10-CM

## 2021-03-23 DIAGNOSIS — I428 Other cardiomyopathies: Secondary | ICD-10-CM

## 2021-03-23 DIAGNOSIS — I110 Hypertensive heart disease with heart failure: Secondary | ICD-10-CM

## 2021-03-23 DIAGNOSIS — I472 Ventricular tachycardia, unspecified: Secondary | ICD-10-CM

## 2021-03-23 DIAGNOSIS — I5022 Chronic systolic (congestive) heart failure: Secondary | ICD-10-CM

## 2021-03-23 LAB — COMPREHENSIVE METABOLIC PANEL
ALT: 25 IU/L (ref 0–44)
AST: 19 IU/L (ref 0–40)
Albumin/Globulin Ratio: 1.5 (ref 1.2–2.2)
Albumin: 4.1 g/dL (ref 3.8–4.8)
Alkaline Phosphatase: 52 IU/L (ref 44–121)
BUN/Creatinine Ratio: 10 (ref 10–24)
BUN: 15 mg/dL (ref 8–27)
Bilirubin Total: 0.3 mg/dL (ref 0.0–1.2)
CO2: 23 mmol/L (ref 20–29)
Calcium: 9.2 mg/dL (ref 8.6–10.2)
Chloride: 104 mmol/L (ref 96–106)
Creatinine, Ser: 1.48 mg/dL — ABNORMAL HIGH (ref 0.76–1.27)
Globulin, Total: 2.8 g/dL (ref 1.5–4.5)
Glucose: 185 mg/dL — ABNORMAL HIGH (ref 65–99)
Potassium: 4.7 mmol/L (ref 3.5–5.2)
Sodium: 140 mmol/L (ref 134–144)
Total Protein: 6.9 g/dL (ref 6.0–8.5)
eGFR: 52 mL/min/{1.73_m2} — ABNORMAL LOW (ref >59)

## 2021-03-23 LAB — TSH: TSH: 1.01 u[IU]/mL (ref 0.450–4.500)

## 2021-03-23 LAB — T4: T4, Total: 10.7 ug/dL (ref 4.5–12.0)

## 2021-03-23 MED ORDER — AMIODARONE HCL 200 MG PO TABS: 100.0000 mg | ORAL_TABLET | Freq: Every day | ORAL | 3 refills | Status: DC

## 2021-03-23 NOTE — Patient Instructions (Signed)
Medication Instructions:  Reduce you Amiodarone to 100 mg daily Your physician recommends that you continue on your current medications as directed. Please refer to the Current Medication list given to you today.  Labwork: Cmet, TSH, T4  Testing/Procedures: None ordered.  Follow-Up: Your physician wants you to follow-up in: one year with     Casimiro Needle "Mardelle Matte" Tillery, PA-C   You will receive a reminder letter in the mail two months in advance. If you don't receive a letter, please call our office to schedule the follow-up appointment.  Remote monitoring is used to monitor your Pacemaker of ICD from home. This monitoring reduces the number of office visits required to check your device to one time per year. It allows [place] to keep an eye on the functioning of your device to ensure it is working properly. You are scheduled for a device check from home on 05/09/21. You may send your transmission at any time that day. If you have a wireless device, the transmission will be sent automatically. After your physician reviews your transmission, you will receive a postcard with your next transmission date.  Any Other Special Instructions Will Be Listed Below (If Applicable).  If you need a refill on your cardiac medications before your next appointment, please call your pharmacy.

## 2021-03-23 NOTE — Progress Notes (Signed)
PCP: Smitty Cords, DO Primary Cardiologist: Dr Mariah Milling Primary EP: Dr Johney Frame  [name] is a 70 y.o. male who presents today for routine electrophysiology followup.  Since last being seen in our clinic, the patient reports doing very well.  Today, he denies symptoms of palpitations, chest pain, shortness of breath,  lower extremity edema, dizziness, presyncope, syncope, or ICD shocks.  The patient is otherwise without complaint today.   Past Medical History:  Diagnosis Date  . CAD (coronary artery disease)    05/07/2019 LHC showed nonobstructive CAD  . Diabetes (HCC)   . Frequent PVCs   . Heart failure with reduced ejection fraction (HCC)    04/2019 EF 20-25%  . Hypertension   . Impotence of organic origin   . NICM (nonischemic cardiomyopathy) (HCC)    HFrEF in setting of NICM  . Pain in joint, shoulder region   . Personal history of colonic polyps   . Ventricular tachycardia (HCC)    04/2019 in setting of cardiac arrest   Past Surgical History:  Procedure Laterality Date  . CARDIAC CATHETERIZATION  11/17/2013  . COLONOSCOPY  2009  . ICD IMPLANT N/A 05/08/2019   Procedure: ICD IMPLANT;  Surgeon: Hillis Range, MD;  Location: [facility];  Service: Cardiovascular;  Laterality: N/A;  . RIGHT/LEFT HEART CATH AND CORONARY ANGIOGRAPHY N/A 05/07/2019   Procedure: RIGHT/LEFT HEART CATH AND CORONARY ANGIOGRAPHY;  Surgeon: Yvonne Kendall, MD;  Location: [facility] INVASIVE CV LAB;  Service: Cardiovascular;  Laterality: N/A;    ROS- all systems are reviewed and negative except as per HPI above  Current Outpatient Medications  Medication Sig Dispense Refill  . amiodarone (PACERONE) 200 MG tablet TAKE 1 TABLET EVERY DAY 30 tablet 0  . brimonidine (ALPHAGAN) 0.2 % ophthalmic solution Place 1 drop into both eyes 2 (two) times daily.    . carvedilol (COREG) 25 MG tablet TAKE 1 TABLET TWICE DAILY 180 tablet 0  . Cholecalciferol (VITAMIN D3) 2000 units TABS Take 1 capsule by  mouth daily.     Marland Kitchen glucose blood test strip Use as instructed to check blood sugar up to x 2 daily 100 each 12  . isosorbide mononitrate (IMDUR) 60 MG 24 hr tablet Take 1 tablet (60 mg total) by mouth daily. 90 tablet 3  . Lancets (ONETOUCH ULTRASOFT) lancets Use as instructed to check blood sugar up to x 2 daily 100 each 12  . latanoprost (XALATAN) 0.005 % ophthalmic solution Place 1 drop into both eyes at bedtime.     . metFORMIN (GLUCOPHAGE-XR) 500 MG 24 hr tablet TAKE 1 TABLET BY MOUTH EVERY DAY WITH BREAKFAST 90 tablet 3  . nitroGLYCERIN (NITROSTAT) 0.4 MG SL tablet Place 1 tablet (0.4 mg total) under the tongue every 5 (five) minutes x 3 doses as needed for chest pain. 30 tablet 2  . polyvinyl alcohol (LIQUIFILM TEARS) 1.4 % ophthalmic solution Place 2 drops into both eyes every 8 (eight) hours. 15 mL 0  . sacubitril-valsartan (ENTRESTO) 97-103 MG Take 1 tablet by mouth 2 (two) times daily. 180 tablet 3  . tamsulosin (FLOMAX) 0.4 MG CAPS capsule Take 0.4 mg by mouth daily.    . timolol (TIMOPTIC) 0.5 % ophthalmic solution Place 1 drop into both eyes 2 (two) times daily.      No current facility-administered medications for this visit.    Physical Exam: Vitals:   03/23/21 0952  BP: (!) 146/82  Pulse: 60  SpO2: 98%  Weight: 160 lb 6.4  oz (72.8 kg)  Height: 5\' 8"  (1.727 m)    GEN- The patient is well appearing, alert and oriented x 3 today.   Head- normocephalic, atraumatic Eyes-  Sclera clear, conjunctiva pink Ears- hearing intact Oropharynx- clear Lungs- Clear to ausculation bilaterally, normal work of breathing Chest- ICD pocket is well healed Heart- Regular rate and rhythm, no murmurs, rubs or gallops, PMI not laterally displaced GI- soft, NT, ND, + BS Extremities- no clubbing, cyanosis, or edema  ICD interrogation- reviewed in detail today,  See PACEART report  ekg tracing ordered today is personally reviewed and shows sinus rhythm 60 bpm, PR 178 msec, QRS 116 msec, Qtc  464 msec  Wt Readings from Last 3 Encounters:  03/23/21 160 lb 6.4 oz (72.8 kg)  02/18/21 159 lb 9.6 oz (72.4 kg)  01/12/21 159 lb (72.1 kg)    Assessment and Plan:  1.  Chronic systolic dysfunction/ nonischemic CM/ prior VT euvolemic today VT is well controlled with amiodarone we wil need to follow him closely on this medicine to avoid toxicity Labs 7/21 reviewed PFTs 5/21 reviewed We will order lfts, tfts today Reduce amiodarone to 100mg  daily Stable on an appropriate medical regimen Normal ICD function See Pace Art report No changes today he is not device dependant today followed in ICM device clinic  2. HTN Stable No change required today bmet today  Risks, benefits and potential toxicities for medications prescribed and/or refilled reviewed with patient today.   Return to see EP PA annually  6/21 MD, [facility] 03/23/2021 9:58 AM

## 2021-03-27 ENCOUNTER — Telehealth: Payer: Self-pay | Admitting: Physician Assistant

## 2021-03-27 NOTE — Telephone Encounter (Signed)
Paged by Mr. and Mrs. [place] regarding episode of flushing sensation and a drop in the blood pressure this morning while at the church.  He has a history of nonischemic cardiomyopathy and VT.  Dose of amiodarone was recently lowered to 100 mg daily.  He also has intermittent drop in the blood pressure as well over the past year.  This morning at the church, he had a sudden onset of flushing sensation followed by weakness.  He denies any shock from his ICD device, nor was there any alarms.  After he got home, he checked his blood pressure which was 98/67.  Heart rate 53 bpm.  He has taken Entresto, carvedilol and Imdur this morning.  I instructed him to hold tonight's dose of Entresto before restarting tomorrow.  Starting tomorrow, he will reduce his Imdur from 60 mg down to 30 mg daily.  I also asked him to send over a remote transmission of his device to make sure there is no recurrence of ventricular tachycardia.  Will forward this message to both our device clinic and Dr. Mariah Milling.

## 2021-03-27 NOTE — Telephone Encounter (Signed)
Can we follow up with call to see what BP is doing? Before church, did he eat or drink anything before Am MEDS?

## 2021-03-28 NOTE — Telephone Encounter (Signed)
Sent to device clinic triage for review and follow up.

## 2021-03-28 NOTE — Telephone Encounter (Signed)
Manual transmission received 03/27/21 from ICD shows Normal device function. No logged arrythmias.   Symptoms do not seem to correlate with ICD function or HR.

## 2021-03-28 NOTE — Telephone Encounter (Addendum)
Was able to reach pt regarding his episode at church yesterday, reports he got real weak, sweating, flushed, and reports his BP was 98/60s. He called the cardiologist office for advice (see phone encounter from yesterday). Was able to upload his ICD device and report showed "Normal device function. No logged arrythmias.  Symptoms do not seem to correlate with ICD function or HR."    Pt reports yesterday before taking his medication all he had was a small bowl of cereal, advices to eat a good protein breakfast before cardiac meds and drink plenty of water. Pt verbalized understanding. Did reduce his IMDUR to 30 mg today as instructed from his usual 60 mg. Today BP 138/82 and pt feels "good". No cardiac related symptoms. Advise to monitor BP if start to trend up then may increase back to 60 mg IMDUR. Pt verbalized understanding. Would also monitor BS for when he has those episodes of sudden weakness, flushing, and diaphoresis. He is known type II diabetes and a bowel of cereal may not be enough for breakfast. Russell [name] verbalized understanding, will monitor for any more sudden symptoms, Otherwise all questions or concerns were address and no additional concerns at this time. Agreeable to plan, will call back for anything further.

## 2021-04-04 ENCOUNTER — Ambulatory Visit: Admit: 2021-04-04 | Payer: PRIVATE HEALTH INSURANCE | Attending: Urology | Primary: Internal Medicine

## 2021-04-04 ENCOUNTER — Encounter: Admit: 2021-04-04 | Payer: PRIVATE HEALTH INSURANCE | Attending: Urology | Primary: Internal Medicine

## 2021-04-04 ENCOUNTER — Ambulatory Visit (INDEPENDENT_AMBULATORY_CARE_PROVIDER_SITE_OTHER): Payer: Medicare HMO

## 2021-04-04 DIAGNOSIS — N189 Chronic kidney disease, unspecified: Secondary | ICD-10-CM

## 2021-04-04 DIAGNOSIS — N399 Disorder of urinary system, unspecified: Secondary | ICD-10-CM

## 2021-04-04 DIAGNOSIS — N401 Enlarged prostate with lower urinary tract symptoms: Secondary | ICD-10-CM

## 2021-04-04 DIAGNOSIS — I10 Essential (primary) hypertension: Secondary | ICD-10-CM

## 2021-04-04 DIAGNOSIS — R339 Retention of urine, unspecified: Secondary | ICD-10-CM

## 2021-04-04 DIAGNOSIS — I5022 Chronic systolic (congestive) heart failure: Secondary | ICD-10-CM

## 2021-04-04 DIAGNOSIS — Z95810 Presence of automatic (implantable) cardiac defibrillator: Secondary | ICD-10-CM | POA: Diagnosis not present

## 2021-04-04 MED ORDER — SULFAMETHOXAZOLE 800 MG-TRIMETHOPRIM 160 MG TABLET
800-160 mg | Status: AC
Start: 2021-04-04 — End: 2021-04-04

## 2021-04-04 MED ORDER — LIDOCAINE 2 % MUCOSAL JELLY IN APPLICATOR
2 % | Freq: Once | URETHRAL | Status: CP
Start: 2021-04-04 — End: ?
  Administered 2021-04-04: 20:00:00 2 mL via URETHRAL

## 2021-04-04 MED ORDER — TAMSULOSIN 0.4 MG CAPSULE
0.4 mg | Freq: Every day | ORAL | Status: AC
Start: 2021-04-04 — End: 2022-05-05

## 2021-04-04 MED ORDER — LABETALOL 100 MG TABLET
100 mg | Freq: Two times a day (BID) | ORAL | Status: DC
Start: 2021-04-04 — End: 2022-04-10

## 2021-04-04 MED ORDER — OXYBUTYNIN CHLORIDE IMMEDIATE RELEASE 5 MG TABLET
5 mg | ORAL | Status: SS
Start: 2021-04-04 — End: 2021-05-19

## 2021-04-04 MED ORDER — CEPHALEXIN 500 MG CAPSULE
500 mg | Status: AC
Start: 2021-04-04 — End: 2021-04-04

## 2021-04-04 MED ORDER — FOSFOMYCIN TROMETHAMINE 3 GRAM ORAL PACKET
3 gram | Freq: Once | ORAL | Status: CP
Start: 2021-04-04 — End: ?
  Administered 2021-04-04: 20:00:00 3 gram via ORAL

## 2021-04-04 MED ORDER — HYDRALAZINE 50 MG TABLET
50 mg | Freq: Three times a day (TID) | ORAL | Status: AC
Start: 2021-04-04 — End: ?

## 2021-04-04 MED ORDER — SACCHAROMYCES BOULARDII 250 MG CAPSULE
250 mg | ORAL | Status: AC
Start: 2021-04-04 — End: 2022-05-05

## 2021-04-04 MED ORDER — AMOXICILLIN 500 MG CAPSULE
500 mg | Status: AC
Start: 2021-04-04 — End: 2021-04-04

## 2021-04-04 NOTE — Progress Notes
Marc Evans is here today for a cystoscopy.  Patient informed of procedure and questions answered.  Instructions for pre procedure given to patient. Pre-procedure antibiotic administered per protocol. Written consent and time out obtained. 10mL balloon deflated completely and #36F catheter removed intact. Patient prepped according to protocol.  After procedure, new #36F coude catheter inserted by Dr. Oliva Bustard and 10mL balloon inflated. Stat-lock applied to left thigh. Leg bag attached. Tolerated procedure well. Extra supplies sent with patient. VS obtained, BP noted to be elevated pre- and post-procedure. Pt states he did not take his prescribed hydralazine as he was unsure what would be done at his visit today. Pt denies any subjective symptoms of hypertension. Post procedure instructions reviewed with patient. Strict call/ED precautions reviewed and pt verbalized understanding.ERAS pre-op HOLEP instructions reviewed with [name] in detail. Post-op expectations and Kegel exercises reviewed. Pt verbalized understanding and will await a call from surgical scheduling within 7 business days. He will schedule pre-op clearance with PA Cruz. He will go to [facility] 2 weeks prior to surgery for CBC, CMP- orders placed. He is aware he needs a nurse visit 2-3 weeks prior to surgery for Foley change for pre-op UC and will schedule once he knows his surgery date. Instructions provided in verbal and written form. Per Dr. Oliva Bustard, pt will stay overnight in hospital for surgery and Foley will be removed the next day.

## 2021-04-04 NOTE — Progress Notes
Time Out Documentation?	Procedure Name: Cystoscopy?	Procedure Time: 4:06 PM ?	Team Members Present: Marena Chancy, RN, Pamella Pert, MD?	Time Out initiated after patient positioned & prior to beginning of procedure: Yes?	Name of Patient  & MRUN # or DOB stated and matches ID band or previously confirmed med record number: Yes?	Proceduralist states or confirms procedure to be performed: Yes?	Procedural consent is used to verify procedure performed  & matches pt identifiers:Yes?	Site of procedure(s) (with laterality or level) is topically marked per policy & visible after draping:N/A ?	Final check completed on all specimens: not applicable?	Serial number on device: [id number]

## 2021-04-04 NOTE — Progress Notes
[name] is a 70 y.o. male who presents with a chief complaint of BPHThe patient has hx of bph and urinary retention with severe hydronephrosis and renal failure. Symptoms before dx was incontinence.He had been managed with bil neph tubes which have been removed.The patient had a TURP on 12/28/20 (Dr Deon Pilling). He failed voiding trial after the TURP.Path was 4 gms of tissue. Cr down to 2.7He is living in his car. He has two masters degrees.No family hx of prostate cancer.  Recent PSA Results:No flowsheet data found.Past Medical HistoryPast Medical History: Diagnosis Date ? Hypertension  Past Surgical HistoryPast Surgical History: Procedure Laterality Date ? TONSILLECTOMY    as a child ? 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 AllergiesNo Known AllergiesMedicationsOutpatient Encounter Medications as of 04/04/2021 Medication Sig Dispense Refill ? amoxicillin (AMOXIL) 500 mg capsule TAKE ONE CAPSULE BY MOUTH THREE TIMES A DAY FOR 10 DAYS   ? cephALEXin (KEFLEX) 500 mg capsule TAKE ONE CAPSULE BY MOUTH THREE TIMES A DAY   ? fish oil-omega-3 fatty acids 1,000 mg capsule Take 2 g by mouth daily.   ? hydrALAZINE (APRESOLINE) 50 mg tablet Take 50 mg by mouth.   ? labetaloL (NORMODYNE) 100 mg tablet TAKE ONE TABLET BY MOUTH EVERY 12 HOURS AROUND THE CLOCK   ? oxybutynin (DITROPAN) 5 mg immediate release tablet Take 5 mg by mouth.   ? Saccharomyces boulardii (FLORASTOR) 250 mg capsule Take 250 mg by mouth.   ? sulfamethoxazole-trimethoprim (BACTRIM DS;CO-TRIMOXAZOLE DS) 800-160 mg per tablet TAKE ONE TABLET BY MOUTH TWICE A DAY FOR 10 DAYS   ? tamsulosin (FLOMAX) 0.4 mg 24 hr capsule Take 0.4 mg by mouth daily.   No facility-administered encounter medications on file as of 04/04/2021.  Social HistorySocial History Tobacco Use ? Smoking status: Never Smoker Substance Use Topics ? Alcohol use: No ? Drug use: No Family History Family History Problem Relation Age of Onset ? Arrhythmia Mother  ? Heart failure Father  ? Heart attack Father  ? No Known Problems Sister  ? No Known Problems Brother  Review of SystemsConstitutional: negative for weight changeCardiovascular: negative for chest painRespiratory: negative for cough or SOBAll other systems reviewed and are negative Physical ExamTemp 97 ?F (36.1 ?C) (No-touch scanner)  - Ht 6' (1.829 m)  - Wt 99.8 kg  - BMI 29.84 kg/m? Constitutional:  Well developed, no acute distress.  EENT: Sclera normal, normal nares and mucosaRespiratory:  Normal respiratory effortCardiovascular: RRR,  No evidence of extremity swelling.Abdomen: No masses are palpated.  No tenderness. Inguinal: No abnormalities in either inguinal area.  Skin: warm and dryMusculoskeletal: Gait appears normal.  Neurological: well oriented to time, place, and person.   Kidney: No CVA tenderness Bladder: The bladder appears to be empty.  No masses are palpated.  There is no tenderness.  Lymphatics: There are no abnormal nodes palpated in either inguinal area.Penis: normal and is circumcised. No external lesions or masses are present. Urethral Meatus: The meatus is normal in size and location.Scrotum:  No erythema or edema of scrotal skin.   Testicles: Right testicle normal to palpation. Left testicle normal to palpation.Epididymides: Right epididymis and vas deferens are normal to palpation. Left epididymis and vas deferens normal to palpation.  No masses or tenderness noted.   Anus/Perineum: No anal or perineal masses noted.   Sphincter: The sphincter has good tone.   No rectal mass is noted.   No hemorrhoids are notedProstate: >>100 cc, normal consistency, nontender, symmetric  No results found for: Claudina Lick, USPECGRAVITY,  UPH, Sheron Nightingale, UBLOOD, ULEUKOCYTES   Post Void Bladder Scan Measurement Date Value Ref Range Status 04/04/2021 104 mL Final Assessment:Marc Evans is a 70 y.o. male with bph and urinary retention, obstructive nephropathyPlan:DiscussionBPH:I have discussed BPH with patient, along with the various treatments.  Discussed role of watchful waiting and lifestyle modifications (decrease caffeine, PM fluids, alcohol) to help with the symptoms of BPH. Discussed medical management with alpha blockers and/or 5-alpha reductase inhibitors including side effects of these medications. Also discussed surgical management of BPH with TURP/TUIP, UroLift, laser treatment (ablation, HOLEP), prostate artery embolization,  and open surgery. I explained the prognosis and possible complications of each surgery. The patient expresses an understanding of the treatment, possible reactions, and possible prognosis.The patient has a massive prostate- hx of urinary retention with obstructive nephropathyObtain MRI prostate to further define anatomy and surgical planningPlan on HOLEPHOLEP (Holmium laser enucleation of the prostate)I explained a HOLEP is a surgery performed with special equipment through the urethra to remove  obstructing prostate tissue that is causing urinary symptoms.  Usually a Foley catheter will be placed and continuous bladder irrigation is initiated following surgery, and the patient will be monitored in the hospital for 1 to 2 days. It is common to have irritative voiding symptoms such as urinary urgency/frequency/nocturia for several weeks to months following a HOLEP; it is also common to see blood in the urine during this time period. There is a risk of retrograde ejaculation with a HOLEP. Additional risks are a urethral stricture and bladder neck contracture which would possibly require further treatment. The risk of erectile dysfunction is very low following a HOLEP. Some patients will describe post void dribbling and is it fairly common to have transient stress urinary incontinence which usually resolves over several months.  The patient understands the risk and benefits of a HOLEP and would like to proceed with surgery.Orders Placed This Encounter Procedures ? POCT Post Void Residual No follow-ups on file.Patient will let us know if any new problems or symptoms arise in the interim.Pamella Pert, MDYale [phone]

## 2021-04-04 NOTE — Patient Instructions
Cystoscopy, Care After  Refer to this sheet in the next few weeks. These instructions provide you with information about caring for yourself after your procedure. Your health care provider may also give you more specific instructions. Your treatment has been planned according to current medical practices, but problems sometimes occur. Call your health care provider if you have any problems or questions after your procedure.  What can I expect after the procedure?  After the procedure, it is common to have:  ? Mild pain when you urinate. Pain should stop within a few minutes after you urinate. This may last for up to 1 week.  ? A small amount of blood in your urine for several days.  ? Feeling like you need to urinate but producing only a small amount of urine.    Follow these instructions at home:    Medicines  ? Take over-the-counter and prescription medicines only as told by your health care provider.  ? If you were prescribed an antibiotic medicine, take it as told by your health care provider. Do not stop taking the antibiotic even if you start to feel better.  General instructions    ? Return to your normal activities as told by your health care provider. Ask your health care provider what activities are safe for you.  ? Do not drive for 24 hours if you received a sedative.  ? Watch for any blood in your urine. If the amount of blood in your urine increases, call your health care provider.  ? Follow instructions from your health care provider about eating or drinking restrictions.  ? If a tissue sample was removed for testing (biopsy) during your procedure, it is your responsibility to get your test results. Ask your health care provider or the department performing the test when your results will be ready.  ? Drink enough fluid to keep your urine clear or pale yellow.  ? Keep all follow-up visits as told by your health care provider. This is important.  Contact a health care provider if:  ? You have pain that gets worse or does not get better with medicine, especially pain when you urinate.  ? You have difficulty urinating.  Get help right away if:  ? You have more blood in your urine.  ? You have blood clots in your urine.  ? You have abdominal pain.  ? You have a fever or chills.  ? You are unable to urinate.  This information is not intended to replace advice given to you by your health care provider. Make sure you discuss any questions you have with your health care provider.  Document Released: 06/30/2005 Document Revised: 05/18/2016 Document Reviewed: 10/28/2015  Elsevier Interactive Patient Education ? 2019 [employer]      Indwelling Urinary Catheter Care, Adult  An indwelling urinary catheter is a thin tube that is put into your bladder. The tube helps to drain pee (urine) out of your body. The tube goes in through your urethra. Your urethra is where pee comes out of your body. Your pee will come out through the catheter, then it will go into a bag (drainage bag).  Take good care of your catheter so it will work well.  How to wear your catheter and bag  Supplies needed  ? Sticky tape (adhesive tape) or a leg strap.  ? Alcohol wipe or soap and water (if you use tape).  ? A clean towel (if you use tape).  ? Large overnight bag.  ?  Smaller bag (leg bag).  Wearing your catheter  Attach your catheter to your leg with tape or a leg strap.  ? Make sure the catheter is not pulled tight.  ? If a leg strap gets wet, take it off and put on a dry strap.  ? If you use tape to hold the bag on your leg:  1. Use an alcohol wipe or soap and water to wash your skin where the tape made it sticky before.  2. Use a clean towel to pat-dry that skin.  3. Use new tape to make the bag stay on your leg.  Wearing your bags  You should have been given a large overnight bag.  ? You may wear the overnight bag in the day or night.  ? Always have the overnight bag lower than your bladder.  Do not let the bag touch the floor.  ? Before you go to sleep, put a clean plastic bag in a wastebasket. Then hang the overnight bag inside the wastebasket.  You should also have a smaller leg bag that fits under your clothes.  ? Always wear the leg bag below your knee.  ? Do not wear your leg bag at night.  How to care for your skin and catheter  Supplies needed  ? A clean washcloth.  ? Water and mild soap.  ? A clean towel.  Caring for your skin and catheter  ? Clean the skin around your catheter every day:  1. Wash your hands with soap and water.  2. Wet a clean washcloth in warm water and mild soap.  3. Clean the skin around your urethra.  ? If you are male:  ? Gently spread the folds of skin around your vagina (labia).  ? With the washcloth in your other hand, wipe the inner side of your labia on each side. Wipe from front to back.  ? If you are male:  ? Pull back any skin that covers the end of your penis (foreskin).  ? With the washcloth in your other hand, wipe your penis in small circles. Start wiping at the tip of your penis, then move away from the catheter.  ? Move the foreskin back in place, if needed.  4. With your free hand, hold the catheter close to where it goes into your body.  ? Keep holding the catheter during cleaning so it does not get pulled out.  5. With the washcloth in your other hand, clean the catheter.  ? Only wipe downward on the catheter.  ? Do not wipe upward toward your body. Doing this may push germs into your urethra and cause infection.  6. Use a clean towel to pat-dry the catheter and the skin around it. Make sure to wipe off all soap.  7. Wash your hands with soap and water.  ? Shower every day. Do not take baths.  ? Do not use cream, ointment, or lotion on the area where the catheter goes into your body, unless your doctor tells you to.  ? Do not use powders, sprays, or lotions on your genital area.  ? Check your skin around the catheter every day for signs of infection. Check for:  ? Redness, swelling, or pain.  ? Fluid or blood.  ? Warmth.  ? Pus or a bad smell.         How to empty the bag  Supplies needed  ? Rubbing alcohol.  ? Gauze pad or cotton ball.  ?  Tape or a leg strap.  Emptying the bag  Pour the pee out of your bag when it is ??? full, or at least 2?3 times a day. Do this for your overnight bag and your leg bag.  1. Wash your hands with soap and water.  2. Separate (detach) the bag from your leg.  3. Hold the bag over the toilet or a clean pail. Keep the bag lower than your hips and bladder. This is so the pee (urine) does not go back into the tube.  4. Open the pour spout. It is at the bottom of the bag.  5. Empty the pee into the toilet or pail. Do not let the pour spout touch any surface.  6. Put rubbing alcohol on a gauze pad or cotton ball.  7. Use the gauze pad or cotton ball to clean the pour spout.  8. Close the pour spout.  9. Attach the bag to your leg with tape or a leg strap.  10. Wash your hands with soap and water.  Follow instructions for cleaning the drainage bag:  ? From the product maker.  ? As told by your doctor.  How to change the bag  Supplies needed  ? Alcohol wipes.  ? A clean bag.  ? Tape or a leg strap.  Changing the bag  Replace your bag when it starts to leak, smell bad, or look dirty.  1. Wash your hands with soap and water.  2. Separate the dirty bag from your leg.  3. Pinch the catheter with your fingers so that pee does not spill out.  4. Separate the catheter tube from the bag tube where these tubes connect (at the connection valve). Do not let the tubes touch any surface.  5. Clean the end of the catheter tube with an alcohol wipe. Use a different alcohol wipe to clean the end of the bag tube.  6. Connect the catheter tube to the tube of the clean bag.  7. Attach the clean bag to your leg with tape or a leg strap. Do not make the bag tight on your leg.  8. Wash your hands with soap and water.  General rules  ? Never pull on your catheter. Never try to take it out. Doing that can hurt you.  ? Always wash your hands before and after you touch your catheter or bag. Use a mild, fragrance-free soap. If you do not have soap and water, use hand sanitizer.  ? Always make sure there are no twists or bends (kinks) in the catheter tube.  ? Always make sure there are no leaks in the catheter or bag.  ? Drink enough fluid to keep your pee pale yellow.  ? Do not take baths, swim, or use a hot tub.  ? If you are male, wipe from front to back after you poop (have a bowel movement).    Contact a doctor if:  ? Your pee is cloudy.  ? Your pee smells worse than usual.  ? Your catheter gets clogged.  ? Your catheter leaks.  ? Your bladder feels full.  Get help right away if:  ? You have redness, swelling, or pain where the catheter goes into your body.  ? You have fluid, blood, pus, or a bad smell coming from the area where the catheter goes into your body.  ? Your skin feels warm where the catheter goes into your body.  ? You have a fever.  ? You  have pain in your:  ? Belly (abdomen).  ? Legs.  ? Lower back.  ? Bladder.  ? You see blood in the catheter.  ? Your pee is pink or red.  ? You feel sick to your stomach (nauseous).  ? You throw up (vomit).  ? You have chills.  ? Your pee is not draining into the bag.  ? Your catheter gets pulled out.  Summary  ? An indwelling urinary catheter is a thin tube that is placed into the bladder to help drain pee (urine) out of the body.  ? The catheter is placed into the part of the body that drains pee from the bladder (urethra).  ? Taking good care of your catheter will keep it working properly and help prevent problems.  ? Always wash your hands before and after touching your catheter or bag.  ? Never pull on your catheter or try to take it out.  This information is not intended to replace advice given to you by your health care provider. Make sure you discuss any questions you have with your health care provider.  Document Revised: 04/04/2019 Document Reviewed: 07/27/2017  Elsevier Patient Education ? 2021 Elsevier Inc.      [facility] Pre-Op Instructions      Name of Surgery:HOLEP  Surgery Date: TBD  Surgical Scheduler: Dee  A surgical scheduler will contact you within 7 business days to schedule your surgery. In some cases, they may hold a scheduling date for you that you will see in MyChart; rest assured if that date does not work with your schedule, the surgical scheduler will work with you to arrange a time that is convenient and appropriate for your care.   Office Phone: [phone]    ON THE DAY BEFORE SURGERY, YOU WILL BE NOTIFIED BY AN AUTOMATED SERVICE BETWEEN 3:00 - 5:00 PM REGARDING THE TIME TO REPORT TO THE HOSPITAL AND PRE-OPERATIVE INSTRUCTIONS.    If you miss this call, you can contact: [phone] [facility])    ? Please call Dr. Shon Hough office to schedule pre-op clearance within 1 month of surgery  ? Please go to [facility] 2 weeks before surgery for fasting blood work  ? Please schedule a nurse visit for Foley catheter change 2-3 weeks before surgery  ? Do not eat anything after midnight before surgery including mints or candy. Clear liquids are permitted from midnight until 2 hours prior to the scheduled arrival time, unless otherwise instructed by your physician.  ? NO liquids should be consumed after midnight if there is a history or symptoms of gastric reflux (GERD), nausea, vomiting, difficulty swallowing, abdominal pain, or cancer of the stomach or esophagus.  ? Allowable clear liquids include the following: water, sports drinks (i.e. Gatorade, Powerade), apple juice, black coffee or tea without cream/milk, carbonated beverages and sodas. Note: Limit caffeinated beverages to less than 16 ounces.  ? Please drink one 12 oz cup of apple juice or Gatorade 2 hours prior to your arrival to the hospital.  ? If you have been instructed to take medications by the anesthesia staff, take with a small sip of water.  ? Do not take aspirin, aspirin products (check labels), or anti-inflammatory drugs 1 week prior to surgery. No Ibuprofen, Advil, Motrin, Aleve or Naproxen  ? If you need any type of pain medication, Tylenol based products are fine.  ? Stop all multivitamins and herbal supplements 1 week prior to surgery.  ? Please note, that you must also  have someone to drive you home upon discharge if you are having One Day Surgery.  ? NOTE: IF YOU ARE ON COUMADIN, WARFARIN, PLAVIX, OR ANY BLOOD THINNERS, PLEASE CONTACT YOUR PRESCRIBING PHYSICIAN TO DETERMINE WHEN YOU SHOULD STOP THE MEDICATION PRIOR TO SURGERY.    ? You will need COVID testing 24-72 hours prior to surgery. You will be contacted to schedule a few days before surgery by the COVID testing team.    o If you have been diagnosed with COVID-19 within 8-10 weeks of your surgery date, please let us know. Your surgeon will need to determine if it will be safe for you to undergo a surgical procedure.  ? Please do not schedule/receive the COVID vaccine 48 hours before or after surgery.      Additional Notes:       ? Please inform us immediately if you have any changes to your insurance. You can call the registration department at [phone] to make the changes.   ? All patient billing related inquires for any [facility] [place], [place], [place], [place], and [place]) should be directed to the [employer] at [phone]. This is our hospital billing call center and this team responds to inquiries for all [facility]. Our hours of operation are Monday ? Friday 730am ? 5pm.     [email or web address]     You have been referred by your provider for HoLEP procedure for BPH. Below is an overview of the procedure and what to expect before, during and after surgery. Please keep in mind not all patients follow the same course based on specific anatomy and medical and surgical history. Specific questions should be addressed with your provider and nurse.     HoLEP  The Holmium Laser Enucleation of the Prostate (HoLEP) is a laser surgery used to treat benign prostatic hyperplasia. With BPH, the prostate becomes enlarged and can cause obstruction of urine flow. This can lead to many symptoms such as urinary frequency, excessive urination overnight, difficulty starting urination, or even inability to urinate. With the HoLEP, a laser is used to cut and remove the prostate tissue that is causing this blockage. After healing from this surgery, most patients experience a decrease in urinary symptoms and the ability to urinate with ease.    Before surgery:    -You will need a pre-operative consultation which will include a discussion of medical history along with a physical examination. BPH symptoms will be assessed. A bladder scan will be measured to see how much urine is left in the bladder after urination.    -You may need a cystoscopy, which is done in the office. This will allow the urethra, prostate, and bladder to be visualized to determine if HoLEP is the best option.    -If you decide to proceed with the HoLEP, one of our Surgical Schedulers will schedule the surgery and communicate with you about any blood work or tests that will be needed prior to surgery.     -The use of any blood thinners must be discontinued before surgery. You will need to contact your PCP or cardiologist that prescribes the medication to let them know you are scheduled for this procedure and be sure they approve of stopping the medication. If you are UNABLE to stop blood thinners you MUST contact our office to discuss with the provider/nurse. Aspirin and NSAID usage needs to be discontinued 10 days before surgery. Again, if you  are UNABLE to stop these medications you MUST contact our office.     -The day before surgery, the patient is contacted between 3pm-5pm by an automated phone call regarding the time to report to the hospital and the pre-operative instructions related to Operating Room and Hospital policies.     During surgery:    -This surgery is typically done under general anesthesia and can take anywhere from 1-4 hours, depending on the size of the prostate.    -All instruments for this surgery are inserted through the urethra, which means there are no incisions on the body.    -A special scope is inserted in the penis, then a laser is inserted and used to enucleate (core out) the obstructive prostate tissue from it's capsule (outer shell), often compared to peeling an orange from the inside. The excised prostate tissue is then removed. At the end of the surgery, a catheter is placed.     -The prostate tissue is then sent to the Pathology lab to check for any signs of cancer.    After Surgery:   - Patients are typically sent home the same day as surgery, although some patients may require a 1 night stay in the hospital.    -Patients are usually discharged with a urinary catheter in place, which will be removed in the office 2 days after surgery. This will be scheduled as a Nurse Visit for a voiding trial.    -For some patients, the catheter is removed before discharge from the hospital. You will then follow up with our office 2 days after surgery to have a bladder scan completed.    -Blood in the urine is expected after this surgery, often at the beginning and end of your urinary stream.    -Once the catheter is removed, it is common to experience urinary leakage, especially with activity or position changes. Leakage usually improves within 6-12 weeks. The chance of long term leakage after this surgery is very low. It is common to experience urinary frequency and urgency for several weeks after this surgery.    -Kegel exercises (pelvic floor exercises) should be done post operatively to help stop leakage. This exercise should not be done while urinating.    -No strenuous activity, heavy lifting, or sexual activity for 3 weeks. Time off from work depends on the nature of your job. Please discuss this with your provider.    -Follow up appointments after surgery are typically scheduled around 4 weeks and 4 months post operatively.    If you have any questions or concerns, please feel free to contact our office at [phone] or send a message via MyChart.       How to do Kegel Exercises     What are Kegel exercises?     Kegel exercises are designed to make your pelvic floor muscles stronger. These muscles help hold up your bladder and keep you from leaking urine. By strengthening these muscles, you will help your bladder control. However, learning how to do these exercises can be difficult because you cannot see the muscles you are exercising. Many patients ask, ?How do I know if I am squeezing the right muscles??     How can I find the muscles I should be using?   1. Try and stop your urine from flowing while you are urinating. If you can stop it, you are squeezing the correct muscle.   2. Pretend that you are on a crowded elevator and you have to pass  gas. Squeeze your muscle to help hold the gas in.     Once I find the right muscles, how should I exercise them?   To do Kegel exercises:   Contract these muscles for a slow count of five.   Release the muscles to a slow count of five.   Repeat this 10 times (this is called one set of exercise)   Do one set of 10 Kegels three times a day, every day.     Things people commonly do wrong:   Do not squeeze your belly or leg muscles. No one should know you are exercising your pelvic muscles if you do it right.   Do not bear down or strain.   Do not hold your breath. You should be able to do this exercise even while you are talking.     When can I expect to see the results of Kegel exercises?   Seeing results with any exercise takes time, so be patient. If you do Kegel exercises three times a day, you should notice better bladder control in three to six weeks; some men see it even sooner.     Do these exercises only as instructed. Doing these exercises more often than instructed can lead to muscle fatigue (making the muscles tired) and then they will have no benefit.     Never do Kegel exercises while you have a urine catheter in place.     NOTE: This information is a summary. It may not cover all possible information and is not intended to replace advice given to you by your health care provider. Make sure you discuss any questions you have with your health care provider.     If you have any questions about this information, please call the office.

## 2021-04-05 DIAGNOSIS — N138 Other obstructive and reflux uropathy: Secondary | ICD-10-CM

## 2021-04-05 DIAGNOSIS — R399 Unspecified symptoms and signs involving the genitourinary system: Secondary | ICD-10-CM

## 2021-04-06 NOTE — Progress Notes (Signed)
EPIC Encounter for ICM Monitoring  Patient Name: Russell [name]. is a 70 y.o. male Date: 04/06/2021 Primary Care Physican: Smitty Cords, DO Primary Cardiologist:Gollan Electrophysiologist:Allred 4/13/2022Weight:160 lbs  Spoke with patient and reports feeling well at this time.  Denies fluid symptoms.    Optivol thoracic impedancenormal.  Labs: 03/23/2021 Creatinine 1.48, BUN 15, Potassium 4.7, Sodium 140, GFR 52 A complete set of results can be found in Results Review.  Recommendations:No changes and encouraged to call if experiencing any fluid symptoms.  Follow-up plan: ICM clinic phone appointment on5/31/2022. 91 day device clinic remote transmission5/16/2022.  EP/Cardiology Next Office Visit:  Recall 03/19/2022 with [name], PA.   Recall 7/18/2022with Dr Mariah Milling   Copy of ICM check sent to Dr.Allred.  3 month ICM trend: 04/04/2021.    1 Year ICM trend:       [name], RN 04/06/2021 4:52 PM

## 2021-04-11 ENCOUNTER — Other Ambulatory Visit: Payer: Self-pay | Admitting: Cardiovascular Disease

## 2021-04-12 ENCOUNTER — Ambulatory Visit (INDEPENDENT_AMBULATORY_CARE_PROVIDER_SITE_OTHER): Payer: Medicare HMO

## 2021-04-12 ENCOUNTER — Other Ambulatory Visit: Payer: Self-pay | Admitting: Student

## 2021-04-12 VITALS — Ht 68.0 in | Wt 160.0 lb

## 2021-04-12 DIAGNOSIS — Z0000 Encounter for general adult medical examination without abnormal findings: Secondary | ICD-10-CM | POA: Diagnosis not present

## 2021-04-12 NOTE — Progress Notes (Signed)
I connected with Russell Joyavid Clippinger [name]. today by telephone and verified that I am speaking with the correct person using two identifiers. Location patient: home Location provider: work Persons participating in the virtual visit: Russell [name], Elisha PonderNickeah Karlie Aung LPN.   I discussed the limitations, risks, security and privacy concerns of performing an evaluation and management service by telephone and the availability of in person appointments. I also discussed with the patient that there may be a patient responsible charge related to this service. The patient expressed understanding and verbally consented to this telephonic visit.    Interactive audio and video telecommunications were attempted between this provider and patient, however failed, due to patient having technical difficulties OR patient did not have access to video capability.  We continued and completed visit with audio only.     Vital signs may be patient reported or missing.  Subjective:   Russell JoyDavid Burkholder [name]. is a 70 y.o. male who presents for an Initial Medicare Annual Wellness Visit.  Review of Systems     Cardiac Risk Factors include: advanced age (>[id number], 73>65 women);diabetes mellitus;hypertension;male gender     Objective:    Today's Vitals   04/12/21 1141  Weight: 160 lb (72.6 kg)  Height: 5\' 8"  (1.727 m)   Body mass index is 24.33 kg/m.  Advanced Directives 04/12/2021 05/07/2019  Does Patient Have a Medical Advance Directive? Yes No  Type of Estate agentAdvance Directive Healthcare Power of [place];Living will -  Copy of Healthcare Power of Attorney in Chart? No - copy requested -  Would patient like information on creating a medical advance directive? - No - Patient declined    Current Medications (verified) Outpatient Encounter Medications as of 04/12/2021  Medication Sig  . amiodarone (PACERONE) 200 MG tablet Take 0.5 tablets (100 mg total) by mouth daily.  . brimonidine (ALPHAGAN) 0.2 % ophthalmic solution Place 1 drop into both  eyes 2 (two) times daily.  . carvedilol (COREG) 25 MG tablet TAKE 1 TABLET TWICE DAILY  . Cholecalciferol (VITAMIN D3) 2000 units TABS Take 1 capsule by mouth daily.   Marland Kitchen. glucose blood test strip Use as instructed to check blood sugar up to x 2 daily  . isosorbide mononitrate (IMDUR) 60 MG 24 hr tablet Take 1 tablet (60 mg total) by mouth daily.  . Lancets (ONETOUCH ULTRASOFT) lancets Use as instructed to check blood sugar up to x 2 daily  . latanoprost (XALATAN) 0.005 % ophthalmic solution Place 1 drop into both eyes at bedtime.   . metFORMIN (GLUCOPHAGE-XR) 500 MG 24 hr tablet TAKE 1 TABLET BY MOUTH EVERY DAY WITH BREAKFAST  . nitroGLYCERIN (NITROSTAT) 0.4 MG SL tablet Place 1 tablet (0.4 mg total) under the tongue every 5 (five) minutes x 3 doses as needed for chest pain.  . polyvinyl alcohol (LIQUIFILM TEARS) 1.4 % ophthalmic solution Place 2 drops into both eyes every 8 (eight) hours.  . sacubitril-valsartan (ENTRESTO) 97-103 MG Take 1 tablet by mouth 2 (two) times daily.  . tamsulosin (FLOMAX) 0.4 MG CAPS capsule Take 0.4 mg by mouth daily.  . timolol (TIMOPTIC) 0.5 % ophthalmic solution Place 1 drop into both eyes 2 (two) times daily.    No facility-administered encounter medications on file as of 04/12/2021.    Allergies (verified) Patient has no known allergies.   History: Past Medical History:  Diagnosis Date  . CAD (coronary artery disease)    05/07/2019 LHC showed nonobstructive CAD  . Diabetes (HCC)   . Frequent PVCs   . Heart  failure with reduced ejection fraction (HCC)    04/2019 EF 20-25%  . Hypertension   . Impotence of organic origin   . NICM (nonischemic cardiomyopathy) (HCC)    HFrEF in setting of NICM  . Pain in joint, shoulder region   . Personal history of colonic polyps   . Ventricular tachycardia (HCC)    04/2019 in setting of cardiac arrest   Past Surgical History:  Procedure Laterality Date  . CARDIAC CATHETERIZATION  11/17/2013  . COLONOSCOPY  2009  .  ICD IMPLANT N/A 05/08/2019   Procedure: ICD IMPLANT;  Surgeon: Hillis Range, MD;  Location: [facility];  Service: Cardiovascular;  Laterality: N/A;  . RIGHT/LEFT HEART CATH AND CORONARY ANGIOGRAPHY N/A 05/07/2019   Procedure: RIGHT/LEFT HEART CATH AND CORONARY ANGIOGRAPHY;  Surgeon: Yvonne Kendall, MD;  Location: [facility] INVASIVE CV LAB;  Service: Cardiovascular;  Laterality: N/A;   Family History  Problem Relation Age of Onset  . Colon cancer Father        1970's  . Hypertension Mother   . Diabetes Mother    Social History   Socioeconomic History  . Marital status: Married    Spouse name: Not on file  . Number of children: Not on file  . Years of education: Not on file  . Highest education level: Not on file  Occupational History  . Occupation: [employer]  . Occupation: retired  Tobacco Use  . Smoking status: Never Smoker  . Smokeless tobacco: Never Used  Vaping Use  . Vaping Use: Never used  Substance and Sexual Activity  . Alcohol use: Yes    Comment: occassional  . Drug use: No  . Sexual activity: Not on file  Other Topics Concern  . Not on file  Social History Narrative  . Not on file   Social Determinants of Health   Financial Resource Strain: Low Risk   . Difficulty of Paying Living Expenses: Not hard at all  Food Insecurity: No Food Insecurity  . Worried About Programme researcher, broadcasting/film/video in the Last Year: Never true  . Ran Out of Food in the Last Year: Never true  Transportation Needs: No Transportation Needs  . Lack of Transportation (Medical): No  . Lack of Transportation (Non-Medical): No  Physical Activity: Insufficiently Active  . Days of Exercise per Week: 4 days  . Minutes of Exercise per Session: 30 min  Stress: No Stress Concern Present  . Feeling of Stress : Not at all  Social Connections: Not on file    Tobacco Counseling Counseling given: Not Answered   Clinical Intake:  Pre-visit preparation completed: Yes  Pain : No/denies  pain     Nutritional Status: BMI of 19-24  Normal Nutritional Risks: None Diabetes: Yes  How often do you need to have someone help you when you read instructions, pamphlets, or other written materials from your doctor or pharmacy?: 1 - Never What is the last grade level you completed in school?: some college  Diabetic? Yes Nutrition Risk Assessment:  Has the patient had any N/V/D within the last 2 months?  No  Does the patient have any non-healing wounds?  No  Has the patient had any unintentional weight loss or weight gain?  No   Diabetes:  Is the patient diabetic?  Yes  If diabetic, was a CBG obtained today?  No  Did the patient bring in their glucometer from home?  No  How often do you monitor your CBG's? daily.  Financial Strains and Diabetes Management:  Are you having any financial strains with the device, your supplies or your medication? No .  Does the patient want to be seen by Chronic Care Management for management of their diabetes?  No  Would the patient like to be referred to a Nutritionist or for Diabetic Management?  No   Diabetic Exams:  Diabetic Eye Exam: Completed 08/18/2020 Diabetic Foot Exam: Completed 08/19/2020   Interpreter Needed?: No  Information entered by :: NAllen LPN   Activities of Daily Living In your present state of health, do you have any difficulty performing the following activities: 04/12/2021 08/19/2020  Hearing? N N  Vision? Y N  Comment glaucoma -  Difficulty concentrating or making decisions? N N  Walking or climbing stairs? N N  Dressing or bathing? N N  Doing errands, shopping? N N  Preparing Food and eating ? N -  Using the Toilet? N -  In the past six months, have you accidently leaked urine? N -  Do you have problems with loss of bowel control? N -  Managing your Medications? N -  Managing your Finances? N -  Housekeeping or managing your Housekeeping? N -  Some recent data might be hidden    Patient Care  Team: Smitty Cords, DO as PCP - General (Family Medicine) Hillis Range, MD as PCP - Cardiology (Cardiology) Antonieta Iba, MD as Consulting Physician (Cardiology)  Indicate any recent Medical Services you may have received from other than [facility] providers in the past year (date may be approximate).     Assessment:   This is a routine wellness examination for Russell [name].  Hearing/Vision screen  Hearing Screening   125Hz  250Hz  500Hz  1000Hz  2000Hz  3000Hz  4000Hz  6000Hz  8000Hz   Right ear:           Left ear:           Vision Screening Comments: Regular eye exams, [facility]  Dietary issues and exercise activities discussed: Current Exercise Habits: Home exercise routine, Type of exercise: walking, Time (Minutes): 30, Frequency (Times/Week): 4, Weekly Exercise (Minutes/Week): 120  Goals    . Patient Stated     04/12/2021, live as long as possible      Depression Screen PHQ 2/9 Scores 04/12/2021 08/19/2020 08/22/2019 05/22/2019 12/30/2018 06/24/2018 08/22/2017  PHQ - 2 Score 0 0 0 0 0 0 0    Fall Risk Fall Risk  04/12/2021 02/18/2021 08/19/2020 08/22/2019 05/22/2019  Falls in the past year? 0 0 0 0 0  Number falls in past yr: - 0 0 - -  Injury with Fall? - 0 0 - -  Risk for fall due to : Medication side effect - - - -  Follow up Falls evaluation completed;Education provided;Falls prevention discussed Falls evaluation completed Falls evaluation completed Falls evaluation completed Falls evaluation completed    FALL RISK PREVENTION PERTAINING TO THE HOME:  Any stairs in or around the home? Yes  If so, are there any without handrails? No  Home free of loose throw rugs in walkways, pet beds, electrical cords, etc? Yes  Adequate lighting in your home to reduce risk of falls? Yes   ASSISTIVE DEVICES UTILIZED TO PREVENT FALLS:  Life alert? No  Use of a cane, walker or w/c? No  Grab bars in the bathroom? No  Shower chair or bench in shower? No  Elevated toilet seat or a  handicapped toilet? Yes   TIMED UP AND GO:  Was the test performed?  No .    Cognitive Function:     6CIT Screen 04/12/2021  What Year? 0 points  What month? 0 points  What time? 0 points  Count back from 20 0 points  Months in reverse 0 points  Repeat phrase 4 points  Total Score 4    Immunizations Immunization History  Administered Date(s) Administered  . Fluad Quad(high Dose 65+) 10/20/2020  . Influenza-Unspecified 09/17/2018  . PFIZER(Purple Top)SARS-COV-2 Vaccination 02/04/2020, 02/25/2020, 10/12/2020  . Pneumococcal Conjugate-13 08/22/2019    TDAP status: Up to date  Flu Vaccine status: Up to date  Pneumococcal vaccine status: Due, Education has been provided regarding the importance of this vaccine. Advised may receive this vaccine at local pharmacy or Health Dept. Aware to provide a copy of the vaccination record if obtained from local pharmacy or Health Dept. Verbalized acceptance and understanding.  Covid-19 vaccine status: Completed vaccines  Qualifies for Shingles Vaccine? Yes   Zostavax completed No   Shingrix Completed?: No.    Education has been provided regarding the importance of this vaccine. Patient has been advised to call insurance company to determine out of pocket expense if they have not yet received this vaccine. Advised may also receive vaccine at local pharmacy or Health Dept. Verbalized acceptance and understanding.  Screening Tests Health Maintenance  Topic Date Due  . PNA vac Low Risk Adult (2 of 2 - PPSV23) 08/21/2020  . INFLUENZA VACCINE  07/25/2021  . HEMOGLOBIN A1C  08/18/2021  . OPHTHALMOLOGY EXAM  08/18/2021  . FOOT EXAM  08/19/2021  . Fecal DNA (Cologuard)  09/02/2023  . TETANUS/TDAP  06/23/2027  . COVID-19 Vaccine  Completed  . Hepatitis C Screening  Completed  . HPV VACCINES  Aged Out    Health Maintenance  Health Maintenance Due  Topic Date Due  . PNA vac Low Risk Adult (2 of 2 - PPSV23) 08/21/2020    Colorectal  cancer screening: Type of screening: Cologuard. Completed 09/03/2020. Repeat every 3 years  Lung Cancer Screening: (Low Dose CT Chest recommended if Age 38-80 years, 30 pack-year currently smoking OR have quit w/in 15years.) does not qualify.   Lung Cancer Screening Referral: no  Additional Screening:  Hepatitis C Screening: does qualify; Completed 06/18/2018  Vision Screening: Recommended annual ophthalmology exams for early detection of glaucoma and other disorders of the eye. Is the patient up to date with their annual eye exam?  Yes  Who is the provider or what is the name of the office in which the patient attends annual eye exams? [facility] If pt is not established with a provider, would they like to be referred to a provider to establish care? No .   Dental Screening: Recommended annual dental exams for proper oral hygiene  Community Resource Referral / Chronic Care Management: CRR required this visit?  No   CCM required this visit?  No      Plan:     I have personally reviewed and noted the following in the patient's chart:   . Medical and social history . Use of alcohol, tobacco or illicit drugs  . Current medications and supplements . Functional ability and status . Nutritional status . Physical activity . Advanced directives . List of other physicians . Hospitalizations, surgeries, and ER visits in previous 12 months . Vitals . Screenings to include cognitive, depression, and falls . Referrals and appointments  In addition, I have reviewed and discussed with patient certain preventive protocols, quality metrics, and best practice recommendations. A  written personalized care plan for preventive services as well as general preventive health recommendations were provided to patient.     Barb Merino, LPN   [id number]   Nurse Notes:

## 2021-04-12 NOTE — Patient Instructions (Signed)
[name] , Thank you for taking time to come for your Medicare Wellness Visit. I appreciate your ongoing commitment to your health goals. Please review the following plan we discussed and let me know if I can assist you in the future.   Screening recommendations/referrals: Colonoscopy: cologuard  09/03/2020, due 09/04/2023 Recommended yearly ophthalmology/optometry visit for glaucoma screening and checkup Recommended yearly dental visit for hygiene and checkup  Vaccinations: Influenza vaccine: completed 10/20/2020, due 07/25/2021 Pneumococcal vaccine: due Tdap vaccine: completed 06/22/2017, due 06/23/2027 Shingles vaccine: discussed   Covid-19:  10/12/2020, 02/25/2020, 02/04/2020  Advanced directives: Please bring a copy of your POA (Power of Attorney) and/or Living Will to your next appointment.   Conditions/risks identified: none  Next appointment: Follow up in one year for your annual wellness visit.   Preventive Care 68 Years and Older, Male Preventive care refers to lifestyle choices and visits with your health care provider that can promote health and wellness. What does preventive care include?  A yearly physical exam. This is also called an annual well check.  Dental exams once or twice a year.  Routine eye exams. Ask your health care provider how often you should have your eyes checked.  Personal lifestyle choices, including:  Daily care of your teeth and gums.  Regular physical activity.  Eating a healthy diet.  Avoiding tobacco and drug use.  Limiting alcohol use.  Practicing safe sex.  Taking low doses of aspirin every day.  Taking vitamin and mineral supplements as recommended by your health care provider. What happens during an annual well check? The services and screenings done by your health care provider during your annual well check will depend on your age, overall health, lifestyle risk factors, and family history of disease. Counseling  Your health care  provider may ask you questions about your:  Alcohol use.  Tobacco use.  Drug use.  Emotional well-being.  Home and relationship well-being.  Sexual activity.  Eating habits.  History of falls.  Memory and ability to understand (cognition).  Work and work Astronomer. Screening  You may have the following tests or measurements:  Height, weight, and BMI.  Blood pressure.  Lipid and cholesterol levels. These may be checked every 5 years, or more frequently if you are over 68 years old.  Skin check.  Lung cancer screening. You may have this screening every year starting at age 31 if you have a 30-pack-year history of smoking and currently smoke or have quit within the past 15 years.  Fecal occult blood test (FOBT) of the stool. You may have this test every year starting at age 68.  Flexible sigmoidoscopy or colonoscopy. You may have a sigmoidoscopy every 5 years or a colonoscopy every 10 years starting at age 57.  Prostate cancer screening. Recommendations will vary depending on your family history and other risks.  Hepatitis C blood test.  Hepatitis B blood test.  Sexually transmitted disease (STD) testing.  Diabetes screening. This is done by checking your blood sugar (glucose) after you have not eaten for a while (fasting). You may have this done every 1-3 years.  Abdominal aortic aneurysm (AAA) screening. You may need this if you are a current or former smoker.  Osteoporosis. You may be screened starting at age 56 if you are at high risk. Talk with your health care provider about your test results, treatment options, and if necessary, the need for more tests. Vaccines  Your health care provider may recommend certain vaccines, such as:  Influenza  vaccine. This is recommended every year.  Tetanus, diphtheria, and acellular pertussis (Tdap, Td) vaccine. You may need a Td booster every 10 years.  Zoster vaccine. You may need this after age 46.  Pneumococcal  13-valent conjugate (PCV13) vaccine. One dose is recommended after age 22.  Pneumococcal polysaccharide (PPSV23) vaccine. One dose is recommended after age 2. Talk to your health care provider about which screenings and vaccines you need and how often you need them. This information is not intended to replace advice given to you by your health care provider. Make sure you discuss any questions you have with your health care provider. Document Released: 01/07/2016 Document Revised: 08/30/2016 Document Reviewed: 10/12/2015 Elsevier Interactive Patient Education  2017 [place] Prevention in the Home Falls can cause injuries. They can happen to people of all ages. There are many things you can do to make your home safe and to help prevent falls. What can I do on the outside of my home?  Regularly fix the edges of walkways and driveways and fix any cracks.  Remove anything that might make you trip as you walk through a door, such as a raised step or threshold.  Trim any bushes or trees on the path to your home.  Use bright outdoor lighting.  Clear any walking paths of anything that might make someone trip, such as rocks or tools.  Regularly check to see if handrails are loose or broken. Make sure that both sides of any steps have handrails.  Any raised decks and porches should have guardrails on the edges.  Have any leaves, snow, or ice cleared regularly.  Use sand or salt on walking paths during winter.  Clean up any spills in your garage right away. This includes oil or grease spills. What can I do in the bathroom?  Use night lights.  Install grab bars by the toilet and in the tub and shower. Do not use towel bars as grab bars.  Use non-skid mats or decals in the tub or shower.  If you need to sit down in the shower, use a plastic, non-slip stool.  Keep the floor dry. Clean up any water that spills on the floor as soon as it happens.  Remove soap buildup in the  tub or shower regularly.  Attach bath mats securely with double-sided non-slip rug tape.  Do not have throw rugs and other things on the floor that can make you trip. What can I do in the bedroom?  Use night lights.  Make sure that you have a light by your bed that is easy to reach.  Do not use any sheets or blankets that are too big for your bed. They should not hang down onto the floor.  Have a firm chair that has side arms. You can use this for support while you get dressed.  Do not have throw rugs and other things on the floor that can make you trip. What can I do in the kitchen?  Clean up any spills right away.  Avoid walking on wet floors.  Keep items that you use a lot in easy-to-reach places.  If you need to reach something above you, use a strong step stool that has a grab bar.  Keep electrical cords out of the way.  Do not use floor polish or wax that makes floors slippery. If you must use wax, use non-skid floor wax.  Do not have throw rugs and other things on the floor that can  make you trip. What can I do with my stairs?  Do not leave any items on the stairs.  Make sure that there are handrails on both sides of the stairs and use them. Fix handrails that are broken or loose. Make sure that handrails are as long as the stairways.  Check any carpeting to make sure that it is firmly attached to the stairs. Fix any carpet that is loose or worn.  Avoid having throw rugs at the top or bottom of the stairs. If you do have throw rugs, attach them to the floor with carpet tape.  Make sure that you have a light switch at the top of the stairs and the bottom of the stairs. If you do not have them, ask someone to add them for you. What else can I do to help prevent falls?  Wear shoes that:  Do not have high heels.  Have rubber bottoms.  Are comfortable and fit you well.  Are closed at the toe. Do not wear sandals.  If you use a stepladder:  Make sure that it  is fully opened. Do not climb a closed stepladder.  Make sure that both sides of the stepladder are locked into place.  Ask someone to hold it for you, if possible.  Clearly mark and make sure that you can see:  Any grab bars or handrails.  First and last steps.  Where the edge of each step is.  Use tools that help you move around (mobility aids) if they are needed. These include:  Canes.  Walkers.  Scooters.  Crutches.  Turn on the lights when you go into a dark area. Replace any light bulbs as soon as they burn out.  Set up your furniture so you have a clear path. Avoid moving your furniture around.  If any of your floors are uneven, fix them.  If there are any pets around you, be aware of where they are.  Review your medicines with your doctor. Some medicines can make you feel dizzy. This can increase your chance of falling. Ask your doctor what other things that you can do to help prevent falls. This information is not intended to replace advice given to you by your health care provider. Make sure you discuss any questions you have with your health care provider. Document Released: 10/07/2009 Document Revised: 05/18/2016 Document Reviewed: 01/15/2015 Elsevier Interactive Patient Education  2017 [employer].

## 2021-04-13 ENCOUNTER — Telehealth: Admit: 2021-04-13 | Payer: PRIVATE HEALTH INSURANCE | Attending: Urology | Primary: Internal Medicine

## 2021-04-13 NOTE — Telephone Encounter (Signed)
This is Dr. Gollan's pt. °

## 2021-04-13 NOTE — Telephone Encounter
Spoke w/Rodarius Lindholm Confirmed date of 5/25 for HOLEP Will need PCP clearance - Doesn't have a PCP currently and will call me back to let me know when he has chosen one so I can forward the paperworkHe has preop instructions and will have cath change 5/10 for preop urine cultureHe [facility] - 1 month FU w/Jessica Cave, NP          Needs MRI prior to surgery - Currently scheduled for 6/5, please move up sooner

## 2021-04-14 ENCOUNTER — Other Ambulatory Visit: Payer: Self-pay

## 2021-04-14 MED ORDER — NITROGLYCERIN 0.4 MG SL SUBL: SUBLINGUAL_TABLET | SUBLINGUAL | 0 refills | Status: DC

## 2021-04-20 NOTE — Telephone Encounter
LVM advising patient MRI needs to be moved up prior to surgery. 1 mo post op scheduled with Shanda Bumps on 06/20/21 @ 10am. Please confirm date and time upon return call. Thanks.

## 2021-04-22 ENCOUNTER — Other Ambulatory Visit: Payer: Self-pay

## 2021-04-22 MED ORDER — NITROGLYCERIN 0.4 MG SL SUBL: SUBLINGUAL_TABLET | SUBLINGUAL | 0 refills | Status: DC

## 2021-05-03 ENCOUNTER — Encounter: Admit: 2021-05-03 | Payer: PRIVATE HEALTH INSURANCE | Primary: Internal Medicine

## 2021-05-03 ENCOUNTER — Ambulatory Visit: Admit: 2021-05-03 | Payer: MEDICARE | Attending: Urology | Primary: Internal Medicine

## 2021-05-03 DIAGNOSIS — I10 Essential (primary) hypertension: Secondary | ICD-10-CM

## 2021-05-03 DIAGNOSIS — Z01818 Encounter for other preprocedural examination: Secondary | ICD-10-CM

## 2021-05-03 DIAGNOSIS — N399 Disorder of urinary system, unspecified: Secondary | ICD-10-CM

## 2021-05-03 NOTE — Progress Notes
Patient presents for NV for routine catheter change/pre-op urine culture; scheduled for HOLEP 05/18/21 with Dr. Oliva Bustard.9 cc water removed from balloon and # 16 FR coude catheter removed intact without difficulty.  Patient prepped and under sterile conditions new # 16 FR coude catheter inserted without difficulty with return of clear yellow urine; sterile urine culture obtained.  Balloon inflated with 10 cc sterile water, leg bag connected, patient declined catheter secure device.  Patient provided with extra leg bag and instructions; will call prn questions/concerns.

## 2021-05-03 NOTE — Patient Instructions
Indwelling Urinary Catheter Care, Adult °An indwelling urinary catheter is a thin tube that is put into your bladder. The tube helps to drain pee (urine) out of your body. The tube goes in through your urethra. Your urethra is where pee comes out of your body. Your pee will come out through the catheter, then it will go into a bag (drainage bag). °Take good care of your catheter so it will work well. °How to wear your catheter and bag °Supplies needed °· Sticky tape (adhesive tape) or a leg strap. °· Alcohol wipe or soap and water (if you use tape). °· A clean towel (if you use tape). °· Large overnight bag. °· Smaller bag (leg bag). °Wearing your catheter °Attach your catheter to your leg with tape or a leg strap. °· Make sure the catheter is not pulled tight. °· If a leg strap gets wet, take it off and put on a dry strap. °· If you use tape to hold the bag on your leg: °1. Use an alcohol wipe or soap and water to wash your skin where the tape made it sticky before. °2. Use a clean towel to pat-dry that skin. °3. Use new tape to make the bag stay on your leg. °Wearing your bags °You should have been given a large overnight bag. °· You may wear the overnight bag in the day or night. °· Always have the overnight bag lower than your bladder.  Do not let the bag touch the floor. °· Before you go to sleep, put a clean plastic bag in a wastebasket. Then hang the overnight bag inside the wastebasket. °You should also have a smaller leg bag that fits under your clothes. °· Always wear the leg bag below your knee. °· Do not wear your leg bag at night. °How to care for your skin and catheter °Supplies needed °· A clean washcloth. °· Water and mild soap. °· A clean towel. °Caring for your skin and catheter °· Clean the skin around your catheter every day: °1. Wash your hands with soap and water. °2. Wet a clean washcloth in warm water and mild soap. °3. Clean the skin around your urethra. °§ If you are male: °§ Gently  spread the folds of skin around your vagina (labia). °§ With the washcloth in your other hand, wipe the inner side of your labia on each side. Wipe from front to back. °§ If you are male: °§ Pull back any skin that covers the end of your penis (foreskin). °§ With the washcloth in your other hand, wipe your penis in small circles. Start wiping at the tip of your penis, then move away from the catheter. °§ Move the foreskin back in place, if needed. °4. With your free hand, hold the catheter close to where it goes into your body. °§ Keep holding the catheter during cleaning so it does not get pulled out. °5. With the washcloth in your other hand, clean the catheter. °§ Only wipe downward on the catheter. °§ Do not wipe upward toward your body. Doing this may push germs into your urethra and cause infection. °6. Use a clean towel to pat-dry the catheter and the skin around it. Make sure to wipe off all soap. °7. Wash your hands with soap and water. °· Shower every day. Do not take baths. °· Do not use cream, ointment, or lotion on the area where the catheter goes into your body, unless your doctor tells you to. °· Do not   use powders, sprays, or lotions on your genital area. °· Check your skin around the catheter every day for signs of infection. Check for: °? Redness, swelling, or pain. °? Fluid or blood. °? Warmth. °? Pus or a bad smell.  °  °  °How to empty the bag °Supplies needed °· Rubbing alcohol. °· Gauze pad or cotton ball. °· Tape or a leg strap. °Emptying the bag °Pour the pee out of your bag when it is ?-½ full, or at least 2-3 times a day. Do this for your overnight bag and your leg bag. °1. Wash your hands with soap and water. °2. Separate (detach) the bag from your leg. °3. Hold the bag over the toilet or a clean pail. Keep the bag lower than your hips and bladder. This is so the pee (urine) does not go back into the tube. °4. Open the pour spout. It is at the bottom of the bag. °5. Empty the pee into the  toilet or pail. Do not let the pour spout touch any surface. °6. Put rubbing alcohol on a gauze pad or cotton ball. °7. Use the gauze pad or cotton ball to clean the pour spout. °8. Close the pour spout. °9. Attach the bag to your leg with tape or a leg strap. °10. Wash your hands with soap and water. °Follow instructions for cleaning the drainage bag: °· From the product maker. °· As told by your doctor. °How to change the bag °Supplies needed °· Alcohol wipes. °· A clean bag. °· Tape or a leg strap. °Changing the bag °Replace your bag when it starts to leak, smell bad, or look dirty. °1. Wash your hands with soap and water. °2. Separate the dirty bag from your leg. °3. Pinch the catheter with your fingers so that pee does not spill out. °4. Separate the catheter tube from the bag tube where these tubes connect (at the connection valve). Do not let the tubes touch any surface. °5. Clean the end of the catheter tube with an alcohol wipe. Use a different alcohol wipe to clean the end of the bag tube. °6. Connect the catheter tube to the tube of the clean bag. °7. Attach the clean bag to your leg with tape or a leg strap. Do not make the bag tight on your leg. °8. Wash your hands with soap and water. °General rules °· Never pull on your catheter. Never try to take it out. Doing that can hurt you. °· Always wash your hands before and after you touch your catheter or bag. Use a mild, fragrance-free soap. If you do not have soap and water, use hand sanitizer. °· Always make sure there are no twists or bends (kinks) in the catheter tube. °· Always make sure there are no leaks in the catheter or bag. °· Drink enough fluid to keep your pee pale yellow. °· Do not take baths, swim, or use a hot tub. °· If you are male, wipe from front to back after you poop (have a bowel movement).   °Contact a doctor if: °· Your pee is cloudy. °· Your pee smells worse than usual. °· Your catheter gets clogged. °· Your catheter  leaks. °· Your bladder feels full. °Get help right away if: °· You have redness, swelling, or pain where the catheter goes into your body. °· You have fluid, blood, pus, or a bad smell coming from the area where the catheter goes into your body. °· Your skin feels   warm where the catheter goes into your body. °· You have a fever. °· You have pain in your: °? Belly (abdomen). °? Legs. °? Lower back. °? Bladder. °· You see blood in the catheter. °· Your pee is pink or red. °· You feel sick to your stomach (nauseous). °· You throw up (vomit). °· You have chills. °· Your pee is not draining into the bag. °· Your catheter gets pulled out. °Summary °· An indwelling urinary catheter is a thin tube that is placed into the bladder to help drain pee (urine) out of the body. °· The catheter is placed into the part of the body that drains pee from the bladder (urethra). °· Taking good care of your catheter will keep it working properly and help prevent problems. °· Always wash your hands before and after touching your catheter or bag. °· Never pull on your catheter or try to take it out. °This information is not intended to replace advice given to you by your health care provider. Make sure you discuss any questions you have with your health care provider. °Document Revised: 04/04/2019 Document Reviewed: 07/27/2017 °Elsevier Patient Education © 2021 [employer] ° °

## 2021-05-04 ENCOUNTER — Inpatient Hospital Stay: Admit: 2021-05-04 | Discharge: 2021-05-04 | Payer: MEDICARE | Primary: Internal Medicine

## 2021-05-04 DIAGNOSIS — R339 Retention of urine, unspecified: Secondary | ICD-10-CM

## 2021-05-04 DIAGNOSIS — N189 Chronic kidney disease, unspecified: Secondary | ICD-10-CM

## 2021-05-04 DIAGNOSIS — N401 Enlarged prostate with lower urinary tract symptoms: Secondary | ICD-10-CM

## 2021-05-06 ENCOUNTER — Encounter: Admit: 2021-05-06 | Payer: PRIVATE HEALTH INSURANCE | Attending: Family | Primary: Internal Medicine

## 2021-05-06 ENCOUNTER — Telehealth: Admit: 2021-05-06 | Payer: PRIVATE HEALTH INSURANCE | Attending: Urology | Primary: Internal Medicine

## 2021-05-06 DIAGNOSIS — Z01818 Encounter for other preprocedural examination: Secondary | ICD-10-CM

## 2021-05-06 LAB — URINE CULTURE
BKR URINE CULTURE, ROUTINE: >=100000 — AB
BKR URINE CULTURE, ROUTINE: >=100000 — AB

## 2021-05-06 MED ORDER — SULFAMETHOXAZOLE 800 MG-TRIMETHOPRIM 160 MG TABLET
800-160 mg | ORAL_TABLET | Freq: Two times a day (BID) | ORAL | 1 refills | Status: AC
Start: 2021-05-06 — End: ?

## 2021-05-06 NOTE — Telephone Encounter
-----   Message from Ceasar Lund, [place] sent at 05/06/2021  7:54 AM EDT -----Good Morning Dr. Oliva Bustard!  Patient preop scheduled for 05/18/21.----- Message -----From: Lab, Background UserSent: 05/04/2021   5:19 PM EDTTo: Wendall Papa Abnl Urine Culture Result Pool

## 2021-05-06 NOTE — Telephone Encounter
Urine cx growing morganella and serattiaPre op for HOLEP 5/25/22Start bactrim ds bid x 7 days starting 5/22/22He is supposed to have MRI pelvis prior to [facility]

## 2021-05-06 NOTE — Telephone Encounter
Called and left detailed message on personal voicemail box (pt's first and last name listed) relaying UC result and treatment plan to start Bactrim DS BID x7 days starting on 05/15/21. Informed him that [facility] will call to arrange another MRI appointment before surgery. Provided callback number if he has any questions.

## 2021-05-09 ENCOUNTER — Ambulatory Visit (INDEPENDENT_AMBULATORY_CARE_PROVIDER_SITE_OTHER): Payer: Medicare HMO

## 2021-05-09 DIAGNOSIS — I428 Other cardiomyopathies: Secondary | ICD-10-CM

## 2021-05-10 ENCOUNTER — Telehealth: Admit: 2021-05-10 | Payer: PRIVATE HEALTH INSURANCE | Attending: Urology | Primary: Internal Medicine

## 2021-05-10 LAB — CUP PACEART REMOTE DEVICE CHECK
Battery Remaining Longevity: 123 mo
Battery Voltage: 3.02 V
Brady Statistic RV Percent Paced: 0.13 %
Date Time Interrogation Session: 20220516012203
HighPow Impedance: 53 Ohm
Implantable Lead Implant Date: 20200514
Implantable Lead Location: 753860
Implantable Lead Model: 6935
Implantable Pulse Generator Implant Date: 20200514
Lead Channel Impedance Value: 342 Ohm
Lead Channel Impedance Value: 437 Ohm
Lead Channel Pacing Threshold Amplitude: 0.75 V
Lead Channel Pacing Threshold Pulse Width: 0.4 ms
Lead Channel Sensing Intrinsic Amplitude: 9.625 mV
Lead Channel Sensing Intrinsic Amplitude: 9.625 mV
Lead Channel Setting Pacing Amplitude: 2 V
Lead Channel Setting Pacing Pulse Width: 0.4 ms
Lead Channel Setting Sensing Sensitivity: 0.3 mV

## 2021-05-10 NOTE — Telephone Encounter
Marc Evans has a HOLEP scheduled for 5/25 Had a positive urine cx done on 5/10He was told not to start the abx until  5/22He is symptomatic with bladder pressure and urgencywants to start taking the abx todayWill route to the providerHe had no additional questions

## 2021-05-11 NOTE — Telephone Encounter
Contacted Marc Evans who stated that he receive [name] message and started taking the antibiotic last night. He has an appointment to see his cardiologist tomorrow and his MRI on Friday. Marc Evans will call the office if he has any other question/ issues/ concerns.l

## 2021-05-13 ENCOUNTER — Encounter: Admit: 2021-05-13 | Payer: PRIVATE HEALTH INSURANCE | Attending: Urology | Primary: Internal Medicine

## 2021-05-13 ENCOUNTER — Inpatient Hospital Stay: Admit: 2021-05-13 | Discharge: 2021-05-13 | Payer: MEDICARE | Primary: Internal Medicine

## 2021-05-13 DIAGNOSIS — R339 Retention of urine, unspecified: Secondary | ICD-10-CM

## 2021-05-13 DIAGNOSIS — N401 Enlarged prostate with lower urinary tract symptoms: Secondary | ICD-10-CM

## 2021-05-13 DIAGNOSIS — N138 Other obstructive and reflux uropathy: Secondary | ICD-10-CM

## 2021-05-13 DIAGNOSIS — I10 Essential (primary) hypertension: Secondary | ICD-10-CM

## 2021-05-13 DIAGNOSIS — N189 Chronic kidney disease, unspecified: Secondary | ICD-10-CM

## 2021-05-13 MED ORDER — GADOTERATE MEGLUMINE 0.5 MMOL/ML (376.9 MG/ML) INTRAVENOUS SOLUTION
0.5 mmol/mL (376.9 mg/mL) | Freq: Once | INTRAVENOUS | Status: CP | PRN
Start: 2021-05-13 — End: ?
  Administered 2021-05-13: 16:00:00 0.5 mL via INTRAVENOUS

## 2021-05-13 MED ORDER — TORSEMIDE 20 MG TABLET
20 mg | Freq: Every day | ORAL | Status: AC
Start: 2021-05-13 — End: ?

## 2021-05-16 ENCOUNTER — Telehealth: Admit: 2021-05-16 | Payer: PRIVATE HEALTH INSURANCE | Attending: Urology | Primary: Internal Medicine

## 2021-05-16 ENCOUNTER — Inpatient Hospital Stay: Admit: 2021-05-16 | Discharge: 2021-05-16 | Payer: MEDICARE | Primary: Internal Medicine

## 2021-05-16 DIAGNOSIS — Z01812 Encounter for preprocedural laboratory examination: Secondary | ICD-10-CM

## 2021-05-16 DIAGNOSIS — Z20822 Contact with and (suspected) exposure to covid-19: Secondary | ICD-10-CM

## 2021-05-16 DIAGNOSIS — Z01818 Encounter for other preprocedural examination: Secondary | ICD-10-CM

## 2021-05-16 LAB — COVID-19 CLEARANCE OR FOR PLACEMENT ONLY: BKR SARS-COV-2 RNA (COVID-19) (YH): NEGATIVE

## 2021-05-16 MED ORDER — SULFAMETHOXAZOLE 800 MG-TRIMETHOPRIM 160 MG TABLET
800-160 mg | ORAL_TABLET | Freq: Two times a day (BID) | ORAL | 1 refills | Status: AC
Start: 2021-05-16 — End: ?

## 2021-05-16 NOTE — Telephone Encounter
Spoke to patientMRI pelvis reveals 222 cubic centimeters prostate, PIRADS 3 lesionWill proceed with HOLEP, send tissue to pathologyHe is taking bactrim ds bid for UTI- started treatment early because UTI symptoms- will extend treatment x 5 days

## 2021-05-16 NOTE — Telephone Encounter
Patient called to return Dr. Oliva Bustard phone call. Patient is available to talk now. Thank you

## 2021-05-17 ENCOUNTER — Ambulatory Visit: Admit: 2021-05-17 | Payer: MEDICARE | Attending: Family | Primary: Internal Medicine

## 2021-05-17 DIAGNOSIS — N401 Enlarged prostate with lower urinary tract symptoms: Secondary | ICD-10-CM

## 2021-05-18 ENCOUNTER — Ambulatory Visit: Admit: 2021-05-18 | Payer: MEDICARE | Attending: Family | Primary: Internal Medicine

## 2021-05-18 ENCOUNTER — Inpatient Hospital Stay: Admit: 2021-05-18 | Discharge: 2021-05-19 | Payer: MEDICARE | Attending: Urology | Primary: Internal Medicine

## 2021-05-18 ENCOUNTER — Encounter: Admit: 2021-05-18 | Payer: PRIVATE HEALTH INSURANCE | Attending: Urology | Primary: Internal Medicine

## 2021-05-18 DIAGNOSIS — N138 Other obstructive and reflux uropathy: Secondary | ICD-10-CM

## 2021-05-18 DIAGNOSIS — I10 Essential (primary) hypertension: Secondary | ICD-10-CM

## 2021-05-18 DIAGNOSIS — N401 Enlarged prostate with lower urinary tract symptoms: Secondary | ICD-10-CM

## 2021-05-18 MED ORDER — SULFAMETHOXAZOLE 800 MG-TRIMETHOPRIM 160 MG TABLET
800-160 mg | Freq: Two times a day (BID) | ORAL | Status: DC
Start: 2021-05-18 — End: 2021-05-19
  Administered 2021-05-19 (×2): 800-160 mg via ORAL

## 2021-05-18 MED ORDER — AMPICILLIN 500 MG SOLUTION FOR INJECTION
500 mg | INTRAVENOUS | Status: DC | PRN
Start: 2021-05-18 — End: 2021-05-18
  Administered 2021-05-18: 19:00:00 500 mg via INTRAVENOUS

## 2021-05-18 MED ORDER — ONDANSETRON 4 MG DISINTEGRATING TABLET
4 mg | Freq: Four times a day (QID) | ORAL | Status: DC | PRN
Start: 2021-05-18 — End: 2021-05-19

## 2021-05-18 MED ORDER — LACTATED RINGERS INTRAVENOUS SOLUTION
INTRAVENOUS | Status: DC | PRN
Start: 2021-05-18 — End: 2021-05-18
  Administered 2021-05-18 (×2): via INTRAVENOUS

## 2021-05-18 MED ORDER — FENTANYL (PF) 50 MCG/ML INJECTION SOLUTION
50 mcg/mL | Status: CP
Start: 2021-05-18 — End: ?

## 2021-05-18 MED ORDER — SODIUM CHLORIDE 0.9 % (FLUSH) INJECTION SYRINGE
0.9 % | Freq: Three times a day (TID) | INTRAVENOUS | Status: DC
Start: 2021-05-18 — End: 2021-05-18

## 2021-05-18 MED ORDER — ONDANSETRON HCL (PF) 4 MG/2 ML INJECTION SOLUTION
42 mg/2 mL | INTRAVENOUS | Status: DC | PRN
Start: 2021-05-18 — End: 2021-05-18

## 2021-05-18 MED ORDER — CHLORHEXIDINE GLUCONATE 0.12 % MOUTHWASH
0.12 % | Freq: Once | OROMUCOSAL | Status: CP
Start: 2021-05-18 — End: ?
  Administered 2021-05-18: 16:00:00 0.12 mL via OROMUCOSAL

## 2021-05-18 MED ORDER — SUGAMMADEX 100 MG/ML INTRAVENOUS SOLUTION
100 mg/mL | INTRAVENOUS | Status: DC | PRN
Start: 2021-05-18 — End: 2021-05-18
  Administered 2021-05-18: 21:00:00 100 mg/mL via INTRAVENOUS

## 2021-05-18 MED ORDER — CHLORHEXIDINE GLUCONATE 0.12 % MOUTHWASH
0.12 % | Status: DC
Start: 2021-05-18 — End: 2021-05-19

## 2021-05-18 MED ORDER — PROPOFOL 10 MG/ML INTRAVENOUS EMULSION
10 mg/mL | INTRAVENOUS | Status: DC | PRN
Start: 2021-05-18 — End: 2021-05-18
  Administered 2021-05-18: 19:00:00 10 mg/mL via INTRAVENOUS

## 2021-05-18 MED ORDER — ROCURONIUM 10 MG/ML INTRAVENOUS SOLUTION
10 mg/mL | INTRAVENOUS | Status: DC | PRN
Start: 2021-05-18 — End: 2021-05-18
  Administered 2021-05-18: 19:00:00 10 mg/mL via INTRAVENOUS

## 2021-05-18 MED ORDER — DEXAMETHASONE SODIUM PHOSPHATE 4 MG/ML INJECTION SOLUTION
4 mg/mL | INTRAVENOUS | Status: DC | PRN
Start: 2021-05-18 — End: 2021-05-18
  Administered 2021-05-18: 19:00:00 4 mg/mL via INTRAVENOUS

## 2021-05-18 MED ORDER — TOBRAMYCIN 40 MG/ML INJECTION SOLUTION
40 mg/mL | INTRAVENOUS | Status: DC | PRN
Start: 2021-05-18 — End: 2021-05-18
  Administered 2021-05-18: 19:00:00 40 mg/mL via INTRAVENOUS

## 2021-05-18 MED ORDER — ONDANSETRON HCL (PF) 4 MG/2 ML INJECTION SOLUTION
42 mg/2 mL | Freq: Four times a day (QID) | INTRAVENOUS | Status: DC | PRN
Start: 2021-05-18 — End: 2021-05-19

## 2021-05-18 MED ORDER — OXYCODONE-ACETAMINOPHEN 5 MG-325 MG TABLET
5-325 mg | Freq: Once | ORAL | Status: DC
Start: 2021-05-18 — End: 2021-05-18

## 2021-05-18 MED ORDER — SENNOSIDES 8.6 MG TABLET
8.6 mg | Freq: Two times a day (BID) | ORAL | Status: DC
Start: 2021-05-18 — End: 2021-05-19
  Administered 2021-05-19 (×2): 8.6 mg via ORAL

## 2021-05-18 MED ORDER — LACTATED RINGERS INTRAVENOUS SOLUTION
INTRAVENOUS | Status: DC
Start: 2021-05-18 — End: 2021-05-19
  Administered 2021-05-18: 23:00:00 1000.000 mL/h via INTRAVENOUS

## 2021-05-18 MED ORDER — LIDOCAINE HCL 2 % MUCOSAL JELLY
2 % | Freq: Once | TOPICAL | Status: DC | PRN
Start: 2021-05-18 — End: 2021-05-18

## 2021-05-18 MED ORDER — PHENAZOPYRIDINE 100 MG TABLET
100 mg | Freq: Once | ORAL | Status: DC | PRN
Start: 2021-05-18 — End: 2021-05-18

## 2021-05-18 MED ORDER — NALOXONE 0.4 MG/ML INJECTION SOLUTION
0.4 mg/mL | INTRAVENOUS | Status: DC | PRN
Start: 2021-05-18 — End: 2021-05-18

## 2021-05-18 MED ORDER — LIDOCAINE (PF) 20 MG/ML (2 %) INJECTION SOLUTION
202 mg/mL (2 %) | INTRAVENOUS | Status: DC | PRN
Start: 2021-05-18 — End: 2021-05-18
  Administered 2021-05-18: 19:00:00 20 mg/mL (2 %) via INTRAVENOUS

## 2021-05-18 MED ORDER — ACETAMINOPHEN 325 MG TABLET
325 mg | Freq: Four times a day (QID) | ORAL | Status: DC
Start: 2021-05-18 — End: 2021-05-19

## 2021-05-18 MED ORDER — PHENAZOPYRIDINE 100 MG TABLET
100 mg | Freq: Three times a day (TID) | ORAL | Status: DC | PRN
Start: 2021-05-18 — End: 2021-05-19

## 2021-05-18 MED ORDER — LIDOCAINE 2 % MUCOSAL JELLY IN APPLICATOR
2 % | Status: DC
Start: 2021-05-18 — End: 2021-05-19

## 2021-05-18 MED ORDER — LABETALOL 100 MG TABLET
100 mg | Freq: Two times a day (BID) | ORAL | Status: DC
Start: 2021-05-18 — End: 2021-05-19
  Administered 2021-05-19: 01:00:00 100 mg via ORAL

## 2021-05-18 MED ORDER — SODIUM CHLORIDE 0.9 % (FLUSH) INJECTION SYRINGE
0.9 % | INTRAVENOUS | Status: DC | PRN
Start: 2021-05-18 — End: 2021-05-18

## 2021-05-18 MED ORDER — HYDROMORPHONE 0.5 MG/0.5 ML INJECTION SYRINGE
0.50.5 mg/ mL | INTRAVENOUS | Status: DC | PRN
Start: 2021-05-18 — End: 2021-05-18

## 2021-05-18 MED ORDER — WATER FOR IRRIGATION, STERILE SOLUTION
Status: DC | PRN
Start: 2021-05-18 — End: 2021-05-18
  Administered 2021-05-18: 18:00:00

## 2021-05-18 MED ORDER — HEPARIN (PORCINE) 5,000 UNIT/ML INJECTION SOLUTION
5000 unit/mL | Freq: Three times a day (TID) | SUBCUTANEOUS | Status: DC
Start: 2021-05-18 — End: 2021-05-19
  Administered 2021-05-19 (×2): via SUBCUTANEOUS

## 2021-05-18 MED ORDER — FENTANYL (PF) 50 MCG/ML INJECTION SOLUTION
50 mcg/mL | INTRAVENOUS | Status: DC | PRN
Start: 2021-05-18 — End: 2021-05-18
  Administered 2021-05-18 (×3): 50 mcg/mL via INTRAVENOUS

## 2021-05-18 MED ORDER — SODIUM CHLORIDE 0.9 % IRRIGATION SOLUTION
0.9 % irrigation | Status: CP | PRN
Start: 2021-05-18 — End: ?
  Administered 2021-05-18 (×20): 0.9 % irrigation

## 2021-05-18 MED ORDER — LIDOCAINE 2 % MUCOSAL JELLY IN APPLICATOR
2 % | Status: DC | PRN
Start: 2021-05-18 — End: 2021-05-18
  Administered 2021-05-18: 18:00:00 2 % via URETHRAL

## 2021-05-18 MED ORDER — FENTANYL (PF) 50 MCG/ML INJECTION SOLUTION
50 mcg/mL | INTRAVENOUS | Status: DC | PRN
Start: 2021-05-18 — End: 2021-05-18

## 2021-05-18 MED ORDER — OXYCODONE IMMEDIATE RELEASE 5 MG TABLET
5 mg | ORAL | Status: DC | PRN
Start: 2021-05-18 — End: 2021-05-19

## 2021-05-18 NOTE — Plan of Care
Admission Note Nursing Marc Evans is a 70 y.o. male admitted with Post-op Holep. Patient arrived from PACU. Patient is  Orientation: oriented x 4 Vitals:  05/18/21 1730 05/18/21 1745 05/18/21 1800 05/18/21 1820 BP: (!) 146/72 (!) 147/75 (!) 153/74 (!) 145/79 Pulse: 61 (!) 57 (!) 55 (!) 56 Resp: (!) 12 17 (!) 8 16 Temp:   98 ?F (36.7 ?C) 97.7 ?F (36.5 ?C) TempSrc:   Axillary Oral SpO2: 95% 94% 98% 98% Weight:    106.6 kg Height:    5' 11 (1.803 m) Oxygen therapy Oxygen TherapySpO2: 98 %Device (Oxygen Therapy): room airO2 Flow (L/min): 2$Oxygen On/Off : Off (stops the charge episode)I have reviewed the patient's current medication orders.Current Facility-Administered Medications Medication Dose Route Frequency Provider Last Rate Last Admin  [START ON 05/19/2021] acetaminophen (TYLENOL) tablet 975 mg  975 mg Oral Q6H Siev, Peter Congo, MD      chlorhexidine gluconate (PERIDEX) 0.12 % solution           heparin (porcine) injection 5,000 Units  5,000 Units Subcutaneous Q8H Siev, Peter Congo, MD      labetaloL (NORMODYNE) tablet 100 mg  100 mg Oral Q12H Siev, Peter Congo, MD      lidocaine uro-jet (XYLOCAINE) 2 % jelly           senna (SENOKOT) tablet 8.6 mg  1 tablet Oral BID Siev, Peter Congo, MD      sulfamethoxazole-trimethoprim (BACTRIM DS;CO-TRIMOXAZOLE DS) 800-160 mg per tablet 1 tablet  1 tablet Oral BID Siev, Peter Congo, MD       lactated ringers 75 mL/hr (05/18/21 1921) ondansetron **OR** ondansetron (PF), oxyCODONE, phenazopyridineCurrent Facility-Administered Medications Medication Dose Route Frequency Last Rate  [START ON 05/19/2021] acetaminophen  975 mg Oral Q6H    chlorhexidine gluconate        heparin (porcine)  5,000 Units Subcutaneous Q8H    labetaloL  100 mg Oral Q12H    lactated ringers  75 mL/hr Intravenous Continuous 75 mL/hr (05/18/21 1921)  lidocaine uro-jet        ondansetron  4 mg Oral Q6H PRN    Or  ondansetron (PF)  4 mg IV Push Q6H PRN    oxyCODONE  5 mg Oral Q4H PRN    phenazopyridine  100 mg Oral Q8H PRN    senna  1 tablet Oral BID    sulfamethoxazole-trimethoprim  1 tablet Oral BID   .I have reviewed patient valuables Belongings charted in last 7 days: Valuable(s) : [employer]; Cell Phone (05/18/2021 11:36 AM) Comments:See flowsheets, patient education and plan of care for additional information. Plan of Care Overview/ Patient Status    Patient is cooperative well with care. Admission education provided.Remain on room air. IS teaching provided with successful teach back. CBI running slowly with clear output.Tolerate well with diet. Ivery Quale, RN

## 2021-05-18 NOTE — Other
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, N13.8]Procedure Done:   CYSTOSCOPY; HOLMIUM LASER ENUCLEATION OF PROSTATECPT(R) Code:  09811 - PR LASER ENUCLEATION PROSTATE W MORCELLATIONAny Significant Events Intra-Op: noneAbnormal Assessment in PACU: noneLevel of Consciousness: awake and alert Last Set of VS:  Vitals:  05/13/21 1305 05/18/21 1105 05/18/21 1131 05/18/21 1717 BP:  (!) 181/93  135/85 Pulse:  (!) 57  65 Resp:  18   Temp:  97.6 ?F (36.4 ?C)  97.7 ?F (36.5 ?C) TempSrc:  Temporal  Axillary SpO2:  97%  98% Weight: 106.6 kg  106.6 kg  Height: 6' (1.829 m)  5' 11 (1.803 m)  Device (Oxygen Therapy): nasal cannula O2 Flow (L/min): 2Baseline Neuro/developmental Status: WDLLabs Collected: N/ASpecial Needs of the Patient: CBI infusingAntibiotics: last dose given in OR: ampicillin 2g at 1447 and tobramycin at 1447IV Access: Periph IV-single(adult) 05/18/21 1158 metacarpal(dorsum of hand), left over-the-needle catheter system 20 gauge (Active) SiteCare/Dressing Status/Securement dressing dry and intact 05/18/21 1717 Date Dressing Applied/Changed 05/18/21 05/18/21 1717 Next Date Dressing Change 05/25/2021 05/18/21 1717 Lumen 1 Patency/Care flushed w/o difficulty;Patent 05/18/21 1717 Site Signs asymptomatic with no redness, no swelling, no drainage 05/18/21 1717 Phlebitis 0-->no symptoms 05/18/21 1717 Infiltration/Extravasation Assessment 0-->no symptoms 05/18/21 1717 Patient Education Instructed to keep IV site dry;Instructed to call nurse if site is painful,red,swollen, burning;Instructed to call nurse if fluid leaking from site 05/18/21 1717 Daily Review of Necessity ** completed 05/18/21 1717 IV Fluids: Pain Assessment: Number Scale (0-10) (Adult, OB) 05/18/21 1131 medial back-Pain Rating (0-10): Rest: 3 Number Scale Pain Rating 0: 0-->no pain     Body Position: independentHead of Bed (HOB) Positioning: HOB at 30 degrees   Time of Last Void or Time that Urinary Catheter was Removed Intra-Op: catheter in place Additional Info: Pt arrived to PACU alert, slightly drowsy, oriented. Denies pain/discomfort. NSR/SB high 50s- 60s, SBP 130s-140s, 2LNC CTA. Foley patent with CBI infusing clear urine. Per team will dc CBI later this evening and foley to be removed tomorrow morning. Contact RN (name and phone number): [facility] PACU

## 2021-05-18 NOTE — Anesthesia Post-Procedure Evaluation
Anesthesia Post-op NotePatient: Marc Hua GantProcedure(s):  Procedure(s) (LRB):CYSTOSCOPY; HOLMIUM LASER ENUCLEATION OF PROSTATE (N/A) Patient location: PACULast Vitals:  I have noted the vital signs as listed in the nursing notes.Mental status recovered: patient participates in evaluation: YesVital signs reviewed: YesRespiratory function stable:YesAirway is patent: YesCardiovascular function and hydration status stable: YesPain control satisfactory: YesNausea and vomiting control satisfactory:YesNo complications documented.

## 2021-05-18 NOTE — Brief Op Note
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 En blocSigns of infection present at the time of surgery at the operative site: None Blood and Blood Products: none                 Drains:  Urinary Catheter (Foley)Implants: * No implants in log * Specimens: ID Type Source Tests Collected by Time 1 : PROSTATE CHIPS Tissue Prostate PATHOLOGY [facility]) Pamella Pert, MD 05/18/2021  3:11 PM  Clinical Staging: NAEBL: Minimal       Post Operative Diagnosis: * No post-op diagnosis entered * Kelli Egolf H Azhane Eckart, MD5/25/20224:52 PM

## 2021-05-18 NOTE — Other
UROLOGY PRE-OPERATIVE H&PHPI:  Marc Evans is a 70 y.o. 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 He presents for Procedure(s) (LRB):CYSTOSCOPY; HOLMIUM LASER ENUCLEATION OF PROSTATE (N/A). Prostate volume 222cc on MRI prostateStarted on Bactrim DS for urinary tract infection He lives in his car. ?Urine Culture: Urine Culture, Routine (no units) Date Value 05/03/2021 >=100,000 CFU/mL Serratia marcescens (A) 05/03/2021 >=100,000 CFU/mL Morganella morganii (A)  Medical History: PMH PSH Past Medical History: Diagnosis Date ? High blood pressure  ? Hypertension   Past Surgical History: Procedure Laterality Date ? TONSILLECTOMY    as a child ? 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  Prior to Admission Medications No current facility-administered medications on file prior to encounter. Current Outpatient Medications on File Prior to Encounter Medication Sig Dispense Refill ? hydrALAZINE (APRESOLINE) 50 mg tablet Take 50 mg by mouth 3 (three) times daily.    ? labetaloL (NORMODYNE) 100 mg tablet TAKE ONE TABLET BY MOUTH EVERY 12 HOURS AROUND THE CLOCK   ? fish oil-omega-3 fatty acids 1,000 mg capsule Take 2 g by mouth daily.   ? oxybutynin (DITROPAN) 5 mg immediate release tablet Take 5 mg by mouth.   ? Saccharomyces boulardii (FLORASTOR) 250 mg capsule Take 250 mg by mouth.   ? tamsulosin (FLOMAX) 0.4 mg 24 hr capsule Take 0.4 mg by mouth daily.   ? torsemide (DEMADEX) 20 mg tablet Take 20 mg by mouth in the morning.     Allergies No Known Allergies Physical Exam:  Vitals:  Temp:  [97.6 ?F (36.4 ?C)] 97.6 ?F (36.4 ?C)Pulse:  [57] 57Resp:  [18] 18BP: (181)/(93) 181/93SpO2:  [97 %] 97 %Device (Oxygen Therapy): room airPhysical Exam: Gen: well appearingCV: regular rate and rhythmPulm: clear to auscultation bilaterally  Labs: Labs:Lab Results Component Value Date/Time  WBC 6.1 04/15/2021 12:00 AM  HGB 12.6 (L) 04/15/2021 12:00 AM  HCT 37.2 (L) 04/15/2021 12:00 AM  PLT 207 04/15/2021 12:00 AM No results found for: INR, PTT Lab Results Component Value Date/Time  NA 138 04/15/2021 12:00 AM  K 4.8 04/15/2021 12:00 AM  CL 108 04/15/2021 12:00 AM  CO2 19 (L) 04/15/2021 12:00 AM  BUN 35 (H) 04/15/2021 12:00 AM  CREATININE 2.86 (H) 04/15/2021 12:00 AM Microbiology:Lab Results Component Value Date  LABURIN >=100,000 CFU/mL Serratia marcescens (A) 05/03/2021  LABURIN >=100,000 CFU/mL Morganella morganii (A) 05/03/2021 Surgical Pathology:No results found for: SURGICALCASE, SURGPROC, SURGCONSULT, SURGICAL, SURGICALPATH, CYTFINNDLASP  Assessment/ Plan: Marc Evans is a 70 y.o. male with a history of BPH and retention who presents for HoLEP. Proceed to OR for Procedure(s) (LRB):CYSTOSCOPY; HOLMIUM LASER ENUCLEATION OF PROSTATE (N/A)Signed:Chrystine Oiler, MD5/24/2022Electronically Signed by Pamella Pert, MD, May 18, 2021

## 2021-05-18 NOTE — Anesthesia Pre-Procedure Evaluation
This is a 70 y.o. male scheduled for CYSTOSCOPY; HOLMIUM LASER ENUCLEATION OF PROSTATE (N/A ).Review of Systems/ Medical HistoryPatient summary, nursing notes, EKG/Cardiac Studies , Labs, pre-procedure vitals, height, weight and NPO status reviewed.No previous anesthesia concernsAnesthesia Evaluation: Estimated body mass index is 32.78 kg/m? as calculated from the following:  Height as of this encounter: 5' 11 (1.803 m).  Weight as of this encounter: 106.6 kg. Cardiovascular:Patient has a history of: hypertension. Physical ExamCardiovascular:    Rhythm: regularHeart Sounds: S1 present and S2 present.Pulmonary:  Patient's breath sounds clear to auscultationAirway:  Mallampati: IITM distance: >3 FBNeck ROM: fullDental:  normal exam  Anesthesia PlanASA 2 The primary anesthesia plan is  general ETT. Perioperative Code Status confirmed: It is my understanding that the patient is currently designated as 'Full Code' and will remain so throughout the perioperative period.Anesthesia informed consent obtained. Consent obtained from: patientUse of blood products: consented  The post operative pain plan is per surgeon management.Plan discussed with CRNA.Anesthesiologist's Pre Op NoteI personally evaluated and examined the patient prior to the intra-operative phase of care.

## 2021-05-18 NOTE — Other
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 En bloc. COMPLICATIONS: NoneCONDITION AT END OF PROCEDURE: SatisfactoryPROCEDURE:The indications, risks and benefits, side effects (urinary retention, hematuria, UTI, urethral stricture stricture, retrograde ejaculation, very small chance of erectile dysfunction and urinary incontinence which is usually transient). Informed consent was obtained.Indications: BPH with bladder outflow obstructionThe patient taken to procedure room and placed in the dorsolithotomy position. The genital region was prepped and draped using the usual sterile technique.IV antibiotics were administered. A 26 fr Laser Resectoscope (Wolf) was used, saline irrigation was with a Thermedex irrigation unit with pressure at 60 cm water.  The prostate appeared enlarged with obstruction. Both ureteral orifices were normal in position with clear urine efflux bilaterally. There was no stone, tumor, diverticulum, or other pathology within the bladder. The bladder mucosa was normal with trabeculation.  A 550uM holmium laser fiber was used with laser settings of 2.0 J and 40Hz  cut MOSES and 2.0 J and 20Hz  coagulation long wave length. Laser enucleation was initiated with incisions at the 5 and 7:00 position relative to the verumontanum and carried clockwise and counter clockwise dividing the apex of the adenoma from the sphincter.  Mucosa proximal to the verumontanum was then divided and a posterior plane down to the capsule was developed in a retrograde manner elevating the posterior prostate working from a posterior to lateral toward anterior position. The scope was brought back to the apex of the prostate and the apex was released completely by dividing the anterior portion of the mucosa separating the sphincter from the anterior apex of the prostate.  The dissection was then carried anterior to the prostate in a retrograde manner up to the bladder neck.  The bladder neck was entered anterior to the adenoma and the contour of the bladder neck was followed as the lateral lobes were dropped posteriorly.  Dissection was carried circumferentially around the adenoma freeing all attachment points and using blunt leverage to flip the adenoma into the bladder or any further attachments were divided sharply taking care to avoid the ureteral orifices.The laser energy was used to control any bleeding vessels in the prostatic fossae prior to placing the [facility] morcellator through a nephroscope. The Piranhna morcellator was used to morcellate and remove all tissue. Prostate fossae and bladder were again inspected and no residual tissue was seen. A 22 fr 3 way catheter was placed with 60cc in balloon, continuous irrigation with normal saline was continued and the patient was awoken from anesthesia and returned to recovery with clear urine.The patient tolerated procedure well, without significant blood loss or complication. The patient entered recovery in stable condition. SPECIMENS: prostate chipsSDS = 1I, Dr Oliva Bustard, was present for this entire procedure and performed all critical portions of the surgery. Electronically Signed by Pamella Pert, MD, May 18, 2021

## 2021-05-18 NOTE — Other
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

## 2021-05-18 NOTE — Other
Post Anesthesia Transfer of Care NotePatient: Marc Evans) Performed: Procedure(s) (LRB):CYSTOSCOPY; HOLMIUM LASER ENUCLEATION OF PROSTATE (N/A) Patient location: PACU Last Vitals: Vitals Value Taken Time BP 136/85 05/18/21 1718 Temp 36.5 ?C 05/18/21 1717 Pulse 63 05/18/21 1724 Resp 12 05/18/21 1724 SpO2 98 % 05/18/21 1724 Vitals shown include unvalidated device data.Level of consciousness: awakeTransport Vital Signs:  Stable since the last set of recorded intra-operative vital signsIntra-operative Complications: noneIntra-operative Intake & Output and Antibiotics as per Anesthesia record and discussed with the RN.

## 2021-05-19 DIAGNOSIS — N138 Other obstructive and reflux uropathy: Secondary | ICD-10-CM

## 2021-05-19 MED ORDER — PHENAZOPYRIDINE 100 MG TABLET
100 mg | ORAL_TABLET | Freq: Three times a day (TID) | ORAL | 1 refills | Status: AC | PRN
Start: 2021-05-19 — End: ?

## 2021-05-19 NOTE — Utilization Review (ED)
UM Status: Meets Outpatient Status- s/p CYSTOSCOPY; HOLMIUM LASER ENUCLEATION OF PROSTATE (N/AOutpatient procedure per hospital account maintenance notes and CPT 52649.

## 2021-05-19 NOTE — Other
PHARMACY-ASSISTED MEDICATION REPORTPharmacist review of the best possible medication history obtained by the pharmacy medication history technician has been performed.  I have updated the home medication list and identified the following information that may be relevant to this admission.NOTES/RECOMMENDATIONSResume hydralazine, torsemide when clinically appropriate.        Prior to Admission Medications Medication Name Sig Taking? Patient Reported   Discontinued: 05/19/2021 10:07 AMLast dose: Not Taking at Unknown time   Yes   hydrALAZINE (APRESOLINE) 50 mg tabletLast dose: 05/18/2021 at Unknown time Take 50 mg by mouth 3 (three) times daily.  Yes Yes   labetaloL (NORMODYNE) 100 mg tabletLast dose: 05/18/2021 at Unknown time Take 100 mg by mouth every 12 (twelve) hours. Yes Yes   Discontinued: 05/19/2021 10:07 AMLast dose: Not Taking at Unknown timeLast Medication Note: >> Jens Som   Fri May 13, 2021  1:11 PMNot takingEntered by Jens Som, RN Caleen Essex May 13, 2021 1311   Yes   phenazopyridine (PYRIDIUM) 100 mg tabletLast dose:  --  Take 1 tablet (100 mg total) by mouth 3 (three) times daily as needed (dysuria) for up to 3 days.       Saccharomyces boulardii (FLORASTOR) 250 mg capsuleLast dose: Not Taking at Unknown timeLast Medication Note: >> Mack Hook May 19, 2021 10:16 AMMedHx Tech(Jennifer Ertel, CPHT): FLAG FOR REMOVAL -  Patient started and stopped reported OTC/Vitamin on their ownEntered by Marcelline Deist, CPHT Thu May 19, 2021 1016 Take 250 mg by mouth.Patient not taking: Reported on 05/19/2021   Yes   sulfamethoxazole-trimethoprim (BACTRIM DS;CO-TRIMOXAZOLE DS) 800-160 mg per tabletLast dose: 05/18/2021 at Unknown time Take 1 tablet by mouth 2 (two) times daily for 5 days. Yes     tamsulosin (FLOMAX) 0.4 mg 24 hr capsuleLast dose: Not Taking at Unknown timeLast Medication Note: >> Mack Hook May 19, 2021 10:15 AMMedHx Tech(Jennifer Ertel, CPHT): FLAG FOR REMOVAL -  patient's LIP had instructed to discontinue the medication [confirmed by: patient ]Entered by Marcelline Deist, CPHT Thu May 19, 2021 1015 Take 0.4 mg by mouth daily.Patient not taking: Reported on 05/19/2021   Yes   torsemide (DEMADEX) 20 mg tabletLast dose: 05/17/2021 at Unknown time Take 20 mg by mouth in the morning. Yes Yes   Prior to admission medications last reviewed by Rica Mast, PharmD on Thu May 19, 2021 1151 Thank you,Jerrod Damiano, PharmD5/26/202211:51 AMPhone: MHB

## 2021-05-19 NOTE — Discharge Instructions
Holmium Laser Enucleation of the Prostate (HoLEP) InstructionsWhat to expect:- After a HoLEP procedure, you may feel some pain or spasms when you urinate, burning with urination or the urge to urinate more often. This will get better over time. - It is normal to have some blood in your urine for the first few days and even weeks after surgery. Blood in the urine is like food coloring, so even a drop can turn the urine very red.- You may leak urine for several weeks to months following your surgery. This routinely gets better over time. Ask your doctor about pelvic floor exercises if you are still leaking at the time of your follow up appointment. You may require the use of adult incontinence pads to keep yourself dry in public. Activities: - You may return to your normal activities tomorrow, but refrain from strenuous activity and heavy lifting for the next 2-3 weeksMedications: - If you are having burning with urination, you may take Pyridium. It will turn your urine orange.- Take tylenol for pain. You can take up to the maximum dose indicated on the label. Do not take more than 4,000mg  of total acetaminophen per day.- finish you antibiotic that you were prescribed- Remember to drink lots of water and take gentle laxative/stool softener to prevent constipation. Call your doctor for:- Being unable to keep food down - Difficulty or Inability to Urinate- Fevers/chills- Shortness of breath or chest pain that does not resolve with rest- Questions or concerns!Follow up:You will follow up with Verline Lema, APRN for a post-operative appointment. For any questions or concerns, please call the Urology Clinic at [phone]

## 2021-05-19 NOTE — Other
Urology Post-op check:S:  S/P HOLEP POD #0No N/V, abd pain, CP or SOB.O: Temp:  [97.6 ?F (36.4 ?C)-98 ?F (36.7 ?C)] 97.7 ?F (36.5 ?C)Pulse:  [55-65] 56Resp:  [8-18] 16BP: (135-181)/(72-93) 145/79SpO2:  [94 %-98 %] 98 %Device (Oxygen Therapy): room airO2 Flow (L/min):  [2] 2Physical ExamVitals reviewed. Constitutional:     Appearance: Normal appearance. He is normal weight. HENT:    Head: Normocephalic and atraumatic.    Nose: Nose normal. Eyes:    Pupils: Pupils are equal, round, and reactive to light. Cardiovascular:    Rate and Rhythm: Normal rate and regular rhythm.    Pulses: Normal pulses.    Heart sounds: Normal heart sounds. Pulmonary:    Effort: Pulmonary effort is normal.    Breath sounds: Normal breath sounds. Abdominal:    General: Abdomen is flat. Bowel sounds are normal.    Palpations: Abdomen is soft. Genitourinary:   Comments: 22 Fr 3 way foley in place. CBI running at slow gtt with light to clear pink tinged urineNo CVA tenderness noticedMusculoskeletal:       General: Normal range of motion. Skin:   General: Skin is warm and dry.    Capillary Refill: Capillary refill takes less than 2 seconds. Neurological:    General: No focal deficit present.    Mental Status: He is alert and oriented to person, place, and time. Mental status is at baseline. Psychiatric:       Mood and Affect: Mood normal.       Behavior: Behavior normal.       Thought Content: Thought content normal.       Judgment: Judgment normal. No results found for this or any previous visit (from the past 24 hour(s)).A: S/P HOLEP POD #0P:-Continue CBICBI Instructions:- Continue CBI, titrate drip to a clear, translucent, pink urine output- Recommend using 2 L saline bags, have one running at all time- Match CBI input and output - Stop CBI inflow if: patient develops suprapubic pain or if outflow becomes obstructed. In which case manually irrigate.- Manual irrigation performed with instillation of 120 ml of sterile H2O (via the main foley port) using the catheter tipped syringe (has been left at bedside, please do not discard). Aspirate back 60 ml, then allow rest to drain via gravity. Repeat as necessary. - Some suprapubic discomfort and drainage of urine around catheter is expected due to bladder spasms-Foley cath to gravity with strict I&O and monitoring for change in urine color-Clamp CBI at 5 am on 5/26-Pain control PRN, Antiemetics PRN-Ambulate, I&S q1 hr w/a-DVT ppx- SCDsElectronically Signed by Felix Pacini, APRN, May 18, 2021

## 2021-05-19 NOTE — Plan of Care
Plan of Care Overview/ Patient Status    1900-0700Pt Aox4, calm and cooperative. All safety measures maintained; including call bell in reach and bed in lowest position. VSS on RA. No s/s of respiratory/cardiac distress. No c/o n/v/d/pain. LR@75 . Foley in place - CBI at slow rate; urine clear light pink - clamped @0500 . Triflo. Vboots. Skin as documented. Pt confirms understanding of POC and medications. WCTM this shift. Electronically Signed by Girtha Rm, RN, May 18, 2021

## 2021-05-19 NOTE — Plan of Care
Plan of Care Overview/ Patient Status    Pt is alert and oriented. OOB ambulating. Pt is leaking urine in large amts. Bladder scanned x 1 for 84 mls. Will continue to monitor again in a little while. Pt to ambulate. Call bell in reach. Will continue to monitor. Safety maintained.

## 2021-05-19 NOTE — Plan of Care
Problem: Adult Inpatient Plan of CareGoal: Readiness for Transition of Care5/26/2022 1521 by Ardeth Perfect, AdeolaOutcome: Outcome(s) achieved5/26/2022 1519 by Ardeth Perfect, AdeolaOutcome: Interventions implemented as appropriate Plan of Care Overview/ Patient Status    Patient will dc home with self-care. No post acute care needs identified at this time. Case Management Plan  Flowsheet Row Most Recent Value Discharge Planning  Patient/Patient Representative goals/treatment preferences for discharge are:  Home Patient/Patient Representative was presented with a list of facilities, agencies and/or dme providers and Referral(s) placed for: None Mode of Transportation  Private car  (add comment for special considerations) Patient accompanied by Patient will drive self CM D/C Readiness  PASRR completed and approved N/A Authorization number obtained, if required N/A Is there a 3 day INPATIENT Qualifying stay for Medicare Patients? N/A Medicare IM- signed, dated, timed and scanned, if required N/A DME Authorized/Delivered N/A No needs identified/ follow up with PCP/MD N/A Post acute care services secured W10 complete N/A Pri Completed and Accepted  N/A Is the destination address correct on the W10 N/A Finalized Plan  Expected Discharge Date 05/19/21 Discharge Disposition Home or Self Care   Estella Husk, [facility] Manager [phone]

## 2021-05-19 NOTE — Plan of Care
Problem: Adult Inpatient Plan of CareGoal: Readiness for Transition of CareOutcome: Interventions implemented as appropriate Plan of Care Overview/ Patient Status    70 y/o male   with PMHx of BPH and retention with resulting severe hydronephrosis/renal failure initially managed with bilateral nephrostomy tubes since removed s/p TURP in 12/28/20 and failed voiding trial. Patient presented for procedure: CYSTOSCOPY; HOLMIUM LASER ENUCLEATION OF PROSTATEPatient lives alone and independent with ADLs. Patient not active with HCS. Case Management Screening  Flowsheet Row Most Recent Value Case Management Screening: Chart review completed. If YES to any question below then proceed to CM Eval/Plan  Is there a change in their cognitive function No Do you anticipate a change in this patient's physicial function that will effect discharge needs? No Has there been a readmission within the last 30 days No Were there services prior to admission ( Examples: Assisted Living, HD, Homecare, Extended Care Facility, Methadone, SNF, Outpatient Infusion Center) No Negative/Positive Screen Negative Screening: Case Management department will follow patient's progress and discuss plan of care with treatment team. Case Manager Attestation  I have reviewed the medical record and completed the above screen. CM staff will follow patient's progress and discuss the plan of care with the Treatment Team. Yes   Estella Husk, MSWCase Manager [phone]

## 2021-05-19 NOTE — Utilization Review (ED)
POD#1 s/p HoLEP, CBI clamped, voiding trial anticipated, afebrile, pain controlled, UM Status: UR Reviewed-Continue Outpatient Services , discharge anticipated.Leveda Anna, BSN, RN, ACM-RNUtilization Review SpecialistMobile heartbeat [phone]

## 2021-05-19 NOTE — Plan of Care
Plan of Care Overview/ Patient Status    SOCIAL WORK NOTEPatient Name: Dejean Tribby Record Number: [id number] Date of Birth: July 02, 1952Social Work Follow Up  [employer] Most Recent Value Admission Information  Document Type Progress Note Source of Information Medical Team Level of Care Inpatient Identified Clinical/Disposition, Issues/Barriers: Parking voucher Intervention(s)/Summary I received communication from his RN that he is being discharged and cannot pay to remove his car from the garage.  I will be giving him a parking voucher and have updated the social work department. Collaboration with Treatment Team/Community Providers/Family: I communicated with his nurse and with the social work department. Outcome Resolved Handoff Required? No Next Steps/Plan (including hand-off): There are no further interventions, please reconsult if indicated. Signature: Darcel Smalling, LCSW Contact Information: [phone]

## 2021-05-19 NOTE — Discharge Summary
[facility]	Med/Surg Discharge SummaryPatient Data:  Patient Name: Marc Evans Age: 70 y.o. DOB: [birth year]	 MRN: [id number]	 Admit date: 5/25/2022Discharge date: 5/26/22Discharge Attending Physician: Pamella Pert, MD  PCP: Mignon Pine I, PAPrincipal Diagnosis: BPH with obstruction/lower urinary tract symptomsOther Diagnosis: s/p holep Discharged Condition: goodDisposition: Home Allergies No Known Allergies Hospital Course: 70 yo male with long standing history of BPH with a chronic foley who is s/p holep. He was admitted overnight on CBI.POD#1- CBI ws stopped and foley was removed for a fill and pull vt. He was largely incontinent with low pvr's to 0 and was discharged home.Pertinent Procedures as abovePertinent lab findings and test results:  As aboveDischarge vitals: Blood pressure 114/62, pulse (!) 51, temperature 97.8 ?F (36.6 ?C), temperature source Oral, resp. rate 18, height 5' 11 (1.803 m), weight 106.6 kg, SpO2 96 %.Discharge Physical Exam:Physical ExamPertinent Findings of Physical Exam  As aboveCognitive Status at Discharge: goodPending Labs and Tests: Pending Lab Results   Order Current Status  Surgical case     [facility]) In process  ISSUES TO BE ADDRESSED POST DISCHARGE: 1 follow up office2 finish abx3Discharge Medications: Discharge: Current Discharge Medication List  START taking these medications  Details phenazopyridine (PYRIDIUM) 100 mg tablet Take 1 tablet (100 mg total) by mouth 3 (three) times daily as needed (dysuria) for up to 3 days.Qty: 12 tablet, Refills: 0Start date: 05/19/2021, End date: 05/22/2021   CONTINUE these medications which have NOT CHANGED  Details hydrALAZINE (APRESOLINE) 50 mg tablet Take 50 mg by mouth 3 (three) times daily.   labetaloL (NORMODYNE) 100 mg tablet TAKE ONE TABLET BY MOUTH EVERY 12 HOURS AROUND THE CLOCK  sulfamethoxazole-trimethoprim (BACTRIM DS;CO-TRIMOXAZOLE DS) 800-160 mg per tablet Take 1 tablet by mouth 2 (two) times daily for 5 days.Qty: 10 tablet, Refills: 0  tamsulosin (FLOMAX) 0.4 mg 24 hr capsule Take 0.4 mg by mouth daily.  torsemide (DEMADEX) 20 mg tablet Take 20 mg by mouth in the morning.  Saccharomyces boulardii (FLORASTOR) 250 mg capsule Take 250 mg by mouth.   STOP taking these medications   fish oil-omega-3 fatty acids 1,000 mg capsule    oxybutynin (DITROPAN) 5 mg immediate release tablet    .Marland KitchenFollow-up Information:Cave, Salena Saner, NP[address] [phone] up on 6/27/202210 am PM[facility] PSH Past Medical History: Diagnosis Date  High blood pressure   Hypertension   Past Surgical History: Procedure Laterality Date  TONSILLECTOMY    as a child  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  Social History Family History Social History Tobacco Use  Smoking status: Former Smoker   Quit date: 2000   Years since quitting: 22.4  Smokeless tobacco: Not on file Substance Use Topics  Alcohol use: No  Family History Problem Relation Age of Onset  Arrhythmia Mother   Heart failure Father   Heart attack Father   No Known Problems Sister   No Known Problems Brother   Electronically Signed:Victor [place], [place] 05/19/2021 10:08 AMBest Contact Information: Electronically Signed by Arvilla Market, PA, May 26, 2022Addendum : NONE

## 2021-05-19 NOTE — Progress Notes
Urologic Surgery Progress NoteAttending Provider: Pamella Pert, MDAdmit Date: 5/25/2022Hospital Day: 2Code status: Full Code/ACLSAllergy: Patient has no known allergies.Background:  Marc Evans is a 70 y.o. male w/ PMH with benign prostatic hyperplasia with bladder outlet obstruction who is s/p HoLEP on 5/25Subjective/Interim Events: - VSS, afebrile- Admitted for observation post-operatively- CBI clamped at 5AM, draining light pink urineObjective: Temp:  [97.3 ?F (36.3 ?C)-98.1 ?F (36.7 ?C)] 98.1 ?F (36.7 ?C)Pulse:  [55-65] 58Resp:  [8-18] 18BP: (121-181)/(68-93) 121/68SpO2:  [94 %-98 %] 95 %Device (Oxygen Therapy): room airO2 Flow (L/min):  [2] 2I/O last 3 completed shifts:In: 1000 [I.V.:1000]Out: 3400 [Urine:3400] Last Bladder Scan:   Last Bowel Movement: 05/25/22CBCLab Results Component Value Date  WBC 6.1 04/15/2021  HGB 12.6 (L) 04/15/2021  HCT 37.2 (L) 04/15/2021  PLT 207 04/15/2021 ELECTROLYTESLab Results Component Value Date  NA 138 04/15/2021  K 4.8 04/15/2021  CL 108 04/15/2021  CO2 19 (L) 04/15/2021  CREATININE 2.86 (H) 04/15/2021  BUN 35 (H) 04/15/2021  GLU 79 04/15/2021  CALCIUM 8.4 (L) 04/15/2021 PT/INR/PTTNo results found for: INR, PTTMicrobiology:Lab Results Component Value Date  LABURIN >=100,000 CFU/mL Serratia marcescens (A) 05/03/2021  LABURIN >=100,000 CFU/mL Morganella morganii (A) 05/03/2021 PHYSICAL EXAMPatient exam or treatment required medical chaperone.The sensitive parts of the examination were performed with chaperone present: Yes; Chaperone Name, Role/Title: Dr. Roselie Skinner  Gen: well appearing, resting in bed no acuCV: regular ratePulm: comfortable on room air Abd: soft, non-tender, non-distendedGU: CBI clamped, draining light pink urine, sediment in tubingExt: warm, well perfusedAssessment Marc Evans is a 70 y.o. male, Hospital Day: 2, w/ PMH with benign prostatic hyperplasia with bladder outlet obstruction who is s/p HoLEP on 5/25. Doing well post-operatively, CBI clamped this morning and urine appears light pink this morning. Will plan on void trial with possible discharge later today. Plan Regular diuetRemove Foley, void trial, bladder scan x 2Bactrim x 3 daysIncentive SpirometryDVT PPX:  SCDsOOB, AmbulateDispo: likely discharge to home pending clinical progressSigned:Chrystine Oiler, MDMHB Dynamic Role: [facility] Urology Floor/ConsultAmion for nights/weekends.Attending Addendum:

## 2021-05-24 ENCOUNTER — Ambulatory Visit (INDEPENDENT_AMBULATORY_CARE_PROVIDER_SITE_OTHER): Payer: Medicare HMO

## 2021-05-24 DIAGNOSIS — Z95810 Presence of automatic (implantable) cardiac defibrillator: Secondary | ICD-10-CM

## 2021-05-24 DIAGNOSIS — I5022 Chronic systolic (congestive) heart failure: Secondary | ICD-10-CM

## 2021-05-25 NOTE — Progress Notes (Signed)
EPIC Encounter for ICM Monitoring  Patient Name: Russell [name]. is a 70 y.o. male Date: 05/25/2021 Primary Care Physican: Smitty Cords, DO Primary Cardiologist:Gollan Electrophysiologist:Allred 5/31/2022Weight:160lbs  Spoke with patient and reports feeling well at this time. Denies fluid symptoms.   Optivol thoracic impedancenormal.  Labs: 03/23/2021 Creatinine 1.48, BUN 15, Potassium 4.7, Sodium 140, GFR 52 A complete set of results can be found in Results Review.  Recommendations:No changes and encouraged to call if experiencing any fluid symptoms.  Follow-up plan: ICM clinic phone appointment on7/10/2021. 91 day device clinic remote transmission8/15/2022.  EP/Cardiology Next Office Visit: Recall 03/19/2022 with [name], PA.  Recall 7/18/2022with Dr Mariah Milling   Copy of ICM check sent to Dr.Allred.  3 month ICM trend: 05/24/2021.    1 Year ICM trend:       [name], RN 05/25/2021 5:10 PM

## 2021-05-29 ENCOUNTER — Ambulatory Visit: Admit: 2021-05-29 | Payer: PRIVATE HEALTH INSURANCE | Primary: Internal Medicine

## 2021-06-01 NOTE — Progress Notes (Signed)
Remote ICD transmission.   

## 2021-06-20 ENCOUNTER — Ambulatory Visit: Admit: 2021-06-20 | Payer: MEDICARE | Attending: Adult Health | Primary: Internal Medicine

## 2021-06-20 ENCOUNTER — Encounter: Admit: 2021-06-20 | Payer: PRIVATE HEALTH INSURANCE | Attending: Adult Health | Primary: Internal Medicine

## 2021-06-20 DIAGNOSIS — N400 Enlarged prostate without lower urinary tract symptoms: Secondary | ICD-10-CM

## 2021-06-20 DIAGNOSIS — N399 Disorder of urinary system, unspecified: Secondary | ICD-10-CM

## 2021-06-20 DIAGNOSIS — I10 Essential (primary) hypertension: Secondary | ICD-10-CM

## 2021-06-20 NOTE — Progress Notes
AUA  Flowsheet Row Patient Specific Responses AUA SYMPTOM SCORE BPH  INCOMPLETE EMPTYING: Over the last month, how often have you had a sensation of not emptying your bladder completely after you finished urinating?  Less than 1 time in 5 FREQUENCY: Over the last month, how often have you had to urinate again less than 2 hours after you finished urinating?  Less than half the time INTERMITTENCY: Over the last month, how often have you stopped and started again several times when you urinated? Not at all URGENCY: During the last month, how often have you found it difficult to postpone urination?  Less than half the time WEAK STREAM: During the last month, how often have you had a weak urinary stream?  Less than 1 time in 5 STRAINING: During the last month, how often have you had to push or strain to begin urination?  Less than 1 time in 5 NOCTURIA: During the last month, how many times did you most typically get up to urinate from the time you went to bed at night until the time you got up in the morning?  3 times a night Total Score 10 If you were to spend the rest of your life with your urinary condition just the way it is now, how would you feel about that?  Pleased Total Score (with QOL question) 11

## 2021-06-20 NOTE — Progress Notes
Follow-up Note Marc Evans is a 70 y.o. 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 He underwent HOLEP surgery with Dr. Oliva Bustard on 05/18/21.Today the patient endorses a moderate urinary stream, he is working on controlling urinary frequency/urgency, nocturia x 3-5. Very minor urinary incontinence, sporadic, improving, noted mostly at end of urinary stream. He is satisfied with improvements in urinary pattern. Denies dysuria, urine is cloudy, no hematuria. Went to gym for the first time last night, no issues. He has been sexually active, he may have had small amount of ejaculate.He was not able to provide a sample in office today. Pathology of prostatic chips demonstrated BPH, 160 grams in aggregate.SDS: 1  Past Medical History: Diagnosis Date ? High blood pressure  ? Hypertension  Past Surgical History: Procedure Laterality Date ? TONSILLECTOMY    as a child ? 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 No Known AllergiesReview Of SystemsReview of Systems As per HPI.Physical ExamTemp (!) 96.8 ?F (36 ?C) (No-touch scanner)  - Ht 5' 11 (1.803 m)  - Wt 106.6 kg  - BMI 32.78 kg/m? No results found for: UCOLOR, UGLUCOSE, UKETONE, USPECGRAVITY, UPH, UPROTEIN, UNITRATES, UBLOOD, ULEUKOCYTES    Post Void Bladder Scan Measurement Date Value Ref Range Status 06/20/2021 29 mL Final Physical ExamAppearance: Normal appearing male HENT: normocephalic and atraumatic.Neck. Neck is supple Chest: Normal respiratory effort without distress. Musculoskeletal: Normal range of motion. Skin: Skin is warm without obvious lesions. Mental status: Awake and alert.Assessment & Plan:Marc Evans is a 69 y.o. male s/p HOLEP.Discussion:Marc Evans continues to do well post operatively.  I provided anticipatory guidance for post operative course, including improvement of transient urinary leakage, hematuria, and irritative symptoms over time. BPH medications may be discontinued.  He will have PSA checked in 3 mo to establish new PSA baseline.  He will follow up with Dr. Oliva Bustard in 3 months, and will reach out with any concerns in the interim.  He will submit UA to [facility].Encounter Diagnoses Name SNOMED [place](R) Primary? ? Disease of urinary tract DISORDER OF URINARY TRACT Yes French Ana, NP

## 2021-06-24 ENCOUNTER — Encounter: Admit: 2021-06-24 | Payer: PRIVATE HEALTH INSURANCE | Attending: Adult Health | Primary: Internal Medicine

## 2021-06-24 ENCOUNTER — Telehealth: Admit: 2021-06-24 | Payer: PRIVATE HEALTH INSURANCE | Attending: Adult Health | Primary: Internal Medicine

## 2021-06-24 LAB — ZZZURINALYSIS WITH CULTURE REFLEX     (L Q)
BILIRUBIN: NEGATIVE
GLUCOSE UA (QUEST): NEGATIVE
HYALINE CASTS: NONE SEEN /LPF
KETONES UA (QUEST): NEGATIVE
NITRITE UA: NEGATIVE
PH: 6.5 (ref 5.0–8.0)
SPECIFIC GRAVITY UA: 1.011 (ref 1.001–1.035)
WBC, UA: >=60 /HPF — AB (ref <=5)

## 2021-06-24 MED ORDER — SULFAMETHOXAZOLE 800 MG-TRIMETHOPRIM 160 MG TABLET
800-160 mg | ORAL_TABLET | Freq: Two times a day (BID) | ORAL | 1 refills | Status: DC
Start: 2021-06-24 — End: 2022-04-10

## 2021-06-24 NOTE — Progress Notes
UC 10-49k Serratia marcescens, Bactrim x 10 days prescribed.

## 2021-06-24 NOTE — Telephone Encounter
Called and spoke with [name], relayed the message below. [name] verbalized understanding. ----- Message from Lyndel Pleasure, NP sent at 06/24/2021  4:31 PM EDT -----Please advise patient UC +, Bactrim sent to pharmacy.

## 2021-06-24 NOTE — Other
Please advise patient UC +, Bactrim sent to pharmacy.

## 2021-07-04 ENCOUNTER — Ambulatory Visit (INDEPENDENT_AMBULATORY_CARE_PROVIDER_SITE_OTHER): Payer: Medicare HMO

## 2021-07-04 ENCOUNTER — Other Ambulatory Visit: Payer: Self-pay | Admitting: Cardiovascular Disease

## 2021-07-04 DIAGNOSIS — Z95810 Presence of automatic (implantable) cardiac defibrillator: Secondary | ICD-10-CM

## 2021-07-04 DIAGNOSIS — I5022 Chronic systolic (congestive) heart failure: Secondary | ICD-10-CM

## 2021-07-07 ENCOUNTER — Telehealth: Payer: Self-pay

## 2021-07-07 NOTE — Progress Notes (Signed)
EPIC Encounter for ICM Monitoring  Patient Name: Russell [name]. is a 70 y.o. male Date: 07/07/2021 Primary Care Physican: Russell Cords, DO Primary Cardiologist: Russell [name] Electrophysiologist: Russell [name] 05/24/2021 Weight: 160 lbs                                                          Attempted call to patient and unable to reach.   Transmission reviewed.    Optivol thoracic impedance normal.    Labs: 03/23/2021 Creatinine 1.48, BUN 15, Potassium 4.7, Sodium 140, GFR 52 A complete set of results can be found in Results Review.   Recommendations: Unable to reach.     Follow-up plan: ICM clinic phone appointment on 08/09/2021.   91 day device clinic remote transmission 08/08/2021.    EP/Cardiology Next Office Visit:  Recall 03/19/2022 with Russell Saber, PA.  07/11/2021 with Dr Russell [name]      Copy of ICM check sent to Dr. Johney [name].     3 month ICM trend: 07/04/2021.    1 Year ICM trend:       Russell Soda, RN 07/07/2021 7:37 AM

## 2021-07-07 NOTE — Telephone Encounter (Signed)
Remote ICM transmission received.  Attempted call to patient regarding ICM remote transmission and no voice mail box set up.  

## 2021-07-10 ENCOUNTER — Other Ambulatory Visit: Payer: Self-pay | Admitting: Cardiovascular Disease

## 2021-07-10 NOTE — Progress Notes (Deleted)
Cardiology Office Note  Date:  07/10/2021   ID:  Russell [name]., DOB January 23, 1951, MRN [id number]  PCP:  Smitty Cords, DO   No chief complaint on file.   HPI:  Russell [name] is a 70 year old gentleman with a history of  diabetes,  Hypertension, who initially presented for abnormal EKG. he had a colonoscopy. During the procedure, noted to have abnormal telemetry. EKG was performed that showed anterolateral wall abnormality.  echocardiogram showed depressed ejection fraction 25-30%.   2014 pharmacologic Myoview suggested mid to distal anterior wall perfusion defect concerning for ischemia. He was started on beta blocker and ACE inhibitor He underwent cardiac catheterization given the perfusion defect and severely depressed ejection fraction.   this showed no significant CAD, ejection fraction had improved up to 40%. He presents today for routine follow-up of his nonischemic cardiomyopathy and hypertension, has ICD  On today's visit reports that he feels well Wants to start working out at the gym  Pacer/ICD download reviewed and discussed on today's visit Normal device function.    1 NSVT lasting 7 beats w/ rates 180's bpm  optivol is low/good, discussed in detail  Only taking imdur 60 daily.  Prior prescription was 90 twice daily Not taking spironolactone secondary to side effects Tolerating Coreg and Entresto Tolerating isosorbide 60 twice daily  Prior echo discussed Ejection fraction 20 to 25%,  Follow-up echo up to 30 to 35% September 2020 Declining repeat study  Followed [facility] EP in [facility]  EKG personally reviewed by myself on todays visit Shows  normal sinus rhythm rate 63 bpm anterolateral T wave abnormality  unchanges  Other past medical history reviewed On 05/03/2019,  playing baseball,  developed chest pain, collapsed,  unresponsive. Bystander started CPR   in ventricular fibrillation and required 5 shocks while in the field with subsequent ROSC.  He was  intubated  Total downtime estimated at approximately 5 minutes. Cooling protocol.  Troponin peaked at 0.69.   sustained VT on 05/06/2019 with successful defibrillation in the hospital  started on Amiodarone infusion  05/07/2019,  right and left heart catheterization with mild luminal irregularities   severely reduced LVEF (20-25%) with global hypokinesis  ICD was placed  PMH:   has a past medical history of CAD (coronary artery disease), Diabetes (HCC), Frequent PVCs, Heart failure with reduced ejection fraction (HCC), Hypertension, Impotence of organic origin, NICM (nonischemic cardiomyopathy) (HCC), Pain in joint, shoulder region, Personal history of colonic polyps, and Ventricular tachycardia (HCC).  PSH:    Past Surgical History:  Procedure Laterality Date   CARDIAC CATHETERIZATION  11/17/2013   COLONOSCOPY  2009   ICD IMPLANT N/A 05/08/2019   Procedure: ICD IMPLANT;  Surgeon: Hillis Range, MD;  Location: [facility] INVASIVE CV LAB;  Service: Cardiovascular;  Laterality: N/A;   RIGHT/LEFT HEART CATH AND CORONARY ANGIOGRAPHY N/A 05/07/2019   Procedure: RIGHT/LEFT HEART CATH AND CORONARY ANGIOGRAPHY;  Surgeon: Yvonne Kendall, MD;  Location: [facility] INVASIVE CV LAB;  Service: Cardiovascular;  Laterality: N/A;    Current Outpatient Medications  Medication Sig Dispense Refill   amiodarone (PACERONE) 200 MG tablet Take 0.5 tablets (100 mg total) by mouth daily. 45 tablet 3   brimonidine (ALPHAGAN) 0.2 % ophthalmic solution Place 1 drop into both eyes 2 (two) times daily.     carvedilol (COREG) 25 MG tablet TAKE 1 TABLET TWICE DAILY 180 tablet 0   Cholecalciferol (VITAMIN D3) 2000 units TABS Take 1 capsule by mouth daily.      glucose blood test strip Use  as instructed to check blood sugar up to x 2 daily 100 each 12   isosorbide mononitrate (IMDUR) 60 MG 24 hr tablet Take 1 tablet (60 mg total) by mouth daily. 90 tablet 3   Lancets (ONETOUCH ULTRASOFT) lancets Use as instructed to check blood  sugar up to x 2 daily 100 each 12   latanoprost (XALATAN) 0.005 % ophthalmic solution Place 1 drop into both eyes at bedtime.      metFORMIN (GLUCOPHAGE-XR) 500 MG 24 hr tablet TAKE 1 TABLET BY MOUTH EVERY DAY WITH BREAKFAST 90 tablet 3   nitroGLYCERIN (NITROSTAT) 0.4 MG SL tablet DISSOLVE ONE TABLET UNDER THE TONGUE EVERY 5 MINUTES AS NEEDED FOR CHEST PAIN.  DO NOT EXCEED A TOTAL OF 3 DOSES IN 15 MINUTES 25 tablet 0   polyvinyl alcohol (LIQUIFILM TEARS) 1.4 % ophthalmic solution Place 2 drops into both eyes every 8 (eight) hours. 15 mL 0   sacubitril-valsartan (ENTRESTO) 97-103 MG Take 1 tablet by mouth 2 (two) times daily. 180 tablet 3   tamsulosin (FLOMAX) 0.4 MG CAPS capsule Take 0.4 mg by mouth daily.     timolol (TIMOPTIC) 0.5 % ophthalmic solution Place 1 drop into both eyes 2 (two) times daily.      No current facility-administered medications for this visit.    Allergies:   Patient has no known allergies.   Social History:  The patient  reports that he has never smoked. He has never used smokeless tobacco. He reports current alcohol use. He reports that he does not use drugs.   Family History:   family history includes Colon cancer in his father; Diabetes in his mother; Hypertension in his mother.    Review of Systems: Review of Systems  Constitutional: Negative.   HENT: Negative.    Respiratory: Negative.    Cardiovascular: Negative.   Gastrointestinal: Negative.   Musculoskeletal: Negative.   Neurological: Negative.   Psychiatric/Behavioral: Negative.    All other systems reviewed and are negative.  PHYSICAL EXAM: VS:  There were no vitals taken for this visit. , BMI There is no height or weight on file to calculate BMI. Constitutional:  oriented to person, place, and time. No distress.  HENT:  Head: Grossly normal Eyes:  no discharge. No scleral icterus.  Neck: No JVD, no carotid bruits  Cardiovascular: Regular rate and rhythm, no murmurs  appreciated Pulmonary/Chest: Clear to auscultation bilaterally, no wheezes or rails Abdominal: Soft.  no distension.  no tenderness.  Musculoskeletal: Normal range of motion Neurological:  normal muscle tone. Coordination normal. No atrophy Skin: Skin warm and dry Psychiatric: normal affect, pleasant  Recent Labs: 08/17/2020: Hemoglobin 13.9; Platelets 156 03/23/2021: ALT 25; BUN 15; Creatinine, Ser 1.48; Potassium 4.7; Sodium 140; TSH 1.010    Lipid Panel Lab Results  Component Value Date   CHOL 184 08/17/2020   HDL 72 08/17/2020   LDLCALC 94 08/17/2020   TRIG 89 08/17/2020    Wt Readings from Last 3 Encounters:  04/12/21 160 lb (72.6 kg)  03/23/21 160 lb 6.4 oz (72.8 kg)  02/18/21 159 lb 9.6 oz (72.4 kg)     ASSESSMENT AND PLAN:  Congestive dilated cardiomyopathy (HCC)  Continue Coreg and Entresto, isosorbide 60 daily We previously increased the isosorbide dosing, he has only continued 60 daily Labs 06/2020 amiodarone surveillance  Nonischemic cardiomyopathy Nonobstructive coronary disease by prior catheterization Unable to exclude hypertensive heart disease Declining any medication changes at this time, was on higher dose isosorbide but cut this back to 60  daily on his own Could not tolerate spironolactone Ejection fraction 30 to 35%  Controlled type 2 diabetes mellitus without complication, unspecified long term insulin use status (HCC) --  A1c 7.5 Recommended low carbohydrate diet, workout at the gym  VF Defibrillator in place, no recent events Download reviewed with him, low OptiVol   Total encounter time more than 25 minutes Greater than 50% was spent in counseling and coordination of care with the patient     No orders of the defined types were placed in this encounter.    Signed, Dossie Arbour, M.D., Ph.D. 07/10/2021  [facility] [place], [place] [phone]

## 2021-07-11 ENCOUNTER — Ambulatory Visit: Payer: Medicare HMO | Admitting: Cardiovascular Disease

## 2021-07-18 ENCOUNTER — Telehealth: Payer: Self-pay | Admitting: Cardiovascular Disease

## 2021-07-18 NOTE — Telephone Encounter (Signed)
Attempted to contact patient in reference to refill request but no answer or voicemail to leave a message. According to patient's last OV with Dr. Mariah Milling, spironolactone was discontinued and patient was not taking. Will try again later to contact patient to get clarification on refill request.

## 2021-07-18 NOTE — Telephone Encounter (Signed)
*  STAT* If patient is at the pharmacy, call can be transferred to refill team.   1. Which medications need to be refilled? (please list name of each medication and dose if known) Spironolactone 25 mg  2. Which pharmacy/location (including street and city if local pharmacy) is medication to be sent to? [facility]  3. Do they need a 30 day or 90 day supply? 90

## 2021-07-20 NOTE — Telephone Encounter (Signed)
Called and spoke with patient, per patient he never stopped taking Spironolactone. However, according the OV notes in January, Dr. Mariah Milling noted patient stated he could not tolerate the Spironolactone therefore it was discontinued. He has been taking it for his BP and would like to know if anything else was prescribed to replace the Spironolactone.

## 2021-07-23 ENCOUNTER — Telehealth: Admit: 2021-07-23 | Payer: PRIVATE HEALTH INSURANCE | Attending: Medical | Primary: Internal Medicine

## 2021-07-23 NOTE — Telephone Encounter
I left the patient a message when they call back let them know their visit with Jacklynn Bue on 9/20 has been cancelled and rescheduled the provider will not be available that day thank you

## 2021-07-25 MED ORDER — SPIRONOLACTONE 25 MG PO TABS: 25.0000 mg | ORAL_TABLET | Freq: Every day | ORAL | 3 refills | Status: DC

## 2021-07-25 NOTE — Telephone Encounter (Signed)
Script sent to [facility]) for Spironolactone 25 mg Daily okay per Dr. Mariah Milling to refill   "Would hope he can stay on spironolactone 25 daily  Can refill it  Encouraged him to stay on it given cardiac benefits  Thx  TG"  Medication was added back to pt's current med list

## 2021-08-08 ENCOUNTER — Ambulatory Visit (INDEPENDENT_AMBULATORY_CARE_PROVIDER_SITE_OTHER): Payer: Medicare HMO

## 2021-08-08 DIAGNOSIS — I428 Other cardiomyopathies: Secondary | ICD-10-CM

## 2021-08-09 ENCOUNTER — Ambulatory Visit (INDEPENDENT_AMBULATORY_CARE_PROVIDER_SITE_OTHER): Payer: Medicare HMO

## 2021-08-09 DIAGNOSIS — Z95810 Presence of automatic (implantable) cardiac defibrillator: Secondary | ICD-10-CM

## 2021-08-09 DIAGNOSIS — I5022 Chronic systolic (congestive) heart failure: Secondary | ICD-10-CM | POA: Diagnosis not present

## 2021-08-09 NOTE — Progress Notes (Signed)
EPIC Encounter for ICM Monitoring  Patient Name: Russell [name]. is a 70 y.o. male Date: 08/09/2021 Primary Care Physican: Smitty Cords, DO Primary Cardiologist: Mariah Milling Electrophysiologist: Allred 08/09/2021 Weight: 160 lbs                                                          Spoke with patient and heart failure questions reviewed.  Pt asymptomatic for fluid accumulation and feeling well.  Discussed any recent changes and he has been eating more restaurant foods in the last month.  Explained restaurant foods are typically very high in salt and may contain as much as 3 -4 times the amount he should have in a day which may lead to fluid retention.     Optivol thoracic impedance suggesting possible fluid accumulation since 07/18/2021.  Fluid index >threshold starting 08/03/2021.    Prescribed: Spironolactone 25 mg take 1 tablet daily  Labs: 03/23/2021 Creatinine 1.48, BUN 15, Potassium 4.7, Sodium 140, GFR 52 A complete set of results can be found in Results Review.   Recommendations: Recommendation to limit salt intake to 2000 mg daily and to avoid restaurant foods if possible.  Encouraged to call if experiencing any fluid symptoms.    Follow-up plan: ICM clinic phone appointment on 08/15/2021 to recheck fluid levels.   91 day device clinic remote transmission 11/07/2021.    EP/Cardiology Next Office Visit:  Recall 03/19/2022 with [name], PA.  08/24/2021 with Dr Mariah Milling      Copy of ICM check sent to Dr. Johney Frame and Dr Mariah Milling for review.     3 month ICM trend: 08/08/2021.    1 Year ICM trend:       [name], RN 08/09/2021 8:46 AM

## 2021-08-10 LAB — CUP PACEART REMOTE DEVICE CHECK
Battery Remaining Longevity: 121 mo
Battery Voltage: 3.02 V
Brady Statistic RV Percent Paced: 0.09 %
Date Time Interrogation Session: 20220815012304
HighPow Impedance: 49 Ohm
Implantable Lead Implant Date: 20200514
Implantable Lead Location: 753860
Implantable Lead Model: 6935
Implantable Pulse Generator Implant Date: 20200514
Lead Channel Impedance Value: 323 Ohm
Lead Channel Impedance Value: 399 Ohm
Lead Channel Pacing Threshold Amplitude: 0.625 V
Lead Channel Pacing Threshold Pulse Width: 0.4 ms
Lead Channel Sensing Intrinsic Amplitude: 9.5 mV
Lead Channel Sensing Intrinsic Amplitude: 9.5 mV
Lead Channel Setting Pacing Amplitude: 2 V
Lead Channel Setting Pacing Pulse Width: 0.4 ms
Lead Channel Setting Sensing Sensitivity: 0.3 mV

## 2021-08-15 ENCOUNTER — Ambulatory Visit: Payer: Medicare HMO

## 2021-08-15 DIAGNOSIS — I5022 Chronic systolic (congestive) heart failure: Secondary | ICD-10-CM

## 2021-08-15 DIAGNOSIS — Z95810 Presence of automatic (implantable) cardiac defibrillator: Secondary | ICD-10-CM

## 2021-08-16 NOTE — Progress Notes (Signed)
EPIC Encounter for ICM Monitoring  Patient Name: Russell [name]. is a 70 y.o. male Date: 08/16/2021 Primary Care Physican: Smitty Cords, DO Primary Cardiologist: Mariah Milling Electrophysiologist: Allred 08/09/2021 Weight: 160 lbs (does not weigh very often)                                                          Spoke with patient and heart failure questions reviewed.  Pt asymptomatic for fluid accumulation and feeling well.  He reports making dietary changes.   Optivol thoracic impedance suggesting fluid levels returned close to normal after making dietary changes.  Fluid index >threshold starting 08/03/2021.    Prescribed: Spironolactone 25 mg take 1 tablet daily   Labs: 03/23/2021 Creatinine 1.48, BUN 15, Potassium 4.7, Sodium 140, GFR 52 A complete set of results can be found in Results Review.   Recommendations: Recommendation to limit salt intake to 2000 mg daily and to avoid restaurant foods if possible.  Encouraged to call if experiencing any fluid symptoms.    Follow-up plan: ICM clinic phone appointment on 08/23/2021 to recheck fluid levels.   91 day device clinic remote transmission 11/07/2021.    EP/Cardiology Next Office Visit:  Recall 03/19/2022 with [name], PA.  08/24/2021 with Dr Mariah Milling      Copy of ICM check sent to Dr. Johney Frame.   3 month ICM trend: 08/15/2021.    1 Year ICM trend:       [name], RN 08/16/2021 12:48 PM

## 2021-08-17 ENCOUNTER — Other Ambulatory Visit: Payer: Self-pay

## 2021-08-17 DIAGNOSIS — Z0100 Encounter for examination of eyes and vision without abnormal findings: Secondary | ICD-10-CM | POA: Diagnosis not present

## 2021-08-17 DIAGNOSIS — I5022 Chronic systolic (congestive) heart failure: Secondary | ICD-10-CM

## 2021-08-17 DIAGNOSIS — N401 Enlarged prostate with lower urinary tract symptoms: Secondary | ICD-10-CM

## 2021-08-17 DIAGNOSIS — Z0000 Encounter for general adult medical examination without abnormal findings: Secondary | ICD-10-CM

## 2021-08-17 DIAGNOSIS — H401130 Primary open-angle glaucoma, bilateral, stage unspecified: Secondary | ICD-10-CM | POA: Diagnosis not present

## 2021-08-17 DIAGNOSIS — E1136 Type 2 diabetes mellitus with diabetic cataract: Secondary | ICD-10-CM

## 2021-08-17 DIAGNOSIS — I10 Essential (primary) hypertension: Secondary | ICD-10-CM

## 2021-08-17 DIAGNOSIS — R3911 Hesitancy of micturition: Secondary | ICD-10-CM

## 2021-08-17 DIAGNOSIS — H402213 Chronic angle-closure glaucoma, right eye, severe stage: Secondary | ICD-10-CM | POA: Diagnosis not present

## 2021-08-17 LAB — HM DIABETES EYE EXAM

## 2021-08-18 ENCOUNTER — Other Ambulatory Visit: Payer: Medicare HMO

## 2021-08-18 DIAGNOSIS — E1136 Type 2 diabetes mellitus with diabetic cataract: Secondary | ICD-10-CM | POA: Diagnosis not present

## 2021-08-18 DIAGNOSIS — Z0000 Encounter for general adult medical examination without abnormal findings: Secondary | ICD-10-CM | POA: Diagnosis not present

## 2021-08-18 DIAGNOSIS — I10 Essential (primary) hypertension: Secondary | ICD-10-CM | POA: Diagnosis not present

## 2021-08-18 DIAGNOSIS — R3911 Hesitancy of micturition: Secondary | ICD-10-CM | POA: Diagnosis not present

## 2021-08-18 DIAGNOSIS — N401 Enlarged prostate with lower urinary tract symptoms: Secondary | ICD-10-CM | POA: Diagnosis not present

## 2021-08-18 DIAGNOSIS — I5022 Chronic systolic (congestive) heart failure: Secondary | ICD-10-CM | POA: Diagnosis not present

## 2021-08-19 ENCOUNTER — Other Ambulatory Visit: Payer: Self-pay

## 2021-08-19 DIAGNOSIS — Z0100 Encounter for examination of eyes and vision without abnormal findings: Secondary | ICD-10-CM | POA: Diagnosis not present

## 2021-08-19 LAB — COMPLETE METABOLIC PANEL WITH GFR
AG Ratio: 1.5 (calc) (ref 1.0–2.5)
ALT: 14 U/L (ref 9–46)
AST: 11 U/L (ref 10–35)
Albumin: 4 g/dL (ref 3.6–5.1)
Alkaline phosphatase (APISO): 45 U/L (ref 35–144)
BUN: 15 mg/dL (ref 7–25)
CO2: 25 mmol/L (ref 20–32)
Calcium: 8.8 mg/dL (ref 8.6–10.3)
Chloride: 106 mmol/L (ref 98–110)
Creat: 1.21 mg/dL (ref 0.70–1.35)
Globulin: 2.6 g/dL (calc) (ref 1.9–3.7)
Glucose, Bld: 152 mg/dL — ABNORMAL HIGH (ref 65–99)
Potassium: 3.9 mmol/L (ref 3.5–5.3)
Sodium: 139 mmol/L (ref 135–146)
Total Bilirubin: 0.5 mg/dL (ref 0.2–1.2)
Total Protein: 6.6 g/dL (ref 6.1–8.1)
eGFR: 65 mL/min/{1.73_m2} (ref >=60)

## 2021-08-19 LAB — HEMOGLOBIN A1C
Hgb A1c MFr Bld: 7.4 % of total Hgb — ABNORMAL HIGH (ref <5.7)
Mean Plasma Glucose: 166 mg/dL
eAG (mmol/L): 9.2 mmol/L

## 2021-08-19 LAB — CBC WITH DIFFERENTIAL/PLATELET
Absolute Monocytes: 524 cells/uL (ref 200–950)
Basophils Absolute: 29 cells/uL (ref 0–200)
Basophils Relative: 0.5 %
Eosinophils Absolute: 120 cells/uL (ref 15–500)
Eosinophils Relative: 2.1 %
HCT: 41.5 % (ref 38.5–50.0)
Hemoglobin: 13.1 g/dL — ABNORMAL LOW (ref 13.2–17.1)
Lymphs Abs: 1300 cells/uL (ref 850–3900)
MCH: 30.3 pg (ref 27.0–33.0)
MCHC: 31.6 g/dL — ABNORMAL LOW (ref 32.0–36.0)
MCV: 95.8 fL (ref 80.0–100.0)
MPV: 11.1 fL (ref 7.5–12.5)
Monocytes Relative: 9.2 %
Neutro Abs: 3728 cells/uL (ref 1500–7800)
Neutrophils Relative %: 65.4 %
Platelets: 178 10*3/uL (ref 140–400)
RBC: 4.33 10*6/uL (ref 4.20–5.80)
RDW: 14.2 % (ref 11.0–15.0)
Total Lymphocyte: 22.8 %
WBC: 5.7 10*3/uL (ref 3.8–10.8)

## 2021-08-19 LAB — LIPID PANEL
Cholesterol: 192 mg/dL (ref <200)
HDL: 69 mg/dL (ref >=40)
LDL Cholesterol (Calc): 103 mg/dL (calc) — ABNORMAL HIGH
Non-HDL Cholesterol (Calc): 123 mg/dL (calc) (ref <130)
Total CHOL/HDL Ratio: 2.8 (calc) (ref <5.0)
Triglycerides: 106 mg/dL (ref <150)

## 2021-08-19 LAB — PSA: PSA: 3.61 ng/mL (ref <=4.00)

## 2021-08-23 ENCOUNTER — Ambulatory Visit (INDEPENDENT_AMBULATORY_CARE_PROVIDER_SITE_OTHER): Payer: Medicare HMO

## 2021-08-23 DIAGNOSIS — I5022 Chronic systolic (congestive) heart failure: Secondary | ICD-10-CM

## 2021-08-23 DIAGNOSIS — Z95810 Presence of automatic (implantable) cardiac defibrillator: Secondary | ICD-10-CM

## 2021-08-23 NOTE — Progress Notes (Signed)
EPIC Encounter for ICM Monitoring  Patient Name: Russell [name]. is a 70 y.o. male Date: 08/23/2021 Primary Care Physican: Smitty Cords, DO Primary Cardiologist: Mariah Milling Electrophysiologist: Allred 08/09/2021 Weight: 160 lbs (does not weigh very often)                                                          Transmission reviewed.    Optivol thoracic impedance suggesting fluid levels trending close to baseline normal but fluid index remains > threshold starting 08/03/2021.    Prescribed: Spironolactone 25 mg take 1 tablet daily   Labs: 03/23/2021 Creatinine 1.48, BUN 15, Potassium 4.7, Sodium 140, GFR 52 A complete set of results can be found in Results Review.   Recommendations:  No changes.    Follow-up plan: ICM clinic phone appointment on 09/26/2021.   91 day device clinic remote transmission 11/07/2021.    EP/Cardiology Next Office Visit:  Recall 03/19/2022 with [name], PA.  08/24/2021 with Dr Mariah Milling      Copy of ICM check sent to Dr. Johney Frame.    3 month ICM trend: 08/23/2021.    1 Year ICM trend:       [name], RN 08/23/2021 2:03 PM

## 2021-08-23 NOTE — Progress Notes (Signed)
Cardiology Office Note  Date:  08/24/2021   ID:  [name], DOB [birth year], MRN [id number]  PCP:  Smitty Cords, DO   Chief Complaint  Patient presents with   6 month follow up     "Doing well." Patient c/o elevated blood pressure for the past two weeks. Medications reviewed by the patient verbally.     HPI:  Mr. Loman is a 70 year old gentleman with a history of  diabetes,  Hypertension, who initially presented for abnormal EKG. he had a colonoscopy. During the procedure, noted to have abnormal telemetry. EKG was performed that showed anterolateral wall abnormality.  echocardiogram showed depressed ejection fraction 25-30%.   2014 pharmacologic Myoview suggested mid to distal anterior wall perfusion defect concerning for ischemia. He was started on beta blocker and ACE inhibitor He underwent cardiac catheterization given the perfusion defect and severely depressed ejection fraction.   this showed no significant CAD, ejection fraction had improved up to 40%. He presents today for routine follow-up of his nonischemic cardiomyopathy and hypertension, has ICD  Reports doing relatively well overall Typically, Blood pressure 128/82 at home Last month higher numbers, 140-145 systolic Drinking more coffee Reports compliance with his medications  OptiVol running high over the past several weeks, reviewed with him Active, not going to the gym  On last clinic visit, Only taking imdur 60 daily.  Prior prescription was 90 twice daily Not taking spironolactone secondary to side effects Tolerating Coreg and Entresto Tolerating isosorbide 60 twice daily  On today's visit , thinks he might be taking the spironolactone but not sure  Prior echo discussed Ejection fraction 20 to 25%,  Follow-up echo up to 30 to 35% September 2020 Declining repeat study  Followed by EP in [facility]  EKG personally reviewed by myself on todays visit Shows  normal sinus rhythm rate 54 bpm  anterolateral T wave abnormality  unchanges  Other past medical history reviewed On 05/03/2019,  playing baseball,  developed chest pain, collapsed,  unresponsive. Bystander started CPR   in ventricular fibrillation and required 5 shocks while in the field with subsequent ROSC.  He was intubated  Total downtime estimated at approximately 5 minutes. Cooling protocol.  Troponin peaked at 0.69.   sustained VT on 05/06/2019 with successful defibrillation in the hospital  started on Amiodarone infusion  05/07/2019,  right and left heart catheterization with mild luminal irregularities   severely reduced LVEF (20-25%) with global hypokinesis  ICD was placed  PMH:   has a past medical history of CAD (coronary artery disease), Diabetes (HCC), Frequent PVCs, Heart failure with reduced ejection fraction (HCC), Hypertension, Impotence of organic origin, NICM (nonischemic cardiomyopathy) (HCC), Pain in joint, shoulder region, Personal history of colonic polyps, and Ventricular tachycardia (HCC).  PSH:    Past Surgical History:  Procedure Laterality Date   CARDIAC CATHETERIZATION  11/17/2013   COLONOSCOPY  2009   ICD IMPLANT N/A 05/08/2019   Procedure: ICD IMPLANT;  Surgeon: Hillis Range, MD;  [facility];  Service: Cardiovascular;  Laterality: N/A;   RIGHT/LEFT HEART CATH AND CORONARY ANGIOGRAPHY N/A 05/07/2019   Procedure: RIGHT/LEFT HEART CATH AND CORONARY ANGIOGRAPHY;  Surgeon: Yvonne Kendall, MD;  Location: [facility] INVASIVE CV LAB;  Service: Cardiovascular;  Laterality: N/A;    Current Outpatient Medications  Medication Sig Dispense Refill   amiodarone (PACERONE) 200 MG tablet Take 0.5 tablets (100 mg total) by mouth daily. 45 tablet 3   brimonidine (ALPHAGAN) 0.2 % ophthalmic solution Place 1 drop into both  eyes 2 (two) times daily.     carvedilol (COREG) 25 MG tablet TAKE 1 TABLET TWICE DAILY 180 tablet 0   Cholecalciferol (VITAMIN D3) 2000 units TABS Take 1 capsule by mouth  daily.      dorzolamide-timolol (COSOPT) 22.3-6.8 MG/ML ophthalmic solution Place 1 drop into both eyes 2 (two) times daily.     glucose blood test strip Use as instructed to check blood sugar up to x 2 daily 100 each 12   isosorbide mononitrate (IMDUR) 60 MG 24 hr tablet Take 1 tablet (60 mg total) by mouth daily. 90 tablet 3   Lancets (ONETOUCH ULTRASOFT) lancets Use as instructed to check blood sugar up to x 2 daily 100 each 12   latanoprost (XALATAN) 0.005 % ophthalmic solution Place 1 drop into both eyes at bedtime.      metFORMIN (GLUCOPHAGE-XR) 500 MG 24 hr tablet TAKE 1 TABLET BY MOUTH EVERY DAY WITH BREAKFAST 90 tablet 3   nitroGLYCERIN (NITROSTAT) 0.4 MG SL tablet DISSOLVE ONE TABLET UNDER THE TONGUE EVERY 5 MINUTES AS NEEDED FOR CHEST PAIN.  DO NOT EXCEED A TOTAL OF 3 DOSES IN 15 MINUTES 25 tablet 0   polyvinyl alcohol (LIQUIFILM TEARS) 1.4 % ophthalmic solution Place 2 drops into both eyes every 8 (eight) hours. 15 mL 0   sacubitril-valsartan (ENTRESTO) 97-103 MG Take 1 tablet by mouth 2 (two) times daily. 180 tablet 3   spironolactone (ALDACTONE) 25 MG tablet Take 1 tablet (25 mg total) by mouth daily. 90 tablet 3   tamsulosin (FLOMAX) 0.4 MG CAPS capsule Take 0.4 mg by mouth daily.     timolol (TIMOPTIC) 0.5 % ophthalmic solution Place 1 drop into both eyes 2 (two) times daily.      No current facility-administered medications for this visit.    Allergies:   Patient has no known allergies.   Social History:  The patient  reports that he has never smoked. He has never used smokeless tobacco. He reports current alcohol use. He reports that he does not use drugs.   Family History:   family history includes Colon cancer in his father; Diabetes in his mother; Hypertension in his mother.    Review of Systems: Review of Systems  Constitutional: Negative.   HENT: Negative.    Respiratory: Negative.    Cardiovascular: Negative.   Gastrointestinal: Negative.   Musculoskeletal:  Negative.   Neurological: Negative.   Psychiatric/Behavioral: Negative.    All other systems reviewed and are negative.  PHYSICAL EXAM: VS:  BP (!) 168/80 (BP Location: Left Arm, Patient Position: Sitting, Cuff Size: Normal)   Pulse (!) 54   Ht 5\' 8"  (1.727 m)   Wt 159 lb 2 oz (72.2 kg)   SpO2 98%   BMI 24.19 kg/m  , BMI Body mass index is 24.19 kg/m. Constitutional:  oriented to person, place, and time. No distress.  HENT:  Head: Grossly normal Eyes:  no discharge. No scleral icterus.  Neck: No JVD, no carotid bruits  Cardiovascular: Regular rate and rhythm, no murmurs appreciated Pulmonary/Chest: Clear to auscultation bilaterally, no wheezes or rails Abdominal: Soft.  no distension.  no tenderness.  Musculoskeletal: Normal range of motion Neurological:  normal muscle tone. Coordination normal. No atrophy Skin: Skin warm and dry Psychiatric: normal affect, pleasant  Recent Labs: 03/23/2021: TSH 1.010 08/18/2021: ALT 14; BUN 15; Creat 1.21; Hemoglobin 13.1; Platelets 178; Potassium 3.9; Sodium 139    Lipid Panel Lab Results  Component Value Date   CHOL 192 08/18/2021  HDL 69 08/18/2021   LDLCALC 103 (H) 08/18/2021   TRIG 106 08/18/2021    Wt Readings from Last 3 Encounters:  08/24/21 159 lb 2 oz (72.2 kg)  04/12/21 160 lb (72.6 kg)  03/23/21 160 lb 6.4 oz (72.8 kg)     ASSESSMENT AND PLAN:  Congestive dilated cardiomyopathy (HCC)  Continue Coreg and Entresto,  Recommend increase isosorbide up to 60 mg twice daily given high blood pressure Labs March 2021 and August 2022 amiodarone surveillance OptiVol increased, recommend he cut back on fluid and salt intake We will follow-up on OptiVol  Nonischemic cardiomyopathy Nonobstructive coronary disease by prior catheterization Unable to exclude hypertensive heart disease Recommend he continue spironolactone 25 daily Increase isosorbide up to 60 twice daily given high blood pressure Ejection fraction 30 to  35%  Controlled type 2 diabetes mellitus without complication, unspecified long term insulin use status (HCC) --  A1c low 7 range Recommended low carbohydrate diet,  Exercise program  VF Defibrillator in place, no recent events Download reviewed with him, higher OptiVol  Total encounter time more than 25 minutes Greater than 50% was spent in counseling and coordination of care with the patient     No orders of the defined types were placed in this encounter.    Signed, Dossie Arbour, M.D., Ph.D. 08/24/2021  [facility] [place], [place] [phone]

## 2021-08-24 ENCOUNTER — Ambulatory Visit (INDEPENDENT_AMBULATORY_CARE_PROVIDER_SITE_OTHER): Payer: Medicare HMO | Admitting: Cardiovascular Disease

## 2021-08-24 ENCOUNTER — Other Ambulatory Visit: Payer: Self-pay

## 2021-08-24 ENCOUNTER — Encounter: Payer: Self-pay | Admitting: Cardiovascular Disease

## 2021-08-24 VITALS — BP 168/80 | HR 54 | Ht 68.0 in | Wt 159.1 lb

## 2021-08-24 DIAGNOSIS — I110 Hypertensive heart disease with heart failure: Secondary | ICD-10-CM | POA: Diagnosis not present

## 2021-08-24 DIAGNOSIS — I472 Ventricular tachycardia, unspecified: Secondary | ICD-10-CM

## 2021-08-24 DIAGNOSIS — I428 Other cardiomyopathies: Secondary | ICD-10-CM | POA: Diagnosis not present

## 2021-08-24 DIAGNOSIS — I5022 Chronic systolic (congestive) heart failure: Secondary | ICD-10-CM | POA: Diagnosis not present

## 2021-08-24 MED ORDER — CARVEDILOL 25 MG PO TABS: 25.0000 mg | ORAL_TABLET | Freq: Two times a day (BID) | ORAL | 3 refills | Status: DC

## 2021-08-24 MED ORDER — ISOSORBIDE MONONITRATE ER 60 MG PO TB24: 60.0000 mg | ORAL_TABLET | Freq: Two times a day (BID) | ORAL | 3 refills | Status: DC

## 2021-08-24 MED ORDER — SACUBITRIL-VALSARTAN 97-103 MG PO TABS: 1.0000 | ORAL_TABLET | Freq: Two times a day (BID) | ORAL | 3 refills | Status: DC

## 2021-08-24 NOTE — Patient Instructions (Addendum)
Watch the fluid intake  Medication Instructions:  Please increase the isosorbide up to twice a day  If you need a refill on your cardiac medications before your next appointment, please call your pharmacy.   Lab work: No new labs needed  Testing/Procedures: No new testing needed  Follow-Up: At [facility], you and your health needs are our priority.  As part of our continuing mission to provide you with exceptional heart care, we have created designated Provider Care Teams.  These Care Teams include your primary Cardiologist (physician) and Advanced Practice Providers (APPs -  Physician Assistants and Nurse Practitioners) who all work together to provide you with the care you need, when you need it.  You will need a follow up appointment in 6 months  Providers on your designated Care Team:   Nicolasa Ducking, NP Eula Listen, PA-C Marisue Ivan, PA-C Cadence Reddick, [place]  COVID-19 Vaccine Information can be found at: [email or web address] For questions related to vaccine distribution or appointments, please email [email or web address] or call [phone].

## 2021-08-25 ENCOUNTER — Ambulatory Visit (INDEPENDENT_AMBULATORY_CARE_PROVIDER_SITE_OTHER): Payer: Medicare HMO | Admitting: Family Medicine

## 2021-08-25 ENCOUNTER — Other Ambulatory Visit: Payer: Self-pay | Admitting: Family Medicine

## 2021-08-25 VITALS — BP 147/88 | HR 60 | Ht 68.0 in | Wt 159.0 lb

## 2021-08-25 DIAGNOSIS — E1136 Type 2 diabetes mellitus with diabetic cataract: Secondary | ICD-10-CM

## 2021-08-25 DIAGNOSIS — I5022 Chronic systolic (congestive) heart failure: Secondary | ICD-10-CM | POA: Diagnosis not present

## 2021-08-25 DIAGNOSIS — I428 Other cardiomyopathies: Secondary | ICD-10-CM

## 2021-08-25 DIAGNOSIS — Z23 Encounter for immunization: Secondary | ICD-10-CM | POA: Diagnosis not present

## 2021-08-25 DIAGNOSIS — I420 Dilated cardiomyopathy: Secondary | ICD-10-CM

## 2021-08-25 DIAGNOSIS — Z0000 Encounter for general adult medical examination without abnormal findings: Secondary | ICD-10-CM

## 2021-08-25 DIAGNOSIS — E7800 Pure hypercholesterolemia, unspecified: Secondary | ICD-10-CM

## 2021-08-25 DIAGNOSIS — I10 Essential (primary) hypertension: Secondary | ICD-10-CM

## 2021-08-25 MED ORDER — PRAVASTATIN SODIUM 10 MG PO TABS: 10.0000 mg | ORAL_TABLET | Freq: Every day | ORAL | 3 refills | Status: DC

## 2021-08-25 NOTE — Assessment & Plan Note (Signed)
Mostly controlled DM A1c 7.4 improved No evidence of hypoglycemia Complications - DM cataracts, glaucoma, other including atherosclerotic vascular disease - increases risk of future cardiovascular complications   Plan: 1. CONTINUE Metformin XR 500mg  daily 2. Encourage improved lifestyle - low carb, low sugar diet, reduce portion size, continue improving regular exercise - reviewed diet recommendation 3. Check CBG, bring log to next visit for review 4. Continue ACEi, ASA DC ASA Start Statin Pravastatin 10mg  nightly counseling on benefit risk ASCVD risk reduction, potential side effect Repeat labs lipid 3 months

## 2021-08-25 NOTE — Progress Notes (Signed)
Subjective:    Patient ID: Russell [name]., male    DOB: 05/04/1951, 70 y.o.   MRN: [id number]  Christine Schiefelbein. is a 70 y.o. male presenting on 08/25/2021 for Annual Exam and Diabetes   HPI  Here for Annual Physical and lab Review  CHRONIC DM, Type 2 with cataracts / Atherosclerotic vascular disease He is doing well, A1c 7.5 previously. Now due for A1c today CBGs: Checks CBG every other day, no new readings lately, average previously 120-150 Meds: Metformin 500mg  XR daily - previously reduced from BID Reports good compliance. Tolerating well w/o side-effects Currently on ACEi Not on Statin therapy. Interested for low dose today. Lifestyle:  - Diet (tries to follow DM diet, occasional fried food) - Exercise (He has a gym pass and plans to resume exercise) - He is now followed by [facility] see below, for glaucoma and cataracts Denies hypoglycemia, polyuria, visual changes, numbness or tingling.     CHRONIC HTN / Systolic CHF / Cardiomyopathy Followed by Cardiology Current Meds - Carvedilol 25mg  BID, Entrestro 97-103 BID, Isosorbide BID Reports good compliance, took meds today. Tolerating well, w/o complaints. Lifestyle: Denies CP, dyspnea, HA, edema, dizziness / lightheadedness  Admits darkened urine, he believes on medication seems to cause it.  Health Maintenance:  Due for 2nd pneumonia vaccine >1 year after previous vaccine, now to receive Pneumovax-23 today   Needs shingrix in future, declines now   Colon CA Screening: Last Colonoscopy 09/24/2013 (done by Dr ), results with no polyps, good for 10 years. Currently asymptomatic. known family history of colon CA   Cologuard done 09/01/20 - negative. Good for 3 years.  BPH Followed by Dr [facility] yearly. On Tamsulosin daily PSA stable at 3.6, prior 3.4, in past within 5 years up to peak 4  Depression screen [facility] 2/9 08/25/2021 04/12/2021 08/19/2020  Decreased Interest 0 0 0  Down, Depressed,  Hopeless 0 0 0  PHQ - 2 Score 0 0 0  Altered sleeping 0 - -  Tired, decreased energy 0 - -  Change in appetite 0 - -  Feeling bad or failure about yourself  0 - -  Trouble concentrating 0 - -  Moving slowly or fidgety/restless 0 - -  Suicidal thoughts 0 - -  PHQ-9 Score 0 - -  Difficult doing work/chores Not difficult at all - -    Past Medical History:  Diagnosis Date   CAD (coronary artery disease)    05/07/2019 LHC showed nonobstructive CAD   Diabetes (HCC)    Frequent PVCs    Heart failure with reduced ejection fraction (HCC)    04/2019 EF 20-25%   Hypertension    Impotence of organic origin    NICM (nonischemic cardiomyopathy) (HCC)    HFrEF in setting of NICM   Pain in joint, shoulder region    Personal history of colonic polyps    Ventricular tachycardia (HCC)    04/2019 in setting of cardiac arrest   Past Surgical History:  Procedure Laterality Date   CARDIAC CATHETERIZATION  11/17/2013   COLONOSCOPY  2009   ICD IMPLANT N/A 05/08/2019   Procedure: ICD IMPLANT;  Surgeon: 2010, MD;  Location: [facility];  Service: Cardiovascular;  Laterality: N/A;   RIGHT/LEFT HEART CATH AND CORONARY ANGIOGRAPHY N/A 05/07/2019   Procedure: RIGHT/LEFT HEART CATH AND CORONARY ANGIOGRAPHY;  Surgeon: Hillis Range, MD;  Location: [facility] INVASIVE CV LAB;  Service: Cardiovascular;  Laterality: N/A;   Social History  Socioeconomic History   Marital status: Married    Spouse name: Not on file   Number of children: Not on file   Years of education: Not on file   Highest education level: Not on file  Occupational History   Occupation: [employer]   Occupation: retired  Tobacco Use   Smoking status: Never   Smokeless tobacco: Never  Vaping Use   Vaping Use: Never used  Substance and Sexual Activity   Alcohol use: Yes    Comment: occassional   Drug use: No   Sexual activity: Not on file  Other Topics Concern   Not on file  Social History Narrative   Not on  file   Social Determinants of Health   Financial Resource Strain: Low Risk    Difficulty of Paying Living Expenses: Not hard at all  Food Insecurity: No Food Insecurity   Worried About Programme researcher, broadcasting/film/video in the Last Year: Never true   Ran Out of Food in the Last Year: Never true  Transportation Needs: No Transportation Needs   Lack of Transportation (Medical): No   Lack of Transportation (Non-Medical): No  Physical Activity: Insufficiently Active   Days of Exercise per Week: 4 days   Minutes of Exercise per Session: 30 min  Stress: No Stress Concern Present   Feeling of Stress : Not at all  Social Connections: Not on file  Intimate Partner Violence: Not on file   Family History  Problem Relation Age of Onset   Colon cancer Father        33's   Hypertension Mother    Diabetes Mother    Current Outpatient Medications on File Prior to Visit  Medication Sig   amiodarone (PACERONE) 200 MG tablet Take 0.5 tablets (100 mg total) by mouth daily.   brimonidine (ALPHAGAN) 0.2 % ophthalmic solution Place 1 drop into both eyes 2 (two) times daily.   carvedilol (COREG) 25 MG tablet Take 1 tablet (25 mg total) by mouth 2 (two) times daily.   Cholecalciferol (VITAMIN D3) 2000 units TABS Take 1 capsule by mouth daily.    dorzolamide-timolol (COSOPT) 22.3-6.8 MG/ML ophthalmic solution Place 1 drop into both eyes 2 (two) times daily.   glucose blood test strip Use as instructed to check blood sugar up to x 2 daily   isosorbide mononitrate (IMDUR) 60 MG 24 hr tablet Take 1 tablet (60 mg total) by mouth 2 (two) times daily.   Lancets (ONETOUCH ULTRASOFT) lancets Use as instructed to check blood sugar up to x 2 daily   latanoprost (XALATAN) 0.005 % ophthalmic solution Place 1 drop into both eyes at bedtime.    metFORMIN (GLUCOPHAGE-XR) 500 MG 24 hr tablet TAKE 1 TABLET BY MOUTH EVERY DAY WITH BREAKFAST   nitroGLYCERIN (NITROSTAT) 0.4 MG SL tablet DISSOLVE ONE TABLET UNDER THE TONGUE EVERY 5  MINUTES AS NEEDED FOR CHEST PAIN.  DO NOT EXCEED A TOTAL OF 3 DOSES IN 15 MINUTES   polyvinyl alcohol (LIQUIFILM TEARS) 1.4 % ophthalmic solution Place 2 drops into both eyes every 8 (eight) hours.   sacubitril-valsartan (ENTRESTO) 97-103 MG Take 1 tablet by mouth 2 (two) times daily.   spironolactone (ALDACTONE) 25 MG tablet Take 1 tablet (25 mg total) by mouth daily.   tamsulosin (FLOMAX) 0.4 MG CAPS capsule Take 0.4 mg by mouth daily.   timolol (TIMOPTIC) 0.5 % ophthalmic solution Place 1 drop into both eyes 2 (two) times daily.    No current facility-administered medications on file  prior to visit.    Review of Systems  Constitutional:  Negative for activity change, appetite change, chills, diaphoresis, fatigue and fever.  HENT:  Negative for congestion and hearing loss.   Eyes:  Negative for visual disturbance.  Respiratory:  Negative for cough, chest tightness, shortness of breath and wheezing.   Cardiovascular:  Negative for chest pain, palpitations and leg swelling.  Gastrointestinal:  Negative for abdominal pain, constipation, diarrhea, nausea and vomiting.  Genitourinary:  Negative for dysuria, frequency and hematuria.  Musculoskeletal:  Negative for arthralgias and neck pain.  Skin:  Negative for rash.  Neurological:  Negative for dizziness, weakness, light-headedness, numbness and headaches.  Hematological:  Negative for adenopathy.  Psychiatric/Behavioral:  Negative for behavioral problems, dysphoric mood and sleep disturbance.   Per HPI unless specifically indicated above     Objective:    BP (!) 147/88   Pulse 60   Ht 5\' 8"  (1.727 m)   Wt 159 lb (72.1 kg)   SpO2 100%   BMI 24.18 kg/m   Wt Readings from Last 3 Encounters:  08/25/21 159 lb (72.1 kg)  08/24/21 159 lb 2 oz (72.2 kg)  04/12/21 160 lb (72.6 kg)    Physical Exam Vitals and nursing note reviewed.  Constitutional:      General: He is not in acute distress.    Appearance: He is well-developed. He is  not diaphoretic.     Comments: Well-appearing, comfortable, cooperative  HENT:     Head: Normocephalic and atraumatic.  Eyes:     General:        Right eye: No discharge.        Left eye: No discharge.     Conjunctiva/sclera: Conjunctivae normal.     Pupils: Pupils are equal, round, and reactive to light.  Neck:     Thyroid: No thyromegaly.  Cardiovascular:     Rate and Rhythm: Normal rate and regular rhythm.     Pulses: Normal pulses.     Heart sounds: Normal heart sounds. No murmur heard. Pulmonary:     Effort: Pulmonary effort is normal. No respiratory distress.     Breath sounds: Normal breath sounds. No wheezing or rales.  Abdominal:     General: Bowel sounds are normal. There is no distension.     Palpations: Abdomen is soft. There is no mass.     Tenderness: There is no abdominal tenderness.  Musculoskeletal:        General: No tenderness. Normal range of motion.     Cervical back: Normal range of motion and neck supple.     Right lower leg: No edema.     Left lower leg: No edema.     Comments: Upper / Lower Extremities: - Normal muscle tone, strength bilateral upper extremities 5/5, lower extremities 5/5  Lymphadenopathy:     Cervical: No cervical adenopathy.  Skin:    General: Skin is warm and dry.     Findings: No erythema or rash.  Neurological:     Mental Status: He is alert and oriented to person, place, and time.     Comments: Distal sensation intact to light touch all extremities  Psychiatric:        Mood and Affect: Mood normal.        Behavior: Behavior normal.        Thought Content: Thought content normal.     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 CONTINUE Metformin XR 500mg  daily 2. Encourage improved lifestyle - low carb, low sugar diet, reduce portion size, continue improving regular exercise - reviewed diet recommendation 3. Check CBG, bring log to next visit for review 4. Continue ACEi, ASA DC ASA Start Statin Pravastatin 10mg  nightly counseling on benefit risk ASCVD risk reduction, potential side effect Repeat labs lipid 3 months       Relevant Medications   pravastatin (PRAVACHOL) 10 MG tablet   Congestive dilated cardiomyopathy (HCC)   Relevant Medications   pravastatin (PRAVACHOL) 10 MG tablet   Chronic systolic heart failure (HCC)   Relevant Medications   pravastatin (PRAVACHOL) 10 MG tablet   Other Visit Diagnoses     Annual physical exam    -  Primary   Need for 23-polyvalent pneumococcal polysaccharide vaccine       Relevant Orders   Pneumococcal polysaccharide vaccine 23-valent greater than or equal to 2yo subcutaneous/IM (Completed)      Updated Health Maintenance information Pneumovax 23 today Reviewed recent lab results with patient Encouraged improvement to lifestyle with diet and exercise Goal of weight loss  Cardiology following Currently on med management   Meds ordered this encounter  Medications   pravastatin (PRAVACHOL) 10 MG tablet    Sig: Take 1 tablet (10 mg total) by mouth at bedtime.    Dispense:  90 tablet    Refill:  3      Follow up plan: Return in about 3 months (around 11/24/2021) for 3 month fasting lab only then 1 week later Telephone Virtual visit lab  result.  11/16/21 CMET Lipid, A1c  Saralyn Pilar, DO [facility] 08/25/2021, 9:20 AM

## 2021-08-25 NOTE — Patient Instructions (Addendum)
Thank you for coming to the office today.  Keep on current medications. Keep up the great work  Recent Labs    02/18/21 0934 08/18/21 0758  HGBA1C 7.7* 7.4*   Ask Dr Evelene Croon about the darker urine.  You are at increased risk of future Cardiovascular complications such as Heart Attack or Stroke from an artery blockage due to abnormal cholesterol and/or risk factors. - As discussed, Statin Cholesterol pills both can both LOWER cholesterol and REDUCE this future risk of heart attack and stroke - Start Pravastatin (generic) 10mg  pill once at bedtime every night  If you develop mild aches or pains in muscle or joint that does NOT improve or go away after first 3-4 weeks then this may require to adjust the dose. First I would recommend STOPPING the medication for a few weeks until your ache and pain symptoms completely RESOLVE. Then you can restart at a LOWER DOSE either HALF a pill at bedtime every night or LESS OFTEN such as one pill a week only and then gradually increase to every other day or max dose of 3 times a week  Lastly, sometimes we need to try other versions of this medicine to find one that works for you and does not cause side effects.   DUE for FASTING BLOOD WORK (no food or drink after midnight before the lab appointment, only water or coffee without cream/sugar on the morning of)  SCHEDULE "Lab Only" visit in the morning at the clinic for lab draw in 3 MONTHS   - Make sure Lab Only appointment is at about 1 week before your next appointment, so that results will be available  For Lab Results, once available within 2-3 days of blood draw, you can can log in to MyChart online to view your results and a brief explanation. Also, we can discuss results at next follow-up visit.   Please schedule a Follow-up Appointment to: Return in about 3 months (around 11/24/2021) for 3 month fasting lab only then 1 week later Telephone Virtual visit lab result.  If you have any other  questions or concerns, please feel free to call the office or send a message through MyChart. You may also schedule an earlier appointment if necessary.  Additionally, you may be receiving a survey about your experience at our office within a few days to 1 week by e-mail or mail. We value your feedback.  14/12/2020, DO [facility], [facility]

## 2021-08-26 NOTE — Progress Notes (Signed)
Remote ICD transmission.   

## 2021-08-31 DIAGNOSIS — Z20822 Contact with and (suspected) exposure to covid-19: Secondary | ICD-10-CM | POA: Diagnosis not present

## 2021-09-12 ENCOUNTER — Ambulatory Visit: Admit: 2021-09-12 | Payer: MEDICARE | Attending: Adult Health | Primary: Internal Medicine

## 2021-09-13 ENCOUNTER — Ambulatory Visit: Admit: 2021-09-13 | Payer: MEDICARE | Attending: Medical | Primary: Internal Medicine

## 2021-09-23 ENCOUNTER — Ambulatory Visit (INDEPENDENT_AMBULATORY_CARE_PROVIDER_SITE_OTHER): Payer: Medicare HMO

## 2021-09-23 ENCOUNTER — Other Ambulatory Visit: Payer: Self-pay

## 2021-09-23 DIAGNOSIS — Z23 Encounter for immunization: Secondary | ICD-10-CM | POA: Diagnosis not present

## 2021-09-26 ENCOUNTER — Ambulatory Visit (INDEPENDENT_AMBULATORY_CARE_PROVIDER_SITE_OTHER): Payer: Medicare HMO

## 2021-09-26 DIAGNOSIS — I5022 Chronic systolic (congestive) heart failure: Secondary | ICD-10-CM | POA: Diagnosis not present

## 2021-09-26 DIAGNOSIS — Z95810 Presence of automatic (implantable) cardiac defibrillator: Secondary | ICD-10-CM | POA: Diagnosis not present

## 2021-09-27 ENCOUNTER — Ambulatory Visit: Admit: 2021-09-27 | Payer: MEDICARE | Attending: Medical | Primary: Internal Medicine

## 2021-09-27 ENCOUNTER — Encounter: Admit: 2021-09-27 | Payer: PRIVATE HEALTH INSURANCE | Attending: Medical | Primary: Internal Medicine

## 2021-09-27 DIAGNOSIS — R399 Unspecified symptoms and signs involving the genitourinary system: Secondary | ICD-10-CM

## 2021-09-27 DIAGNOSIS — I10 Essential (primary) hypertension: Secondary | ICD-10-CM

## 2021-09-27 NOTE — Progress Notes
Marc Evans is a 70 y.o. male with history of urinary retention with bilateral hydronephrosis and renal failure requiring bilateral nephrostomy tubes who ultimately underwent HOLEP surgery with Dr. Oliva Bustard on 05/18/21.His serum creatinine was 2.86 with an eGFR of 22 on 4/22/22He is here today for follow up. He is pleased with is voiding pattern.  He has a strong stream and denies any hematuria or dysuria. He has nocturia x  nocturia x 3-5.  He has rate urinary incontinence. He has been sexually active and notes slight climacturia.He denies any CP, SOB, fever, chills, nausea or vomiting.  He has occasional mild pedal edema.Pathology of prostatic chips demonstrated BPH, 160 grams in aggregate. PMH significant for HTN.  Denies DM. He is a non-smoker and does not use alcohol or drugs.Past Medical History: Diagnosis Date ? High blood pressure  ? Hypertension  Past Surgical History: Procedure Laterality Date ? TONSILLECTOMY    as a child ? 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 No Known AllergiesReview Of SystemsReview of Systems As per HPI.Physical ExamTemp (!) 96 ?F (35.6 ?C) (No-touch scanner)  - Ht 5' 11 (1.803 m)  - Wt 104.3 kg  - BMI 32.08 kg/m?  Post Void Bladder Scan Measurement Date Value Ref Range Status 09/27/2021 18 mL Final   Comment:   pt urinated 5 min ago Physical ExamAppearance: Normal appearing male HENT: normocephalic and atraumatic.Neck. Neck is supple Chest: Normal respiratory effort without distress. Flank: No CVA tenderness.Abdomen: Obese, soft and non-tender.DRE: Declined.Musculoskeletal: Normal range of motion. Skin: Skin is warm without obvious lesions. Extremities: WWP, no pedal edema.Mental status: Awake and alert.Assessment & Plan:Marc Evans is a 70 y.o.non-diabetic male with HTN s/p HOLEP for management of urinary retention with renal failure.  He is doing well. Will update the PSA for new baseline and check BMP.  He was encouraged to be compliant with his anti-hypertensive medications, to limit salt intake and to get moderate exercise as well as try losing weight.  Plan follow up in 6 months.Patient encourage to contact us in the interim should he have any questions, concerns or new symptoms.Encounter Diagnoses Name SNOMED [place](R) Primary? ? Lower urinary tract symptoms (LUTS) LOWER URINARY TRACT SYMPTOMS Yes Encounter time: 30 minutes were spent reviewing the chart, taking a history, examining the patient, counseling the patient and making medical decisions, and coordinating follow up care.Caro Hight, PA

## 2021-09-28 DIAGNOSIS — N189 Chronic kidney disease, unspecified: Secondary | ICD-10-CM

## 2021-09-30 NOTE — Progress Notes (Signed)
EPIC Encounter for ICM Monitoring  Patient Name: Russell [name]. is a 70 y.o. male Date: 09/30/2021 Primary Care Physican: Smitty Cords, DO Primary Cardiologist: Mariah Milling Electrophysiologist: Allred 09/30/2021 Weight: 158-160 lbs                                                           Spoke with patient and heart failure questions reviewed.  Pt asymptomatic for fluid accumulation and feeling well.   Optivol thoracic impedance suggesting normal fluid levels.    Prescribed: Spironolactone 25 mg take 1 tablet daily   Labs: 03/23/2021 Creatinine 1.48, BUN 15, Potassium 4.7, Sodium 140, GFR 52 A complete set of results can be found in Results Review.   Recommendations: No changes and encouraged to call if experiencing any fluid symptoms.   Follow-up plan: ICM clinic phone appointment on 10/31/2021.   91 day device clinic remote transmission 11/07/2021.    EP/Cardiology Next Office Visit:  Recall 03/19/2022 with [name], PA.      Copy of ICM check sent to Dr. Johney Frame.     3 month ICM trend: 09/26/2021.    1 Year ICM trend:     [name], RN 09/30/2021 10:58 AM

## 2021-10-11 ENCOUNTER — Telehealth: Admit: 2021-10-11 | Payer: PRIVATE HEALTH INSURANCE | Attending: Medical | Primary: Internal Medicine

## 2021-10-11 DIAGNOSIS — N401 Enlarged prostate with lower urinary tract symptoms: Secondary | ICD-10-CM

## 2021-10-11 NOTE — Telephone Encounter
Reviewed BMP and called patient.Renal fxn stable since HOLEP.Will obtain renal US to confirm resolution of hydro.Update PSA.Patient will follow up as arranged in 03/2022.He is encouraged to contact us in the interim should he have any questions, concerns or new symptoms.Component Ref Range & Units 10/10/21 ?2:42 PM 04/15/21 12:00 AM Glucose 65 - 99 mg/dL 89  79 R, CM  Comment: ? ? ? ? ? ? ?Fasting reference interval ?  BUN 7 - 25 mg/dL 31?High?  35?High?  Creatinine 0.70 - 1.35 mg/dL 3.87?High?  2.86?High? R, CM  eGFR (Creatinine) > OR = 60 mL/min/1.20m2 27?Low?   Comment: The eGFR is based on the CKD-EPI 2021 equation. To calculate the new eGFR from a previous Creatinine or Cystatin C result, go to https://www.kidney.org/professionals/ kdoqi/gfr%5Fcalculator  BUN/Creatinine Ratio 6 - 22 (calc) 12  12  Sodium 135 - 146 mmol/L 138  138  Potassium 3.5 - 5.3 mmol/L 3.8  4.8  Chloride 98 - 110 mmol/L 106  108  CO2 20 - 32 mmol/L 22  19?Low?  Calcium 8.6 - 10.3 mg/dL 8.5?Low?  8.4?Low?

## 2021-10-13 ENCOUNTER — Telehealth: Admit: 2021-10-13 | Payer: PRIVATE HEALTH INSURANCE | Attending: Medical | Primary: Internal Medicine

## 2021-10-13 ENCOUNTER — Inpatient Hospital Stay: Admit: 2021-10-13 | Discharge: 2021-10-13 | Payer: MEDICARE | Primary: Internal Medicine

## 2021-10-13 DIAGNOSIS — N138 Other obstructive and reflux uropathy: Secondary | ICD-10-CM

## 2021-10-13 DIAGNOSIS — N401 Enlarged prostate with lower urinary tract symptoms: Secondary | ICD-10-CM

## 2021-10-13 NOTE — Telephone Encounter
Patient needs [place] prior to office visit scheduled in April of 2023.LVM-please assist upon return call. If patient will be scheduling outside of [place] confirm location and date of service and route message to [facility] scheduling. Thanks

## 2021-10-14 LAB — PSA, TOTAL (SCREENING) (BH GH L LMW YH): BKR PROSTATE SPECIFIC ANTIGEN, SCREENING: 1.041 ng/mL (ref 0.000–3.900)

## 2021-10-26 ENCOUNTER — Encounter: Admit: 2021-10-26 | Payer: PRIVATE HEALTH INSURANCE | Primary: Internal Medicine

## 2021-10-26 NOTE — Telephone Encounter
Tried reaching patient--the phone number on file has been disconnected. My chart message sent advising [place] needed prior to 03/28/22.

## 2021-10-31 ENCOUNTER — Ambulatory Visit (INDEPENDENT_AMBULATORY_CARE_PROVIDER_SITE_OTHER): Payer: Medicare HMO

## 2021-10-31 DIAGNOSIS — I5022 Chronic systolic (congestive) heart failure: Secondary | ICD-10-CM | POA: Diagnosis not present

## 2021-10-31 DIAGNOSIS — Z95810 Presence of automatic (implantable) cardiac defibrillator: Secondary | ICD-10-CM

## 2021-11-04 NOTE — Progress Notes (Signed)
EPIC Encounter for ICM Monitoring  Patient Name: Russell [name]. is a 70 y.o. male Date: 11/04/2021 Primary Care Physican: Smitty Cords, DO Primary Cardiologist: Mariah Milling Electrophysiologist: Allred 09/30/2021 Weight: 158-160 lbs                                                           Spoke with patient and heart failure questions reviewed.  Pt asymptomatic for fluid accumulation and feeling well.   Optivol thoracic impedance suggesting normal fluid levels.    Prescribed: Spironolactone 25 mg take 1 tablet daily   Labs: 03/23/2021 Creatinine 1.48, BUN 15, Potassium 4.7, Sodium 140, GFR 52 A complete set of results can be found in Results Review.   Recommendations:  No changes and encouraged to call if experiencing any fluid symptoms.   Follow-up plan: ICM clinic phone appointment on 12/05/2021.   91 day device clinic remote transmission 02/06/2022.    EP/Cardiology Next Office Visit:  Recall 03/19/2022 with [name], PA.   Recall 02/21/2022 with Dr Mariah Milling   Copy of ICM check sent to Dr. Johney Frame.    3 month ICM trend: 10/31/2021.    1 Year ICM trend:       [name], RN 11/04/2021 9:51 AM

## 2021-11-07 ENCOUNTER — Ambulatory Visit (INDEPENDENT_AMBULATORY_CARE_PROVIDER_SITE_OTHER): Payer: Medicare HMO

## 2021-11-07 ENCOUNTER — Telehealth: Payer: Self-pay | Admitting: Cardiovascular Disease

## 2021-11-07 DIAGNOSIS — I428 Other cardiomyopathies: Secondary | ICD-10-CM | POA: Diagnosis not present

## 2021-11-07 LAB — CUP PACEART REMOTE DEVICE CHECK
Battery Remaining Longevity: 119 mo
Battery Voltage: 3.01 V
Brady Statistic RV Percent Paced: 0.23 %
Date Time Interrogation Session: 20221114033625
HighPow Impedance: 54 Ohm
Implantable Lead Implant Date: 20200514
Implantable Lead Location: 753860
Implantable Lead Model: 6935
Implantable Pulse Generator Implant Date: 20200514
Lead Channel Impedance Value: 304 Ohm
Lead Channel Impedance Value: 380 Ohm
Lead Channel Pacing Threshold Amplitude: 0.75 V
Lead Channel Pacing Threshold Pulse Width: 0.4 ms
Lead Channel Sensing Intrinsic Amplitude: 14.625 mV
Lead Channel Sensing Intrinsic Amplitude: 14.625 mV
Lead Channel Setting Pacing Amplitude: 2 V
Lead Channel Setting Pacing Pulse Width: 0.4 ms
Lead Channel Setting Sensing Sensitivity: 0.3 mV

## 2021-11-07 MED ORDER — SACUBITRIL-VALSARTAN 97-103 MG PO TABS: 1.0000 | ORAL_TABLET | Freq: Two times a day (BID) | ORAL | 3 refills | Status: DC

## 2021-11-07 NOTE — Telephone Encounter (Signed)
Patient dropped of  PAF put in box

## 2021-11-07 NOTE — Telephone Encounter (Signed)
Received PA application Completed provider's portion, attached copy of insurance card and med list Dr. Gollan has signed Form faxed and placed in file cabinet  

## 2021-11-13 ENCOUNTER — Inpatient Hospital Stay: Admit: 2021-11-13 | Discharge: 2021-11-13 | Payer: MEDICARE | Primary: Internal Medicine

## 2021-11-13 DIAGNOSIS — N138 Other obstructive and reflux uropathy: Secondary | ICD-10-CM

## 2021-11-13 DIAGNOSIS — N401 Enlarged prostate with lower urinary tract symptoms: Secondary | ICD-10-CM

## 2021-11-14 NOTE — Progress Notes (Signed)
Remote ICD transmission.   

## 2021-11-15 ENCOUNTER — Other Ambulatory Visit: Payer: Self-pay

## 2021-11-15 ENCOUNTER — Other Ambulatory Visit: Payer: Self-pay | Admitting: Family Medicine

## 2021-11-15 DIAGNOSIS — E1136 Type 2 diabetes mellitus with diabetic cataract: Secondary | ICD-10-CM

## 2021-11-15 DIAGNOSIS — I10 Essential (primary) hypertension: Secondary | ICD-10-CM

## 2021-11-15 DIAGNOSIS — E7800 Pure hypercholesterolemia, unspecified: Secondary | ICD-10-CM | POA: Diagnosis not present

## 2021-11-16 ENCOUNTER — Other Ambulatory Visit: Payer: Medicare HMO

## 2021-11-16 LAB — LIPID PANEL
Cholesterol: 154 mg/dL (ref <200)
HDL: 70 mg/dL (ref >=40)
LDL Cholesterol (Calc): 68 mg/dL (calc)
Non-HDL Cholesterol (Calc): 84 mg/dL (calc) (ref <130)
Total CHOL/HDL Ratio: 2.2 (calc) (ref <5.0)
Triglycerides: 81 mg/dL (ref <150)

## 2021-11-16 LAB — HEMOGLOBIN A1C
Hgb A1c MFr Bld: 7.7 % of total Hgb — ABNORMAL HIGH (ref <5.7)
Mean Plasma Glucose: 174 mg/dL
eAG (mmol/L): 9.7 mmol/L

## 2021-11-16 LAB — COMPLETE METABOLIC PANEL WITH GFR
AG Ratio: 1.5 (calc) (ref 1.0–2.5)
ALT: 13 U/L (ref 9–46)
AST: 10 U/L (ref 10–35)
Albumin: 4 g/dL (ref 3.6–5.1)
Alkaline phosphatase (APISO): 47 U/L (ref 35–144)
BUN: 18 mg/dL (ref 7–25)
CO2: 28 mmol/L (ref 20–32)
Calcium: 9 mg/dL (ref 8.6–10.3)
Chloride: 107 mmol/L (ref 98–110)
Creat: 1.2 mg/dL (ref 0.70–1.35)
Globulin: 2.6 g/dL (calc) (ref 1.9–3.7)
Glucose, Bld: 134 mg/dL — ABNORMAL HIGH (ref 65–99)
Potassium: 3.9 mmol/L (ref 3.5–5.3)
Sodium: 141 mmol/L (ref 135–146)
Total Bilirubin: 0.4 mg/dL (ref 0.2–1.2)
Total Protein: 6.6 g/dL (ref 6.1–8.1)
eGFR: 66 mL/min/{1.73_m2} (ref >=60)

## 2021-11-20 ENCOUNTER — Other Ambulatory Visit: Payer: Self-pay | Admitting: Family Medicine

## 2021-11-20 DIAGNOSIS — E1136 Type 2 diabetes mellitus with diabetic cataract: Secondary | ICD-10-CM

## 2021-11-21 NOTE — Telephone Encounter (Signed)
Requested Prescriptions  Pending Prescriptions Disp Refills  . metFORMIN (GLUCOPHAGE-XR) 500 MG 24 hr tablet [Pharmacy Med Name: METFORMIN HYDROCHLORIDE ER 500 MG Tablet Extended Release 24 Hour] 90 tablet 1    Sig: TAKE 1 TABLET BY MOUTH [place]     Endocrinology:  Diabetes - Biguanides Failed - 11/20/2021  1:02 PM      Failed - AA eGFR in normal range and within 360 days    GFR, Est African American  Date Value Ref Range Status  08/17/2020 71 > OR = 60 mL/min/1.10m Final   GFR, Est Non African American  Date Value Ref Range Status  08/17/2020 62 > OR = 60 mL/min/1.767mFinal   eGFR  Date Value Ref Range Status  11/15/2021 66 > OR = 60 mL/min/1.7365minal    Comment:    The eGFR is based on the CKD-EPI 2021 equation. To calculate  the new eGFR from a previous Creatinine or Cystatin C result, go to https://www.kidney.org/professionals/ kdoqi/gfr%5Fcalculator   03/23/2021 52 (L) >59 mL/min/1.73 Final         Passed - Cr in normal range and within 360 days    Creat  Date Value Ref Range Status  11/15/2021 1.20 0.70 - 1.35 mg/dL Final         Passed - HBA1C is between 0 and 7.9 and within 180 days    Hemoglobin A1C  Date Value Ref Range Status  06/26/2019 7.3  Final   Hgb A1c MFr Bld  Date Value Ref Range Status  11/15/2021 7.7 (H) <5.7 % of total Hgb Final    Comment:    For someone without known diabetes, a hemoglobin A1c value of 6.5% or greater indicates that they may have  diabetes and this should be confirmed with a follow-up  test. . For someone with known diabetes, a value <7% indicates  that their diabetes is well controlled and a value  greater than or equal to 7% indicates suboptimal  control. A1c targets should be individualized based on  duration of diabetes, age, comorbid conditions, and  other considerations. . Currently, no consensus exists regarding use of hemoglobin A1c for diagnosis of diabetes for children. .  Renella CunasValid encounter within last 6 months    Recent Outpatient Visits          2 months ago Annual physical exam   [facility]   9 months ago 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   1 year ago [facility] physical [facility]   2 years ago 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   2 years ago [facility] systolic [facility]      Future Appointments            In 3 months Gollan, TimKathlene NovemberD [facility]   In 4 months  [facility]

## 2021-11-28 ENCOUNTER — Encounter: Payer: Self-pay | Admitting: Family Medicine

## 2021-11-28 ENCOUNTER — Other Ambulatory Visit: Payer: Self-pay

## 2021-11-28 ENCOUNTER — Ambulatory Visit (INDEPENDENT_AMBULATORY_CARE_PROVIDER_SITE_OTHER): Payer: Medicare HMO | Admitting: Family Medicine

## 2021-11-28 VITALS — Ht 68.0 in | Wt 158.0 lb

## 2021-11-28 DIAGNOSIS — E7800 Pure hypercholesterolemia, unspecified: Secondary | ICD-10-CM | POA: Diagnosis not present

## 2021-11-28 DIAGNOSIS — E1136 Type 2 diabetes mellitus with diabetic cataract: Secondary | ICD-10-CM

## 2021-11-28 MED ORDER — ONETOUCH ULTRA 2 W/DEVICE KIT: PACK | 0 refills | Status: AC

## 2021-11-28 MED ORDER — ONETOUCH ULTRA VI STRP: ORAL_STRIP | 12 refills | Status: DC

## 2021-11-28 MED ORDER — ONETOUCH ULTRASOFT LANCETS MISC: 12 refills | Status: DC

## 2021-11-28 NOTE — Progress Notes (Signed)
Virtual Visit via Telephone The purpose of this virtual visit is to provide medical care while limiting exposure to the novel coronavirus (COVID19) for both patient and office staff.  Consent was obtained for phone visit:  Yes.   Answered questions that patient had about telehealth interaction:  Yes.   I discussed the limitations, risks, security and privacy concerns of performing an evaluation and management service by telephone. I also discussed with the patient that there may be a patient responsible charge related to this service. The patient expressed understanding and agreed to proceed.  Patient Location: Home Provider Location: [facility] (Office)  Participants in virtual visit: - Patient: Russell [name] - CMA: Orinda Kenner, [place] - Provider: Dr Parks Ranger  ---------------------------------------------------------------------- Chief Complaint  Patient presents with   Diabetes    S: Reviewed CMA documentation. 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 Tolerating well w/o side-effects Currently on ACEi On new Statin since last visit 3 months. He is tolerating it well. On Pravastatin 160mnightly, no side effects or myalgia Labs now show improved LDL cholesterol from 103 down to 68. Needs new OneTouch ultra 2 glucometer, tests x 1 daily Lifestyle:  - Diet (tries to follow DM diet, occasional fried food) - Exercise (He has a gym pass and plans to resume exercise) - [facility], for glaucoma and cataracts Denies hypoglycemia, polyuria, visual changes, numbness or tingling.       Past Medical History:   Diagnosis Date   CAD (coronary artery disease)    05/07/2019 LHC showed nonobstructive CAD   Diabetes (HC[facility]   Frequent PVCs    Heart failure with reduced ejection fraction ([facility]   04/2019 EF 20-25%   Hypertension    Impotence of organic origin    NICM (nonischemic cardiomyopathy) (HCC)    HFrEF in setting of NICM   Pain in joint, shoulder region    Personal history of colonic polyps    Ventricular tachycardia    04/2019 in setting of cardiac arrest   Social History   Tobacco Use   Smoking status: Never   Smokeless tobacco: Never  Vaping Use   Vaping Use: Never used  Substance Use Topics   Alcohol use: Yes    Comment: occassional   Drug use: No    Current Outpatient Medications:    amiodarone (PACERONE) 200 MG tablet, Take 0.5 tablets (100 mg total) by mouth daily., Disp: 45 tablet, Rfl: 3   Blood Glucose Monitoring Suppl (ONE TOUCH ULTRA 2) w/Device KIT, Use to check blood sugar x 1 daily, Disp: 1 kit, Rfl: 0   brimonidine (ALPHAGAN) 0.2 % ophthalmic solution, Place 1 drop into both eyes 2 (two) times daily., Disp: , Rfl:    carvedilol (COREG) 25 MG tablet, Take 1 tablet (25 mg total) by mouth 2 (two) times daily., Disp: 180 tablet, Rfl: 3   Cholecalciferol (VITAMIN D3) 2000 units TABS, Take 1 capsule by mouth daily. , Disp: , Rfl:    dorzolamide-timolol (COSOPT) 22.3-6.8 MG/ML ophthalmic solution, Place 1 drop into both eyes 2 (two) times daily., Disp: , Rfl:    isosorbide mononitrate (IMDUR) 60 MG 24  hr tablet, Take 1 tablet (60 mg total) by mouth 2 (two) times daily., Disp: 180 tablet, Rfl: 3   Lancets (ONETOUCH ULTRASOFT) lancets, Use as instructed to check blood sugar up to x 1 daily, Disp: 100 each, Rfl: 12   latanoprost (XALATAN) 0.005 % ophthalmic solution, Place 1 drop into both eyes at bedtime. , Disp: , Rfl:    metFORMIN (GLUCOPHAGE-XR) 500 MG 24 hr tablet, TAKE 1 TABLET BY MOUTH EVERY DAY WITH BREAKFAST, Disp: 90 tablet, Rfl: 1   nitroGLYCERIN (NITROSTAT) 0.4 MG  SL tablet, DISSOLVE ONE TABLET UNDER THE TONGUE EVERY 5 MINUTES AS NEEDED FOR CHEST PAIN.  DO NOT EXCEED A TOTAL OF 3 DOSES IN 15 MINUTES, Disp: 25 tablet, Rfl: 0   ONETOUCH ULTRA test strip, Check blood sugar 1 x daily, Disp: 100 each, Rfl: 12   polyvinyl alcohol (LIQUIFILM TEARS) 1.4 % ophthalmic solution, Place 2 drops into both eyes every 8 (eight) hours., Disp: 15 mL, Rfl: 0   pravastatin (PRAVACHOL) 10 MG tablet, Take 1 tablet (10 mg total) by mouth at bedtime., Disp: 90 tablet, Rfl: 3   sacubitril-valsartan (ENTRESTO) 97-103 MG, Take 1 tablet by mouth 2 (two) times daily., Disp: 180 tablet, Rfl: 3   tamsulosin (FLOMAX) 0.4 MG CAPS capsule, Take 0.4 mg by mouth daily., Disp: , Rfl:    timolol (TIMOPTIC) 0.5 % ophthalmic solution, Place 1 drop into both eyes 2 (two) times daily. , Disp: , Rfl:    spironolactone (ALDACTONE) 25 MG tablet, Take 1 tablet (25 mg total) by mouth daily., Disp: 90 tablet, Rfl: 3  Depression screen [facility] 2/9 08/25/2021 04/12/2021 08/19/2020  Decreased Interest 0 0 0  Down, Depressed, Hopeless 0 0 0  PHQ - 2 Score 0 0 0  Altered sleeping 0 - -  Tired, decreased energy 0 - -  Change in appetite 0 - -  Feeling bad or failure about yourself  0 - -  Trouble concentrating 0 - -  Moving slowly or fidgety/restless 0 - -  Suicidal thoughts 0 - -  PHQ-9 Score 0 - -  Difficult doing work/chores Not difficult at all - -    GAD 7 : Generalized Anxiety Score 08/25/2021  Nervous, Anxious, on Edge 0  Control/stop worrying 0  Worry too much - different things 0  Trouble relaxing 0  Restless 0  Easily annoyed or irritable 0  Afraid - awful might happen 0  Total GAD 7 Score 0  Anxiety Difficulty Not difficult at all    -------------------------------------------------------------------------- O: No physical exam performed due to remote telephone encounter.  Lab results reviewed.  Recent Results (from the past 2160 hour(s))  CUP PACEART REMOTE DEVICE CHECK     Status: None    Collection Time: 11/07/21  3:36 AM  Result Value Ref Range   Date Time Interrogation Session [id number]    Pulse Generator Manufacturer MERM    Pulse Gen Model [id number] Visia AF MRI VR    Pulse Gen Serial Number [id number]    Clinic Name [place] Pulse Generator Type Implantable Cardiac Defibulator    Implantable Pulse Generator Implant Date [id number]    Implantable Lead Manufacturer [facility]    Implantable Lead Model 6935 Sprint Quattro Secure S MRI SureScan    Implantable Lead Serial Number [id number]    Implantable Lead Implant Date [id number]    Implantable Lead Location Detail 1 APEX    Implantable Lead Location [id number]    Lead Channel Setting Sensing Sensitivity  0.3 mV   Lead Channel Setting Pacing Pulse Width 0.4 ms   Lead Channel Setting Pacing Amplitude 2 V   Lead Channel Impedance Value 380 ohm   Lead Channel Impedance Value 304 ohm   Lead Channel Sensing Intrinsic Amplitude 14.625 mV   Lead Channel Sensing Intrinsic Amplitude 14.625 mV   Lead Channel Pacing Threshold Amplitude 0.75 V   Lead Channel Pacing Threshold Pulse Width 0.4 ms   HighPow Impedance 54 ohm   Battery Status OK    Battery Remaining Longevity 119 mo   Battery Voltage 3.01 V   Brady Statistic RV Percent Paced 0.23 %  COMPLETE METABOLIC PANEL WITH GFR     Status: Abnormal   Collection Time: 11/15/21  8:01 AM  Result Value Ref Range   Glucose, Bld 134 (H) 65 - 99 mg/dL    Comment: .            Fasting reference interval . For someone without known diabetes, a glucose value >125 mg/dL indicates that they may have diabetes and this should be confirmed with a follow-up test. .    BUN 18 7 - 25 mg/dL   Creat 1.20 0.70 - 1.35 mg/dL   eGFR 66 > OR = 60 mL/min/1.55m    Comment: The eGFR is based on the CKD-EPI 2021 equation. To calculate  the new eGFR from a previous Creatinine or Cystatin C result, go to https://www.kidney.org/professionals/ kdoqi/gfr%5Fcalculator     BUN/Creatinine Ratio NOT APPLICABLE 6 - 22 (calc)   Sodium 141 135 - 146 mmol/L   Potassium 3.9 3.5 - 5.3 mmol/L   Chloride 107 98 - 110 mmol/L   CO2 28 20 - 32 mmol/L   Calcium 9.0 8.6 - 10.3 mg/dL   Total Protein 6.6 6.1 - 8.1 g/dL   Albumin 4.0 3.6 - 5.1 g/dL   Globulin 2.6 1.9 - 3.7 g/dL (calc)   AG Ratio 1.5 1.0 - 2.5 (calc)   Total Bilirubin 0.4 0.2 - 1.2 mg/dL   Alkaline phosphatase (APISO) 47 35 - 144 U/L   AST 10 10 - 35 U/L   ALT 13 9 - 46 U/L  Lipid panel     Status: None   Collection Time: 11/15/21  8:01 AM  Result Value Ref Range   Cholesterol 154 <200 mg/dL   HDL 70 > OR = 40 mg/dL   Triglycerides 81 <150 mg/dL   LDL Cholesterol (Calc) 68 mg/dL (calc)    Comment: Reference range: <100 . Desirable range <100 mg/dL for primary prevention;   <70 mg/dL for patients with CHD or diabetic patients  with > or = 2 CHD risk factors. .Marland KitchenLDL-C is now calculated using the Martin-Hopkins  calculation, which is a validated novel method providing  better accuracy than the Friedewald equation in the  estimation of LDL-C.  MCresenciano Genreet al. JAnnamaria Helling 20370;488(89: 2061-2068  (http://education.[employer].com/faq/FAQ164)    Total CHOL/HDL Ratio 2.2 <5.0 (calc)   Non-HDL Cholesterol (Calc) 84 <130 mg/dL (calc)    Comment: For patients with diabetes plus 1 major ASCVD risk  factor, treating to a non-HDL-C goal of <100 mg/dL  (LDL-C of <70 mg/dL) is considered a therapeutic  option.   Hemoglobin A1c     Status: Abnormal   Collection Time: 11/15/21  8:01 AM  Result Value Ref Range   Hgb A1c MFr Bld 7.7 (H) <5.7 % of total Hgb    Comment: For someone without known diabetes, a hemoglobin A1c value of 6.5% or greater indicates  that they may have  diabetes and this should be confirmed with a follow-up  test. . For someone with known diabetes, a value <7% indicates  that their diabetes is well controlled and a value  greater than or equal to 7% indicates suboptimal  control. A1c  targets should be individualized based on  duration of diabetes, age, comorbid conditions, and  other considerations. . Currently, no consensus exists regarding use of hemoglobin A1c for diagnosis of diabetes for children. .    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 ([place]) - Primary   Relevant Medications   Lancets (ONETOUCH ULTRASOFT) lancets   Blood Glucose Monitoring Suppl (ONE TOUCH ULTRA 2) w/Device KIT   ONETOUCH ULTRA test strip   Other Visit Diagnoses     Elevated LDL cholesterol level          Diabetes Controlled within range A1c 7.7 (goal 7-8) Continue current therapy with Metformin and lifestyle Re order OneTouch Ultra 2 glucometer supplies to mail order  Elevated LDL / ASCVD Risk As diabetic patient, statin therapy is indicated with elevated ASCVD risk  The 10-year ASCVD risk score (Arnett DK, et al., 2019) is: 33.4%  Last visit 3 month ago, started Pravastatin 83m nightly. He has done well, tolerating w/o issue.  Lab shows normal CMET and improved LDL down to 68 (from >100), at goal now. Continue statin for ASCVD risk reduction.  Meds ordered this encounter  Medications   Lancets (ONETOUCH ULTRASOFT) lancets    Sig: Use as instructed to check blood sugar up to x 1 daily    Dispense:  100 each    Refill:  12   Blood Glucose Monitoring Suppl (ONE TOUCH ULTRA 2) w/Device KIT    Sig: Use to check blood sugar x 1 daily    Dispense:  1 kit    Refill:  0    OneTouch ultra 2   ONETOUCH ULTRA test strip    Sig: Check blood sugar 1 x daily    Dispense:  100 each    Refill:  12    Follow-up: - Return in 6 months for DM A1c  Patient verbalizes understanding with the above medical recommendations including the limitation of remote medical advice.  Specific  follow-up and call-back criteria were given for patient to follow-up or seek medical care more urgently if needed.   - Time spent in direct consultation with patient on phone: 7 minutes   ANobie Putnam DRagsdaleGroup 11/28/2021, 10:01 AM

## 2021-11-28 NOTE — Patient Instructions (Addendum)
Keep on cholesterol med, it is working well  Ordered onetouch ultra glucometer  Please schedule a Follow-up Appointment to: Return in about 6 months (around 05/29/2022) for 6 month DM A1c.  If you have any other questions or concerns, please feel free to call the office or send a message through MyChart. You may also schedule an earlier appointment if necessary.  Additionally, you may be receiving a survey about your experience at our office within a few days to 1 week by e-mail or mail. We value your feedback.  Saralyn Pilar, DO [facility], [place]

## 2021-12-05 ENCOUNTER — Ambulatory Visit (INDEPENDENT_AMBULATORY_CARE_PROVIDER_SITE_OTHER): Payer: Medicare HMO

## 2021-12-05 DIAGNOSIS — I5022 Chronic systolic (congestive) heart failure: Secondary | ICD-10-CM

## 2021-12-05 DIAGNOSIS — Z95810 Presence of automatic (implantable) cardiac defibrillator: Secondary | ICD-10-CM

## 2021-12-07 NOTE — Progress Notes (Signed)
EPIC Encounter for ICM Monitoring  Patient Name: Russell [name]. is a 70 y.o. male Date: 12/07/2021 Primary Care Physican: Smitty Cords, DO Primary Cardiologist: Mariah Milling Electrophysiologist: Allred 12/07/2021 Weight: 158-160 lbs                                                           Spoke with patient and heart failure questions reviewed.  Pt asymptomatic for fluid accumulation and feeling well.   Optivol thoracic impedance suggesting normal fluid levels.    Prescribed: Spironolactone 25 mg take 1 tablet daily   Labs: 03/23/2021 Creatinine 1.48, BUN 15, Potassium 4.7, Sodium 140, GFR 52 A complete set of results can be found in Results Review.   Recommendations:  No changes and encouraged to call if experiencing any fluid symptoms.   Follow-up plan: ICM clinic phone appointment on 01/09/2022.   91 day device clinic remote transmission 02/06/2022.    EP/Cardiology Next Office Visit:  Recall 03/19/2022 with [name], PA.   Recall 02/21/2022 with Dr Mariah Milling   3 month ICM trend: 12/05/2021.    12-14 Month ICM trend:       [name], RN 12/07/2021 2:53 PM

## 2021-12-19 ENCOUNTER — Other Ambulatory Visit: Payer: Self-pay | Admitting: Nurse Practitioner

## 2021-12-19 ENCOUNTER — Telehealth: Payer: Self-pay | Admitting: Nurse Practitioner

## 2021-12-19 MED ORDER — SACUBITRIL-VALSARTAN 97-103 MG PO TABS: 1.0000 | ORAL_TABLET | Freq: Two times a day (BID) | ORAL | 1 refills | Status: DC

## 2021-12-19 NOTE — Telephone Encounter (Signed)
° °  Pt on entresto 97-103 BID.  He is awaiting refill through mail order, but this has not yet arrived.  He is requesting 2 wks worth of entresto to get by while awaiting mail order refill.  I have sent entresto 97-103 1 tab bid, # 28, 1 ref[facility] [place], [place].  Caller verbalized understanding and was grateful for the call back.  Nicolasa Ducking, NP 12/19/2021, 1:32 PM

## 2021-12-29 ENCOUNTER — Inpatient Hospital Stay: Admit: 2021-12-29 | Discharge: 2021-12-29 | Payer: PRIVATE HEALTH INSURANCE | Primary: Internal Medicine

## 2021-12-29 NOTE — Telephone Encounter (Signed)
Patient notified Delene Loll has been approved through the patient assistance program until 12/24/2022 at no cost to the patient.

## 2022-01-09 ENCOUNTER — Ambulatory Visit (INDEPENDENT_AMBULATORY_CARE_PROVIDER_SITE_OTHER): Payer: Medicare HMO

## 2022-01-09 DIAGNOSIS — Z95810 Presence of automatic (implantable) cardiac defibrillator: Secondary | ICD-10-CM | POA: Diagnosis not present

## 2022-01-09 DIAGNOSIS — I5022 Chronic systolic (congestive) heart failure: Secondary | ICD-10-CM | POA: Diagnosis not present

## 2022-01-12 NOTE — Progress Notes (Signed)
EPIC Encounter for ICM Monitoring  Patient Name: Russell [name]. is a 71 y.o. male Date: 01/12/2022 Primary Care Physican: Olin Hauser, DO Primary Cardiologist: Rockey Situ Electrophysiologist: Allred 12/07/2021 Weight: 158-160 lbs                                                           Spoke with patient and heart failure questions reviewed.  Pt asymptomatic for fluid accumulation.  Reports feeling well at this time and voices no complaints.    Optivol thoracic impedance suggesting normal fluid levels.    Prescribed: Spironolactone 25 mg take 1 tablet daily   Labs: 11/15/2021 Creatinine 1.20, BUN 18, Potassium 3.9, Sodium 141, GFR 66 03/23/2021 Creatinine 1.48, BUN 15, Potassium 4.7, Sodium 140, GFR 52 A complete set of results can be found in Results Review.   Recommendations:  No changes and encouraged to call if experiencing any fluid symptoms.   Follow-up plan: ICM clinic phone appointment on 02/13/2022.   91 day device clinic remote transmission 02/06/2022.    EP/Cardiology Next Office Visit:  Recall 03/19/2022 with Oda Kilts, PA.   02/21/2022 with Dr Rockey Situ    Copy of ICM check sent to Dr. Rayann Heman.   3 month ICM trend: 01/09/2022.    12-14 Month ICM trend:     Rosalene Billings, RN 01/12/2022 8:07 AM

## 2022-01-18 ENCOUNTER — Telehealth: Payer: Self-pay | Admitting: Family Medicine

## 2022-01-18 DIAGNOSIS — E1136 Type 2 diabetes mellitus with diabetic cataract: Secondary | ICD-10-CM

## 2022-01-18 NOTE — Telephone Encounter (Signed)
Medication Refill - Medication: Ultra touch 33 gauge lancets  Has the patient contacted their pharmacy? yes (Agent: If no, request that the patient contact the pharmacy for the refill. If patient does not wish to contact the pharmacy document the reason why and proceed with request.) (Agent: If yes, when and what did the pharmacy advise?)contact pcp  Preferred Pharmacy (with phone number or street name): [facility] - [place], [place] - [address] Phone:  [phone]  Fax:  [phone]     Has the patient been seen for an appointment in the last year OR does the patient have an upcoming appointment? yes  Agent: Please be advised that RX refills may take up to 3 business days. We ask that you follow-up with your pharmacy.

## 2022-01-18 NOTE — Telephone Encounter (Signed)
Requested medication (s) are due for refill today: No, has newer rx  Requested medication (s) are on the active medication list: yes  Future visit scheduled: 6/7, was just seen 11/28/21  Notes to clinic:  has newer rx at same pharm, please assess.  Requested Prescriptions  Pending Prescriptions Disp Refills   Lancets (ONETOUCH ULTRASOFT) lancets 100 each 12    Sig: Use as instructed to check blood sugar up to x 1 daily     Endocrinology: Diabetes - Testing Supplies Passed - 01/18/2022  5:50 PM      Passed - Valid encounter within last 12 months    Recent Outpatient Visits           1 month ago 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 [facility])   [facility] Althea Charon, Netta Neat, DO   4 months ago Annual physical exam   [facility] Smitty Cords, DO   11 months ago 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 [facility])   [facility] Smitty Cords, DO   1 year ago Annual physical exam   [facility] Smitty Cords, DO   2 years ago 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 [facility])   [facility] Smitty Cords, DO       Future Appointments             In 1 month Gollan, Tollie Pizza, MD [facility], [facility]   In 3 months  [facility], [facility]   In 4 months Althea Charon, Netta Neat, DO [facility], [facility]

## 2022-01-19 MED ORDER — ONETOUCH ULTRASOFT LANCETS MISC: 12 refills | Status: DC

## 2022-01-19 NOTE — Telephone Encounter (Signed)
Update 1/26 6:08 pm  [facility]  [facility]armacy requesting added note Contact if needed  1 [phone]   Please note: Make sure this is the OneTouch Delica Plus Lancets 33G  Script sent today at 12:49  pm was   incorrect. This script in this documentation is correct .

## 2022-01-20 MED ORDER — ONETOUCH DELICA LANCETS 33G MISC: 12 refills | Status: DC

## 2022-01-20 NOTE — Addendum Note (Signed)
Addended by: Olin Hauser on: 01/20/2022 01:11 PM   Modules accepted: Orders

## 2022-01-20 NOTE — Telephone Encounter (Signed)
Completed. New orders for correct OneTouch Delica 0000000 lancet sent  Nobie Putnam, [facility] 01/20/2022, 2:06 PM

## 2022-01-20 NOTE — Telephone Encounter (Signed)
Cynthnia with [facility] pharmacy is calling to report that the script for Lancets [facility] ULTRASOFT) lancets [[id number]]  is not what the patient is in need of.  Pt is requesting one touch delica plus lancets 33g.   Please sent in today to [facility]  [phone]

## 2022-01-26 ENCOUNTER — Telehealth: Payer: Self-pay | Admitting: Cardiovascular Disease

## 2022-01-26 NOTE — Telephone Encounter (Signed)
Pt c/o BP issue: STAT if pt c/o blurred vision, one-sided weakness or slurred speech  1. What are your last 5 BP readings? 130/80 is baseline . Now Trending high today 167/98 160/105   2. Are you having any other symptoms (ex. Dizziness, headache, blurred vision, passed out)? Headache   3. What is your BP issue? Trending up about 2 weeks   Scheduled per patient request 2-6 Gollan at 9 am .

## 2022-01-26 NOTE — Telephone Encounter (Signed)
Return call to Russell [name] regarding BP and headaches. Russell [name] reports his baseline BP is typically 130s SBP, past 2 weeks have been seeing some higher numbers intermittently, SBP>160  167/98 160/105  Pt takes his cardiac meds around 9 am Amiodarone Carvedilol IMDUR Entresto  Reports takes BP shortly afterwards. Advised to take his BP 2 hours after medications and while he has been at rest for at least 10-15 mins. Record those umbers along with HR. Also suggest taking his BP at night when he takes his evening IMDUR and Carvedilol at 8 pm before bed.   For headache increase fluid intake and try OTC Tylenol, also reduce sodium and fried foods as that can contribute to headaches and HTN.   Advised bring his BP/Hr log with him to his appt Monday, 2/6 with Dr. Rockey Situ so those numbers can be reviewed, from there we can discuss med changes/adjustments. Russell [name] vebralized understanding, Otherwise all questions were address and no additional concerns at this time. Russell [name] thankful for the return call and advice. Agreeable to plan, will call back for anything further.

## 2022-01-29 NOTE — Progress Notes (Signed)
Cardiology Office Note  Date:  01/30/2022   ID:  Russell Love., DOB [birth year], MRN [id number]  PCP:  Olin Hauser, DO   Chief Complaint  Patient presents with   Hypertension    Patient c/o elevated blood pressure with more in the evenings. Medications reviewed by the patient verbally.     HPI:  Russell [name] is a 71 year old gentleman with a history of  diabetes,  Hypertension, who initially presented for abnormal EKG. he had a colonoscopy. During the procedure, noted to have abnormal telemetry. EKG was performed that showed anterolateral wall abnormality.  echocardiogram showed depressed ejection fraction 25-30%.   2014 pharmacologic Myoview suggested mid to distal anterior wall perfusion defect concerning for ischemia. started on beta blocker and ACE inhibitor cardiac catheterization given the perfusion defect and severely depressed ejection fraction. -- no significant CAD, ejection fraction had improved up to 40%. Echo 9/20: EF is 30 to 35% He presents  for routine follow-up of his nonischemic cardiomyopathy and hypertension, has ICD  LOV 8/22 On today's visit reports blood pressure running high more in the evenings Brings numbers in with him today ranging from 099 systolic up to 833A On prior office visit taking Imdur 60 daily twice daily Previously not taking spironolactone secondary to side effects  On today's visit reports taking carvedilol twice a day, Entresto twice a day, isosorbide 60 twice a day Uncertain if he is on spironolactone Not on diuretic, denies leg swelling shortness of breath abdominal bloating  Prostate issues, urinary hesitancy  A1c 7.7  ICD download reviewed OptiVol running normal range Active, not going to the gym  Prior echocardiograms Ejection fraction 20 to 25%,  Follow-up echo up to 30 to 35% September 2020 Declining repeat study on prior clinic visit  Followed by EP in [facility]  EKG personally reviewed by myself on todays  visit Shows  normal sinus rhythm rate 62 bpm anterolateral T wave abnormality  unchanges  Other past medical history reviewed On 05/03/2019,  playing baseball,  developed chest pain, collapsed,  unresponsive. Bystander started CPR   in ventricular fibrillation and required 5 shocks while in the field with subsequent ROSC.  He was intubated  Total downtime estimated at approximately 5 minutes. Cooling protocol.  Troponin peaked at 0.69.   sustained VT on 05/06/2019 with successful defibrillation in the hospital  started on Amiodarone infusion  05/07/2019,  right and left heart catheterization with mild luminal irregularities   severely reduced LVEF (20-25%) with global hypokinesis  ICD was placed  PMH:   has a past medical history of CAD (coronary artery disease), Diabetes ([place]), Frequent PVCs, Heart failure with reduced ejection fraction ([place]), Hypertension, Impotence of organic origin, NICM (nonischemic cardiomyopathy) ([place]), Pain in joint, shoulder region, Personal history of colonic polyps, and Ventricular tachycardia.  PSH:    Past Surgical History:  Procedure Laterality Date   CARDIAC CATHETERIZATION  11/17/2013   COLONOSCOPY  2009   ICD IMPLANT N/A 05/08/2019   Procedure: ICD IMPLANT;  Surgeon: Thompson Grayer, MD;  Location: [facility];  Service: Cardiovascular;  Laterality: N/A;   RIGHT/LEFT HEART CATH AND CORONARY ANGIOGRAPHY N/A 05/07/2019   Procedure: RIGHT/LEFT HEART CATH AND CORONARY ANGIOGRAPHY;  Surgeon: Nelva Bush, MD;  Location: [facility] [place] [facility];  Service: Cardiovascular;  Laterality: N/A;    Current Outpatient Medications  Medication Sig Dispense Refill   amiodarone (PACERONE) 200 MG tablet Take 0.5 tablets (100 mg total) by mouth daily. 45 tablet 3   Blood Glucose Monitoring  Suppl (ONE TOUCH ULTRA 2) w/Device KIT Use to check blood sugar x 1 daily 1 kit 0   brimonidine (ALPHAGAN) 0.2 % ophthalmic solution Place 1 drop into both eyes 2 (two) times daily.      carvedilol (COREG) 25 MG tablet Take 1 tablet (25 mg total) by mouth 2 (two) times daily. 180 tablet 3   Cholecalciferol (VITAMIN D3) 2000 units TABS Take 1 capsule by mouth daily.      dorzolamide-timolol (COSOPT) 22.3-6.8 MG/ML ophthalmic solution Place 1 drop into both eyes 2 (two) times daily.     isosorbide mononitrate (IMDUR) 60 MG 24 hr tablet Take 1 tablet (60 mg total) by mouth 2 (two) times daily. 180 tablet 3   latanoprost (XALATAN) 0.005 % ophthalmic solution Place 1 drop into both eyes at bedtime.      metFORMIN (GLUCOPHAGE-XR) 500 MG 24 hr tablet TAKE 1 TABLET BY MOUTH EVERY DAY WITH BREAKFAST 90 tablet 1   nitroGLYCERIN (NITROSTAT) 0.4 MG SL tablet DISSOLVE ONE TABLET UNDER THE TONGUE EVERY 5 MINUTES AS NEEDED FOR CHEST PAIN.  DO NOT EXCEED A TOTAL OF 3 DOSES IN 15 MINUTES 25 tablet 0   OneTouch Delica Lancets 23T MISC Use to check blood sugar up to 1 time daily 100 each 12   ONETOUCH ULTRA test strip Check blood sugar 1 x daily 100 each 12   polyvinyl alcohol (LIQUIFILM TEARS) 1.4 % ophthalmic solution Place 2 drops into both eyes every 8 (eight) hours. 15 mL 0   pravastatin (PRAVACHOL) 10 MG tablet Take 1 tablet (10 mg total) by mouth at bedtime. 90 tablet 3   sacubitril-valsartan (ENTRESTO) 97-103 MG Take 1 tablet by mouth 2 (two) times daily. 28 tablet 1   spironolactone (ALDACTONE) 25 MG tablet Take 1 tablet (25 mg total) by mouth daily. 90 tablet 3   tamsulosin (FLOMAX) 0.4 MG CAPS capsule Take 0.4 mg by mouth daily.     timolol (TIMOPTIC) 0.5 % ophthalmic solution Place 1 drop into both eyes 2 (two) times daily.      No current facility-administered medications for this visit.    Allergies:   Patient has no known allergies.   Social History:  The patient  reports that he has never smoked. He has never used smokeless tobacco. He reports current alcohol use. He reports that he does not use drugs.   Family History:   family history includes Colon cancer in his father;  Diabetes in his mother; Hypertension in his mother.    Review of Systems: Review of Systems  Constitutional: Negative.   HENT: Negative.    Respiratory: Negative.    Cardiovascular: Negative.   Gastrointestinal: Negative.   Musculoskeletal: Negative.   Neurological: Negative.   Psychiatric/Behavioral: Negative.    All other systems reviewed and are negative.  PHYSICAL EXAM: VS:  BP (!) 150/86 (BP Location: Left Arm, Patient Position: Sitting, Cuff Size: Normal)    Pulse 63    Ht $R'5\' 8"'Ow$  (1.727 m)    Wt 161 lb 6 oz (73.2 kg)    SpO2 98%    BMI 24.54 kg/m  , BMI Body mass index is 24.54 kg/m. Constitutional:  oriented to person, place, and time. No distress.  HENT:  Head: Grossly normal Eyes:  no discharge. No scleral icterus.  Neck: No JVD, no carotid bruits  Cardiovascular: Regular rate and rhythm, no murmurs appreciated Pulmonary/Chest: Clear to auscultation bilaterally, no wheezes or rails Abdominal: Soft.  no distension.  no tenderness.  Musculoskeletal: Normal  range of motion Neurological:  normal muscle tone. Coordination normal. No atrophy Skin: Skin warm and dry Psychiatric: normal affect, pleasant  Recent Labs: 03/23/2021: TSH 1.010 08/18/2021: Hemoglobin 13.1; Platelets 178 11/15/2021: ALT 13; BUN 18; Creat 1.20; Potassium 3.9; Sodium 141    Lipid Panel Lab Results  Component Value Date   CHOL 154 11/15/2021   HDL 70 11/15/2021   LDLCALC 68 11/15/2021   TRIG 81 11/15/2021    Wt Readings from Last 3 Encounters:  01/30/22 161 lb 6 oz (73.2 kg)  11/28/21 158 lb (71.7 kg)  08/25/21 159 lb (72.1 kg)     ASSESSMENT AND PLAN:  Congestive dilated cardiomyopathy (HCC)  Continue Coreg and Entresto,  Imdur up to 90 twice daily We will add Cardura 1 twice daily given reported prostate issues Labs March 2021 and August 2022 amiodarone surveillance OptiVol low Stressed importance of aggressive blood pressure control He will check to see if he is on spironolactone  25 daily Other options include going up, Cardura, starting hydralazine  Nonischemic cardiomyopathy Nonobstructive coronary disease by prior catheterization 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 Ejection fraction 30 to 35% Medication changes as above He will talk with primary care to see if Jardiance/Farxiga a good option Would likely be beneficial with his cardiac history  Controlled type 2 diabetes mellitus without complication, unspecified long term insulin use status (HCC) --  A1c 7.7 Consider Jardiance/Farxiga  VF Defibrillator in place, no recent events Download reviewed with him, higher OptiVol   Total encounter time more than 30 minutes  Greater than 50% was spent in counseling and coordination of care with the patient    Orders Placed This Encounter  Procedures   EKG 12-Lead      Signed, Esmond Plants, M.D., Ph.D. 01/30/2022  Victoria Vera, [place]

## 2022-01-30 ENCOUNTER — Other Ambulatory Visit: Payer: Self-pay

## 2022-01-30 ENCOUNTER — Encounter: Payer: Self-pay | Admitting: Cardiovascular Disease

## 2022-01-30 ENCOUNTER — Ambulatory Visit (INDEPENDENT_AMBULATORY_CARE_PROVIDER_SITE_OTHER): Payer: Medicare HMO | Admitting: Cardiovascular Disease

## 2022-01-30 VITALS — BP 150/86 | HR 63 | Ht 68.0 in | Wt 161.4 lb

## 2022-01-30 DIAGNOSIS — I10 Essential (primary) hypertension: Secondary | ICD-10-CM

## 2022-01-30 DIAGNOSIS — E1136 Type 2 diabetes mellitus with diabetic cataract: Secondary | ICD-10-CM | POA: Diagnosis not present

## 2022-01-30 DIAGNOSIS — I4901 Ventricular fibrillation: Secondary | ICD-10-CM

## 2022-01-30 DIAGNOSIS — I428 Other cardiomyopathies: Secondary | ICD-10-CM

## 2022-01-30 DIAGNOSIS — I5022 Chronic systolic (congestive) heart failure: Secondary | ICD-10-CM

## 2022-01-30 DIAGNOSIS — Z95810 Presence of automatic (implantable) cardiac defibrillator: Secondary | ICD-10-CM | POA: Diagnosis not present

## 2022-01-30 DIAGNOSIS — I110 Hypertensive heart disease with heart failure: Secondary | ICD-10-CM

## 2022-01-30 DIAGNOSIS — I7090 Unspecified atherosclerosis: Secondary | ICD-10-CM | POA: Diagnosis not present

## 2022-01-30 DIAGNOSIS — I472 Ventricular tachycardia, unspecified: Secondary | ICD-10-CM | POA: Diagnosis not present

## 2022-01-30 MED ORDER — DOXAZOSIN MESYLATE 1 MG PO TABS: 1.0000 mg | ORAL_TABLET | Freq: Two times a day (BID) | ORAL | 3 refills | Status: DC

## 2022-01-30 MED ORDER — DOXAZOSIN MESYLATE 1 MG PO TABS: 1.0000 mg | ORAL_TABLET | Freq: Two times a day (BID) | ORAL | 0 refills | Status: DC

## 2022-01-30 MED ORDER — ISOSORBIDE MONONITRATE ER 60 MG PO TB24: 90.0000 mg | ORAL_TABLET | Freq: Two times a day (BID) | ORAL | 0 refills | Status: DC

## 2022-01-30 MED ORDER — ISOSORBIDE MONONITRATE ER 60 MG PO TB24: 90.0000 mg | ORAL_TABLET | Freq: Two times a day (BID) | ORAL | 3 refills | Status: DC

## 2022-01-30 NOTE — Patient Instructions (Addendum)
Medication Instructions:  Please increase  isosorbide up to 90 mg twice a day Please Start  cardura 1 mg twice a day  Ask Dr. Raliegh Ip  about jardiance/farxiga  If you need a refill on your cardiac medications before your next appointment, please call your pharmacy.   Lab work: No new labs needed  Testing/Procedures: No new testing needed  Follow-Up: At [facility], you and your health needs are our priority.  As part of our continuing mission to provide you with exceptional heart care, we have created designated Provider Care Teams.  These Care Teams include your primary Cardiologist (physician) and Advanced Practice Providers (APPs -  Physician Assistants and Nurse Practitioners) who all work together to provide you with the care you need, when you need it.  You will need a follow up appointment in 6 months, APP ok  Providers on your designated Care Team:   Murray Hodgkins, NP Christell Faith, PA-C Cadence Kathlen Mody, [place]  COVID-19 Vaccine Information can be found at: ShippingScam.co.uk For questions related to vaccine distribution or appointments, please email vaccine@[email or web address] .[email or web address] or call 9188369246.

## 2022-02-06 ENCOUNTER — Ambulatory Visit (INDEPENDENT_AMBULATORY_CARE_PROVIDER_SITE_OTHER): Payer: Medicare HMO

## 2022-02-06 DIAGNOSIS — I428 Other cardiomyopathies: Secondary | ICD-10-CM | POA: Diagnosis not present

## 2022-02-06 LAB — CUP PACEART REMOTE DEVICE CHECK
Battery Remaining Longevity: 117 mo
Battery Voltage: 3.02 V
Brady Statistic RV Percent Paced: 0.15 %
Date Time Interrogation Session: 20230213022603
HighPow Impedance: 59 Ohm
Implantable Lead Implant Date: 20200514
Implantable Lead Location: 753860
Implantable Lead Model: 6935
Implantable Pulse Generator Implant Date: 20200514
Lead Channel Impedance Value: 342 Ohm
Lead Channel Impedance Value: 437 Ohm
Lead Channel Pacing Threshold Amplitude: 0.625 V
Lead Channel Pacing Threshold Pulse Width: 0.4 ms
Lead Channel Sensing Intrinsic Amplitude: 10.625 mV
Lead Channel Sensing Intrinsic Amplitude: 10.625 mV
Lead Channel Setting Pacing Amplitude: 2 V
Lead Channel Setting Pacing Pulse Width: 0.4 ms
Lead Channel Setting Sensing Sensitivity: 0.3 mV

## 2022-02-08 NOTE — Progress Notes (Signed)
Remote ICD transmission.   

## 2022-02-13 ENCOUNTER — Ambulatory Visit (INDEPENDENT_AMBULATORY_CARE_PROVIDER_SITE_OTHER): Payer: Medicare HMO

## 2022-02-13 DIAGNOSIS — Z95810 Presence of automatic (implantable) cardiac defibrillator: Secondary | ICD-10-CM | POA: Diagnosis not present

## 2022-02-13 DIAGNOSIS — I5022 Chronic systolic (congestive) heart failure: Secondary | ICD-10-CM | POA: Diagnosis not present

## 2022-02-15 NOTE — Progress Notes (Signed)
EPIC Encounter for ICM Monitoring  Patient Name: Russell [name]. is a 71 y.o. male Date: 02/15/2022 Primary Care Physican: Smitty Cords, DO Primary Cardiologist: Mariah Milling Electrophysiologist: Allred 02/15/2022 Weight: 158-160 lbs                                                           Spoke with patient and heart failure questions reviewed.  Pt asymptomatic for fluid accumulation.  Reports feeling well at this time and voices no complaints.    Optivol thoracic impedance suggesting normal fluid levels.    Prescribed: Spironolactone 25 mg take 1 tablet daily   Labs: 11/15/2021 Creatinine 1.20, BUN 18, Potassium 3.9, Sodium 141, GFR 66 03/23/2021 Creatinine 1.48, BUN 15, Potassium 4.7, Sodium 140, GFR 52 A complete set of results can be found in Results Review.   Recommendations:  No changes and encouraged to call if experiencing any fluid symptoms.   Follow-up plan: ICM clinic phone appointment on 03/20/2022.   91 day device clinic remote transmission 05/08/2022.    EP/Cardiology Next Office Visit:  03/20/2022 with [name], PA.   02/21/2022 with Dr Mariah Milling     Copy of ICM check sent to Dr. Johney Frame.   3 month ICM trend: 02/13/2022.    12-14 Month ICM trend:     [name], RN 02/15/2022 5:00 PM

## 2022-02-20 ENCOUNTER — Telehealth: Admit: 2022-02-20 | Payer: PRIVATE HEALTH INSURANCE | Attending: Medical | Primary: Internal Medicine

## 2022-02-20 NOTE — Telephone Encounter
Patient is calling in as he received a bill from [place] for a PSA lab and patient is not sure if the bill is from [facility] billing or if the insurance is requesting another code to be submitted by the provider. Asked patient to explain the bill in full, patient states there was no information just the amount billed to him and where he had his labs drawn. Spoke with Alba Cory in billing @ [facility] and she stated medicare did deny the coverage for the lab drawn on 10/13/2021. Alba Cory explained that the provider would have to resubmit the coding and sign. The resubmission can be faxed to number below with the following attached to it. Thank you. Fax #: [phone] (Attention Coding) Claim: [id number]

## 2022-02-21 ENCOUNTER — Ambulatory Visit: Payer: Medicare HMO | Admitting: Cardiovascular Disease

## 2022-02-22 DIAGNOSIS — H402213 Chronic angle-closure glaucoma, right eye, severe stage: Secondary | ICD-10-CM | POA: Diagnosis not present

## 2022-03-20 ENCOUNTER — Ambulatory Visit (INDEPENDENT_AMBULATORY_CARE_PROVIDER_SITE_OTHER): Payer: Medicare HMO

## 2022-03-20 ENCOUNTER — Encounter: Payer: Medicare HMO | Admitting: Student

## 2022-03-20 DIAGNOSIS — Z95810 Presence of automatic (implantable) cardiac defibrillator: Secondary | ICD-10-CM | POA: Diagnosis not present

## 2022-03-20 DIAGNOSIS — I5022 Chronic systolic (congestive) heart failure: Secondary | ICD-10-CM | POA: Diagnosis not present

## 2022-03-20 NOTE — Progress Notes (Signed)
EPIC Encounter for ICM Monitoring ? ?Patient Name: Russell [name]. is a 71 y.o. male ?Date: 03/20/2022 ?Primary Care Physican: Smitty Cords, DO ?Primary Cardiologist: Mariah Milling ?Electrophysiologist: Allred ?02/15/2022 Weight: 158-160 lbs  ?                                                         ?Spoke with patient and heart failure questions reviewed.  Pt asymptomatic for fluid accumulation.  Reports feeling well at this time and voices no complaints.  ?  ?Optivol thoracic impedance suggesting normal fluid levels.  ?  ?Prescribed: ?Spironolactone 25 mg take 1 tablet daily ?  ?Labs: ?11/15/2021 Creatinine 1.20, BUN 18, Potassium 3.9, Sodium 141, GFR 66 ?03/23/2021 Creatinine 1.48, BUN 15, Potassium 4.7, Sodium 140, GFR 52 ?A complete set of results can be found in Results Review. ?  ?Recommendations:  No changes and encouraged to call if experiencing any fluid symptoms. ?  ?Follow-up plan: ICM clinic phone appointment on 04/24/2022.   91 day device clinic remote transmission 05/08/2022.  ?  ?EP/Cardiology Next Office Visit:  04/05/2022 with [name], PA.   08/08/2022 with Dr Mariah Milling   ?  ?Copy of ICM check sent to Dr. Johney Frame.  ? ?3 month ICM trend: 03/20/2022. ? ? ? ?12-14 Month ICM trend:  ? ? ? ?[name], RN ?03/20/2022 ?11:10 AM ? ?

## 2022-03-21 ENCOUNTER — Other Ambulatory Visit: Payer: Self-pay | Admitting: Internal Medicine

## 2022-03-21 DIAGNOSIS — R3914 Feeling of incomplete bladder emptying: Secondary | ICD-10-CM | POA: Diagnosis not present

## 2022-03-21 DIAGNOSIS — N5201 Erectile dysfunction due to arterial insufficiency: Secondary | ICD-10-CM | POA: Diagnosis not present

## 2022-03-21 DIAGNOSIS — Z125 Encounter for screening for malignant neoplasm of prostate: Secondary | ICD-10-CM | POA: Diagnosis not present

## 2022-03-21 DIAGNOSIS — N401 Enlarged prostate with lower urinary tract symptoms: Secondary | ICD-10-CM | POA: Diagnosis not present

## 2022-03-21 MED ORDER — AMIODARONE HCL 200 MG PO TABS: 100.0000 mg | ORAL_TABLET | Freq: Every day | ORAL | 0 refills | Status: DC

## 2022-03-22 DIAGNOSIS — N401 Enlarged prostate with lower urinary tract symptoms: Secondary | ICD-10-CM | POA: Diagnosis not present

## 2022-03-22 DIAGNOSIS — R3914 Feeling of incomplete bladder emptying: Secondary | ICD-10-CM | POA: Diagnosis not present

## 2022-03-22 DIAGNOSIS — Z125 Encounter for screening for malignant neoplasm of prostate: Secondary | ICD-10-CM | POA: Diagnosis not present

## 2022-03-22 DIAGNOSIS — N5201 Erectile dysfunction due to arterial insufficiency: Secondary | ICD-10-CM | POA: Diagnosis not present

## 2022-03-23 DIAGNOSIS — R972 Elevated prostate specific antigen [PSA]: Secondary | ICD-10-CM | POA: Diagnosis not present

## 2022-03-24 ENCOUNTER — Emergency Department: Admit: 2022-03-24 | Payer: MEDICARE | Primary: Internal Medicine

## 2022-03-24 ENCOUNTER — Inpatient Hospital Stay: Admit: 2022-03-24 | Discharge: 2022-03-25 | Payer: MEDICARE

## 2022-03-24 DIAGNOSIS — N453 Epididymo-orchitis: Secondary | ICD-10-CM

## 2022-03-24 MED ORDER — KETOROLAC 30 MG/ML (1 ML) INJECTION SOLUTION
301 mg/mL (1 mL) | Freq: Once | INTRAMUSCULAR | Status: CP
Start: 2022-03-24 — End: ?
  Administered 2022-03-25: 04:00:00 30 mL via INTRAMUSCULAR

## 2022-03-24 MED ORDER — MORPHINE 4 MG/ML INTRAVENOUS SOLUTION
4 mg/mL | Freq: Once | INTRAVENOUS | Status: CP
Start: 2022-03-24 — End: ?
  Administered 2022-03-25: 01:00:00 4 mL via INTRAVENOUS

## 2022-03-24 MED ORDER — LABETALOL 5 MG/ML INTRAVENOUS SOLUTION
5 mg/mL | Freq: Once | INTRAVENOUS | Status: CP
Start: 2022-03-24 — End: ?
  Administered 2022-03-25: 03:00:00 5 mL via INTRAVENOUS

## 2022-03-24 MED ORDER — LEVOFLOXACIN 500 MG TABLET
500 mg | ORAL_TABLET | Freq: Every day | ORAL | 1 refills | Status: AC
Start: 2022-03-24 — End: ?

## 2022-03-24 MED ORDER — MOXIFLOXACIN 400 MG TABLET
400 mg | Freq: Once | ORAL | Status: CP
Start: 2022-03-24 — End: ?
  Administered 2022-03-25: 03:00:00 400 mg via ORAL

## 2022-03-25 DIAGNOSIS — N433 Hydrocele, unspecified: Secondary | ICD-10-CM

## 2022-03-25 DIAGNOSIS — Z79899 Other long term (current) drug therapy: Secondary | ICD-10-CM

## 2022-03-25 DIAGNOSIS — Z87440 Personal history of urinary (tract) infections: Secondary | ICD-10-CM

## 2022-03-25 DIAGNOSIS — I10 Essential (primary) hypertension: Secondary | ICD-10-CM

## 2022-03-25 DIAGNOSIS — Z792 Long term (current) use of antibiotics: Secondary | ICD-10-CM

## 2022-03-25 LAB — CBC WITH AUTO DIFFERENTIAL
BKR WAM ABSOLUTE IMMATURE GRANULOCYTES.: 0.04 x 1000/ÂµL (ref 0.00–0.30)
BKR WAM ABSOLUTE LYMPHOCYTE COUNT.: 0.95 x 1000/ÂµL (ref 0.60–3.70)
BKR WAM ABSOLUTE NRBC (2 DEC): 0 x 1000/ÂµL (ref 0.00–1.00)
BKR WAM ANALYZER ANC: 10.95 x 1000/ÂµL — ABNORMAL HIGH (ref 2.00–7.60)
BKR WAM BASOPHIL ABSOLUTE COUNT.: 0.04 x 1000/ÂµL (ref 0.00–1.00)
BKR WAM BASOPHILS: 0.3 % (ref 0.0–1.4)
BKR WAM EOSINOPHIL ABSOLUTE COUNT.: 0.11 x 1000/ÂµL (ref 0.00–1.00)
BKR WAM EOSINOPHILS: 0.9 % (ref 0.0–5.0)
BKR WAM HEMATOCRIT (2 DEC): 40.1 % (ref 38.50–50.00)
BKR WAM HEMOGLOBIN: 13.9 g/dL (ref 13.2–17.1)
BKR WAM IMMATURE GRANULOCYTES: 0.3 % (ref 0.0–1.0)
BKR WAM LYMPHOCYTES: 7.4 % — ABNORMAL LOW (ref 17.0–50.0)
BKR WAM MCH (PG): 30.4 pg (ref 27.0–33.0)
BKR WAM MCHC: 34.7 g/dL (ref 31.0–36.0)
BKR WAM MCV: 87.7 fL (ref 80.0–100.0)
BKR WAM MONOCYTE ABSOLUTE COUNT.: 0.73 x 1000/ÂµL (ref 0.00–1.00)
BKR WAM MONOCYTES: 5.7 % (ref 4.0–12.0)
BKR WAM MPV: 11.1 fL (ref 8.0–12.0)
BKR WAM NEUTROPHILS: 85.4 % — ABNORMAL HIGH (ref 39.0–72.0)
BKR WAM NUCLEATED RED BLOOD CELLS: 0 % (ref 0.0–1.0)
BKR WAM PLATELETS: 181 x1000/ÂµL (ref 150–420)
BKR WAM RDW-CV: 13.2 % (ref 11.0–15.0)
BKR WAM RED BLOOD CELL COUNT.: 4.57 M/ÂµL (ref 4.00–6.00)
BKR WAM WHITE BLOOD CELL COUNT: 12.8 x1000/ÂµL — ABNORMAL HIGH (ref 4.0–11.0)

## 2022-03-25 LAB — URINALYSIS WITH CULTURE REFLEX      (BH LMW YH)
BKR BILIRUBIN, UA: NEGATIVE
BKR GLUCOSE, UA: NEGATIVE
BKR KETONES, UA: NEGATIVE
BKR NITRITE, UA: NEGATIVE
BKR PH, UA: 6.5 (ref 5.5–7.5)
BKR SPECIFIC GRAVITY, UA: 1.011 (ref 1.005–1.030)
BKR UROBILINOGEN, UA (MG/DL): <2 mg/dL (ref <=2.0)

## 2022-03-25 LAB — BASIC METABOLIC PANEL
BKR ANION GAP: 17 (ref 7–17)
BKR BLOOD UREA NITROGEN: 38 mg/dL — ABNORMAL HIGH (ref 8–23)
BKR BUN / CREAT RATIO: 14.1 (ref 8.0–23.0)
BKR CALCIUM: 8.3 mg/dL — ABNORMAL LOW (ref 8.8–10.2)
BKR CHLORIDE: 99 mmol/L (ref 98–107)
BKR CO2: 21 mmol/L (ref 20–30)
BKR CREATININE: 2.7 mg/dL — ABNORMAL HIGH (ref 0.40–1.30)
BKR EGFR, CREATININE (CKD-EPI 2021): 25 mL/min/{1.73_m2} — ABNORMAL LOW (ref >=60)
BKR GLUCOSE: 99 mg/dL (ref 70–100)
BKR POTASSIUM: 3.6 mmol/L (ref 3.3–5.3)
BKR SODIUM: 137 mmol/L (ref 136–144)

## 2022-03-25 LAB — UA REFLEX CULTURE

## 2022-03-25 LAB — URINE MICROSCOPIC     (BH GH LMW YH)
BKR RBC/HPF: >30 /HPF — AB (ref 0–2)
BKR WBC/HPF: >30 /HPF — AB (ref 0–5)

## 2022-03-25 NOTE — Discharge Instructions
Follow-up with your primary care doctor and neurologist as soon as possible.Follow-up with your cardiologist in regards to hypertension.Return to the emergency department experience any new or worsening symptoms such as but not limited to fever, chest pain, shortness of breath, difficulty urinating, worsening testicular pain.Take antibiotics as prescribed for the full course.We recommend you take 600mg  ibuprofen (Advil, Motrin) every 6 hours or tylenol 650mg  every 6 hours as needed for pain. If needed, you can alternate these medications so that you take one medication every 3 hours. For instance, at noon take ibuprofen, then at 3pm take tylenol, then at 6pm take ibuprofen.

## 2022-03-25 NOTE — ED Notes
8:52 PM Pt presents to the ED for evaluation of left sided testicle swelling that started about 3-4 hours PTA. Pt also relates right rib pain that started around the same time. Relates urinary frequency but states he had prostate surgery about a year ago and states this is normal for him. Hypertensive on arrival. 9:19 PM Pt to Ultrasound.

## 2022-03-25 NOTE — ED Provider Notes
Chief Complaint Patient presents with ? Testicle Swelling   Pt states that he developed pain and swelling to his left testicle 3-4 hours ago. Pt denies pain and burning with urination. Pt also complaining of right rib pain x's 3-4 hours. Pt denies any difficulty urinating Past Medical History: Diagnosis Date ? High blood pressure  ? Hypertension  Marc Evans is a 71 y.o. male with PMHx of HTN, BPH, s/p holep presents emergency department today complaining of left testicular pain.  Patient states the pain started about 3-4 hours ago.  States that he is never experienced similar pain in the past.  States that he has chronic history of UTIs and Foley's, so he usually checks his urine.  Has not noticed hematuria or changes in frequency or dysuria.  Patient states that he was in the shower, it now so noticed right-sided rib pain.  States that the pain does not change with inspiration.  Denies chest pain, shortness of breath, fevers, headache, vision changes, bowel changes, denies being sexually active.Ddx:  Orchitis, UTI, possible less likely testicular torsion. Plan: - CBC, BMP, UTI, Testicular US.  Results: - US Scrotum and Contents with Limited Doppler Final Result    Bilateral left greater than right epididymoorchitis with associated complex left-sided hydrocele and a simple right-sided hydrocele.  [facility] Notify System Classification: Routine  Report Initiated By:  Marc Pollen, MD  Reported And Signed By: Marc Reno, MD    [facility] Radiology and Biomedical Imaging   Summary: Patient presents emergency department today complaining of testicular pain.On exam patient has tenderness to palpation to bilateral testicles worse on left with mild swelling. Patient states the pain significantly improved after morphine here in the emergency department.Ultrasound shows epididymoorchitis.  Patient given 1 round moxifloxacin here and discharged with levofloxacin home.  Patient also hypertensive given labetalol here.  States that he took medication prior to coming into the ED.Instructions follow-up with primary care doctor soon as possible.Case discussed with Dr. Criselda Evans. Marc Griffins, PA-CThis note may have been generated using M*Modal voice dictation software please excuse any typographical errors due to flaws in voice recognition.MDM  Physical ExamED Triage VitalsBP: (!) 241/96 [03/24/22 2044]Pulse: 74 [03/24/22 2044]Pulse from  O2 sat: 74 [03/24/22 2052]Resp: 18 [03/24/22 2044]Temp: 97.7 ?F (36.5 ?C) [03/24/22 2044]Temp src: Temporal [03/24/22 2044]SpO2: 97 % [03/24/22 2044] BP (!) 176/81  - Pulse 68  - Temp 97.7 ?F (36.5 ?C) (Temporal)  - Resp 17  - Ht 5' 11 (1.803 m)  - Wt 104.3 kg  - SpO2 95%  - BMI 32.08 kg/m? Physical ExamVitals and nursing note reviewed. Exam conducted with a chaperone present Marc Hind, RN). Constitutional:     Appearance: Normal appearance. HENT:    Head: Atraumatic.    Mouth/Throat:    Mouth: Mucous membranes are moist. Eyes:    Extraocular Movements: Extraocular movements intact. Cardiovascular:    Rate and Rhythm: Normal rate.    Pulses: Normal pulses.    Heart sounds: Normal heart sounds. Pulmonary:    Effort: Pulmonary effort is normal. Abdominal:    Palpations: Abdomen is soft.    Tenderness: There is no abdominal tenderness. Genitourinary:   Penis: Normal and uncircumcised.     Testes:       Left: Tenderness and swelling (mild) present. Musculoskeletal:    Cervical back: Neck supple. Skin:   General: Skin is warm and dry.    Capillary Refill: Capillary refill takes less than 2 seconds. Neurological:    Mental Status: He is alert and  oriented to person, place, and time. Psychiatric:       Mood and Affect: Mood normal.       Behavior: Behavior normal.  ProceduresAttestation/Critical CareClinical Impressions as of 03/24/22 2324 Epididymoorchitis Hypertension, unspecified type  ED DispositionDischarge Marc Evans, PA03/31/23 2324

## 2022-03-27 LAB — URINE CULTURE

## 2022-03-27 NOTE — Care Coordination-Inpatient
Received +UC on this pt, chart reviewed, pt was prescribed susceptible abx.

## 2022-03-28 ENCOUNTER — Encounter: Admit: 2022-03-28 | Payer: PRIVATE HEALTH INSURANCE | Attending: Medical | Primary: Internal Medicine

## 2022-03-28 ENCOUNTER — Ambulatory Visit: Admit: 2022-03-28 | Payer: MEDICARE | Attending: Medical | Primary: Internal Medicine

## 2022-03-28 ENCOUNTER — Other Ambulatory Visit: Payer: Self-pay | Admitting: Urology

## 2022-03-28 DIAGNOSIS — I10 Essential (primary) hypertension: Secondary | ICD-10-CM

## 2022-03-28 DIAGNOSIS — R972 Elevated prostate specific antigen [PSA]: Secondary | ICD-10-CM

## 2022-03-28 NOTE — Progress Notes
Marc Evans is a 71 y.o. male with history of urinary retention with bilateral hydronephrosis and renal failure requiring bilateral nephrostomy tubes who ultimately underwent HOLEP surgery with Dr. Oliva Bustard on 05/18/21. His post-op renal function leveled out at a serum creatinine of 2.86 with an eGFR of 22.  His voiding improved after surgery with self-reported strong stream without urinary incontinence, hematuria or dysuria. Pathology of prostatic chips demonstrated BPH, 160 grams in aggregate.  A follow-up renal ultrasound on 11/13/2021 showed no evidence of nephrolithiasis or hydronephrosis.  There was diffuse thickening of the bladder wall.  The PVR was low.He did well until 03/24/2022 when he developed acute pain and swelling of the left testicle.  He presented to the emergency room where a scrotal ultrasound demonstrated left epididymal orchitis.  WBC 12.8.  H/H 13.9/40.1. Cr 2.70, eGFR 25.He was discharged on levofloxacin.  Urine culture grew Serratia marcescens susceptible to ciprofloxacin. The patient is here today for follow-up in Urology.  He feels things are improving steadily.  He says his pain has subsided from 09/10 to 2/10.  He says the swelling has diminished slightly.  He denies any fever or chills, chest pain, nausea or vomiting, flank pain, abdominal pain, hematuria or dysuria.  PMH significant for HTN.  Denies DM. He is a non-smoker and does not use alcohol or drugs.Past Medical History: Diagnosis Date ? High blood pressure  ? Hypertension  Past Surgical History: Procedure Laterality Date ? TONSILLECTOMY    as a child ? 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 No Known AllergiesReview Of SystemsReview of Systems As per HPI.Physical ExamTemp 97.5 ?F (36.4 ?C) (No-touch scanner)  - Ht 5' 11 (1.803 m)  - Wt 104.3 kg  - BMI 32.07 kg/m?  Post Void Bladder Scan Measurement Date Value Ref Range Status 09/27/2021 18 mL Final   Comment:   pt urinated 5 min ago Physical Exam Vitals:  03/28/22 1454 Temp: 97.5 ?F (36.4 ?C) Appearance: Normal appearing male HENT: normocephalic and atraumatic.Neck. Neck is supple Chest: Normal respiratory effort without distress. Flank: No CVA tenderness.Abdomen: Obese, soft and non-tender.Genital:  No inguinal adenopathy.  No genital rashes or sores.  Phallus with redundant foreskin.  Glans without lesions.  No urethral discharge.  Normal appearing scrotum without redness.  Normal right testis.  Left testis and left epididymis are moderately enlarged and mildly tender.  There is no fluctuance or subcutaneous emphysema.  Perineum is unremarkable.Musculoskeletal: Normal range of motion. Skin: Skin is warm without obvious lesions. Extremities: WWP, no pedal edema.Mental status: Awake and alert.US SCROTUM AND TESTICLES WITH LIMITED DOPPLER 03/24/2022?HISTORY: testicular pain. ?COMPARISON: None.?TECHNIQUE: Grayscale, color and pulsed Doppler imaging were performed.?FINDINGS: ?RIGHT TESTIS: Size (in cm): 4.4 cm x 3.0 cm x 2.6 cm.Echogenicity: Normal. Vascularity: Increased.Focal lesions: None. Resistive index: 0.8?LEFT TESTIS: Size (in cm): 4.2 cm x 2.9 cm x 3.4 cm.Echogenicity: Normal. Vascularity: Increased.Focal lesions: None. Resistive index: 0.7?RIGHT EPIDIDYMIS: Size of head (in cm): 0.9 cm x 1.0 cm x 1.4 cm.Echogenicity: Normal.Vascularity: Increased Cysts: None. ?LEFT EPIDIDYMIS: Size of head (in cm): 0.8 cm x 1.8 cm x 1.4 cm.Echogenicity: Normal.Vascularity: Increased Cysts: None. ?OTHER FINDINGS: Varicocele: Not present. Hydrocele: There is a small right-sided hydrocele. There is a complex left-sided hydrocele seen. ?IMPRESSION: ?Bilateral left greater than right epididymoorchitis with associated complex left-sided hydrocele and a simple right-sided hydrocele.?[facility] Notify System Classification: Routine?Report Initiated By:  Orlie Pollen, MD?Reported And Signed By: Madie Reno, MD  [facility] Radiology and Biomedical Imaging?Assessment & Plan:Marc Evans is a 71 y.o.non-diabetic male with HTN s/p  HOLEP for management of urinary retention with renal failure.  He resumed effective volitional voiding and did well until 03/24/2022 when he developed acute pain and swelling of the left testicle.  He presented to the emergency room where a scrotal ultrasound demonstrated left epididymal orchitis.  He was discharged on levofloxacin 500 mg daily.  On follow-up today he is feeling better.  Pain and swelling are subsiding.  Exam is consistent with resolving left epididymal orchitis with no evidence of Fournier gangrene, cellulitis or abscess formation.  I have instructed the patient to take only 1/2 a tablet of the levofloxacillin daily given his CKD.  Total duration will be 10 days.  He is encouraged to avoid any heavy lifting or straining and to keep the scrotum elevated.  He is unable to do Sitz baths at home but is encouraged to take daily warm showers.  I am going to have him follow up by telemedicine in 1 week to ensure that he continues to improve.  Strict call back and ER parameters were reviewed with the patient.  He was encouraged to contact us in the interim should he have any questions, concerns or new symptoms.Encounter Diagnoses Name SNOMED [place](R) Primary? ? Disease of urinary tract DISORDER OF URINARY TRACT Yes Encounter time: 30 minutes were spent reviewing the chart, taking a history, examining the patient, counseling the patient and making medical decisions, and coordinating follow up care.Caro Hight, PA

## 2022-03-29 DIAGNOSIS — N399 Disorder of urinary system, unspecified: Secondary | ICD-10-CM

## 2022-03-31 NOTE — Progress Notes (Signed)
? ? ?Electrophysiology Office Note ?Date: 03/31/2022 ? ?ID:  Russell Love., DOB 1951-03-24, MRN [id number] ? ?PCP: Olin Hauser, DO ?Primary Cardiologist: Thompson Grayer, MD ?Electrophysiologist: Thompson Grayer, MD  ? ?CC: Routine ICD follow-up ? ?Russell Lingenfelter. is a 71 y.o. male seen today for Thompson Grayer, MD for routine electrophysiology followup.  Since last being seen in our clinic the patient reports doing very well.  he denies chest pain, palpitations, dyspnea, PND, orthopnea, nausea, vomiting, dizziness, syncope, edema, weight gain, or early satiety. He has not had ICD shocks.  ? ?Device History: ?Medtronic Single Chamber ICD implanted 04/2019 for CHF and VT ?History of AAD therapy: Yes; currently on amiodarone   ? ?Past Medical History:  ?Diagnosis Date  ? CAD (coronary artery disease)   ? 05/07/2019 LHC showed nonobstructive CAD  ? Diabetes ([place])   ? Frequent PVCs   ? Heart failure with reduced ejection fraction ([place])   ? 04/2019 EF 20-25%  ? Hypertension   ? Impotence of organic origin   ? NICM (nonischemic cardiomyopathy) ([place])   ? HFrEF in setting of NICM  ? Pain in joint, shoulder region   ? Personal history of colonic polyps   ? Ventricular tachycardia   ? 04/2019 in setting of cardiac arrest  ? ?Past Surgical History:  ?Procedure Laterality Date  ? CARDIAC CATHETERIZATION  11/17/2013  ? COLONOSCOPY  2009  ? ICD IMPLANT N/A 05/08/2019  ? Procedure: ICD IMPLANT;  Surgeon: Thompson Grayer, MD;  Location: [facility];  Service: Cardiovascular;  Laterality: N/A;  ? RIGHT/LEFT HEART CATH AND CORONARY ANGIOGRAPHY N/A 05/07/2019  ? Procedure: RIGHT/LEFT HEART CATH AND CORONARY ANGIOGRAPHY;  Surgeon: Nelva Bush, MD;  Location: [facility];  Service: Cardiovascular;  Laterality: N/A;  ? ? ?Current Outpatient Medications  ?Medication Sig Dispense Refill  ? amiodarone (PACERONE) 200 MG tablet Take 0.5 tablets (100 mg total) by mouth daily. 45 tablet 0  ? Blood Glucose Monitoring Suppl (ONE  TOUCH ULTRA 2) w/Device KIT Use to check blood sugar x 1 daily 1 kit 0  ? brimonidine (ALPHAGAN) 0.2 % ophthalmic solution Place 1 drop into both eyes 2 (two) times daily.    ? carvedilol (COREG) 25 MG tablet Take 1 tablet (25 mg total) by mouth 2 (two) times daily. 180 tablet 3  ? Cholecalciferol (VITAMIN D3) 2000 units TABS Take 1 capsule by mouth daily.     ? dorzolamide-timolol (COSOPT) 22.3-6.8 MG/ML ophthalmic solution Place 1 drop into both eyes 2 (two) times daily.    ? doxazosin (CARDURA) 1 MG tablet Take 1 tablet (1 mg total) by mouth 2 (two) times daily. 180 tablet 3  ? doxazosin (CARDURA) 1 MG tablet Take 1 tablet (1 mg total) by mouth 2 (two) times daily. 28 tablet 0  ? isosorbide mononitrate (IMDUR) 60 MG 24 hr tablet Take 1.5 tablets (90 mg total) by mouth 2 (two) times daily. 270 tablet 3  ? isosorbide mononitrate (IMDUR) 60 MG 24 hr tablet Take 1.5 tablets (90 mg total) by mouth 2 (two) times daily. 42 tablet 0  ? latanoprost (XALATAN) 0.005 % ophthalmic solution Place 1 drop into both eyes at bedtime.     ? metFORMIN (GLUCOPHAGE-XR) 500 MG 24 hr tablet TAKE 1 TABLET BY MOUTH EVERY DAY WITH BREAKFAST 90 tablet 1  ? nitroGLYCERIN (NITROSTAT) 0.4 MG SL tablet DISSOLVE ONE TABLET UNDER THE TONGUE EVERY 5 MINUTES AS NEEDED FOR CHEST PAIN.  DO NOT EXCEED A TOTAL OF  3 DOSES IN 15 MINUTES 25 tablet 0  ? OneTouch Delica Lancets 29J MISC Use to check blood sugar up to 1 time daily 100 each 12  ? ONETOUCH ULTRA test strip Check blood sugar 1 x daily 100 each 12  ? polyvinyl alcohol (LIQUIFILM TEARS) 1.4 % ophthalmic solution Place 2 drops into both eyes every 8 (eight) hours. 15 mL 0  ? pravastatin (PRAVACHOL) 10 MG tablet Take 1 tablet (10 mg total) by mouth at bedtime. 90 tablet 3  ? sacubitril-valsartan (ENTRESTO) 97-103 MG Take 1 tablet by mouth 2 (two) times daily. 28 tablet 1  ? spironolactone (ALDACTONE) 25 MG tablet Take 1 tablet (25 mg total) by mouth daily. 90 tablet 3  ? tamsulosin (FLOMAX) 0.4 MG  CAPS capsule Take 0.4 mg by mouth daily.    ? timolol (TIMOPTIC) 0.5 % ophthalmic solution Place 1 drop into both eyes 2 (two) times daily.     ? ?No current facility-administered medications for this visit.  ? ? ?Allergies:   Patient has no known allergies.  ? ?Social History: ?Social History  ? ?Socioeconomic History  ? Marital status: Married  ?  Spouse name: Not on file  ? Number of children: Not on file  ? Years of education: Not on file  ? Highest education level: Not on file  ?Occupational History  ? Occupation: [employer]  ? Occupation: retired  ?Tobacco Use  ? Smoking status: Never  ? Smokeless tobacco: Never  ?Vaping Use  ? Vaping Use: Never used  ?Substance and Sexual Activity  ? Alcohol use: Yes  ?  Comment: occassional  ? Drug use: No  ? Sexual activity: Not on file  ?Other Topics Concern  ? Not on file  ?Social History Narrative  ? Not on file  ? ?Social Determinants of Health  ? ?Financial Resource Strain: Low Risk   ? Difficulty of Paying Living Expenses: Not hard at all  ?Food Insecurity: No Food Insecurity  ? Worried About Charity fundraiser in the Last Year: Never true  ? Ran Out of Food in the Last Year: Never true  ?Transportation Needs: No Transportation Needs  ? Lack of Transportation (Medical): No  ? Lack of Transportation (Non-Medical): No  ?Physical Activity: Insufficiently Active  ? Days of Exercise per Week: 4 days  ? Minutes of Exercise per Session: 30 min  ?Stress: No Stress Concern Present  ? Feeling of Stress : Not at all  ?Social Connections: Not on file  ?Intimate Partner Violence: Not on file  ? ? ?Family History: ?Family History  ?Problem Relation Age of Onset  ? Colon cancer Father   ?     55's  ? Hypertension Mother   ? Diabetes Mother   ? ? ?Review of Systems: ?All other systems reviewed and are otherwise negative except as noted above. ? ? ?Physical Exam: ?There were no vitals filed for this visit.  ? ?GEN- The patient is well appearing, alert and oriented x 3 today.    ?HEENT: normocephalic, atraumatic; sclera clear, conjunctiva pink; hearing intact; oropharynx clear; neck supple, no JVP ?Lymph- no cervical lymphadenopathy ?Lungs- Clear to ausculation bilaterally, normal work of breathing.  No wheezes, rales, rhonchi ?Heart- Regular rate and rhythm, no murmurs, rubs or gallops, PMI not laterally displaced ?GI- soft, non-tender, non-distended, bowel sounds present, no hepatosplenomegaly ?Extremities- no clubbing or cyanosis. No edema; DP/PT/radial pulses 2+ bilaterally ?MS- no significant deformity or atrophy ?Skin- warm and dry, no rash or lesion;  ICD pocket well healed ?Psych- euthymic mood, full affect ?Neuro- strength and sensation are intact ? ?ICD interrogation- reviewed in detail today,  See PACEART report ? ?EKG:  EKG is not ordered today. ?Personal review of EKG ordered  01/30/2022  shows NSR at 62 ? ?Recent Labs: ?08/18/2021: Hemoglobin 13.1; Platelets 178 ?11/15/2021: ALT 13; BUN 18; Creat 1.20; Potassium 3.9; Sodium 141  ? ?Wt Readings from Last 3 Encounters:  ?01/30/22 161 lb 6 oz (73.2 kg)  ?11/28/21 158 lb (71.7 kg)  ?08/25/21 159 lb (72.1 kg)  ?  ? ?Other studies Reviewed: ?Additional studies/ records that were reviewed today include: Previous EP office notes.  ? ?Assessment and Plan: ? ?1.  Chronic systolic dysfunction s/p Medtronic single chamber ICD  ?euvolemic today ?Stable on an appropriate medical regimen ?Normal ICD function ?See Claudia [facility] report ?No changes today ? ?2. H/o VT ?Continue amiodarone 100 mg daily ?Surveillance labs today.  ? ?3. HTN ?Stable on current regimen  ? ?Current medicines are reviewed at length with the patient today.   ? ?Labs/ tests ordered today include:  ?Orders Placed This Encounter  ?Procedures  ? Comprehensive metabolic panel  ? TSH  ? T4, free  ? ?Disposition:   Follow up with Dr. Quentin Ore in 6 months to establish in [place]. ? ?Signed, ?Shirley Friar, PA-C  ?03/31/2022 ?1:53 PM ? ?[facility] ?[address] ?[address] ?[place] [address] ?[phone] (office) ?[phone] (fax)  ?

## 2022-04-04 ENCOUNTER — Ambulatory Visit: Admit: 2022-04-04 | Payer: MEDICARE | Attending: Medical | Primary: Internal Medicine

## 2022-04-04 ENCOUNTER — Encounter: Admit: 2022-04-04 | Payer: PRIVATE HEALTH INSURANCE | Attending: Medical | Primary: Internal Medicine

## 2022-04-04 DIAGNOSIS — N451 Epididymitis: Secondary | ICD-10-CM

## 2022-04-04 DIAGNOSIS — I10 Essential (primary) hypertension: Secondary | ICD-10-CM

## 2022-04-05 ENCOUNTER — Other Ambulatory Visit: Payer: Self-pay | Admitting: Internal Medicine

## 2022-04-05 ENCOUNTER — Encounter: Payer: Self-pay | Admitting: Student

## 2022-04-05 ENCOUNTER — Ambulatory Visit: Payer: Medicare HMO | Admitting: Student

## 2022-04-05 VITALS — BP 134/80 | HR 64 | Ht 68.0 in | Wt 162.4 lb

## 2022-04-05 DIAGNOSIS — I10 Essential (primary) hypertension: Secondary | ICD-10-CM

## 2022-04-05 DIAGNOSIS — I472 Ventricular tachycardia, unspecified: Secondary | ICD-10-CM

## 2022-04-05 DIAGNOSIS — I5022 Chronic systolic (congestive) heart failure: Secondary | ICD-10-CM | POA: Diagnosis not present

## 2022-04-05 LAB — CUP PACEART INCLINIC DEVICE CHECK
Battery Remaining Longevity: 114 mo
Battery Voltage: 3.01 V
Brady Statistic RV Percent Paced: 0.11 %
Date Time Interrogation Session: 20230412123537
HighPow Impedance: 56 Ohm
Implantable Lead Implant Date: 20200514
Implantable Lead Location: 753860
Implantable Lead Model: 6935
Implantable Pulse Generator Implant Date: 20200514
Lead Channel Impedance Value: 342 Ohm
Lead Channel Impedance Value: 437 Ohm
Lead Channel Pacing Threshold Amplitude: 0.625 V
Lead Channel Pacing Threshold Pulse Width: 0.4 ms
Lead Channel Sensing Intrinsic Amplitude: 17.625 mV
Lead Channel Sensing Intrinsic Amplitude: 9.125 mV
Lead Channel Setting Pacing Amplitude: 2 V
Lead Channel Setting Pacing Pulse Width: 0.4 ms
Lead Channel Setting Sensing Sensitivity: 0.3 mV

## 2022-04-05 LAB — COMPREHENSIVE METABOLIC PANEL
ALT: 14 IU/L (ref 0–44)
AST: 14 IU/L (ref 0–40)
Albumin/Globulin Ratio: 2 (ref 1.2–2.2)
Albumin: 4.1 g/dL (ref 3.8–4.8)
Alkaline Phosphatase: 53 IU/L (ref 44–121)
BUN/Creatinine Ratio: 11 (ref 10–24)
BUN: 13 mg/dL (ref 8–27)
Bilirubin Total: 0.5 mg/dL (ref 0.0–1.2)
CO2: 24 mmol/L (ref 20–29)
Calcium: 8.7 mg/dL (ref 8.6–10.2)
Chloride: 104 mmol/L (ref 96–106)
Creatinine, Ser: 1.23 mg/dL (ref 0.76–1.27)
Globulin, Total: 2.1 g/dL (ref 1.5–4.5)
Glucose: 230 mg/dL — ABNORMAL HIGH (ref 70–99)
Potassium: 4.6 mmol/L (ref 3.5–5.2)
Sodium: 139 mmol/L (ref 134–144)
Total Protein: 6.2 g/dL (ref 6.0–8.5)
eGFR: 64 mL/min/{1.73_m2} (ref >59)

## 2022-04-05 LAB — TSH: TSH: 0.578 u[IU]/mL (ref 0.450–4.500)

## 2022-04-05 LAB — T4, FREE: Free T4: 1.9 ng/dL — ABNORMAL HIGH (ref 0.82–1.77)

## 2022-04-05 NOTE — Patient Instructions (Signed)
Medication Instructions:  ?Your physician recommends that you continue on your current medications as directed. Please refer to the Current Medication list given to you today. ? ?*If you need a refill on your cardiac medications before your next appointment, please call your pharmacy* ? ? ?Lab Work: ?TODAY: CMET, TSH, FreeT4 ? ?If you have labs (blood work) drawn today and your tests are completely normal, you will receive your results only by: ?MyChart Message (if you have MyChart) OR ?A paper copy in the mail ?If you have any lab test that is abnormal or we need to change your treatment, we will call you to review the results. ? ? ?Follow-Up: ?At [facility], you and your health needs are our priority.  As part of our continuing mission to provide you with exceptional heart care, we have created designated Provider Care Teams.  These Care Teams include your primary Cardiologist (physician) and Advanced Practice Providers (APPs -  Physician Assistants and Nurse Practitioners) who all work together to provide you with the care you need, when you need it. ? ? ?Your next appointment:   ?6 month(s) ? ?The format for your next appointment:   ?In Person ? ?Provider:   ?Steffanie Dunn, MD  ? ? ?Important Information About Sugar ? ? ? ? ?  ?

## 2022-04-06 ENCOUNTER — Ambulatory Visit: Payer: Medicare HMO

## 2022-04-06 NOTE — Progress Notes
Marc Evans is a 71 y.o. male with history of urinary retention with bilateral hydronephrosis and renal failure requiring bilateral nephrostomy tubes who ultimately underwent HOLEP surgery with Dr. Oliva Bustard on 05/18/21. His post-op renal function leveled out at a serum creatinine of 2.86 with an eGFR of 22.  His voiding improved after surgery with self-reported strong stream without urinary incontinence, hematuria or dysuria. Pathology of prostatic chips demonstrated BPH, 160 grams in aggregate.  A follow-up renal ultrasound on 11/13/2021 showed no evidence of nephrolithiasis or hydronephrosis.  There was diffuse thickening of the bladder wall.  The PVR was low.He did well until 03/24/2022 when he developed acute pain and swelling of the left testicle.  He presented to the emergency room where a scrotal ultrasound demonstrated left epididymal orchitis.  He was treated with levofloxacin.He was seen in the urology clinic on 03/28/2022.  Things were improving at that point.  Exam showed a swollen left testicle without evidence of Fournier's gangrene or abscess formation.The patient is here today for follow-up in Urology.  He notes progressive improvement.  There is still some swelling.  Pain is minimal. He denies any fever or chills, chest pain, nausea or vomiting, flank pain, abdominal pain, hematuria or dysuria.  He is eating and drinking well.PMH significant for HTN.  Denies DM. He is a non-smoker and does not use alcohol or drugs.Past Medical History: Diagnosis Date ? High blood pressure  ? Hypertension  Past Surgical History: Procedure Laterality Date ? TONSILLECTOMY    as a child ? 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 No Known AllergiesReview Of SystemsReview of Systems As per HPI.Physical ExamThere were no vitals taken for this visit. Post Void Bladder Scan Measurement Date Value Ref Range Status 09/27/2021 18 mL Final   Comment:   pt urinated 5 min ago Physical examination - telemedicine visitPleasant, alert male in no acute distress.  Appears in no acute distress.  Appears comfortable, engaged and cogent.  Speaking in full sentences without any shortness of breath.Physical Exam - 04/04/2023There were no vitals filed for this visit.Appearance: Normal appearing male HENT: normocephalic and atraumatic.Neck. Neck is supple Chest: Normal respiratory effort without distress. Flank: No CVA tenderness.Abdomen: Obese, soft and non-tender.Genital:  No inguinal adenopathy.  No genital rashes or sores.  Phallus with redundant foreskin.  Glans without lesions.  No urethral discharge.  Normal appearing scrotum without redness.  Normal right testis.  Left testis and left epididymis are moderately enlarged and mildly tender.  There is no fluctuance or subcutaneous emphysema.  Perineum is unremarkable.Musculoskeletal: Normal range of motion. Skin: Skin is warm without obvious lesions. Extremities: WWP, no pedal edema.Mental status: Awake and alert.US SCROTUM AND TESTICLES WITH LIMITED DOPPLER 03/24/2022?HISTORY: testicular pain. ?COMPARISON: None.?TECHNIQUE: Grayscale, color and pulsed Doppler imaging were performed.?FINDINGS: ?RIGHT TESTIS: Size (in cm): 4.4 cm x 3.0 cm x 2.6 cm.Echogenicity: Normal. Vascularity: Increased.Focal lesions: None. Resistive index: 0.8?LEFT TESTIS: Size (in cm): 4.2 cm x 2.9 cm x 3.4 cm.Echogenicity: Normal. Vascularity: Increased.Focal lesions: None. Resistive index: 0.7?RIGHT EPIDIDYMIS: Size of head (in cm): 0.9 cm x 1.0 cm x 1.4 cm.Echogenicity: Normal.Vascularity: Increased Cysts: None. ?LEFT EPIDIDYMIS: Size of head (in cm): 0.8 cm x 1.8 cm x 1.4 cm.Echogenicity: Normal.Vascularity: Increased Cysts: None. ?OTHER FINDINGS: Varicocele: Not present. Hydrocele: There is a small right-sided hydrocele. There is a complex left-sided hydrocele seen. ?IMPRESSION: ?Bilateral left greater than right epididymoorchitis with associated complex left-sided hydrocele and a simple right-sided hydrocele.?[facility] Notify System Classification: Routine?Report Initiated By:  Orlie Pollen, MD?Reported And Signed By: Leeroy Bock  Smith Robert, MD  [facility] Radiology and Biomedical Imaging?Assessment & Plan:Marc Evans is a 71 y.o.non-diabetic male with HTN s/p HOLEP for management of urinary retention with renal failure.  He resumed effective volitional voiding after surgery.He developed acute left epididymal orchitis on 03/24/2022.  He was managed with levofloxacin.  He was seen in the urology clinic on 03/28/2022 and appeared to be making good progress.  There was no evidence of Fournier gangrene or abscess formation.On telemedicine follow-up today he reports continued improvement.  He still has some swelling.  He will finish up his levofloxacin and follow-up in the clinic in about 4 weeks time to ensure resolution of the epididymal orchitis.Strict call back and ER parameters were reviewed with the patient.  He was encouraged to contact us in the interim should he have any questions, concerns or new symptoms.Encounter Diagnoses Name SNOMED [place](R) Primary? ? Left epididymitis EPIDIDYMITIS Yes  VIDEO TELEHEALTH VISIT: This clinician is part of the telehealth program and is conducting this visit in a currently approved location. For this visit the clinician and patient were present via interactive audio & video telecommunications system that permits real-time communications. Patient/parent or guardian consent given for video visit: Yes   State patient is located in: [place] The clinician is appropriately licensed in the above state to provide care for this visit.   Other individuals present during the telehealth encounter and their role/relation: none   If billing based on time, please complete (Not required if billing based on MDM):                            Total time spent in medical video consultation: ; Total time spent by the provider on the day of service, which includes time spent on chart review, medical video consultation, education, coordination of care/services and counseling - 20 minutes.   Because this visit was completed over video, a hands-on physical exam was not performed.  Patient/parent or guardian understands and knows to call back if condition changes. Caro Hight, PA

## 2022-04-10 ENCOUNTER — Encounter: Admit: 2022-04-10 | Payer: PRIVATE HEALTH INSURANCE | Primary: Internal Medicine

## 2022-04-10 ENCOUNTER — Inpatient Hospital Stay: Admit: 2022-04-10 | Discharge: 2022-04-10 | Payer: MEDICARE | Attending: Emergency Medicine | Primary: Internal Medicine

## 2022-04-10 ENCOUNTER — Inpatient Hospital Stay: Admit: 2022-04-10 | Discharge: 2022-04-10 | Payer: MEDICARE | Attending: Internal Medicine | Primary: Internal Medicine

## 2022-04-10 ENCOUNTER — Ambulatory Visit: Admit: 2022-04-10 | Payer: MEDICARE | Primary: Internal Medicine

## 2022-04-10 DIAGNOSIS — Z20822 Contact with and (suspected) exposure to covid-19: Secondary | ICD-10-CM

## 2022-04-10 DIAGNOSIS — Z79899 Other long term (current) drug therapy: Secondary | ICD-10-CM

## 2022-04-10 DIAGNOSIS — R21 Rash and other nonspecific skin eruption: Secondary | ICD-10-CM

## 2022-04-10 DIAGNOSIS — R2243 Localized swelling, mass and lump, lower limb, bilateral: Secondary | ICD-10-CM

## 2022-04-10 DIAGNOSIS — I10 Essential (primary) hypertension: Secondary | ICD-10-CM

## 2022-04-10 DIAGNOSIS — T448X5A Adverse effect of centrally-acting and adrenergic-neuron-blocking agents, initial encounter: Secondary | ICD-10-CM

## 2022-04-10 DIAGNOSIS — I160 Hypertensive urgency: Secondary | ICD-10-CM

## 2022-04-10 DIAGNOSIS — I129 Hypertensive chronic kidney disease with stage 1 through stage 4 chronic kidney disease, or unspecified chronic kidney disease: Secondary | ICD-10-CM

## 2022-04-10 DIAGNOSIS — N189 Chronic kidney disease, unspecified: Secondary | ICD-10-CM

## 2022-04-10 LAB — COMPREHENSIVE METABOLIC PANEL
BKR A/G RATIO: 0.8 — ABNORMAL LOW (ref 1.0–2.2)
BKR ALANINE AMINOTRANSFERASE (ALT): 13 U/L — ABNORMAL LOW (ref 14–63)
BKR ALBUMIN: 3.6 g/dL (ref 3.4–5.0)
BKR ALKALINE PHOSPHATASE: 123 U/L — ABNORMAL HIGH (ref 46–116)
BKR ANION GAP: 13 (ref 5–16)
BKR ASPARTATE AMINOTRANSFERASE (AST): 15 U/L (ref 10–42)
BKR AST/ALT RATIO: 1.2
BKR BILIRUBIN TOTAL: 0.3 mg/dL (ref 0.2–1.2)
BKR BLOOD UREA NITROGEN: 43 mg/dL — ABNORMAL HIGH (ref 7–18)
BKR BUN / CREAT RATIO: 13.9 (ref 8.0–23.0)
BKR CALCIUM: 8.2 mg/dL — ABNORMAL LOW (ref 8.5–10.5)
BKR CHLORIDE: 103 mmol/L (ref 98–107)
BKR CO2: 23 mmol/L (ref 21–32)
BKR CREATININE: 3.09 mg/dL — ABNORMAL HIGH (ref 0.80–1.30)
BKR EGFR, CREATININE (CKD-EPI 2021): 21 mL/min/{1.73_m2} — ABNORMAL LOW (ref >=60)
BKR GLOBULIN: 4.3 g/dL — ABNORMAL HIGH (ref 2.3–3.5)
BKR GLUCOSE: 94 mg/dL (ref 70–100)
BKR POTASSIUM: 3.9 mmol/L (ref 3.5–5.1)
BKR PROTEIN TOTAL: 7.9 g/dL (ref 6.0–8.0)
BKR SODIUM: 139 mmol/L (ref 136–145)

## 2022-04-10 LAB — TROPONIN T HIGH SENSITIVITY, 1 HOUR WITH REFLEX (BH GH LMW YH)
BKR TROPONIN T HS 1 HOUR DELTA FROM 0 HOUR: 1 ng/L
BKR TROPONIN T HS 1 HOUR: 23 ng/L — ABNORMAL HIGH

## 2022-04-10 LAB — TROPONIN T HIGH SENSITIVITY, 3 HOUR (BH GH LMW YH)
BKR TROPONIN T HS 1 HOUR DELTA FROM 0 HOUR ON 3HR: 1 ng/L
BKR TROPONIN T HS 3 HOUR DELTA FROM 0 HOUR: 0 ng/L
BKR TROPONIN T HS 3 HOUR: 22 ng/L — ABNORMAL HIGH

## 2022-04-10 LAB — CBC WITH AUTO DIFFERENTIAL
BKR BASOPHILS (MC): 0.5 % (ref 0.0–1.0)
BKR EOSINOPHILS (MC): 1.7 % (ref 0.0–5.0)
BKR HEMATOCRIT (MC): 37.6 % — ABNORMAL LOW (ref 38–46)
BKR HEMOGLOBIN (MC): 12.4 g/dL — ABNORMAL LOW (ref 13.5–16.0)
BKR LYMPHOCYTES (MC): 13.8 % — ABNORMAL LOW (ref 20.0–40.0)
BKR MCH (MC): 29 pg (ref 28–32)
BKR MCHC (MC): 33 g/dL (ref 32–36)
BKR MCV (MC): 88.1 fL (ref 80–94)
BKR MONOCYTES (MC): 6.2 % (ref 0.0–10.0)
BKR MPV (MC): 10.2 fL (ref 8.8–12.0)
BKR NEUTROPHILS (MC): 77.8 % — ABNORMAL HIGH (ref 50.0–70.0)
BKR PLATELETS (MC): 225 10*3/uL (ref 150–400)
BKR RDW-CV (MC): 12.9 % (ref 11.5–14.5)
BKR RED BLOOD CELL COUNT (MC): 4.3 10*6/uL (ref 4.2–5.5)
BKR WHITE BLOOD CELL COUNT (MC): 8.4 10*3/uL (ref 5.0–10.0)

## 2022-04-10 LAB — INFLUENZA A+B/RSV BY RT-PCR
BKR INFLUENZA A: NEGATIVE
BKR INFLUENZA B: NEGATIVE
BKR RESPIRATORY SYNCYTIAL VIRUS: NEGATIVE

## 2022-04-10 LAB — NT-PROBNPE: BKR B-TYPE NATRIURETIC PEPTIDE, PRO (PROBNP): 1475 pg/mL — ABNORMAL HIGH (ref <125.0)

## 2022-04-10 LAB — TROPONIN T HIGH SENSITIVITY, 0 HOUR BASELINE WITH REFLEX (BH GH LMW YH): BKR TROPONIN T HS 0 HOUR BASELINE: 22 ng/L — ABNORMAL HIGH

## 2022-04-10 LAB — SARS COV-2 (COVID-19) RNA: BKR SARS-COV-2 RNA (COVID-19) (YH): NEGATIVE

## 2022-04-10 MED ORDER — NICARDIPINE 40 MG/200 ML(0.2 MG/ML) IN SOD CHLOR(ISO) INTRAVENOUS SOLN
402000.2 mg/200 mL (0.2 mg/mL) | INTRAVENOUS | Status: DC
Start: 2022-04-10 — End: 2022-04-10

## 2022-04-10 MED ORDER — HYDRALAZINE 20 MG/ML INJECTION SOLUTION
20 mg/mL | Freq: Once | INTRAVENOUS | Status: CP
Start: 2022-04-10 — End: ?
  Administered 2022-04-10: 22:00:00 20 mL via INTRAVENOUS

## 2022-04-10 MED ORDER — METHYLPREDNISOLONE SOD SUCC (PF) 125 MG/2 ML SOLUTION FOR INJECTION
125 mg/2 mL | Freq: Once | INTRAVENOUS | Status: CP
Start: 2022-04-10 — End: ?
  Administered 2022-04-10: 20:00:00 125 mL via INTRAVENOUS

## 2022-04-10 MED ORDER — PREDNISONE 20 MG TABLET
20 mg | ORAL_TABLET | Freq: Every day | ORAL | 1 refills | Status: AC
Start: 2022-04-10 — End: ?

## 2022-04-10 MED ORDER — CARVEDILOL IMMEDIATE RELEASE 6.25 MG TABLET
6.25 mg | Freq: Once | ORAL | Status: CP
Start: 2022-04-10 — End: ?
  Administered 2022-04-10: 21:00:00 6.25 mg via ORAL

## 2022-04-10 MED ORDER — DIPHENHYDRAMINE 50 MG/ML INJECTION (WRAPPED E-RX)
50 mg/mL | Freq: Once | INTRAVENOUS | Status: CP
Start: 2022-04-10 — End: ?
  Administered 2022-04-10: 20:00:00 50 mL via INTRAVENOUS

## 2022-04-10 NOTE — ED Notes
1:46 PM Patient AAOx4 presents with generalized weakness and lightheadedness for the past couple of days. Reports recent increase dose of Labetalol, 100mg  to 300mg . Patient reports that he had a reaction to taking the 300mg  of Labetalol and has been taking 100mg  of Labetalol. Patient reports rash a couple days ago and took [facility] daryl and reports rash has subsided. Of note, right lower leg noted to be more swollen than left. Reports sitting for long periods of time. Patient endorses SOB upon exertion. Patient hypertensive upon arrival, SBP 200s, for the past week, reports taking medications this morning. Denies chest pain, HA. EKG completed and shown to Dr. Newt Lukes. Pending to be seen by provider. 2:25PM Dr. Marisa Cyphers at the bedside evaluating patient. 2:50PM Covid collected and sent to lab. 3:02PM Phleb at bedside to collect blood work. 3:40PM RN called to room, patient reports itching to my legs. Mild redness noted to bilateral lower extremities. Denies SOB, respirations even and unlabored, NAD. SpO2 97%RA. Provider made aware. 4:08PM IV established by Herbert Seta RN, medications given per [facility]. 4:40PM PO Carvedilol given per [facility]. 5:30PM Patient remains hypertensive, SBP 200s. Provider made aware. 6:02PM IV Hydralazine given per [facility]. 6:35PM Patient remains hypertensive, SBP 200s. Provider made aware.

## 2022-04-10 NOTE — ED Notes
Dr Hermina Staggers in to see and examine, pt to go to the ER for further evaluation, verbalizes understanding, declined ambulance transfer to the ER

## 2022-04-10 NOTE — ED Notes
1915 PM bedside report received, last bp reported by previous nurse, cardene to be held and pt is to be dced to home, pt denies cp, sob, pt is presently  eatly a sandwich. Provider gave verbale d/c instructions. 1930pm iv dced site is w/o redness or edema, pt given verbal instruction no rash or redness seen, pt dced to home.

## 2022-04-10 NOTE — ED Provider Notes
Chief Complaint Patient presents with ? Medication Reaction   Increase in Labetolol Saturday; bilat leg swelling, rash to arms and legs noted Sunday. Rash subisded after taking 3 tabs benadryl last night. No facial swelling or resp distress.  ? Follow-up Hypertension Medical Decision Marc Evans is a 71 year old male with a history of hypertension who was sent here from the urgent care center for further evaluation of resolving rash and swelling to bilateral upper and lower extremities over the past 2 days.  Patient states that his labetalol dose was increased to 300 milligrams twice a day 2 days ago.  After the 1st day of taking the 300 milligram new dose, he suddenly developed diffuse itching rash to his extremities with swelling, shortness of breath and dizziness.  He took Benadryl last night and reports that the rash and itching has markedly improved and almost completely resolved.  He states that he went online to look at the side effects of medication and that is why he went to the urgent care for evaluation and subsequently was sent here.  He states that today he took 100 milligrams of labetalol back to his original dose, and has had no recurrence of the rash/swelling/itching.  He also states that he called his cardiologist to discuss his symptoms, and is still awaiting response.Amount and/or Complexity of Data ReviewedLabs: ordered.Radiology: ordered.  Physical ExamED Triage Vitals [04/10/22 1251]BP: (S) (!) 209/129Pulse: 60Pulse from  O2 sat: n/aResp: 18Temp: 97.7 ?F (36.5 ?C)Temp src: TemporalSpO2: 100 % BP (!) 186/101  - Pulse 61  - Temp 98 ?F (36.7 ?C)  - Resp 14  - Ht 5' 11 (1.803 m)  - Wt 104.3 kg (230 lb)  - SpO2 95%  - BMI 32.08 kg/m? Physical ExamVitals and nursing note reviewed. Constitutional:     Appearance: Normal appearance. HENT:    Head: Normocephalic. Eyes:    Extraocular Movements: Extraocular movements intact. Cardiovascular:    Rate and Rhythm: Normal rate and regular rhythm.    Pulses: Normal pulses.    Heart sounds: Normal heart sounds. Pulmonary:    Effort: Pulmonary effort is normal.    Breath sounds: Normal breath sounds. No wheezing, rhonchi or rales. Abdominal:    General: Bowel sounds are normal.    Palpations: Abdomen is soft.    Tenderness: There is no abdominal tenderness. Musculoskeletal:       General: Normal range of motion.    Cervical back: Neck supple.    Right lower leg: Edema present.    Left lower leg: Edema present. Skin:   General: Skin is warm and dry.    Findings: No rash. Neurological:    General: No focal deficit present.    Mental Status: He is alert and oriented to person, place, and time.  ProceduresAttestation/Critical CarePatient Reevaluation: EKG interpreted by me shows a sinus rhythm, heart rate of 57, no acute ST elevations or blocks.  Patient has no focal neuro deficits on examination.I reviewed laboratory results which are stable.  He has chronic renal failure at baseline.  Troponin x3 with a delta of 1.  Patient briefly had return of itching sensation and rash to his lower extremities.  IV Solu-Medrol and Benadryl given.  Symptoms resolved on reassessment.  CXR portable Final Result  Cardiac mediastinal silhouette is unremarkable.    No focal consolidation, atelectasis, pulmonary edema, pleural effusion or pneumothorax is seen.     Reported And Signed By: Madie Reno, MD      [facility] Radiology and Biomedical Imaging  On reassessment, patient's blood pressure transiently improved to the 180s then went back up above 200 again.  He does not have a headache and no neuro deficits on examination.  He states he believes his blood pressure is remaining elevated because he is here in the emergency department and really wants to go home.  States that it is not uncommon for him to have a blood pressure reading in the 200 range when he is at the doctor's office.  At home when he checks his blood pressure he gets readings from the 150s to 180s.  He states that his cardiologist called him back while he has been here in the emergency department, and they have called over a prescription for Coreg 12.5 milligrams twice a day.  He states that he is already heard from his pharmacy that the medication is ready.  He has been instructed to discontinue labetalol.  I ordered Coreg 12.5 milligrams orally.  He tolerated this well without any adverse effect.  Heart rate has been in the 60s to 70s, sinus on the monitor.  However blood pressure still did not improve.  IV hydralazine given, 10 milligrams.  Patient is on oral hydralazine as well.  On reassessment, blood pressure finally improved to 186/100.  Patient has very good follow-up with his cardiologist and his primary care physician.  He understands strict return precautions.  Allergic reaction precautions have also been given.  Prescription for prednisone for the next 4 days have been sent to the pharmacy.  Will discharge home.Critical care provided by attending: critical careCritical care time (minutes): 55.Critical care time was exclusive of separately billable procedures and teaching time.Critical care was necessary to treat or prevent imminent or life-threatening deterioration of the following conditions: hypertensive urgency.Critical care was time spent personally by me on the following activities: development of treatment plan with patient or surrogate, discussions with consultants, evaluation of patient's response to treatment, examination of patient, obtaining history from patient or surrogate, ordering and performing treatments and interventions, ordering and review of laboratory studies, ordering and review of radiographic studies, pulse oximetry and re-evaluation of patient's condition.Patient progress: improvedClinical Impressions as of 04/10/22 1941 Allergic reaction to drug, initial encounter Hypertensive urgency  ED DispositionDischarge Pilar Grammes, MD04/17/23 1941 Pilar Grammes, MD04/17/23 1941

## 2022-04-10 NOTE — Discharge Instructions
Prednisone for the next 4 days if you continue to have allergic reaction symptomsContinue benadryl AS NEEDED for itching/rashSTOP labetalol. START Coreg as prescribed by your cardiologistReturn for any new or worsening symptoms

## 2022-04-11 NOTE — ED Provider Notes
This is a 71 y.o. male who presents to [facility] with complaints of weakness and dizziness since this morning. Notes resolving rash to bilateral upper and lower extremities. Reports a change in his dose of Labetolol starting 3 days ago - changed from 2 to 3x a day. Denies difficulty swallowing, difficulty breathing, CP, or headache. Has taken Benadryl, last dose this morning.Physical ExamVitals and nursing note reviewed. Constitutional:     General: He is not in acute distress.   Appearance: Normal appearance. He is ill-appearing. He is not toxic-appearing or diaphoretic.    Comments: Profound weakness HENT:    Head: Atraumatic. Eyes:    General: No scleral icterus.Cardiovascular:    Rate and Rhythm: Normal rate and regular rhythm.    Heart sounds: Normal heart sounds. No murmur heard.   Comments: HypertensivePulmonary:    Effort: Pulmonary effort is normal. No respiratory distress.    Breath sounds: Normal breath sounds. No stridor. No wheezing, rhonchi or rales. Musculoskeletal:    Cervical back: Normal range of motion. Skin:   General: Skin is warm and dry.    Findings: No rash. Neurological:    General: No focal deficit present.    Mental Status: He is oriented to person, place, and time. He is lethargic. Psychiatric:       Mood and Affect: Mood normal.       Behavior: Behavior normal. ADVISED:Patient advised to go directly to [facility] ER for further diagnostic evaluation and management. Patient refused being transported by ambulance and states he has no one to transport him. He would prefer to transport himself and reports feeling capable of transporting himself.DISPOSITION: Transfer to Another FacilityBy signing my name below, I, Daphane Shepherd, attest that this documentation has been prepared under the direction and in the presence of Dr. Tonny Bollman, [place]04/17/23 This chart was generated with the assistance of a scribe. I was present and performed the services documented. I have reviewed the document and verified its accuracy.  Idalia Needle, MD04/18/23 [phone]

## 2022-04-18 ENCOUNTER — Ambulatory Visit: Payer: Medicare HMO

## 2022-04-21 ENCOUNTER — Ambulatory Visit (INDEPENDENT_AMBULATORY_CARE_PROVIDER_SITE_OTHER): Payer: Medicare HMO

## 2022-04-21 VITALS — BP 150/80 | HR 63 | Ht 68.0 in | Wt 164.4 lb

## 2022-04-21 DIAGNOSIS — Z0000 Encounter for general adult medical examination without abnormal findings: Secondary | ICD-10-CM | POA: Diagnosis not present

## 2022-04-21 NOTE — Patient Instructions (Signed)
Mr. Schiefelbein , ?Thank you for taking time to come for your Medicare Wellness Visit. I appreciate your ongoing commitment to your health goals. Please review the following plan we discussed and let me know if I can assist you in the future.  ? ?Screening recommendations/referrals: ?Colonoscopy: Cologuard 09/03/20 ?Recommended yearly ophthalmology/optometry visit for glaucoma screening and checkup ?Recommended yearly dental visit for hygiene and checkup ? ?Vaccinations: ?Influenza vaccine: 09/23/21 ?Pneumococcal vaccine: 08/25/21 ?Tdap vaccine: 06/22/17 ?Shingles vaccine: n/d   ?Covid-19: 02/04/20, 02/25/20, 10/12/20 ? ?Advanced directives: yes, requested copy ? ?Conditions/risks identified: none ? ?Next appointment: Follow up in one year for your annual wellness visit. 04/27/23 @ 9:15am in person ? ?Preventive Care 69 Years and Older, Male ?Preventive care refers to lifestyle choices and visits with your health care provider that can promote health and wellness. ?What does preventive care include? ?A yearly physical exam. This is also called an annual well check. ?Dental exams once or twice a year. ?Routine eye exams. Ask your health care provider how often you should have your eyes checked. ?Personal lifestyle choices, including: ?Daily care of your teeth and gums. ?Regular physical activity. ?Eating a healthy diet. ?Avoiding tobacco and drug use. ?Limiting alcohol use. ?Practicing safe sex. ?Taking low doses of aspirin every day. ?Taking vitamin and mineral supplements as recommended by your health care provider. ?What happens during an annual well check? ?The services and screenings done by your health care provider during your annual well check will depend on your age, overall health, lifestyle risk factors, and family history of disease. ?Counseling  ?Your health care provider may ask you questions about your: ?Alcohol use. ?Tobacco use. ?Drug use. ?Emotional well-being. ?Home and relationship well-being. ?Sexual  activity. ?Eating habits. ?History of falls. ?Memory and ability to understand (cognition). ?Work and work Astronomer. ?Screening  ?You may have the following tests or measurements: ?Height, weight, and BMI. ?Blood pressure. ?Lipid and cholesterol levels. These may be checked every 5 years, or more frequently if you are over 76 years old. ?Skin check. ?Lung cancer screening. You may have this screening every year starting at age 74 if you have a 30-pack-year history of smoking and currently smoke or have quit within the past 15 years. ?Fecal occult blood test (FOBT) of the stool. You may have this test every year starting at age 17. ?Flexible sigmoidoscopy or colonoscopy. You may have a sigmoidoscopy every 5 years or a colonoscopy every 10 years starting at age 76. ?Prostate cancer screening. Recommendations will vary depending on your family history and other risks. ?Hepatitis C blood test. ?Hepatitis B blood test. ?Sexually transmitted disease (STD) testing. ?Diabetes screening. This is done by checking your blood sugar (glucose) after you have not eaten for a while (fasting). You may have this done every 1-3 years. ?Abdominal aortic aneurysm (AAA) screening. You may need this if you are a current or former smoker. ?Osteoporosis. You may be screened starting at age 35 if you are at high risk. ?Talk with your health care provider about your test results, treatment options, and if necessary, the need for more tests. ?Vaccines  ?Your health care provider may recommend certain vaccines, such as: ?Influenza vaccine. This is recommended every year. ?Tetanus, diphtheria, and acellular pertussis (Tdap, Td) vaccine. You may need a Td booster every 10 years. ?Zoster vaccine. You may need this after age 94. ?Pneumococcal 13-valent conjugate (PCV13) vaccine. One dose is recommended after age 57. ?Pneumococcal polysaccharide (PPSV23) vaccine. One dose is recommended after age 73. ?Talk to your  health care provider about which  screenings and vaccines you need and how often you need them. ?This information is not intended to replace advice given to you by your health care provider. Make sure you discuss any questions you have with your health care provider. ?Document Released: 01/07/2016 Document Revised: 08/30/2016 Document Reviewed: 10/12/2015 ?Elsevier Interactive Patient Education ? 2017 [place]. ? ?Fall Prevention in the Home ?Falls can cause injuries. They can happen to people of all ages. There are many things you can do to make your home safe and to help prevent falls. ?What can I do on the outside of my home? ?Regularly fix the edges of walkways and driveways and fix any cracks. ?Remove anything that might make you trip as you walk through a door, such as a raised step or threshold. ?Trim any bushes or trees on the path to your home. ?Use bright outdoor lighting. ?Clear any walking paths of anything that might make someone trip, such as rocks or tools. ?Regularly check to see if handrails are loose or broken. Make sure that both sides of any steps have handrails. ?Any raised decks and porches should have guardrails on the edges. ?Have any leaves, snow, or ice cleared regularly. ?Use sand or salt on walking paths during winter. ?Clean up any spills in your garage right away. This includes oil or grease spills. ?What can I do in the bathroom? ?Use night lights. ?Install grab bars by the toilet and in the tub and shower. Do not use towel bars as grab bars. ?Use non-skid mats or decals in the tub or shower. ?If you need to sit down in the shower, use a plastic, non-slip stool. ?Keep the floor dry. Clean up any water that spills on the floor as soon as it happens. ?Remove soap buildup in the tub or shower regularly. ?Attach bath mats securely with double-sided non-slip rug tape. ?Do not have throw rugs and other things on the floor that can make you trip. ?What can I do in the bedroom? ?Use night lights. ?Make sure that you have a  light by your bed that is easy to reach. ?Do not use any sheets or blankets that are too big for your bed. They should not hang down onto the floor. ?Have a firm chair that has side arms. You can use this for support while you get dressed. ?Do not have throw rugs and other things on the floor that can make you trip. ?What can I do in the kitchen? ?Clean up any spills right away. ?Avoid walking on wet floors. ?Keep items that you use a lot in easy-to-reach places. ?If you need to reach something above you, use a strong step stool that has a grab bar. ?Keep electrical cords out of the way. ?Do not use floor polish or wax that makes floors slippery. If you must use wax, use non-skid floor wax. ?Do not have throw rugs and other things on the floor that can make you trip. ?What can I do with my stairs? ?Do not leave any items on the stairs. ?Make sure that there are handrails on both sides of the stairs and use them. Fix handrails that are broken or loose. Make sure that handrails are as long as the stairways. ?Check any carpeting to make sure that it is firmly attached to the stairs. Fix any carpet that is loose or worn. ?Avoid having throw rugs at the top or bottom of the stairs. If you do have throw rugs, attach them  to the floor with carpet tape. ?Make sure that you have a light switch at the top of the stairs and the bottom of the stairs. If you do not have them, ask someone to add them for you. ?What else can I do to help prevent falls? ?Wear shoes that: ?Do not have high heels. ?Have rubber bottoms. ?Are comfortable and fit you well. ?Are closed at the toe. Do not wear sandals. ?If you use a stepladder: ?Make sure that it is fully opened. Do not climb a closed stepladder. ?Make sure that both sides of the stepladder are locked into place. ?Ask someone to hold it for you, if possible. ?Clearly mark and make sure that you can see: ?Any grab bars or handrails. ?First and last steps. ?Where the edge of each step  is. ?Use tools that help you move around (mobility aids) if they are needed. These include: ?Canes. ?Walkers. ?Scooters. ?Crutches. ?Turn on the lights when you go into a dark area. Replace any light bulbs as soon a

## 2022-04-21 NOTE — Progress Notes (Signed)
? ?Subjective:  ? Russell [name]. is a 71 y.o. male who presents for Medicare Annual/Subsequent preventive examination. ? ?Review of Systems    ? ?  ? ?   ?Objective:  ?  ?There were no vitals filed for this visit. ?There is no height or weight on file to calculate BMI. ? ? ?  04/12/2021  ? 11:47 AM 05/07/2019  ? 10:38 AM  ?Advanced Directives  ?Does Patient Have a Medical Advance Directive? Yes No  ?Type of Paramedic of [place];Living will   ?Copy of [place] in Chart? No - copy requested   ?Would patient like information on creating a medical advance directive?  No - Patient declined  ? ? ?Current Medications (verified) ?Outpatient Encounter Medications as of 04/21/2022  ?Medication Sig  ? amiodarone (PACERONE) 200 MG tablet TAKE 1/2 TABLET EVERY DAY  ? Blood Glucose Monitoring Suppl (ONE TOUCH ULTRA 2) w/Device KIT Use to check blood sugar x 1 daily  ? brimonidine (ALPHAGAN) 0.2 % ophthalmic solution Place 1 drop into both eyes 2 (two) times daily.  ? carvedilol (COREG) 25 MG tablet Take 1 tablet (25 mg total) by mouth 2 (two) times daily.  ? Cholecalciferol (VITAMIN D3) 2000 units TABS Take 1 capsule by mouth daily.   ? dorzolamide-timolol (COSOPT) 22.3-6.8 MG/ML ophthalmic solution Place 1 drop into both eyes 2 (two) times daily.  ? doxazosin (CARDURA) 1 MG tablet Take 1 tablet (1 mg total) by mouth 2 (two) times daily.  ? doxazosin (CARDURA) 1 MG tablet Take 1 tablet (1 mg total) by mouth 2 (two) times daily. (Patient not taking: Reported on 04/05/2022)  ? isosorbide mononitrate (IMDUR) 60 MG 24 hr tablet Take 1.5 tablets (90 mg total) by mouth 2 (two) times daily.  ? isosorbide mononitrate (IMDUR) 60 MG 24 hr tablet Take 1.5 tablets (90 mg total) by mouth 2 (two) times daily. (Patient not taking: Reported on 04/05/2022)  ? latanoprost (XALATAN) 0.005 % ophthalmic solution Place 1 drop into both eyes at bedtime.   ? metFORMIN (GLUCOPHAGE-XR) 500 MG 24 hr tablet TAKE 1  TABLET BY MOUTH EVERY DAY WITH BREAKFAST  ? nitroGLYCERIN (NITROSTAT) 0.4 MG SL tablet DISSOLVE ONE TABLET UNDER THE TONGUE EVERY 5 MINUTES AS NEEDED FOR CHEST PAIN.  DO NOT EXCEED A TOTAL OF 3 DOSES IN 15 MINUTES  ? OneTouch Delica Lancets 09M MISC Use to check blood sugar up to 1 time daily  ? ONETOUCH ULTRA test strip Check blood sugar 1 x daily  ? polyvinyl alcohol (LIQUIFILM TEARS) 1.4 % ophthalmic solution Place 2 drops into both eyes every 8 (eight) hours.  ? pravastatin (PRAVACHOL) 10 MG tablet Take 1 tablet (10 mg total) by mouth at bedtime.  ? sacubitril-valsartan (ENTRESTO) 97-103 MG Take 1 tablet by mouth 2 (two) times daily.  ? spironolactone (ALDACTONE) 25 MG tablet Take 1 tablet (25 mg total) by mouth daily.  ? tamsulosin (FLOMAX) 0.4 MG CAPS capsule Take 0.4 mg by mouth daily.  ? timolol (TIMOPTIC) 0.5 % ophthalmic solution Place 1 drop into both eyes 2 (two) times daily.   ? ?No facility-administered encounter medications on file as of 04/21/2022.  ? ? ?Allergies (verified) ?Patient has no known allergies.  ? ?History: ?Past Medical History:  ?Diagnosis Date  ? CAD (coronary artery disease)   ? 05/07/2019 LHC showed nonobstructive CAD  ? Diabetes ([place])   ? Frequent PVCs   ? Heart failure with reduced ejection fraction ([place])   ?  04/2019 EF 20-25%  ? Hypertension   ? Impotence of organic origin   ? NICM (nonischemic cardiomyopathy) ([place])   ? HFrEF in setting of NICM  ? Pain in joint, shoulder region   ? Personal history of colonic polyps   ? Ventricular tachycardia ([place])   ? 04/2019 in setting of cardiac arrest  ? ?Past Surgical History:  ?Procedure Laterality Date  ? CARDIAC CATHETERIZATION  11/17/2013  ? COLONOSCOPY  2009  ? ICD IMPLANT N/A 05/08/2019  ? Procedure: ICD IMPLANT;  Surgeon: Thompson Grayer, MD;  Location: [facility];  Service: Cardiovascular;  Laterality: N/A;  ? RIGHT/LEFT HEART CATH AND CORONARY ANGIOGRAPHY N/A 05/07/2019  ? Procedure: RIGHT/LEFT HEART CATH AND CORONARY ANGIOGRAPHY;   Surgeon: Nelva Bush, MD;  Location: [facility];  Service: Cardiovascular;  Laterality: N/A;  ? ?Family History  ?Problem Relation Age of Onset  ? Colon cancer Father   ?     49's  ? Hypertension Mother   ? Diabetes Mother   ? ?Social History  ? ?Socioeconomic History  ? Marital status: Married  ?  Spouse name: Not on file  ? Number of children: Not on file  ? Years of education: Not on file  ? Highest education level: Not on file  ?Occupational History  ? Occupation: [employer]  ? Occupation: retired  ?Tobacco Use  ? Smoking status: Never  ? Smokeless tobacco: Never  ?Vaping Use  ? Vaping Use: Never used  ?Substance and Sexual Activity  ? Alcohol use: Yes  ?  Comment: occassional  ? Drug use: No  ? Sexual activity: Not on file  ?Other Topics Concern  ? Not on file  ?Social History Narrative  ? Not on file  ? ?Social Determinants of Health  ? ?Financial Resource Strain: Low Risk   ? Difficulty of Paying Living Expenses: Not hard at all  ?Food Insecurity: No Food Insecurity  ? Worried About Charity fundraiser in the Last Year: Never true  ? Ran Out of Food in the Last Year: Never true  ?Transportation Needs: No Transportation Needs  ? Lack of Transportation (Medical): No  ? Lack of Transportation (Non-Medical): No  ?Physical Activity: Insufficiently Active  ? Days of Exercise per Week: 4 days  ? Minutes of Exercise per Session: 30 min  ?Stress: No Stress Concern Present  ? Feeling of Stress : Not at all  ?Social Connections: Not on file  ? ? ?Tobacco Counseling ?Counseling given: Not Answered ? ? ?Clinical Intake: ? ?Pre-visit preparation completed: Yes ? ?Pain : No/denies pain ? ?  ? ?Nutritional Risks: None ?Diabetes: Yes ?CBG done?: No ?Did pt. bring in CBG monitor from home?: No ? ?How often do you need to have someone help you when you read instructions, pamphlets, or other written materials from your doctor or pharmacy?: 1 - Never ? ?Diabetic?yes ?Nutrition Risk Assessment: ? ?Has the  patient had any N/V/D within the last 2 months?  No  ?Does the patient have any non-healing wounds?  No  ?Has the patient had any unintentional weight loss or weight gain?  No  ? ?Diabetes: ? ?Is the patient diabetic?  Yes  ?If diabetic, was a CBG obtained today?  No  ?Did the patient bring in their glucometer from home?  No  ?How often do you monitor your CBG's? Once per day.  ? ?Financial Strains and Diabetes Management: ? ?Are you having any financial strains with the device, your supplies  or your medication? No .  ?Does the patient want to be seen by Chronic Care Management for management of their diabetes?  No  ?Would the patient like to be referred to a Nutritionist or for Diabetic Management?  No  ? ?Diabetic Exams: ? ?Diabetic Eye Exam: Completed 08/17/21.  Pt has been advised about the importance in completing this exam.  ? ?Diabetic Foot Exam: Completed 08/25/21. Pt has been advised about the importance in completing this exam.   ? ? ?Interpreter Needed?: No ? ?Information entered by :: Kirke Shaggy, LPN ? ? ?Activities of Daily Living ?   ? View : No data to display.  ?  ?  ?  ? ? ?Patient Care Team: ?Olin Hauser, DO as PCP - General (Family Medicine) ?Thompson Grayer, MD as PCP - Cardiology (Cardiology) ?Thompson Grayer, MD as PCP - Electrophysiology (Cardiology) ?Minna Merritts, MD as Consulting Physician (Cardiology) ? ?Indicate any recent Medical Services you may have received from other than [facility] providers in the past year (date may be approximate). ? ?   ?Assessment:  ? This is a routine wellness examination for Russell [name]. ? ?Hearing/Vision screen ?No results found. ? ?Dietary issues and exercise activities discussed: ?  ? ? Goals Addressed   ?None ?  ? ?Depression Screen ? ?  08/25/2021  ?  9:08 AM 04/12/2021  ? 11:47 AM 08/19/2020  ?  9:15 AM 08/22/2019  ?  3:51 PM 05/22/2019  ? 10:28 AM 12/30/2018  ?  3:33 PM 06/24/2018  ?  1:41 PM  ?PHQ 2/9 Scores  ?PHQ - 2 Score 0 0 0 0 0 0 0  ?PHQ- 9 Score 0         ?  ?Fall Risk ? ?  08/25/2021  ?  9:08 AM 04/12/2021  ? 11:47 AM 02/18/2021  ?  8:59 AM 08/19/2020  ?  9:00 AM 08/22/2019  ?  3:51 PM  ?Fall Risk   ?Falls in the past year? 0 0 0 0 0  ?Number falls in past

## 2022-04-24 ENCOUNTER — Ambulatory Visit (INDEPENDENT_AMBULATORY_CARE_PROVIDER_SITE_OTHER): Payer: Medicare HMO

## 2022-04-24 DIAGNOSIS — I5022 Chronic systolic (congestive) heart failure: Secondary | ICD-10-CM

## 2022-04-24 DIAGNOSIS — Z95810 Presence of automatic (implantable) cardiac defibrillator: Secondary | ICD-10-CM

## 2022-04-26 ENCOUNTER — Telehealth: Payer: Self-pay

## 2022-04-26 NOTE — Telephone Encounter (Signed)
Prior Authorization sent for: ?Offie Waide [place]: [facility] [place] Case ID: [id number] - Rx #: [id number] ?Waiting for approval  ?

## 2022-04-27 ENCOUNTER — Telehealth: Payer: Self-pay

## 2022-04-27 NOTE — Telephone Encounter (Signed)
Prior authorization for Doxazosin is approved until 12/24/2022. ?Patient notified of approval as well as the pharmacy.  ?

## 2022-04-28 ENCOUNTER — Telehealth: Payer: Self-pay

## 2022-04-28 NOTE — Progress Notes (Signed)
EPIC Encounter for ICM Monitoring ? ?Patient Name: Russell [name]. is a 71 y.o. male ?Date: 04/28/2022 ?Primary Care Physican: Smitty Cords, DO ?Primary Cardiologist: Mariah Milling ?Electrophysiologist: Allred ?02/15/2022 Weight: 158-160 lbs  ?                                                         ?Attempted call to patient and unable to reach.  Transmission reviewed.   ?  ?Optivol thoracic impedance suggesting normal fluid levels.  ?  ?Prescribed: ?Spironolactone 25 mg take 1 tablet daily ?  ?Labs: ?11/15/2021 Creatinine 1.20, BUN 18, Potassium 3.9, Sodium 141, GFR 66 ?03/23/2021 Creatinine 1.48, BUN 15, Potassium 4.7, Sodium 140, GFR 52 ?A complete set of results can be found in Results Review. ?  ?Recommendations:  Unable to reach.   ?  ?Follow-up plan: ICM clinic phone appointment on 05/29/2022.   91 day device clinic remote transmission 05/08/2022.  ?  ?EP/Cardiology Next Office Visit:     08/08/2022 with Dr Mariah Milling.  Recall 10/02/2022 with Dr Lalla Brothers.   ?  ?Copy of ICM check sent to Dr. Johney Frame.  ? ?3 month ICM trend: 04/24/2022. ? ? ? ?12-14 Month ICM trend:  ? ? ? ?[name], RN ?04/28/2022 ?11:02 AM ? ?

## 2022-04-28 NOTE — Telephone Encounter (Signed)
Remote ICM transmission received.  Attempted call to patient regarding ICM remote transmission and no answer or voice mail set up. 

## 2022-05-05 ENCOUNTER — Encounter: Admit: 2022-05-05 | Payer: PRIVATE HEALTH INSURANCE | Attending: Medical | Primary: Internal Medicine

## 2022-05-05 ENCOUNTER — Ambulatory Visit: Admit: 2022-05-05 | Payer: MEDICARE | Attending: Medical | Primary: Internal Medicine

## 2022-05-05 DIAGNOSIS — N399 Disorder of urinary system, unspecified: Secondary | ICD-10-CM

## 2022-05-05 DIAGNOSIS — I10 Essential (primary) hypertension: Secondary | ICD-10-CM

## 2022-05-05 MED ORDER — CARVEDILOL IMMEDIATE RELEASE 12.5 MG TABLET
12.5 mg | Status: AC
Start: 2022-05-05 — End: ?

## 2022-05-05 NOTE — Progress Notes
Marc Evans is a 71 y.o. male with history of urinary retention with bilateral hydronephrosis and renal failure requiring bilateral nephrostomy tubes who ultimately underwent HOLEP surgery with Dr. Oliva Bustard on 05/18/21. His post-op renal function leveled out at a serum creatinine in the 2.5 to 3.0 range and eGFR in the 21-25 range.His voiding pattern improved after surgery with self-reported strong stream without urinary incontinence, hematuria or dysuria. Pathology of prostatic chips demonstrated BPH, 160 grams in aggregate.  A follow-up renal ultrasound on 11/13/2021 showed no evidence of nephrolithiasis or hydronephrosis.  There was diffuse thickening of the bladder wall.  The PVR was low.He did well until 03/24/2022 when he developed acute pain and swelling of the left testicle.  He presented to the emergency room where a scrotal ultrasound demonstrated left epididymal orchitis. He was treated with levofloxacin.He was last seen in the urology clinic on 04/04/2022.  Things were improving at that point.  Exam showed reduced tenderness and swelling of the left testicle.The patient is here today for follow-up in Urology.  He notes continued improvement. He has no pain at all.  Swelling is minimal. I'm 95% better.  Pain is minimal. He denies any fever or chills, chest pain, nausea or vomiting, flank pain, abdominal pain, hematuria or dysuria.  He is eating and drinking well.PMH significant for HTN.  Denies DM. He is a non-smoker and does not use alcohol or drugs.Past Medical History: Diagnosis Date ? High blood pressure  ? Hypertension  Past Surgical History: Procedure Laterality Date ? TONSILLECTOMY    as a child ? 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 No Known AllergiesReview Of SystemsReview of Systems As per HPI.Physical ExamHt 5' 11 (1.803 m)  - Wt 104.3 kg  - BMI 32.08 kg/m? Constitutional: Oriented to person, place, and time. Appears well-developed and well-nourished. HENT: Head: Normocephalic and atraumatic. Eyes: Conjunctivae are normal. Neck: No tracheal deviation present. Pulmonary/Chest: Effort normal. Abdominal: Soft and non-tender.Genital:  No inguinal adenopathy.  No genital rashes or sores.  Redundant foreskin.  Glans without lesions.  Normal appearing scrotum.  Unremarkable right testis.  Minimal residual left testicular swelling without any masses or tenderness.  Normal testicular adnexa.  No inguinal hernias.Musculoskeletal: Normal range of motion. Neurological: alert and oriented to person, place, and time. No focal deficits are evident. Skin: Skin is dry. Psychiatric: Has a normal mood and affect. Behavior is normal. Judgment and thought content normal. Post Void Bladder Scan Measurement Date Value Ref Range Status 05/05/2022 27 mL Final PSA - 1.040 on 10/20/2022US SCROTUM AND TESTICLES WITH LIMITED DOPPLER 03/24/2022?HISTORY: testicular pain. ?COMPARISON: None.?TECHNIQUE: Grayscale, color and pulsed Doppler imaging were performed.?FINDINGS: ?RIGHT TESTIS: Size (in cm): 4.4 cm x 3.0 cm x 2.6 cm.Echogenicity: Normal. Vascularity: Increased.Focal lesions: None. Resistive index: 0.8?LEFT TESTIS: Size (in cm): 4.2 cm x 2.9 cm x 3.4 cm.Echogenicity: Normal. Vascularity: Increased.Focal lesions: None. Resistive index: 0.7?RIGHT EPIDIDYMIS: Size of head (in cm): 0.9 cm x 1.0 cm x 1.4 cm.Echogenicity: Normal.Vascularity: Increased Cysts: None. ?LEFT EPIDIDYMIS: Size of head (in cm): 0.8 cm x 1.8 cm x 1.4 cm.Echogenicity: Normal.Vascularity: Increased Cysts: None. ?OTHER FINDINGS: Varicocele: Not present. Hydrocele: There is a small right-sided hydrocele. There is a complex left-sided hydrocele seen. ?IMPRESSION: ?Bilateral left greater than right epididymoorchitis with associated complex left-sided hydrocele and a simple right-sided hydrocele.?Marc Evans Radiology Notify System Classification: Routine?Report Initiated By:  Marc Pollen, MD?Reported And Signed By: Marc Reno, MD  [facility]?Assessment & Plan:Marc Evans is a 71 y.o.non-diabetic male with HTN s/p HOLEP for management  of urinary retention with renal failure.  He resumed effective volitional voiding after surgery.He developed acute left epididymal orchitis on 03/24/2022 now essentially resolved. Patient states that he was pulled over by the police when leaving the emergency room on 03/25/2022 and ticketed for not wearing a seatbelt.  He says he always wears a seat belt but it was just too painful having the strap crosses lower abdomen that day.  I wrote him a note today explaining all of this.At this time the patient will follow-up in 1 year for recheck.Strict call back and ER parameters were reviewed with the patient.  He was encouraged to contact us in the interim should he have any questions, concerns or new symptoms.Note: As patient was leaving he complained that he has bilateral lower extremity edema with some stiffness in the right leg.  He was instructed to report to the emergency room to rule out DVT.  Patient said he would do this.Encounter Diagnoses Name SNOMED [place](R) Primary? ? Disease of urinary tract DISORDER OF URINARY TRACT Yes  Encounter time: 25 minutes were spent reviewing the chart, taking a history, examining the patient, counseling the patient and making medical decisions, and coordinating follow up care.Marc Hight, PA

## 2022-05-08 ENCOUNTER — Ambulatory Visit (INDEPENDENT_AMBULATORY_CARE_PROVIDER_SITE_OTHER): Payer: Medicare HMO

## 2022-05-08 DIAGNOSIS — I428 Other cardiomyopathies: Secondary | ICD-10-CM

## 2022-05-09 LAB — CUP PACEART REMOTE DEVICE CHECK
Battery Remaining Longevity: 114 mo
Battery Voltage: 3.01 V
Brady Statistic RV Percent Paced: 0.1 %
Date Time Interrogation Session: 20230515043823
HighPow Impedance: 56 Ohm
Implantable Lead Implant Date: 20200514
Implantable Lead Location: 753860
Implantable Lead Model: 6935
Implantable Pulse Generator Implant Date: 20200514
Lead Channel Impedance Value: 304 Ohm
Lead Channel Impedance Value: 380 Ohm
Lead Channel Pacing Threshold Amplitude: 0.875 V
Lead Channel Pacing Threshold Pulse Width: 0.4 ms
Lead Channel Sensing Intrinsic Amplitude: 7.875 mV
Lead Channel Sensing Intrinsic Amplitude: 7.875 mV
Lead Channel Setting Pacing Amplitude: 2 V
Lead Channel Setting Pacing Pulse Width: 0.4 ms
Lead Channel Setting Sensing Sensitivity: 0.3 mV

## 2022-05-10 ENCOUNTER — Telehealth: Admit: 2022-05-10 | Payer: PRIVATE HEALTH INSURANCE | Attending: Medical | Primary: Internal Medicine

## 2022-05-10 NOTE — Telephone Encounter
Pt would like a call back he states that he a PSA Test done on 10/13/21 and he was charged 189.00 for the test . The billing Representative states that he signed a ABM .(?)Patient is not sure what that is. He wants to speak with Marc Evans  Regarding this matter.

## 2022-05-10 NOTE — Telephone Encounter
Patient called back very upset that he was hung up on. He stated was having a conversation with someone and does not recall the name. Patient stated he spoke with Greggory Stallion and the billing department ,and was advised to contact his insurance company.

## 2022-05-12 ENCOUNTER — Ambulatory Visit (HOSPITAL_COMMUNITY): Admission: RE | Admit: 2022-05-12 | Payer: Medicare HMO | Source: Ambulatory Visit

## 2022-05-19 ENCOUNTER — Ambulatory Visit (HOSPITAL_COMMUNITY)
Admission: RE | Admit: 2022-05-19 | Discharge: 2022-05-19 | Disposition: A | Discharge status: Home / Self Care | Payer: Medicare HMO | Source: Ambulatory Visit | Attending: Urology | Admitting: Urology

## 2022-05-19 DIAGNOSIS — R972 Elevated prostate specific antigen [PSA]: Secondary | ICD-10-CM | POA: Insufficient documentation

## 2022-05-19 DIAGNOSIS — R590 Localized enlarged lymph nodes: Secondary | ICD-10-CM | POA: Diagnosis not present

## 2022-05-19 DIAGNOSIS — N281 Cyst of kidney, acquired: Secondary | ICD-10-CM | POA: Diagnosis not present

## 2022-05-19 MED ORDER — GADOBUTROL 1 MMOL/ML IV SOLN
7.5000 mL | Freq: Once | INTRAVENOUS | Status: AC | PRN
  Administered 2022-05-19: 7.5 mL via INTRAVENOUS

## 2022-05-19 NOTE — Progress Notes (Signed)
Per order, Changed device settings for MRI to  °OVO  °MRI mode/Tachy-therapies to off  °Will program device back to pre-MRI settings after completion of exam, and send transmission °

## 2022-05-23 DIAGNOSIS — N5201 Erectile dysfunction due to arterial insufficiency: Secondary | ICD-10-CM | POA: Diagnosis not present

## 2022-05-23 DIAGNOSIS — R972 Elevated prostate specific antigen [PSA]: Secondary | ICD-10-CM | POA: Diagnosis not present

## 2022-05-23 DIAGNOSIS — N401 Enlarged prostate with lower urinary tract symptoms: Secondary | ICD-10-CM | POA: Diagnosis not present

## 2022-05-26 NOTE — Progress Notes (Signed)
Remote ICD transmission.   

## 2022-05-26 NOTE — Telephone Encounter
Patient called in stating that he spoke with insurance company, received the appeal form, and patient called before he sent in appeal back to Medicare, Medicare stated that he needs documentation that from the lab, office visits DX code needs to be submitted. Patient is aware that he may have signed the form that he would be responsible for the charges, patient stated he was just of the ICU at that point and he was told he needed the test to check if he had prostate CA or not. Patient is asking for a call back to inquire if DX can be changed, resubmitted or should he sent in previous lab/offfice visit note with the appeal to medicare, patient can be reached @ [phone]

## 2022-05-29 ENCOUNTER — Ambulatory Visit (INDEPENDENT_AMBULATORY_CARE_PROVIDER_SITE_OTHER): Payer: Medicare HMO

## 2022-05-29 DIAGNOSIS — Z95810 Presence of automatic (implantable) cardiac defibrillator: Secondary | ICD-10-CM

## 2022-05-29 DIAGNOSIS — I5022 Chronic systolic (congestive) heart failure: Secondary | ICD-10-CM | POA: Diagnosis not present

## 2022-05-30 NOTE — Telephone Encounter
Spoke with Marc Evans who stated he got a PSA lab done on 10/13/2021 and Medicare denied it because the Diagnosis code didn't support the test. He said he spoke with Jacklynn Bue who told him it was medically necessary. He also stated he thinks he signed a form accepting monitory responsibility for the charge. He said the appeal is looking for documentation supporting medical necessity of the PSA lab. This nurse asked if he looked at the provider note in MyChart or called medical records. The patient then asked why are you calling. Explained that his call said 'patient asking for a call back'. He said you are just asking me questions. Asked the patient if he wanted to speak with someone else or Jacklynn Bue, he said again he already spoke to [place]. Told him sorry I couldn't help and to take care.

## 2022-05-31 ENCOUNTER — Telehealth: Admit: 2022-05-31 | Payer: PRIVATE HEALTH INSURANCE | Attending: Medical | Primary: Internal Medicine

## 2022-05-31 ENCOUNTER — Ambulatory Visit: Payer: Medicare HMO | Admitting: Family Medicine

## 2022-05-31 NOTE — Progress Notes (Signed)
EPIC Encounter for ICM Monitoring  Patient Name: Russell [name]. is a 71 y.o. male Date: 05/31/2022 Primary Care Physican: Olin Hauser, DO Primary Cardiologist: Rockey Situ Electrophysiologist: Allred 02/15/2022 Weight: 158-160 lbs  05/31/2022 Weight: 164 lbs                                                          Spoke with patient and heart failure questions reviewed.  Pt asymptomatic for fluid accumulation.  Reports feeling well at this time and voices no complaints.    Optivol thoracic impedance suggesting normal fluid levels.    Prescribed: Spironolactone 25 mg take 1 tablet daily   Labs: 11/15/2021 Creatinine 1.20, BUN 18, Potassium 3.9, Sodium 141, GFR 66 03/23/2021 Creatinine 1.48, BUN 15, Potassium 4.7, Sodium 140, GFR 52 A complete set of results can be found in Results Review.   Recommendations:  No changes and encouraged to call if experiencing any fluid symptoms.   Follow-up plan: ICM clinic phone appointment on 07/03/2022.   91 day device clinic remote transmission 08/07/2022.    EP/Cardiology Next Office Visit:  08/08/2022 with Dr Rockey Situ.  Recall 10/02/2022 with Dr Quentin Ore.     Copy of ICM check sent to Dr. Rayann Heman.  3 month ICM trend: 05/29/2022.    12-14 Month ICM trend:     Rosalene Billings, RN 05/31/2022 3:25 PM

## 2022-05-31 NOTE — Telephone Encounter
Sent an email to Building control surveyor

## 2022-05-31 NOTE — Telephone Encounter
.  [facility] MESSAGETime of call:   2:50 PMCaller:   DavidCaller's relationship to patient: Ambulance person from (pharmacy, hospital, agency, etc.):Reason for call:   Medicare is telling Patient on the DOS of November 09, 2021 that it was coded incorrectly and they want to know if it was for preventative or diagnostic?  Pt would like a call back with an updateIf not feeling well, what are symptoms: If having symptoms, how long have the symptoms been present:   Does caller request to speak to someone urgently?     If yes, warm transferred to:  Provider:  Jacqualine Code clinic visit date:  05/12/23Best telephone number for callback:   [phone] time to return call:   anyPermission to leave message:  yes Florentina Addison

## 2022-06-05 ENCOUNTER — Ambulatory Visit: Payer: Medicare HMO | Admitting: Family Medicine

## 2022-06-05 NOTE — Telephone Encounter
Patient called in stating he received a text message regarding a past due balance. I advised the patient that message is sent from the billing department. He is aware the order change ( re code is being worked on) . I advised him to f/u with billing if need be as they are responsible for resubmitting the claim to the insurance.

## 2022-06-05 NOTE — Telephone Encounter
Message has been sent to Billing and they will keep the Leads and myself updated on the status in regards to old lab from 2021/10/24 to be re-coded, so Medicare will cover it.

## 2022-06-14 ENCOUNTER — Other Ambulatory Visit: Payer: Self-pay | Admitting: Family Medicine

## 2022-06-14 ENCOUNTER — Ambulatory Visit (INDEPENDENT_AMBULATORY_CARE_PROVIDER_SITE_OTHER): Payer: Medicare HMO | Admitting: Family Medicine

## 2022-06-14 ENCOUNTER — Encounter: Payer: Self-pay | Admitting: Family Medicine

## 2022-06-14 VITALS — BP 136/80 | HR 63 | Ht 68.0 in | Wt 164.4 lb

## 2022-06-14 DIAGNOSIS — I5022 Chronic systolic (congestive) heart failure: Secondary | ICD-10-CM

## 2022-06-14 DIAGNOSIS — I428 Other cardiomyopathies: Secondary | ICD-10-CM | POA: Diagnosis not present

## 2022-06-14 DIAGNOSIS — I10 Essential (primary) hypertension: Secondary | ICD-10-CM

## 2022-06-14 DIAGNOSIS — N401 Enlarged prostate with lower urinary tract symptoms: Secondary | ICD-10-CM

## 2022-06-14 DIAGNOSIS — I420 Dilated cardiomyopathy: Secondary | ICD-10-CM | POA: Diagnosis not present

## 2022-06-14 DIAGNOSIS — E7800 Pure hypercholesterolemia, unspecified: Secondary | ICD-10-CM

## 2022-06-14 DIAGNOSIS — E1136 Type 2 diabetes mellitus with diabetic cataract: Secondary | ICD-10-CM

## 2022-06-14 DIAGNOSIS — Z0000 Encounter for general adult medical examination without abnormal findings: Secondary | ICD-10-CM

## 2022-06-14 DIAGNOSIS — R7989 Other specified abnormal findings of blood chemistry: Secondary | ICD-10-CM

## 2022-06-14 DIAGNOSIS — R972 Elevated prostate specific antigen [PSA]: Secondary | ICD-10-CM

## 2022-06-14 LAB — POCT GLYCOSYLATED HEMOGLOBIN (HGB A1C): Hemoglobin A1C: 8.5 % — AB (ref 4.0–5.6)

## 2022-06-14 MED ORDER — METFORMIN HCL ER 500 MG PO TB24: 1000.0000 mg | ORAL_TABLET | Freq: Every day | ORAL | 3 refills | Status: DC

## 2022-06-14 NOTE — Patient Instructions (Addendum)
Thank you for coming to the office today.  Recent Labs    08/18/21 0758 11/15/21 0801 06/14/22 1055  HGBA1C 7.4* 7.7* 8.5*    Dose increase Metformin XR 500mg  from once daily up to two pills at one time in morning with meal. For max dose of 1000mg .  If not quite optimal result in future we can consider the newer medications.   Call insurance find cost and coverage of the following - check the following: - Drug Tier, Preferred List, On Formulary - All will require a "Prior Authorization" from first, before you can find out the cost - Find out if there is "Step Therapy" (other medicines required before you can try these)   Diabetes medications  Jardiance (preferred) or Farxiga (not preferred)- recommended by heart doctors for both sugar and heart, it helps you reduce fluid   Ozempic (Semaglutide injection)   Trulicity (Dulaglutide)  Mounjaro weekly injection  Rybelsus (pill - oral semaglutide or ozempic) - once daily, taken first thing in morning without other meds   Please schedule a Follow-up Appointment to: Return in about 4 months (around 10/14/2022) for 4 month fasting lab only then 1 week later Annual Physical.  If you have any other questions or concerns, please feel free to call the office or send a message through MyChart. You may also schedule an earlier appointment if necessary.  Additionally, you may be receiving a survey about your experience at our office within a few days to 1 week by e-mail or mail. We value your feedback.  12-29-2002, DO [facility], Saralyn Pilar

## 2022-06-14 NOTE — Progress Notes (Signed)
Subjective:    Patient ID: Russell Joy., male    DOB: 1951/04/28, 71 y.o.   MRN: [id number]  Russell Spreen. is a 71 y.o. male presenting on 06/14/2022 for Diabetes   HPI  CHRONIC DM, Type 2 with cataracts / Atherosclerotic vascular disease   Interval update since last visit, previously had A1c 7 range. Now today due for A1c Admits some wt gain CBGs: Checks CBG every other day, no new readings lately, question if accurate he said some readings 70s Meds: Metformin 500mg  XR daily, previously on BID dosing. Reports good compliance. Tolerating well w/o side-effects Currently on ACEi He is tolerating it well. On Pravastatin 10mg  nightly, no side effects or myalgia Improved LDL Has OneTouch Ultra 2 since last year. Lifestyle:  - Diet (tries to follow DM diet, occasional fried food) - Exercise (He has a gym pass and plans to resume exercise) - He is now followed by [facility] Dr see below, for glaucoma and cataracts Denies hypoglycemia, polyuria, visual changes, numbness or tingling.      04/21/2022    9:46 AM 08/25/2021    9:08 AM 04/12/2021   11:47 AM  Depression screen PHQ 2/9  Decreased Interest 0 0 0  Down, Depressed, Hopeless 0 0 0  PHQ - 2 Score 0 0 0  Altered sleeping  0   Tired, decreased energy  0   Change in appetite  0   Feeling bad or failure about yourself   0   Trouble concentrating  0   Moving slowly or fidgety/restless  0   Suicidal thoughts  0   PHQ-9 Score  0   Difficult doing work/chores  Not difficult at all     Social History   Tobacco Use   Smoking status: Never   Smokeless tobacco: Never  Vaping Use   Vaping Use: Never used  Substance Use Topics   Alcohol use: Yes    Comment: occassional   Drug use: No    Review of Systems Per HPI unless specifically indicated above     Objective:    BP 136/80 (BP Location: Left Arm, Cuff Size: Normal)   Pulse 63   Ht 5\' 8"  (1.727 m)   Wt 164 lb 6.4 oz (74.6 kg)   SpO2 100%   BMI 25.00 kg/m    Wt Readings from Last 3 Encounters:  06/14/22 164 lb 6.4 oz (74.6 kg)  04/21/22 164 lb 6.4 oz (74.6 kg)  04/05/22 162 lb 6.4 oz (73.7 kg)    Physical Exam Vitals and nursing note reviewed.  Constitutional:      General: He is not in acute distress.    Appearance: Normal appearance. He is well-developed. He is not diaphoretic.     Comments: Well-appearing, comfortable, cooperative  HENT:     Head: Normocephalic and atraumatic.  Eyes:     General:        Right eye: No discharge.        Left eye: No discharge.     Conjunctiva/sclera: Conjunctivae normal.  Cardiovascular:     Rate and Rhythm: Normal rate.  Pulmonary:     Effort: Pulmonary effort is normal.  Skin:    General: Skin is warm and dry.     Findings: No erythema or rash.  Neurological:     Mental Status: He is alert and oriented to person, place, and time.  Psychiatric:        Mood and Affect: Mood normal.  Behavior: Behavior normal.        Thought Content: Thought content normal.     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 For max dose of  1000mg . Caution with CHF will limit dosing.  If not quite optimal result in future we can consider the newer medications.  Jardiance (preferred) or (not preferred)- recommended by heart doctors for both sugar and heart, it helps you reduce fluid  He prefers to hold off for now, I expressed my recommendation similar to that of Cardiology that recommended this option  Other meds below consider   Ozempic (Semaglutide injection)   Trulicity (Dulaglutide)  Mounjaro weekly injection  Rybelsus (pill - oral semaglutide or ozempic) - once daily, taken first thing in morning without other meds   Meds ordered this encounter  Medications   metFORMIN (GLUCOPHAGE-XR) 500 MG 24 hr tablet    Sig: Take 2 tablets (1,000 mg total) by mouth daily with breakfast.    Dispense:  180 tablet    Refill:  3     Follow up plan: Return in about 4 months (around 10/14/2022) for 4 month fasting lab only then 1 week later Annual Physical.  Future labs 10/11/22  10/16/2022, DO [facility] 06/14/2022, 10:53 AM

## 2022-06-30 ENCOUNTER — Other Ambulatory Visit: Payer: Self-pay | Admitting: Family Medicine

## 2022-06-30 ENCOUNTER — Other Ambulatory Visit: Payer: Self-pay | Admitting: Cardiovascular Disease

## 2022-06-30 DIAGNOSIS — E1136 Type 2 diabetes mellitus with diabetic cataract: Secondary | ICD-10-CM

## 2022-06-30 NOTE — Telephone Encounter (Signed)
Requested Prescriptions  Pending Prescriptions Disp Refills  . pravastatin (PRAVACHOL) 10 MG tablet [Pharmacy Med Name: PRAVASTATIN SODIUM 10 MG Tablet] 90 tablet 3    Sig: TAKE 1 TABLET AT BEDTIME     Cardiovascular:  Antilipid - Statins Failed - 06/30/2022 12:28 PM      Failed - Lipid Panel in normal range within the last 12 months    Cholesterol, Total  Date Value Ref Range Status  04/13/2017 152  Final   Cholesterol  Date Value Ref Range Status  11/15/2021 154 <200 mg/dL Final   LDL Cholesterol (Calc)  Date Value Ref Range Status  11/15/2021 68 mg/dL (calc) Final    Comment:    Reference range: <100 . Desirable range <100 mg/dL for primary prevention;   <70 mg/dL for patients with CHD or diabetic patients  with > or = 2 CHD risk factors. Marland Kitchen LDL-C is now calculated using the Martin-Hopkins  calculation, which is a validated novel method providing  better accuracy than the Friedewald equation in the  estimation of LDL-C.  Horald Pollen et al. Lenox Ahr. 6803;212(24): 2061-2068  (http://education.[facility].com/faq/FAQ164)    HDL Cholesterol  Date Value Ref Range Status  04/13/2017 64  Final   HDL  Date Value Ref Range Status  11/15/2021 70 > OR = 40 mg/dL Final   Triglycerides  Date Value Ref Range Status  11/15/2021 81 <150 mg/dL Final  82/50/0370 56  Final         Passed - Patient is not pregnant      Passed - Valid encounter within last 12 months    Recent Outpatient Visits          2 weeks ago 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 [facility])   [facility], Netta Neat, DO   7 months ago 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 [facility])   [facility] Smitty Cords, DO   10 months ago Annual physical exam   [facility] Smitty Cords, DO   1 year ago 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 [facility])   [facility] Smitty Cords, DO    1 year ago Annual physical exam   [facility] Smitty Cords, DO      Future Appointments            In 1 month Gollan, Tollie Pizza, MD [facility], [facility]   In 3 months Althea Charon, Netta Neat, DO [facility], [facility]

## 2022-07-03 ENCOUNTER — Ambulatory Visit (INDEPENDENT_AMBULATORY_CARE_PROVIDER_SITE_OTHER): Payer: Medicare HMO

## 2022-07-03 DIAGNOSIS — I5022 Chronic systolic (congestive) heart failure: Secondary | ICD-10-CM | POA: Diagnosis not present

## 2022-07-03 DIAGNOSIS — Z95810 Presence of automatic (implantable) cardiac defibrillator: Secondary | ICD-10-CM

## 2022-07-05 ENCOUNTER — Telehealth: Payer: Self-pay

## 2022-07-05 NOTE — Progress Notes (Signed)
EPIC Encounter for ICM Monitoring  Patient Name: Russell [name]. is a 71 y.o. male Date: 07/05/2022 Primary Care Physican: Smitty Cords, DO Primary Cardiologist: Mariah Milling Electrophysiologist: Allred 05/31/2022 Weight: 164 lbs                                                          Attempted call to patient and unable to reach.  Transmission reviewed.    Optivol thoracic impedance suggesting normal fluid levels.    Prescribed: Spironolactone 25 mg take 1 tablet daily   Labs: 04/05/2022 Creatinine 1.23, BUN 13, Potassium 4.6, Sodium 139, GFR 64 A complete set of results can be found in Results Review.   Recommendations:  Unable to reach.     Follow-up plan: ICM clinic phone appointment on 08/08/2022.   91 day device clinic remote transmission 08/07/2022.    EP/Cardiology Next Office Visit:  08/08/2022 with Dr Mariah Milling.  Recall 10/02/2022 with Dr Lalla Brothers.     Copy of ICM check sent to Dr. Johney Frame.  3 month ICM trend: 07/03/2022.    12-14 Month ICM trend:     [name], RN 07/05/2022 3:51 PM

## 2022-07-05 NOTE — Telephone Encounter (Signed)
Remote ICM transmission received.  Attempted call to patient regarding ICM remote transmission and no answer or voice mail option.  

## 2022-07-14 ENCOUNTER — Other Ambulatory Visit: Payer: Self-pay | Admitting: *Deleted

## 2022-07-14 MED ORDER — CARVEDILOL 25 MG PO TABS: 25.0000 mg | ORAL_TABLET | Freq: Two times a day (BID) | ORAL | 3 refills | Status: DC

## 2022-08-07 ENCOUNTER — Ambulatory Visit: Payer: Medicare HMO

## 2022-08-07 ENCOUNTER — Telehealth: Payer: Self-pay | Admitting: Cardiovascular Disease

## 2022-08-07 NOTE — Telephone Encounter (Signed)
Called patient to reschedule appointment No VM

## 2022-08-08 ENCOUNTER — Ambulatory Visit: Payer: Medicare HMO | Admitting: Cardiovascular Disease

## 2022-08-08 ENCOUNTER — Ambulatory Visit (INDEPENDENT_AMBULATORY_CARE_PROVIDER_SITE_OTHER): Payer: Medicare HMO

## 2022-08-08 DIAGNOSIS — I5022 Chronic systolic (congestive) heart failure: Secondary | ICD-10-CM | POA: Diagnosis not present

## 2022-08-08 DIAGNOSIS — Z95810 Presence of automatic (implantable) cardiac defibrillator: Secondary | ICD-10-CM

## 2022-08-08 LAB — CUP PACEART REMOTE DEVICE CHECK
Battery Remaining Longevity: 110 mo
Battery Voltage: 3.01 V
Brady Statistic RV Percent Paced: 0.09 %
Date Time Interrogation Session: 20230814012503
HighPow Impedance: 58 Ohm
Implantable Lead Implant Date: 20200514
Implantable Lead Location: 753860
Implantable Lead Model: 6935
Implantable Pulse Generator Implant Date: 20200514
Lead Channel Impedance Value: 342 Ohm
Lead Channel Impedance Value: 437 Ohm
Lead Channel Pacing Threshold Amplitude: 0.75 V
Lead Channel Pacing Threshold Pulse Width: 0.4 ms
Lead Channel Sensing Intrinsic Amplitude: 8.25 mV
Lead Channel Sensing Intrinsic Amplitude: 8.25 mV
Lead Channel Setting Pacing Amplitude: 2 V
Lead Channel Setting Pacing Pulse Width: 0.4 ms
Lead Channel Setting Sensing Sensitivity: 0.3 mV

## 2022-08-08 NOTE — Progress Notes (Unsigned)
EPIC Encounter for ICM Monitoring  Patient Name: Russell [name]. is a 71 y.o. male Date: 08/08/2022 Primary Care Physican: Smitty Cords, DO Primary Cardiologist: Mariah Milling Electrophysiologist: Allred 08/10/2022 Weight: 164 lbs                                                          Spoke with patient and heart failure questions reviewed.  Pt asymptomatic for fluid accumulation.  Reports feeling well at this time and voices no complaints.    Optivol thoracic impedance suggesting normal fluid levels.    Prescribed: Spironolactone 25 mg take 1 tablet daily   Labs: 04/05/2022 Creatinine 1.23, BUN 13, Potassium 4.6, Sodium 139, GFR 64 A complete set of results can be found in Results Review.   Recommendations:  No changes and encouraged to call if experiencing any fluid symptoms.   Follow-up plan: ICM clinic phone appointment on 09/11/2022.   91 day device clinic remote transmission 11/06/2022.    EP/Cardiology Next Office Visit:  10/10/2022 with Dr Mariah Milling.  10/04/2022 with Dr Lalla Brothers.     Copy of ICM check sent to Dr. Johney Frame.  3 month ICM trend: 08/08/2022.    12-14 Month ICM trend:     [name], RN 08/08/2022 8:09 AM

## 2022-08-24 DIAGNOSIS — H401130 Primary open-angle glaucoma, bilateral, stage unspecified: Secondary | ICD-10-CM | POA: Diagnosis not present

## 2022-08-24 DIAGNOSIS — H402213 Chronic angle-closure glaucoma, right eye, severe stage: Secondary | ICD-10-CM | POA: Diagnosis not present

## 2022-08-24 DIAGNOSIS — H524 Presbyopia: Secondary | ICD-10-CM | POA: Diagnosis not present

## 2022-08-24 LAB — HM DIABETES EYE EXAM

## 2022-09-11 ENCOUNTER — Ambulatory Visit (INDEPENDENT_AMBULATORY_CARE_PROVIDER_SITE_OTHER): Payer: Medicare HMO

## 2022-09-11 DIAGNOSIS — I5022 Chronic systolic (congestive) heart failure: Secondary | ICD-10-CM

## 2022-09-11 DIAGNOSIS — Z95810 Presence of automatic (implantable) cardiac defibrillator: Secondary | ICD-10-CM | POA: Diagnosis not present

## 2022-09-15 NOTE — Progress Notes (Signed)
EPIC Encounter for ICM Monitoring  Patient Name: Russell Jeff. is a 71 y.o. male Date: 09/15/2022 Primary Care Physican: Olin Hauser, DO Primary Cardiologist: Rockey Situ Electrophysiologist: Quentin Ore 08/10/2022 Weight: 164 lbs 09/15/2022 Weight: 164-165 lbs                                                          Spoke with patient and heart failure questions reviewed.  Pt asymptomatic for fluid accumulation.  Reports feeling well at this time and voices no complaints.    Optivol thoracic impedance suggesting normal fluid levels.    Prescribed: Spironolactone 25 mg take 1 tablet daily   Labs: 04/05/2022 Creatinine 1.23, BUN 13, Potassium 4.6, Sodium 139, GFR 64 A complete set of results can be found in Results Review.   Recommendations:  No changes and encouraged to call if experiencing any fluid symptoms.   Follow-up plan: ICM clinic phone appointment on 10/16/2022.   91 day device clinic remote transmission 11/06/2022.    EP/Cardiology Next Office Visit:  10/10/2022 with Dr Rockey Situ.  10/04/2022 with Dr Quentin Ore.     Copy of ICM check sent to Dr. Quentin Ore.  3 month ICM trend: 09/11/2022.    12-14 Month ICM trend:     Rosalene Billings, RN 09/15/2022 2:18 PM

## 2022-10-03 NOTE — Progress Notes (Unsigned)
Electrophysiology Office Follow up Visit Note:    Date:  10/04/2022   ID:  Russell Love., DOB 1951/01/13, MRN [id number]  PCP:  Olin Hauser, DO  [facility] Cardiologist:  None  [facility] Electrophysiologist:  Vickie Epley, MD    Interval History:    Russell Wohl. is a 71 y.o. male who presents for a follow up visit. They were last seen in clinic by Oda Kilts 04/05/2022. The patient was previously followed by Dr Rayann Heman. He has a history of ICD implanted 04/2019 for HFrEF and VT. He also has a history of DM, PVC, nonobstructive CAD, HTN, VT, cardiac arrest.  He takes amiodarone 140m PO daily.   Today he is doing very well.  No complaints.  No problems with his medications.  No problems with his defibrillator.       Past Medical History:  Diagnosis Date   CAD (coronary artery disease)    05/07/2019 LHC showed nonobstructive CAD   Diabetes ([place]    Frequent PVCs    Heart [place] with reduced ejection fraction ([place]    04/2019 EF 20-25%   Hypertension    Impotence of organic origin    NICM (nonischemic cardiomyopathy) ([place]    HFrEF in setting of NICM   Pain in joint, shoulder region    Personal history of colonic polyps    Ventricular tachycardia ([place]    04/2019 in setting of cardiac arrest    Past Surgical History:  Procedure Laterality Date   CARDIAC CATHETERIZATION  11/17/2013   COLONOSCOPY  2009   ICD IMPLANT N/A 05/08/2019   Procedure: ICD IMPLANT;  Surgeon: AThompson Grayer MD;  [facility];  Service: Cardiovascular;  Laterality: N/A;   RIGHT/LEFT HEART CATH AND CORONARY ANGIOGRAPHY N/A 05/07/2019   Procedure: RIGHT/LEFT HEART CATH AND CORONARY ANGIOGRAPHY;  Surgeon: ENelva Bush MD;  [facility];  Service: Cardiovascular;  Laterality: N/A;    Current Medications: Current Meds  Medication Sig   amiodarone (PACERONE) 200 MG tablet TAKE 1/2 TABLET EVERY DAY   Blood Glucose Monitoring Suppl (ONE  TOUCH ULTRA 2) w/Device KIT Use to check blood sugar x 1 daily   brimonidine (ALPHAGAN) 0.2 % ophthalmic solution Place 1 drop into both eyes 2 (two) times daily.   carvedilol (COREG) 25 MG tablet Take 1 tablet (25 mg total) by mouth 2 (two) times daily.   Cholecalciferol (VITAMIN D3) 2000 units TABS Take 1 capsule by mouth daily.    dorzolamide-timolol (COSOPT) 22.3-6.8 MG/ML ophthalmic solution Place 1 drop into both eyes 2 (two) times daily.   doxazosin (CARDURA) 1 MG tablet Take 1 tablet (1 mg total) by mouth 2 (two) times daily.   finasteride (PROSCAR) 5 MG tablet Take 5 mg by mouth daily.   isosorbide mononitrate (IMDUR) 60 MG 24 hr tablet Take 1.5 tablets (90 mg total) by mouth 2 (two) times daily.   latanoprost (XALATAN) 0.005 % ophthalmic solution Place 1 drop into both eyes at bedtime.    metFORMIN (GLUCOPHAGE-XR) 500 MG 24 hr tablet Take 2 tablets (1,000 mg total) by mouth daily with breakfast.   nitroGLYCERIN (NITROSTAT) 0.4 MG SL tablet DISSOLVE ONE TABLET UNDER THE TONGUE EVERY 5 MINUTES AS NEEDED FOR CHEST PAIN.  DO NOT EXCEED A TOTAL OF 3 DOSES IN 15 MINUTES   OneTouch Delica Lancets 371IMISC Use to check blood sugar up to 1 time daily   ONETOUCH ULTRA test strip Check blood sugar 1 x daily  polyvinyl alcohol (LIQUIFILM TEARS) 1.4 % ophthalmic solution Place 2 drops into both eyes every 8 (eight) hours.   pravastatin (PRAVACHOL) 10 MG tablet TAKE 1 TABLET AT BEDTIME   sacubitril-valsartan (ENTRESTO) 97-103 MG Take 1 tablet by mouth 2 (two) times daily.   tamsulosin (FLOMAX) 0.4 MG CAPS capsule Take 0.4 mg by mouth daily.   timolol (TIMOPTIC) 0.5 % ophthalmic solution Place 1 drop into both eyes 2 (two) times daily.     Allergies:   Patient has no known allergies.   Social History   Socioeconomic History   Marital status: Married    Spouse name: Not on file   Number of children: Not on file   Years of education: Not on file   Highest education level: Not on file   Occupational History   Occupation: [employer]   Occupation: retired  Tobacco Use   Smoking status: Never   Smokeless tobacco: Never  Vaping Use   Vaping Use: Never used  Substance and Sexual Activity   Alcohol use: Yes    Comment: occassional   Drug use: No   Sexual activity: Not on file  Other Topics Concern   Not on file  Social History Narrative   Not on file   Social Determinants of Health   Financial Resource Strain: Low Risk  (04/21/2022)   Overall Financial Resource Strain (CARDIA)    Difficulty of Paying Living Expenses: Not hard at all  Food Insecurity: No Food Insecurity (04/21/2022)   Hunger Vital Sign    Worried About Running Out of Food in the Last Year: Never true    [place] in the Last Year: Never true  Transportation Needs: No Transportation Needs (04/21/2022)   PRAPARE - Hydrologist (Medical): No    Lack of Transportation (Non-Medical): No  Physical Activity: Insufficiently Active (04/21/2022)   Exercise Vital Sign    Days of Exercise per Week: 3 days    Minutes of Exercise per Session: 40 min  Stress: No Stress Concern Present (04/21/2022)   [place]    Feeling of Stress : Not at all  Social Connections: Moderately Integrated (04/21/2022)   Social Connection and Isolation Panel [NHANES]    Frequency of Communication with Friends and Family: More than three times a week    Frequency of Social Gatherings with Friends and Family: Twice a week    Attends Religious Services: More than 4 times per year    Active Member of [employer] or Organizations: No    Attends Archivist Meetings: Never    Marital Status: Married     Family History: The patient's family history includes Colon cancer in his father; Diabetes in his mother; Hypertension in his mother.  ROS:   Please see the history of present illness.    All other systems reviewed and are  negative.  EKGs/Labs/Other Studies Reviewed:    The following studies were reviewed today:  10/04/2022 in clinic device interrogation personally reviewed Battery longevity 9 years No high-voltage therapies Lead parameters stable 1 very brief nonsustained VT lasting 1 second Less than 0.1% ventricular pacing OptiVol okay  Recent Labs: 04/05/2022: ALT 14; BUN 13; Creatinine, Ser 1.23; Potassium 4.6; Sodium 139; TSH 0.578  Recent Lipid Panel    Component Value Date/Time   CHOL 154 11/15/2021 0801   CHOL 152 04/13/2017 0920   TRIG 81 11/15/2021 0801   TRIG 56 04/13/2017 0920  HDL 70 11/15/2021 0801   HDL 64 04/13/2017 0920   CHOLHDL 2.2 11/15/2021 0801   LDLCALC 68 11/15/2021 0801    Physical Exam:    VS:  BP 138/88   Pulse 68   Ht 5' 8"  (1.727 m)   Wt 164 lb (74.4 kg)   SpO2 97%   BMI 24.94 kg/m     Wt Readings from Last 3 Encounters:  10/04/22 164 lb (74.4 kg)  06/14/22 164 lb 6.4 oz (74.6 kg)  04/21/22 164 lb 6.4 oz (74.6 kg)     GEN:  Well nourished, well developed in no acute distress HEENT: Normal NECK: No JVD; No carotid bruits LYMPHATICS: No lymphadenopathy CARDIAC: RRR, no murmurs, rubs, gallops.  ICD pocket well-healed RESPIRATORY:  Clear to auscultation without rales, wheezing or rhonchi  ABDOMEN: Soft, non-tender, non-distended MUSCULOSKELETAL:  No edema; No deformity  SKIN: Warm and dry NEUROLOGIC:  Alert and oriented x 3 PSYCHIATRIC:  Normal affect        ASSESSMENT:    1. Chronic systolic heart failure ([place])   2. ICD (implantable cardioverter-defibrillator) in place   3. Primary hypertension   4. Encounter for long-term (current) use of high-risk medication    PLAN:    In order of problems listed above:  #Chronic systolic heart failure NYHA class I-II.  Warm and dry on exam today.  OptiVol okay. Continue current medications including Coreg, Entresto, spironolactone.  #ICD in situ Device functioning appropriately.  Continue remote  monitoring.  #Amiodarone monitoring Update CMP, TSH and free T4 today.    Follow-up 6 months with APP for amiodarone monitoring.   Medication Adjustments/Labs and Tests Ordered: Current medicines are reviewed at length with the patient today.  Concerns regarding medicines are outlined above.  Orders Placed This Encounter  Procedures   Comp Met (CMET)   TSH   T4, free   No orders of the defined types were placed in this encounter.    Signed, Russell Mage, MD, [facility], [facility] 10/04/2022 10:35 AM    Electrophysiology [facility]

## 2022-10-04 ENCOUNTER — Ambulatory Visit: Payer: Medicare HMO | Attending: Cardiology | Admitting: Cardiology

## 2022-10-04 ENCOUNTER — Encounter: Payer: Self-pay | Admitting: Cardiology

## 2022-10-04 ENCOUNTER — Other Ambulatory Visit
Admission: RE | Admit: 2022-10-04 | Discharge: 2022-10-04 | Disposition: A | Discharge status: Home / Self Care | Payer: Medicare HMO | Source: Ambulatory Visit | Attending: Cardiology | Admitting: Cardiology

## 2022-10-04 VITALS — BP 138/88 | HR 68 | Ht 68.0 in | Wt 164.0 lb

## 2022-10-04 DIAGNOSIS — Z95810 Presence of automatic (implantable) cardiac defibrillator: Secondary | ICD-10-CM

## 2022-10-04 DIAGNOSIS — Z79899 Other long term (current) drug therapy: Secondary | ICD-10-CM | POA: Diagnosis not present

## 2022-10-04 DIAGNOSIS — I10 Essential (primary) hypertension: Secondary | ICD-10-CM

## 2022-10-04 DIAGNOSIS — I5022 Chronic systolic (congestive) heart failure: Secondary | ICD-10-CM | POA: Diagnosis not present

## 2022-10-04 LAB — CUP PACEART INCLINIC DEVICE CHECK
Battery Remaining Longevity: 107 mo
Battery Voltage: 3 V
Brady Statistic RV Percent Paced: 0.09 %
Date Time Interrogation Session: 20231011103826
HighPow Impedance: 53 Ohm
Implantable Lead Implant Date: 20200514
Implantable Lead Location: 753860
Implantable Lead Model: 6935
Implantable Pulse Generator Implant Date: 20200514
Lead Channel Impedance Value: 323 Ohm
Lead Channel Impedance Value: 380 Ohm
Lead Channel Pacing Threshold Amplitude: 0.625 V
Lead Channel Pacing Threshold Pulse Width: 0.4 ms
Lead Channel Sensing Intrinsic Amplitude: 11.75 mV
Lead Channel Sensing Intrinsic Amplitude: 8.75 mV
Lead Channel Setting Pacing Amplitude: 2 V
Lead Channel Setting Pacing Pulse Width: 0.4 ms
Lead Channel Setting Sensing Sensitivity: 0.3 mV

## 2022-10-04 LAB — COMPREHENSIVE METABOLIC PANEL
ALT: 16 U/L (ref 0–44)
AST: 14 U/L — ABNORMAL LOW (ref 15–41)
Albumin: 3.9 g/dL (ref 3.5–5.0)
Alkaline Phosphatase: 41 U/L (ref 38–126)
Anion gap: 7 (ref 5–15)
BUN: 15 mg/dL (ref 8–23)
CO2: 26 mmol/L (ref 22–32)
Calcium: 9 mg/dL (ref 8.9–10.3)
Chloride: 108 mmol/L (ref 98–111)
Creatinine, Ser: 1.15 mg/dL (ref 0.61–1.24)
GFR, Estimated: >60 mL/min (ref >60)
Glucose, Bld: 185 mg/dL — ABNORMAL HIGH (ref 70–99)
Potassium: 4.3 mmol/L (ref 3.5–5.1)
Sodium: 141 mmol/L (ref 135–145)
Total Bilirubin: 1 mg/dL (ref 0.3–1.2)
Total Protein: 6.9 g/dL (ref 6.5–8.1)

## 2022-10-04 LAB — TSH: TSH: 0.991 u[IU]/mL (ref 0.350–4.500)

## 2022-10-04 LAB — T4, FREE: Free T4: 1.21 ng/dL — ABNORMAL HIGH (ref 0.61–1.12)

## 2022-10-04 NOTE — Patient Instructions (Signed)
Medication Instructions:  none *If you need a refill on your cardiac medications before your next appointment, please call your pharmacy*   Lab Work: CMP,TSH,FreeT4 If you have labs (blood work) drawn today and your tests are completely normal, you will receive your results only by: [place] (if you have MyChart) OR A paper copy in the mail If you have any lab test that is abnormal or we need to change your treatment, we will call you to review the results.   Testing/Procedures: None    Follow-Up: At [facility], you and your health needs are our priority.  As part of our continuing mission to provide you with exceptional heart care, we have created designated Provider Care Teams.  These Care Teams include your primary Cardiologist (physician) and Advanced Practice Providers (APPs -  Physician Assistants and Nurse Practitioners) who all work together to provide you with the care you need, when you need it.  We recommend signing up for the patient portal called "MyChart".  Sign up information is provided on this After Visit Summary.  MyChart is used to connect with patients for Virtual Visits (Telemedicine).  Patients are able to view lab/test results, encounter notes, upcoming appointments, etc.  Non-urgent messages can be sent to your provider as well.   To learn more about what you can do with MyChart, go to [email or web address].    Your next appointment:   6 month(s)  The format for your next appointment:   In Person  Provider:   You will see one of the following Advanced Practice Providers on your designated Care Team:   Murray Hodgkins, NP Christell Faith, PA-C Cadence Kathlen Mody, PA-C Gerrie Nordmann, NP      Other Instructions None   Important Information About Sugar

## 2022-10-05 ENCOUNTER — Ambulatory Visit
Admission: EM | Admit: 2022-10-05 | Discharge: 2022-10-05 | Disposition: A | Discharge status: Home / Self Care | Payer: Medicare HMO | Attending: Emergency Medicine | Admitting: Emergency Medicine

## 2022-10-05 ENCOUNTER — Other Ambulatory Visit: Payer: Self-pay

## 2022-10-05 DIAGNOSIS — U071 COVID-19: Secondary | ICD-10-CM | POA: Diagnosis not present

## 2022-10-05 LAB — SARS CORONAVIRUS 2 BY RT PCR: SARS Coronavirus 2 by RT PCR: POSITIVE — AB

## 2022-10-05 MED ORDER — MOLNUPIRAVIR EUA 200MG CAPSULE: 4.0000 | ORAL_CAPSULE | Freq: Two times a day (BID) | ORAL | 0 refills | Status: AC

## 2022-10-05 MED ORDER — IPRATROPIUM BROMIDE 0.06 % NA SOLN: 2.0000 | Freq: Four times a day (QID) | NASAL | 12 refills | Status: DC

## 2022-10-05 MED ORDER — BENZONATATE 100 MG PO CAPS: 200.0000 mg | ORAL_CAPSULE | Freq: Three times a day (TID) | ORAL | 0 refills | Status: DC

## 2022-10-05 MED ORDER — PROMETHAZINE-DM 6.25-15 MG/5ML PO SYRP: 5.0000 mL | ORAL_SOLUTION | Freq: Four times a day (QID) | ORAL | 0 refills | Status: DC | PRN

## 2022-10-05 NOTE — ED Triage Notes (Signed)
Pt reports his wife tested positive at home for COVID. He is here due to exposure to her, although he is not having sx.

## 2022-10-05 NOTE — ED Provider Notes (Signed)
[facility]    CSN: [id number] Arrival date & time: 10/05/22  1310      History   Chief Complaint Chief Complaint  Patient presents with   Covid Exposure    HPI Russell [name]. is a 71 y.o. male.   HPI  71 year old male here for evaluation following COVID exposure.  Patient reports that his wife tested positive for COVID this morning and he is here to get a COVID test.  He denies any fever, nasal congestion, runny nose, sore throat, cough, shortness of breath, wheezing, GI complaints, body aches, or headache.  Past Medical History:  Diagnosis Date   CAD (coronary artery disease)    05/07/2019 LHC showed nonobstructive CAD   Diabetes ([place])    Frequent PVCs    Heart failure with reduced ejection fraction ([place])    04/2019 EF 20-25%   Hypertension    Impotence of organic origin    NICM (nonischemic cardiomyopathy) (HCC)    HFrEF in setting of NICM   Pain in joint, shoulder region    Personal history of colonic polyps    Ventricular tachycardia ([place])    04/2019 in setting of cardiac arrest    Patient Active Problem List   Diagnosis Date Noted   Benign prostatic hyperplasia with urinary hesitancy 08/22/2019   ICD (implantable cardioverter-defibrillator) in place 05/10/2019   Nonischemic cardiomyopathy ([place]) 65/02/5464   Chronic systolic heart failure (HCC)    CAD (coronary artery disease) 08/22/2017   Vitamin D deficiency 04/17/2017   Phobia 03/30/2016   Elevated PSA 02/17/2016   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 ([place]) 11/12/2015   Erectile dysfunction 07/20/2014   Congestive dilated cardiomyopathy ([place]) 11/06/2013   Essential hypertension 10/23/2013   Abnormal EKG 10/23/2013   Encounter for screening colonoscopy 08/04/2013    Past Surgical History:  Procedure Laterality Date   CARDIAC CATHETERIZATION  11/17/2013   COLONOSCOPY  2009   ICD IMPLANT N/A 05/08/2019   Procedure: ICD IMPLANT;  Surgeon: Thompson Grayer, MD;  Location: [place] [facility];  Service: Cardiovascular;  Laterality: N/A;   RIGHT/LEFT HEART CATH AND CORONARY ANGIOGRAPHY N/A 05/07/2019   Procedure: RIGHT/LEFT HEART CATH AND CORONARY ANGIOGRAPHY;  Surgeon: Nelva Bush, MD;  Location: [place] [facility];  Service: Cardiovascular;  Laterality: N/A;       Home Medications    Prior to Admission medications   Medication Sig Start Date End Date Taking? Authorizing Provider  benzonatate (TESSALON) 100 MG capsule Take 2 capsules (200 mg total) by mouth every 8 (eight) hours. 10/05/22  Yes Margarette Canada, NP  ipratropium (ATROVENT) 0.06 % nasal spray Place 2 sprays into both nostrils 4 (four) times daily. 10/05/22  Yes Margarette Canada, NP  molnupiravir EUA (LAGEVRIO) 200 mg CAPS capsule Take 4 capsules (800 mg total) by mouth 2 (two) times daily for 5 days. 10/05/22 10/10/22 Yes Margarette Canada, NP  promethazine-dextromethorphan (PROMETHAZINE-DM) 6.25-15 MG/5ML syrup Take 5 mLs by mouth 4 (four) times daily as needed. 10/05/22  Yes Margarette Canada, NP  amiodarone (PACERONE) 200 MG tablet TAKE 1/2 TABLET EVERY DAY 04/06/22   Allred, Jeneen Rinks, MD  Blood Glucose Monitoring Suppl (ONE TOUCH ULTRA 2) w/Device KIT Use to check blood sugar x 1 daily 11/28/21   Karamalegos, Devonne Doughty, DO  brimonidine (ALPHAGAN) 0.2 % ophthalmic solution Place 1 drop into both eyes 2 (two) times daily. 03/05/19   [provider]  carvedilol (COREG) 25 MG tablet Take 1 tablet (25 mg total) by mouth 2 (  two) times daily. 07/14/22   Gollan, Timothy J, MD  Cholecalciferol (VITAMIN D3) 2000 units TABS Take 1 capsule by mouth daily.     [provider]  dorzolamide-timolol (COSOPT) 22.3-6.8 MG/ML ophthalmic solution Place 1 drop into both eyes 2 (two) times daily. 05/26/21   [provider]  doxazosin (CARDURA) 1 MG tablet Take 1 tablet (1 mg total) by mouth 2 (two) times daily. 01/30/22   Gollan, Timothy J, MD  finasteride (PROSCAR) 5 MG tablet Take 5 mg by mouth daily. 05/23/22    [provider]  isosorbide mononitrate (IMDUR) 60 MG 24 hr tablet Take 1.5 tablets (90 mg total) by mouth 2 (two) times daily. 01/30/22   Gollan, Timothy J, MD  latanoprost (XALATAN) 0.005 % ophthalmic solution Place 1 drop into both eyes at bedtime.  05/30/18   [provider]  metFORMIN (GLUCOPHAGE-XR) 500 MG 24 hr tablet Take 2 tablets (1,000 mg total) by mouth daily with breakfast. 06/14/22   Karamalegos, Alexander J, DO  nitroGLYCERIN (NITROSTAT) 0.4 MG SL tablet DISSOLVE ONE TABLET UNDER THE TONGUE EVERY 5 MINUTES AS NEEDED FOR CHEST PAIN.  DO NOT EXCEED A TOTAL OF 3 DOSES IN 15 MINUTES 04/22/21   Gollan, Timothy J, MD  OneTouch Delica Lancets 33G MISC Use to check blood sugar up to 1 time daily 01/20/22   Karamalegos, Alexander J, DO  ONETOUCH ULTRA test strip Check blood sugar 1 x daily 11/28/21   Karamalegos, Alexander J, DO  polyvinyl alcohol (LIQUIFILM TEARS) 1.4 % ophthalmic solution Place 2 drops into both eyes every 8 (eight) hours. 05/07/19   Wieting, Richard, MD  pravastatin (PRAVACHOL) 10 MG tablet TAKE 1 TABLET AT BEDTIME 06/30/22   Karamalegos, Alexander J, DO  sacubitril-valsartan (ENTRESTO) 97-103 MG Take 1 tablet by mouth 2 (two) times daily. 12/19/21   Berge, Christopher Ronald, NP  spironolactone (ALDACTONE) 25 MG tablet Take 1 tablet (25 mg total) by mouth daily. 07/25/21 01/30/22  Gollan, Timothy J, MD  tamsulosin (FLOMAX) 0.4 MG CAPS capsule Take 0.4 mg by mouth daily. 12/06/20   [provider]  timolol (TIMOPTIC) 0.5 % ophthalmic solution Place 1 drop into both eyes 2 (two) times daily. 06/18/18   [provider]    Family History Family History  Problem Relation Age of Onset   Colon cancer Father        1970's   Hypertension Mother    Diabetes Mother     Social History Social History   Tobacco Use   Smoking status: Never   Smokeless tobacco: Never  Vaping Use   Vaping Use: Never used  Substance Use Topics   Alcohol use: Yes     Comment: occassional   Drug use: No     Allergies   Patient has no known allergies.   Review of Systems Review of Systems  Constitutional:  Negative for fever.  HENT:  Negative for congestion, ear pain, rhinorrhea and sore throat.   Respiratory:  Negative for cough, shortness of breath and wheezing.   Gastrointestinal:  Negative for diarrhea, nausea and vomiting.  Musculoskeletal:  Negative for arthralgias and myalgias.  Skin:  Negative for rash.  Neurological:  Negative for headaches.  Hematological: Negative.   Psychiatric/Behavioral: Negative.       Physical Exam Triage Vital Signs ED Triage Vitals  Enc Vitals Group     BP 10/05/22 1330 (!) 166/98     Pulse Rate 10/05/22 1330 65     Resp 10/05/22 1330 16       Temp 10/05/22 1330 98.2 F (36.8 C)     Temp Source 10/05/22 1330 Oral     SpO2 10/05/22 1330 99 %     Weight 10/05/22 1332 164 lb (74.4 kg)     Height 10/05/22 1332 5' 8" (1.727 m)     Head Circumference --      Peak Flow --      Pain Score 10/05/22 1332 0     Pain Loc --      Pain Edu? --      Excl. in GC? --    No data found.  Updated Vital Signs BP (!) 166/98 (BP Location: Left Arm)   Pulse 65   Temp 98.2 F (36.8 C) (Oral)   Resp 16   Ht 5' 8" (1.727 m)   Wt 164 lb (74.4 kg)   SpO2 99%   BMI 24.94 kg/m   Visual Acuity Right Eye Distance:   Left Eye Distance:   Bilateral Distance:    Right Eye Near:   Left Eye Near:    Bilateral Near:     Physical Exam Vitals reviewed.  Constitutional:      Appearance: Normal appearance. He is not ill-appearing.  HENT:     Head: Normocephalic and atraumatic.     Right Ear: Tympanic membrane, ear canal and external ear normal. There is no impacted cerumen.     Left Ear: Tympanic membrane, ear canal and external ear normal. There is no impacted cerumen.     Nose: Nose normal. No congestion or rhinorrhea.     Mouth/Throat:     Mouth: Mucous membranes are moist.     Pharynx: Oropharynx is clear. No  oropharyngeal exudate or posterior oropharyngeal erythema.  Cardiovascular:     Rate and Rhythm: Normal rate and regular rhythm.     Pulses: Normal pulses.     Heart sounds: Normal heart sounds. No murmur heard.    No friction rub. No gallop.  Pulmonary:     Effort: Pulmonary effort is normal.     Breath sounds: Normal breath sounds. No wheezing, rhonchi or rales.  Musculoskeletal:     Cervical back: Normal range of motion and neck supple.  Lymphadenopathy:     Cervical: No cervical adenopathy.  Skin:    General: Skin is warm and dry.     Capillary Refill: Capillary refill takes less than 2 seconds.     Findings: No erythema.  Neurological:     General: No focal deficit present.     Mental Status: He is alert and oriented to person, place, and time.  Psychiatric:        Mood and Affect: Mood normal.        Behavior: Behavior normal.        Thought Content: Thought content normal.        Judgment: Judgment normal.      UC Treatments / Results  Labs (all labs ordered are listed, but only abnormal results are displayed) Labs Reviewed  SARS CORONAVIRUS 2 BY RT PCR - Abnormal; Notable for the following components:      Result Value   SARS Coronavirus 2 by RT PCR POSITIVE (*)    All other components within normal limits    EKG   Radiology CUP PACEART INCLINIC DEVICE CHECK  Result Date: 10/04/2022 ICD check in clinic. Normal device function. Thresholds and sensing consistent with previous device measurements. Impedance trends stable over time. One brief ventricular arrhythmia-see attachment. Histogram distribution appropriate for patient   and level  of activity. No changes made this session. Device programmed at appropriate safety margins. Device programmed to optimize intrinsic conduction. Estimated longevity 8.9 years. Pt enrolled in remote follow-up. Patient education completed including shock plan. Auditory/vibratory alert demonstrated.Myrtie Hawk, BSN, RN    Procedures Procedures (including critical care time)  Medications Ordered in UC Medications - No data to display  Initial Impression / Assessment and Plan / UC Course  I have reviewed the triage vital signs and the nursing notes.  Pertinent labs & imaging results that were available during my care of the patient were reviewed by me and considered in my medical decision making (see chart for details).   Patient is a very pleasant, nontoxic-appearing 71 year old male with a past medical history significant for dilated cardiomyopathy, essential hypertension, type 2 diabetes, coronary artery disease, and implanted ICD presenting for COVID testing after his wife tested positive this morning.  He denies any symptoms at all and his physical exam is completely benign.  I did order a COVID PCR which was positive.  I will discharge patient home on molnupiravir twice daily for treatment of COVID-19.  I have also prescribed Atrovent nasal spray, Tessalon Perles, and Promethazine DM cough syrup should he develop any other respiratory symptoms.  I have advised the patient that if he wants to leave in the pharmacy and pick him up if his symptoms develop he can do that or he can pick him up and have them on hand.  I also discussed return and ER precautions with the patient.   Final Clinical Impressions(s) / UC Diagnoses   Final diagnoses:  [id number]     Discharge Instructions      You will have to quarantine for 5 days from the date of your positive test.  After 5 days you can break quarantine if your symptoms have improved and you have not had a fever for 24 hours without taking Tylenol or ibuprofen.  Use over-the-counter Tylenol and ibuprofen as needed for body aches and fever.  Use the Atrovent nasal spray, 2 squirts in each nostril every 6 hours, as needed for runny nose and postnasal drip.  Use the Tessalon Perles every 8 hours during the day.  Take them with a small sip of water.  They may  give you some numbness to the base of your tongue or a metallic taste in your mouth, this is normal.  Use the Promethazine DM cough syrup at bedtime for cough and congestion.  It will make you drowsy so do not take it during the day.  Take the molnupiravir twice daily for 5 days for treatment of COVID-19.  If you develop any increased shortness of breath-especially at rest, you are unable to speak in full sentences, or is a late sign your lips are turning blue you need to go the ER for evaluation.    ED Prescriptions     Medication Sig Dispense Auth. Provider   molnupiravir EUA (LAGEVRIO) 200 mg CAPS capsule Take 4 capsules (800 mg total) by mouth 2 (two) times daily for 5 days. 40 capsule Margarette Canada, NP   benzonatate (TESSALON) 100 MG capsule Take 2 capsules (200 mg total) by mouth every 8 (eight) hours. 21 capsule Margarette Canada, NP   ipratropium (ATROVENT) 0.06 % nasal spray Place 2 sprays into both nostrils 4 (four) times daily. 15 mL Margarette Canada, NP   promethazine-dextromethorphan (PROMETHAZINE-DM) 6.25-15 MG/5ML syrup Take 5 mLs by mouth 4 (four) times daily as needed. 118 mL Thurmond Butts,  Jeremy, NP      PDMP not reviewed this encounter.   Ryan, Jeremy, NP 10/05/22 1436  

## 2022-10-05 NOTE — Discharge Instructions (Addendum)
You will have to quarantine for 5 days from the date of your positive test.  After 5 days you can break quarantine if your symptoms have improved and you have not had a fever for 24 hours without taking Tylenol or ibuprofen.  Use over-the-counter Tylenol and ibuprofen as needed for body aches and fever.  Use the Atrovent nasal spray, 2 squirts in each nostril every 6 hours, as needed for runny nose and postnasal drip.  Use the Tessalon Perles every 8 hours during the day.  Take them with a small sip of water.  They may give you some numbness to the base of your tongue or a metallic taste in your mouth, this is normal.  Use the Promethazine DM cough syrup at bedtime for cough and congestion.  It will make you drowsy so do not take it during the day.  Take the molnupiravir twice daily for 5 days for treatment of COVID-19.  If you develop any increased shortness of breath-especially at rest, you are unable to speak in full sentences, or is a late sign your lips are turning blue you need to go the ER for evaluation.

## 2022-10-09 NOTE — Progress Notes (Unsigned)
Cardiology Office Note  Date:  10/10/2022   ID:  Elder Love., DOB [birth year], MRN [id number]  PCP:  Olin Hauser, DO   Chief Complaint  Patient presents with   6 month follow up     "Doing well." Medications reviewed by the patient verbally.     HPI:  Mr. Rakestraw is a 71 year old gentleman with a history of  diabetes,  Hypertension, who initially presented for abnormal EKG. he had a colonoscopy. During the procedure, noted to have abnormal telemetry. EKG was performed that showed anterolateral wall abnormality.  echocardiogram showed depressed ejection fraction 25-30%.   2014 pharmacologic Myoview suggested mid to distal anterior wall perfusion defect concerning for ischemia. started on beta blocker and ACE inhibitor cardiac catheterization given the perfusion defect and severely depressed ejection fraction. -- no significant CAD, ejection fraction had improved up to 40%. Echo 9/20: EF is 30 to 35% He presents  for routine follow-up of his nonischemic cardiomyopathy and hypertension, has ICD  Last seen by myself in clinic February 2023 Tested COVID-positive October 05, 2022,  Reports having no symptoms from [place], wife was sick in the past  Reports feeling well in general denies shortness of breath, no chest pain, no tachy arrhythmia Denies near-syncope or syncope Notes significant leg swelling, no PND orthopnea  BP 962 systolic range  Now retired, staying busy in the daytime doing chores around the house  EKG personally reviewed by myself on todays visit Normal sinus rhythm rate 62 bpm, PVCs, consider septal MI, T wave abnormality V4 through V6 1 and aVL consider inferolateral ischemia  echo up to 30 to 35% September 2020 Declining repeat study on prior clinic visit  Followed by EP in [place]  Other past medical history reviewed On 05/03/2019,  playing baseball,  developed chest pain, collapsed,  unresponsive. Bystander started CPR   in ventricular  fibrillation and required 5 shocks while in the field with subsequent ROSC.  He was intubated  Total downtime estimated at approximately 5 minutes. Cooling protocol.  Troponin peaked at 0.69.   sustained VT on 05/06/2019 with successful defibrillation in the hospital  started on Amiodarone infusion  05/07/2019,  right and left heart catheterization with mild luminal irregularities   severely reduced LVEF (20-25%) with global hypokinesis  ICD was placed  PMH:   has a past medical history of CAD (coronary artery disease), Diabetes ([place]), Frequent PVCs, Heart failure with reduced ejection fraction ([place]), Hypertension, Impotence of organic origin, NICM (nonischemic cardiomyopathy) ([place]), Pain in joint, shoulder region, Personal history of colonic polyps, and Ventricular tachycardia ([place]).  PSH:    Past Surgical History:  Procedure Laterality Date   CARDIAC CATHETERIZATION  11/17/2013   COLONOSCOPY  2009   ICD IMPLANT N/A 05/08/2019   Procedure: ICD IMPLANT;  Surgeon: Thompson Grayer, MD;  Location: [facility];  Service: Cardiovascular;  Laterality: N/A;   RIGHT/LEFT HEART CATH AND CORONARY ANGIOGRAPHY N/A 05/07/2019   Procedure: RIGHT/LEFT HEART CATH AND CORONARY ANGIOGRAPHY;  Surgeon: Nelva Bush, MD;  Location: [facility];  Service: Cardiovascular;  Laterality: N/A;    Current Outpatient Medications  Medication Sig Dispense Refill   amiodarone (PACERONE) 200 MG tablet TAKE 1/2 TABLET EVERY DAY 45 tablet 3   benzonatate (TESSALON) 100 MG capsule Take 2 capsules (200 mg total) by mouth every 8 (eight) hours. 21 capsule 0   Blood Glucose Monitoring Suppl (ONE TOUCH ULTRA 2) w/Device KIT Use to check blood sugar x 1 daily 1 kit 0  brimonidine (ALPHAGAN) 0.2 % ophthalmic solution Place 1 drop into both eyes 2 (two) times daily.     carvedilol (COREG) 25 MG tablet Take 1 tablet (25 mg total) by mouth 2 (two) times daily. 180 tablet 3   Cholecalciferol (VITAMIN D3) 2000 units  TABS Take 1 capsule by mouth daily.      dorzolamide-timolol (COSOPT) 22.3-6.8 MG/ML ophthalmic solution Place 1 drop into both eyes 2 (two) times daily.     doxazosin (CARDURA) 1 MG tablet Take 1 tablet (1 mg total) by mouth 2 (two) times daily. 180 tablet 3   finasteride (PROSCAR) 5 MG tablet Take 5 mg by mouth daily.     ipratropium (ATROVENT) 0.06 % nasal spray Place 2 sprays into both nostrils 4 (four) times daily. 15 mL 12   isosorbide mononitrate (IMDUR) 60 MG 24 hr tablet Take 1.5 tablets (90 mg total) by mouth 2 (two) times daily. 270 tablet 3   latanoprost (XALATAN) 0.005 % ophthalmic solution Place 1 drop into both eyes at bedtime.      metFORMIN (GLUCOPHAGE-XR) 500 MG 24 hr tablet Take 2 tablets (1,000 mg total) by mouth daily with breakfast. 180 tablet 3   molnupiravir EUA (LAGEVRIO) 200 mg CAPS capsule Take 4 capsules (800 mg total) by mouth 2 (two) times daily for 5 days. 40 capsule 0   nitroGLYCERIN (NITROSTAT) 0.4 MG SL tablet DISSOLVE ONE TABLET UNDER THE TONGUE EVERY 5 MINUTES AS NEEDED FOR CHEST PAIN.  DO NOT EXCEED A TOTAL OF 3 DOSES IN 15 MINUTES 25 tablet 0   OneTouch Delica Lancets 23J MISC Use to check blood sugar up to 1 time daily 100 each 12   ONETOUCH ULTRA test strip Check blood sugar 1 x daily 100 each 12   polyvinyl alcohol (LIQUIFILM TEARS) 1.4 % ophthalmic solution Place 2 drops into both eyes every 8 (eight) hours. 15 mL 0   pravastatin (PRAVACHOL) 10 MG tablet TAKE 1 TABLET AT BEDTIME 90 tablet 0   promethazine-dextromethorphan (PROMETHAZINE-DM) 6.25-15 MG/5ML syrup Take 5 mLs by mouth 4 (four) times daily as needed. 118 mL 0   sacubitril-valsartan (ENTRESTO) 97-103 MG Take 1 tablet by mouth 2 (two) times daily. 28 tablet 1   spironolactone (ALDACTONE) 25 MG tablet Take 1 tablet (25 mg total) by mouth daily. 90 tablet 3   tamsulosin (FLOMAX) 0.4 MG CAPS capsule Take 0.4 mg by mouth daily.     timolol (TIMOPTIC) 0.5 % ophthalmic solution Place 1 drop into both  eyes 2 (two) times daily.     No current facility-administered medications for this visit.    Allergies:   Patient has no known allergies.   Social History:  The patient  reports that he has never smoked. He has never used smokeless tobacco. He reports current alcohol use. He reports that he does not use drugs.   Family History:   family history includes Colon cancer in his father; Diabetes in his mother; Hypertension in his mother.    Review of Systems: Review of Systems  Constitutional: Negative.   HENT: Negative.    Respiratory: Negative.    Cardiovascular: Negative.   Gastrointestinal: Negative.   Musculoskeletal: Negative.   Neurological: Negative.   Psychiatric/Behavioral: Negative.    All other systems reviewed and are negative.   PHYSICAL EXAM: VS:  BP (!) 160/84 (BP Location: Left Arm, Patient Position: Sitting, Cuff Size: Normal)   Pulse 62   Ht 5' 8"  (1.727 m)   Wt 166 lb 8 oz (75.5  kg)   SpO2 96%   BMI 25.32 kg/m  , BMI Body mass index is 25.32 kg/m. Constitutional:  oriented to person, place, and time. No distress.  HENT:  Head: Grossly normal Eyes:  no discharge. No scleral icterus.  Neck: No JVD, no carotid bruits  Cardiovascular: Regular rate and rhythm, no murmurs appreciated Pulmonary/Chest: Clear to auscultation bilaterally, no wheezes or rails Abdominal: Soft.  no distension.  no tenderness.  Musculoskeletal: Normal range of motion Neurological:  normal muscle tone. Coordination normal. No atrophy Skin: Skin warm and dry Psychiatric: normal affect, pleasant  Recent Labs: 10/04/2022: ALT 16; BUN 15; Creatinine, Ser 1.15; Potassium 4.3; Sodium 141; TSH 0.991    Lipid Panel Lab Results  Component Value Date   CHOL 154 11/15/2021   HDL 70 11/15/2021   LDLCALC 68 11/15/2021   TRIG 81 11/15/2021    Wt Readings from Last 3 Encounters:  10/10/22 166 lb 8 oz (75.5 kg)  10/05/22 164 lb (74.4 kg)  10/04/22 164 lb (74.4 kg)     ASSESSMENT AND  PLAN:  Congestive dilated cardiomyopathy ([place])  We have recommended that he continue carvedilol 25 twice daily, Cardura 1 twice daily, isosorbide 90 twice daily, Entresto 97/103 twice daily spironolactone 25 daily Given on Diabetes A1c greater than 7, recommended he add Jardiance 10 mg daily  Nonischemic cardiomyopathy Nonobstructive coronary disease by prior catheterization 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, blood pressure well controlled Ejection fraction 30 to 35% Add Jardiance as above  Controlled type 2 diabetes mellitus without complication, unspecified long term insulin use status (HCC) --  A1c 7.7 We have written prescription for Jardiance 10 mg daily  VF Defibrillator in place, no recent events Download reviewed with him, higher OptiVol Followed by EP   Total encounter time more than 30 minutes  Greater than 50% was spent in counseling and coordination of care with the patient    Orders Placed This Encounter  Procedures   EKG 12-Lead      Signed, Esmond Plants, M.D., Ph.D. 10/10/2022  [place], [place]

## 2022-10-10 ENCOUNTER — Encounter: Payer: Self-pay | Admitting: Cardiovascular Disease

## 2022-10-10 ENCOUNTER — Ambulatory Visit: Payer: Medicare HMO | Attending: Cardiovascular Disease | Admitting: Cardiovascular Disease

## 2022-10-10 ENCOUNTER — Other Ambulatory Visit: Payer: Self-pay

## 2022-10-10 VITALS — BP 160/84 | HR 62 | Ht 68.0 in | Wt 166.5 lb

## 2022-10-10 DIAGNOSIS — Z95810 Presence of automatic (implantable) cardiac defibrillator: Secondary | ICD-10-CM | POA: Diagnosis not present

## 2022-10-10 DIAGNOSIS — I10 Essential (primary) hypertension: Secondary | ICD-10-CM

## 2022-10-10 DIAGNOSIS — E1136 Type 2 diabetes mellitus with diabetic cataract: Secondary | ICD-10-CM | POA: Diagnosis not present

## 2022-10-10 DIAGNOSIS — R7989 Other specified abnormal findings of blood chemistry: Secondary | ICD-10-CM

## 2022-10-10 DIAGNOSIS — I472 Ventricular tachycardia, unspecified: Secondary | ICD-10-CM

## 2022-10-10 DIAGNOSIS — I428 Other cardiomyopathies: Secondary | ICD-10-CM

## 2022-10-10 DIAGNOSIS — R972 Elevated prostate specific antigen [PSA]: Secondary | ICD-10-CM

## 2022-10-10 DIAGNOSIS — I5022 Chronic systolic (congestive) heart failure: Secondary | ICD-10-CM

## 2022-10-10 DIAGNOSIS — I7090 Unspecified atherosclerosis: Secondary | ICD-10-CM | POA: Diagnosis not present

## 2022-10-10 DIAGNOSIS — I110 Hypertensive heart disease with heart failure: Secondary | ICD-10-CM | POA: Diagnosis not present

## 2022-10-10 DIAGNOSIS — N401 Enlarged prostate with lower urinary tract symptoms: Secondary | ICD-10-CM

## 2022-10-10 DIAGNOSIS — Z0000 Encounter for general adult medical examination without abnormal findings: Secondary | ICD-10-CM

## 2022-10-10 DIAGNOSIS — I4901 Ventricular fibrillation: Secondary | ICD-10-CM

## 2022-10-10 DIAGNOSIS — U071 COVID-19: Secondary | ICD-10-CM

## 2022-10-10 DIAGNOSIS — E7800 Pure hypercholesterolemia, unspecified: Secondary | ICD-10-CM

## 2022-10-10 MED ORDER — EMPAGLIFLOZIN 10 MG PO TABS: 10.0000 mg | ORAL_TABLET | Freq: Every day | ORAL | 5 refills | Status: DC

## 2022-10-10 NOTE — Patient Instructions (Addendum)
Medication Instructions:  Please start Jardiance 10 mg daily  If you need a refill on your cardiac medications before your next appointment, please call your pharmacy.   Lab work: No new labs needed  Testing/Procedures: No new testing needed  Follow-Up: At [facility], you and your health needs are our priority.  As part of our continuing mission to provide you with exceptional heart care, we have created designated Provider Care Teams.  These Care Teams include your primary Cardiologist (physician) and Advanced Practice Providers (APPs -  Physician Assistants and Nurse Practitioners) who all work together to provide you with the care you need, when you need it.  You will need a follow up appointment in 6 months  Providers on your designated Care Team:   Murray Hodgkins, NP Christell Faith, PA-C Cadence Kathlen Mody, [place]  COVID-19 Vaccine Information can be found at: ShippingScam.co.uk For questions related to vaccine distribution or appointments, please email [email or web address] or call [phone].

## 2022-10-11 ENCOUNTER — Other Ambulatory Visit: Payer: Medicare HMO

## 2022-10-11 DIAGNOSIS — N401 Enlarged prostate with lower urinary tract symptoms: Secondary | ICD-10-CM | POA: Diagnosis not present

## 2022-10-11 DIAGNOSIS — R7989 Other specified abnormal findings of blood chemistry: Secondary | ICD-10-CM | POA: Diagnosis not present

## 2022-10-11 DIAGNOSIS — E7800 Pure hypercholesterolemia, unspecified: Secondary | ICD-10-CM | POA: Diagnosis not present

## 2022-10-11 DIAGNOSIS — E1136 Type 2 diabetes mellitus with diabetic cataract: Secondary | ICD-10-CM | POA: Diagnosis not present

## 2022-10-11 DIAGNOSIS — I10 Essential (primary) hypertension: Secondary | ICD-10-CM | POA: Diagnosis not present

## 2022-10-11 DIAGNOSIS — R972 Elevated prostate specific antigen [PSA]: Secondary | ICD-10-CM | POA: Diagnosis not present

## 2022-10-11 DIAGNOSIS — R3911 Hesitancy of micturition: Secondary | ICD-10-CM | POA: Diagnosis not present

## 2022-10-11 DIAGNOSIS — I5022 Chronic systolic (congestive) heart failure: Secondary | ICD-10-CM | POA: Diagnosis not present

## 2022-10-11 DIAGNOSIS — I428 Other cardiomyopathies: Secondary | ICD-10-CM | POA: Diagnosis not present

## 2022-10-12 LAB — COMPLETE METABOLIC PANEL WITH GFR
AG Ratio: 1.7 (calc) (ref 1.0–2.5)
ALT: 9 U/L (ref 9–46)
AST: 9 U/L — ABNORMAL LOW (ref 10–35)
Albumin: 4 g/dL (ref 3.6–5.1)
Alkaline phosphatase (APISO): 47 U/L (ref 35–144)
BUN: 14 mg/dL (ref 7–25)
CO2: 29 mmol/L (ref 20–32)
Calcium: 9.3 mg/dL (ref 8.6–10.3)
Chloride: 106 mmol/L (ref 98–110)
Creat: 1.11 mg/dL (ref 0.70–1.35)
Globulin: 2.4 g/dL (calc) (ref 1.9–3.7)
Glucose, Bld: 137 mg/dL — ABNORMAL HIGH (ref 65–99)
Potassium: 4 mmol/L (ref 3.5–5.3)
Sodium: 142 mmol/L (ref 135–146)
Total Bilirubin: 0.5 mg/dL (ref 0.2–1.2)
Total Protein: 6.4 g/dL (ref 6.1–8.1)
eGFR: 72 mL/min/{1.73_m2} (ref >=60)

## 2022-10-12 LAB — CBC WITH DIFFERENTIAL/PLATELET
Absolute Monocytes: 638 cells/uL (ref 200–950)
Basophils Absolute: 29 cells/uL (ref 0–200)
Basophils Relative: 0.5 %
Eosinophils Absolute: 168 cells/uL (ref 15–500)
Eosinophils Relative: 2.9 %
HCT: 39.6 % (ref 38.5–50.0)
Hemoglobin: 12.8 g/dL — ABNORMAL LOW (ref 13.2–17.1)
Lymphs Abs: 1299 cells/uL (ref 850–3900)
MCH: 30.8 pg (ref 27.0–33.0)
MCHC: 32.3 g/dL (ref 32.0–36.0)
MCV: 95.2 fL (ref 80.0–100.0)
MPV: 11.4 fL (ref 7.5–12.5)
Monocytes Relative: 11 %
Neutro Abs: 3666 cells/uL (ref 1500–7800)
Neutrophils Relative %: 63.2 %
Platelets: 160 10*3/uL (ref 140–400)
RBC: 4.16 10*6/uL — ABNORMAL LOW (ref 4.20–5.80)
RDW: 13.8 % (ref 11.0–15.0)
Total Lymphocyte: 22.4 %
WBC: 5.8 10*3/uL (ref 3.8–10.8)

## 2022-10-12 LAB — HEMOGLOBIN A1C
Hgb A1c MFr Bld: 8.1 % of total Hgb — ABNORMAL HIGH (ref <5.7)
Mean Plasma Glucose: 186 mg/dL
eAG (mmol/L): 10.3 mmol/L

## 2022-10-12 LAB — T4, FREE: Free T4: 1.5 ng/dL (ref 0.8–1.8)

## 2022-10-12 LAB — LIPID PANEL
Cholesterol: 159 mg/dL (ref <200)
HDL: 73 mg/dL (ref >=40)
LDL Cholesterol (Calc): 70 mg/dL (calc)
Non-HDL Cholesterol (Calc): 86 mg/dL (calc) (ref <130)
Total CHOL/HDL Ratio: 2.2 (calc) (ref <5.0)
Triglycerides: 79 mg/dL (ref <150)

## 2022-10-12 LAB — PSA: PSA: 3.39 ng/mL (ref <=4.00)

## 2022-10-12 LAB — TSH: TSH: 1 mIU/L (ref 0.40–4.50)

## 2022-10-16 ENCOUNTER — Ambulatory Visit (INDEPENDENT_AMBULATORY_CARE_PROVIDER_SITE_OTHER): Payer: Medicare HMO

## 2022-10-16 DIAGNOSIS — Z95810 Presence of automatic (implantable) cardiac defibrillator: Secondary | ICD-10-CM | POA: Diagnosis not present

## 2022-10-16 DIAGNOSIS — I5022 Chronic systolic (congestive) heart failure: Secondary | ICD-10-CM | POA: Diagnosis not present

## 2022-10-17 ENCOUNTER — Telehealth: Payer: Self-pay

## 2022-10-17 NOTE — Progress Notes (Signed)
EPIC Encounter for ICM Monitoring  Patient Name: Russell [name]. is a 71 y.o. male Date: 10/17/2022 Primary Care Physican: Olin Hauser, DO Primary Cardiologist: Rockey Situ Electrophysiologist: Quentin Ore 08/10/2022 Weight: 164 lbs 09/15/2022 Weight: 164-165 lbs                                                          Attempted call to patient and unable to reach.  Transmission reviewed.    Optivol thoracic impedance normal but was suggesting possible fluid accumulation starting 10/8-10/20.   Prescribed: Spironolactone 25 mg take 1 tablet daily   Labs: 10/11/2022 Creatinine 1.11, BUN 14, Potassium 4.0, Sodium 142, GFR 72 10/04/2022 Creatinine 1.15, BUN 15, Potassium 4.3, Sodium 141  04/05/2022 Creatinine 1.23, BUN 13, Potassium 4.6, Sodium 139, GFR 64 A complete set of results can be found in Results Review.   Recommendations:  Unable to reach.     Follow-up plan: ICM clinic phone appointment on 11/20/2022.   91 day device clinic remote transmission 11/06/2022.    EP/Cardiology Next Office Visit:  04/16/2023 with Dr Rockey Situ.  Recall 04/02/2023 with Dr Quentin Ore.     Copy of ICM check sent to Dr. Quentin Ore.  3 month ICM trend: 10/16/2022.    12-14 Month ICM trend:     Rosalene Billings, RN 10/17/2022 7:09 AM

## 2022-10-17 NOTE — Telephone Encounter (Signed)
Remote ICM transmission received.  Attempted call to patient regarding ICM remote transmission and no answer or voice mail option.  

## 2022-10-18 ENCOUNTER — Ambulatory Visit (INDEPENDENT_AMBULATORY_CARE_PROVIDER_SITE_OTHER): Payer: Medicare HMO | Admitting: Family Medicine

## 2022-10-18 VITALS — BP 136/77 | HR 60 | Ht 68.0 in | Wt 162.0 lb

## 2022-10-18 DIAGNOSIS — I428 Other cardiomyopathies: Secondary | ICD-10-CM

## 2022-10-18 DIAGNOSIS — Z0000 Encounter for general adult medical examination without abnormal findings: Secondary | ICD-10-CM

## 2022-10-18 DIAGNOSIS — I5022 Chronic systolic (congestive) heart failure: Secondary | ICD-10-CM | POA: Diagnosis not present

## 2022-10-18 DIAGNOSIS — E785 Hyperlipidemia, unspecified: Secondary | ICD-10-CM

## 2022-10-18 DIAGNOSIS — N401 Enlarged prostate with lower urinary tract symptoms: Secondary | ICD-10-CM | POA: Diagnosis not present

## 2022-10-18 DIAGNOSIS — E1169 Type 2 diabetes mellitus with other specified complication: Secondary | ICD-10-CM

## 2022-10-18 DIAGNOSIS — Z23 Encounter for immunization: Secondary | ICD-10-CM | POA: Diagnosis not present

## 2022-10-18 DIAGNOSIS — R3911 Hesitancy of micturition: Secondary | ICD-10-CM | POA: Diagnosis not present

## 2022-10-18 DIAGNOSIS — E1136 Type 2 diabetes mellitus with diabetic cataract: Secondary | ICD-10-CM | POA: Diagnosis not present

## 2022-10-18 DIAGNOSIS — I10 Essential (primary) hypertension: Secondary | ICD-10-CM

## 2022-10-18 NOTE — Assessment & Plan Note (Signed)
Well-controlled HTN - Home BP readings  reviewed Complication with CHF   Plan:  1. Continue current BP regimen 2. Encourage improved lifestyle - low sodium diet, regular exercise 3.Continue monitor BP outside office, bring readings to next visit, if persistently >140/90 or new symptoms notify office sooner

## 2022-10-18 NOTE — Assessment & Plan Note (Signed)
Followed by Cardiology Euvolemic, no flare up On med management

## 2022-10-18 NOTE — Assessment & Plan Note (Signed)
Followed by Cardiology Has ICD

## 2022-10-18 NOTE — Assessment & Plan Note (Signed)
A1c improved from 8.5 down to 8.1 now on dose inc metformin No evidence of hypoglycemia Complications - DM cataracts, glaucoma, other including atherosclerotic vascular disease - increases risk of future cardiovascular complications   Plan: 1. CONTINUE Metformin XR 500mg  x2 = 1000mg  daily. We have discussed concern with CHF diagnosis with metformin and goal to limit maximum dose metformin, he has already been reduced previously. - Agree with jardiance from Cardiology, however higher tier cost for him, will refer to [facility] pharmacy for financial assistance eval and may benefit from this and could lower dose or DC Metformin in future 2. Encourage improved lifestyle - low carb, low sugar diet, reduce portion size, continue improving regular exercise - reviewed diet recommendation 3. Check CBG, bring log to next visit for review 4. Continue ACEi, ASA, Statin 5. DM Foot

## 2022-10-18 NOTE — Patient Instructions (Addendum)
Thank you for coming to the office today.  Referral to our clinical pharmacist Grayland Ormond - she can contact you to discuss financial assistance options for the Jardiance.  Keep current medication.  Future Shingles vaccine Shingrix  Future COVID Booster  Use moisturizer on feet to help prevent dry cracking skin.  Labs look great  Keep on double dose Metformin, sugar down to 8.1  Recent Labs    11/15/21 0801 06/14/22 1055 10/11/22 0802  HGBA1C 7.7* 8.5* 8.1*    Cholesterol is excellent.   Please schedule a Follow-up Appointment to: Return in about 6 months (around 04/19/2023) for 6 month DM A1c.  If you have any other questions or concerns, please feel free to call the office or send a message through [facility]. You may also schedule an earlier appointment if necessary.  Additionally, you may be receiving a survey about your experience at our office within a few days to 1 week by e-mail or mail. We value your feedback.  Nobie Putnam, DO [place]

## 2022-10-18 NOTE — Progress Notes (Signed)
Subjective:    Patient ID: Russell [name]., male    DOB: August 26, 1951, 71 y.o.   MRN: [id number]  Russell [name]. is a 71 y.o. 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 Tolerating well w/o side-effects Currently on ACEi He is tolerating it well. On Pravastatin 196mnightly, no side effects or myalgia Improved LDL on statin Has OneTouch Ultra 2 since last year. Lifestyle:  - Diet (tries to follow DM diet, occasional fried food) - Exercise (He has a gym pass and plans to resume exercise) - [facility], for glaucoma and cataracts Denies hypoglycemia, polyuria, visual changes, numbness or tingling.   History of COVID19 - resolved Recent infection, wife had it as well. He had limited symptoms.  CHRONIC HTN / Systolic CHF / Cardiomyopathy Followed by Cardiology Current Meds - Carvedilol 2558mID, Entrestro 97-103 BID, Isosorbide BID Reports good compliance, took meds today. Tolerating well, w/o complaints. Lifestyle: Denies CP, dyspnea, HA, edema, dizziness / lightheadedness    Health Maintenance:   Flu Shot today  Future COVID and Shingrix vaccines  Colon CA Screening: Last Colonoscopy 09/24/2013 (done by Dr ByrBary Castillaresults with no polyps, good for 10 years. Currently asymptomatic. known family history of colon CA  Cologuard done 09/01/20 - negative. Good for 3 years. Next due 2024   BPH Followed by Dr WolAmbrose Pancoastology yearly. On Tamsulosin daily Had MRI for prostate negative PSA stable at 3.39 prior results 3.6 to 3.4, prior peak  4      10/18/2022    9:57 AM 04/21/2022    9:46 AM 08/25/2021    9:08 AM  Depression screen PHQ 2/9  Decreased Interest 0 0 0  Down, Depressed, Hopeless 0 0 0  PHQ - 2 Score 0 0 0  Altered sleeping 0  0  Tired, decreased energy 0  0  Change in appetite 0  0  Feeling bad or failure about yourself  0  0  Trouble concentrating 0  0  Moving slowly or fidgety/restless 0  0  Suicidal thoughts 0  0  PHQ-9 Score 0  0  Difficult doing work/chores Not difficult at all  Not difficult at all    Past Medical History:  Diagnosis Date  . CAD (coronary artery disease)    05/07/2019 LHC showed nonobstructive CAD  . Diabetes (HCCHamilton . Frequent PVCs   . Heart failure with reduced ejection fraction ([place]  04/2019 EF 20-25%  . Hypertension   . Impotence of organic origin   . NICM (nonischemic cardiomyopathy) (HCCIndependence  HFrEF in setting of NICM  . Pain in joint, shoulder region   . Personal history of colonic polyps   . Ventricular tachycardia ([place]CC[place]  04/2019 in setting of cardiac arrest   Past Surgical History:  Procedure Laterality Date  . CARDIAC CATHETERIZATION  11/17/2013  . COLONOSCOPY  2009  . ICD IMPLANT N/A 05/08/2019   Procedure: ICD IMPLANT;  Surgeon: AllThompson GrayerD;  Location: [facility];  Service: Cardiovascular;  Laterality: N/A;  . RIGHT/LEFT HEART CATH AND CORONARY ANGIOGRAPHY N/A 05/07/2019   Procedure: RIGHT/LEFT HEART CATH AND CORONARY ANGIOGRAPHY;  Surgeon: Nelva Bush, MD;  Location: [place] [facility];  Service: Cardiovascular;  Laterality: N/A;   Social History   Socioeconomic History  . Marital status: Married    Spouse name: Not on file  . Number of children: Not on file  . Years of education: Not on file  . Highest education level: Not on file  Occupational History  . Occupation: [employer]  . Occupation: retired  Tobacco Use  . Smoking status: Never  . Smokeless tobacco: Never  Vaping Use  . Vaping Use: Never used  Substance and  Sexual Activity  . Alcohol use: Yes    Comment: occassional  . Drug use: No  . Sexual activity: Not on file  Other Topics Concern  . Not on file  Social History Narrative  . Not on file   Social Determinants of Health   Financial Resource Strain: Low Risk  (04/21/2022)   Overall Financial Resource Strain (CARDIA)   . Difficulty of Paying Living Expenses: Not hard at all  Food Insecurity: No Food Insecurity (04/21/2022)   Hunger Vital Sign   . Worried About Charity fundraiser in the Last Year: Never true   . Ran Out of Food in the Last Year: Never true  Transportation Needs: No Transportation Needs (04/21/2022)   PRAPARE - Transportation   . Lack of Transportation (Medical): No   . Lack of Transportation (Non-Medical): No  Physical Activity: Insufficiently Active (04/21/2022)   Exercise Vital Sign   . Days of Exercise per Week: 3 days   . Minutes of Exercise per Session: 40 min  Stress: No Stress Concern Present (04/21/2022)   [place]   . Feeling of Stress : Not at all  Social Connections: Moderately Integrated (04/21/2022)   Social Connection and Isolation Panel [NHANES]   . Frequency of Communication with Friends and Family: More than three times a week   . Frequency of Social Gatherings with Friends and Family: Twice a week   . Attends Religious Services: More than 4 times per year   . Active Member of Clubs or Organizations: No   . Attends Archivist Meetings: Never   . Marital Status: Married  Human resources officer Violence: Not At Risk (04/21/2022)   Humiliation, Afraid, Rape, and Kick questionnaire   . Fear of Current or Ex-Partner: No   . Emotionally Abused: No   . Physically Abused: No   . Sexually Abused: No   Family History  Problem Relation Age of Onset  . Colon cancer Father        1970's  . Hypertension Mother   . Diabetes Mother    Current Outpatient Medications on File Prior to Visit   Medication Sig  . amiodarone (PACERONE) 200 MG tablet TAKE 1/2 TABLET EVERY DAY  . Blood Glucose Monitoring Suppl (ONE TOUCH ULTRA 2) w/Device KIT Use to check blood sugar x 1 daily  . brimonidine (ALPHAGAN) 0.2 % ophthalmic solution Place 1 drop into both eyes 2 (two) times daily.  . carvedilol (COREG) 25 MG tablet Take 1 tablet (25 mg total) by mouth 2 (two) times daily.  . Cholecalciferol (VITAMIN D3) 2000 units TABS Take 1 capsule by mouth daily.   . dorzolamide-timolol (COSOPT) 22.3-6.8 MG/ML ophthalmic solution Place 1 drop into both eyes 2 (two) times daily.  Marland Kitchen  doxazosin (CARDURA) 1 MG tablet Take 1 tablet (1 mg total) by mouth 2 (two) times daily.  . empagliflozin (JARDIANCE) 10 MG TABS tablet Take 1 tablet (10 mg total) by mouth daily before breakfast.  . finasteride (PROSCAR) 5 MG tablet Take 5 mg by mouth daily.  Marland Kitchen ipratropium (ATROVENT) 0.06 % nasal spray Place 2 sprays into both nostrils 4 (four) times daily.  . isosorbide mononitrate (IMDUR) 60 MG 24 hr tablet Take 1.5 tablets (90 mg total) by mouth 2 (two) times daily.  Marland Kitchen latanoprost (XALATAN) 0.005 % ophthalmic solution Place 1 drop into both eyes at bedtime.   . metFORMIN (GLUCOPHAGE-XR) 500 MG 24 hr tablet Take 2 tablets (1,000 mg total) by mouth daily with breakfast.  . nitroGLYCERIN (NITROSTAT) 0.4 MG SL tablet DISSOLVE ONE TABLET UNDER THE TONGUE EVERY 5 MINUTES AS NEEDED FOR CHEST PAIN.  DO NOT EXCEED A TOTAL OF 3 DOSES IN 15 MINUTES  . OneTouch Delica Lancets 62Z MISC Use to check blood sugar up to 1 time daily  . ONETOUCH ULTRA test strip Check blood sugar 1 x daily  . polyvinyl alcohol (LIQUIFILM TEARS) 1.4 % ophthalmic solution Place 2 drops into both eyes every 8 (eight) hours.  . pravastatin (PRAVACHOL) 10 MG tablet TAKE 1 TABLET AT BEDTIME  . sacubitril-valsartan (ENTRESTO) 97-103 MG Take 1 tablet by mouth 2 (two) times daily.  . tamsulosin (FLOMAX) 0.4 MG CAPS capsule Take 0.4 mg by mouth daily.  . timolol  (TIMOPTIC) 0.5 % ophthalmic solution Place 1 drop into both eyes 2 (two) times daily.  Marland Kitchen spironolactone (ALDACTONE) 25 MG tablet Take 1 tablet (25 mg total) by mouth daily.   No current facility-administered medications on file prior to visit.    Review of Systems  Constitutional:  Negative for activity change, appetite change, chills, diaphoresis, fatigue and fever.  HENT:  Negative for congestion and hearing loss.   Eyes:  Negative for visual disturbance.  Respiratory:  Negative for cough, chest tightness, shortness of breath and wheezing.   Cardiovascular:  Negative for chest pain, palpitations and leg swelling.  Gastrointestinal:  Negative for abdominal pain, constipation, diarrhea, nausea and vomiting.  Genitourinary:  Negative for dysuria, frequency and hematuria.  Musculoskeletal:  Negative for arthralgias and neck pain.  Skin:  Negative for rash.  Neurological:  Negative for dizziness, weakness, light-headedness, numbness and headaches.  Hematological:  Negative for adenopathy.  Psychiatric/Behavioral:  Negative for behavioral problems, dysphoric mood and sleep disturbance.    Per HPI unless specifically indicated above     Objective:    BP 136/77   Pulse 60   Ht 5' 8"  (1.727 m)   Wt 162 lb (73.5 kg)   SpO2 98%   BMI 24.63 kg/m   Wt Readings from Last 3 Encounters:  10/18/22 162 lb (73.5 kg)  10/10/22 166 lb 8 oz (75.5 kg)  10/05/22 164 lb (74.4 kg)    Physical Exam Vitals and nursing note reviewed.  Constitutional:      General: He is not in acute distress.    Appearance: He is well-developed. He is not diaphoretic.     Comments: Well-appearing, comfortable, cooperative  HENT:     Head: Normocephalic and atraumatic.  Eyes:     General:        Right eye: No discharge.        Left eye: No discharge.     Conjunctiva/sclera: Conjunctivae normal.     Pupils: Pupils are equal, round, and reactive to light.  Neck:     Thyroid: No thyromegaly.     Vascular: No  carotid bruit.  Cardiovascular:     Rate and Rhythm: Normal rate and regular rhythm.     Pulses: Normal pulses.     Heart sounds: Normal heart sounds. No murmur heard. Pulmonary:     Effort: Pulmonary effort is normal. No respiratory distress.     Breath sounds: Normal breath sounds. No wheezing or rales.  Abdominal:     General: Bowel sounds are normal. There is no distension.     Palpations: Abdomen is soft. There is no mass.     Tenderness: There is no abdominal tenderness.  Musculoskeletal:        General: No tenderness. Normal range of motion.     Cervical back: Normal range of motion and neck supple.     Right lower leg: No edema.     Left lower leg: No edema.     Comments: Upper / Lower Extremities: - Normal muscle tone, strength bilateral upper extremities 5/5, lower extremities 5/5  Lymphadenopathy:     Cervical: No cervical adenopathy.  Skin:    General: Skin is warm and dry.     Findings: No erythema or rash.  Neurological:     Mental Status: He is alert and oriented to person, place, and time.     Comments: Distal sensation intact to light touch all extremities  Psychiatric:        Mood and Affect: Mood normal.        Behavior: Behavior normal.        Thought Content: Thought content normal.     Comments: Well groomed, good eye contact, normal speech and thoughts    Diabetic Foot Exam - Simple   Simple Foot Form Diabetic Foot exam was performed with the following findings: Yes 10/18/2022 10:06 AM  Visual Inspection See comments: Yes Sensation Testing Intact to touch and monofilament testing bilaterally: Yes Pulse Check Posterior Tibialis and Dorsalis pulse intact bilaterally: Yes Comments Bilateral callus formation forefoot great toe and heel. Intact monofilament.      Results for orders placed or performed in visit on 10/10/22  T4, free  Result Value Ref Range   Free T4 1.5 0.8 - 1.8 ng/dL  TSH  Result Value Ref Range   TSH 1.00 0.40 - 4.50 mIU/L   PSA  Result Value Ref Range   PSA 3.39 < OR = 4.00 ng/mL  Hemoglobin A1c  Result Value Ref Range   Hgb A1c MFr Bld 8.1 (H) <5.7 % of total Hgb   Mean Plasma Glucose 186 mg/dL   eAG (mmol/L) 10.3 mmol/L  Lipid panel  Result Value Ref Range   Cholesterol 159 <200 mg/dL   HDL 73 > OR = 40 mg/dL   Triglycerides 79 <150 mg/dL   LDL Cholesterol (Calc) 70 mg/dL (calc)   Total CHOL/HDL Ratio 2.2 <5.0 (calc)   Non-HDL Cholesterol (Calc) 86 <130 mg/dL (calc)  CBC with Differential/Platelet  Result Value Ref Range   WBC 5.8 3.8 - 10.8 Thousand/uL   RBC 4.16 (L) 4.20 - 5.80 Million/uL   Hemoglobin 12.8 (L) 13.2 - 17.1 g/dL   HCT 39.6 38.5 - 50.0 %   MCV 95.2 80.0 - 100.0 fL   MCH 30.8 27.0 - 33.0 pg   MCHC 32.3 32.0 - 36.0 g/dL   RDW 13.8 11.0 - 15.0 %   Platelets 160 140 - 400 Thousand/uL   MPV 11.4 7.5 - 12.5 fL  Neutro Abs 3,666 1,500 - 7,800 cells/uL   Lymphs Abs 1,299 850 - 3,900 cells/uL   Absolute Monocytes 638 200 - 950 cells/uL   Eosinophils Absolute 168 15 - 500 cells/uL   Basophils Absolute 29 0 - 200 cells/uL   Neutrophils Relative % 63.2 %   Total Lymphocyte 22.4 %   Monocytes Relative 11.0 %   Eosinophils Relative 2.9 %   Basophils Relative 0.5 %  COMPLETE METABOLIC PANEL WITH GFR  Result Value Ref Range   Glucose, Bld 137 (H) 65 - 99 mg/dL   BUN 14 7 - 25 mg/dL   Creat 1.11 0.70 - 1.35 mg/dL   eGFR 72 > OR = 60 mL/min/1.45m   BUN/Creatinine Ratio SEE NOTE: 6 - 22 (calc)   Sodium 142 135 - 146 mmol/L   Potassium 4.0 3.5 - 5.3 mmol/L   Chloride 106 98 - 110 mmol/L   CO2 29 20 - 32 mmol/L   Calcium 9.3 8.6 - 10.3 mg/dL   Total Protein 6.4 6.1 - 8.1 g/dL   Albumin 4.0 3.6 - 5.1 g/dL   Globulin 2.4 1.9 - 3.7 g/dL (calc)   AG Ratio 1.7 1.0 - 2.5 (calc)   Total Bilirubin 0.5 0.2 - 1.2 mg/dL   Alkaline phosphatase (APISO) 47 35 - 144 U/L   AST 9 (L) 10 - 35 U/L   ALT 9 9 - 46 U/L      Assessment & Plan:   Problem List Items Addressed This Visit      Benign prostatic hyperplasia with urinary hesitancy   Chronic systolic heart failure (HCC)    Followed by Cardiology Euvolemic, no flare up On med management      Relevant Orders   AMB Referral to Pharmacy Medication Management   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 (HCC)    A1c improved from 8.5 down to 8.1 now on dose inc metformin No evidence of hypoglycemia Complications - DM cataracts, glaucoma, other including atherosclerotic vascular disease - increases risk of future cardiovascular complications   Plan: 1. CONTINUE Metformin XR 5022mx2 = 100075maily. We have discussed concern with CHF diagnosis with metformin and goal to limit maximum dose metformin, he has already been reduced previously. - Agree with jardiance from Cardiology, however higher tier cost for him, will refer to CCM pharmacy for financial assistance eval and may benefit from this and could lower dose or DC Metformin in future 2. Encourage improved lifestyle - low carb, low sugar diet, reduce portion size, continue improving regular exercise - reviewed diet recommendation 3. Check CBG, bring log to next visit for review 4. Continue ACEi, ASA, Statin 5. DM Foot       Relevant Orders   AMB Referral to Pharmacy Medication Management   Urine Microalbumin w/creat. ratio   Essential hypertension    Well-controlled HTN - Home BP readings  reviewed Complication with CHF   Plan:  1. Continue current BP regimen 2. Encourage improved lifestyle - low sodium diet, regular exercise 3.Continue monitor BP outside office, bring readings to next visit, if persistently >140/90 or new symptoms notify office sooner      Hyperlipidemia associated with type 2 diabetes mellitus (HCCCaroline  Controlled On Pravastatin      Nonischemic cardiomyopathy (HCCMunjor  Followed by Cardiology Has ICD      Other Visit Diagnoses     Annual physical exam    -  Primary   Needs flu shot  Relevant Orders   Flu Vaccine  QUAD High Dose(Fluad) (Completed)       Updated Health Maintenance information Reviewed recent lab results with patient Encouraged improvement to lifestyle with diet and exercise Goal of weight loss   No orders of the defined types were placed in this encounter.    Follow up plan: Return in about 6 months (around 04/19/2023) for 6 month DM A1c.  Nobie Putnam, [facility] 10/18/2022, 9:48 AM

## 2022-10-18 NOTE — Assessment & Plan Note (Signed)
Controlled On Pravastatin

## 2022-10-19 ENCOUNTER — Telehealth: Payer: Self-pay

## 2022-10-19 LAB — MICROALBUMIN / CREATININE URINE RATIO
Creatinine, Urine: 188 mg/dL (ref 20–320)
Microalb Creat Ratio: 2 mcg/mg creat (ref <30)
Microalb, Ur: 0.4 mg/dL

## 2022-10-19 NOTE — Chronic Care Management (AMB) (Signed)
   Care Guide Note  10/19/2022 Name: Russell [name]. MRN: [id number] DOB: 10/24/1951  Referred by: Olin Hauser, DO Reason for referral : Care Coordination (Outreach to schedule referral with Pharm D [facility] )   Russell Love. is a 71 y.o. year old male who is a primary care patient of Olin Hauser, DO. Russell Love. was referred to the pharmacist for assistance related to DM.    An unsuccessful telephone outreach was attempted today to contact the patient who was referred to the pharmacy team for assistance with medication assistance. Additional attempts will be made to contact the patient.   Noreene Larsson, [place], [place] [address] Direct Dial: [phone] [email or web address]

## 2022-11-01 NOTE — Progress Notes (Signed)
  Care Coordination  Note  11/01/2022 Name: Russell [name]. MRN: [id number] DOB: Nov 23, 1951  Russell [name]. is a 71 y.o. year old male who is a primary care patient of Smitty Cords, DO. I reached out to Russell [name]. by phone today to offer care coordination services.      Russell [name] was given information about Care Coordination services today including:  The Care Coordination services include support from the care team which includes your Nurse Coordinator, Clinical Social Worker, or Pharmacist.  The Care Coordination team is here to help remove barriers to the health concerns and goals most important to you. Care Coordination services are voluntary and the patient may decline or stop services at any time by request to their care team member.   Patient agreed to services and verbal consent obtained.   Follow up plan: Telephone appointment with care coordination team member scheduled for:11/20/2022  Penne Lash, RMA Care Guide [facility]  [place], [place] [address] Direct Dial: [phone] [email or web address]

## 2022-11-05 ENCOUNTER — Other Ambulatory Visit: Payer: Self-pay | Admitting: Family Medicine

## 2022-11-05 DIAGNOSIS — E1136 Type 2 diabetes mellitus with diabetic cataract: Secondary | ICD-10-CM

## 2022-11-06 ENCOUNTER — Ambulatory Visit (INDEPENDENT_AMBULATORY_CARE_PROVIDER_SITE_OTHER): Payer: Medicare HMO

## 2022-11-06 DIAGNOSIS — I5022 Chronic systolic (congestive) heart failure: Secondary | ICD-10-CM

## 2022-11-06 NOTE — Telephone Encounter (Signed)
Requested Prescriptions  Pending Prescriptions Disp Refills   pravastatin (PRAVACHOL) 10 MG tablet [Pharmacy Med Name: PRAVASTATIN SODIUM 10 MG Tablet] 90 tablet 3    Sig: TAKE 1 TABLET AT BEDTIME     Cardiovascular:  Antilipid - Statins Failed - 11/05/2022 12:52 AM      Failed - Lipid Panel in normal range within the last 12 months    Cholesterol, Total  Date Value Ref Range Status  04/13/2017 152  Final   Cholesterol  Date Value Ref Range Status  10/11/2022 159 <200 mg/dL Final   LDL Cholesterol (Calc)  Date Value Ref Range Status  10/11/2022 70 mg/dL (calc) Final    Comment:    Reference range: <100 . Desirable range <100 mg/dL for primary prevention;   <70 mg/dL for patients with CHD or diabetic patients  with > or = 2 CHD risk factors. Marland Kitchen LDL-C is now calculated using the Martin-Hopkins  calculation, which is a validated novel method providing  better accuracy than the Friedewald equation in the  estimation of LDL-C.  Horald Pollen et al. Lenox Ahr. 8119;147(82): 2061-2068  (http://education.[facility].com/faq/FAQ164)    HDL Cholesterol  Date Value Ref Range Status  04/13/2017 64  Final   HDL  Date Value Ref Range Status  10/11/2022 73 > OR = 40 mg/dL Final   Triglycerides  Date Value Ref Range Status  10/11/2022 79 <150 mg/dL Final  95/62/1308 56  Final         Passed - Patient is not pregnant      Passed - Valid encounter within last 12 months    Recent Outpatient Visits           2 weeks ago Annual physical exam   [facility], Netta Neat, DO   4 months ago 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 [facility])   [facility] Smitty Cords, DO   11 months ago 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 [facility])   [facility] Smitty Cords, DO   1 year ago Annual physical exam   [facility] Smitty Cords, DO   1 year ago Controlled type 2  diabetes mellitus with cataract [facility])   [facility] Smitty Cords, DO       Future Appointments             In 5 months Gollan, Tollie Pizza, MD [facility] [place]. [facility]   In 5 months Althea Charon, Netta Neat, DO [facility], [facility]

## 2022-11-07 LAB — CUP PACEART REMOTE DEVICE CHECK
Battery Remaining Longevity: 106 mo
Battery Voltage: 3.01 V
Brady Statistic RV Percent Paced: 0.07 %
Date Time Interrogation Session: 20231113002203
HighPow Impedance: 57 Ohm
Implantable Lead Connection Status: 753985
Implantable Lead Implant Date: 20200514
Implantable Lead Location: 753860
Implantable Lead Model: 6935
Implantable Pulse Generator Implant Date: 20200514
Lead Channel Impedance Value: 323 Ohm
Lead Channel Impedance Value: 399 Ohm
Lead Channel Pacing Threshold Amplitude: 0.875 V
Lead Channel Pacing Threshold Pulse Width: 0.4 ms
Lead Channel Sensing Intrinsic Amplitude: 11.875 mV
Lead Channel Sensing Intrinsic Amplitude: 11.875 mV
Lead Channel Setting Pacing Amplitude: 2 V
Lead Channel Setting Pacing Pulse Width: 0.4 ms
Lead Channel Setting Sensing Sensitivity: 0.3 mV
Zone Setting Status: 755011
Zone Setting Status: 755011

## 2022-11-09 ENCOUNTER — Other Ambulatory Visit: Payer: Self-pay | Admitting: Family Medicine

## 2022-11-09 NOTE — Telephone Encounter (Signed)
Unable to refill per protocol, request is too soon. Last refill 01/30/22 for 180 and 3 RF. Will refuse request.  Requested Prescriptions  Pending Prescriptions Disp Refills   doxazosin (CARDURA) 1 MG tablet 180 tablet 3    Sig: Take 1 tablet (1 mg total) by mouth 2 (two) times daily.     Cardiovascular:  Alpha Blockers Passed - 11/09/2022 11:34 AM      Passed - Last BP in normal range    BP Readings from Last 1 Encounters:  10/18/22 136/77         Passed - Valid encounter within last 6 months    Recent Outpatient Visits           3 weeks ago Annual physical exam   [facility], Netta Neat, DO   4 months ago 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 [facility])   [facility] Smit[facility] Cords, DO   11 months ago 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 [facility])   [facility] Smitty Cords, DO   1 year ago Annual physical exam   [facility] Smitty Cords, DO   1 year ago 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 [facility])   [facility] Smitty Cords, DO       Future Appointments             In 5 months Gollan, Tollie Pizza, MD [facility] [place]. [facility]   In 5 months Althea Charon, Netta Neat, DO [facility], [facility]

## 2022-11-09 NOTE — Telephone Encounter (Signed)
Medication Refill - Medication: doxazosin (CARDURA) 1 MG tablet / not sure if this is from Dr. Kirtland Bouchard or Cardiologist / they both have filled it before   Has the patient contacted their pharmacy? Yes.   (Agent: If no, request that the patient contact the pharmacy for the refill. If patient does not wish to contact the pharmacy document the reason why and proceed with request.) (Agent: If yes, when and what did the pharmacy advise?)  Preferred Pharmacy (with phone number or street name): [facility]  Has the patient been seen for an appointment in the last year OR does the patient have an upcoming appointment? Yes.    Agent: Please be advised that RX refills may take up to 3 business days. We ask that you follow-up with your pharmacy.

## 2022-11-11 ENCOUNTER — Other Ambulatory Visit: Payer: Self-pay | Admitting: Cardiovascular Disease

## 2022-11-13 ENCOUNTER — Other Ambulatory Visit: Payer: Self-pay | Admitting: Cardiovascular Disease

## 2022-11-14 ENCOUNTER — Telehealth: Payer: Self-pay | Admitting: Cardiovascular Disease

## 2022-11-14 MED ORDER — DOXAZOSIN MESYLATE 1 MG PO TABS: 1.0000 mg | ORAL_TABLET | Freq: Two times a day (BID) | ORAL | 2 refills | Status: DC

## 2022-11-14 NOTE — Telephone Encounter (Signed)
Patient dropped off PAF placed in box 

## 2022-11-14 NOTE — Telephone Encounter (Signed)
Requested Prescriptions   Signed Prescriptions Disp Refills   doxazosin (CARDURA) 1 MG tablet 180 tablet 2    Sig: Take 1 tablet (1 mg total) by mouth 2 (two) times daily.    Authorizing Provider: Antonieta Iba    Ordering User: Kendrick Fries

## 2022-11-14 NOTE — Telephone Encounter (Signed)
*  STAT* If patient is at the pharmacy, call can be transferred to refill team.   1. Which medications need to be refilled? (please list name of each medication and dose if known) Doxazosun 1mg   2. Which pharmacy/location (including street and city if local pharmacy) is medication to be sent to[address]  3. Do they need a 30 day or 90 day supply? 90 day

## 2022-11-14 NOTE — Telephone Encounter (Signed)
PAF given to MD to sign

## 2022-11-14 NOTE — Telephone Encounter (Signed)
Forms faxed to [employer] for review.

## 2022-11-16 IMAGING — MR MR PROSTATE WO/W CM
56 series · 56 of 56 positions shown · IV contrast (Contrast agent)
Comparison: CT angiography of the chest, abdomen and pelvis from

CLINICAL DATA: A 70-year-old male presents for evaluation of the
prostate with elevated PSA.

EXAM:
MR PROSTATE WITHOUT AND WITH CONTRAST
TECHNIQUE: Multiplanar multisequence MRI images were obtained of the pelvis
centered about the prostate. Pre and post contrast images were
obtained.
CONTRAST:  7.5mL GADAVIST GADOBUTROL 1 MMOL/ML IV SOLN

[Series 3: ax in&out whole · axial · 5.0mm · 0.74mm/px · 1 of 35 slices shown (1 of 2)]
[im 1/35]
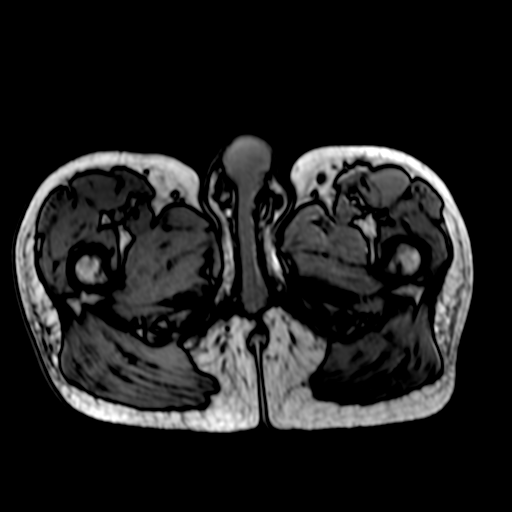

[Series 3: ax in&out whole · axial · 5.0mm · 0.74mm/px · 1 of 35 slices shown (2 of 2)]
[im 1/35]
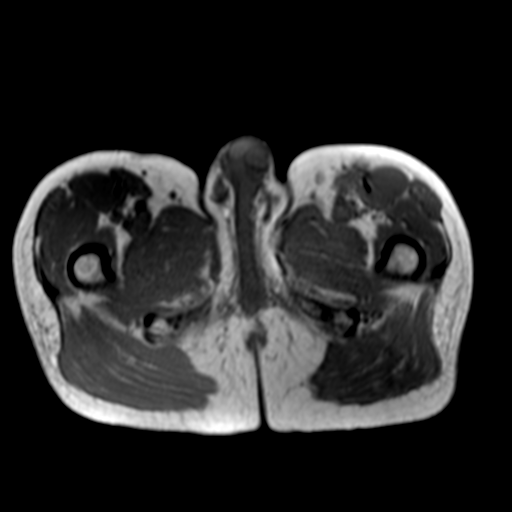

[Series 4: T2 · axial · 3.0mm · 0.56mm/px · 1 of 37 slices shown (1 of 3)]
[im 1/37]
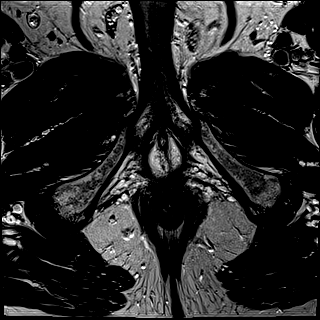

[Series 5: T2 · coronal · 3.0mm · 0.70mm/px · 1 of 35 slices shown (2 of 3)]
[im 1/35]
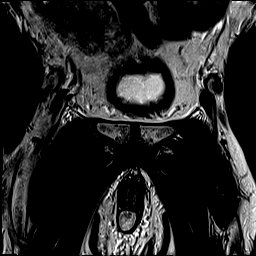

[Series 6: DWI · axial · 3.0mm · 0.86mm/px · 1 of 93 slices shown (1 of 3)]
[im 1/93]
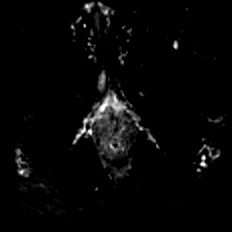

[Series 7: DWI · axial · 3.0mm · 0.86mm/px · 1 of 31 slices shown (2 of 3)]
[im 1/31]
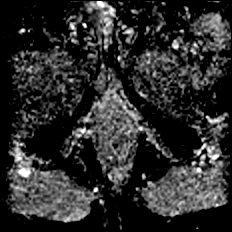

[Series 8: DWI · axial · 3.0mm · 0.86mm/px · 1 of 31 slices shown (3 of 3)]
[im 1/31]
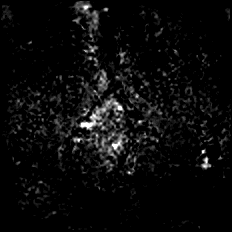

[Series 9: T2 · axial · 1.8mm · 1.04mm/px · 1 of 72 slices shown (3 of 3)]
[im 1/72]
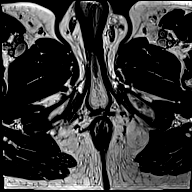

[Series 10: T1 · axial · 3.0mm · 1.15mm/px · 1 of 32 slices shown (1 of 48)]
[im 1/32]
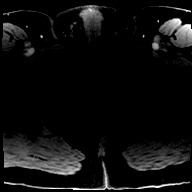

[Series 11: T1 · axial · 3.0mm · 1.15mm/px · 1 of 32 slices shown (2 of 48)]
[im 1/32]
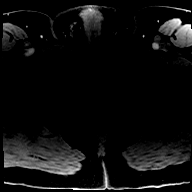

[Series 12: T1 · axial · 3.0mm · 1.15mm/px · 1 of 32 slices shown (3 of 48)]
[im 1/32]
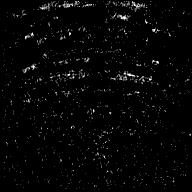

[Series 13: T1 · axial · 3.0mm · 1.15mm/px · 1 of 32 slices shown (4 of 48)]
[im 1/32]
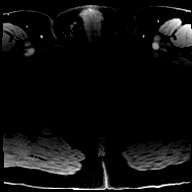

[Series 14: T1 · axial · 3.0mm · 1.15mm/px · 1 of 32 slices shown (5 of 48)]
[im 1/32]
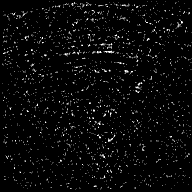

[Series 15: T1 · axial · 3.0mm · 1.15mm/px · 1 of 32 slices shown (6 of 48)]
[im 1/32]
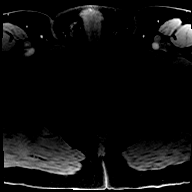

[Series 16: T1 · axial · 3.0mm · 1.15mm/px · 1 of 32 slices shown (7 of 48)]
[im 1/32]
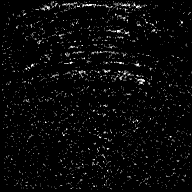

[Series 17: T1 · axial · 3.0mm · 1.15mm/px · 1 of 32 slices shown (8 of 48)]
[im 1/32]
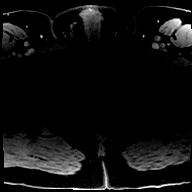

[Series 18: T1 · axial · 3.0mm · 1.15mm/px · 1 of 32 slices shown (9 of 48)]
[im 1/32]
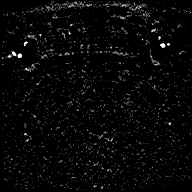

[Series 19: T1 · axial · 3.0mm · 1.15mm/px · 1 of 32 slices shown (10 of 48)]
[im 1/32]
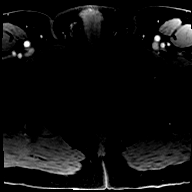

[Series 20: T1 · axial · 3.0mm · 1.15mm/px · 1 of 32 slices shown (11 of 48)]
[im 1/32]
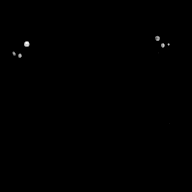

[Series 21: T1 · axial · 3.0mm · 1.15mm/px · 1 of 32 slices shown (12 of 48)]
[im 1/32]
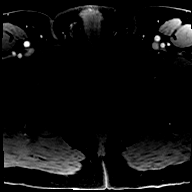

[Series 22: T1 · axial · 3.0mm · 1.15mm/px · 1 of 32 slices shown (13 of 48)]
[im 1/32]
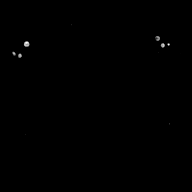

[Series 23: T1 · axial · 3.0mm · 1.15mm/px · 1 of 32 slices shown (14 of 48)]
[im 1/32]
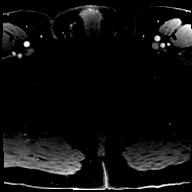

[Series 24: T1 · axial · 3.0mm · 1.15mm/px · 1 of 32 slices shown (15 of 48)]
[im 1/32]
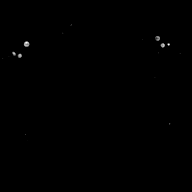

[Series 25: T1 · axial · 3.0mm · 1.15mm/px · 1 of 32 slices shown (16 of 48)]
[im 1/32]
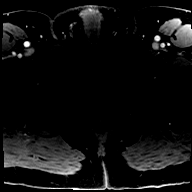

[Series 26: T1 · axial · 3.0mm · 1.15mm/px · 1 of 32 slices shown (17 of 48)]
[im 1/32]
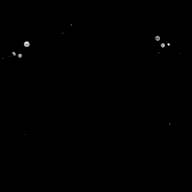

[Series 27: T1 · axial · 3.0mm · 1.15mm/px · 1 of 32 slices shown (18 of 48)]
[im 1/32]
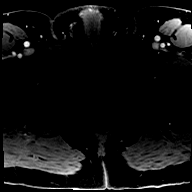

[Series 28: T1 · axial · 3.0mm · 1.15mm/px · 1 of 32 slices shown (19 of 48)]
[im 1/32]
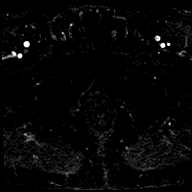

[Series 29: T1 · axial · 3.0mm · 1.15mm/px · 1 of 32 slices shown (20 of 48)]
[im 1/32]
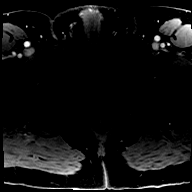

[Series 30: T1 · axial · 3.0mm · 1.15mm/px · 1 of 32 slices shown (21 of 48)]
[im 1/32]
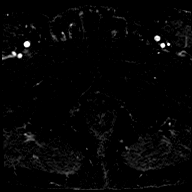

[Series 31: T1 · axial · 3.0mm · 1.15mm/px · 1 of 32 slices shown (22 of 48)]
[im 1/32]
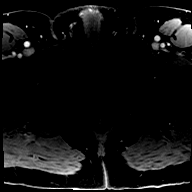

[Series 32: T1 · axial · 3.0mm · 1.15mm/px · 1 of 32 slices shown (23 of 48)]
[im 1/32]
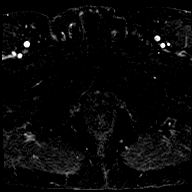

[Series 33: T1 · axial · 3.0mm · 1.15mm/px · 1 of 32 slices shown (24 of 48)]
[im 1/32]
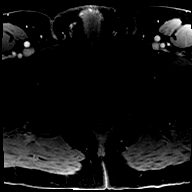

[Series 34: T1 · axial · 3.0mm · 1.15mm/px · 1 of 32 slices shown (25 of 48)]
[im 1/32]
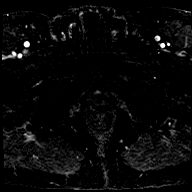

[Series 35: T1 · axial · 3.0mm · 1.15mm/px · 1 of 32 slices shown (26 of 48)]
[im 1/32]
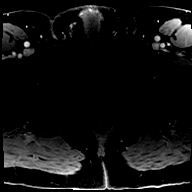

[Series 36: T1 · axial · 3.0mm · 1.15mm/px · 1 of 32 slices shown (27 of 48)]
[im 1/32]
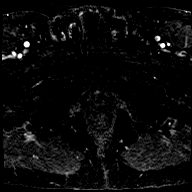

[Series 37: T1 · axial · 3.0mm · 1.15mm/px · 1 of 32 slices shown (28 of 48)]
[im 1/32]
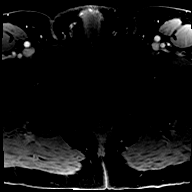

[Series 38: T1 · axial · 3.0mm · 1.15mm/px · 1 of 32 slices shown (29 of 48)]
[im 1/32]
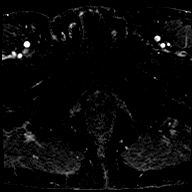

[Series 39: T1 · axial · 3.0mm · 1.15mm/px · 1 of 32 slices shown (30 of 48)]
[im 1/32]
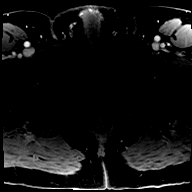

[Series 40: T1 · axial · 3.0mm · 1.15mm/px · 1 of 32 slices shown (31 of 48)]
[im 1/32]
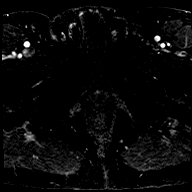

[Series 41: T1 · axial · 3.0mm · 1.15mm/px · 1 of 32 slices shown (32 of 48)]
[im 1/32]
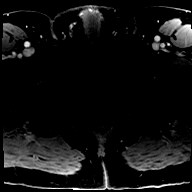

[Series 42: T1 · axial · 3.0mm · 1.15mm/px · 1 of 32 slices shown (33 of 48)]
[im 1/32]
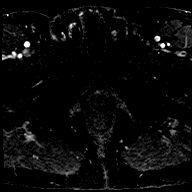

[Series 43: T1 · axial · 3.0mm · 1.15mm/px · 1 of 32 slices shown (34 of 48)]
[im 1/32]
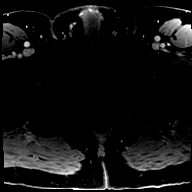

[Series 44: T1 · axial · 3.0mm · 1.15mm/px · 1 of 32 slices shown (35 of 48)]
[im 1/32]
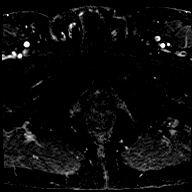

[Series 45: T1 · axial · 3.0mm · 1.15mm/px · 1 of 32 slices shown (36 of 48)]
[im 1/32]
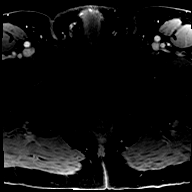

[Series 46: T1 · axial · 3.0mm · 1.15mm/px · 1 of 32 slices shown (37 of 48)]
[im 1/32]
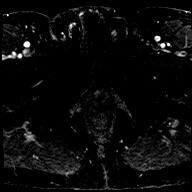

[Series 47: T1 · axial · 3.0mm · 1.15mm/px · 1 of 32 slices shown (38 of 48)]
[im 1/32]
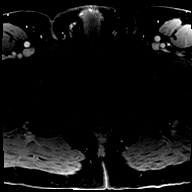

[Series 48: T1 · axial · 3.0mm · 1.15mm/px · 1 of 32 slices shown (39 of 48)]
[im 1/32]
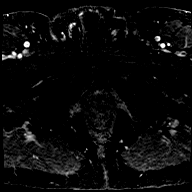

[Series 49: T1 · axial · 3.0mm · 1.15mm/px · 1 of 32 slices shown (40 of 48)]
[im 1/32]
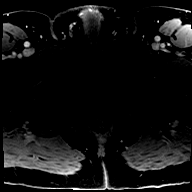

[Series 50: T1 · axial · 3.0mm · 1.15mm/px · 1 of 32 slices shown (41 of 48)]
[im 1/32]
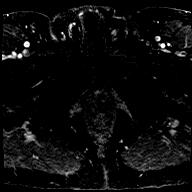

[Series 51: T1 · axial · 3.0mm · 1.15mm/px · 1 of 32 slices shown (42 of 48)]
[im 1/32]
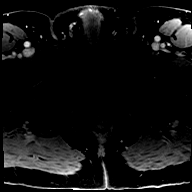

[Series 52: T1 · axial · 3.0mm · 1.15mm/px · 1 of 32 slices shown (43 of 48)]
[im 1/32]
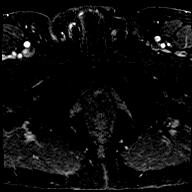

[Series 53: T1 · axial · 3.0mm · 1.15mm/px · 1 of 32 slices shown (44 of 48)]
[im 1/32]
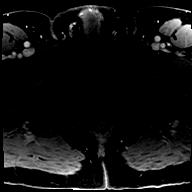

[Series 54: T1 · axial · 3.0mm · 1.15mm/px · 1 of 32 slices shown (45 of 48)]
[im 1/32]
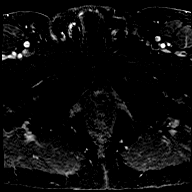

[Series 55: T1 · axial · 3.0mm · 1.15mm/px · 1 of 32 slices shown (46 of 48)]
[im 1/32]
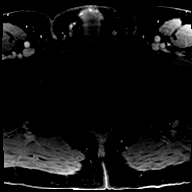

[Series 56: T1 · axial · 3.0mm · 1.15mm/px · 1 of 32 slices shown (47 of 48)]
[im 1/32]
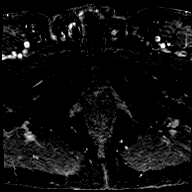

[Series 57: T1 · axial · 3.0mm · 1.15mm/px · 1 of 32 slices shown (48 of 48)]
[im 1/32]
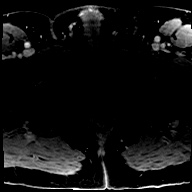

[56 of 56 positions shown; findings below may reference images not displayed]

FINDINGS: Prostate:

Transitional zone: BPH nodularity and heterogeneity without discrete
high-risk lesion in the transitional zone.

Peripheral zone: Linear and wedge-shaped areas of T2 hypointensity
without area on diffusion to indicate focal high-risk lesion in the
peripheral zone.

Volume: 53.7 cc

Transcapsular spread:  Absent

Seminal vesicle involvement: Absent

Neurovascular bundle involvement: Absent

Pelvic adenopathy: Absent

Bone metastasis: Absent

Other findings: 9 x 6 mm cystic area at the RIGHT UVJ. No distal
ureteral dilation (image [DATE])
IMPRESSION: 1. No signs of high-risk lesion, overall assessment PIRADS category
2
2. 9 x 6 mm cystic area at the RIGHT UVJ. No distal ureteral
dilation. Findings may reflect a small ureterocele though findings
are of uncertain significance. No distal ureteral dilation. Consider
direct visualization for further assessment as warranted.

## 2022-11-20 ENCOUNTER — Telehealth: Payer: Self-pay | Admitting: Cardiovascular Disease

## 2022-11-20 ENCOUNTER — Ambulatory Visit (INDEPENDENT_AMBULATORY_CARE_PROVIDER_SITE_OTHER): Payer: Medicare HMO

## 2022-11-20 ENCOUNTER — Ambulatory Visit: Payer: Medicare HMO | Admitting: Pharmacist

## 2022-11-20 DIAGNOSIS — Z95810 Presence of automatic (implantable) cardiac defibrillator: Secondary | ICD-10-CM

## 2022-11-20 DIAGNOSIS — I5022 Chronic systolic (congestive) heart failure: Secondary | ICD-10-CM

## 2022-11-20 DIAGNOSIS — I10 Essential (primary) hypertension: Secondary | ICD-10-CM

## 2022-11-20 DIAGNOSIS — E1136 Type 2 diabetes mellitus with diabetic cataract: Secondary | ICD-10-CM

## 2022-11-20 NOTE — Chronic Care Management (AMB) (Signed)
11/20/2022 Name: Russell [name]. MRN: [id number] DOB: Aug 25, 1951  Chief Complaint  Patient presents with   Medication Management   Medication Assistance    Russell [name]. is a 71 y.o. year old male who presented for a telephone visit.   They were referred to the pharmacist by their PCP for assistance in managing diabetes, medication access, and complex medication management.   Subjective:  Care Team: Primary Care Provider: Olin Hauser, DO ; Next Scheduled Visit: 04/19/2023 Cardiologist: Minna Merritts, MD; Next Scheduled Visit: 04/16/2023 Urology: Royston Cowper; Next Scheduled Visit: 11/21/2022  Medication Access/Adherence  Current Pharmacy:  [facility], [address] [place] [address] Phone: [phone] Fax: [place], [place] - [address] [place] [address] Phone: [phone] Fax: [phone]  [facility] - [place], [address] [place] [address] Phone: [phone] Fax: [phone]   Patient reports affordability concerns with their medications: Yes  Patient reports access/transportation concerns to their pharmacy: No  Patient reports adherence concerns with their medications:  No    Per referral from PCP/chart, Cardiology planned to start patient on Jardiance 10 mg daily, but copayment was unaffordable for patient - Based on reported income, patient does not meet criteria for Extra Help subsidy, but does meet criteria for patient assistance for Jardiance from [facility]  Diabetes:  Current medications: metformin ER 500 mg - 2 tablets daily  Recalls recent morning fasting readings ranging 130-148  Patient denies hypoglycemic s/sx including dizziness, shakiness, sweating.   Current meal patterns:  - Breakfast: oatmeal - Lunch: [place] sandwich -  Supper: beef or baked chicken and beans - Dessert: instant banana pudding or Jello - Snacks: nab crackers or peanuts - Drinks: water and 1 cup of coffee/day  Current physical activity: walks ~60 minutes x 2-3 days/week   Hypertension/Systolic CHF/Cardiomyopathy:  Patient followed by [employer]  Current medications:  - carvedilol 25 mg twice daily - doxazosin 1 mg twice daily - isosorbide ER 60 mg - 1.5 tablets (90 mg) twice daily - Entresto 97/103 twice daily - Reports has not taken spironolactone "in a while"  From review of dispensing history in chart, spironolactone last filled on 07/25/2021 for 90 day supply Per review of Cardiology note from 01/30/2022, provider wrote "Previously not taking spironolactone secondary to side effects.Marland KitchenMarland KitchenUncertain if he is on spironolactone"  Patient has an automated, upper arm home BP cuff Current blood pressure readings readings: yesterday: 133/77, HR 55  Reports monitors home weight; denies any signs of swelling  Current physical activity: walks ~60 minutes x 2-3 days/week  Current medication access support: Entresto patient assistance from [employer] through 12/24/2022 - paperwork completed by Cardiology   Objective: Lab Results  Component Value Date   HGBA1C 8.1 (H) 10/11/2022    Lab Results  Component Value Date   CREATININE 1.11 10/11/2022   BUN 14 10/11/2022   NA 142 10/11/2022   K 4.0 10/11/2022   CL 106 10/11/2022   CO2 29 10/11/2022    Lab Results  Component Value Date   CHOL 159 10/11/2022   HDL 73 10/11/2022   LDLCALC 70 10/11/2022   TRIG 79 10/11/2022   CHOLHDL 2.2 10/11/2022   BP Readings from Last 3 Encounters:  10/18/22 136/77  10/10/22 (!) 160/84  10/05/22 (!) 166/98   Pulse Readings  from Last 3 Encounters:  10/18/22 60  10/10/22 62  10/05/22 65     Medications Reviewed Today     Reviewed by Rennis Petty, RPH-CPP (Pharmacist) on 11/20/22 at 1050  Med List Status: <None>    Medication Order Taking? Sig Documenting Provider Last Dose Status Informant  amiodarone (PACERONE) 200 MG tablet [id number] Yes TAKE 1/2 TABLET EVERY DAY Allred, James, MD Taking Active   Blood Glucose Monitoring Suppl (ONE TOUCH ULTRA 2) w/Device KIT [id number]  Use to check blood sugar x 1 daily Olin Hauser, DO  Active   brimonidine [facility]) 0.2 % ophthalmic solution [id number] Yes Place 1 drop into both eyes 2 (two) times daily. [provider] Taking Active   carvedilol (COREG) 25 MG tablet [id number] Yes Take 1 tablet (25 mg total) by mouth 2 (two) times daily. Minna Merritts, MD Taking Active   Cholecalciferol (VITAMIN D3) 2000 units TABS [id number] Yes Take 1 capsule by mouth daily.  [provider] Taking Active Spouse/Significant Other  dorzolamide-timolol (COSOPT) 22.3-6.8 MG/ML ophthalmic solution [id number] Yes Place 1 drop into both eyes 2 (two) times daily. [provider] Taking Active   doxazosin (CARDURA) 1 MG tablet [id number] Yes Take 1 tablet (1 mg total) by mouth 2 (two) times daily. Minna Merritts, MD Taking Active   empagliflozin (JARDIANCE) 10 MG TABS tablet [id number]  Take 1 tablet (10 mg total) by mouth daily before breakfast. Minna Merritts, MD  Active   finasteride (PROSCAR) 5 MG tablet [id number] Yes Take 5 mg by mouth daily. [provider] Taking Active   isosorbide mononitrate (IMDUR) 60 MG 24 hr tablet [id number] Yes Take 1.5 tablets (90 mg total) by mouth 2 (two) times daily. Minna Merritts, MD Taking Active   latanoprost (XALATAN) 0.005 % ophthalmic solution [id number] Yes Place 1 drop into both eyes at bedtime.  [provider] Taking Active Spouse/Significant Other  metFORMIN (GLUCOPHAGE-XR) 500 MG 24 hr tablet [id number] Yes Take 2 tablets (1,000 mg total) by mouth daily with breakfast. Olin Hauser, DO Taking Active   nitroGLYCERIN (NITROSTAT) 0.4 MG SL tablet [id number]  DISSOLVE ONE  TABLET UNDER THE TONGUE EVERY 5 MINUTES AS NEEDED FOR CHEST PAIN.  DO NOT EXCEED A TOTAL OF 3 DOSES IN 15 MINUTES Gollan, Kathlene November, MD  Active   OneTouch Delica Lancets 19Q [place] [id number]  Use to check blood sugar up to 1 time daily Olin Hauser, DO  Active   [facility] ULTRA test strip [id number]  Check blood sugar 1 x daily Olin Hauser, DO  Active   polyvinyl alcohol (LIQUIFILM TEARS) 1.4 % ophthalmic solution [id number] No Place 2 drops into both eyes every 8 (eight) hours.  Patient not taking: Reported on 11/20/2022   Loletha Grayer, MD Not Taking Active   pravastatin (PRAVACHOL) 10 MG tablet [id number] Yes TAKE 1 TABLET AT BEDTIME Olin Hauser, DO Taking Active   sacubitril-valsartan (ENTRESTO) 97-103 MG [id number] Yes Take 1 tablet by mouth 2 (two) times daily. Theora Gianotti, NP Taking Active   spironolactone (ALDACTONE) 25 MG tablet [id number] No Take 1 tablet (25 mg total) by mouth daily.  Patient not taking: Reported on 11/20/2022   Minna Merritts, MD Not Taking Expired 10/10/22 2359   tamsulosin (FLOMAX) 0.4 MG CAPS capsule [id number] Yes Take 0.4 mg by mouth daily. [provider] Taking Active              Assessment/Plan:   Comprehensive medication review  performed; medication list updated in electronic medical record - Identify duplicate therapy - doxazosin + tamsulosin (both Alpha 1 Blockers). Note patient on tamsulosin 0.4 mg daily from Dr. Yves Dill at [facility]. However, from review of chart, note on 01/30/2022, Cardiologist started patient on doxazosin 1 mg twice daily related to blood pressure control. Patient reports sometimes lightheaded when gets up in the morning Outreach and speak with Speak with Dr. Yves Dill at [facility] today. Provider reports he will plan to have patient stop taking tamsulosin. Plans to discuss with patient at appointment tomorrow  Diabetes: - Currently uncontrolled - Reviewed  long term cardiovascular and renal outcomes of uncontrolled blood sugar - Reviewed goal A1c, goal fasting, and goal 2 hour post prandial glucose - Reviewed dietary modifications including strategies for limiting carbohydrate portion sizes and increasing intake of non-starchy vegetables - Counseled on SGLT2, including mechanism of action, side effects, and benefits. Discussed potential side effects of dehydration, genitourinary infections. Encouraged adequate hydration and genital hygiene. Advised on sick day rules (if a day with significantly reduced oral intake, serious vomiting, or diarrhea, hold SGLT2). Patient verbalized understanding.  - Recommend to check glucose, keep log of results and have record to review during future appointments - Meets financial criteria for Jardiance patient assistance program through [employer]. Will collaborate with provider, CPhT, and patient to pursue assistance.    Hypertension/Systolic CHF/Cardiomyopathy: - Reviewed long term cardiovascular and renal outcomes of uncontrolled blood pressure - Reviewed appropriate blood pressure monitoring technique and reviewed goal blood pressure. Recommended to check home blood pressure and heart rate, keep log of results and have record to review during future appointments - Collaborate with [facility] to confirm Cardiology aware patient not taking spironolactone. Leave message for provider - Encourage patient to take positional changes slowly    Follow Up Plan: Clinical Pharmacist to follow up with patient by telephone on 01/08/2023 at 10 am   Wallace Cullens, PharmD, [place], [facility] [place] [phone]

## 2022-11-20 NOTE — Patient Instructions (Signed)
Goals Addressed             This Visit's Progress    Pharmacy Goals       Our goal A1c is less than 7%. This corresponds with fasting sugars less than 130 and 2 hour after meal sugars less than 180. Please keep a log of your results when checking your blood sugar    Our blood pressure goal is less than 130/80.  To appropriately check your blood pressure, make sure you do the following:  1) Avoid caffeine, exercise, or tobacco products for 30 minutes before checking. Empty your bladder. 2) Sit with your back supported in a flat-backed chair. Rest your arm on something flat (arm of the chair, table, etc). 3) Sit still with your feet flat on the floor, resting, for at least 5 minutes.  4) Check your blood pressure. Take 1-2 readings.  5) Write down these readings and bring with you to any provider appointments.  Bring your home blood pressure machine with you to a provider's office for accuracy comparison at least once a year.   Make sure you take your blood pressure medications before you come to any office visit, even if you were asked to fast for labs.  Our goal bad cholesterol, or LDL, is less than 70 . This is why it is important to continue taking your pravastatin.  Please watch the mail for an envelope from [employer] containing the patient assistance program application. Please complete this application and mail back to [facility] Pharmacy Technician Noreene Larsson [place] along with a copy of your Medicare Part D prescription card and a copy of your proof of income document.  Thank you!   Estelle Grumbles, PharmD, [place], CPP Clinical Pharmacist [facility] [phone]

## 2022-11-20 NOTE — Telephone Encounter (Signed)
Attempted to reach [place] back to discuss the medication. Was unable to connect. Will try back again tomorrow.

## 2022-11-20 NOTE — Telephone Encounter (Signed)
Pt c/o medication issue:  1. Name of Medication:   spironolactone (ALDACTONE) 25 MG tablet    2. How are you currently taking this medication (dosage and times per day)? Take 1 tablet (25 mg total) by mouth daily   3. Are you having a reaction (difficulty breathing--STAT)? No  4. What is your medication issue? Pt's PCP called to make office aware that pt stated that he is not taking mediation

## 2022-11-21 DIAGNOSIS — N5201 Erectile dysfunction due to arterial insufficiency: Secondary | ICD-10-CM | POA: Diagnosis not present

## 2022-11-21 DIAGNOSIS — N401 Enlarged prostate with lower urinary tract symptoms: Secondary | ICD-10-CM | POA: Diagnosis not present

## 2022-11-21 DIAGNOSIS — Z79899 Other long term (current) drug therapy: Secondary | ICD-10-CM | POA: Diagnosis not present

## 2022-11-21 DIAGNOSIS — R972 Elevated prostate specific antigen [PSA]: Secondary | ICD-10-CM | POA: Diagnosis not present

## 2022-11-21 MED ORDER — SPIRONOLACTONE 25 MG PO TABS: 25.0000 mg | ORAL_TABLET | Freq: Every day | ORAL | 3 refills | Status: DC

## 2022-11-21 NOTE — Progress Notes (Signed)
Encounter details: CCM Time Spent       Value Time User   Total time (minutes)  0 11/20/2022 10:22 AM Delles, Jackelyn Poling, RPH-CPP       As the supervising physician/advanced practice provider, I certify that I have collaborated with the care management team and personally reviewed and directed the care plan for this patient's chronic conditions.         Saralyn Pilar, DO [facility] 11/21/2022, 12:52 PM

## 2022-11-21 NOTE — Telephone Encounter (Signed)
To Dr. Mariah Milling- it looks like his PCP office is reporting he stopped taking aldactone.  I have not confirmed with the patient if he just ran out of refills, but prior to calling him, wanted to make sure that you still want him on aldactone 25 mg once daily.

## 2022-11-21 NOTE — Telephone Encounter (Signed)
Pt updated with MD's recommendations and voiced he hasn't been taking medication due to no refills. Nurse sent new prescription to the requested pharmacy.   Antonieta Iba, MD  [facility] [place] hour ago (1:10 PM)    Would recommend he stay on the spironolactone 25 mg daily given his cardiomyopathy Important part of his regimen Thx Tgollan

## 2022-11-21 NOTE — Telephone Encounter (Signed)
Fax received from [employer] stating the patient has been approved and eligible to receive Entresto until 12/25/23 at no cost.   Per fax-   If medication is patient administered: Instructions have been mailed to the patient. To support the patient, please provide the following instructions: Call [phone] 1st order- Hold on the line for the next available [employer] representative Refill & Shipping Status- Instructions are the same as above  I called and spoke with the patient to advise him of his approval. I have also advised him he will need to cal the 800 # for his 1st shipment.  The patient was in a store and could not take the information down, but advised I could send this to his MyChart.   He was very appreciative of the call.

## 2022-11-22 ENCOUNTER — Other Ambulatory Visit: Payer: Self-pay

## 2022-11-22 MED ORDER — NITROGLYCERIN 0.4 MG SL SUBL: SUBLINGUAL_TABLET | SUBLINGUAL | 0 refills | Status: DC

## 2022-11-23 ENCOUNTER — Telehealth: Payer: Self-pay | Admitting: Pharmacy Technician

## 2022-11-23 DIAGNOSIS — Z596 Low income: Secondary | ICD-10-CM

## 2022-11-23 NOTE — Progress Notes (Signed)
[facility] Customer service manager [facility])                                            [facility] Quality Pharmacy Team    11/23/2022  Milbert Bixler. 10/22/1951 [id number]                                      Medication Assistance Referral  Referral From: [facility] Embedded RPh Vallery Sa   Medication/Company: [employer] / [employer] Patient application portion:  Mailed Provider application portion: Faxed  to Dr. Saralyn Pilar Provider address/fax verified via: Office website     Arvie Bartholomew P. Lucca Ballo, CPhT [facility] [employer]  [phone]

## 2022-11-24 ENCOUNTER — Other Ambulatory Visit: Payer: Self-pay | Admitting: Cardiovascular Disease

## 2022-11-24 DIAGNOSIS — Z0100 Encounter for examination of eyes and vision without abnormal findings: Secondary | ICD-10-CM | POA: Diagnosis not present

## 2022-11-24 NOTE — Progress Notes (Signed)
EPIC Encounter for ICM Monitoring  Patient Name: Russell [name]. is a 71 y.o. male Date: 11/24/2022 Primary Care Physican: Smitty Cords, DO Primary Cardiologist: Mariah Milling Electrophysiologist: Lalla Brothers 08/10/2022 Weight: 164 lbs 09/15/2022 Weight: 164-165 lbs 11/24/2022 Weight: 165 lbs                                                          Spoke with patient and heart failure questions reviewed.  Transmission results reviewed.  Pt asymptomatic for fluid accumulation.  Reports feeling well at this time and voices no complaints.     Optivol thoracic impedance suggesting normal fluid levels.   Prescribed: Spironolactone 25 mg take 1 tablet daily   Labs: 10/11/2022 Creatinine 1.11, BUN 14, Potassium 4.0, Sodium 142, GFR 72 10/04/2022 Creatinine 1.15, BUN 15, Potassium 4.3, Sodium 141  04/05/2022 Creatinine 1.23, BUN 13, Potassium 4.6, Sodium 139, GFR 64 A complete set of results can be found in Results Review.   Recommendations:  No changes and encouraged to call if experiencing any fluid symptoms.   Follow-up plan: ICM clinic phone appointment on 01/01/2023.   91 day device clinic remote transmission 12/06/2023.    EP/Cardiology Next Office Visit:  04/16/2023 with Dr Mariah Milling.  Recall 04/02/2023 with Dr Lalla Brothers.     Copy of ICM check sent to Dr. Lalla Brothers.  3 month ICM trend: 11/20/2022.    12-14 Month ICM trend:     [name], RN 11/24/2022 10:37 AM

## 2022-12-19 NOTE — Progress Notes (Signed)
Remote ICD transmission.   

## 2022-12-31 ENCOUNTER — Inpatient Hospital Stay
Admit: 2022-12-31 | Discharge: 2023-01-10 | Payer: MEDICARE | Attending: Internal Medicine | Admitting: Internal Medicine

## 2022-12-31 ENCOUNTER — Emergency Department: Admit: 2022-12-31 | Payer: MEDICARE | Primary: Internal Medicine

## 2022-12-31 LAB — URINALYSIS WITH CULTURE REFLEX      (BH LMW YH)
BKR BILIRUBIN, UA: NEGATIVE
BKR GLUCOSE, UA: NEGATIVE
BKR LEUKOCYTE ESTERASE, UA: NEGATIVE
BKR NITRITE, UA: NEGATIVE
BKR PH, UA: 6.5 (ref 5.5–7.5)
BKR SPECIFIC GRAVITY, UA: 1.01 (ref 1.005–1.030)
BKR UROBILINOGEN, UA (MG/DL): <2 mg/dL (ref <=2.0)

## 2022-12-31 LAB — BASIC METABOLIC PANEL
BKR ANION GAP: 19 — ABNORMAL HIGH (ref 5–16)
BKR BLOOD UREA NITROGEN: 51 mg/dL — ABNORMAL HIGH (ref 7–18)
BKR BUN / CREAT RATIO: 12.6 (ref 8.0–23.0)
BKR CALCIUM: 8 mg/dL — ABNORMAL LOW (ref 8.5–10.5)
BKR CHLORIDE: 91 mmol/L — ABNORMAL LOW (ref 98–107)
BKR CO2: 16 mmol/L — ABNORMAL LOW (ref 21–32)
BKR CREATININE: 4.06 mg/dL — ABNORMAL HIGH (ref 0.80–1.30)
BKR EGFR, CREATININE (CKD-EPI 2021): 15 mL/min/{1.73_m2} — ABNORMAL LOW (ref >=60)
BKR GLUCOSE: 96 mg/dL (ref 70–100)
BKR POTASSIUM: 3.6 mmol/L (ref 3.5–5.1)
BKR SODIUM: 126 mmol/L — ABNORMAL LOW (ref 136–145)

## 2022-12-31 LAB — TROPONIN T HIGH SENSITIVITY, 3 HOUR (BH GH LMW YH)
BKR TROPONIN T HS 1 HOUR DELTA FROM 0 HOUR ON 3HR: 1 ng/L
BKR TROPONIN T HS 3 HOUR DELTA FROM 0 HOUR: 3 ng/L
BKR TROPONIN T HS 3 HOUR: 37 ng/L — ABNORMAL HIGH

## 2022-12-31 LAB — CBC WITH AUTO DIFFERENTIAL
BKR WAM ABSOLUTE IMMATURE GRANULOCYTES.: 0.07 x 1000/ÂµL (ref 0.00–0.30)
BKR WAM ABSOLUTE LYMPHOCYTE COUNT.: 0.79 x 1000/ÂµL (ref 0.60–3.70)
BKR WAM ABSOLUTE NRBC (2 DEC): 0 x 1000/ÂµL (ref 0.00–1.00)
BKR WAM ANALYZER ANC: 10.01 x 1000/ÂµL — ABNORMAL HIGH (ref 2.00–7.60)
BKR WAM BASOPHIL ABSOLUTE COUNT.: 0.03 x 1000/ÂµL (ref 0.00–1.00)
BKR WAM BASOPHILS: 0.3 % (ref 0.0–1.4)
BKR WAM EOSINOPHIL ABSOLUTE COUNT.: 0.01 x 1000/ÂµL (ref 0.00–1.00)
BKR WAM EOSINOPHILS: 0.1 % (ref 0.0–5.0)
BKR WAM HEMATOCRIT (2 DEC): 38.5 % (ref 38.50–50.00)
BKR WAM HEMOGLOBIN: 13.2 g/dL (ref 13.2–17.1)
BKR WAM IMMATURE GRANULOCYTES: 0.6 % (ref 0.0–1.0)
BKR WAM LYMPHOCYTES: 6.7 % — ABNORMAL LOW (ref 17.0–50.0)
BKR WAM MCH (PG): 30.7 pg (ref 27.0–33.0)
BKR WAM MCHC: 34.3 g/dL (ref 31.0–36.0)
BKR WAM MCV: 89.5 fL (ref 80.0–100.0)
BKR WAM MONOCYTE ABSOLUTE COUNT.: 0.85 x 1000/ÂµL (ref 0.00–1.00)
BKR WAM MONOCYTES: 7.2 % (ref 4.0–12.0)
BKR WAM MPV: 11.7 fL (ref 8.0–12.0)
BKR WAM NEUTROPHILS: 85.1 % — ABNORMAL HIGH (ref 39.0–72.0)
BKR WAM NUCLEATED RED BLOOD CELLS: 0 % (ref 0.0–1.0)
BKR WAM PLATELETS: 113 x1000/ÂµL — ABNORMAL LOW (ref 150–420)
BKR WAM RDW-CV: 12.3 % (ref 11.0–15.0)
BKR WAM RED BLOOD CELL COUNT.: 4.3 M/ÂµL (ref 4.00–6.00)
BKR WAM WHITE BLOOD CELL COUNT: 11.8 x1000/ÂµL — ABNORMAL HIGH (ref 4.0–11.0)

## 2022-12-31 LAB — INFLUENZA A+B/RSV BY RT-PCR
BKR INFLUENZA A: NEGATIVE
BKR INFLUENZA B: NEGATIVE
BKR RESPIRATORY SYNCYTIAL VIRUS: NEGATIVE

## 2022-12-31 LAB — LACTIC ACID, WHOLE BLOOD, VENOUS (REFLEX 2H REPEAT) (MC)
BKR LACTIC ACID, BLOOD GAS VENOUS (MC): 1.3 mmol/L (ref 0.6–1.4)
BKR LACTIC ACID, BLOOD GAS VENOUS (MC): 0.8 mmol/L (ref 0.6–1.4)

## 2022-12-31 LAB — COMPREHENSIVE METABOLIC PANEL
BKR A/G RATIO: 0.6 — ABNORMAL LOW (ref 1.0–2.2)
BKR ALANINE AMINOTRANSFERASE (ALT): 33 U/L (ref 14–63)
BKR ALBUMIN: 2.7 g/dL — ABNORMAL LOW (ref 3.4–5.0)
BKR ALKALINE PHOSPHATASE: 80 U/L (ref 46–116)
BKR ANION GAP: 17 — ABNORMAL HIGH (ref 5–16)
BKR ASPARTATE AMINOTRANSFERASE (AST): 42 U/L (ref 10–42)
BKR AST/ALT RATIO: 1.3
BKR BILIRUBIN TOTAL: 0.9 mg/dL (ref 0.2–1.2)
BKR BLOOD UREA NITROGEN: 48 mg/dL — ABNORMAL HIGH (ref 7–18)
BKR BUN / CREAT RATIO: 11.3 (ref 8.0–23.0)
BKR CALCIUM: 8.1 mg/dL — ABNORMAL LOW (ref 8.5–10.5)
BKR CHLORIDE: 93 mmol/L — ABNORMAL LOW (ref 98–107)
BKR CO2: 19 mmol/L — ABNORMAL LOW (ref 21–32)
BKR CREATININE: 4.25 mg/dL — ABNORMAL HIGH (ref 0.80–1.30)
BKR EGFR, CREATININE (CKD-EPI 2021): 14 mL/min/{1.73_m2} — ABNORMAL LOW (ref >=60)
BKR GLOBULIN: 4.7 g/dL — ABNORMAL HIGH (ref 2.3–3.5)
BKR GLUCOSE: 99 mg/dL (ref 70–100)
BKR POTASSIUM: 3.9 mmol/L (ref 3.5–5.1)
BKR PROTEIN TOTAL: 7.4 g/dL (ref 6.0–8.0)
BKR SODIUM: 129 mmol/L — ABNORMAL LOW (ref 136–145)

## 2022-12-31 LAB — TROPONIN T HIGH SENSITIVITY, 1 HOUR WITH REFLEX (BH GH LMW YH)
BKR TROPONIN T HS 1 HOUR DELTA FROM 0 HOUR: 1 ng/L
BKR TROPONIN T HS 1 HOUR: 35 ng/L — ABNORMAL HIGH

## 2022-12-31 LAB — URINE MICROSCOPIC     (BH GH LMW YH)

## 2022-12-31 LAB — MAGNESIUM
BKR MAGNESIUM: 1.9 mg/dL (ref 1.8–2.5)
BKR WAM LYMPHOCYTES: 1.9 mg/dL (ref 1.8–2.5)

## 2022-12-31 LAB — TSH W/REFLEX TO FT4     (BH GH LMW Q YH): BKR THYROID STIMULATING HORMONE: 1.48 u[IU]/mL

## 2022-12-31 LAB — SARS COV-2 (COVID-19) RNA: BKR SARS-COV-2 RNA (COVID-19) (YH): NEGATIVE

## 2022-12-31 LAB — TROPONIN T HIGH SENSITIVITY, 0 HOUR BASELINE WITH REFLEX (BH GH LMW YH): BKR TROPONIN T HS 0 HOUR BASELINE: 34 ng/L — ABNORMAL HIGH

## 2022-12-31 MED ORDER — VANCOMYCIN MAR LEVEL
Freq: Once | INTRAVENOUS | Status: CP
Start: 2022-12-31 — End: ?

## 2022-12-31 MED ORDER — VANCOMYCIN THERAPY PLACEHOLDER
INTRAVENOUS | Status: AC
Start: 2022-12-31 — End: ?

## 2022-12-31 MED ORDER — SODIUM CHLORIDE 0.9 % (FLUSH) INJECTION SYRINGE
0.9 % | Freq: Three times a day (TID) | INTRAVENOUS | Status: DC
Start: 2022-12-31 — End: 2023-01-11
  Administered 2023-01-01 – 2023-01-10 (×21): 0.9 mL via INTRAVENOUS

## 2022-12-31 MED ORDER — AMPICILLIN-SULBACTAM (UNASYN) 1.5GM MBP
Freq: Three times a day (TID) | INTRAVENOUS | Status: CP
Start: 2022-12-31 — End: ?
  Administered 2023-01-01 – 2023-01-07 (×19): 50.000 mL/h via INTRAVENOUS

## 2022-12-31 MED ORDER — TORSEMIDE 20 MG TABLET
20 mg | Freq: Once | ORAL | Status: CP
Start: 2022-12-31 — End: ?
  Administered 2022-12-31: 17:00:00 20 mg via ORAL

## 2022-12-31 MED ORDER — METOPROLOL SUCCINATE ER 25 MG TABLET,EXTENDED RELEASE 24 HR
25 mg | Freq: Every day | ORAL | Status: DC
Start: 2022-12-31 — End: 2023-01-11
  Administered 2023-01-01 – 2023-01-10 (×10): 25 mg via ORAL

## 2022-12-31 MED ORDER — HYDRALAZINE 25 MG TABLET
25 mg | Freq: Three times a day (TID) | ORAL | Status: DC
Start: 2022-12-31 — End: 2023-01-07
  Administered 2023-01-01 – 2023-01-07 (×21): 25 mg via ORAL

## 2022-12-31 MED ORDER — AMPICILLIN-SULBACTAM (UNASYN) 1.5GM MBP
Freq: Once | INTRAVENOUS | Status: CP
Start: 2022-12-31 — End: ?
  Administered 2022-12-31: 20:00:00 50.000 mL/h via INTRAVENOUS

## 2022-12-31 MED ORDER — HYDRALAZINE 50 MG TABLET
50 mg | Freq: Once | ORAL | Status: CP
Start: 2022-12-31 — End: ?
  Administered 2022-12-31: 16:00:00 50 mg via ORAL

## 2022-12-31 MED ORDER — HEPARIN (PORCINE) 5,000 UNIT/ML INJECTION SOLUTION
5000 unit/mL | Freq: Three times a day (TID) | SUBCUTANEOUS | Status: DC
Start: 2022-12-31 — End: 2023-01-11
  Administered 2023-01-01 – 2023-01-10 (×30): via SUBCUTANEOUS

## 2022-12-31 MED ORDER — SODIUM CHLORIDE 0.9 % BOLUS (NEW BAG)
0.9 % | Freq: Once | INTRAVENOUS | Status: CP
Start: 2022-12-31 — End: ?
  Administered 2022-12-31: 23:00:00 0.9 mL/h via INTRAVENOUS

## 2022-12-31 MED ORDER — SODIUM CHLORIDE 0.9 % (FLUSH) INJECTION SYRINGE
0.9 % | INTRAVENOUS | Status: DC | PRN
Start: 2022-12-31 — End: 2023-01-11
  Administered 2023-01-04: 23:00:00 0.9 mL via INTRAVENOUS

## 2022-12-31 MED ORDER — ONDANSETRON HCL (PF) 4 MG/2 ML INJECTION SOLUTION
42 mg/2 mL | Freq: Four times a day (QID) | INTRAVENOUS | Status: DC | PRN
Start: 2022-12-31 — End: 2023-01-11

## 2022-12-31 MED ORDER — MORPHINE 2 MG/ML INJECTION SYRINGE
2 mg/mL | SUBCUTANEOUS | Status: CP | PRN
Start: 2022-12-31 — End: ?
  Administered 2023-01-01 – 2023-01-02 (×3): 2 mL via SUBCUTANEOUS

## 2022-12-31 MED ORDER — VANCOMYCIN IVPB (2 G IN 500ML NS)
Freq: Once | INTRAVENOUS | Status: CP
Start: 2022-12-31 — End: ?
  Administered 2022-12-31: 20:00:00 500.000 mL/h via INTRAVENOUS

## 2022-12-31 MED ORDER — SODIUM CHLORIDE 0.9 % INTRAVENOUS SOLUTION
INTRAVENOUS | Status: DC
Start: 2022-12-31 — End: 2022-12-31

## 2022-12-31 MED ORDER — ONDANSETRON 4 MG DISINTEGRATING TABLET
4 mg | Freq: Four times a day (QID) | ORAL | Status: DC | PRN
Start: 2022-12-31 — End: 2023-01-11

## 2022-12-31 MED ORDER — AMLODIPINE 10 MG TABLET
10 mg | Freq: Every day | ORAL | Status: DC
Start: 2022-12-31 — End: 2023-01-11
  Administered 2023-01-01 – 2023-01-10 (×10): 10 mg via ORAL

## 2022-12-31 MED ORDER — SODIUM CHLORIDE 0.9 % INTRAVENOUS SOLUTION
INTRAVENOUS | Status: DC
Start: 2022-12-31 — End: 2023-01-03
  Administered 2023-01-01 – 2023-01-03 (×4): via INTRAVENOUS

## 2022-12-31 MED ORDER — ROSUVASTATIN 5 MG TABLET
5 mg | Freq: Every day | ORAL | Status: DC
Start: 2022-12-31 — End: 2023-01-11
  Administered 2023-01-01 – 2023-01-10 (×10): 5 mg via ORAL

## 2022-12-31 MED ORDER — MORPHINE 4 MG/ML INTRAVENOUS SOLUTION
4 mg/mL | Freq: Once | INTRAVENOUS | Status: CP
Start: 2022-12-31 — End: ?
  Administered 2022-12-31: 20:00:00 4 mL via INTRAVENOUS

## 2022-12-31 MED ORDER — CARVEDILOL IMMEDIATE RELEASE 12.5 MG TABLET
12.5 mg | Freq: Once | ORAL | Status: CP
Start: 2022-12-31 — End: ?
  Administered 2022-12-31: 16:00:00 12.5 mg via ORAL

## 2022-12-31 MED ORDER — ACETAMINOPHEN 325 MG TABLET
325 mg | Freq: Four times a day (QID) | ORAL | Status: DC | PRN
Start: 2022-12-31 — End: 2023-01-11
  Administered 2023-01-02 – 2023-01-10 (×4): 325 mg via ORAL

## 2022-12-31 NOTE — Significant Event
Patient received torsemide in the morning.  Sodium now downtrending 129 initially now in 126.  Patient will be started on IV fluids and repeat sodium in 4 hours.

## 2022-12-31 NOTE — H&P
[place] CAMPUSHISTORY AND PHYSICAL Patient Name: Antwonne Saelee of Birth: 08-01-1952MRN: [id number] SUBJECTIVE Chief Complaint:  Laurence Slate of Present Illness: Antione Behrns is a 72 y.o. male hypertension hyperlipidemia and what appears to be CKD but he does not know this and does not have a nephrologist comes to the ED complaining of severe weakness for the past 3 days.  He has been forgetting to take his medication since then because he says he is weak.  He had 1 episode of diarrhea couple days ago.  No recent antibiotic usage.  He has a dry cough and no wheezing.  Sometimes short of breath.  P.o. intake is decreased.  He felt chills at home.  He did not remember a fever.  His legs are always swollen and he does not know if they are more swollen today.  He says he has been urinating a lot.  No hematuria and no dysuria though.In the ED he was hypertensive 202/139, improving 133/80.  Chest x-ray was negative [place] head C-spine negative, [place] chest abdomen pelvis showed chronic large airway disease, Dopplers were negative, sodium 129 creatinine 4.25 baseline around 2-3, troponin 34, 35, lactate negative, white count 12, platelets 113.  UA was negative.  He received Unasyn vanco Coreg hydralazine morphine and torsemide.Of note, while walking in the ED he experienced a fall in the parking lot and helped into a wheelchair by security.  Denies hitting his head or losing consciousness.Past Medical History: Diagnosis Date ? High blood pressure  ? Hypertension   Past Surgical History: Procedure Laterality Date ? TONSILLECTOMY    as a child ? 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  Social History Tobacco Use ? Smoking status: Former   Current packs/day: 0.00   Types: Cigarettes   Quit date: 2000   Years since quitting: 24.0 ? Smokeless tobacco: Not on file Substance Use Topics ? Alcohol use: No  Family History Problem Relation Age of Onset ? Arrhythmia Mother  ? Heart failure Father  ? Heart attack Father  ? No Known Problems Sister  ? No Known Problems Brother   No Known AllergiesPrior to Admission Medications   Medication Sig Last Dose Dispense Doc. Provider  carvediloL (COREG) 12.5 mg Immediate Release tablet TAKE ONE TABLET BY MOUTH EVERY MORNING THEN 1 TABLET IN THE EVENING. TAKE WITH MEALS -- -- Provider, Historical  hydrALAZINE (APRESOLINE) 50 mg tablet Take 1 tablet (50 mg total) by mouth 3 (three) times daily. -- -- Provider, Historical  torsemide (DEMADEX) 20 mg tablet Take 1 tablet (20 mg total) by mouth daily. -- -- Provider, Historical  REVIEW OF SYSTEMS Review of Systems Constitutional: Positive for chills. Respiratory: Positive for cough and shortness of breath.  Genitourinary: Positive for frequency. Skin: Positive for rash. Neurological: Positive for weakness. All other systems reviewed and are negative.INPATIENT MEDICATIONS Current Facility-Administered Medications Medication Dose Route Frequency Provider Last Rate Last Admin ? vancomycin (VANCOCIN) 2 g in sodium chloride 0.9% 500 mL IVPB  2 g Intravenous Once Halina Andreas, MD 250 mL/hr at 12/31/22 1505 2 g at 12/31/22 1505 ? [START ON 01/01/2023] Vancomycin MAR Level   Intravenous Once Halina Andreas, MD     ? Vancomycin Therapy Placeholder   Intravenous placeholder Halina Andreas, MD     PRN Meds:OBJECTIVE Vital signs in last 24 hours:Temp:  [97.2 ?F (36.2 ?C)-98.6 ?F (37 ?C)] 97.9 ?F (36.6 ?C)Pulse:  [85-103] 85Resp:  [17-51] 17BP: (130-202)/(71-139) 133/80SpO2:  [95 %-98 %] 95 %I/O's:Gross Totals (Last 24 hours) at 12/31/2022 1557Last data filed  at 12/31/2022 1506Intake 50 ml Output -- Net 50 ml APPEARANCE:  Ill-appearing male in bed looks sick and moderate distress alert and oriented x3HEENT: normocephalic, atraumatic. PERRLA, anicteric. Oropharynx mucosa dry without swelling, erythema, or exudate.NECK: Supple. No evidence of thyromegaly. No JVD. No Bruits.CHEST: Normal AP diameter. Lungs with equal air entry and CTA B/L. CARDIOVASCULAR: RRR, S1/S2,  no S3/S4,  no M/G/R. ABDOMEN: Abdomen soft, non-tender, non-distended with no masses, hepatosplenomegaly or hernias.  No CVA tenderness. Normoactive bowel sounds. EXTREMITIES: ppp, significant lymphedema bilateral lower extremities left greater than right and left lower extremity cellulitic changes with erythema, weeping warmth and tenderness to palpationLYMPHATICS: Cervical, supraclavicular, axillary, and inguinal nodes without adenopathy.MUSCULOSKELETAL: Upper and lower extremities with full ROM and equal muscle strength and tone of 5/5. SKIN:  Left lower extremity cellulitic changes see aboveNEUROLOGIC: Cranial nerves II-XII grossly intact.  Normal sensation to light and dull touch. No focal deficits.RECENT LABSRecent Labs   01/07/241137 WBC 11.8* HGB 13.2 HCT 38.50 PLT 113* MCV 89.5 NEUTROPHILS 85.1*  Recent Labs   01/07/241120 NA 129* K 3.9 CL 93* CO2 19* BUN 48* CREATININE 4.25* GLU 99 ANIONGAP 17* CALCIUM 8.1* MG 1.9  Recent Labs   01/07/241120 AST 42 ALT 33 ALKPHOS 80 ALBUMIN 2.7* PROT 7.4 BILITOT 0.9  No results for input(s): LABPROT, INR, PTT, DDIMER in the last 72 hours. No results for input(s): TROPONINI in the last 72 hours.Invalid input(s): PROBNP Recent Labs   01/07/241137 TSH 1.480  Recent Labs Lab 01/07/241336 SPECGRAV 1.010 PHUR 6.5 LEUKOCYTESUR Negative NITRITE Negative PROTEINUA 2+* GLUCOSEU Negative KETONESU 1+* BILIRUBINUR Negative UROBILINOGEN <2.0 IMAGINGCT ED Chest Abdomen Pelvis wo IV ContrastResult Date: 12/31/2022 Evidence of chronic large airways disease/aspiration including endotracheal debris, linear bibasilar scarring, and diffuse bronchial wall thickening. No evidence of acute abnormality within the chest, abdomen, or pelvis. [place] [facility] Notify System Classification: Routine. Report initiated by:  Vincenza Hews, MD Reported and signed by: Alberteen Sam, MD  [facility] Radiology and Biomedical Imaging US Duplex Lower Extremity Venous BilatResult Date: 12/31/2022 No evidence of deep venous thrombosis in bilateral femoral/popliteal veins, within above limitations. Please note that ultrasound is less sensitive for evaluation of thrombus in calf veins. If there is continued clinical concern, re-evaluation can be performed after 5-7 days to evaluate for propagation of an unseen thrombus from the calf. [place] [facility] Notify System Classification: Routine. Report initiated by:  Vincenza Hews, MD Reported and signed by: Alberteen Sam, MD  [facility] Radiology and Biomedical Imaging [place] Head Cervical Spine wo IV ContrastResult Date: 1/7/20241. No evidence of acute intracranial abnormality.  2. No evidence for acute cervical spine fracture or traumatic subluxation. Please note that Noncontrast Head [facility] is not sensitive for the detection of ischemic infarct. If ischemic infarct is of clinical concern, additional clinical or imaging evaluation is recommended. [place] [facility] Notify System Classification: Routine. Report initiated by:  Vincenza Hews, MD Reported and signed by: Lavera Guise, MD  [facility] Radiology and Biomedical Imaging CXRResult Date: 12/31/2022 No acute cardiothoracic abnormality. [place] [facility] Notify System Classification: Routine. Reported and signed by: Alberteen Sam, MD  [facility] Radiology and Biomedical Imaging [facility] results found for this or any previous visit.EKGResults for orders placed or performed during the hospital encounter of 12/31/22 EKG Result Value Ref Range  Heart Rate 93 bpm  QRS Interval 97 ms  QT Interval 387 ms  QTC Interval 482 ms  P Axis 40 deg  QRS Axis 12 deg  T Wave Axis 60 deg  P-R Interval 142 msec  SEVERITY Abnormal  ECG severity MICROBIOLOGY Blood CulturesNo results for input(s): LABBLOO in the last 168 hours. Urine CulturesNo results for input(s): LABURIN in the last 168 hours.Respiratory Cultures No results for input(s): LOWERRESPIRA in the last 168 hours. ASSESSMENT AND PLAN 72 y.o. male admitted with the following active issues:Impression: Cellulitis AKI on CKD Uncontrolled hypertensionStatus post fallPlan: Cellulitis: UnasynVancomycinDopplers negative Pain controlFollow culturesID consultmrsa swabAKI on CKD: Patient was given torsemide in the ED presumably for lower extremity swelling.   His legs are swollen likely due to lymphedema and his albumin is low. Intravascularly the patient looks depleted at this time.  His BNP was sent which will likely be high because his kidney function is high.  I will repeat metabolic panel and see his kidney function at this time but I will give IV fluids for now since there has no overload on chest x-ray and he was saturating well and looks dry.  Additionally he is on vancomycin s and I am trying to preserve his kidney function as best as possible. Hold losartanNephrologyUrine lytesRenal ultrasoundIntake outputUncontrolled hypertension:  Likely due because patient has not taken medications in the last 4 or 5 days due to weakness Status post Coreg hydralazine and torsemide in the ED Restart home meds and monitor vitals closely Trend troponin and EKG Repeat echoTeleStatus post fall: [place] head and C-spine negative [place] chest abdomen pelvis shows large chronic airway disease but otherwise negativeDiet: renal VTE Prophylaxis: Phamocologic thromboprophylaxis initiatedCode Status: FULL CODE/ACLS PROTOCOL.Signed:Earlean Fidalgo, MDDepartment of Medicine1/06/2023

## 2022-12-31 NOTE — ED Notes
At arrival patient fell to the ground in the ED parking lot due to weakness. Was helped into the wheelchair by security staff. This Clinical research associate assisted patient when notified. Patient denies headstrike. Patient reports driving to ED.

## 2022-12-31 NOTE — Progress Notes
VANCOMYCIN CONSULT Loading Dose: 2 g IVCurrent Vancomycin Order: 1.5 g IV per laboratory/placeholder data.  Vancomycin Indication: Suspected MRSA infection, bilateral extremity blisters, cellulitis Renal Function: AKIScr: Creatinine (mg/dL) Date Value 16/09/9603 4.25 (H) 04/10/2022 3.09 (H) 03/24/2022 2.70 (H)  CrCl: Estimated Creatinine Clearance: 17 mL/min (A) (by C-G formula based on SCr of 4.25 mg/dL (H)).Day of Therapy: day 1 Planned Duration of Therapy: to be determined Trough Goal: 10-15Vancomycin level: indicated based on the following: Patient weight > 100 kg or < 50 kgVancomycin Level Scheduled:  am 01/01/23 ID/AST Consulted? No YN[facility]/LM[facility]/WH Vancomycin Dosing GuidelineBH Vancomycin Dosing GuidelineFor questions, please contact the pharmacist: Clyde Canterbury, [facility]         Phone/Mobile Heartbeat: [phone]

## 2022-12-31 NOTE — Utilization Review (ED)
UM Status: UR/MD agree oLucretia Roers IP status Dr Wood . Multiple issues such as worsening renal function ( 2.7---->4.2) , bilateral legs cellulitis. IV antibiotic noted . Corky Sox, RN, MSN, ACMUtilization Review RN , [employer]

## 2023-01-01 ENCOUNTER — Inpatient Hospital Stay: Admit: 2023-01-01 | Payer: MEDICARE

## 2023-01-01 ENCOUNTER — Ambulatory Visit (INDEPENDENT_AMBULATORY_CARE_PROVIDER_SITE_OTHER): Payer: Medicare HMO

## 2023-01-01 DIAGNOSIS — N179 Acute kidney failure, unspecified: Secondary | ICD-10-CM

## 2023-01-01 DIAGNOSIS — Z95810 Presence of automatic (implantable) cardiac defibrillator: Secondary | ICD-10-CM

## 2023-01-01 DIAGNOSIS — I5022 Chronic systolic (congestive) heart failure: Secondary | ICD-10-CM

## 2023-01-01 LAB — VANCOMYCIN, RANDOM     (BH GH LMW YH): BKR VANCOMYCIN RANDOM: 17.4 ug/mL

## 2023-01-01 LAB — CBC WITH AUTO DIFFERENTIAL
BKR WAM ABSOLUTE IMMATURE GRANULOCYTES.: 0.14 x 1000/ÂµL (ref 0.00–0.30)
BKR WAM ABSOLUTE IMMATURE GRANULOCYTES.: 0.27 x 1000/ÂµL (ref 0.00–0.30)
BKR WAM ABSOLUTE LYMPHOCYTE COUNT.: 0.67 x 1000/ÂµL (ref 0.60–3.70)
BKR WAM ABSOLUTE LYMPHOCYTE COUNT.: 0.55 x 1000/ÂµL — ABNORMAL LOW (ref 0.60–3.70)
BKR WAM ABSOLUTE NRBC (2 DEC): 0 x 1000/ÂµL (ref 0.00–1.00)
BKR WAM ABSOLUTE NRBC (2 DEC): 0 x 1000/ÂµL (ref 0.00–1.00)
BKR WAM ANALYZER ANC: 8.22 x 1000/ÂµL — ABNORMAL HIGH (ref 2.00–7.60)
BKR WAM ANALYZER ANC: 7.13 x 1000/ÂµL (ref 2.00–7.60)
BKR WAM BASOPHIL ABSOLUTE COUNT.: 0.02 x 1000/ÂµL (ref 0.00–1.00)
BKR WAM BASOPHIL ABSOLUTE COUNT.: 0.03 x 1000/ÂµL (ref 0.00–1.00)
BKR WAM BASOPHILS: 0.2 % (ref 0.0–1.4)
BKR WAM BASOPHILS: 0.3 % (ref 0.0–1.4)
BKR WAM EOSINOPHIL ABSOLUTE COUNT.: 0.04 x 1000/ÂµL (ref 0.00–1.00)
BKR WAM EOSINOPHIL ABSOLUTE COUNT.: 0.07 x 1000/ÂµL (ref 0.00–1.00)
BKR WAM EOSINOPHILS: 0.4 % (ref 0.0–5.0)
BKR WAM EOSINOPHILS: 0.8 % (ref 0.0–5.0)
BKR WAM HEMATOCRIT (2 DEC): 29.1 % — ABNORMAL LOW (ref 38.50–50.00)
BKR WAM HEMATOCRIT (2 DEC): 30.7 % — ABNORMAL LOW (ref 38.50–50.00)
BKR WAM HEMOGLOBIN: 10 g/dL — ABNORMAL LOW (ref 13.2–17.1)
BKR WAM HEMOGLOBIN: 10.8 g/dL — ABNORMAL LOW (ref 13.2–17.1)
BKR WAM IMMATURE GRANULOCYTES: 1.4 % — ABNORMAL HIGH (ref 0.0–1.0)
BKR WAM IMMATURE GRANULOCYTES: 3.1 % — ABNORMAL HIGH (ref 0.0–1.0)
BKR WAM LYMPHOCYTES: 6.8 % — ABNORMAL LOW (ref 17.0–50.0)
BKR WAM LYMPHOCYTES: 6.3 % — ABNORMAL LOW (ref 17.0–50.0)
BKR WAM MCH (PG): 30.7 pg (ref 27.0–33.0)
BKR WAM MCH (PG): 31.5 pg (ref 27.0–33.0)
BKR WAM MCHC: 34.4 g/dL (ref 31.0–36.0)
BKR WAM MCHC: 35.2 g/dL (ref 31.0–36.0)
BKR WAM MCV: 89.3 fL (ref 80.0–100.0)
BKR WAM MCV: 89.5 fL (ref 80.0–100.0)
BKR WAM MONOCYTE ABSOLUTE COUNT.: 0.75 x 1000/ÂµL (ref 0.00–1.00)
BKR WAM MONOCYTE ABSOLUTE COUNT.: 0.62 x 1000/ÂµL (ref 0.00–1.00)
BKR WAM MONOCYTES: 7.6 % (ref 4.0–12.0)
BKR WAM MONOCYTES: 7.2 % (ref 4.0–12.0)
BKR WAM MPV: 11.5 fL (ref 8.0–12.0)
BKR WAM MPV: 11.4 fL (ref 8.0–12.0)
BKR WAM NEUTROPHILS: 83.6 % — ABNORMAL HIGH (ref 39.0–72.0)
BKR WAM NEUTROPHILS: 82.3 % — ABNORMAL HIGH (ref 39.0–72.0)
BKR WAM NUCLEATED RED BLOOD CELLS: 0 % (ref 0.0–1.0)
BKR WAM NUCLEATED RED BLOOD CELLS: 0 % (ref 0.0–1.0)
BKR WAM PLATELETS: 97 x1000/ÂµL — ABNORMAL LOW (ref 150–420)
BKR WAM PLATELETS: 93 x1000/ÂµL — ABNORMAL LOW (ref 150–420)
BKR WAM RDW-CV: 12.2 % (ref 11.0–15.0)
BKR WAM RDW-CV: 12.2 % (ref 11.0–15.0)
BKR WAM RED BLOOD CELL COUNT.: 3.26 M/ÂµL — ABNORMAL LOW (ref 4.00–6.00)
BKR WAM RED BLOOD CELL COUNT.: 3.43 M/ÂµL — ABNORMAL LOW (ref 4.00–6.00)
BKR WAM WHITE BLOOD CELL COUNT: 9.8 x1000/ÂµL (ref 4.0–11.0)
BKR WAM WHITE BLOOD CELL COUNT: 8.7 x1000/ÂµL (ref 4.0–11.0)

## 2023-01-01 LAB — HEPATIC FUNCTION PANEL
BKR A/G RATIO: 0.5 — ABNORMAL LOW (ref 1.0–2.2)
BKR ALANINE AMINOTRANSFERASE (ALT): 33 U/L (ref 14–63)
BKR ALBUMIN: 2 g/dL — ABNORMAL LOW (ref 3.4–5.0)
BKR ALKALINE PHOSPHATASE: 70 U/L (ref 46–116)
BKR ASPARTATE AMINOTRANSFERASE (AST): 41 U/L (ref 10–42)
BKR AST/ALT RATIO: 1.2
BKR BILIRUBIN DIRECT: 0.3 mg/dL (ref 0.0–0.4)
BKR BILIRUBIN TOTAL: 0.6 mg/dL (ref 0.2–1.2)
BKR GLOBULIN: 4.4 g/dL — ABNORMAL HIGH (ref 2.3–3.5)
BKR PROTEIN TOTAL: 6.4 g/dL (ref 6.0–8.0)

## 2023-01-01 LAB — SODIUM, URINE, RANDOM W/O CREATININE: BKR SODIUM, URINE RANDOM: 31 mmol/L

## 2023-01-01 LAB — MRSA SCREEN BY PCR: BKR MRSA COLONIZATION STATUS PCR: NOT DETECTED

## 2023-01-01 LAB — BASIC METABOLIC PANEL
BKR ANION GAP: 17 — ABNORMAL HIGH (ref 5–16)
BKR ANION GAP: 14 (ref 5–16)
BKR BLOOD UREA NITROGEN: 54 mg/dL — ABNORMAL HIGH (ref 7–18)
BKR BLOOD UREA NITROGEN: 55 mg/dL — ABNORMAL HIGH (ref 7–18)
BKR BUN / CREAT RATIO: 13.3 (ref 8.0–23.0)
BKR BUN / CREAT RATIO: 13.3 (ref 8.0–23.0)
BKR CALCIUM: 7.4 mg/dL — ABNORMAL LOW (ref 8.5–10.5)
BKR CALCIUM: 7.5 mg/dL — ABNORMAL LOW (ref 8.5–10.5)
BKR CHLORIDE: 95 mmol/L — ABNORMAL LOW (ref 98–107)
BKR CHLORIDE: 96 mmol/L — ABNORMAL LOW (ref 98–107)
BKR CO2: 17 mmol/L — ABNORMAL LOW (ref 21–32)
BKR CO2: 20 mmol/L — ABNORMAL LOW (ref 21–32)
BKR CREATININE: 4.05 mg/dL — ABNORMAL HIGH (ref 0.80–1.30)
BKR CREATININE: 4.15 mg/dL — ABNORMAL HIGH (ref 0.80–1.30)
BKR EGFR, CREATININE (CKD-EPI 2021): 15 mL/min/{1.73_m2} — ABNORMAL LOW (ref >=60)
BKR EGFR, CREATININE (CKD-EPI 2021): 15 mL/min/{1.73_m2} — ABNORMAL LOW (ref >=60)
BKR GLUCOSE: 96 mg/dL (ref 70–100)
BKR GLUCOSE: 110 mg/dL — ABNORMAL HIGH (ref 70–100)
BKR POTASSIUM: 3.5 mmol/L (ref 3.5–5.1)
BKR POTASSIUM: 3.2 mmol/L — ABNORMAL LOW (ref 3.5–5.1)
BKR SODIUM: 129 mmol/L — ABNORMAL LOW (ref 136–145)
BKR SODIUM: 130 mmol/L — ABNORMAL LOW (ref 136–145)

## 2023-01-01 LAB — IMMATURE PLATELET FRACTION (BH GH LMW YH)
BKR WAM IPF, ABSOLUTE: 7.6 x1000/ÂµL (ref <20.0)
BKR WAM IPF, ABSOLUTE: 6.7 x1000/ÂµL (ref <20.0)
BKR WAM IPF: 7.8 % (ref 1.2–8.6)
BKR WAM IPF: 7.2 % (ref 1.2–8.6)

## 2023-01-01 LAB — PHOSPHORUS     (BH GH L LMW YH): BKR PHOSPHORUS: 3.6 mg/dL (ref 2.5–4.9)

## 2023-01-01 LAB — CREATININE, URINE, RANDOM: BKR CREATININE, URINE, RANDOM: 80 mg/dL

## 2023-01-01 LAB — UA REFLEX CULTURE

## 2023-01-01 LAB — MAGNESIUM: BKR MAGNESIUM: 1.8 mg/dL (ref 1.8–2.5)

## 2023-01-01 LAB — NT-PROBNPE: BKR B-TYPE NATRIURETIC PEPTIDE, PRO (PROBNP): 918.5 pg/mL — ABNORMAL HIGH (ref <125.0)

## 2023-01-01 LAB — TROPONIN T HIGH SENSITIVITY, NO REFLEX (BH GH LMW YH): BKR TROPONIN T HS NO REFLEX: 37 ng/L — ABNORMAL HIGH

## 2023-01-01 LAB — UREA NITROGEN, URINE, RANDOM     (BH GH LMW Q YH): BKR UREA NITROGEN, URINE, RANDOM: 517 mg/dL

## 2023-01-01 LAB — OSMOLALITY, URINE, RANDOM: BKR OSMOLALITY, URINE, RANDOM: 401 mosm/kg (ref 300–900)

## 2023-01-01 MED ORDER — VANCOMYCIN 1.5 G IN 500 ML IVPB (VIALMATE)
Freq: Once | INTRAVENOUS | Status: DC
Start: 2023-01-01 — End: 2023-01-02

## 2023-01-01 MED ORDER — LOSARTAN 25 MG TABLET
25 mg | Freq: Every day | ORAL | Status: AC
Start: 2023-01-01 — End: 2023-01-10

## 2023-01-01 MED ORDER — PERFLUTREN LIPID MICROSPHERES 1.3 ML/10 ML NS INJ (IN
Freq: Once | INTRAVENOUS | Status: CP | PRN
Start: 2023-01-01 — End: ?
  Administered 2023-01-01: 16:00:00 10.000 mL via INTRAVENOUS

## 2023-01-01 MED ORDER — METOPROLOL SUCCINATE ER 25 MG TABLET,EXTENDED RELEASE 24 HR
25 mg | Freq: Every day | ORAL | Status: AC
Start: 2023-01-01 — End: ?

## 2023-01-01 NOTE — ED Provider Notes
Chief Complaint Patient presents with ? Cellulitis   Pt presents with bilateral swollen legs, headache, nausea and weakness. States he has not been able to eat since wednesday 12/27/2022.  --------------------------------------------------------------------------------------Emergency Medicine Attending MDM: Presentation:The patient is a 72 y.o. male with hx HTN, urinary retention with bilateral hydronephrosis and renal failure requiring bilateral nephrostomy tubes who ultimately underwent HOLEP surgery with Dr. Oliva Bustard on 05/18/21 who presents with generalized weakness. While walking into the ED, he experienced a fall in the parking lot and was helped into a wheelchair by security. He did not hit his head. He was sitting in car since 2 AM because he felt too weak to venture into the ED. Reports 4 days of flu-like symptoms, no PO intake. Has nausea, vomiting and diarrhea. No chest pain. Has been short of breath. Denies AP. Denies dysuria. Hasn't taken his meds since Wednesday because he didn't think about it. Pertinent physical exam findings include: Gen:  alert and oriented x3, answering questions appropriatelyNeuro:  no sensory deficits or focal weakness, moving all extremities and following commandsHEENT:  MM moist, no oropharyngeal lesions, trachea midlinePulm:  Tachypneic, no wheezing or cracklesCard:  normal rate, no obvious murmurs (in loud ED setting)Abd:  SNTND, no palpable masses, no rebound or guardingExtr/Skin:  warm and dry, 2+ BLE pitting edema, significantly worse on left with erythema throughout lower leg.Differential Diagnosis: Renal failure w/ volume overloadHeart failureACSDVTPEElectrolyte abnormalityCOVID-19InfluenzaAdditional diagnoses that were considered but felt less likely based on history and exam include: stroke, seizure Plan: Testing: labs, ECG, troponins, UA, TSHImaging: [place] H/N, [place] CAP, DVT USManagement: home meds, vanc/unasynConsults: noneED Course:ECG reviewed, no acute ischemic changes noted. Troponins mildly elevated with flat trend, less likely ACS.Given tachypnea he was trialed with nebs with some improvement, although remained without wheezing.Work up notable for AKI, clinically with left lower leg cellulitis for which he was given vanc/unasyn. Admitted to medicine service. Derrill Center, MDPhysician, Department of Emergency MedicineBridgeport [facility] on Mobile Heartbeat-----------------------------------------------------------------------------------------Please note that this chart may have been partially/fully written using dictation software and therefore may be subject to typos, which were corrected to the best of my ability. Please excuse any typos that remain present. An acute or life threatening problem was considered during this evaluation  A decision regarding hospitalization was made during this visit  External data reviewed: Notes (OSH or non-ED), Radiology, Labs and ECGIndependent interpretation of: ECGDirectly spoke with:  Hospitalist  Physical ExamED Triage VitalsBP: n/aPulse: n/aPulse from  O2 sat: n/aResp: n/aTemp: n/aTemp src: n/aSpO2: n/a BP 127/70  - Pulse 88  - Temp 98.8 ?F (37.1 ?C) (Oral)  - Resp 20  - Ht 5' 11 (1.803 m)  - Wt 104.3 kg (230 lb)  - SpO2 94%  - BMI 32.08 kg/m? Physical Exam ProceduresAttestation/Critical CareClinical Impressions as of 01/01/23 1717 Cellulitis AKI (acute kidney injury) [facility] Code)  ED DispositionAdmit Halina Andreas, MD01/08/24 1717

## 2023-01-01 NOTE — ED Notes
7:49 PM received pt on bed, awake and responsive, calm and cooperative, above IVF consumed, followed up with same at 75 cc/hr. Meds given PO. Left on bed, resting.8:51 PMPhlebotomist at bedside. Blood work taken, sent to lab, awaiting result.9:25 PM Pt rounded, awake and oriented. Pt  requested food, given.11:56 PM Pt rounded, VS rechecked and recorded. IV antibiotic done.3:02 AMPt rounded, still sleeping comfortably6:09 AM Pt rounded,  sleeping comfortably. Meds given IVP.IVF still on going7:20 AM Give report to [employer]

## 2023-01-01 NOTE — ED Notes
12:02 PM assumed care of patient. Admitted for LLE cellulitis w/antibiotics. Maintenance IVF infusing. Pad remains under LLE with patient laying in bed, seeping clear fluid, elevation provided. Pt reports pain 4/10, unable to participate in PT due to pain. Pt declines need for pain medication at this time, states he will request pain meds when pain increases to 6-7/10. Urinal provided and within reach, breakfast remains at bedside. Pt c/o chills, afebrile, warm blanket provided. 1:29 PM infection MD at bedside for evaluation.2:03 PM lunch tray provided. 3:55 PM Pt tolerated 50% of meal tray. Declines need for pain medication at this time and declines to be repositioned. Nephrology at bedside.

## 2023-01-01 NOTE — Other
Home Hospital Screening ToolPatient Data:  Patient Name: Marc Evans Age: 72 y.o. DOB: [birth year]	 MRN: [id number]	 Brief History AKI on CKD, hyponatremiaNext steps: Patient clinically appropriate for [facility] [place] Control nurse will perform social criteria evaluationPlease call [facility] [phone] with any questionsPatient's calculated screening result: Clinical Screening Tool Result: MEETS CRITERIA for [facility] as of today but may meet clinical criteria on subsequent days.Home Hospital provides acute care to eligible patients who meet inpatient level of care in their own home. Eligible patients are non-pregnant adults with traditional Medicare, eligible Managed Medicare or [facility] employee health plan who meet specific clinical and social stability criteria.Patients will receive multiple in-person nursing visits per day, 24/7 virtual nursing, scheduled and PRN in-person PA or APRN visits and daily video MD visits.   Diagnostics and interventions include IV infusion/antibiotics, rehab therapy, mobile imaging and video specialty consult follow up.  For more information: [email or web address] Clinical Screening Tool - 01/01/23 0852    Clinical Screening Tool  Are step-down/ICU level interventions currently required or anticipated to be required? No   Is the patient likely to need any of the below within the next 12-24hrs? No   Clinical Screening Tool Result MEETS CRITERIA      Electronically Signed:Penni Penado Monia Pouch, MD1/07/2023, 8:53 AMIf needed the [facility] Control MD can be reached at [phone] OR to text, search for the [facility] Dynamic role: [facility] Control All.

## 2023-01-01 NOTE — Plan of Care
Plan of Care Overview/ Marc Evans Status    Admission Note Nursing Marc Evans is a 72 y.o. male admitted with a chief complaint of poor po intake, LLE swollen, painful. Marc Evans arrived from  homePatient is  a/o x4, limited mobility d/t LLE cellulitis  Vitals:  01/01/23 1154 01/01/23 1346 01/01/23 1725 01/01/23 1802 BP: 127/66 127/70 (!) 155/62 (!) 126/57 Pulse: 84 88 87 82 Resp: 20 20 18 16  Temp: 98.8 ?F (37.1 ?C)  98.8 ?F (37.1 ?C) 97.7 ?F (36.5 ?C) TempSrc: Oral  Oral Oral SpO2: 95% 94% 95% 95% Weight:    112.6 kg Height:    5' 11 (1.803 m) Oxygen therapy Oxygen TherapySpO2: 95 %Device (Oxygen Therapy): room airI have reviewed the Marc Evans's current medication orders.     Current Facility-Administered Medications Medication Dose Route Frequency Provider Last Rate Last Admin  amLODIPine (NORVASC) tablet 10 mg  10 mg Oral Daily Sedaliu, Tomor, MD   10 mg at 01/01/23 0934  ampicillin-sulbactam (UNASYN) 1.5 g in sodium chloride 0.9% 50 mL (mini-bag plus)  1.5 g Intravenous Q8H Sedaliu, Tomor, MD   Stopped at 01/01/23 1403  heparin (PORCINE) injection 5,000 Units  5,000 Units Subcutaneous Q8H Sedaliu, Tomor, MD   5,000 Units at 01/01/23 1345  hydrALAZINE (APRESOLINE) tablet 25 mg  25 mg Oral 3 times daily Sedaliu, Tomor, MD   25 mg at 01/01/23 1346  metoprolol succinate XL (TOPROL-XL) 24 hr tablet 25 mg  25 mg Oral Daily Sedaliu, Tomor, MD   25 mg at 01/01/23 0934  rosuvastatin (CRESTOR) tablet 5 mg  5 mg Oral Daily Sedaliu, Tomor, MD   5 mg at 01/01/23 0934  sodium chloride 0.9 % flush 3 mL  3 mL IV Push Q8H Sedaliu, Tomor, MD   3 mL at 01/01/23 0605  vancomycin (VANCOCIN) 1.5 g in sodium chloride 0.9% 500 mL IVPB (vialmate)  1.5 g Intravenous Once Giuran-Benetato, Nena Polio, MD      Vancomycin Therapy Placeholder   Intravenous placeholder Sedaliu, Tomor, MD       sodium chloride 75 mL/hr (01/01/23 1201) acetaminophen, morphine (ADULT), ondansetron **OR** ondansetron (ZOFRAN) IV Push, sodium chlorideCurrent Facility-Administered Medications Medication Dose Route Frequency Last Rate  acetaminophen  650 mg Oral Q6H PRN    amLODIPine  10 mg Oral Daily    ampicillin-sulbactam  1.5 g Intravenous Q8H Stopped (01/01/23 1403)  heparin (PORCINE)  5,000 Units Subcutaneous Q8H    hydrALAZINE  25 mg Oral 3 times daily    metoprolol succinate XL  25 mg Oral Daily    morphine (ADULT)  2 mg Subcutaneous Q4H PRN    ondansetron  4 mg Oral Q6H PRN    Or  ondansetron (ZOFRAN) IV Push  4 mg IV Push Q6H PRN    rosuvastatin  5 mg Oral Daily    sodium chloride  3 mL IV Push Q8H    sodium chloride  3 mL IV Push PRN for Line Care    sodium chloride  75 mL/hr Intravenous Continuous 75 mL/hr (01/01/23 1201)  vancomycin  1.5 g Intravenous Once    Vancomycin Therapy Placeholder   Intravenous placeholder   .I have reviewed Marc Evans valuables Belongings charted in last 7 days: Valuable(s) : Vision; Regulatory affairs officer; Money; Keys; Other (Comments) (chargers) (12/31/2022  4:43 PM) Comments:See flowsheets, Marc Evans education and plan of care for additional information.

## 2023-01-01 NOTE — Other
[facility]	Infectious Disease Consult NoteHPI: Chief Complaint:HPI 72 year old man with past medical history as outlined below.  Patient admitted to the hospital on January 7th with severe weakness for the past 3 days.  In the emergency room he was hypertensive and was noted to have left lower extremity cellulitis.Infectious diseases consult was requested to evaluate and assist with treatment. Medical History: PMH PSH Past Medical History: Diagnosis Date ? High blood pressure  ? Hypertension    Past Surgical History: Procedure Laterality Date ? TONSILLECTOMY    as a child ? 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   Allergies Allergies Patient has no known allergies. Review of Systems: No fever, no shortness of breath or cough, positive weakness, no chest pain, no abdominal pain, no pain in the left legData: Intake/Output: Gross Totals (Last 24 hours) at 01/01/2023 1101Last data filed at 01/01/2023 0715Intake 1650 ml Output -- Net 1650 ml Vitals:Temp:  [97.9 ?F (36.6 ?C)-99.1 ?F (37.3 ?C)] 99 ?F (37.2 ?C)Pulse:  [81-103] 85Resp:  [17-25] 20BP: (117-194)/(65-83) 133/81NIBP MAP (mmHg) (calculated/READ ONLY):  [84-116] 98Meds:Current Facility-Administered Medications Medication Dose Route Frequency ? amLODIPine  10 mg Oral Daily ? ampicillin-sulbactam  1.5 g Intravenous Q8H ? heparin (PORCINE)  5,000 Units Subcutaneous Q8H ? hydrALAZINE  25 mg Oral 3 times daily ? metoprolol succinate XL  25 mg Oral Daily ? rosuvastatin  5 mg Oral Daily ? sodium chloride  3 mL IV Push Q8H ? vancomycin  1.5 g Intravenous Once ? Vancomycin Therapy Placeholder   Intravenous placeholder Exam: Lungs clear, heart S1-S2 regular, abdomen soft no tenderness, extremities left lower extremity edema and extensive erythema, neurologic the patient alertStudies: Labs: Last 24 hours: Recent Results (from the past 24 hour(s)) Mag  Collection Time: 12/31/22 11:20 AM Result Value Ref Range  Magnesium 1.9 1.8 - 2.5 mg/dL Troponin T High Sensitivity, Emergency; 0 hour baseline AND 1 hour with reflex (3 hour)  Collection Time: 12/31/22 11:20 AM Result Value Ref Range  High Sensitivity Troponin T 34 (H) See Comment ng/L Comprehensive metabolic panel  Collection Time: 12/31/22 11:20 AM Result Value Ref Range  Sodium 129 (L) 136 - 145 mmol/L  Potassium 3.9 3.5 - 5.1 mmol/L  Chloride 93 (L) 98 - 107 mmol/L  CO2 19 (L) 21 - 32 mmol/L  Anion Gap 17 (H) 5 - 16  Glucose 99 70 - 100 mg/dL  BUN 48 (H) 7 - 18 mg/dL  Creatinine 9.52 (H) 8.41 - 1.30 mg/dL  Calcium 8.1 (L) 8.5 - 10.5 mg/dL  BUN/Creatinine Ratio 32.4 8.0 - 23.0  Total Protein 7.4 6.0 - 8.0 g/dL  Albumin 2.7 (L) 3.4 - 5.0 g/dL  Total Bilirubin 0.9 0.2 - 1.2 mg/dL  Alkaline Phosphatase 80 46 - 116 U/L  Alanine Aminotransferase (ALT) 33 14 - 63 U/L  Aspartate Aminotransferase (AST) 42 10 - 42 U/L  Globulin 4.7 (H) 2.3 - 3.5 g/dL  A/G Ratio 0.6 (L) 1.0 - 2.2  AST/ALT Ratio 1.3 Reference Range Not Established  eGFR (Creatinine) 14 (L) >=60 mL/min/1.58m2 Lactic Acid, Whole Blood, Venous (Reflex 2H Repeat)  (MC)  Collection Time: 12/31/22 11:30 AM Result Value Ref Range  Lactic Acid, Blood Gas 1.3 0.6 - 1.4 mmol/L TSH w/reflex to T4  Collection Time: 12/31/22 11:37 AM Result Value  Ref Range  Thyroid Stimulating Hormone 1.480 See Comment ?IU/mL CBC auto differential  Collection Time: 12/31/22 11:37 AM Result Value Ref Range  WBC 11.8 (H) 4.0 - 11.0 x1000/?L  RBC 4.30 4.00 - 6.00 M/?L  Hemoglobin 13.2 13.2 - 17.1 g/dL Hematocrit 16.10 96.04 - 50.00 %  MCV 89.5 80.0 - 100.0 fL  MCH 30.7 27.0 - 33.0 pg  MCHC 34.3 31.0 - 36.0 g/dL  RDW-CV 54.0 98.1 - 19.1 %  Platelets 113 (L) 150 - 420 x1000/?L  MPV 11.7 8.0 - 12.0 fL  Neutrophils 85.1 (H) 39.0 - 72.0 %  Lymphocytes 6.7 (L) 17.0 - 50.0 %  Monocytes 7.2 4.0 - 12.0 %  Eosinophils 0.1 0.0 - 5.0 %  Basophil 0.3 0.0 - 1.4 %  Immature Granulocytes 0.6 0.0 - 1.0 %  nRBC 0.0 0.0 - 1.0 %  ANC(Abs Neutrophil Count) 10.01 (H) 2.00 - 7.60 x 1000/?L  Absolute Lymphocyte Count 0.79 0.60 - 3.70 x 1000/?L  Monocyte Absolute Count 0.85 0.00 - 1.00 x 1000/?L  Eosinophil Absolute Count 0.01 0.00 - 1.00 x 1000/?L  Basophil Absolute Count 0.03 0.00 - 1.00 x 1000/?L  Absolute Immature Granulocyte Count 0.07 0.00 - 0.30 x 1000/?L  Absolute nRBC 0.00 0.00 - 1.00 x 1000/?L SARS CoV-2 (COVID-19) RNA-[facility])  Collection Time: 12/31/22 11:41 AM  Specimen: Nasopharynx; Viral Result Value Ref Range  SARS-CoV-2 RNA (COVID-19)  Negative Negative Influenza A+B/RSV by RT-PCR ([facility])  Collection Time: 12/31/22 11:41 AM  Specimen: Nasopharynx; Viral Result Value Ref Range  Influenza A Negative Negative  Influenza B Negative Negative  Respiratory Syncytial Virus Negative Negative UA reflex to culture  Collection Time: 12/31/22  1:36 PM  Specimen: Clean Catch (Voided); Urine Result Value Ref Range  Reflex Urine Culture See Comment  Urinalysis with culture reflex     ([facility])  Collection Time: 12/31/22  1:36 PM  Specimen: Clean Catch (Voided); Urine Result Value Ref Range  Clarity, UA Clear Clear  Color, UA Yellow Yellow, Colorless  Specific Gravity, UA 1.010 1.005 - 1.030  pH, UA 6.5 5.5 - 7.5  Protein, UA 2+ (A) Negative-Trace  Glucose, UA Negative Negative  Ketones, UA 1+ (A) Negative  Blood, UA 2+ (A) Negative  Bilirubin, UA Negative Negative  Leukocytes, UA Negative Negative  Nitrite, UA Negative Negative  Urobilinogen, UA <2.0 <=2.0 mg/dL Urine microscopic     ([facility])  Collection Time: 12/31/22  1:36 PM Result Value Ref Range  Manual Microscopic Performed   Bacteria, UA Few (A) None-Rare /HPF  Urine Squamous Epithelial Cells, UA Few None-Few /HPF NT-proBrain natriuretic peptide  Collection Time: 12/31/22  1:40 PM Result Value Ref Range  NT-proBNP 918.5 (H) <125.0 pg/mL Lactic Acid, Whole Blood, Venous (Reflex 2H Repeat)  (MC)  Collection Time: 12/31/22  1:40 PM Result Value Ref Range  Lactic Acid, Blood Gas 0.8 0.6 - 1.4 mmol/L Troponin T High Sensitivity, 1 Hour With Reflex ([facility])  Collection Time: 12/31/22  1:40 PM Result Value Ref Range  High Sensitivity Troponin T 35 (H) See Comment ng/L  1 hour Delta from 0 Hour, HS-Troponin T 1 ng/L MRSA colonization status by PCR  Collection Time: 12/31/22  2:52 PM  Specimen: Nares; Culture Result Value Ref Range  MRSA Colonization Status by PCR Not Detected Not Detected Troponin T High Sensitivity, 3 Hour ([facility])  Collection Time: 12/31/22  3:50 PM Result Value Ref Range  High Sensitivity Troponin T 37 (H) See Comment ng/L  1 hour Delta from 0 Hour, HS-Troponin T 1 ng/L  3 hour Delta from 0 Hour, HS-Troponin T 3 ng/L Basic metabolic panel  Collection Time: 12/31/22  3:50 PM Result Value Ref Range  Sodium 126 (L) 136 - 145 mmol/L  Potassium 3.6 3.5 - 5.1 mmol/L  Chloride 91 (L) 98 - 107 mmol/L  CO2 16 (L) 21 - 32 mmol/L  Anion Gap 19 (H) 5 - 16  Glucose 96 70 - 100 mg/dL  BUN 51 (H) 7 - 18 mg/dL  Creatinine 0.98 (H) 1.19 - 1.30 mg/dL  Calcium 8.0 (L) 8.5 - 10.5 mg/dL  BUN/Creatinine Ratio 14.7 8.0 - 23.0  eGFR (Creatinine) 15 (L) >=60 mL/min/1.21m2 Urea nitrogen, urine, random     ([facility] Q [facility])  Collection Time: 12/31/22  5:02 PM Result Value Ref Range  Urea Nitrogen, Urine, Random 517 Reference Range Not Established mg/dL Blood culture #1  Collection Time: 12/31/22  5:02 PM  Specimen: Vein, Peripheral; Blood Result Value Ref Range  Blood Culture No Growth to Date  Creatinine, urine, random  Collection Time: 12/31/22  5:48 PM Result Value Ref Range  Creatinine, Urine, Random 80 Reference Range Not Established mg/dL Blood culture #2  Collection Time: 12/31/22  5:52 PM  Specimen: Vein, Peripheral; Blood Result Value Ref Range  Blood Culture No Growth to Date  Troponin T High Sensitivity, NO REFLEX  Collection Time: 12/31/22  8:55 PM Result Value Ref Range  High Sensitivity Troponin T 37 (H) See Comment ng/L Basic metabolic panel  Collection Time: 12/31/22  8:55 PM Result Value Ref Range  Sodium 129 (L) 136 - 145 mmol/L  Potassium 3.5 3.5 - 5.1 mmol/L  Chloride 95 (L) 98 - 107 mmol/L  CO2 17 (L) 21 - 32 mmol/L  Anion Gap 17 (H) 5 - 16  Glucose 96 70 - 100 mg/dL  BUN 54 (H) 7 - 18 mg/dL  Creatinine 8.29 (H) 5.62 - 1.30 mg/dL  Calcium 7.4 (L) 8.5 - 10.5 mg/dL  BUN/Creatinine Ratio 13.0 8.0 - 23.0  eGFR (Creatinine) 15 (L) >=60 mL/min/1.43m2 Vancomycin, random     ([facility])  Collection Time: 01/01/23  9:04 AM Result Value Ref Range  Vancomycin Random 17.4 Reference Range Not Established ug/mL CBC auto differential  Collection Time: 01/01/23  9:04 AM Result Value Ref Range  WBC 9.8 4.0 - 11.0 x1000/?L  RBC 3.26 (L) 4.00 - 6.00 M/?L  Hemoglobin 10.0 (L) 13.2 - 17.1 g/dL  Hematocrit 86.57 (L) 84.69 - 50.00 %  MCV 89.3 80.0 - 100.0 fL  MCH 30.7 27.0 - 33.0 pg  MCHC 34.4 31.0 - 36.0 g/dL  RDW-CV 62.9 52.8 - 41.3 %  Platelets 97 (L) 150 - 420 x1000/?L  MPV 11.5 8.0 - 12.0 fL  Neutrophils 83.6 (H) 39.0 - 72.0 %  Lymphocytes 6.8 (L) 17.0 - 50.0 %  Monocytes 7.6 4.0 - 12.0 %  Eosinophils 0.4 0.0 - 5.0 %  Basophil 0.2 0.0 - 1.4 %  Immature Granulocytes 1.4 (H) 0.0 - 1.0 %  nRBC 0.0 0.0 - 1.0 %  ANC(Abs Neutrophil Count) 8.22 (H) 2.00 - 7.60 x 1000/?L  Absolute Lymphocyte Count 0.67 0.60 - 3.70 x 1000/?L  Monocyte Absolute Count 0.75 0.00 - 1.00 x 1000/?L  Eosinophil Absolute Count 0.04 0.00 - 1.00 x 1000/?L  Basophil Absolute Count 0.02 0.00 - 1.00 x 1000/?L  Absolute Immature Granulocyte Count 0.14 0.00 - 0.30 x 1000/?L  Absolute nRBC 0.00 0.00 - 1.00 x 1000/?L Immature Platelet Fraction [facility])  Collection Time: 01/01/23  9:04 AM Result Value Ref Range  Immature Platelet Fraction 7.8 1.2 - 8.6 %  Absolute Immature Platelet Fraction 7.6 <20.0 x1000/?L Diagnostics:Reviewed Assessment: This is a 72 year old man with past medical history as outlined above.  Patient admitted to the hospital on January 7th with severe weakness for the past 3 days.  In the emergency room he was hypertensive and was noted to have left lower extremity cellulitis.  No fever.White blood cell count 9.8.  Currently on vancomycin and Unasyn.Plan: 1. Left lower extremity cellulitisPlan:Strict leg elevation.  Empiric coverage with vancomycin and Unasyn.  Follow cultures and adjust antibiotics accordingly.  Monitor white blood cell count and temperatures.Infectious Disease Attending Attestation: Signed:Rylinn Linzy, MD January 01, 2023

## 2023-01-01 NOTE — Other
-  CONSULT  REQUEST  DOCUMENTATION-[facility] NOTE-Type of consult: [facility] -[facility] Consult[facility]alee Hua Dancer /Location: Rm15/15 / Electrolye imbalance, please call [phone] confirm receipt of this message by texting back OK-1 - Mobile Heartbeat message sent to [place], Shela Commons at 8:32 AM. Received response at 0832.-Tori Dattilio Young1/8/20248:31 [facility] [phone]

## 2023-01-01 NOTE — Progress Notes
VANCOMYCIN LEVEL EVALUATIONCurrent Vancomycin Order: 1.5 g IV per laboratory/placeholder data. Day of Therapy: 2Vanco Trough Goal: 10-15Vancomycin Indication: Bilateral LE wounds/cellulitis. Estimated Creatinine Clearance: 18 mL/min (A) (by C-G formula based on SCr of 4.05 mg/dL (H)).Creatinine (mg/dL) Date Value 16/09/9603 4.05 (H) 12/31/2022 4.06 (H) 12/31/2022 4.25 (H)  Renal Function: AKILevel: Lab Results Last 72 Hours Component Value Date/Time  Vancomycin Random 17.4 01/01/2023 09:04 AM Type of Level: Random (18 hours post-dose)Based on vancomycin level obtained, recommend:  Dose 1.5 gm x 1 tomorrow AM. Repeat Level:  1/10@0600ID /AST Consulted? No BH Vancomycin Dosing GuidelineFor questions, please contact the pharmacist: Windy Fast, PharmD via [facility].

## 2023-01-01 NOTE — Other
[facility]	General Surgery Consult NotePatient Data:  Patient Name: Marc Evans Age: 72 y.o. DOB: [birth year]	 MRN: [id number]	  Consult Information: Consultation requested by: Dr. Feliberto Gottron for consultation: cellulitis LLESource of Information: Patient and EMR/Previous RecordPresentation History: HPI  72 yo male who presents with worsening pain and swelling in the b/l legs L worse than R.Review of Allergies/Medical History/Medications: I have reviewed the patient's allergies, prior to admission meds, past medical/surgical, family and social hx. Inpatient Medications:I have reviewed the patient's current medication orders.Review of Systems: Review of Systems Constitutional: Negative.  HENT: Negative.  Eyes: Negative.  Respiratory: Negative.  Cardiovascular: Negative.  Gastrointestinal: Negative.  Endocrine: Negative.  Genitourinary: Negative.  All other systems reviewed and are negative.Physical Exam: Vitals:I have reviewed the patient's current vital signs as documented in the patient's EMR.  Last 24 hours: Temp:  [97.2 ?F (36.2 ?C)-99.1 ?F (37.3 ?C)] 99 ?F (37.2 ?C)Pulse:  [81-103] 85Resp:  [17-51] 20BP: (117-202)/(65-139) 133/81SpO2:  [92 %-98 %] 97 %Intake/Output:I have reviewed the patient's current I&O's as documented in the EMR.Physical ExamConstitutional:     Appearance: Normal appearance. Eyes:    General: No scleral icterus.Cardiovascular:    Rate and Rhythm: Normal rate. Pulmonary:    Effort: Pulmonary effort is normal. Abdominal:    General: There is no distension.    Palpations: Abdomen is soft.    Tenderness: There is no abdominal tenderness. Musculoskeletal:    Comments: Significant pitting edema of b/l legs. L>R. Signs of chronic venous disease bilaterally but significant acute erythema and tenderness on L. Palpable DP on L. Neurological:    Mental Status: He is alert. Review of Labs/Diagnostics: Lab Review:I have reviewed the patient's labs within the last 24 hrs.Diagnostic Review:I have reviewed the patient's Radiology report(s) within the last 48 hrs. All results are within normal limits.  Impression/Plan 1. LLE edema and cellulitis	Recommend tx of what is likely acute on chronic b/l LE edema	Cellulitis of LLE -> IV abx	No indication for an surgical intervention, please call with questions.Hartford Poli [phone] examined the patient on 01/01/2023 at about 7:56 AM

## 2023-01-01 NOTE — ED Notes
11:42 AM Patient presents to the ER this morning with weeping, edematous, and erythematous lower extremities. Multiple blistering lesions noted on bilateral lower legs. Patient reports he drove to [facility] ER last night but did not enter the building, states I took a few naps in my car and worked up the courage to come in this morning. Upon entering [place] ER, patient slipped and fell in parking lot, patient denies headstrike during fall, no blood thinners. Patient denies sustaining any injuries in the fall. Patient reports he may have had the flu starting Wednesday, endorses fevers, chills, nausea, vomiting, diarrhea, and cough with mucous production. Patient first noticed the edema on Wednesday and has not been able to eat since this time, denies numbness or tingling in BLE. Sensation in tact in BLE, reports dyspnea on exertion. Denies headache or chest pain. 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 IV access established, labs drawn and sent, patient swabbed for COVID-19/Flu/RSV.11:50 AMPatient noted to be hypertensive upon arrival, Dr. Lucretia Roers at bedside and aware. Administered PO Coreg, Apresoline, and Torsemide. CXR completed. 12:45 PMPatient resting in ER stretcher, bed in lowest position, call-bell within reach.1:30 PMVital signs updated, patient hypertensive. Fall precautions maintained.2:45 PMVital signs stable, patient resting in ER stretcher.3:00 PMIV Vancomycin initiated. Patient reports left leg pain rates 7/10, Dr. Lucretia Roers notified, administered 4 mg morphine IVP. Vital signs updated, Dr. Lucretia Roers at bedside discussing admission with patient.3:50 PMPatient reports pain level has improved to 2/10 following morphine administration, side rails raised, call-bell within reach. 5:30 PMVital signs stable, patient resting in ER stretcher. IV fluids and vancomycin initiated. Blood cultures collected and sent.6:45 PMIV fluids running.7:00 PMReport given to [employer]. Vital signs stable.

## 2023-01-01 NOTE — ED Notes
7:15 AM Handoff report received from Burnet, RN - pt resting opn stretcher, side rails up, call bell within reach, NS infusing, awaiting bed assignment. 9:04 AM Lab at bedside for repeat blood collection9:34 AM Pt medicated with PO morning medications per MAR. 9:45 AM incontinent care performed - pt washed, changed, and repositioned on stretcher10:00 AM Pt to ECHO with tech 11:05 AM Handoff report given to Almira Coaster, Charity fundraiser

## 2023-01-01 NOTE — Consults
[facility]	 [facility]	Nephrology Consult Note Consult Information: Consultation requested by: Loman Chroman *Reason for consultation: AKI on CKDAssessment  Impression/Recommendations: Marc Evans  is a 72 year old male with a medical history of CKD 4, hypertension, hyperlipidemia, BPH with outflow obstruction, hx of bilateral nephrosis with bilateral nephrostomy tubes, s/p HOLEP surgery (05/18/21), history of UTIs with foley catheters, history of epididymoorchitis.  He came in for evaluation of weakness, not eating (but drinking) for three days.  Found to have AKI on CKD.  Left leg cellulitis.  Blood cultures 12/31/22 drawn.  ID consulting.AKI on CKD, non oliguricBaseline serum creatinine last November thought to be 2.5-3.0.  Etiology of CKD was thought to be related to obstructive nephropathy in 2021, relieved by bilateraly nephrostomy tubes, followed by HOLEP surgery in 05/18/21.  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  UAs 2021-2022 had moderate proteinuria.His sCr now in 4.0s.  Etiology possibly ATN iso infection vs prerenal/CRS.  [place] AP without iv contrast 12/31/22 shows no obstructive nephropathy.  UA 12/31/22 had 1+ protein, 1+ ketones, 2+ blood, few bacteria.  FeUrea 12/31/22 was 48.5% suggestive of intrinsic renal disease.  UPCR 11/27/22 had 0.940 g/mgCr.  UA 11/27/22 bland except for 1+ protein.Serologies done in December 2023 (FLC with ratio, SPEP, serum and urine immunofix) were unrevealing.His sCr on 01/01/23 was 4.15 vs 4.25 on 12/31/22, stable.    Urine output not charted.  He does not appear grossly fluid overloaded on exam except for LE.  He is on Unasyn IVP and vancomycin IVP for infection, and received a dose of torsemide on 12/31/22.  His vancomycin random level was 17.4 on 01/01/23.Echo 01/01/23 showed normal LV function; LVEF of 55-60%; cannot rule out wall motion abnormalities; no significant valve abnormalities on limited study.Recommendations/Plan:-OK to continue IVF NS at 75 mL/hour for now-Continue to hold losartan-Would hold further diuretics with low threshold to resume of SOB or other evidence of worsening fluid overload-BMP daily-Strict I&O; measure urine output-Bladder scan q shift to rule out retention-Avoid nephrotoxins, including NSAIDS and iv contrast dye, if ableHyponatremiaMild to moderate.  Serum sodium 129 on arrival to ED on 12/31/22,, worsened to 126 later same day and improved to 130 on 01/01/23 with IVF NS.  No prior history noted of hyponatremia.  Possibly inadequate intake of solutes (salt and protein) prior to admission .-Urine lytes (urine sodium, urine osm), serum osm (ordered)-OK to continue IVF NS at 75 mL/hour for now-BMP daily-Encourage intake of food with proteinSubjective  Presentation History: Marc Evans is a 72 year old male with a medical history of CKD 4, hypertension, hyperlipidemia, BPH with outflow obstruction, hx of bilateral nephrosis with bilateral nephrostomy tubes, s/p HOLEP surgery (05/18/21).Presented to ED on 12/31/22 for evaluation of severe weakness for past three days.  In ED, he was afebrile, hypertensive (SBP 190-200s), with O2 sat of 98% on room air.  Abnormal labs:  Na 129, CO2 19, AG 17, creatinine 4.25, BUN 48, Ca 8.1, Albumin 2.7, HS troponin 34, NT ProBNP 918.5, WBC 11.8.  CXR 12/31/22 showed no cardiothoracic abnormality.  Duplex lower extremity 12/31/22 no evidence of DVT.  [place] head/cervical spine on acute abnormalities.  [place] AP 12/31/22 showed evidence of chronic large airway disease/aspiration including endotrachial debris, linear bibasilar scarring and diffuse bronchial wall thickening.Renal ultrasound 01/01/23 showed unremarkable kidneys; no obstructive nephropathy.  UA 12/31/22 had 2+ protein, 1+ ketones, 2+ blood, few bacteria.Given unasyn, coreg, hydralazine, morphine, IVF NS 1.0 L bolus,  torsemide, vancomycin IVP.  Na worsened to 126.  IVF NS at 75 mL/hour.  Na improved back to 129.  Serum creatinine improved to 4.05. We were consulted for AKI on CKD.  He was seen in outpatient nephrology by Dr. Larna Daughters of our practice on 10/31/22.  At that time, his baseline serum creatinine range was thought to be 2.5-3.0.  CKD was thought to be related to obstructive nephropathy in 2021, relieved by bilateraly nephrostomy tubes, followed by HoLEP surgery in 05/18/21.  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  UAs 2021-2022 had moderate proteinuria.  Medications:  Outpatient, he takes amlodipine, losartan, metoprolol succinate, hydralazine, torsemide and cholecalciferol, et al.No family Hx of kidney disease and no use of NSAIDS at home.No recent exposure to IV contrast dye noted.Interim History:O/N no acute events.  Seen this afternoon, lying in ED bed.  C/o headache, dizziness, nausea.  He said he feels cold.  Denies SOB.  SBP 110-160s.  O2 sat 92-97% on room air.Review of Allergies/Medical History/Medications: PMH PSH Past Medical History: Diagnosis Date ? High blood pressure  ? Hypertension   Past Surgical History: Procedure Laterality Date ? TONSILLECTOMY    as a child ? 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  Social History Family History Social History Tobacco Use ? Smoking status: Former   Current packs/day: 0.00   Types: Cigarettes   Quit date: 2000   Years since quitting: 24.0 Vaping Use ? Vaping Use: Never used Substance Use Topics ? Alcohol use: No ? Drug use: No  Family History Problem Relation Age of Onset ? Arrhythmia Mother  ? Heart failure Father  ? Heart attack Father  ? No Known Problems Sister  ? No Known Problems Brother   Allergies No Known Allergies Prior to Admission Medications (Not in a hospital admission) Inpatient Medications:Current Facility-Administered Medications Medication Dose Route Frequency Provider Last Rate Last Admin ? amLODIPine (NORVASC) tablet 10 mg  10 mg Oral Daily Sedaliu, Tomor, MD   10 mg at 01/01/23 0934 ? ampicillin-sulbactam (UNASYN) 1.5 g in sodium chloride 0.9% 50 mL (mini-bag plus)  1.5 g Intravenous Q8H Sedaliu, Tomor, MD   Stopped at 01/01/23 0715 ? heparin (PORCINE) injection 5,000 Units  5,000 Units Subcutaneous Q8H Sedaliu, Tomor, MD   5,000 Units at 01/01/23 0605 ? hydrALAZINE (APRESOLINE) tablet 25 mg  25 mg Oral 3 times daily Sedaliu, Tomor, MD   25 mg at 01/01/23 0934 ? metoprolol succinate XL (TOPROL-XL) 24 hr tablet 25 mg  25 mg Oral Daily Sedaliu, Tomor, MD   25 mg at 01/01/23 0934 ? rosuvastatin (CRESTOR) tablet 5 mg  5 mg Oral Daily Sedaliu, Tomor, MD   5 mg at 01/01/23 0934 ? sodium chloride 0.9 % flush 3 mL  3 mL IV Push Q8H Sedaliu, Tomor, MD   3 mL at 01/01/23 0605 ? Vancomycin Therapy Placeholder   Intravenous placeholder Macon Lesesne Form, MD     ? sodium chloride 75 mL/hr (12/31/22 1948) acetaminophen, morphine (ADULT), ondansetron **OR** ondansetron (ZOFRAN) IV Push, sodium chlorideReview of Systems: Review of Systems -  Negative except as in HPI   Objective  Physical Exam: Vitals:Patient Vitals for the past 24 hrs: BP Temp Temp src Pulse Resp SpO2 Height Weight 01/01/23 0932 133/81 99 ?F (37.2 ?C) Oral 85 20 97 % -- -- 01/01/23 0400 -- -- -- 81 (!) 23 (!) 92 % -- -- 01/01/23 0200 (!) 163/74 -- -- -- -- 97 % -- -- 12/31/22 2354 Marland Kitchen)  149/67 99.1 ?F (37.3 ?C) Oral 83 20 94 % -- -- 12/31/22 1900 126/65 -- -- -- 18 95 % -- -- 12/31/22 1730 117/70 -- -- -- -- 94 % -- -- 12/31/22 1530 125/66 -- -- -- -- -- -- -- 12/31/22 1500 133/80 97.9 ?F (36.6 ?C) Oral -- -- 95 % -- -- 12/31/22 1445 139/71 -- -- -- 17 96 % -- -- 12/31/22 1415 134/78 -- -- -- -- 96 % -- -- 12/31/22 1400 130/83 -- -- -- -- -- -- -- 12/31/22 1345 137/80 -- -- -- -- 95 % -- -- 12/31/22 1336 (!) 149/81 98.6 ?F (37 ?C) -- 85 18 -- -- -- 12/31/22 1334 (!) 149/81 98.6 ?F (37 ?C) Oral -- 18 97 % -- -- 12/31/22 1200 (!) 176/73 -- -- (!) 92 (!) 23 96 % 5' 11 (1.803 m) 104.3 kg 12/31/22 1145 (!) 194/83 -- -- (!) 103 (!) 25 96 % -- -- 12/31/22 1100 (!) 202/139 -- -- (!) 92 (!) 51 97 % -- -- 12/31/22 1056 -- 97.2 ?F (36.2 ?C) Oral -- -- -- -- -- 12/31/22 1053 (!) 190/97 -- -- (!) 94 (!) 22 98 % -- -- Intake/Output:I/O last 3 completed shifts:In: 1600 [I.V.:1000; IV Piggyback:600]Out: - Physical Exam Constitutional: Anxious, ill appearing male, AAOX3, speaking full sentences. Eyes  : Not pale, anicteric scleraeENT : moist oral mucosaNeck: No JVD notedCardiovascular: RRR S1 S2, no m/r/gChest: CTAB, diminished at bases.  No rales.Abdominal: Large, BS hypo, soft, nt GU : No foley catheterExtremities:  Bilateral LE edema +3/+4, L>R, left leg red with cellulitic changesSkin : No rash on limited examNeurological:  Alert and oriented to person, place, and time. Psychiatric: DistressedReview of Labs/Diagnostics: Lab Review:Recent Labs Lab 01/07/241120 01/07/241550 01/07/242055 NA 129* 126* 129* K 3.9 3.6 3.5 CL 93* 91* 95* CO2 19* 16* 17* BUN 48* 51* 54* CREATININE 4.25* 4.06* 4.05* GLU 99 96 96 CALCIUM 8.1* 8.0* 7.4* MG 1.9  --   --  Recent Labs Lab 01/07/241137 WBC 11.8* HGB 13.2 HCT 38.50 PLT 113* No results for input(s): LABPROT, INR, PTT, DDIMER, FIBRINOGEN in the last 168 hours.Recent Labs Lab 01/07/241120 ALKPHOS 80 BILITOT 0.9 PROT 7.4 ALT 33 AST 42  No results for input(s): PHART, PCO2ART, PO2ART, O2SATART, BEART, HCO3ART in the last 168 hours.No results for input(s): CKMB in the last 168 hours.Invalid input(s): CPK  Signed: Claude Manges, APRN [facility] Kidney CenterOffice: [phone] 8, 202410:25 AM

## 2023-01-01 NOTE — ED Notes
12:02 PM ED to Floor HandoffAdmission Dx:     cellulitis         Telemetry: 	[]  Yes		[]  NoOxygen Tank on Stretcher >1000 PSI:  []  YesCode Status:   [x]  Full		[]  Partial		[]  No CodeIsolation: 	[x]  None	[]  Contact	[]  Droplet	[]  AirborneSafety Precautions: []  None	[]  Sitter   []  Restraints	[]  Suicidal	[x]  Fall Risk	Other (specify):           Mentation/Orientation:    A&O (person, place, time and situation) x      4    	 		Disoriented to:          Deficits: []  Hearing impaired	[]  Blind  	[]  Nonverbal		 []  ID/DD (intelectual disability/developmental disability)Ambulation: []  Ambulatory	[x]  Non-Ambulatory  Diet: [x]  OK to eat	[]  NPO	Other (specify):           IV Access: [x]  Yes   []  No     Vital signs Charted in the last hour: [x] Yes    []  No         Other (specify)            Skin Alteration:[]  Pressure Injury	[x]  Wound	[]  None	[]  Skin Not AssessedDiarrhea/Loose stool : []  1x within 24h  []  2x within 24h  []  3x within 24h  [x]  None      C.Diff Order:  []  Ordered- needs to be collected    []  Collected-sent to lab    []  Resulted - Negative C.Diff  []  Resulted - Positive C.Diff    []  Not Ordered   	[x]  N/APatient Belongings:Does the patient have belongings going to the floor?   [x] Yes    []  NoAre the belongings documented?          [x] Yes    []  NoIs someone taking belongings home?   [] Yes    []  No   Who (specify):           _________Gina Francoise Schaumann, RNPlease call the ED RN if you have any questions            You can always call the [facility] ED charge nurse at [phone] if you have any concerns [facility] ED [facility] Emergency Department

## 2023-01-01 NOTE — ED Notes
11:17 AM PT at bedside for evaluation.

## 2023-01-01 NOTE — Progress Notes
[facility]	Medicine Progress NoteAttending Provider: Loman Chroman * PCP: Chales Abrahams, PiyushInterim History: I have seen and examined the patient. Patients overnight issues reviewed with nursing. Chart reviewed. Examined patient , Reviewed electronic chart for Vitals,Labs,Diagnostics,MAR.Reviewed care notes, discussed with nursing and care coordination team .Reviewed previous notes- summation of old records,reviewed pertinent radiology reports, reviewed pertinent medicine tests .Established problems - see A&P (>4)Subjective: The patient is very weakReview of Allergies/Meds/Hx: I have reviewed the patient's: allergies, current scheduled medications, current infusions, current prn medications, past medical history and past surgical historyObjective: Vitals:  01/01/23 1802 BP: (!) 126/57 Pulse: 82 Resp: 16 Temp: 97.7 ?F (36.5 ?C) Weight: 112.6 kg  Intake/Output Summary (Last 24 hours) at 01/01/2023 1828Last data filed at 01/01/2023 1403Gross per 24 hour Intake 1950 ml Output -- Net 1950 ml  Physical Exam Constitutional: NADMouth/Throat: Oropharynx is clear and moist. Eyes: EOM are normal. Cardiovascular: Normal rate, regular rhythm and normal heart sounds.  No murmur.Pulmonary/Chest: Effort: normal; breath sounds: normal. No wheezing, no rales. Abdominal: Soft. Bowel sounds are normal,nontender,nondistended.Musculoskeletal:no acute inflammation, no tenderness. Neurological:alert and oriented to person, place, and time. Skin:  Left lower extremity cellulitisPsychiatric: normal mood and affect;behavior is normal. Labs:CBC & UJW:JXBJYN Labs Lab 01/07/241137 01/07/241550 01/07/242055 01/08/240904 01/08/241232 NA  --  126* 129*  --  130* K  --  3.6 3.5  --  3.2* CL  --  91* 95*  --  96* CO2  --  16* 17*  --  20* BUN  --  51* 54*  -- 55* CREATININE  --  4.06* 4.05*  --  4.15* GLU  --  96 96  --  110* CALCIUM  --  8.0* 7.4*  --  7.5* WBC 11.8*  --   --  9.8 8.7 HGB 13.2  --   --  10.0* 10.8* HCT 38.50  --   --  29.10* 30.70* PLT 113*  --   --  97* 93* Last 24 hours: Recent Results (from the past 24 hour(s)) Troponin T High Sensitivity, NO REFLEX  Collection Time: 12/31/22  8:55 PM Result Value Ref Range  High Sensitivity Troponin T 37 (H) See Comment ng/L Basic metabolic panel  Collection Time: 12/31/22  8:55 PM Result Value Ref Range  Sodium 129 (L) 136 - 145 mmol/L  Potassium 3.5 3.5 - 5.1 mmol/L  Chloride 95 (L) 98 - 107 mmol/L  CO2 17 (L) 21 - 32 mmol/L  Anion Gap 17 (H) 5 - 16  Glucose 96 70 - 100 mg/dL  BUN 54 (H) 7 - 18 mg/dL  Creatinine 8.29 (H) 5.62 - 1.30 mg/dL  Calcium 7.4 (L) 8.5 - 10.5 mg/dL  BUN/Creatinine Ratio 13.0 8.0 - 23.0  eGFR (Creatinine) 15 (L) >=60 mL/min/1.47m2 Vancomycin, random     (BH GH LMW YH)  Collection Time: 01/01/23  9:04 AM Result Value Ref Range  Vancomycin Random 17.4 Reference Range Not Established ug/mL CBC auto differential  Collection Time: 01/01/23  9:04 AM Result Value Ref Range  WBC 9.8 4.0 - 11.0 x1000/?L  RBC 3.26 (L) 4.00 - 6.00 M/?L  Hemoglobin 10.0 (L) 13.2 - 17.1 g/dL  Hematocrit 86.57 (L) 84.69 - 50.00 %  MCV 89.3 80.0 - 100.0 fL  MCH 30.7 27.0 - 33.0 pg  MCHC 34.4 31.0 - 36.0 g/dL  RDW-CV 62.9 52.8 - 41.3 %  Platelets 97 (L) 150 - 420 x1000/?L  MPV 11.5 8.0 - 12.0 fL  Neutrophils 83.6 (H) 39.0 - 72.0 %  Lymphocytes 6.8 (  L) 17.0 - 50.0 %  Monocytes 7.6 4.0 - 12.0 %  Eosinophils 0.4 0.0 - 5.0 %  Basophil 0.2 0.0 - 1.4 %  Immature Granulocytes 1.4 (H) 0.0 - 1.0 %  nRBC 0.0 0.0 - 1.0 %  ANC(Abs Neutrophil Count) 8.22 (H) 2.00 - 7.60 x 1000/?L  Absolute Lymphocyte Count 0.67 0.60 - 3.70 x 1000/?L  Monocyte Absolute Count 0.75 0.00 - 1.00 x 1000/?L  Eosinophil Absolute Count 0.04 0.00 - 1.00 x 1000/?L  Basophil Absolute Count 0.02 0.00 - 1.00 x 1000/?L  Absolute Immature Granulocyte Count 0.14 0.00 - 0.30 x 1000/?L  Absolute nRBC 0.00 0.00 - 1.00 x 1000/?L Immature Platelet Fraction [facility])  Collection Time: 01/01/23  9:04 AM Result Value Ref Range  Immature Platelet Fraction 7.8 1.2 - 8.6 %  Absolute Immature Platelet Fraction 7.6 <20.0 x1000/?L Echo 2D Complete w Doppler and CFI if Ind Image Enhancement 3D and or bubbles  Collection Time: 01/01/23 10:54 AM Result Value Ref Range  Reported Visual Range EF% 55-60 % Magnesium  Collection Time: 01/01/23 12:32 PM Result Value Ref Range  Magnesium 1.8 1.8 - 2.5 mg/dL Phosphorus     (BH GH L LMW YH)  Collection Time: 01/01/23 12:32 PM Result Value Ref Range  Phosphorus 3.6 2.5 - 4.9 mg/dL Hepatic function panel  Collection Time: 01/01/23 12:32 PM Result Value Ref Range  Total Bilirubin 0.6 0.2 - 1.2 mg/dL  Bilirubin, Direct 0.3 0.0 - 0.4 mg/dL  Alkaline Phosphatase 70 46 - 116 U/L  Alanine Aminotransferase (ALT) 33 14 - 63 U/L  Aspartate Aminotransferase (AST) 41 10 - 42 U/L  AST/ALT Ratio 1.2 Reference Range Not Established  Total Protein 6.4 6.0 - 8.0 g/dL  Albumin 2.0 (L) 3.4 - 5.0 g/dL  Globulin 4.4 (H) 2.3 - 3.5 g/dL  A/G Ratio 0.5 (L) 1.0 - 2.2 CBC auto differential  Collection Time: 01/01/23 12:32 PM Result Value Ref Range  WBC 8.7 4.0 - 11.0 x1000/?L  RBC 3.43 (L) 4.00 - 6.00 M/?L  Hemoglobin 10.8 (L) 13.2 - 17.1 g/dL  Hematocrit 14.78 (L) 29.56 - 50.00 %  MCV 89.5 80.0 - 100.0 fL  MCH 31.5 27.0 - 33.0 pg  MCHC 35.2 31.0 - 36.0 g/dL  RDW-CV 21.3 08.6 - 57.8 %  Platelets 93 (L) 150 - 420 x1000/?L  MPV 11.4 8.0 - 12.0 fL  Neutrophils 82.3 (H) 39.0 - 72.0 %  Lymphocytes 6.3 (L) 17.0 - 50.0 %  Monocytes 7.2 4.0 - 12.0 %  Eosinophils 0.8 0.0 - 5.0 %  Basophil 0.3 0.0 - 1.4 %  Immature Granulocytes 3.1 (H) 0.0 - 1.0 %  nRBC 0.0 0.0 - 1.0 % ANC(Abs Neutrophil Count) 7.13 2.00 - 7.60 x 1000/?L  Absolute Lymphocyte Count 0.55 (L) 0.60 - 3.70 x 1000/?L  Monocyte Absolute Count 0.62 0.00 - 1.00 x 1000/?L  Eosinophil Absolute Count 0.07 0.00 - 1.00 x 1000/?L  Basophil Absolute Count 0.03 0.00 - 1.00 x 1000/?L  Absolute Immature Granulocyte Count 0.27 0.00 - 0.30 x 1000/?L  Absolute nRBC 0.00 0.00 - 1.00 x 1000/?L Basic metabolic panel  Collection Time: 01/01/23 12:32 PM Result Value Ref Range  Sodium 130 (L) 136 - 145 mmol/L  Potassium 3.2 (L) 3.5 - 5.1 mmol/L  Chloride 96 (L) 98 - 107 mmol/L  CO2 20 (L) 21 - 32 mmol/L  Anion Gap 14 5 - 16  Glucose 110 (H) 70 - 100 mg/dL  BUN 55 (H) 7 - 18 mg/dL  Creatinine 4.69 (  H) 0.80 - 1.30 mg/dL  Calcium 7.5 (L) 8.5 - 10.5 mg/dL  BUN/Creatinine Ratio 84.1 8.0 - 23.0  eGFR (Creatinine) 15 (L) >=60 mL/min/1.37m2 Immature Platelet Fraction (BH GH YH)  Collection Time: 01/01/23 12:32 PM Result Value Ref Range  Immature Platelet Fraction 7.2 1.2 - 8.6 %  Absolute Immature Platelet Fraction 6.7 <20.0 x1000/?L Sodium, urine, random w/o creatinine  Collection Time: 01/01/23  1:53 PM Result Value Ref Range  Sodium, Urine Random 31 Reference Range Not Established mmol/L Osmolality, urine, random  Collection Time: 01/01/23  1:53 PM Result Value Ref Range  Osmolality, Urine, Random 401 300 - 900 mOsm/kg Echo 2D Complete w Doppler and CFI if Ind Image Enhancement 3D and or bubbles Final Result  US Renal Final Result    Unremarkable appearing renal ultrasound  Reported and signed by: Donivan Scull, MD    [facility] Radiology and Biomedical Imaging   US Duplex Lower Extremity Venous Bilat Final Result   No evidence of deep venous thrombosis in bilateral femoral/popliteal veins, within above limitations.  Please note that ultrasound is less sensitive for evaluation of thrombus in calf veins. If there is continued clinical concern, re-evaluation can be performed after 5-7 days to evaluate for propagation of an unseen thrombus from the calf.  [place] [facility] Notify System Classification: Routine.  Report initiated by:  Vincenza Hews, MD  Reported and signed by: Alberteen Sam, MD    [facility] Radiology and Biomedical Imaging   [place] ED Chest Abdomen Pelvis wo IV Contrast Final Result   Evidence of chronic large airways disease/aspiration including endotracheal debris, linear bibasilar scarring, and diffuse bronchial wall thickening.  No evidence of acute abnormality within the chest, abdomen, or pelvis.  [place] [facility] Notify System Classification: Routine.  Report initiated by:  Vincenza Hews, MD  Reported and signed by: Alberteen Sam, MD    [facility] Radiology and Biomedical Imaging   [place] Head Cervical Spine wo IV Contrast Final Result 1. No evidence of acute intracranial abnormality.   2. No evidence for acute cervical spine fracture or traumatic subluxation.  Please note that Noncontrast Head [place] is not sensitive for the detection of ischemic infarct. If ischemic infarct is of clinical concern, additional clinical or imaging evaluation is recommended.   [place] [facility] Notify System Classification: Routine.  Report initiated by:  Vincenza Hews, MD  Reported and signed by: Lavera Guise, MD    [facility] Radiology and Biomedical Imaging   CXR Final Result   No acute cardiothoracic abnormality.  [place] Rad[facility] Notify System Classification: Routine.  Reported and signed by: Alberteen Sam, MD    [facility] Radiology and Biomedical Imaging   Echo 2D Complete w Doppler and CFI if Ind Image Enhancement 3D and or bubblesResult Date: 01/01/2023~ * Very limited study.* Suboptimal visualization of the left ventricle, but it appears to be normal size with normal function.  LVEF estimated by visual assessment was between 55-60%.  Cannot rule out wall motion abnormalities.* Normal right ventricular cavity size and systolic function.* No significant valve abnormalities on this limited study.US RenalResult Date: 1/8/2024RENAL ULTRASOUND WITH DOPPLER HISTORY:  aki. COMPARISON:   Ultrasound 11/13/2021 TECHNIQUE:  Multiple longitudinal and transverse images of the kidneys were performed using a high-resolution linear array transducer. FINDINGS: BLADDER:  The urinary bladder is incompletely distended. The prostate gland is not enlarged.  Bilateral ureteral jets are identified. RIGHT KIDNEY:  Right kidney measures 8.6 cm in length.  The renal echogenicity appears unremarkable. No stones, hydronephrosis or suspicious appearing mass lesions are identified.  There are no perinephric collections. LEFT KIDNEY:  Left kidney measures 11.3  cm in length.  The renal echogenicity appears unremarkable. No stones, hydronephrosis or suspicious appearing mass lesions are identified. There are no perinephric collections.  DOPPLER:    Renal vascular color flow is identified    Unremarkable appearing renal ultrasound Reported and signed by: Donivan Scull, MD  [facility] Radiology and Biomedical Imaging [place] ED Chest Abdomen Pelvis wo IV ContrastResult Date: 1/7/2024CT ED CHEST ABDOMEN PELVIS WO IV CONTRAST HISTORY/INDICATION: Dyspnea status post fall. COMPARISON: NONE TECHNIQUE: [place] images of the chest, abdomen, and pelvis were obtained from the thoracic inlet to the pubic symphysis without intravenous contrast. Coronal and sagittal multiplanar reformatted images of the chest, abdomen and pelvis were provided. FINDINGS: CLAVICLES & SCAPULAE: Unremarkable RIB CAGE & STERNUM: No acute osseous injury. MEDIASTINUM: Unremarkable. LUNGS/AIRWAYS/PLEURA: Evidence of the large airways disease/possible chronic aspiration including mild diffuse bronchial wall thickening, linear bibasilar scarring, and a small amount of endotracheal debris is seen just proximal to the origin of the right mainstem bronchus (image 193 series 4). VASCULATURE: The aorta and its major branches are unremarkable. LYMPH NODES: No enlarged lymph nodes. LIVER & BILIARY: Unremarkable. SPLEEN: Unremarkable. PANCREAS: Unremarkable. ADRENAL GLANDS: Unremarkable. KIDNEYS: Atrophic right kidney.Marland Kitchen Exophytic left renal cyst. BOWEL: Unremarkable. URINARY BLADDER: Unremarkable. PELVIC VISCERA: Unremarkable. VASCULATURE: IVC filter. THORACIC SPINE: No acute osseous injury. LUMBAR SPINE: No acute osseous injury. Chronic appearing grade 1 spondylolisthesis of L5 over S1. Chronic bilateral L5 pars defects. SACRUM & PELVIS: Status post right hip arthroplasty. Severe degenerative changes noted at the left hip. SOFT TISSUES: Bilateral gynecomastia.  Evidence of chronic large airways disease/aspiration including endotracheal debris, linear bibasilar scarring, and diffuse bronchial wall thickening. No evidence of acute abnormality within the chest, abdomen, or pelvis. [place] [facility] Notify System Classification: Routine. Report initiated by:  Vincenza Hews, MD Reported and signed by: Alberteen Sam, MD  [facility] Radiology and Biomedical Imaging US Duplex Lower Extremity Venous BilatResult Date: 1/7/2024US DUPLEX LOWER EXTREMITY VENOUS BILATERAL. HISTORY: Pain and swelling COMPARISON: None. TECHNIQUE: Grayscale, color and pulsed Doppler imaging were performed.  FINDINGS: There is symmetric phasicity in the lower external iliac veins. RIGHT LOWER EXTREMITY: COMMON FEMORAL VEIN: No thrombus. GREAT SAPHENOUS ORIGIN: No thrombus. UPPER PROFUNDA FEMORAL VEIN: No thrombus. FEMORAL VEIN: No thrombus. POPLITEAL VEIN: No thrombus. Appropriate augmentation to flow is noted in the popliteal vein. TIBIOPERONEAL TRUNK: No thrombus. POSTERIOR TIBIAL VEINS: Not well visualized. PERONEAL VEINS: Not well visualized.  LEFT LOWER EXTREMITY: COMMON FEMORAL VEIN: No thrombus. GREAT SAPHENOUS ORIGIN: No thrombus. UPPER PROFUNDA FEMORAL VEIN: No thrombus. FEMORAL VEIN: No thrombus. POPLITEAL VEIN: No thrombus. Appropriate augmentation to flow is noted in the popliteal vein. TIBIOPERONEAL TRUNK: Not well visualized. POSTERIOR TIBIAL VEINS: Not well visualized.   PERONEAL VEINS: Not well visualized.     No evidence of deep venous thrombosis in bilateral femoral/popliteal veins, within above limitations. Please note that ultrasound is less sensitive for evaluation of thrombus in calf veins. If there is continued clinical concern, re-evaluation can be performed after 5-7 days to evaluate for propagation of an unseen thrombus from the calf. [place] Radiology Notify System Classification: Routine. Report initiated by:  Vincenza Hews, MD Reported and signed by: Alberteen Sam, MD  [facility] Radiology and Biomedical Imaging [place] Head Cervical Spine wo IV ContrastResult Date: 1/7/2024CT HEAD CERVICAL SPINE WO IV CONTRAST (BH Y[facility]) INDICATION: Fall with head strike COMPARISON: None TECHNIQUE:  images were obtained from the vertex through T1 without intravenous contrast. Coronal and sagittal  multiplanar reformatted images were provided. FINDINGS: Brain: There is no intracranial hemorrhage, edema, mass, mass effect or midline shift. There is no evidence of acute major vascular distribution infarct. Periventricular white matter hypodensity is nonspecific, though likely related to chronic small vessel ischemia. The ventricles and sulci are symmetrically enlarged, likely related to diffuse parenchymal volume loss.. The basal cisterns are patent. The paranasal sinuses and mastoid air cells are clear. The visualized orbits and osseous structures are unremarkable, noting left cataract surgery. Cervical Spine: There is no compression deformity or evidence for acute fracture or subluxation. The atlanto-occipital and atlanto-axial articulations are intact. Note is made of multilevel degenerative disc disease changes. 1. No evidence of acute intracranial abnormality.  2. No evidence for acute cervical spine fracture or traumatic subluxation. Please note that Noncontrast Head [place] is not sensitive for the detection of ischemic infarct. If ischemic infarct is of clinical concern, additional clinical or imaging evaluation is recommended. [place] Radiology Notify System Classification: Routine. Report initiated by:  Vincenza Hews, MD Reported and signed by: Lavera Guise, MD  [facility] Radiology and Biomedical Imaging CXRResult Date: 1/7/2024XR CHEST PA AND LATERAL INDICATION: fall, weakness COMPARISON: XR CHEST PA OR AP 2022-04-10 FINDINGS:   The cardiomediastinal silhouette is unchanged. Lungs are hypoinflated. No focal consolidation. Mild dependent atelectasis. The pleural spaces are clear. There is no acute osseous injury.  No acute cardiothoracic abnormality. [place] Radiology Notify System Classification: Routine. Reported and signed by: Alberteen Sam, MD  [facility] Radiology and Biomedical Imaging I have reviewed the patient's ECG as resulted in the EMR.Assessment and Plan: Reviewed care notes, discussed with nursing and care coordination team .Reviewed consult notes .>30 mins in review of chart and exam of pt and preparation of note, discussion w team members, > 50% direct care with patient41 year old male with a past medical histories of obesity hypertension presenting with hypertensive urgency and lower extremity edema and left lower extremity cellulitis as well as acute kidney injury.# left lower extremity cellulitisOn Unasyn vancomycin awaiting culture and sensitivityPain Management# acute kidney injury, hypokalemiaUltrasound with no obstruction or hydronephrosisReceived the potassium chloride oral will recheck electrolytes in a.m.# hypertensive urgency resolvedContinue amlodipine hydralazine Toprol Crestor# obesity stage IIINutrition,DVT prophylaxis in placeNo ACLS There is no immunization history on file for this patient.Signed:have reviewed the consultants recommendations and plan of care. Care was discussed with the nursing and case management teams. The patient was updated regarding the illness, plan of care and disposition plan. All orders were entered electronically. Dinah Beers MD.    [phone] No CodePortions of this document may have been transcribed using speech recognition software. Despite attempts at proofreading this document may contain transcription errors. Please do not hesitate to contact this writer to request clarification.

## 2023-01-01 NOTE — Plan of Care
Inpatient Physical Therapy Evaluation IP Adult PT Eval/Treat - 01/01/23 1132    Date of Visit / Treatment  Date of Visit / Treatment 01/01/23   Note Type Evaluation   Start Time 1115   End Time 1132   Total Treatment Time 17    General Information  Pertinent History Of Current Problem Pt is a 72 y/o male presenting with cellulitis and a report of a fall in the parking lot of the hositpal. Pt displays with LLE edema and cellulitis. Pt has a PMH consisting of HTN   Subjective It hurts   Other Providers PT   General Observations Chart reviewed. Discussed with RN Renzuli pre and post session. Pt presented supine on stretched with +monitoring equipment. Pt agreeable to work with PT   Precautions/Limitations Fall Precautions    Weight Bearing Status  Weight Bearing Status WNL - Within normal limits    Prior Level of Functioning/Social History  Prior Level of Function independent with mobility   Patient resides with: Alone   Type of Home Apartment   Home Setup Steps to enter with rail(s)   Equipment Utilized Prior to Admission/Treatment No device    Vital Signs and Orthostatic Vital Signs  Vital Signs Vital Signs Stable    Pain/Comfort  Location #1 - PreTreatment Rating (Numbers Scale) 7/10   Posttreatment Rating (Numbers Scale) 10/10   Pain Location - Side Left   Pain Location leg   Pain Comment (Pre/Post Treatment Pain) RN notified about pain    Patient Coping  Observed Emotional State cooperative   Verbalized Emotional State acceptance    Cognition  Overall Cognitive Status WFL   Orientation Level Oriented X4   Level of Consciousness WFL - Within functional limits   Following Commands WFL - Within functional limits   Personal Safety / Judgment WFL - Within functional limits    Range of Motion  Range of Motion Examination RLE ROM was [facility]   Range of Motion Comments LLE differed due to pain    Manual Muscle Testing Manual Muscle Testing Results WFL except   Manual Muscle Testing Comments DF 5/5. PF 5/5 both elicit increased levels of pain. LLE differed due to pain    Sensory Assessment  Sensory Tests Results Unable to assess    Skin Assessment  Skin Assessment See Nursing Documentation    Balance  Sitting Balance: Static  GOOD-    Maintains static position against minimal resistance with no Assistive Device   Sitting Balance: Dynamic  FAIR+    Performs dynamic activities through full range with Supervision    Bed Mobility  Symptoms Noted During/After Treatment increased pain   Rolling/Turning Right - Independence/Assistance Level --   Rolling/Turning Left - Independence/Assistance Level --   Rolling/Turning Assist Device --   Supine-to-Sit Independence/Assistance Level Minimum assist   Supine-to-Sit Assist Device Bed rails;Hand held assist;Head of bed elevated   Sit-to-Supine Independence/Assistance Level Modified independence   Sit-to-Supine Assist Device Head of bed elevated   Limitations decreased ability to use legs for bridging/pushing;impaired ability to control trunk for mobility   Bed Mobility, Impairments pain   Bed mobility was limited by: pain   Bed Mobility Comments Patient was able to initiate bed mobility, however he was significantly limited secondary to pain, unable to fully obtain sitting upright at EOB. Additionally, patient able to initiate rolling at trunk only but not complete task due to pain.    Sit-Stand Transfer Training  Stand-to-Sit Transfer Independence/Assistance Level Unable to assess  Gait Training  Independence/Assistance Level  Unable to assess    Stair Performance  Independence/Assistance Level  Not tested    Handoff Documentation  Handoff Patient in bed;Patient instructed to call nursing for mobility;Discussed with nursing   Handoff Comments Updated RN about pt status    PT- AM-PAC - Basic Mobility Screen- How much help from another person do you currently need.....  Turning from your back to your side while in a a flat bed without using rails? 3 - A Little - Requires a little help (supervision, minimal assistance). Can use assistive devices.   Moving from lying on your back to sitting on the side of a flat bed without using bed rails? 1 - Total - Requires total assistance or cannot do it at all.   Moving to and from a bed to a chair (including a wheelchair)? 1 - Total - Requires total assistance or cannot do it at all.   Standing up from a chair using your arms(e.g., wheelchair or bedside chair)? 1 - Total - Requires total assistance or cannot do it at all.   To walk in a hospital room? 1 - Total - Requires total assistance or cannot do it at all.   Climbing 3-5 steps with a railing? 1 - Total - Requires total assistance or cannot do it at all.   AMPAC Mobility Score 8   TARGET Highest Level of Mobility Mobility Level 3, Sit on edge of bed   ACTUAL Highest Level of Mobility Mobility Level 1, Lying in bed    Clinical Impression  Initial Assessment Pt is a 72 y/o male presenting in the ED with cellulitis secondary to impairments with strength, mobilty, and pain. Pt was unable to tolerate session well due to increase levels of pain. When attempted to mobilize pt by having them sit at EOB, pain levels increased to 10/10 eliciting high levels of muscle guarding and noted to be tremulous. Pt was returned to bed and RN notified about pain status.   Criteria for Skilled Therapeutic Interventions Met yes;treatment indicated   Impairments Found (describe specific impairments) gait;mobility;pain   Functional Limitations in Following Categories (Describe Specific Limitations) mobility   Rehab Potential good, to achieve stated therapy goals    Patient/Family Stated Goals  Patient/Family Stated Goal(s) return home;return to prior level of functioning;decrease pain    Frequency/Equipment Recommendations  PT Frequency 4x per week   Mon  What day of week is next treatment expected? Tuesday    Planned Treatment / Interventions  Plan for Next Visit Progress mobility   General Treatment / Interventions Aerobic Capacity / Endurance Conditioning   Training Treatment / Interventions Balance / Gait Training   Education Treatment / Interventions Patient Education / Training    PT Discharge Summary  Physical Therapy Disposition Recommendation Inpatient Rehab   Equipment Recommendations for Discharge Patient has all necessary durable medical equipment     Problem: Physical Therapy GoalsGoal: Physical Therapy GoalsDescription: Pt will complete bed mobility with modified (I) Pt will complete sit to stand transfers with RW and supervision Pt will ambulate 50 ft with RW and supervision Outcome: Interventions implemented as appropriate Alinda Money Le,SPT Co-signed by Minerva Ends PT, DPT

## 2023-01-02 LAB — VANCOMYCIN, RANDOM     (BH GH LMW YH): BKR VANCOMYCIN RANDOM: 11.7 ug/mL

## 2023-01-02 LAB — HEPATIC FUNCTION PANEL
BKR A/G RATIO: 0.4 mmol/L — ABNORMAL LOW (ref 1.0–2.2)
BKR ALANINE AMINOTRANSFERASE (ALT): 48 U/L (ref 14–63)
BKR ALBUMIN: 1.6 g/dL — ABNORMAL LOW (ref 3.4–5.0)
BKR ALKALINE PHOSPHATASE: 80 U/L (ref 46–116)
BKR ASPARTATE AMINOTRANSFERASE (AST): 69 U/L — ABNORMAL HIGH (ref 10–42)
BKR AST/ALT RATIO: 1.4 x1000/??L (ref 4.0–11.0)
BKR BILIRUBIN DIRECT: 0.3 mg/dL (ref 0.0–0.4)
BKR BILIRUBIN TOTAL: 0.5 mg/dL (ref 0.2–1.2)
BKR GLOBULIN: 4.1 g/dL — ABNORMAL HIGH (ref 2.3–3.5)
BKR PROTEIN TOTAL: 5.7 g/dL — ABNORMAL LOW (ref 6.0–8.0)

## 2023-01-02 LAB — CBC WITH AUTO DIFFERENTIAL
BKR PROCALCITONIN: 88 x1000/??L — ABNORMAL LOW (ref 150–420)
BKR WAM ABSOLUTE NRBC (2 DEC): 0 x 1000/??L — ABNORMAL LOW (ref 0.00–1.00)
BKR WAM ANALYZER ANC: 5.98 x 1000/ÂµL (ref 2.00–7.60)
BKR WAM HEMATOCRIT (2 DEC): 27.2 % — ABNORMAL LOW (ref 38.50–50.00)
BKR WAM HEMOGLOBIN: 9.3 g/dL — ABNORMAL LOW (ref 13.2–17.1)
BKR WAM MCH (PG): 30.7 pg — ABNORMAL LOW (ref 27.0–33.0)
BKR WAM MCHC: 34.2 g/dL (ref 31.0–36.0)
BKR WAM MCV: 89.8 fL (ref 80.0–100.0)
BKR WAM MPV: 12.3 fL — ABNORMAL HIGH (ref 8.0–12.0)
BKR WAM NUCLEATED RED BLOOD CELLS: 0 % — CL (ref 0.0–1.0)
BKR WAM PLATELETS: 88 x1000/ÂµL — ABNORMAL LOW (ref 150–420)
BKR WAM RED BLOOD CELL COUNT.: 3.03 M/??L — ABNORMAL LOW (ref 4.00–6.00)
BKR WAM WHITE BLOOD CELL COUNT: 7.8 x1000/ÂµL (ref 4.0–11.0)

## 2023-01-02 LAB — BASIC METABOLIC PANEL
BKR ANION GAP: 13 g/dL (ref 5–16)
BKR BLOOD UREA NITROGEN: 52 mg/dL — ABNORMAL HIGH (ref 7–18)
BKR BUN / CREAT RATIO: 12.1 x 1000/??L (ref 8.0–23.0)
BKR CALCIUM: 6.6 mg/dL — CL (ref 8.5–10.5)
BKR CHLORIDE: 98 mmol/L (ref 98–107)
BKR CO2: 20 mmol/L — ABNORMAL LOW (ref 21–32)
BKR CREATININE: 4.3 mg/dL — ABNORMAL HIGH (ref 0.80–1.30)
BKR EGFR, CREATININE (CKD-EPI 2021): 14 mL/min/{1.73_m2} — ABNORMAL LOW (ref >=60)
BKR GLUCOSE: 142 mg/dL — ABNORMAL HIGH (ref 70–100)
BKR POTASSIUM: 3 mmol/L — ABNORMAL LOW (ref 3.5–5.1)
BKR SODIUM: 131 mmol/L — ABNORMAL LOW (ref 136–145)
BKR WAM RDW-CV: 142 mg/dL — ABNORMAL HIGH (ref 70–100)

## 2023-01-02 LAB — MAGNESIUM: BKR MAGNESIUM: 1.7 mg/dL — ABNORMAL LOW (ref 1.8–2.5)

## 2023-01-02 LAB — OSMOLALITY: BKR OSMOLALITY: 287 mOsm/kg (ref 275–300)

## 2023-01-02 LAB — PHOSPHORUS     (BH GH L LMW YH): BKR PHOSPHORUS: 3.8 mg/dL (ref 2.5–4.9)

## 2023-01-02 MED ORDER — OXYCODONE IMMEDIATE RELEASE 5 MG TABLET
5 mg | Freq: Four times a day (QID) | ORAL | Status: CP | PRN
Start: 2023-01-02 — End: ?
  Administered 2023-01-02 – 2023-01-05 (×5): 5 mg via ORAL

## 2023-01-02 MED ORDER — POTASSIUM CHLORIDE ER 20 MEQ TABLET,EXTENDED RELEASE(PART/CRYST)
20 MEQ | Freq: Once | ORAL | Status: CP
Start: 2023-01-02 — End: ?
  Administered 2023-01-02: 14:00:00 20 MEQ via ORAL

## 2023-01-02 MED ORDER — VANCOMYCIN 1.5 G IN 500 ML IVPB (VIALMATE)
Freq: Once | INTRAVENOUS | Status: CP
Start: 2023-01-02 — End: ?
  Administered 2023-01-02: 16:00:00 500.000 mL/h via INTRAVENOUS

## 2023-01-02 NOTE — Progress Notes
[facility]	Medicine Progress NoteAttending Provider: Loman Chroman * PCP: Chales Abrahams, PiyushInterim History: I have seen and examined the patient. Patients overnight issues reviewed with nursing. Chart reviewed. Examined patient , Reviewed electronic chart for Vitals,Labs,Diagnostics,MAR.Reviewed care notes, discussed with nursing and care coordination team .Reviewed previous notes- summation of old records,reviewed pertinent radiology reports, reviewed pertinent medicine tests .Established problems - see A&P (>4)Subjective: The patient is very weak refusing pain meds Review of Allergies/Meds/Hx: I have reviewed the patient's: allergies, current scheduled medications, current infusions, current prn medications, past medical history and past surgical historyObjective: Vitals:  01/02/23 1318 BP: (!) 154/71 Pulse: (!) 91 Resp: 18 Temp: 97.9 ?F (36.6 ?C) Weight:   Intake/Output Summary (Last 24 hours) at 01/02/2023 1656Last data filed at 01/02/2023 1512Gross per 24 hour Intake 600 ml Output 750 ml Net -150 ml  Physical Exam Constitutional: NADMouth/Throat: Oropharynx is clear and moist. Eyes: EOM are normal. Cardiovascular: Normal rate, regular rhythm and normal heart sounds.  No murmur.Pulmonary/Chest: Effort: normal; breath sounds: normal. No wheezing, no rales. Abdominal: Soft. Bowel sounds are normal,nontender,nondistended.Musculoskeletal:no acute inflammation, no tenderness. Neurological:alert and oriented to person, place, and time. Skin:  Left lower extremity cellulitisPsychiatric: normal mood and affect;behavior is normal. Labs:CBC & WFU:XNATFT Labs Lab 01/07/242055 01/08/240904 01/08/241232 01/09/240512 01/09/240513 NA 129*  --  130*  --  131* K 3.5  --  3.2*  --  3.0* CL 95*  --  96*  --  98 CO2 17*  --  20*  --  20* BUN 54*  --  55*  --  52* CREATININE 4.05*  --  4.15*  --  4.30* GLU 96  --  110*  --  142* CALCIUM 7.4*  --  7.5*  --  6.6* WBC  --  9.8 8.7 7.8  --  HGB  --  10.0* 10.8* 9.3*  --  HCT  --  29.10* 30.70* 27.20*  --  PLT  --  97* 93* 88*  --  Last 24 hours: Recent Results (from the past 24 hour(s)) CBC auto differential  Collection Time: 01/02/23  5:12 AM Result Value Ref Range  WBC 7.8 4.0 - 11.0 x1000/?L  RBC 3.03 (L) 4.00 - 6.00 M/?L  Hemoglobin 9.3 (L) 13.2 - 17.1 g/dL  Hematocrit 73.22 (L) 02.54 - 50.00 %  MCV 89.8 80.0 - 100.0 fL  MCH 30.7 27.0 - 33.0 pg  MCHC 34.2 31.0 - 36.0 g/dL  RDW-CV 27.0 62.3 - 76.2 %  Platelets 88 (L) 150 - 420 x1000/?L  MPV 12.3 (H) 8.0 - 12.0 fL  nRBC 0.0 0.0 - 1.0 %  ANC(Abs Neutrophil Count) 5.98 2.00 - 7.60 x 1000/?L  Absolute nRBC 0.00 0.00 - 1.00 x 1000/?L Magnesium  Collection Time: 01/02/23  5:13 AM Result Value Ref Range  Magnesium 1.7 (L) 1.8 - 2.5 mg/dL Phosphorus     (BH GH L LMW YH)  Collection Time: 01/02/23  5:13 AM Result Value Ref Range  Phosphorus 3.8 2.5 - 4.9 mg/dL Hepatic function panel  Collection Time: 01/02/23  5:13 AM Result Value Ref Range  Total Bilirubin 0.5 0.2 - 1.2 mg/dL  Bilirubin, Direct 0.3 0.0 - 0.4 mg/dL  Alkaline Phosphatase 80 46 - 116 U/L  Alanine Aminotransferase (ALT) 48 14 - 63 U/L  Aspartate Aminotransferase (AST) 69 (H) 10 - 42 U/L  AST/ALT Ratio 1.4 Reference Range Not Established  Total Protein 5.7 (L) 6.0 - 8.0 g/dL  Albumin 1.6 (L) 3.4 - 5.0 g/dL  Globulin 4.1 (  H) 2.3 - 3.5 g/dL  A/G Ratio 0.4 (L) 1.0 - 2.2 Basic metabolic panel  Collection Time: 01/02/23  5:13 AM Result Value Ref Range  Sodium 131 (L) 136 - 145 mmol/L  Potassium 3.0 (L) 3.5 - 5.1 mmol/L  Chloride 98 98 - 107 mmol/L  CO2 20 (L) 21 - 32 mmol/L  Anion Gap 13 5 - 16  Glucose 142 (H) 70 - 100 mg/dL  BUN 52 (H) 7 - 18 mg/dL  Creatinine 1.61 (H) 0.96 - 1.30 mg/dL Calcium 6.6 (LL) 8.5 - 10.5 mg/dL  BUN/Creatinine Ratio 04.5 8.0 - 23.0  eGFR (Creatinine) 14 (L) >=60 mL/min/1.39m2 Osmolality  Collection Time: 01/02/23  5:13 AM Result Value Ref Range  Osmolality 287 275 - 300 mOsm/kg Vancomycin, random     (BH GH LMW YH)  Collection Time: 01/02/23  5:13 AM Result Value Ref Range  Vancomycin Random 11.7 Reference Range Not Established ug/mL Echo 2D Complete w Doppler and CFI if Ind Image Enhancement 3D and or bubbles Final Result  US Renal Final Result    Unremarkable appearing renal ultrasound  Reported and signed by: Donivan Scull, MD    [facility] Radiology and Biomedical Imaging   US Duplex Lower Extremity Venous Bilat Final Result   No evidence of deep venous thrombosis in bilateral femoral/popliteal veins, within above limitations.  Please note that ultrasound is less sensitive for evaluation of thrombus in calf veins. If there is continued clinical concern, re-evaluation can be performed after 5-7 days to evaluate for propagation of an unseen thrombus from the calf.  [place] Radiology Notify System Classification: Routine.  Report initiated by:  Vincenza Hews, MD  Reported and signed by: Alberteen Sam, MD    [facility] Radiology and Biomedical Imaging   [place] ED Chest Abdomen Pelvis wo IV Contrast Final Result   Evidence of chronic large airways disease/aspiration including endotracheal debris, linear bibasilar scarring, and diffuse bronchial wall thickening.  No evidence of acute abnormality within the chest, abdomen, or pelvis.  [place] Radiology Notify System Classification: Routine.  Report initiated by:  Vincenza Hews, MD  Reported and signed by: Alberteen Sam, MD    [facility] Radiology and Biomedical Imaging   [place] Head Cervical Spine wo IV Contrast Final Result 1. No evidence of acute intracranial abnormality.   2. No evidence for acute cervical spine fracture or traumatic subluxation.  Please note that Noncontrast Head  is not sensitive for the detection of ischemic infarct. If ischemic infarct is of clinical concern, additional clinical or imaging evaluation is recommended.    Radiology Notify System Classification: Routine.  Report initiated by:  Vincenza Hews, MD  Reported and signed by: Lavera Guise, MD    [facility] Radiology and Biomedical Imaging   CXR Final Result   No acute cardiothoracic abnormality.   Radiology Notify System Classification: Routine.  Reported and signed by: Alberteen Sam, MD    [facility] Radiology and Biomedical Imaging   Echo 2D Complete w Doppler and CFI if Ind Image Enhancement 3D and or bubblesResult Date: 01/01/2023~ * Very limited study.* Suboptimal visualization of the left ventricle, but it appears to be normal size with normal function.  LVEF estimated by visual assessment was between 55-60%.  Cannot rule out wall motion abnormalities.* Normal right ventricular cavity size and systolic function.* No significant valve abnormalities on this limited study.US RenalResult Date: 1/8/2024RENAL ULTRASOUND WITH DOPPLER HISTORY:  aki. COMPARISON:   Ultrasound 11/13/2021 TECHNIQUE:  Multiple longitudinal and transverse images of the kidneys were performed using a high-resolution  linear array transducer. FINDINGS: BLADDER:  The urinary bladder is incompletely distended. The prostate gland is not enlarged.  Bilateral ureteral jets are identified. RIGHT KIDNEY:  Right kidney measures 8.6 cm in length.  The renal echogenicity appears unremarkable. No stones, hydronephrosis or suspicious appearing mass lesions are identified. There are no perinephric collections. LEFT KIDNEY:  Left kidney measures 11.3  cm in length.  The renal echogenicity appears unremarkable. No stones, hydronephrosis or suspicious appearing mass lesions are identified. There are no perinephric collections.  DOPPLER: Renal vascular color flow is identified    Unremarkable appearing renal ultrasound Reported and signed by: Donivan Scull, MD  [facility] Radiology and Biomedical Imaging [place] ED Chest Abdomen Pelvis wo IV ContrastResult Date: 1/7/2024CT ED CHEST ABDOMEN PELVIS WO IV CONTRAST HISTORY/INDICATION: Dyspnea status post fall. COMPARISON: NONE TECHNIQUE: [place] images of the chest, abdomen, and pelvis were obtained from the thoracic inlet to the pubic symphysis without intravenous contrast. Coronal and sagittal multiplanar reformatted images of the chest, abdomen and pelvis were provided. FINDINGS: CLAVICLES & SCAPULAE: Unremarkable RIB CAGE & STERNUM: No acute osseous injury. MEDIASTINUM: Unremarkable. LUNGS/AIRWAYS/PLEURA: Evidence of the large airways disease/possible chronic aspiration including mild diffuse bronchial wall thickening, linear bibasilar scarring, and a small amount of endotracheal debris is seen just proximal to the origin of the right mainstem bronchus (image 193 series 4). VASCULATURE: The aorta and its major branches are unremarkable. LYMPH NODES: No enlarged lymph nodes. LIVER & BILIARY: Unremarkable. SPLEEN: Unremarkable. PANCREAS: Unremarkable. ADRENAL GLANDS: Unremarkable. KIDNEYS: Atrophic right kidney.Marland Kitchen Exophytic left renal cyst. BOWEL: Unremarkable. URINARY BLADDER: Unremarkable. PELVIC VISCERA: Unremarkable. VASCULATURE: IVC filter. THORACIC SPINE: No acute osseous injury. LUMBAR SPINE: No acute osseous injury. Chronic appearing grade 1 spondylolisthesis of L5 over S1. Chronic bilateral L5 pars defects. SACRUM & PELVIS: Status post right hip arthroplasty. Severe degenerative changes noted at the left hip. SOFT TISSUES: Bilateral gynecomastia.  Evidence of chronic large airways disease/aspiration including endotracheal debris, linear bibasilar scarring, and diffuse bronchial wall thickening. No evidence of acute abnormality within the chest, abdomen, or pelvis. [place] Radiology Notify System Classification: Routine. Report initiated by:  Vincenza Hews, MD Reported and signed by: Alberteen Sam, MD  [facility] Radiology and Biomedical Imaging US Duplex Lower Extremity Venous BilatResult Date: 1/7/2024US DUPLEX LOWER EXTREMITY VENOUS BILATERAL. HISTORY: Pain and swelling COMPARISON: None. TECHNIQUE: [place], color and pulsed Doppler imaging were performed.  FINDINGS: There is symmetric phasicity in the lower external iliac veins. RIGHT LOWER EXTREMITY: COMMON FEMORAL VEIN: No thrombus. GREAT SAPHENOUS ORIGIN: No thrombus. UPPER PROFUNDA FEMORAL VEIN: No thrombus. FEMORAL VEIN: No thrombus. POPLITEAL VEIN: No thrombus. Appropriate augmentation to flow is noted in the popliteal vein. TIBIOPERONEAL TRUNK: No thrombus. POSTERIOR TIBIAL VEINS: Not well visualized. PERONEAL VEINS: Not well visualized.  LEFT LOWER EXTREMITY: COMMON FEMORAL VEIN: No thrombus. GREAT SAPHENOUS ORIGIN: No thrombus. UPPER PROFUNDA FEMORAL VEIN: No thrombus. FEMORAL VEIN: No thrombus. POPLITEAL VEIN: No thrombus. Appropriate augmentation to flow is noted in the popliteal vein. TIBIOPERONEAL TRUNK: Not well visualized. POSTERIOR TIBIAL VEINS: Not well visualized.   PERONEAL VEINS: Not well visualized.     No evidence of deep venous thrombosis in bilateral femoral/popliteal veins, within above limitations. Please note that ultrasound is less sensitive for evaluation of thrombus in calf veins. If there is continued clinical concern, re-evaluation can be performed after 5-7 days to evaluate for propagation of an unseen thrombus from the calf.  Radiology Notify System Classification: Routine. Report initiated by:  Vincenza Hews, MD Reported and signed by: Alberteen Sam, MD  [place] Radiology and Biomedical Imaging [place] Head Cervical Spine wo IV ContrastResult Date: 1/7/2024CT HEAD CERVICAL SPINE WO IV CONTRAST ([facility]) INDICATION: Fall with head strike COMPARISON: None TECHNIQUE: [place] images were obtained from the vertex through T1 without intravenous contrast. Coronal and sagittal multiplanar reformatted images were provided. FINDINGS: Brain: There is no intracranial hemorrhage, edema, mass, mass effect or midline shift. There is no evidence of acute major vascular distribution infarct. Periventricular white matter hypodensity is nonspecific, though likely related to chronic small vessel ischemia. The ventricles and sulci are symmetrically enlarged, likely related to diffuse parenchymal volume loss.. The basal cisterns are patent. The paranasal sinuses and mastoid air cells are clear. The visualized orbits and osseous structures are unremarkable, noting left cataract surgery. Cervical Spine: There is no compression deformity or evidence for acute fracture or subluxation. The atlanto-occipital and atlanto-axial articulations are intact. Note is made of multilevel degenerative disc disease changes. 1. No evidence of acute intracranial abnormality.  2. No evidence for acute cervical spine fracture or traumatic subluxation. Please note that Noncontrast Head [place] is not sensitive for the detection of ischemic infarct. If ischemic infarct is of clinical concern, additional clinical or imaging evaluation is recommended. [place] Radiology Notify System Classification: Routine. Report initiated by:  Vincenza Hews, MD Reported and signed by: Lavera Guise, MD  [facility] Radiology and Biomedical Imaging CXRResult Date: 1/7/2024XR CHEST PA AND LATERAL INDICATION: fall, weakness COMPARISON: XR CHEST PA OR AP 2022-04-10 FINDINGS:   The cardiomediastinal silhouette is unchanged. Lungs are hypoinflated. No focal consolidation. Mild dependent atelectasis. The pleural spaces are clear. There is no acute osseous injury.  No acute cardiothoracic abnormality. [place] Radiology Notify System Classification: Routine. Reported and signed by: Alberteen Sam, MD  [facility] Radiology and Biomedical Imaging I have reviewed the patient's ECG as resulted in the EMR.Assessment and Plan: Reviewed care notes, discussed with nursing and care coordination team .Reviewed consult notes .>30 mins in review of chart and exam of pt and preparation of note, discussion w team members, > 50% direct care with patient50 year old male with a past medical histories of obesity hypertension presenting with hypertensive urgency and lower extremity edema and left lower extremity cellulitis as well as acute kidney injury.# left lower extremity cellulitisOn Unasyn vancomycin awaiting culture and sensitivityPain Management# acute kidney injury, hypokalemia, hyponatremia Ultrasound with no obstruction or hydronephrosisSupplement potassium chloride 20 milliequivalents  Continue isotonic fluidsPossible component dehydration urine sodium < 40  Usm mor ethan 400 mOsm  Very low albuminReceived the potassium chloride oral will recheck electrolytes in a.m.# hypertensive urgency resolvedContinue amlodipine hydralazine Toprol Crestor# obesity stage III with hypoalbuminemiaNutrition,Will add Nepro t.i.d.DVT prophylaxis in placeNo ACLS There is no immunization history on file for this patient.Signed:have reviewed the consultants recommendations and plan of care. Care was discussed with the nursing and case management teams. The patient was updated regarding the illness, plan of care and disposition plan. All orders were entered electronically. Dinah Beers MD.    [phone] No CodePortions of this document may have been transcribed using speech recognition software. Despite attempts at proofreading this document may contain transcription errors. Please do not hesitate to contact this writer to request clarification.

## 2023-01-02 NOTE — Progress Notes
[facility]	Nephrology Progress NoteAttending Provider: Loman Chroman * Assessment/Plan: Assessment  Marc Evans is a 72 year old male with a medical history of CKD 4, hypertension, hyperlipidemia, BPH with outflow obstruction, hx of bilateral nephrosis with bilateral nephrostomy tubes, s/p HOLEP surgery (05/18/21), history of UTIs with foley catheters, history of epididymoorchitis.  He came in for evaluation of weakness, not eating (but drinking) for three days.  Found to have AKI on CKD.  Left leg cellulitis.  Blood cultures 12/31/22 no growth to date.  ID consulting.?Echo 01/01/23 showed EF 55-60%; limited study; normal LV function and no valvular abnormalities.AKI on CKD, non oliguricBaseline serum creatinine last November thought to be 2.5-3.0.  Etiology of CKD was thought to be related to obstructive nephropathy in 2021, relieved by bilateraly nephrostomy tubes, followed by HOLEP surgery in 05/18/21.  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  UAs 2021-2022 had moderate proteinuria.?Etiology of AKI possibly ATN iso infection vs prerenal/CRS.   AP without iv contrast 12/31/22 shows no obstructive nephropathy.  UA 12/31/22 had 1+ protein, 1+ ketones, 2+ blood, few bacteria.  FeUrea 12/31/22 was 48.5% suggestive of intrinsic renal disease.  UPCR 11/27/22 had 0.940 g/mgCr.  UA 11/27/22 bland except for 1+ protein.?His sCr on 01/02/23 was 4.30 vs 4.15 on 01/01/23, somewhat stable.    Urine output 350 mL plus 2X unmeasured in last 24 hours.  He does not appear very fluid overloaded on exam except for LE which are slightly improved overnight.  He is on Unasyn IVP and vancomycin IVP for infection, and received a dose of torsemide on 12/31/22.  His vancomycin random level was 11.7 on 01/02/23.?Recommendations/Plan:-OK to continue IVF NS at 75 mL/hour for now-Continue to hold losartan-Would hold further diuretics with low threshold to resume of SOB or other evidence of worsening fluid overload-BMP daily-Strict I&O; measure urine output-Bladder scan q shift to rule out retention-Avoid nephrotoxins, including NSAIDS and iv contrast dye, if ableHypokalemiaLikely due to poor oral intake.  Serum potassium 3.0 on 01/02/23.  KCL 20 mEq given on 01/02/23 am.-Would give another 20 mEq this afternoon.-BMP daily  Hypocalcemia?Serum calcium 6.6 and albumin 1.6 on 01/02/23.  Corrected calcium 8.5 mg/dL.-Encourage po intake of food; would discontinue renal diet for now-Consider protein supplements-BMP dailyHyponatremiaImproved.  Serum sodium 131 on 01/02/23.  Albumin low at 1.6.  Urine sodium on low side at 31 and urine osm elevated 401.  Possibly inadequate intake of solutes (salt and protein) prior to admission plus or minus some SIADH from pain in infected leg.?-BMP daily     -Encourage intake of food with proteinAnemia of Chronic Renal DiseaseHgb 9.3 on 01/02/23.  Normocytic anemia.  No recent iron panel.-Iron panel (ordered)-CBC daily  Subjective: Subjective  Interim History:O/N no acute events noted.  Seen this afternoon, lying in bed.  No specific complaints.  Denies headache, dizziness, nausea, SOB.  SBP 120-160s.  O2 sat 93-97% on room air.Review of Allergies/Meds/Hx: I have reviewed the patient's: allergies and current scheduled medications Current Facility-Administered Medications Medication Dose Route Frequency Provider Last Rate Last Admin ? amLODIPine (NORVASC) tablet 10 mg  10 mg Oral Daily Sedaliu, Tomor, MD   10 mg at 01/02/23 0843 ? ampicillin-sulbactam (UNASYN) 1.5 g in sodium chloride 0.9% 50 mL (mini-bag plus)  1.5 g Intravenous Q8H Sedaliu, Tomor, MD 200 mL/hr at 01/02/23 1323 1.5 g at 01/02/23 1323 ? heparin (PORCINE) injection 5,000 Units  5,000 Units Subcutaneous Q8H Sedaliu, Tomor, MD  5,000 Units at 01/02/23 1323 ? hydrALAZINE (APRESOLINE) tablet 25 mg  25 mg Oral 3 times daily Sedaliu, Tomor, MD   25 mg at 01/02/23 1319 ? metoprolol succinate XL (TOPROL-XL) 24 hr tablet 25 mg  25 mg Oral Daily Sedaliu, Tomor, MD   25 mg at 01/02/23 0843 ? rosuvastatin (CRESTOR) tablet 5 mg  5 mg Oral Daily Sedaliu, Tomor, MD   5 mg at 01/02/23 0843 ? sodium chloride 0.9 % flush 3 mL  3 mL IV Push Q8H Sedaliu, Tomor, MD   3 mL at 01/02/23 1325 ? Vancomycin Therapy Placeholder   Intravenous placeholder Caryl Manas Form, MD     ? sodium chloride 75 mL/hr (01/01/23 1856) acetaminophen, morphine (ADULT), ondansetron **OR** ondansetron (ZOFRAN) IV Push, sodium chloride  Objective  Objective: Vitals:I have reviewed the current vital signs Patient Vitals for the past 24 hrs: BP Temp Temp src Pulse Resp SpO2 Height Weight 01/02/23 1318 (!) 154/71 97.9 ?F (36.6 ?C) Oral (!) 91 18 94 % -- -- 01/02/23 0754 (!) 154/70 98.1 ?F (36.7 ?C) Oral 84 18 (!) 93 % -- -- 01/02/23 0524 121/70 98.2 ?F (36.8 ?C) Oral 86 16 (!) 93 % -- -- 01/02/23 0215 136/75 97.9 ?F (36.6 ?C) Oral 81 16 97 % -- -- 01/01/23 2035 (!) 161/72 99.6 ?F (37.6 ?C) Oral (!) 91 18 (!) 93 % -- -- 01/01/23 1802 (!) 126/57 97.7 ?F (36.5 ?C) Oral 82 16 95 % 5' 11 (1.803 m) 112.6 kg 01/01/23 1725 (!) 155/62 98.8 ?F (37.1 ?C) Oral 87 18 95 % -- -- 01/01/23 1346 127/70 -- -- 88 20 94 % -- -- I/O's:Gross Totals (Last 24 hours) at 01/02/2023 1340Last data filed at 01/02/2023 0830Intake 170 ml Output 350 ml Net -180 ml Physical Exam Constitutional: Calm, NAD, AAOX3, speaking full sentences. Eyes  : Not pale, anicteric scleraeENT : moist oral mucosaNeck: No JVD notedCardiovascular: RRR S1 S2, no m/r/gChest: CTAB, diminished at bases.  No rales.Abdominal: Large, BS hypo, soft, nt GU : [place] catheter fell off.  No urine in foley bagExtremities:  Bilateral LE edema +3, L>R, left leg red with cellulitic changes (darker red than previous day)Skin : No rash on limited examNeurological:  Alert and oriented to person, place, and time. Psychiatric: Affect somewhat flatLabs:I have reviewed the current lab results Recent Labs Lab 01/07/241120 01/07/241550 01/07/242055 01/08/241232 01/09/240513 NA 129* 126* 129* 130* 131* K 3.9 3.6 3.5 3.2* 3.0* CL 93* 91* 95* 96* 98 CO2 19* 16* 17* 20* 20* BUN 48* 51* 54* 55* 52* CREATININE 4.25* 4.06* 4.05* 4.15* 4.30* GLU 99 96 96 110* 142* CALCIUM 8.1* 8.0* 7.4* 7.5* 6.6* MG 1.9  --   --  1.8 1.7* PHOS  --   --   --  3.6 3.8 Recent Labs Lab 01/07/241137 01/08/240904 01/08/241232 01/09/240512 WBC 11.8* 9.8 8.7 7.8 HGB 13.2 10.0* 10.8* 9.3* HCT 38.50 29.10* 30.70* 27.20* PLT 113* 97* 93* 88* Recent Labs Lab 01/07/241120 01/08/241232 01/09/240513 ALKPHOS 80 70 80 BILITOT 0.9 0.6 0.5 BILIDIR  --  0.3 0.3 PROT 7.4 6.4 5.7* ALT 33 33 48 AST 42 41 69*  Recent Labs Lab 01/07/241336 PROTEINUA 2+* UROBILINOGEN <2.0    Signed: Claude Manges, APRN [facility] CenterOffice: [phone] 9, 20249:07 AM

## 2023-01-02 NOTE — Plan of Care
Problem: Adult Inpatient Plan of CareGoal: Plan of Care ReviewOutcome: Interventions implemented as appropriate Problem: Skin Injury Risk IncreasedGoal: Skin Health and IntegrityOutcome: Interventions implemented as appropriate Problem: Fall Injury RiskGoal: Absence of Fall and Fall-Related InjuryOutcome: Interventions implemented as appropriate Plan of Care Overview/ Patient Status    Assumed care at 1900.A/O x 4.On room air not shortness of breath. O2 saturation 93%.C/O of LLE pain relieved with pain medication.Takes pills whole with water. IVF infusing NS at 75 cc /hr.PIV RAC and LW patent.Urinal within reach.LLE elevated with pillows.Safety maintained, call light at reach, frequent rounding. POC continued. 0600Condom cathter placed patent in low position. Temp:  [97.7 F (36.5 C)-99.6 F (37.6 C)] 98.2 F (36.8 C)Pulse:  [81-91] 86Resp:  [16-20] 16BP: (121-161)/(57-81) 121/70SpO2:  [93 %-97 %] 93 %Device (Oxygen Therapy): room air

## 2023-01-02 NOTE — Progress Notes
VANCOMYCIN LEVEL EVALUATIONCurrent Vancomycin Order: 1.5 g IV per laboratory/placeholder data. Day of Therapy: 3Vancomycin Indication: Suspected MRSA infectionEstimated Creatinine Clearance: 17 mL/min (A) (by C-G formula based on SCr of 4.3 mg/dL (H)).Creatinine (mg/dL) Date Value 16/09/9603 4.30 (H) 01/01/2023 4.15 (H) 12/31/2022 4.05 (H)  Renal Function: AKILevel: Lab Results Last 72 Hours Component Value Date/Time  Vancomycin Random 11.7 01/02/2023 05:13 AM  Vancomycin Random 17.4 01/01/2023 09:04 AM Type of Level: randomBased on vancomycin level obtained, recommend:   give one time dose vancomycin 1.5gRepeat Level:  01/11/2024ID/AST Consulted? Yes [facility] Vancomycin Dosing GuidelineBH Vancomycin Dosing GuidelineFor questions, please contact the pharmacist: Joni Reining, [facility]    Phone/Mobile Heartbeat: [phone]

## 2023-01-02 NOTE — Plan of Care
Plan of Care Overview/ Patient Status    Case Management Screening and Evaluation  Flowsheet Row Most Recent Value Case Management Screening: Chart review completed. If YES to any question below then proceed to CM Eval/Plan  Is there a change in their cognitive function No Do you anticipate a change in this patient's physicial function that will effect discharge needs? Yes Has there been a readmission within the last 30 days No Were there services prior to admission ( Examples: Assisted Living, HD, Homecare, Extended Care Facility, Methadone, SNF, Outpatient Infusion Center) No Negative/Positive Screen Positive Screening: Complete CM Evaluation and Plan Case Manager Attestation  I have reviewed the medical record and completed the above screen. CM staff will follow patient's progress and discuss the plan of care with the Treatment Team. Yes Case Management Evaluation and Plan  Arrived from prior to admission home/apartment/condo Do you have a caregiver, or do you anticipate the need for a caregiver given the change in your physicial function? No  Lives With Alone Services Prior to Admission none Patient Requires Care Coordination Intervention Due To discharge planning needs/concerns Prior to Hospitalization: Assistance Needed/DME being used None Documented Insurance Accurate Yes Any financial concerns related to anticipated discharge needs No Patient's home address verified Yes Patient's PCP of record verified Yes Last Date Seen by PCP 0-3 months Source of Clinical History  Patient's clinical history has been reviewed and source of Information is: Patient, Medical record Case Manager Attestation  I have reviewed the medical record and completed the above evaluation with the following recommendations. Yes Discharge Planning Coordination Recommendations  Discharge Planning Coordination Recommendations STR (short term rehab at a SNF rehab unit) Case Manager reviewed plan of care/ continuum of care need's with  Patient, Interdisciplinary Team, Transitional care rounds   Met with patient and explained my role and the pt verbalized understanding.  Pt lives alone in a 1st floor apt with 5 ste. Has no DME. Was independent. PT rec str, needs choices. Pasrr completed. 72 y.o. male hypertension hyperlipidemia and what appears to be CKD but he does not know this and does not have a nephrologist comes to the ED complaining of severe weakness for the past 3 days.Carolynne Edouard RN Case Manager

## 2023-01-02 NOTE — Plan of Care
Problem: Skin Injury Risk IncreasedGoal: Skin Health and Integrity1/08/2023 1513 by Wiley Flicker, RNOutcome: Interventions implemented as appropriate1/08/2023 1512 by Enora Trillo, RNOutcome: Interventions implemented as appropriateIntervention: Optimize Skin ProtectionFlowsheetsTaken 01/02/2023 1513Activity Management: activity minimizedHead of Bed (HOB) Positioning: HOB not elevated per patient requestTaken 01/02/2023 1100Pressure Reduction Devices: heel offloading device utilized positioning supports utilized Plan of Care Overview/ Patient Status    Assume care 0700-1900Patient alert and oriented x4. On RA denies SOB. Continue ABT treatment as ordered, external cath in place, voiding adequately,clear yellow output. Offer pain management to left leg, patient refuses at this time. Left leg elevated with pillows. Continue with POC, maintain safety, call light within reach

## 2023-01-03 ENCOUNTER — Inpatient Hospital Stay: Admit: 2023-01-03 | Payer: MEDICARE

## 2023-01-03 DIAGNOSIS — N179 Acute kidney failure, unspecified: Secondary | ICD-10-CM

## 2023-01-03 LAB — IRON AND TIBC
BKR IRON SATURATION: 17 % (ref 15–50)
BKR IRON: 18 ug/dL — ABNORMAL LOW (ref 59–158)
BKR TOTAL IRON BINDING CAPACITY: 105 ug/dL — ABNORMAL LOW (ref 250–450)

## 2023-01-03 LAB — HEPATIC FUNCTION PANEL
BKR A/G RATIO: 0.4 — ABNORMAL LOW (ref 1.0–2.2)
BKR ALANINE AMINOTRANSFERASE (ALT): 45 U/L (ref 14–63)
BKR ALBUMIN: 1.5 g/dL — ABNORMAL LOW (ref 3.4–5.0)
BKR ALKALINE PHOSPHATASE: 89 U/L — ABNORMAL LOW (ref 46–116)
BKR ASPARTATE AMINOTRANSFERASE (AST): 47 U/L — ABNORMAL HIGH (ref 10–42)
BKR AST/ALT RATIO: 1
BKR BILIRUBIN DIRECT: 0.2 mg/dL (ref 0.0–0.4)
BKR BILIRUBIN TOTAL: 0.4 mg/dL (ref 0.2–1.2)
BKR PROTEIN TOTAL: 5.7 g/dL — ABNORMAL LOW (ref 6.0–8.0)

## 2023-01-03 LAB — CBC WITH AUTO DIFFERENTIAL
BKR WAM ABSOLUTE IMMATURE GRANULOCYTES.: 0.11 x 1000/ÂµL (ref 0.00–0.30)
BKR WAM ABSOLUTE LYMPHOCYTE COUNT.: 0.78 x 1000/ÂµL (ref 0.60–3.70)
BKR WAM ABSOLUTE NRBC (2 DEC): 0 x 1000/ÂµL (ref 0.00–1.00)
BKR WAM ANALYZER ANC: 7.9 x 1000/ÂµL — ABNORMAL HIGH (ref 2.00–7.60)
BKR WAM BASOPHIL ABSOLUTE COUNT.: 0.04 x 1000/ÂµL (ref 0.00–1.00)
BKR WAM BASOPHILS: 0.4 % (ref 0.0–1.4)
BKR WAM EOSINOPHIL ABSOLUTE COUNT.: 0.22 x 1000/ÂµL (ref 0.00–1.00)
BKR WAM EOSINOPHILS: 2.3 % (ref 0.0–5.0)
BKR WAM HEMATOCRIT (2 DEC): 27.6 % — ABNORMAL LOW (ref 38.50–50.00)
BKR WAM HEMOGLOBIN: 9.5 g/dL — ABNORMAL LOW (ref 13.2–17.1)
BKR WAM IMMATURE GRANULOCYTES: 1.1 % — ABNORMAL HIGH (ref 0.0–1.0)
BKR WAM LYMPHOCYTES: 8.1 % — ABNORMAL LOW (ref 17.0–50.0)
BKR WAM MCH (PG): 31.3 pg (ref 27.0–33.0)
BKR WAM MCHC: 34.4 g/dL (ref 31.0–36.0)
BKR WAM MCV: 90.8 fL (ref 80.0–100.0)
BKR WAM MONOCYTE ABSOLUTE COUNT.: 0.59 x 1000/ÂµL (ref 0.00–1.00)
BKR WAM MONOCYTES: 6.1 % (ref 4.0–12.0)
BKR WAM MPV: 12.3 fL — ABNORMAL HIGH (ref 8.0–12.0)
BKR WAM NEUTROPHILS: 82 % — ABNORMAL HIGH (ref 39.0–72.0)
BKR WAM NUCLEATED RED BLOOD CELLS: 0 % (ref 0.0–1.0)
BKR WAM PLATELETS: 115 x1000/ÂµL — ABNORMAL LOW (ref 150–420)
BKR WAM RDW-CV: 12.5 % — ABNORMAL HIGH (ref 11.0–15.0)
BKR WAM RED BLOOD CELL COUNT.: 3.04 M/ÂµL — ABNORMAL LOW (ref 4.00–6.00)
BKR WAM WHITE BLOOD CELL COUNT: 9.6 x1000/ÂµL (ref 4.0–11.0)

## 2023-01-03 LAB — BASIC METABOLIC PANEL
BKR ANION GAP: 15 g/dL (ref 5–16)
BKR BLOOD UREA NITROGEN: 44 mg/dL — ABNORMAL HIGH (ref 7–18)
BKR BUN / CREAT RATIO: 10.1 (ref 8.0–23.0)
BKR CALCIUM: 7 mg/dL — ABNORMAL LOW (ref 8.5–10.5)
BKR CHLORIDE: 100 mmol/L (ref 98–107)
BKR CO2: 18 mmol/L — ABNORMAL LOW (ref 21–32)
BKR CREATININE: 4.35 mg/dL — ABNORMAL HIGH (ref 0.80–1.30)
BKR EGFR, CREATININE (CKD-EPI 2021): 14 mL/min/{1.73_m2} — ABNORMAL LOW (ref >=60–12.0)
BKR GLOBULIN: 133 mmol/L — ABNORMAL LOW (ref 136–145)
BKR GLUCOSE: 125 mg/dL — ABNORMAL HIGH (ref 70–100)
BKR POTASSIUM: 2.9 mmol/L — ABNORMAL LOW (ref 3.5–5.1)
BKR SODIUM: 133 mmol/L — ABNORMAL LOW (ref 136–145)

## 2023-01-03 LAB — FERRITIN: BKR FERRITIN: 800 ng/mL — ABNORMAL HIGH (ref 30–400)

## 2023-01-03 LAB — PHOSPHORUS     (BH GH L LMW YH): BKR PHOSPHORUS: 3.9 mg/dL (ref 2.5–4.9)

## 2023-01-03 LAB — MAGNESIUM: BKR MAGNESIUM: 1.8 mg/dL (ref 1.8–2.5)

## 2023-01-03 MED ORDER — POTASSIUM CHLORIDE ER 20 MEQ TABLET,EXTENDED RELEASE(PART/CRYST)
20 MEQ | Freq: Once | ORAL | Status: CP
Start: 2023-01-03 — End: ?
  Administered 2023-01-03: 23:00:00 20 MEQ via ORAL

## 2023-01-03 MED ORDER — POTASSIUM CHLORIDE ER 20 MEQ TABLET,EXTENDED RELEASE(PART/CRYST)
20 MEQ | Freq: Once | ORAL | Status: DC
Start: 2023-01-03 — End: 2023-01-03

## 2023-01-03 MED ORDER — FUROSEMIDE 10 MG/ML INJECTION SOLUTION
10 mg/mL | Freq: Once | INTRAVENOUS | Status: CP
Start: 2023-01-03 — End: ?
  Administered 2023-01-04: 03:00:00 10 mL via INTRAVENOUS

## 2023-01-03 MED ORDER — TIOTROPIUM BROMIDE 2.5 MCG/ACTUATION MIST FOR INHALATION
2.5 mcg/actuation | Freq: Every day | RESPIRATORY_TRACT | Status: DC
Start: 2023-01-03 — End: 2023-01-11
  Administered 2023-01-04 – 2023-01-10 (×7): 2.5 mcg/actuation via RESPIRATORY_TRACT

## 2023-01-03 MED ORDER — BISACODYL 5 MG TABLET,DELAYED RELEASE
5 mg | Freq: Two times a day (BID) | ORAL | Status: DC | PRN
Start: 2023-01-03 — End: 2023-01-06
  Administered 2023-01-05: 14:00:00 5 mg via ORAL

## 2023-01-03 MED ORDER — VANCOMYCIN MAR LEVEL
Freq: Once | INTRAVENOUS | Status: CP
Start: 2023-01-03 — End: ?

## 2023-01-03 MED ORDER — POTASSIUM CHLORIDE ER 20 MEQ TABLET,EXTENDED RELEASE(PART/CRYST)
20 MEQ | Freq: Once | ORAL | Status: CP
Start: 2023-01-03 — End: ?
  Administered 2023-01-03: 17:00:00 20 MEQ via ORAL

## 2023-01-03 MED ORDER — POLYETHYLENE GLYCOL 3350 17 GRAM ORAL POWDER PACKET
17 gram | Freq: Every day | ORAL | Status: DC
Start: 2023-01-03 — End: 2023-01-06
  Administered 2023-01-03 – 2023-01-05 (×3): 17 gram via ORAL

## 2023-01-03 MED ORDER — ALBUTEROL SULFATE 2.5 MG/3 ML (0.083 %) SOLUTION FOR NEBULIZATION
2.530.083 mg /3 mL (0.083 %) | Freq: Four times a day (QID) | RESPIRATORY_TRACT | Status: DC | PRN
Start: 2023-01-03 — End: 2023-01-11
  Administered 2023-01-04 (×2): 2.5 mL via RESPIRATORY_TRACT

## 2023-01-03 MED ORDER — FERROUS SULFATE 325 MG (65 MG IRON) TABLET
32565 mg (65 mg iron) | Freq: Two times a day (BID) | ORAL | Status: DC
Start: 2023-01-03 — End: 2023-01-11
  Administered 2023-01-03 – 2023-01-10 (×14): 325 mg (65 mg iron) via ORAL

## 2023-01-03 MED ORDER — TRAMADOL 50 MG TABLET
50 mg | Freq: Once | ORAL | Status: CP
Start: 2023-01-03 — End: ?
  Administered 2023-01-04: 03:00:00 50 mg via ORAL

## 2023-01-03 NOTE — Plan of Care
Plan of Care Overview/ Patient Status    SOCIAL WORK SCREENINGPatient Name: Marc Evans Record Number: [id number] Date of Birth: 1952/01/23Flowsheet Rows  Flowsheet Row Most Recent Value SW Admission Screening  Incomplete emergency contact or next of kin on record No Patient identity unknown No Uninsured/under-insured  No Group home or residential placement No Guardianship or conservatorship No Readmission within 30 days No International residency or residence outside of [place] No Currently on Dialysis No Active Interpersonal Violence/Sexual Assault No Concern for current abuse or neglect No Concern for current homelessness No Current behavioral health concerns No Concern for active substance use No Screening Result Negative: No current psychosocial barriers to discharge identified at this time. Admission Information  Document Type Direct Contact Social Work Screening - No intervention needed (For Inpatient/ED Only) Prior psychosocial assessment has been documented within this hospitalization No (For Inpatient/ED Only) Prior psychosocial assessment has been documented within 30 days of this hospitalization No Level of Care Inpatient  Darl Pikes, LCSWMilford Surgical Specialty Center Of Westchester [phone]

## 2023-01-03 NOTE — Progress Notes
[facility]	Nephrology Progress NoteAttending Provider: Loman Chroman * Assessment/Plan: Assessment  Marc Evans is a 72 year old male with a medical history of CKD 4, hypertension, hyperlipidemia, BPH with outflow obstruction, hx of bilateral nephrosis with bilateral nephrostomy tubes, s/p HOLEP surgery (05/18/21), history of UTIs with foley catheters, history of epididymoorchitis.  He came in for evaluation of weakness, not eating (but drinking) for three days.  Found to have AKI on CKD.  Left leg cellulitis.  Blood cultures 12/31/22 no growth to date.  ID consulting.?Echo 01/01/23 showed EF 55-60%; limited study; normal LV function and no valvular abnormalities.AKI on CKD, non oliguricBaseline serum creatinine last November thought to be 2.5-3.0.  Etiology of CKD was thought to be related to obstructive nephropathy in 2021, relieved by bilateraly nephrostomy tubes, followed by HOLEP surgery in 05/18/21.  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  UAs 2021-2022 had moderate proteinuria.?Etiology of AKI possibly ATN iso infection vs prerenal/CRS.  [place] AP without iv contrast 12/31/22 shows no obstructive nephropathy.  UA 12/31/22 had 1+ protein, 1+ ketones, 2+ blood, few bacteria.  FeUrea 12/31/22 was 48.5% suggestive of intrinsic renal disease.  UPCR 11/27/22 had 0.940 g/mgCr.  UA 11/27/22 bland except for 1+ protein.?His sCr on 01/03/23 was 4.35 vs 4.30 on 01/02/23, stable.  BUN stable to improving.    Urine output 2.0 L in last 24 hours.  He is on Unasyn IVP.  He received vancomycin IVP.  His vancomycin random level was 11.7 on 01/02/23.?Recommendations/Plan:-Agree with discontinuing IVF for now-Agree with repeat CXR to evaluate fluid status (new wheeze right lung)-Continue to hold losartan-Would hold further diuretics with low threshold to resume of SOB or other evidence of worsening fluid overload-BMP daily-Strict I&O; measure urine output-Bladder scan q shift to rule out retention-Avoid nephrotoxins, including NSAIDS and iv contrast dye, if ableHypokalemiaLikely due to poor oral intake.  Serum potassium 2.9 on 01/03/23.  KCL 40 mEq given on 01/03/23 am.-Would give another KCL 20 mEq this afternoon.-BMP daily  Hypocalcemia?Serum calcium 7.0 and albumin 1.5 on 01/03/23.  Corrected calcium 9.0 mg/dL.-Encourage po intake of food; would discontinue renal diet for now-Consider protein supplements-BMP dailyHyponatremiaImproved.  Serum sodium 133 on 01/03/23.  Albumin low at 1.5.  Urine sodium was relatively low at 31 and urine osm elevated 401.  Inadequate intake of solutes (salt and protein) prior to admission plus or minus some SIADH from pain in infected leg.?-BMP daily     -Encourage intake of food with proteinAnemia of Chronic Renal DiseaseHgb 9.5 on 01/03/23.  Normocytic anemia.  Iron sat 17 onn 01/03/23.-Would hold off IV iron due to infection-Would start po iron daily-CBC daily  Subjective: Subjective  Interim History:O/N no acute events noted.  ID notes improvement in cellulitis.  Seen this afternoon, lying in bed.  AAOX3.  C/o leg pain (receiving pain med) and wheezing.  Denies current nausea.SBP 120-160s.  O2 sat 93-97% on room air.Review of Allergies/Meds/Hx: I have reviewed the patient's: allergies and current scheduled medications Current Facility-Administered Medications Medication Dose Route Frequency Provider Last Rate Last Admin ? amLODIPine (NORVASC) tablet 10 mg  10 mg Oral Daily Sedaliu, Tomor, MD   10 mg at 01/03/23 1610 ? ampicillin-sulbactam (UNASYN) 1.5 g in sodium chloride 0.9% 50 mL (mini-bag plus)  1.5 g Intravenous Q8H Sedaliu, Tomor, MD 200 mL/hr at 01/03/23 0558 1.5 g at 01/03/23 0558 ? heparin (PORCINE) injection 5,000 Units  5,000 Units Subcutaneous Q8H Sedaliu, Tomor,  MD   5,000 Units at 01/03/23 0558 ? hydrALAZINE (APRESOLINE) tablet 25 mg  25 mg Oral 3 times daily Sedaliu, Tomor, MD   25 mg at 01/03/23 0820 ? metoprolol succinate XL (TOPROL-XL) 24 hr tablet 25 mg  25 mg Oral Daily Sedaliu, Tomor, MD   25 mg at 01/03/23 0820 ? potassium chloride SA (K-DUR,KLOR-CON M) 24 hr tablet 40 mEq  40 mEq Oral Once Giuran-Benetato, Nena Polio, MD     ? rosuvastatin (CRESTOR) tablet 5 mg  5 mg Oral Daily Sedaliu, Tomor, MD   5 mg at 01/03/23 0820 ? sodium chloride 0.9 % flush 3 mL  3 mL IV Push Q8H Sedaliu, Tomor, MD   3 mL at 01/02/23 2142 ? [START ON 01/04/2023] Vancomycin MAR Level   Intravenous Once Giuran-Benetato, Nena Polio, MD     ? Vancomycin Therapy Placeholder   Intravenous placeholder Aldena Worm Form, MD     ? sodium chloride 75 mL/hr (01/02/23 2205) acetaminophen, ondansetron **OR** ondansetron (ZOFRAN) IV Push, oxyCODONE, sodium chloride  Objective  Objective: Vitals:I have reviewed the current vital signs Patient Vitals for the past 24 hrs: BP Temp Temp src Pulse Resp SpO2 01/03/23 0757 (!) 167/72 -- -- 74 18 95 % 01/03/23 0604 130/71 98.4 ?F (36.9 ?C) Oral 72 18 96 % 01/03/23 0603 130/71 98.4 ?F (36.9 ?C) Oral 72 18 97 % 01/02/23 2108 (!) 169/70 99.4 ?F (37.4 ?C) Oral 85 16 94 % 01/02/23 1318 (!) 154/71 97.9 ?F (36.6 ?C) Oral (!) 91 18 94 % I/O's:Gross Totals (Last 24 hours) at 01/03/2023 1102Last data filed at 01/03/2023 0604Intake 720 ml Output 2000 ml Net -1280 ml Physical Exam Constitutional: Calm, NAD, AAOX3, speaking full sentences. Eyes  : Not pale, anicteric scleraeENT : moist oral mucosaNeck: No JVD notedCardiovascular: RRR S1 S2, no m/r/gChest: CTAB, diminished at bases.  No rales.  Expiratory wheeze on right.Abdominal: Large, BS hypo, soft, nt GU : [place] catheter fell off.  No urine in foley bagExtremities:  Bilateral LE edema +3, L>R, left leg red with cellulitic changesSkin : No rash on limited examNeurological:  Alert and oriented to person, place, and time. Psychiatric: Affect somewhat flatLabs:I have reviewed the current lab results Recent Labs Lab 01/07/241120 01/07/241550 01/07/242055 01/08/241232 01/09/240513 01/10/240614 NA 129*   < > 129* 130* 131* 133* K 3.9   < > 3.5 3.2* 3.0* 2.9* CL 93*   < > 95* 96* 98 100 CO2 19*   < > 17* 20* 20* 18* BUN 48*   < > 54* 55* 52* 44* CREATININE 4.25*   < > 4.05* 4.15* 4.30* 4.35* GLU 99   < > 96 110* 142* 125* CALCIUM 8.1*   < > 7.4* 7.5* 6.6* 7.0* MG 1.9  --   --  1.8 1.7* 1.8 PHOS  --   --   --  3.6 3.8 3.9  < > = values in this interval not displayed. Recent Labs Lab 01/08/240904 01/08/241232 01/09/240512 01/10/240614 WBC 9.8 8.7 7.8 9.6 HGB 10.0* 10.8* 9.3* 9.5* HCT 29.10* 30.70* 27.20* 27.60* PLT 97* 93* 88* 115* Recent Labs Lab 01/07/241120 01/08/241232 01/09/240513 01/10/240614 ALKPHOS 80 70 80 89 BILITOT 0.9 0.6 0.5 0.4 BILIDIR  --  0.3 0.3 0.2 PROT 7.4 6.4 5.7* 5.7* ALT 33 33 48 45 AST 42 41 69* 47*  Recent Labs Lab 01/07/241336 PROTEINUA 2+* UROBILINOGEN <2.0    Signed: Claude Manges, APRN [facility] CenterOffice: [phone] 10, 20249:07 AM

## 2023-01-03 NOTE — Plan of Care
Problem: Skin Injury Risk IncreasedGoal: Skin Health and IntegrityIntervention: Optimize Skin ProtectionFlowsheetsTaken 01/03/2023 1548Pressure Reduction Techniques: frequent weight shift encouragedTaken 01/02/2023 1513Activity Management: activity minimized Problem: Fall Injury RiskGoal: Absence of Fall and Fall-Related InjuryIntervention: Promote Injury-Free EnvironmentFlowsheets (Taken 01/03/2023 1000)Universal Fall Prevention: activity supervised assistive device/personal items within reach keep room clutter free with open pathway to the bathroom fall prevention program maintained toileting scheduled safety round/check completed room organization consistent nonskid shoes/slippers when out of bed Plan of Care Overview/ Patient Status    Assume care 0700-1900Patient alert and oriented x3, forgetful at times. On RA denies SOB. Infrequent nonproductive cough with audible wheezing noted. MD aware. Left leg elevated with pillows. Continue ABT as ordered. External catheter in place, draining yellow output. Continue with POC, maintain safety, call light within reach

## 2023-01-03 NOTE — Plan of Care
Inpatient Physical Therapy Progress Note IP Adult PT Eval/Treat - 01/03/23 1409    Date of Visit / Treatment  Date of Visit / Treatment 01/03/23   2/4  Note Type Progress Note   Start Time 1345   End Time 1409   Total Treatment Time 24    General Information  Subjective I can try   Other Providers DPT   General Observations Chart reviewed. Discussed with RN pre and post session. Pt presented supine in bed with +PIV, +catheter. Pt agreeable to work with PT   Precautions/Limitations Fall Precautions;Bed alarm    Vital Signs and Orthostatic Vital Signs  Vital Signs: Pre, During, & Post treatment During Vitals   During Treatment BP 111/74   During Treatment SpO2 (%) 95    Pain/Comfort  Location #1 - PreTreatment Rating (Numbers Scale) 0/10 - no pain   Posttreatment Rating (Numbers Scale) 6/10   Pain Location - Side Left   Pain Location leg   Pain Comment (Pre/Post Treatment Pain) RN notified about pain    Patient Coping  Observed Emotional State cooperative   Verbalized Emotional State acceptance    Cognition  Overall Cognitive Status Impaired   Orientation Level Oriented to person;Oriented to time;Disoriented to place;Disoriented to situation   Level of Consciousness confused   Following Commands Follows one step commands with repetition   Administrator, arts / Judgment Requires supervision for safety cognitively    Sensory Assessment  Sensory Tests Results No sensory impairment noted    Skin Assessment  Skin Assessment See Nursing Documentation    Balance  Sitting Balance: Static  POOR-     Maximal assist to maintain static position with no Assistive Device   Sitting Balance: Dynamic  POOR-    Unable to move from midline voluntarily, maximal assist to right self    Bed Mobility  Symptoms Noted During/After Treatment increased pain   Supine-to-Sit Independence/Assistance Level Maximum assist;Assist of 2 Supine-to-Sit Assist Device Bed rails;Hand held assist;Head of bed elevated   Sit-to-Supine Independence/Assistance Level Maximum assist;Assist of 2   Sit-to-Supine Assist Device Bed rails;Hand held assist;Head of bed elevated   Limitations decreased ability to use legs for bridging/pushing;impaired ability to control trunk for mobility   Bed Mobility, Impairments ROM decreased;strength decreased;impaired balance;pain;motor control impaired;postural control impaired   Bed mobility was limited by: pain;cognition   Bed Mobility Comments Pt was able to inititate bed mobility, however required max A to complete task to sit at EOB. At was limited by pain, mobility deficits, and required multiple VC to complete task    Handoff Documentation  Handoff Patient in bed;Bed alarm;Patient instructed to call nursing for mobility;Discussed with nursing   Handoff Comments Updated RN about pt status. Callbell provided and notified to contact RN if mobility needed    PT- AM-PAC - Basic Mobility Screen- How much help from another person do you currently need.....  Turning from your back to your side while in a a flat bed without using rails? 2 - A Lot - Requires a lot of help (maximum to moderate assistance). Can use assistive devices.   Moving from lying on your back to sitting on the side of a flat bed without using bed rails? 1 - Total - Requires total assistance or cannot do it at all.   Moving to and from a bed to a chair (including a wheelchair)? 1 - Total - Requires total assistance or cannot do it at all.   Standing up from a chair using your arms(e.g.,  wheelchair or bedside chair)? 1 - Total - Requires total assistance or cannot do it at all.   To walk in a hospital room? 1 - Total - Requires total assistance or cannot do it at all.   Climbing 3-5 steps with a railing? 1 - Total - Requires total assistance or cannot do it at all.   AMPAC Mobility Score 7   TARGET Highest Level of Mobility Mobility Level 2, Turn self in bed/bed activity/dependent transfer    ACTUAL Highest Level of Mobility Mobility Level 3, Sit on edge of bed    Clinical Impression  Follow up Assessment Pt tolerated session fair at best based on their performance. Pt was able to display ankle dorsiflexion and plantarflexion and SLR. Although during bed mobility pt required max A assist of 2 with hand held assistance in order to complete task of supine to sit. Multiple VC were required for pt to initate any type of movement for bed mobility. Once the pt seated at EOB pt reported increase levels of dizziness that did not subside with rest, as well as increase in pain levels to 6/10. BP was taken which recorded as 111/74. When placed from sit to supine pt required max A assist of 2 to return to bed appropriately. Pt would continue to benefit from skilled PT to address deficits with mobility, strength, and endurance.   Criteria for Skilled Therapeutic Interventions Met yes;treatment indicated   Impairments Found (describe specific impairments) gait;mobility;pain   Functional Limitations in Following Categories (Describe Specific Limitations) mobility   Rehab Potential good, to achieve stated therapy goals    Patient/Family Stated Goals  Patient/Family Stated Goal(s) return home;return to prior level of functioning;decrease pain    Frequency/Equipment Recommendations  PT Frequency 4x per week   Mon  What day of week is next treatment expected? Thursday    Planned Treatment / Interventions  Plan for Next Visit Progress mobility   General Treatment / Interventions Aerobic Capacity / Endurance Conditioning   Training Treatment / Interventions Balance / Gait Training   Education Treatment / Interventions Patient Education / Training    PT Discharge Summary  Physical Therapy Disposition Recommendation Inpatient Rehab   Equipment Recommendations for Discharge Patient has all necessary durable medical equipment     Problem: Physical Therapy GoalsGoal: Physical Therapy GoalsDescription: Pt will complete bed mobility with modified (I) Pt will complete sit to stand transfers with RW and supervision Pt will ambulate 50 ft with RW and supervision Outcome: Interventions implemented as appropriate .ton

## 2023-01-03 NOTE — Plan of Care
Problem: Adult Inpatient Plan of CareGoal: Plan of Care ReviewFlowsheets (Taken 01/02/2023 2013)Progress: improvingPlan of Care Reviewed With: patient Plan of Care Overview/ Patient Status    Assumed care of patient around 1900. Patient is alert and oriented times 4. No report of pain. VSS. RA. Continues on IV ABT therapy and fluids. Left lower extremity with redness, swelling and drainage. Safety measures in place and call bell in reach.

## 2023-01-03 NOTE — Progress Notes
[facility]	Infectious Disease Progress NoteAttending Provider: Loman Chroman * PCP: Chales Abrahams, PiyushHospital Day: 3Subjective: Interim History:  No acute events overnight.  Reports feeling somewhat better, no fevers, no shortness of breath or cough, no chest pain, no abdominal pain, no diarrheaReview of Allergies/Meds/Hx: Allergies have been reviewed.Current Medications:amLODIPine, 10 mg, Dailyampicillin-sulbactam, 1.5 g, Q8Hheparin (PORCINE), 5,000 Units, Q8HhydrALAZINE, 25 mg, 3 times dailymetoprolol succinate XL, 25 mg, Dailyrosuvastatin, 5 mg, Dailysodium chloride, 3 mL, Q8H[START ON 01/04/2023] Vancomycin MAR level, , OnceVancomycin Therapy Placeholder, , placeholderCurrent Infusions:? sodium chloride 75 mL/hr (01/02/23 2205) PRN Medications:acetaminophen, ondansetron **OR** ondansetron (ZOFRAN) IV Push, oxyCODONE, sodium chlorideObjective: Vitals:Temp:  [98.4 ?F (36.9 ?C)-99.4 ?F (37.4 ?C)] 99.3 ?F (37.4 ?C)Pulse:  [72-87] 87Resp:  [16-18] 18BP: (130-169)/(68-72) 165/68SpO2:  [94 %-97 %] 97 %SpO2:  [94 %-97 %] 97 % (01/10 1332) Gross Totals (Last 24 hours) at 01/03/2023 1416Last data filed at 01/03/2023 0604Intake 480 ml Output 1600 ml Net -1120 ml Lungs clear, heart S1-S2 regular, abdomen soft no tenderness, extremities left lower extremity decreased edema and decreased erythemaLabs:Recent Labs Lab 01/08/241232 01/09/240513 01/10/240614 NA 130* 131* 133* K 3.2* 3.0* 2.9* CL 96* 98 100 CO2 20* 20* 18* BUN 55* 52* 44* CREATININE 4.15* 4.30* 4.35* GLU 110* 142* 125* CALCIUM 7.5* 6.6* 7.0* MG 1.8 1.7* 1.8 Recent Labs Lab 01/08/241232 01/09/240512 01/10/240614 WBC 8.7 7.8 9.6 HGB 10.8* 9.3* 9.5* HCT 30.70* 27.20* 27.60* PLT 93* 88* 115* No results for input(s): APTT, INR, PTT in the last 168 hours.No results found for: TROPONINIDiagnostics and microbiology reviewedAssessment & Plan: 1. Left lower extremity cellulitis gradually improvingPlan:Strict leg elevation.  Monitor white blood cell count and temperatures.  Continue vancomycin and Unasyn.Signed:Gracelin Weisberg, MD Please page using smart web (Hours 8 AM to 4 PM)1/10/20242:16 PM

## 2023-01-04 LAB — CBC WITH AUTO DIFFERENTIAL
BKR WAM ABSOLUTE IMMATURE GRANULOCYTES.: 0.21 x 1000/??L (ref 0.00–0.30)
BKR WAM ABSOLUTE LYMPHOCYTE COUNT.: 0.75 x 1000/??L (ref 0.60–3.70)
BKR WAM ABSOLUTE NRBC (2 DEC): 0 x 1000/??L (ref 0.00–1.00)
BKR WAM ANALYZER ANC: 11.11 x 1000/??L — ABNORMAL HIGH (ref 2.00–7.60)
BKR WAM BASOPHIL ABSOLUTE COUNT.: 0.03 x 1000/??L (ref 0.00–1.00)
BKR WAM BASOPHILS: 0.2 % (ref 0.0–1.4)
BKR WAM EOSINOPHIL ABSOLUTE COUNT.: 0.2 x 1000/??L (ref 0.00–1.00)
BKR WAM EOSINOPHILS: 1.5 % (ref 0.0–5.0)
BKR WAM HEMATOCRIT (2 DEC): 29.6 % — ABNORMAL LOW (ref 38.50–50.00)
BKR WAM HEMOGLOBIN: 10.1 g/dL — ABNORMAL LOW (ref 13.2–17.1)
BKR WAM IMMATURE GRANULOCYTES: 1.6 % — ABNORMAL HIGH (ref 0.0–1.0)
BKR WAM LYMPHOCYTES: 5.7 % — ABNORMAL LOW (ref 17.0–50.0)
BKR WAM MCH (PG): 30.4 pg (ref 27.0–33.0)
BKR WAM MCHC: 34.1 g/dL (ref 31.0–36.0)
BKR WAM MCV: 89.2 fL (ref 80.0–100.0)
BKR WAM MONOCYTE ABSOLUTE COUNT.: 0.92 x 1000/??L (ref 0.00–1.00)
BKR WAM MONOCYTES: 7 % (ref 4.0–12.0)
BKR WAM MPV: 11.6 fL (ref 8.0–12.0)
BKR WAM NEUTROPHILS: 84 % — ABNORMAL HIGH (ref 39.0–72.0)
BKR WAM NUCLEATED RED BLOOD CELLS: 0 % (ref 0.0–1.0)
BKR WAM PLATELETS: 182 x1000/??L (ref 150–420)
BKR WAM RDW-CV: 12.5 % (ref 11.0–15.0)
BKR WAM RED BLOOD CELL COUNT.: 3.32 M/??L — ABNORMAL LOW (ref 4.00–6.00)
BKR WAM WHITE BLOOD CELL COUNT: 13.2 x1000/??L — ABNORMAL HIGH (ref 4.0–11.0)

## 2023-01-04 LAB — CBC WITHOUT DIFFERENTIAL
BKR WAM ANALYZER ANC: 11.21 x 1000/ÂµL — ABNORMAL HIGH (ref 2.00–7.60)
BKR WAM HEMATOCRIT (2 DEC): 29.6 % — ABNORMAL LOW (ref 38.50–50.00)
BKR WAM HEMOGLOBIN: 10.2 g/dL — ABNORMAL LOW (ref 13.2–17.1)
BKR WAM MCH (PG): 30.8 pg — ABNORMAL LOW (ref 27.0–33.0)
BKR WAM MCHC: 34.5 g/dL (ref 31.0–36.0)
BKR WAM MCV: 89.4 fL (ref 80.0–100.0)
BKR WAM MPV: 11.8 fL — ABNORMAL HIGH (ref 8.0–12.0)
BKR WAM PLATELETS: 183 x1000/ÂµL (ref 150–420)
BKR WAM RED BLOOD CELL COUNT.: 3.31 M/ÂµL — ABNORMAL LOW (ref 4.00–6.00)
BKR WAM WHITE BLOOD CELL COUNT: 13.2 x1000/ÂµL — ABNORMAL HIGH (ref 4.0–11.0)

## 2023-01-04 LAB — NT-PROBNPE: BKR B-TYPE NATRIURETIC PEPTIDE, PRO (PROBNP): 9512 pg/mL — ABNORMAL HIGH (ref <125.0)

## 2023-01-04 LAB — D-DIMER, QUANTITATIVE: BKR D-DIMER: 7.31 mg/L FEU — ABNORMAL HIGH (ref 98–<=0.69)

## 2023-01-04 LAB — TROPONIN T HIGH SENSITIVITY, 1 HOUR WITH REFLEX (BH GH LMW YH)
BKR TROPONIN T HS 1 HOUR DELTA FROM 0 HOUR: 1 ng/L (ref 0.0–5.0)
BKR TROPONIN T HS 1 HOUR: 37 ng/L — ABNORMAL HIGH

## 2023-01-04 LAB — COMPREHENSIVE METABOLIC PANEL
BKR A/G RATIO: 0.4 — ABNORMAL LOW (ref 1.0–2.2)
BKR ALANINE AMINOTRANSFERASE (ALT): 50 U/L (ref 14–63)
BKR ALBUMIN: 1.7 g/dL — ABNORMAL LOW (ref 3.4–5.0)
BKR ALKALINE PHOSPHATASE: 112 U/L (ref 46–116)
BKR ANION GAP: 13 g/dL (ref 5–16)
BKR ASPARTATE AMINOTRANSFERASE (AST): 58 U/L — ABNORMAL HIGH (ref 10–42)
BKR AST/ALT RATIO: 1.2
BKR BILIRUBIN TOTAL: 0.3 mg/dL (ref 0.2–1.2)
BKR BLOOD UREA NITROGEN: 40 mg/dL — ABNORMAL HIGH (ref 7–18)
BKR BUN / CREAT RATIO: 9.7 (ref 8.0–23.0)
BKR CALCIUM: 7.1 mg/dL — ABNORMAL LOW (ref 8.5–10.5)
BKR CHLORIDE: 101 mmol/L (ref 98–107)
BKR CO2: 19 mmol/L — ABNORMAL LOW (ref 21–32)
BKR CREATININE: 4.14 mg/dL — ABNORMAL HIGH (ref 0.80–1.30)
BKR EGFR, CREATININE (CKD-EPI 2021): 15 mL/min/{1.73_m2} — ABNORMAL LOW (ref >=60)
BKR GLOBULIN: 4.8 g/dL — ABNORMAL HIGH (ref 2.3–3.5)
BKR GLUCOSE: 163 mg/dL — ABNORMAL HIGH (ref 70–100)
BKR POTASSIUM: 3.2 mmol/L — ABNORMAL LOW (ref 3.5–5.1)
BKR PROTEIN TOTAL: 6.5 g/dL (ref 6.0–8.0)
BKR SODIUM: 133 mmol/L — ABNORMAL LOW (ref 136–145)
BKR WAM RDW-CV: 163 mg/dL — ABNORMAL HIGH (ref 70–100)

## 2023-01-04 LAB — PHOSPHORUS     (BH GH L LMW YH): BKR PHOSPHORUS: 3.2 mg/dL (ref 2.5–4.9)

## 2023-01-04 LAB — BASIC METABOLIC PANEL
BKR ANION GAP: 14 (ref 5–16)
BKR BLOOD UREA NITROGEN: 39 mg/dL — ABNORMAL HIGH (ref 7–18)
BKR BUN / CREAT RATIO: 9.5 (ref 8.0–23.0)
BKR CALCIUM: 6.9 mg/dL — CL (ref 8.5–10.5)
BKR CHLORIDE: 99 mmol/L (ref 98–107)
BKR CO2: 18 mmol/L — ABNORMAL LOW (ref 21–32)
BKR CREATININE: 4.09 mg/dL — ABNORMAL HIGH (ref 0.80–1.30)
BKR EGFR, CREATININE (CKD-EPI 2021): 15 mL/min/{1.73_m2} — ABNORMAL LOW (ref >=60)
BKR GLUCOSE: 175 mg/dL — ABNORMAL HIGH (ref 70–100)
BKR POTASSIUM: 3.2 mmol/L — ABNORMAL LOW (ref 3.5–5.1)
BKR SODIUM: 131 mmol/L — ABNORMAL LOW (ref 136–145)

## 2023-01-04 LAB — TROPONIN T HIGH SENSITIVITY, 3 HOUR (BH GH LMW YH)
BKR TROPONIN T HS 1 HOUR DELTA FROM 0 HOUR ON 3HR: 1 ng/L
BKR TROPONIN T HS 3 HOUR DELTA FROM 0 HOUR: 0 ng/L — ABNORMAL HIGH (ref 0.0–1.0)
BKR TROPONIN T HS 3 HOUR: 36 ng/L — ABNORMAL HIGH

## 2023-01-04 LAB — VANCOMYCIN, RANDOM     (BH GH LMW YH): BKR VANCOMYCIN RANDOM: 16.5 ug/mL

## 2023-01-04 LAB — MAGNESIUM: BKR MAGNESIUM: 1.5 mg/dL — ABNORMAL LOW (ref 1.8–2.5)

## 2023-01-04 LAB — TROPONIN T HIGH SENSITIVITY, 0 HOUR BASELINE WITH REFLEX (BH GH LMW YH): BKR TROPONIN T HS 0 HOUR BASELINE: 36 ng/L — ABNORMAL HIGH

## 2023-01-04 MED ORDER — CALCIUM CARBONATE 200 MG CALCIUM (500 MG) CHEWABLE TABLET
500200 mg (200 mg calcium) | Freq: Once | ORAL | Status: CP
Start: 2023-01-04 — End: ?
  Administered 2023-01-04: 11:00:00 500 mg (200 mg calcium) via ORAL

## 2023-01-04 MED ORDER — VANCOMYCIN MAR LEVEL
Freq: Once | INTRAVENOUS | Status: CP
Start: 2023-01-04 — End: ?

## 2023-01-04 MED ORDER — FUROSEMIDE 10 MG/ML INJECTION SOLUTION
10 mg/mL | Freq: Once | INTRAVENOUS | Status: CP
Start: 2023-01-04 — End: ?
  Administered 2023-01-04: 23:00:00 10 mL via INTRAVENOUS

## 2023-01-04 MED ORDER — POTASSIUM CHLORIDE ER 20 MEQ TABLET,EXTENDED RELEASE(PART/CRYST)
20 MEQ | Freq: Once | ORAL | Status: CP
Start: 2023-01-04 — End: ?
  Administered 2023-01-04: 11:00:00 20 MEQ via ORAL

## 2023-01-04 MED ORDER — MAGNESIUM OXIDE 400 MG (241.3 MG MAGNESIUM) TABLET
400241.3 mg (241.3 mg magnesium) | Freq: Once | ORAL | Status: CP
Start: 2023-01-04 — End: ?
  Administered 2023-01-04: 11:00:00 400 mg (241.3 mg magnesium) via ORAL

## 2023-01-04 MED ORDER — POTASSIUM CHLORIDE ER 20 MEQ TABLET,EXTENDED RELEASE(PART/CRYST)
20 MEQ | Freq: Once | ORAL | Status: CP
Start: 2023-01-04 — End: ?
  Administered 2023-01-04: 20:00:00 20 MEQ via ORAL

## 2023-01-04 NOTE — Progress Notes (Signed)
EPIC Encounter for ICM Monitoring  Patient Name: Russell [name]. is a 72 y.o. male Date: 01/04/2023 Primary Care Physican: Olin Hauser, DO Primary Cardiologist: Rockey Situ Electrophysiologist: Quentin Ore 08/10/2022 Weight: 164 lbs 09/15/2022 Weight: 164-165 lbs 11/24/2022 Weight: 165 lbs                                                          Spoke with patient and heart failure questions reviewed.  Transmission results reviewed.  Pt asymptomatic for fluid accumulation.  Reports feeling well at this time and voices no complaints.     Optivol thoracic impedance suggesting normal fluid levels.   Prescribed: Spironolactone 25 mg take 1 tablet daily   Labs: 10/11/2022 Creatinine 1.11, BUN 14, Potassium 4.0, Sodium 142, GFR 72 10/04/2022 Creatinine 1.15, BUN 15, Potassium 4.3, Sodium 141  04/05/2022 Creatinine 1.23, BUN 13, Potassium 4.6, Sodium 139, GFR 64 A complete set of results can be found in Results Review.   Recommendations:  No changes and encouraged to call if experiencing any fluid symptoms.   Follow-up plan: ICM clinic phone appointment on 12/07/2023.   91 day device clinic remote transmission 12/06/2023.    EP/Cardiology Next Office Visit:  04/16/2023 with Dr Rockey Situ.  Recall 04/02/2023 with Dr Quentin Ore.     Copy of ICM check sent to Dr. Quentin Ore.  3 month ICM trend: 01/01/2023.    12-14 Month ICM trend:     Rosalene Billings, RN 01/04/2023 4:03 PM

## 2023-01-04 NOTE — Plan of Care
Plan of Care Overview/ Patient Status    RN called for PRN nebs. Nebs given x 2 for upper airway wheeze. VS RR 24, Spo2 98, HR 92. Hospital provider and RN aware.

## 2023-01-04 NOTE — Progress Notes
VANCOMYCIN LEVEL EVALUATIONCurrent Vancomycin Order: 1.5 g IV per laboratory/placeholder data. Day of Therapy: 5Vanco trough Goal: 10-15Vancomycin Indication: Cellulitis LLE. On Vanco/UnasynEstimated Creatinine Clearance: 18 mL/min (A) (by C-G formula based on SCr of 4.09 mg/dL (H)).Creatinine (mg/dL) Date Value 16/09/9603 4.09 (H) 01/04/2023 4.14 (H) 01/03/2023 4.35 (H)  Renal Function: AKI on CKD. Level: Lab Results Last 72 Hours Component Value Date/Time  Vancomycin Random 16.5 01/04/2023 04:36 AM  Vancomycin Random 11.7 01/02/2023 05:13 AM  Vancomycin Random 17.4 01/01/2023 09:04 AM Type of Level: Random (42 hours post-dose)Based on vancomycin level obtained, recommend:  Hold off on dosing. Level still, 42 hours after dose. Repeat Level:  1/12@0600ID /AST Consulted? Yes [facility] Vancomycin Dosing GuidelineFor questions, please contact the pharmacist: Windy Fast, PharmD via [facility].

## 2023-01-04 NOTE — Significant Event
RAPID EVALUATION NOTEREV TRIGGER: shortness of breath.HPI: Marc Evans is a 72 y.o. male  has a past medical history of High blood pressure and Hypertension.. Patient admitted with cellulitis, patient c/o shortness of breath this evening, worse with speaking and exertion, this afternoon noted to have fluid overload on CXR. Denies chest pain.MEDS Current:Current Facility-Administered Medications Medication Dose Route Frequency Provider Last Rate Last Admin ? amLODIPine (NORVASC) tablet 10 mg  10 mg Oral Daily Sedaliu, Tomor, MD   10 mg at 01/03/23 1610 ? ampicillin-sulbactam (UNASYN) 1.5 g in sodium chloride 0.9% 50 mL (mini-bag plus)  1.5 g Intravenous Q8H Sedaliu, Tomor, MD 200 mL/hr at 01/03/23 1336 1.5 g at 01/03/23 1336 ? ferrous sulfate (FEOSOL) tablet 325 mg  325 mg Oral BID WC Giuran-Benetato, Nena Polio, MD   325 mg at 01/03/23 1754 ? heparin (PORCINE) injection 5,000 Units  5,000 Units Subcutaneous Q8H Sedaliu, Tomor, MD   5,000 Units at 01/03/23 1337 ? hydrALAZINE (APRESOLINE) tablet 25 mg  25 mg Oral 3 times daily Sedaliu, Tomor, MD   25 mg at 01/03/23 2044 ? metoprolol succinate XL (TOPROL-XL) 24 hr tablet 25 mg  25 mg Oral Daily Sedaliu, Tomor, MD   25 mg at 01/03/23 0820 ? polyethylene glycol (MIRALAX) packet 17 g  17 g Oral Daily Giuran-Benetato, Nena Polio, MD   17 g at 01/03/23 1754 ? rosuvastatin (CRESTOR) tablet 5 mg  5 mg Oral Daily Sedaliu, Tomor, MD   5 mg at 01/03/23 0820 ? sodium chloride 0.9 % flush 3 mL  3 mL IV Push Q8H Sedaliu, Tomor, MD   3 mL at 01/02/23 2142 ? [START ON 01/04/2023] Vancomycin MAR Level   Intravenous Once Giuran-Benetato, Nena Polio, MD     ? Vancomycin Therapy Placeholder   Intravenous placeholder Eric Form, MD     I/O's:Intake/Output Summary (Last 24 hours) at 01/03/2023 2058Last data filed at 01/03/2023 1800Gross per 24 hour Intake 240 ml Output 2700 ml Net -2460 ml PE:  Vitals: Temp:  [97.6 ?F (36.4 ?C)-99.4 ?F (37.4 ?C)] 97.6 ?F (36.4 ?C)Pulse:  [72-87] 83Resp:  [16-20] 20BP: (130-172)/(57-72) 172/57SpO2:  [94 %-97 %] 94 % SpO2:  [94 %-97 %] 94 % (01/10 2021)  GEN: mild tachypnea, resting in bed, awake  NERUO:  Awake, alert and oriented.  No focal deficits  CHEST: mildly tachyneic, decreased BS at bases b/l.  CVS: RRR, -M/G/R,   ABD: +BS, soft, NT  EXT: -edema, +pedal pulsesLabs:Labs:Pending Labs and Tests: Pending Lab Results   Order Current Status  Blood culture #1 Preliminary result  Blood culture #2 Preliminary result  RUE:AVWUJW Labs Lab 01/07/241137 01/08/240904 01/08/241232 01/09/240512 01/10/240614 WBC 11.8* 9.8 8.7 7.8 9.6 HGB 13.2 10.0* 10.8* 9.3* 9.5* PLT 113* 97* 93* 88* 115* MCV 89.5 89.3 89.5 89.8 90.8 JXB:JYNWGN Labs Lab 01/07/241120 01/07/241550 01/07/242055 01/08/241232 01/09/240513 01/10/240614 NA 129* 126* 129* 130* 131* 133* K 3.9 3.6 3.5 3.2* 3.0* 2.9* CL 93* 91* 95* 96* 98 100 CO2 19* 16* 17* 20* 20* 18* ANIONGAP 17* 19* 17* 14 13 15  CALCIUM 8.1* 8.0* 7.4* 7.5* 6.6* 7.0* MG 1.9  --   --  1.8 1.7* 1.8 PHOS  --   --   --  3.6 3.8 3.9 GLU 99 96 96 110* 142* 125* BUN 48* 51* 54* 55* 52* 44* CREATININE 4.25* 4.06* 4.05* 4.15* 4.30* 4.35* 		LFT: Recent Labs Lab 01/07/241120 01/08/241232 01/09/240513 01/10/240614 AST 42 41 69* 47* ALT 33 33 48 45 ALKPHOS 80 70 80 89 ALBUMIN 2.7* 2.0* 1.6* 1.5*  PROT 7.4 6.4 5.7* 5.7* BILITOT 0.9 0.6 0.5 0.4 BILIDIR  --  0.3 0.3 0.2 Coag: No results for input(s): LABPROT, INR, PTT in the last 168 hours.Metabolic:Recent Labs Lab 01/07/241137 TSH 1.480 ABGs:No results found for: PHART, PCO2, PO2ART, HCO3ART, SAMPLETYPEECG/Tele Events: Results for orders placed or performed during the hospital encounter of 12/31/22 EKG Result Value Ref Range  Heart Rate 93 bpm QRS Interval 97 ms  QT Interval 387 ms  QTC Interval 482 ms  P Axis 40 deg  QRS Axis 12 deg  T Wave Axis 60 deg  P-R Interval 142 msec  SEVERITY Abnormal ECG severity [place] C/A/P 12/31/2022: Evidence of chronic large airways disease/aspiration including endotracheal debris, linear bibasilar scarring, and diffuse bronchial wall thickening.No evidence of acute abnormality within the chest, abdomen, or pelvis.[place] [facility] Notify System Classification: Routine.?CXRAY:XR CHEST PA OR AP Date: 01/03/2023 4:26 PM?INDICATION: mild wheezing r/o fluid overload?COMPARISON: XR CHEST PA AND LATERAL 2022-12-31?FINDINGS:?Support Devices: None.?There is mild fluid overload, central pulmonary vascular prominence and perihilar haziness. No pneumothorax or pleural effusion. Stable cardiomediastinal silhouette.?IMPRESSION: ?Mild fluid overload.?Reported and signed by: Maudie Flakes, MD  [facility] Biomedical ImagingA/P: 72 year old male with h/o HTN and chronic large airway disease, admitted for left LE cellulitis, on Unasyn and Vancomycin.  C/o shortness of breath with mild fluid noted on CXR this afternoon, O2 sat 94% on room air.-Duoneb-lasix-EKG-CBC, CMP, BNP, d-dimer, troponinTime spent with patient 30 minutes managing critical diagnosis of resp distressJennifer R Baker-Daniels, PAMHB

## 2023-01-04 NOTE — Progress Notes
[facility]	Medicine Progress NoteAttending Provider: Loman Chroman * PCP: Chales Abrahams, PiyushInterim History: I have seen and examined the patient. Patients overnight issues reviewed with nursing. Chart reviewed. Examined patient , Reviewed electronic chart for Vitals,Labs,Diagnostics,MAR.Reviewed care notes, discussed with nursing and care coordination team .Reviewed previous notes- summation of old records,reviewed pertinent radiology reports, reviewed pertinent medicine tests .Established problems - see A&P (>4)Subjective: The patient is very weak refusing pain meds Review of Allergies/Meds/Hx: I have reviewed the patient's: allergies, current scheduled medications, current infusions, current prn medications, past medical history and past surgical historyObjective: Vitals:  01/04/23 1452 BP: (!) 166/69 Pulse: 86 Resp: 18 Temp: 98.6 ?F (37 ?C) Weight:   Intake/Output Summary (Last 24 hours) at 01/04/2023 1530Last data filed at 01/04/2023 1400Gross per 24 hour Intake 486 ml Output 4450 ml Net -3964 ml  Physical Exam Constitutional: NADMouth/Throat: Oropharynx is clear and moist. Eyes: EOM are normal. Cardiovascular: Normal rate, regular rhythm and normal heart sounds.  No murmur.Pulmonary/Chest: Effort: normal; breath sounds: normal. No wheezing, no rales. Abdominal: Soft. Bowel sounds are normal,nontender,nondistended.Musculoskeletal:no acute inflammation, no tenderness. Neurological:alert and oriented to person, place, and time. Skin:  Left lower extremity cellulitisPsychiatric: normal mood and affect;behavior is normal. Labs:CBC & BJY:NWGNFA Labs Lab 01/10/240614 01/11/240012 01/11/240436 NA 133* 133* 131* K 2.9* 3.2* 3.2* CL 100 101 99 CO2 18* 19* 18* BUN 44* 40* 39* CREATININE 4.35* 4.14* 4.09* GLU 125* 163* 175* CALCIUM 7.0* 7.1* 6.9* WBC 9.6 13.2* 13.2* HGB 9.5* 10.2* 10.1* HCT 27.60* 29.60* 29.60* PLT 115* 183 182 Last 24 hours: Recent Results (from the past 24 hour(s)) EKG  Collection Time: 01/03/23  9:35 PM Result Value Ref Range  Heart Rate 92 bpm  QRS Interval 94 ms  QT Interval 374 ms  QTC Interval 462 ms  P Axis 42 deg  QRS Axis 28 deg  T Wave Axis 39 deg  P-R Interval 122 msec  SEVERITY Otherwise Normal ECG severity NT-proBrain natriuretic peptide  Collection Time: 01/04/23 12:12 AM Result Value Ref Range  NT-proBNP 9,512.0 (H) <125.0 pg/mL CBC without differential  Collection Time: 01/04/23 12:12 AM Result Value Ref Range  WBC 13.2 (H) 4.0 - 11.0 x1000/?L  RBC 3.31 (L) 4.00 - 6.00 M/?L  Hemoglobin 10.2 (L) 13.2 - 17.1 g/dL  Hematocrit 21.30 (L) 86.57 - 50.00 %  MCV 89.4 80.0 - 100.0 fL  MCH 30.8 27.0 - 33.0 pg  MCHC 34.5 31.0 - 36.0 g/dL  RDW-CV 84.6 96.2 - 95.2 %  Platelets 183 150 - 420 x1000/?L  MPV 11.8 8.0 - 12.0 fL  ANC(Abs Neutrophil Count) 11.21 (H) 2.00 - 7.60 x 1000/?L Comprehensive metabolic panel  Collection Time: 01/04/23 12:12 AM Result Value Ref Range  Sodium 133 (L) 136 - 145 mmol/L  Potassium 3.2 (L) 3.5 - 5.1 mmol/L  Chloride 101 98 - 107 mmol/L  CO2 19 (L) 21 - 32 mmol/L  Anion Gap 13 5 - 16  Glucose 163 (H) 70 - 100 mg/dL  BUN 40 (H) 7 - 18 mg/dL  Creatinine 8.41 (H) 3.24 - 1.30 mg/dL  Calcium 7.1 (L) 8.5 - 10.5 mg/dL  BUN/Creatinine Ratio 9.7 8.0 - 23.0  Total Protein 6.5 6.0 - 8.0 g/dL  Albumin 1.7 (L) 3.4 - 5.0 g/dL  Total Bilirubin 0.3 0.2 - 1.2 mg/dL  Alkaline Phosphatase 401 46 - 116 U/L  Alanine Aminotransferase (ALT) 50 14 - 63 U/L  Aspartate Aminotransferase (AST) 58 (H) 10 - 42 U/L  Globulin 4.8 (H) 2.3 -  3.5 g/dL  A/G Ratio 0.4 (L) 1.0 - 2.2  AST/ALT Ratio 1.2 Reference Range Not Established  eGFR (Creatinine) 15 (L) >=60 mL/min/1.26m2 D-dimer, quantitative Collection Time: 01/04/23 12:12 AM Result Value Ref Range  D-Dimer 7.31 (H) <=0.69 mg/L FEU Troponin T High Sensitivity, Inpatient; 0 hour baseline AND 1 hour with reflex (3 hour)  Collection Time: 01/04/23 12:12 AM Result Value Ref Range  High Sensitivity Troponin T 36 (H) See Comment ng/L Troponin T High Sensitivity, 1 Hour With Reflex (BH GH LMW [facility])  Collection Time: 01/04/23  1:43 AM Result Value Ref Range  High Sensitivity Troponin T 37 (H) See Comment ng/L  1 hour Delta from 0 Hour, HS-Troponin T 1 ng/L Vancomycin, random     (BH GH LMW [facility])  Collection Time: 01/04/23  4:36 AM Result Value Ref Range  Vancomycin Random 16.5 Reference Range Not Established ug/mL Magnesium  Collection Time: 01/04/23  4:36 AM Result Value Ref Range  Magnesium 1.5 (L) 1.8 - 2.5 mg/dL Phosphorus     (BH GH L LMW Y[facility])  Collection Time: 01/04/23  4:36 AM Result Value Ref Range  Phosphorus 3.2 2.5 - 4.9 mg/dL CBC auto differential  Collection Time: 01/04/23  4:36 AM Result Value Ref Range  WBC 13.2 (H) 4.0 - 11.0 x1000/?L  RBC 3.32 (L) 4.00 - 6.00 M/?L  Hemoglobin 10.1 (L) 13.2 - 17.1 g/dL  Hematocrit 98.11 (L) 91.47 - 50.00 %  MCV 89.2 80.0 - 100.0 fL  MCH 30.4 27.0 - 33.0 pg  MCHC 34.1 31.0 - 36.0 g/dL  RDW-CV 82.9 56.2 - 13.0 %  Platelets 182 150 - 420 x1000/?L  MPV 11.6 8.0 - 12.0 fL  Neutrophils 84.0 (H) 39.0 - 72.0 %  Lymphocytes 5.7 (L) 17.0 - 50.0 %  Monocytes 7.0 4.0 - 12.0 %  Eosinophils 1.5 0.0 - 5.0 %  Basophil 0.2 0.0 - 1.4 %  Immature Granulocytes 1.6 (H) 0.0 - 1.0 %  nRBC 0.0 0.0 - 1.0 %  ANC(Abs Neutrophil Count) 11.11 (H) 2.00 - 7.60 x 1000/?L  Absolute Lymphocyte Count 0.75 0.60 - 3.70 x 1000/?L  Monocyte Absolute Count 0.92 0.00 - 1.00 x 1000/?L  Eosinophil Absolute Count 0.20 0.00 - 1.00 x 1000/?L  Basophil Absolute Count 0.03 0.00 - 1.00 x 1000/?L  Absolute Immature Granulocyte Count 0.21 0.00 - 0.30 x 1000/?L Absolute nRBC 0.00 0.00 - 1.00 x 1000/?L Basic metabolic panel  Collection Time: 01/04/23  4:36 AM Result Value Ref Range  Sodium 131 (L) 136 - 145 mmol/L  Potassium 3.2 (L) 3.5 - 5.1 mmol/L  Chloride 99 98 - 107 mmol/L  CO2 18 (L) 21 - 32 mmol/L  Anion Gap 14 5 - 16  Glucose 175 (H) 70 - 100 mg/dL  BUN 39 (H) 7 - 18 mg/dL  Creatinine 8.65 (H) 7.84 - 1.30 mg/dL  Calcium 6.9 (LL) 8.5 - 10.5 mg/dL  BUN/Creatinine Ratio 9.5 8.0 - 23.0  eGFR (Creatinine) 15 (L) >=60 mL/min/1.24m2 Troponin T High Sensitivity, 3 Hour (BH GH LMW [facility])  Collection Time: 01/04/23  4:36 AM Result Value Ref Range  High Sensitivity Troponin T 36 (H) See Comment ng/L  1 hour Delta from 0 Hour, HS-Troponin T 1 ng/L  3 hour Delta from 0 Hour, HS-Troponin T 0 ng/L XR Chest PA or AP Final Result    Mild fluid overload.  Reported and signed by: Maudie Flakes, MD    [facility] Radiology and Biomedical Imaging   Echo 2D Complete w Doppler and CFI  if Ind Image Enhancement 3D and or bubbles Final Result  US Renal Final Result    Unremarkable appearing renal ultrasound  Reported and signed by: Donivan Scull, MD    [facility] Radiology and Biomedical Imaging   US Duplex Lower Extremity Venous Bilat Final Result   No evidence of deep venous thrombosis in bilateral femoral/popliteal veins, within above limitations.  Please note that ultrasound is less sensitive for evaluation of thrombus in calf veins. If there is continued clinical concern, re-evaluation can be performed after 5-7 days to evaluate for propagation of an unseen thrombus from the calf.  [place] Radiology Notify System Classification: Routine.  Report initiated by:  Vincenza Hews, MD  Reported and signed by: Alberteen Sam, MD    [facility] Radiology and Biomedical Imaging   [place] ED Chest Abdomen Pelvis wo IV Contrast Final Result   Evidence of chronic large airways disease/aspiration including endotracheal debris, linear bibasilar scarring, and diffuse bronchial wall thickening.  No evidence of acute abnormality within the chest, abdomen, or pelvis.  [place] Radiology Notify System Classification: Routine.  Report initiated by:  Vincenza Hews, MD  Reported and signed by: Alberteen Sam, MD    [facility] Radiology and Biomedical Imaging   [place] Cervical Spine wo IV Contrast Final Result 1. No evidence of acute intracranial abnormality.   2. No evidence for acute cervical spine fracture or traumatic subluxation.  Please note that Noncontrast Head [place] is not sensitive for the detection of ischemic infarct. If ischemic infarct is of clinical concern, additional clinical or imaging evaluation is recommended.   [place] Radiology Notify System Classification: Routine.  Report initiated by:  Vincenza Hews, MD  Reported and signed by: Lavera Guise, MD    [facility] Radiology and Biomedical Imaging   CXR Final Result   No acute cardiothoracic abnormality.  [place] Radiology Notify System Classification: Routine.  Reported and signed by: Alberteen Sam, MD    [facility] Radiology and Biomedical Imaging   Echo 2D Complete w Doppler and CFI if Ind Image Enhancement 3D and or bubblesResult Date: 01/01/2023~ * Very limited study.* Suboptimal visualization of the left ventricle, but it appears to be normal size with normal function.  LVEF estimated by visual assessment was between 55-60%.  Cannot rule out wall motion abnormalities.* Normal right ventricular cavity size and systolic function.* No significant valve abnormalities on this limited study.US RenalResult Date: 1/8/2024RENAL ULTRASOUND WITH DOPPLER HISTORY:  aki. COMPARISON:   Ultrasound 11/13/2021 TECHNIQUE:  Multiple longitudinal and transverse images of the kidneys were performed using a high-resolution linear array transducer. FINDINGS: BLADDER:  The urinary bladder is incompletely distended. The prostate gland is not enlarged.  Bilateral ureteral jets are identified. RIGHT KIDNEY:  Right kidney measures 8.6 cm in length.  The renal echogenicity appears unremarkable. No stones, hydronephrosis or suspicious appearing mass lesions are identified. There are no perinephric collections. LEFT KIDNEY:  Left kidney measures 11.3  cm in length.  The renal echogenicity appears unremarkable. No stones, hydronephrosis or suspicious appearing mass lesions are identified. There are no perinephric collections.  DOPPLER:    Renal vascular color flow is identified    Unremarkable appearing renal ultrasound Reported and signed by: Donivan Scull, MD  [facility] Radiology and Biomedical Imaging [place] ED Chest Abdomen Pelvis wo IV ContrastResult Date: 1/7/2024CT ED CHEST ABDOMEN PELVIS WO IV CONTRAST HISTORY/INDICATION: Dyspnea status post fall. COMPARISON: NONE TECHNIQUE: [place] images of the chest, abdomen, and pelvis were obtained from the thoracic inlet to the pubic symphysis without intravenous contrast. Coronal and  sagittal multiplanar reformatted images of the chest, abdomen and pelvis were provided. FINDINGS: CLAVICLES & SCAPULAE: Unremarkable RIB CAGE & STERNUM: No acute osseous injury. MEDIASTINUM: Unremarkable. LUNGS/AIRWAYS/PLEURA: Evidence of the large airways disease/possible chronic aspiration including mild diffuse bronchial wall thickening, linear bibasilar scarring, and a small amount of endotracheal debris is seen just proximal to the origin of the right mainstem bronchus (image 193 series 4). VASCULATURE: The aorta and its major branches are unremarkable. LYMPH NODES: No enlarged lymph nodes. LIVER & BILIARY: Unremarkable. SPLEEN: Unremarkable. PANCREAS: Unremarkable. ADRENAL GLANDS: Unremarkable. KIDNEYS: Atrophic right kidney.[place] Kitchen Exophytic left renal cyst. BOWEL: Unremarkable. URINARY BLADDER: Unremarkable. PELVIC VISCERA: Unremarkable. VASCULATURE: IVC filter. THORACIC SPINE: No acute osseous injury. LUMBAR SPINE: No acute osseous injury. Chronic appearing grade 1 spondylolisthesis of L5 over S1. Chronic bilateral L5 pars defects. SACRUM & PELVIS: Status post right hip arthroplasty. Severe degenerative changes noted at the left hip. SOFT TISSUES: Bilateral gynecomastia.  Evidence of chronic large airways disease/aspiration including endotracheal debris, linear bibasilar scarring, and diffuse bronchial wall thickening. No evidence of acute abnormality within the chest, abdomen, or pelvis. [place] Radiology Notify System Classification: Routine. Report initiated by:  Vincenza Hews, MD Reported and signed by: Alberteen Sam, MD  [facility] Radiology and Biomedical Imaging US Duplex Lower Extremity Venous BilatResult Date: 1/7/2024US DUPLEX LOWER EXTREMITY VENOUS BILATERAL. HISTORY: Pain and swelling COMPARISON: None. TECHNIQUE: Grayscale, color and pulsed Doppler imaging were performed.  FINDINGS: There is symmetric phasicity in the lower external iliac veins. RIGHT LOWER EXTREMITY: COMMON FEMORAL VEIN: No thrombus. GREAT SAPHENOUS ORIGIN: No thrombus. UPPER PROFUNDA FEMORAL VEIN: No thrombus. FEMORAL VEIN: No thrombus. POPLITEAL VEIN: No thrombus. Appropriate augmentation to flow is noted in the popliteal vein. TIBIOPERONEAL TRUNK: No thrombus. POSTERIOR TIBIAL VEINS: Not well visualized. PERONEAL VEINS: Not well visualized.  LEFT LOWER EXTREMITY: COMMON FEMORAL VEIN: No thrombus. GREAT SAPHENOUS ORIGIN: No thrombus. UPPER PROFUNDA FEMORAL VEIN: No thrombus. FEMORAL VEIN: No thrombus. POPLITEAL VEIN: No thrombus. Appropriate augmentation to flow is noted in the popliteal vein. TIBIOPERONEAL TRUNK: Not well visualized. POSTERIOR TIBIAL VEINS: Not well visualized.   PERONEAL VEINS: Not well visualized.     No evidence of deep venous thrombosis in bilateral femoral/popliteal veins, within above limitations. Please note that ultrasound is less sensitive for evaluation of thrombus in calf veins. If there is continued clinical concern, re-evaluation can be performed after 5-7 days to evaluate for propagation of an unseen thrombus from the calf. [place] Radiology Notify System Classification: Routine. Report initiated by:  Vincenza Hews, MD Reported and signed by: Alberteen Sam, MD  [facility] Radiology and Biomedical Imaging [place] Cervical Spine wo IV ContrastResult Date: 1/7/2024CT HEAD CERVICAL SPINE WO IV CONTRAST [facility]) INDICATION: Fall with head strike COMPARISON: None TECHNIQUE: [place] images were obtained from the vertex through T1 without intravenous contrast. Coronal and sagittal multiplanar reformatted images were provided. FINDINGS: Brain: There is no intracranial hemorrhage, edema, mass, mass effect or midline shift. There is no evidence of acute major vascular distribution infarct. Periventricular white matter hypodensity is nonspecific, though likely related to chronic small vessel ischemia. The ventricles and sulci are symmetrically enlarged, likely related to diffuse parenchymal volume loss.. The basal cisterns are patent. The paranasal sinuses and mastoid air cells are clear. The visualized orbits and osseous structures are unremarkable, noting left cataract surgery. Cervical Spine: There is no compression deformity or evidence for acute fracture or subluxation. The atlanto-occipital and atlanto-axial articulations are intact. Note is made of multilevel degenerative disc disease changes. 1. No evidence  of acute intracranial abnormality.  2. No evidence for acute cervical spine fracture or traumatic subluxation. Please note that Noncontrast Head  is not sensitive for the detection of ischemic infarct. If ischemic infarct is of clinical concern, additional clinical or imaging evaluation is recommended. [place] Radiology Notify System Classification: Routine. Report initiated by:  Vincenza Hews, MD Reported and signed by: Lavera Guise, MD  [facility] Radiology and Biomedical Imaging CXRResult Date: 1/7/2024XR CHEST PA AND LATERAL INDICATION: fall, weakness COMPARISON: XR CHEST PA OR AP 2022-04-10 FINDINGS:   The cardiomediastinal silhouette is unchanged. Lungs are hypoinflated. No focal consolidation. Mild dependent atelectasis. The pleural spaces are clear. There is no acute osseous injury.  No acute cardiothoracic abnormality. [place] Radiology Notify System Classification: Routine. Reported and signed by: Alberteen Sam, MD  [facility] Radiology and Biomedical Imaging I have reviewed the patient's ECG as resulted in the EMR.Assessment and Plan: Reviewed care notes, discussed with nursing and care coordination team .Reviewed consult notes .>30 mins in review of chart and exam of pt and preparation of note, discussion w team members, > 50% direct care with patient32 year old male with a past medical histories of obesity hypertension presenting with hypertensive urgency and lower extremity edema and left lower extremity cellulitis as well as acute kidney injury.# left lower extremity cellulitisOn Unasyn vancomycin awaiting culture and sensitivityPain Management# acute kidney injury, hypokalemia, hyponatremia Ultrasound with no obstruction or hydronephrosisSupplement potassium chloride 20 milliequivalents  Continue isotonic fluidsPossible component dehydration urine sodium < 40  Usm mor ethan 400 mOsm  Very low albuminpotassium chloride 40 milliequivalents swan oral will recheck electrolytes in a.m.# hypertensive urgency resolvedContinue amlodipine hydralazine Toprol Crestor# obesity stage III with hypoalbuminemiaNutrition,Will add Nepro t.i.d.DVT prophylaxis in placeNo ACLS There is no immunization history on file for this patient.Signed:have reviewed the consultants recommendations and plan of care. Care was discussed with the nursing and case management teams. The patient was updated regarding the illness, plan of care and disposition plan. All orders were entered electronically. Dinah Beers MD.    [phone] No CodePortions of this document may have been transcribed using speech recognition software. Despite attempts at proofreading this document may contain transcription errors. Please do not hesitate to contact this writer to request clarification.

## 2023-01-04 NOTE — Progress Notes
[facility]	Nephrology Progress NoteAttending Provider: Loman Chroman * Assessment/Plan: Assessment  Marc Evans is a 72 year old male with a medical history of CKD 4, hypertension, hyperlipidemia, BPH with outflow obstruction, hx of bilateral nephrosis with bilateral nephrostomy tubes, s/p HOLEP surgery (05/18/21), history of UTIs with foley catheters, history of epididymoorchitis.  He came in for evaluation of weakness, not eating (but drinking) for three days.  Found to have AKI on CKD.  Left leg cellulitis.  Blood cultures 12/31/22 no growth to date.  ID consulting.?Echo 01/01/23 showed EF 55-60%; limited study; normal LV function and no valvular abnormalities.AKI on CKD, non oliguricBaseline serum creatinine last November thought to be 2.5-3.0.  Etiology of CKD was thought to be related to obstructive nephropathy in 2021, relieved by bilateraly nephrostomy tubes, followed by HOLEP surgery in 05/18/21.  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  UAs 2021-2022 had moderate proteinuria.?Etiology of AKI possibly ATN iso infection vs prerenal/CRS.  [place] AP without iv contrast 12/31/22 shows no obstructive nephropathy.  UA 12/31/22 had 1+ protein, 1+ ketones, 2+ blood, few bacteria.  FeUrea 12/31/22 was 48.5% suggestive of intrinsic renal disease.  UPCR 11/27/22 had 0.940 g/mgCr.  UA 11/27/22 bland except for 1+ protein.?His sCr was 4.09 on 01/04/23 vs 4.35 on 01/03/23, stable to improving.  BUN stable.    Urine output 4.1 L in last 24 hours.  He is on Unasyn IVP and vancomycin IVP.  His vancomycin random level was 16.5 on 01/04/23.?Recommendations/Plan:-Agree with discontinuing IVF for now-Agree with repeat CXR to evaluate fluid status (new wheeze right lung)-Continue to hold losartan-Would hold further diuretics with low threshold to resume of SOB or other evidence of worsening fluid overload-BMP daily-Strict I&O; measure urine output-Bladder scan q shift to rule out retention-Avoid nephrotoxins, including NSAIDS and iv contrast dye, if ableHypokalemiaLikely due to poor oral intake.  Serum potassium 3.2 on 01/04/23.  KCL 40 mEq plus 20 mEq given on 01/04/23 am.-Continue KCL 40 mEq daily; supplement with KCL 20 mEq if low in morning labs-BMP daily  Hypocalcemia?Serum calcium 6.9 and albumin 1.7 on 01/04/23.  Corrected calcium 8.7 mg/dL.-Encourage po intake of food; would discontinue renal diet for now-Consider protein supplements-BMP dailyHyponatremiaSerum sodium 131 on 01/04/23.  Albumin low at 1.7.  Worsening likely due to hypervolemia which should improve with diuresis.-Would continue lasix 40 mg IVP today (01/04/23)-BMP daily     -Encourage intake of food with proteinHypomagnesemiaSerum Mg 1.5 on 01/04/23. Mg oxide 800 mg po given on 01/04/23.-Continue Mg Ox 800 mg daily-Repeat Mg lab dailyAnemia of Chronic Renal DiseaseHgb 10.1 on 01/04/23.  Normocytic anemia.  Iron sat 17 onn 01/03/23.-Would hold off IV iron due to infection-Would start po iron daily-CBC daily  Subjective: Subjective  Interim History:O/N he had SOB and was given lasix, duoneb and repeat labs and EKG.  C/o leg pain and SOB although less than last night.  Denies nausea.  SBP 130-170s.  O2 sat 94-96% on room air.Review of Allergies/Meds/Hx: I have reviewed the patient's: allergies and current scheduled medications Current Facility-Administered Medications Medication Dose Route Frequency Provider Last Rate Last Admin ? amLODIPine (NORVASC) tablet 10 mg  10 mg Oral Daily Sedaliu, Tomor, MD   10 mg at 01/04/23 0849 ? ampicillin-sulbactam (UNASYN) 1.5 g in sodium chloride 0.9% 50 mL (mini-bag plus)  1.5 g Intravenous Q8H Sedaliu, Tomor, MD 200 mL/hr at 01/04/23 0628 1.5 g at 01/04/23 1610 ? ferrous sulfate (FEOSOL) tablet 325  mg  325 mg Oral BID WC Giuran-Benetato, Nena Polio, MD   325 mg at 01/04/23 0848 ? heparin (PORCINE) injection 5,000 Units  5,000 Units Subcutaneous Q8H Sedaliu, Tomor, MD   5,000 Units at 01/04/23 2595 ? hydrALAZINE (APRESOLINE) tablet 25 mg  25 mg Oral 3 times daily Sedaliu, Tomor, MD   25 mg at 01/04/23 0849 ? metoprolol succinate XL (TOPROL-XL) 24 hr tablet 25 mg  25 mg Oral Daily Sedaliu, Tomor, MD   25 mg at 01/04/23 0849 ? polyethylene glycol (MIRALAX) packet 17 g  17 g Oral Daily Giuran-Benetato, Nena Polio, MD   17 g at 01/04/23 6387 ? rosuvastatin (CRESTOR) tablet 5 mg  5 mg Oral Daily Sedaliu, Tomor, MD   5 mg at 01/04/23 0849 ? sodium chloride 0.9 % flush 3 mL  3 mL IV Push Q8H Sedaliu, Tomor, MD   3 mL at 01/04/23 0631 ? tiotropium bromide (SPIRIVA RESPIMAT) 2.5 mcg/actuation inhalation spray 2 Inhalation  2 Inhalation Inhalation Daily Gearldine Bienenstock, [place]   2 Inhalation at 01/04/23 [phone] ? [START ON 01/05/2023] Vancomycin MAR Level   Intravenous Once Giuran-Benetato, Nena Polio, MD     ? Vancomycin Therapy Placeholder   Intravenous placeholder Sedaliu, Tomor, MD     acetaminophen, albuterol **AND** tiotropium bromide, bisacodyL, ondansetron **OR** ondansetron (ZOFRAN) IV Push, oxyCODONE, sodium chloride  Objective  Objective: Vitals:I have reviewed the current vital signs Patient Vitals for the past 24 hrs: BP Temp Temp src Pulse Resp SpO2 01/04/23 0850 (!) 169/79 -- -- 78 -- 96 % 01/04/23 0538 -- -- -- -- -- 98 % 01/04/23 0504 (!) 166/74 97.5 ?F (36.4 ?C) Oral 89 18 94 % 01/04/23 0039 (!) 151/82 97.8 ?F (36.6 ?C) Oral (!) 97 18 95 % 01/03/23 2021 (!) 172/57 97.6 ?F (36.4 ?C) Oral 83 20 94 % 01/03/23 1500 -- 97.9 ?F (36.6 ?C) Oral -- -- -- 01/03/23 1332 (!) 165/68 99.3 ?F (37.4 ?C) Oral 87 18 97 % I/O's:Gross Totals (Last 24 hours) at 01/04/2023 1249Last data filed at 01/04/2023 0631Intake 246 ml Output 4100 ml Net -3854 ml Physical Exam Constitutional: Calm, NAD, AAOX3, speaking full sentences. Eyes  : Not pale, anicteric scleraeENT : moist oral mucosaNeck: No JVD notedCardiovascular: RRR S1 S2, no m/r/gChest: CTAB, diminished at bases.  No rales.  Increased work of breathing.  Is on room air.Abdominal: Large, BS hypo, soft, nt GU : [place] cathExtremities:  Bilateral LE edema +3, L>R, left leg red with cellulitic changesSkin : No rash on limited examNeurological:  Alert and oriented to person, place, and time. Psychiatric: Affect somewhat flatLabs:I have reviewed the current lab results Recent Labs Lab 01/08/241232 01/09/240513 01/10/240614 01/11/240012 01/11/240436 NA 130* 131* 133* 133* 131* K 3.2* 3.0* 2.9* 3.2* 3.2* CL 96* 98 100 101 99 CO2 20* 20* 18* 19* 18* BUN 55* 52* 44* 40* 39* CREATININE 4.15* 4.30* 4.35* 4.14* 4.09* GLU 110* 142* 125* 163* 175* CALCIUM 7.5* 6.6* 7.0* 7.1* 6.9* MG 1.8 1.7* 1.8  --  1.5* PHOS 3.6 3.8 3.9  --  3.2 Recent Labs Lab 01/09/240512 01/10/240614 01/11/240012 01/11/240436 WBC 7.8 9.6 13.2* 13.2* HGB 9.3* 9.5* 10.2* 10.1* HCT 27.20* 27.60* 29.60* 29.60* PLT 88* 115* 183 182 Recent Labs Lab 01/08/241232 01/09/240513 01/10/240614 01/11/240012 ALKPHOS 70 80 89 112 BILITOT 0.6 0.5 0.4 0.3 BILIDIR 0.3 0.3 0.2  --  PROT 6.4 5.7* 5.7* 6.5 ALT 33 48 45 50 AST 41 69* 47* 58*  Recent Labs Lab 01/07/241336 PROTEINUA 2+* UROBILINOGEN <2.0  Signed: Claude Manges, APRN [facility] CenterOffice: [phone] 11, 20249:07 AM

## 2023-01-04 NOTE — Plan of Care
Problem: Adult Inpatient Plan of CareGoal: Plan of Care ReviewOutcome: Interventions implemented as appropriateGoal: Patient-Specific Goal (Individualized)Outcome: Interventions implemented as appropriateGoal: Absence of Hospital-Acquired Illness or InjuryOutcome: Interventions implemented as appropriateGoal: Optimal Comfort and WellbeingOutcome: Interventions implemented as appropriateGoal: Readiness for Transition of CareOutcome: Interventions implemented as appropriate Plan of Care Overview/ Patient Status    Patient alert and forgetful, with L leg cellulitis, edematous, red, elevated on pillow.Noted scattered small amounts of serous drainage from L leg cellulitis.Complains of L leg pain during turning and repositioning, administered Tramadol po x 1 as ordered with good effect.Patient complained of shortness of breath with expiratory wheeze.; O2 sat 94% on RA. Baker-Daniels, APRN, made aware.APRN at bedside to assess patient. Orders place:Ekg doneLasix 40 mg IV administeredLab drawnAll po meds and IV antibiotic administered per MAR.[place] cath in place, voiding adequate amount of cyu.Poc continues.Breathing treatment administered by RT.Lab values as well as calcium level 6.9 (notified to me by Rauf, Alip from lab) were reported to Dr Aileen Fass. MD to look into lab values, will continue to monitor.

## 2023-01-04 NOTE — Plan of Care
Plan of Care Overview/ Patient Status    Lasix 40 mg IV administered last night at 2215Post lasix, urine output was 2450 cyu in external catheter plus  incontinent of urine x 4. Incontinent care provided.Lab values reported to Dr Aileen Fass; all meds administered as ordered by MD.Poc continues

## 2023-01-05 LAB — CBC WITH AUTO DIFFERENTIAL
BKR WAM ABSOLUTE IMMATURE GRANULOCYTES.: 0.17 x 1000/??L (ref 0.00–0.30)
BKR WAM ABSOLUTE LYMPHOCYTE COUNT.: 1.13 x 1000/ÂµL (ref 0.60–3.70)
BKR WAM ABSOLUTE NRBC (2 DEC): 0 x 1000/??L (ref 0.00–1.00)
BKR WAM ANALYZER ANC: 9.54 x 1000/??L — ABNORMAL HIGH (ref 2.00–7.60)
BKR WAM BASOPHIL ABSOLUTE COUNT.: 0.03 x 1000/??L (ref 0.00–1.00)
BKR WAM BASOPHILS: 0.2 % (ref 0.0–1.4)
BKR WAM EOSINOPHIL ABSOLUTE COUNT.: 0.22 x 1000/??L (ref 0.00–1.00)
BKR WAM EOSINOPHILS: 1.8 % (ref 0.0–5.0)
BKR WAM HEMATOCRIT (2 DEC): 28 % — ABNORMAL LOW (ref 38.50–50.00)
BKR WAM HEMOGLOBIN: 9.7 g/dL — ABNORMAL LOW (ref 13.2–17.1)
BKR WAM IMMATURE GRANULOCYTES: 1.4 % — ABNORMAL HIGH (ref 0.0–1.0)
BKR WAM LYMPHOCYTES: 9.3 % — ABNORMAL LOW (ref 17.0–50.0)
BKR WAM MCH (PG): 30.9 pg — ABNORMAL LOW (ref 27.0–33.0)
BKR WAM MCHC: 34.6 g/dL (ref 31.0–36.0)
BKR WAM MCV: 89.2 fL (ref 80.0–100.0)
BKR WAM MONOCYTE ABSOLUTE COUNT.: 1.02 x 1000/??L — ABNORMAL HIGH (ref 0.00–1.00)
BKR WAM MONOCYTES: 8.4 % — ABNORMAL LOW (ref 4.0–12.0)
BKR WAM MPV: 11.4 fL (ref 8.0–12.0)
BKR WAM NEUTROPHILS: 78.9 % — ABNORMAL HIGH (ref 39.0–72.0)
BKR WAM NUCLEATED RED BLOOD CELLS: 0 % (ref 0.0–1.0)
BKR WAM PLATELETS: 251 x1000/ÂµL (ref 150–420)
BKR WAM RDW-CV: 12.5 % (ref 11.0–15.0)
BKR WAM RED BLOOD CELL COUNT.: 3.14 M/??L — ABNORMAL LOW (ref 4.00–6.00)
BKR WAM WHITE BLOOD CELL COUNT: 12.1 x1000/??L — ABNORMAL HIGH (ref 4.0–11.0)

## 2023-01-05 LAB — BASIC METABOLIC PANEL
BKR ANION GAP: 13 g/dL (ref 5–16)
BKR BLOOD UREA NITROGEN: 38 mg/dL — ABNORMAL HIGH (ref 7–18)
BKR BUN / CREAT RATIO: 9.4 (ref 8.0–23.0)
BKR CALCIUM: 7.1 mg/dL — ABNORMAL LOW (ref 8.5–10.5)
BKR CHLORIDE: 100 mmol/L (ref 98–107)
BKR CO2: 20 mmol/L — ABNORMAL LOW (ref 21–32)
BKR CREATININE: 4.04 mg/dL — ABNORMAL HIGH (ref 0.80–1.30)
BKR EGFR, CREATININE (CKD-EPI 2021): 15 mL/min/{1.73_m2} — ABNORMAL LOW (ref >=60)
BKR GLUCOSE: 157 mg/dL — ABNORMAL HIGH (ref 70–100)
BKR POTASSIUM: 3.2 mmol/L — ABNORMAL LOW (ref 3.5–5.1)
BKR SODIUM: 133 mmol/L — ABNORMAL LOW (ref 136–145)

## 2023-01-05 LAB — VANCOMYCIN, RANDOM     (BH GH LMW YH): BKR VANCOMYCIN RANDOM: 13 ug/mL

## 2023-01-05 LAB — MAGNESIUM: BKR MAGNESIUM: 1.5 mg/dL — ABNORMAL LOW (ref 1.8–2.5)

## 2023-01-05 LAB — PHOSPHORUS     (BH GH L LMW YH): BKR PHOSPHORUS: 2.9 mg/dL (ref 2.5–4.9)

## 2023-01-05 MED ORDER — FUROSEMIDE 10 MG/ML INJECTION SOLUTION
10 mg/mL | Freq: Once | INTRAVENOUS | Status: CP
Start: 2023-01-05 — End: ?
  Administered 2023-01-05: 21:00:00 10 mL via INTRAVENOUS

## 2023-01-05 MED ORDER — MAGNESIUM SULFATE 1 GRAM/100 ML IN DEXTROSE 5 % INTRAVENOUS PIGGYBACK
1100 gram/00 mL | Freq: Once | INTRAVENOUS | Status: CP
Start: 2023-01-05 — End: ?
  Administered 2023-01-05: 18:00:00 1 mL/h via INTRAVENOUS

## 2023-01-05 MED ORDER — VANCOMYCIN 1.5 G IN 500 ML IVPB (VIALMATE)
Freq: Once | INTRAVENOUS | Status: CP
Start: 2023-01-05 — End: ?
  Administered 2023-01-05: 17:00:00 500.000 mL/h via INTRAVENOUS

## 2023-01-05 MED ORDER — VANCOMYCIN MAR LEVEL
Freq: Once | INTRAVENOUS | Status: CP
Start: 2023-01-05 — End: ?

## 2023-01-05 MED ORDER — POTASSIUM CHLORIDE ER 20 MEQ TABLET,EXTENDED RELEASE(PART/CRYST)
20 MEQ | Freq: Once | ORAL | Status: CP
Start: 2023-01-05 — End: ?
  Administered 2023-01-05: 18:00:00 20 MEQ via ORAL

## 2023-01-05 MED ORDER — POTASSIUM CHLORIDE ER 20 MEQ TABLET,EXTENDED RELEASE(PART/CRYST)
20 MEQ | Freq: Once | ORAL | Status: CP
Start: 2023-01-05 — End: ?
  Administered 2023-01-05: 21:00:00 20 MEQ via ORAL

## 2023-01-05 NOTE — Plan of Care
Problem: Adult Inpatient Plan of CareGoal: Plan of Care ReviewOutcome: Interventions implemented as appropriateGoal: Patient-Specific Goal (Individualized)Outcome: Interventions implemented as appropriateGoal: Absence of Hospital-Acquired Illness or InjuryOutcome: Interventions implemented as appropriateGoal: Optimal Comfort and WellbeingOutcome: Interventions implemented as appropriateGoal: Readiness for Transition of CareOutcome: Interventions implemented as appropriate Problem: Physical Therapy GoalsGoal: Physical Therapy GoalsDescription: Pt will complete bed mobility with modified (I) Pt will complete sit to stand transfers with RW and supervision Pt will ambulate 50 ft with RW and supervision Outcome: Interventions implemented as appropriate Problem: Skin Injury Risk IncreasedGoal: Skin Health and IntegrityOutcome: Interventions implemented as appropriate Problem: Fall Injury RiskGoal: Absence of Fall and Fall-Related InjuryOutcome: Interventions implemented as appropriate Plan of Care Overview/ Patient Status    Assumed care of patient at 16:00 to 19:00. A&Ox4. Dyspneic at rest. RR 24, saturation 93% on RA. IVP 40 mg lasix administered. Pre lasix foley 650 ml clear yellow urine. One hour after lasix urine output 300 ml. Call light handy. POc continues.

## 2023-01-05 NOTE — Progress Notes
VANCOMYCIN LEVEL EVALUATIONCurrent Vancomycin Order: 1.5 g IV per laboratory/placeholder data. Day of Therapy: 6Vancomycin Indication: Suspected MRSA infection, cellulitisEstimated Creatinine Clearance: 18 mL/min (A) (by C-G formula based on SCr of 4.04 mg/dL (H)).Creatinine (mg/dL) Date Value 19/14/7829 4.04 (H) 01/04/2023 4.09 (H) 01/04/2023 4.14 (H)  Renal Function: AKILevel: Lab Results Last 72 Hours Component Value Date/Time  Vancomycin Random 13.0 01/05/2023 06:32 AM  Vancomycin Random 16.5 01/04/2023 04:36 AM Type of Level: Random (72 hours post-dose)Based on vancomycin level obtained, recommend:   vancomycin 1500 mg iv x 1Repeat Level:  11/07/23 06:00ID/AST Consulted? Yes [facility] Vancomycin Dosing GuidelineBH Vancomycin Dosing GuidelineFor questions, please contact the pharmacist: Jani Gravel, PharmD    Phone/Mobile Heartbeat: [facility]

## 2023-01-05 NOTE — Plan of Care
Plan of Care Overview/ Patient Status    Assume care 1530-1900Patient alert and oriented, observed resting comfortably. Medication given as ordered. No SOB noted. Denies pain/discomfort, continue with POC, maintain safety

## 2023-01-05 NOTE — Plan of Care
Plan of Care Overview/ Patient Status    Assumed care from 7p-11pProblem: Adult Inpatient Plan of CareGoal: Plan of Care ReviewOutcome: Interventions implemented as appropriateGoal: Patient-Specific Goal (Individualized)Outcome: Interventions implemented as appropriateGoal: Absence of Hospital-Acquired Illness or InjuryOutcome: Interventions implemented as appropriateGoal: Optimal Comfort and WellbeingOutcome: Interventions implemented as appropriateGoal: Readiness for Transition of CareOutcome: Interventions implemented as appropriateVitals:  01/04/23 2034 BP: (!) 150/80 Pulse: 88 Resp: 17 Temp: 98.7 F (37.1 C) SpO2: 94% Patient alert and oriented, denies any pain or discomfort, remains on RA, in no acute resp distress. [place] cath in place, voided 800 cc cyu. All meds including IV antibiotic administered per MAR. Patient in bed, resting comfortably at this time. Poc continues.

## 2023-01-05 NOTE — Plan of Care
Problem: Adult Inpatient Plan of CareGoal: Plan of Care ReviewOutcome: Interventions implemented as appropriate Plan of Care Overview/ Patient Status    11p-7a Alert and oriented, but is forgetful. Safety maintained and is encouraged to call for help if needed. Call bell is within reach. Remained in bed. Increased pain with movement in bed and states minimal pain when asked. LLE- errythema, edema and warm to touch. Abx continued according to [facility]. Voiding- external cath in place. Stated improvement in shortness of breath, coughing noted. Education provided on fluid intake and fluid overload. Will continue to monitor pt during shift. Electronically Signed by Nelva Nay, RN, January 05, 2023

## 2023-01-05 NOTE — Progress Notes
[facility]	Nephrology Progress NoteAttending Provider: Loman Chroman * Assessment/Plan: Assessment  Marc Evans is a 72 year old male with a medical history of CKD 4, hypertension, hyperlipidemia, BPH with outflow obstruction, hx of bilateral nephrosis with bilateral nephrostomy tubes, s/p HOLEP surgery (05/18/21), history of UTIs with foley catheters, history of epididymoorchitis.  He came in for evaluation of weakness, not eating (but drinking) for three days.  Found to have AKI on CKD.  Left leg cellulitis.  Blood cultures 12/31/22 no growth to date.  ID consulting.?Echo 01/01/23 showed EF 55-60%; limited study; normal LV function and no valvular abnormalities.  Repeat CXR 01/03/23 showed mild fluid overload.AKI on CKD, non oliguricBaseline serum creatinine last November thought to be 2.5-3.0.  Etiology of CKD was thought to be related to obstructive nephropathy in 2021, relieved by bilateraly nephrostomy tubes, followed by HOLEP surgery in 05/18/21.  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  UAs 2021-2022 had moderate proteinuria.?Etiology of AKI possibly ATN iso infection vs prerenal/CRS.  [place] AP without iv contrast 12/31/22 shows no obstructive nephropathy.  UA 12/31/22 had 1+ protein, 1+ ketones, 2+ blood, few bacteria.  FeUrea 12/31/22 was 48.5% suggestive of intrinsic renal disease.  UPCR 11/27/22 had 0.940 g/mgCr.  UA 11/27/22 bland except for 1+ protein.?His sCr was 4.09 on 01/04/23 vs 4.35 on 01/03/23, stable to improving.  BUN stable.    Urine output 4.1 L in last 24 hours.  He is on Unasyn IVP and vancomycin IVP.  His vancomycin random level was 16.5 on 01/04/23.?Recommendations/Plan:-Continue to hold losartan-BMP daily-Strict I&O; measure urine output-Bladder scan q shift-Avoid nephrotoxins, including NSAIDS and iv contrast dye, if ableHypokalemiaLikely due to poor oral intake.  Serum potassium 3.2 on 1/11-1/12.  KCL 40 mEq given on 1/12.-Continue KCL 40 mEq daily; supplement with KCL 20 mEq if low in morning labs-BMP daily  Hypocalcemia?Serum calcium 7.1 on 01/05/23 and albumin 1.7 on 01/04/23.  Corrected calcium 8.9 mg/dL.-Encourage po intake of food-BMP dailyHyponatremiaSerum sodium 133 on 01/05/23.  Worsening likely due to hypervolemia which should improve with diuresis.-Would continue lasix 40 mg IVP today (01/05/23)-BMP daily-Continue protein supplementsHypomagnesemiaSerum Mg 1.5 on 1/11-1/12. Mg oxide 800 mg po given on 01/04/23.  Mg sulfate IVP 1g given on 01/05/23.-Restart Mg Ox 800 mg daily tomorrow (01/06/23)-Repeat Mg lab dailyAnemia of Chronic Renal DiseaseHgb 9.7 on 01/05/23.  Normocytic anemia.  Iron sat 17 on 01/03/23.-Would hold off IV iron due to infection-Continue po iron daily-CBC daily  Subjective: Subjective  Interim History:O/N no acute events.  Seen this afternoon.  C/o some nausea after working with PT.  Denies SOB.  SBP 140-170s.  O2 sat 93-94% on room air.Review of Allergies/Meds/Hx: I have reviewed the patient's: allergies and current scheduled medications Current Facility-Administered Medications Medication Dose Route Frequency Provider Last Rate Last Admin ? amLODIPine (NORVASC) tablet 10 mg  10 mg Oral Daily Sedaliu, Tomor, MD   10 mg at 01/05/23 0850 ? ampicillin-sulbactam (UNASYN) 1.5 g in sodium chloride 0.9% 50 mL (mini-bag plus)  1.5 g Intravenous Q8H Sedaliu, Tomor, MD 200 mL/hr at 01/05/23 0620 1.5 g at 01/05/23 1610 ? ferrous sulfate (FEOSOL) tablet 325 mg  325 mg Oral BID WC Giuran-Benetato, Nena Polio, MD   325 mg at 01/05/23 0850 ? heparin (PORCINE) injection 5,000 Units  5,000 Units Subcutaneous Q8H Sedaliu, Tomor, MD   5,000 Units at 01/05/23 9604 ? hydrALAZINE (APRESOLINE) tablet 25 mg  25 mg Oral 3 times daily  Dacie Mandel Form, MD   25 mg at 01/05/23 0850 ? magnesium sulfate IVPB 1 g  1 g Intravenous Once Giuran-Benetato, Nena Polio, MD     ? metoprolol succinate XL (TOPROL-XL) 24 hr tablet 25 mg  25 mg Oral Daily Sedaliu, Tomor, MD   25 mg at 01/05/23 0850 ? polyethylene glycol (MIRALAX) packet 17 g  17 g Oral Daily Giuran-Benetato, Nena Polio, MD   17 g at 01/05/23 0850 ? potassium chloride SA (K-DUR,KLOR-CON M) 24 hr tablet 40 mEq  40 mEq Oral Once Giuran-Benetato, Nena Polio, MD     ? rosuvastatin (CRESTOR) tablet 5 mg  5 mg Oral Daily Sedaliu, Tomor, MD   5 mg at 01/05/23 0850 ? sodium chloride 0.9 % flush 3 mL  3 mL IV Push Q8H Sedaliu, Tomor, MD   3 mL at 01/05/23 0620 ? tiotropium bromide (SPIRIVA RESPIMAT) 2.5 mcg/actuation inhalation spray 2 Inhalation  2 Inhalation Inhalation Daily Gearldine Bienenstock, [place]   2 Inhalation at 01/05/23 [phone] ? vancomycin (VANCOCIN) 1.5 g in sodium chloride 0.9% 500 mL IVPB (vialmate)  1.5 g Intravenous Once Giuran-Benetato, Nena Polio, MD 333.3 mL/hr at 01/05/23 1136 1.5 g at 01/05/23 1136 ? [START ON 01/06/2023] Vancomycin MAR Level   Intravenous Once Giuran-Benetato, Nena Polio, MD     ? Vancomycin Therapy Placeholder   Intravenous placeholder Sedaliu, Tomor, MD     acetaminophen, albuterol **AND** tiotropium bromide, bisacodyL, ondansetron **OR** ondansetron (ZOFRAN) IV Push, oxyCODONE, sodium chloride  Objective  Objective: Vitals:I have reviewed the current vital signs Patient Vitals for the past 24 hrs: BP Temp Temp src Pulse Resp SpO2 01/05/23 0548 (!) 147/76 97.6 ?F (36.4 ?C) Oral 89 18 94 % 01/05/23 0214 (!) 145/76 98 ?F (36.7 ?C) Oral 90 18 94 % 01/04/23 2034 (!) 150/80 98.7 ?F (37.1 ?C) Axillary 88 17 94 % 01/04/23 1742 (!) 145/65 -- -- 89 (!) 24 (!) 93 % 01/04/23 1452 (!) 166/69 98.6 ?F (37 ?C) Oral 86 18 94 % I/O's:Gross Totals (Last 24 hours) at 01/05/2023 1145Last data filed at 01/05/2023 0620Intake 1423.01 ml Output 4400 ml Net -2976.99 ml Physical Exam Constitutional: Calm, NAD, AAOX3, speaking full sentences. Eyes  : Not pale, anicteric scleraeENT : moist oral mucosaNeck: No JVD notedCardiovascular: RRR S1 S2, no m/r/gChest: CTAB, diminished at bases.  No rales.  Increased work of breathing.  Is on room air.Abdominal: Large, BS hypo, soft, nt GU : [place] cathExtremities:  Bilateral LE edema +3, L>R, left leg red with cellulitic changesSkin : No rash on limited examNeurological:  Alert and oriented to person, place, and time. Psychiatric: Affect somewhat flatLabs:I have reviewed the current lab results Recent Labs Lab 01/09/240513 01/10/240614 01/11/240012 01/11/240436 01/12/240632 NA 131* 133* 133* 131* 133* K 3.0* 2.9* 3.2* 3.2* 3.2* CL 98 100 101 99 100 CO2 20* 18* 19* 18* 20* BUN 52* 44* 40* 39* 38* CREATININE 4.30* 4.35* 4.14* 4.09* 4.04* GLU 142* 125* 163* 175* 157* CALCIUM 6.6* 7.0* 7.1* 6.9* 7.1* MG 1.7* 1.8  --  1.5* 1.5* PHOS 3.8 3.9  --  3.2 2.9 Recent Labs Lab 01/10/240614 01/11/240012 01/11/240436 01/12/240632 WBC 9.6 13.2* 13.2* 12.1* HGB 9.5* 10.2* 10.1* 9.7* HCT 27.60* 29.60* 29.60* 28.00* PLT 115* 183 182 251 Recent Labs Lab 01/08/241232 01/09/240513 01/10/240614 01/11/240012 ALKPHOS 70 80 89 112 BILITOT 0.6 0.5 0.4 0.3 BILIDIR 0.3 0.3 0.2  --  PROT 6.4 5.7* 5.7* 6.5 ALT 33 48 45 50 AST 41 69* 47* 58*  Recent Labs Lab 01/07/241336 PROTEINUA 2+*  UROBILINOGEN <2.0    Signed: Claude Manges, APRN [facility] CenterOffice: [phone] 12, 20249:07 AM

## 2023-01-05 NOTE — Progress Notes
[facility]	Medicine Progress NoteAttending Provider: Loman Chroman * PCP: Chales Abrahams, PiyushInterim History: I have seen and examined the patient. Patients overnight issues reviewed with nursing. Chart reviewed. Examined patient , Reviewed electronic chart for Vitals,Labs,Diagnostics,MAR.Reviewed care notes, discussed with nursing and care coordination team .Reviewed previous notes- summation of old records,reviewed pertinent radiology reports, reviewed pertinent medicine tests .Established problems - see A&P (>4)Subjective: Improving Review of Allergies/Meds/Hx: I have reviewed the patient's: allergies, current scheduled medications, current infusions, current prn medications, past medical history and past surgical historyObjective: Vitals:  01/05/23 0548 BP: (!) 147/76 Pulse: 89 Resp: 18 Temp: 97.6 ?F (36.4 ?C) Weight:   Intake/Output Summary (Last 24 hours) at 01/05/2023 1427Last data filed at 01/05/2023 0620Gross per 24 hour Intake 1423.01 ml Output 2950 ml Net -1526.99 ml  Physical Exam Constitutional: NADMouth/Throat: Oropharynx is clear and moist. Eyes: EOM are normal. Cardiovascular: Normal rate, regular rhythm and normal heart sounds.  No murmur.Pulmonary/Chest: Effort: normal; breath sounds: normal. No wheezing, no rales. Abdominal: Soft. Bowel sounds are normal,nontender,nondistended.Musculoskeletal:no acute inflammation, no tenderness. Neurological:alert and oriented to person, place, and time. Skin:  Left lower extremity cellulitisPsychiatric: normal mood and affect;behavior is normal. Labs:CBC & ZOX:WRUEAV Labs Lab 01/11/240012 01/11/240436 01/12/240632 NA 133* 131* 133* K 3.2* 3.2* 3.2* CL 101 99 100 CO2 19* 18* 20* BUN 40* 39* 38* CREATININE 4.14* 4.09* 4.04* GLU 163* 175* 157* CALCIUM 7.1* 6.9* 7.1* WBC 13.2* 13.2* 12.1* HGB 10.2* 10.1* 9.7* HCT 29.60* 29.60* 28.00* PLT 183 182 251 Last 24 hours: Recent Results (from the past 24 hour(s)) Vancomycin, random     (BH GH LMW YH)  Collection Time: 01/05/23  6:32 AM Result Value Ref Range  Vancomycin Random 13.0 Reference Range Not Established ug/mL Magnesium  Collection Time: 01/05/23  6:32 AM Result Value Ref Range  Magnesium 1.5 (L) 1.8 - 2.5 mg/dL Phosphorus     (BH GH L LMW YH)  Collection Time: 01/05/23  6:32 AM Result Value Ref Range  Phosphorus 2.9 2.5 - 4.9 mg/dL CBC auto differential  Collection Time: 01/05/23  6:32 AM Result Value Ref Range  WBC 12.1 (H) 4.0 - 11.0 x1000/?L  RBC 3.14 (L) 4.00 - 6.00 M/?L  Hemoglobin 9.7 (L) 13.2 - 17.1 g/dL  Hematocrit 40.98 (L) 11.91 - 50.00 %  MCV 89.2 80.0 - 100.0 fL  MCH 30.9 27.0 - 33.0 pg  MCHC 34.6 31.0 - 36.0 g/dL  RDW-CV 47.8 29.5 - 62.1 %  Platelets 251 150 - 420 x1000/?L  MPV 11.4 8.0 - 12.0 fL  Neutrophils 78.9 (H) 39.0 - 72.0 %  Lymphocytes 9.3 (L) 17.0 - 50.0 %  Monocytes 8.4 4.0 - 12.0 %  Eosinophils 1.8 0.0 - 5.0 %  Basophil 0.2 0.0 - 1.4 %  Immature Granulocytes 1.4 (H) 0.0 - 1.0 %  nRBC 0.0 0.0 - 1.0 %  ANC(Abs Neutrophil Count) 9.54 (H) 2.00 - 7.60 x 1000/?L  Absolute Lymphocyte Count 1.13 0.60 - 3.70 x 1000/?L  Monocyte Absolute Count 1.02 (H) 0.00 - 1.00 x 1000/?L  Eosinophil Absolute Count 0.22 0.00 - 1.00 x 1000/?L  Basophil Absolute Count 0.03 0.00 - 1.00 x 1000/?L  Absolute Immature Granulocyte Count 0.17 0.00 - 0.30 x 1000/?L  Absolute nRBC 0.00 0.00 - 1.00 x 1000/?L Basic metabolic panel  Collection Time: 01/05/23  6:32 AM Result Value Ref Range  Sodium 133 (L) 136 - 145 mmol/L  Potassium 3.2 (L) 3.5 - 5.1 mmol/L  Chloride 100 98 - 107 mmol/L  CO2  20 (L) 21 - 32 mmol/L  Anion Gap 13 5 - 16  Glucose 157 (H) 70 - 100 mg/dL  BUN 38 (H) 7 - 18 mg/dL  Creatinine 0.98 (H) 1.19 - 1.30 mg/dL Calcium 7.1 (L) 8.5 - 14.7 mg/dL  BUN/Creatinine Ratio 9.4 8.0 - 23.0  eGFR (Creatinine) 15 (L) >=60 mL/min/1.29m2 XR Chest PA or AP Final Result    Mild fluid overload.  Reported and signed by: Maudie Flakes, MD    [facility] Radiology and Biomedical Imaging   Echo 2D Complete w Doppler and CFI if Ind Image Enhancement 3D and or bubbles Final Result  US Renal Final Result    Unremarkable appearing renal ultrasound  Reported and signed by: Donivan Scull, MD    [facility] Radiology and Biomedical Imaging   US Duplex Lower Extremity Venous Bilat Final Result   No evidence of deep venous thrombosis in bilateral femoral/popliteal veins, within above limitations.  Please note that ultrasound is less sensitive for evaluation of thrombus in calf veins. If there is continued clinical concern, re-evaluation can be performed after 5-7 days to evaluate for propagation of an unseen thrombus from the calf.  [place] Radiology Notify System Classification: Routine.  Report initiated by:  Vincenza Hews, MD  Reported and signed by: Alberteen Sam, MD    [facility] Radiology and Biomedical Imaging   [place] ED Chest Abdomen Pelvis wo IV Contrast Final Result   Evidence of chronic large airways disease/aspiration including endotracheal debris, linear bibasilar scarring, and diffuse bronchial wall thickening.  No evidence of acute abnormality within the chest, abdomen, or pelvis.  [place] Radiology Notify System Classification: Routine.  Report initiated by:  Vincenza Hews, MD  Reported and signed by: Alberteen Sam, MD    [facility] Radiology and Biomedical Imaging   [place] Head Cervical Spine wo IV Contrast Final Result 1. No evidence of acute intracranial abnormality.   2. No evidence for acute cervical spine fracture or traumatic subluxation.  Please note that Noncontrast Head [place] is not sensitive for the detection of ischemic infarct. If ischemic infarct is of clinical concern, additional clinical or imaging evaluation is recommended.   [place] Radiology Notify System Classification: Routine.  Report initiated by:  Vincenza Hews, MD  Reported and signed by: Lavera Guise, MD    [facility] Radiology and Biomedical Imaging   CXR Final Result   No acute cardiothoracic abnormality.   Radiology Notify System Classification: Routine.  Reported and signed by: Alberteen Sam, MD    [facility] Radiology and Biomedical Imaging   Echo 2D Complete w Doppler and CFI if Ind Image Enhancement 3D and or bubblesResult Date: 01/01/2023~ * Very limited study.* Suboptimal visualization of the left ventricle, but it appears to be normal size with normal function.  LVEF estimated by visual assessment was between 55-60%.  Cannot rule out wall motion abnormalities.* Normal right ventricular cavity size and systolic function.* No significant valve abnormalities on this limited study.US RenalResult Date: 1/8/2024RENAL ULTRASOUND WITH DOPPLER HISTORY:  aki. COMPARISON:   Ultrasound 11/13/2021 TECHNIQUE:  Multiple longitudinal and transverse images of the kidneys were performed using a high-resolution linear array transducer. FINDINGS: BLADDER:  The urinary bladder is incompletely distended. The prostate gland is not enlarged.  Bilateral ureteral jets are identified. RIGHT KIDNEY:  Right kidney measures 8.6 cm in length.  The renal echogenicity appears unremarkable. No stones, hydronephrosis or suspicious appearing mass lesions are identified. There are no perinephric collections. LEFT KIDNEY:  Left kidney measures 11.3  cm in length.  The renal echogenicity  appears unremarkable. No stones, hydronephrosis or suspicious appearing mass lesions are identified. There are no perinephric collections.  DOPPLER:    Renal vascular color flow is identified    Unremarkable appearing renal ultrasound Reported and signed by: Donivan Scull, MD  [facility] Radiology and Biomedical Imaging [place] ED Chest Abdomen Pelvis wo IV ContrastResult Date: 1/7/2024CT ED CHEST ABDOMEN PELVIS WO IV CONTRAST HISTORY/INDICATION: Dyspnea status post fall. COMPARISON: NONE TECHNIQUE: [place] images of the chest, abdomen, and pelvis were obtained from the thoracic inlet to the pubic symphysis without intravenous contrast. Coronal and sagittal multiplanar reformatted images of the chest, abdomen and pelvis were provided. FINDINGS: CLAVICLES & SCAPULAE: Unremarkable RIB CAGE & STERNUM: No acute osseous injury. MEDIASTINUM: Unremarkable. LUNGS/AIRWAYS/PLEURA: Evidence of the large airways disease/possible chronic aspiration including mild diffuse bronchial wall thickening, linear bibasilar scarring, and a small amount of endotracheal debris is seen just proximal to the origin of the right mainstem bronchus (image 193 series 4). VASCULATURE: The aorta and its major branches are unremarkable. LYMPH NODES: No enlarged lymph nodes. LIVER & BILIARY: Unremarkable. SPLEEN: Unremarkable. PANCREAS: Unremarkable. ADRENAL GLANDS: Unremarkable. KIDNEYS: Atrophic right kidney.Marland Kitchen Exophytic left renal cyst. BOWEL: Unremarkable. URINARY BLADDER: Unremarkable. PELVIC VISCERA: Unremarkable. VASCULATURE: IVC filter. THORACIC SPINE: No acute osseous injury. LUMBAR SPINE: No acute osseous injury. Chronic appearing grade 1 spondylolisthesis of L5 over S1. Chronic bilateral L5 pars defects. SACRUM & PELVIS: Status post right hip arthroplasty. Severe degenerative changes noted at the left hip. SOFT TISSUES: Bilateral gynecomastia.  Evidence of chronic large airways disease/aspiration including endotracheal debris, linear bibasilar scarring, and diffuse bronchial wall thickening. No evidence of acute abnormality within the chest, abdomen, or pelvis. [place] Radiology Notify System Classification: Routine. Report initiated by:  Vincenza Hews, MD Reported and signed by: Alberteen Sam, MD  [facility] Radiology and Biomedical Imaging US Duplex Lower Extremity Venous BilatResult Date: 1/7/2024US DUPLEX LOWER EXTREMITY VENOUS BILATERAL. HISTORY: Pain and swelling COMPARISON: None. TECHNIQUE: Grayscale, color and pulsed Doppler imaging were performed.  FINDINGS: There is symmetric phasicity in the lower external iliac veins. RIGHT LOWER EXTREMITY: COMMON FEMORAL VEIN: No thrombus. GREAT SAPHENOUS ORIGIN: No thrombus. UPPER PROFUNDA FEMORAL VEIN: No thrombus. FEMORAL VEIN: No thrombus. POPLITEAL VEIN: No thrombus. Appropriate augmentation to flow is noted in the popliteal vein. TIBIOPERONEAL TRUNK: No thrombus. POSTERIOR TIBIAL VEINS: Not well visualized. PERONEAL VEINS: Not well visualized.  LEFT LOWER EXTREMITY: COMMON FEMORAL VEIN: No thrombus. GREAT SAPHENOUS ORIGIN: No thrombus. UPPER PROFUNDA FEMORAL VEIN: No thrombus. FEMORAL VEIN: No thrombus. POPLITEAL VEIN: No thrombus. Appropriate augmentation to flow is noted in the popliteal vein. TIBIOPERONEAL TRUNK: Not well visualized. POSTERIOR TIBIAL VEINS: Not well visualized.   PERONEAL VEINS: Not well visualized.     No evidence of deep venous thrombosis in bilateral femoral/popliteal veins, within above limitations. Please note that ultrasound is less sensitive for evaluation of thrombus in calf veins. If there is continued clinical concern, re-evaluation can be performed after 5-7 days to evaluate for propagation of an unseen thrombus from the calf. [place] Radiology Notify System Classification: Routine. Report initiated by:  Vincenza Hews, MD Reported and signed by: Alberteen Sam, MD  [facility] Radiology and Biomedical Imaging [place] Head Cervical Spine wo IV ContrastResult Date: 1/7/2024CT HEAD CERVICAL SPINE WO IV CONTRAST [facility]) INDICATION: Fall with head strike COMPARISON: None TECHNIQUE: [place] images were obtained from the vertex through T1 without intravenous contrast. Coronal and sagittal multiplanar reformatted images were provided. FINDINGS: Brain: There is no intracranial hemorrhage, edema, mass, mass effect or midline shift. There  is no evidence of acute major vascular distribution infarct. Periventricular white matter hypodensity is nonspecific, though likely related to chronic small vessel ischemia. The ventricles and sulci are symmetrically enlarged, likely related to diffuse parenchymal volume loss.. The basal cisterns are patent. The paranasal sinuses and mastoid air cells are clear. The visualized orbits and osseous structures are unremarkable, noting left cataract surgery. Cervical Spine: There is no compression deformity or evidence for acute fracture or subluxation. The atlanto-occipital and atlanto-axial articulations are intact. Note is made of multilevel degenerative disc disease changes. 1. No evidence of acute intracranial abnormality.  2. No evidence for acute cervical spine fracture or traumatic subluxation. Please note that Noncontrast Head [place] is not sensitive for the detection of ischemic infarct. If ischemic infarct is of clinical concern, additional clinical or imaging evaluation is recommended. [place] Radiology Notify System Classification: Routine. Report initiated by:  Vincenza Hews, MD Reported and signed by: Lavera Guise, MD  [facility] Radiology and Biomedical Imaging CXRResult Date: 1/7/2024XR CHEST PA AND LATERAL INDICATION: fall, weakness COMPARISON: XR CHEST PA OR AP 2022-04-10 FINDINGS:   The cardiomediastinal silhouette is unchanged. Lungs are hypoinflated. No focal consolidation. Mild dependent atelectasis. The pleural spaces are clear. There is no acute osseous injury.  No acute cardiothoracic abnormality.  Radiology Notify System Classification: Routine. Reported and signed by: Alberteen Sam, MD  [facility] Radiology and Biomedical Imaging I have reviewed the patient's ECG as resulted in the EMR.Assessment and Plan: Reviewed care notes, discussed with nursing and care coordination team .Reviewed consult notes .>30 mins in review of chart and exam of pt and preparation of note, discussion w team members, > 50% direct care with patient5 year old male with a past medical histories of obesity hypertension presenting with hypertensive urgency and lower extremity edema and left lower extremity cellulitis as well as acute kidney injury.# left lower extremity cellulitisOn Unasyn vancomycin awaiting culture and sensitivityPain Management# acute kidney injury, hypokalemia, hyponatremia Ultrasound with no obstruction or hydronephrosisSupplement potassium chloride 20 milliequivalents  Continue isotonic fluidsPossible component dehydration urine sodium < 40  Usm mor ethan 400 mOsm  Very low albuminpotassium chloride 40 milliequivalents swan oral will recheck electrolytes in a.m.# hypertensive urgency resolvedContinue amlodipine hydralazine Toprol Crestor# obesity stage III with hypoalbuminemiaNutrition,Will add Nepro t.i.d.DVT prophylaxis in placeNo ACLS There is no immunization history on file for this patient.Signed:have reviewed the consultants recommendations and plan of care. Care was discussed with the nursing and case management teams. The patient was updated regarding the illness, plan of care and disposition plan. All orders were entered electronically. Dinah Beers MD.    [phone] No CodePortions of this document may have been transcribed using speech recognition software. Despite attempts at proofreading this document may contain transcription errors. Please do not hesitate to contact this writer to request clarification.

## 2023-01-05 NOTE — Progress Notes
[facility]	Infectious Disease Progress NoteAttending Provider: Loman Chroman * PCP: Chales Abrahams, PiyushHospital Day: 5Subjective: Interim History:  No acute events overnight.  Reports feeling somewhat better, no fevers, no shortness of breath or cough, no chest pain, no abdominal pain, no diarrheaReview of Allergies/Meds/Hx: Allergies have been reviewed.Current Medications:amLODIPine, 10 mg, Dailyampicillin-sulbactam, 1.5 g, Q8Hferrous sulfate, 325 mg, BID WCfurosemide (LASIX) IV Push OR IVPB, 40 mg, Onceheparin (PORCINE), 5,000 Units, Q8HhydrALAZINE, 25 mg, 3 times dailymetoprolol succinate XL, 25 mg, Dailypolyethylene glycol, 17 g, DailyPotassium chloride SA, 20 mEq, Oncerosuvastatin, 5 mg, Dailysodium chloride, 3 mL, Q8Htiotropium bromide, 2 Inhalation, Daily[START ON 01/06/2023] Vancomycin MAR level, , OnceVancomycin Therapy Placeholder, , placeholderCurrent Infusions:PRN Medications:acetaminophen, albuterol **AND** tiotropium bromide, bisacodyL, ondansetron **OR** ondansetron (ZOFRAN) IV Push, oxyCODONE, sodium chlorideObjective: Vitals:Temp:  [97.6 ?F (36.4 ?C)-98.7 ?F (37.1 ?C)] 98.2 ?F (36.8 ?C)Pulse:  [86-90] 86Resp:  [17-24] 18BP: (145-153)/(65-80) 153/78SpO2:  [93 %-94 %] 94 %SpO2:  [93 %-94 %] 94 % (01/12 1446) Gross Totals (Last 24 hours) at 01/05/2023 1519Last data filed at 01/05/2023 0620Intake 1423.01 ml Output 2950 ml Net -1526.99 ml Lungs clear, heart S1-S2 regular, abdomen soft no tenderness, extremities left lower extremity decreased edema and decreased erythemaLabs:Recent Labs Lab 01/10/240614 01/11/240012 01/11/240436 01/12/240632 NA 133* 133* 131* 133* K 2.9* 3.2* 3.2* 3.2* CL 100 101 99 100 CO2 18* 19* 18* 20* BUN 44* 40* 39* 38* CREATININE 4.35* 4.14* 4.09* 4.04* GLU 125* 163* 175* 157* CALCIUM 7.0* 7.1* 6.9* 7.1* MG 1.8  --  1.5* 1.5* Recent Labs Lab 01/11/240012 01/11/240436 01/12/240632 WBC 13.2* 13.2* 12.1* HGB 10.2* 10.1* 9.7* HCT 29.60* 29.60* 28.00* PLT 183 182 251 No results for input(s): APTT, INR, PTT in the last 168 hours.No results found for: TROPONINIDiagnostics and microbiology reviewedAssessment & Plan: 1. Left lower extremity cellulitis gradually improvingPlan:Strict leg elevation.  Monitor white blood cell count and temperatures.  Continue vancomycin and Unasyn.  From ID standpoint patient can be transitioned to p.o. doxycycline and Augmentin to complete 14 days course of treatment.Signed:Onnie Hatchel, MD Please page using smart web (Hours 8 AM to 4 PM)1/12/20242:16 PM

## 2023-01-05 NOTE — Plan of Care
Problem: Physical Therapy GoalsGoal: Physical Therapy GoalsDescription: Pt will complete bed mobility with modified (I) Pt will complete sit to stand transfers with RW and supervision Pt will ambulate 50 ft with RW and supervision Above goals appear unrealistic at this time, new goals (01/05/23)1. Patient will perform bed mobility with supervision2. Patient will perform transfers with supervision using appropriate assistive device 3. Patient will ambulate a minimum of 15 feet with supervision using appropriate assistive device  Outcome: Revised Plan of Care Overview/ Patient Status    Inpatient Physical Therapy Progress Note IP Adult PT Eval/Treat - 01/05/23 0950    Date of Visit / Treatment  Date of Visit / Treatment 01/05/23   3/4  Note Type Progress Note   Start Time 920   End Time 950   Total Treatment Time 30    General Information  Subjective I am OK.   General Observations Pt resting in bed, on room air, (L) LE on pillow, erythema noted, increased in dependent position, noted pt being soiled with urine, [place] catheter malfunction, changed pt into clean bedding/gown, with PCT after PT.   Precautions/Limitations Fall Precautions    Vital Signs and Orthostatic Vital Signs  Vital Signs Free text BP 135/71, HR 92, SaO2 95% (productive cough with mobility)    Pain/Comfort  Location #1 - PreTreatment Rating (Numbers Scale) 5/10   Pain Location - Side Left   Pain Location leg    Cognition  Level of Consciousness alert   Affect Flat    Peripheral Neurovascular  Edema  leg, left   Edema Edema  3+ (Moderate)    Skin Assessment  Skin Assessment See Nursing Documentation    Posture, Head/Trunk Alignment  Posture, Head/Trunk Alignment Forward head;Rounded shoulders   flexed forward head/posture in sitting on edge of bed   Balance  Sitting Balance: Static  FAIR+     Maintains static position without assist or device, may require Supervision or Verbal Cues (>2 minutes)   Sitting Balance: Dynamic  FAIR      Performs dynamic activities through 75% range Contact Guard or partial range (50-75%) with Supervision   Balance Skills Training Comment Pt declined standing trial.    Bed Mobility  Symptoms Noted During/After Treatment increased pain   Rolling/Turning Right - Independence/Assistance Level Minimum assist;Assist of 1;Verbal cues   Rolling/Turning Left - Independence/Assistance Level Minimum assist;Assist of 1;Verbal cues   Rolling/Turning Assist Device Bed rails   draw pad  Supine-to-Sit Independence/Assistance Level Minimum assist;Assist of 1;Verbal cues   Supine-to-Sit Assist Device Bed rails;Hand held assist;Head of bed elevated   Sit-to-Supine Independence/Assistance Level Minimum assist;Assist of 1;Verbal cues   Sit-to-Supine Assist Device Bed rails   Bed Mobility Comments Pt sat on edge of bed x 15 minutes, worked on weight shifts forward, scooting to edge of bed and to (R) side, seated push ups x 10 reps.    Sit-Stand Transfer Training  Sit-to-Stand Transfer Independence/Assistance Level Refused    Handoff Documentation  Handoff Patient in bed;Patient instructed to call nursing for mobility;Discussed with nursing    Endurance  Endurance Comments impaired    PT- AM-PAC - Basic Mobility Screen- How much help from another person do you currently need.....  Turning from your back to your side while in a flat bed without using rails? 3 - A Little - Requires a little help (supervision, minimal assistance). Can use assistive devices.   Moving from lying on your back to sitting on the side of a flat bed without using bed  rails? 3 - A Little - Requires a little help (supervision, minimal assistance). Can use assistive devices.   Moving to and from a bed to a chair (including a wheelchair)? 1 - Total - Requires total assistance or cannot do it at all.   Standing up from a chair using your arms(e.g., wheelchair or bedside chair)? 1 - Total - Requires total assistance or cannot do it at all.   To walk in a hospital room? 1 - Total - Requires total assistance or cannot do it at all.   Climbing 3-5 steps with a railing? 1 - Total - Requires total assistance or cannot do it at all.   AMPAC Mobility Score 10   TARGET Highest Level of Mobility Mobility Level 4, Transfer to chair   ACTUAL Highest Level of Mobility Mobility Level 3, Sit on edge of bed    Therapeutic Exercise  Therapeutic Exercise Comments supine (B) LE arom ankle pumps, leg rolls, leg lifts x 10 reps, encouraged to perform frequently t/o day.    Clinical Impression  Follow up Assessment Pt demonstrated improvement in bed mobility, sitting balance and overall activity tolerance, declined standing trial today.   Criteria for Skilled Therapeutic Interventions Met yes;treatment indicated    Patient/Family Stated Goals  Patient/Family Stated Goal(s) feel better    Frequency/Equipment Recommendations  PT Frequency 4x per week   MON  What day of week is next treatment expected? Sunday   Equipment Needs During Admission/Treatment Rolling walker    Planned Treatment / Interventions  Plan for Next Visit progress to standing as able    PT Discharge Summary  Physical Therapy Disposition Recommendation Inpatient Rehab   Equipment Recommendations for Discharge To be determined pending progress     Korben Carcione, PT, Lic. # [id number], [employer] # [phone]

## 2023-01-06 LAB — MAGNESIUM: BKR MAGNESIUM: 2 mg/dL — ABNORMAL HIGH (ref 1.8–2.5)

## 2023-01-06 LAB — BASIC METABOLIC PANEL
BKR ANION GAP: 15 (ref 5–16)
BKR BLOOD UREA NITROGEN: 34 mg/dL — ABNORMAL HIGH (ref 7–18)
BKR BUN / CREAT RATIO: 8.5 (ref 8.0–23.0)
BKR CALCIUM: 7 mg/dL — ABNORMAL LOW (ref 8.5–10.5)
BKR CHLORIDE: 100 mmol/L (ref 98–107)
BKR CO2: 20 mmol/L — ABNORMAL LOW (ref 21–32)
BKR CREATININE: 4.02 mg/dL — ABNORMAL HIGH (ref 0.80–1.30)
BKR EGFR, CREATININE (CKD-EPI 2021): 15 mL/min/{1.73_m2} — ABNORMAL LOW (ref >=60)
BKR GLUCOSE: 115 mg/dL — ABNORMAL HIGH (ref 70–100)
BKR POTASSIUM: 3.5 mmol/L (ref 3.5–5.1)
BKR SODIUM: 135 mmol/L — ABNORMAL LOW (ref 136–145)

## 2023-01-06 LAB — VANCOMYCIN, RANDOM     (BH GH LMW YH): BKR VANCOMYCIN RANDOM: 20.8 ug/mL

## 2023-01-06 LAB — BLOOD CULTURE   (BH GH L LMW YH)
BKR BLOOD CULTURE: NO GROWTH
BKR BLOOD CULTURE: NO GROWTH

## 2023-01-06 MED ORDER — POTASSIUM CHLORIDE ER 10 MEQ TABLET,EXTENDED RELEASE(PART/CRYST)
10 MEQ | Freq: Every day | ORAL | Status: DC
Start: 2023-01-06 — End: 2023-01-07
  Administered 2023-01-06 – 2023-01-07 (×2): 10 MEQ via ORAL

## 2023-01-06 MED ORDER — POTASSIUM CHLORIDE 20 MEQ/L IN 0.9 % SODIUM CHLORIDE INTRAVENOUS
20 mEq/L | INTRAVENOUS | Status: DC
Start: 2023-01-06 — End: 2023-01-11
  Administered 2023-01-06 – 2023-01-09 (×4): 20 mL/h via INTRAVENOUS

## 2023-01-06 MED ORDER — DOXYCYCLINE TAB/CAP 100 MG (WRAPPED E-RX)
100 mg | Freq: Two times a day (BID) | ORAL | Status: DC
Start: 2023-01-06 — End: 2023-01-11
  Administered 2023-01-07 – 2023-01-10 (×7): 100 mg via ORAL

## 2023-01-06 MED ORDER — POLYETHYLENE GLYCOL 3350 17 GRAM ORAL POWDER PACKET
17 gram | Freq: Two times a day (BID) | ORAL | Status: DC
Start: 2023-01-06 — End: 2023-01-08
  Administered 2023-01-06 – 2023-01-07 (×2): 17 gram via ORAL

## 2023-01-06 MED ORDER — SENNOSIDES 8.6 MG TABLET
8.6 mg | Freq: Two times a day (BID) | ORAL | Status: DC
Start: 2023-01-06 — End: 2023-01-08
  Administered 2023-01-06 – 2023-01-07 (×2): 8.6 mg via ORAL

## 2023-01-06 MED ORDER — MAGNESIUM SULFATE 2 GRAM/50 ML (4 %) IN WATER INTRAVENOUS PIGGYBACK
2504 gram/50 mL (4 %) | Freq: Once | INTRAVENOUS | Status: CP
Start: 2023-01-06 — End: ?
  Administered 2023-01-06: 14:00:00 2 mL/h via INTRAVENOUS

## 2023-01-06 MED ORDER — POTASSIUM, SODIUM PHOSPHATES 280 MG-160 MG-250 MG ORAL POWDER PACKET
280-160-250 mg | Freq: Before meals | ORAL | Status: DC
Start: 2023-01-06 — End: 2023-01-07
  Administered 2023-01-06 – 2023-01-07 (×4): 280-160-250 mg via ORAL

## 2023-01-06 MED ORDER — LACTULOSE 20 GRAM/30 ML ORAL SOLUTION
2030 gram/30 mL | Freq: Three times a day (TID) | ORAL | Status: DC | PRN
Start: 2023-01-06 — End: 2023-01-07

## 2023-01-06 MED ORDER — POTASSIUM CHLORIDE ER 20 MEQ TABLET,EXTENDED RELEASE(PART/CRYST)
20 MEQ | Freq: Once | ORAL | Status: CP
Start: 2023-01-06 — End: ?
  Administered 2023-01-06: 20:00:00 20 MEQ via ORAL

## 2023-01-06 MED ORDER — BISACODYL 5 MG TABLET,DELAYED RELEASE
5 mg | Freq: Every day | ORAL | Status: DC
Start: 2023-01-06 — End: 2023-01-11
  Administered 2023-01-06 – 2023-01-09 (×2): 5 mg via ORAL

## 2023-01-06 MED ORDER — AMOXICILLIN 875 MG-POTASSIUM CLAVULANATE 125 MG TABLET
875-125 mg | Freq: Two times a day (BID) | ORAL | Status: DC
Start: 2023-01-06 — End: 2023-01-07
  Administered 2023-01-07: 15:00:00 875-125 mg via ORAL

## 2023-01-06 NOTE — Progress Notes
VANCOMYCIN LEVEL EVALUATIONCurrent Vancomycin Order: Last received 1.5g loading dose. Day of Therapy: 7Vancomycin Indication: Suspected MRSA infection; cellulitis Estimated Creatinine Clearance: 18 mL/min (A) (by C-G formula based on SCr of 4.04 mg/dL (H)).Creatinine (mg/dL) Date Value 16/09/9603 4.04 (H) 01/04/2023 4.09 (H) 01/04/2023 4.14 (H)  Renal Function: AKILevel: Lab Results Last 72 Hours Component Value Date/Time  Vancomycin Random 20.8 01/06/2023 06:11 AM  Vancomycin Random 13.0 01/05/2023 06:32 AM  Vancomycin Random 16.5 01/04/2023 04:36 AM Type of Level: random Based on vancomycin level obtained, recommend:   random = 20.8; slightly supra therapeutic. Hold today's dose, recheck random tomorrow (1/14) and dose per level. Repeat Level: random 01/07/23 @0530ID /AST Consulted? YesYNHH/LMH/WH Vancomycin Dosing GuidelineBH Vancomycin Dosing Guideline

## 2023-01-06 NOTE — Progress Notes
[facility]	General Medicine Progress NoteHospital Day # 6Attending Provider: Cindee Salt* Subjective: Interim History:  Routine inpatient follow-up, initially presented with generalized weakness, bilateral lower extremity swollen, left lower extremity redness, diagnosed with a cellulitis, electrolyte derangementThe patient is seen and me, resting comfortably in hospital bed, no acute distress, no acute events overnight.Denies CP, no palpitation, no SOB; afebrile, denies fevers or chills; tolerating diet, no NVD, poor p.o. intake remains; denies acute painReview of Allergies/Meds/Hx: I have reviewed the patient's allergies, prior to admission meds, past medical/surgical, family and social hx. Allergies has No Known Allergies.Past Medical History  has a past medical history of High blood pressure and Hypertension.Past Surgical History  has a past surgical history that includes Tonsillectomy and Total hip arthroplasty (Right, 01/2013).Inpatient Medications:Scheduled Meds:Current Facility-Administered Medications Medication Dose Route Frequency Provider Last Rate Last Admin ? amLODIPine (NORVASC) tablet 10 mg  10 mg Oral Daily Sedaliu, Tomor, MD   10 mg at 01/06/23 0846 ? [START ON 01/07/2023] amoxicillin-clavulanate (AUGMENTIN) 875-125 mg tablet 1 tablet  875 mg Oral 2 times daily Peyton Bottoms, APRN     ? ampicillin-sulbactam (UNASYN) 1.5 g in sodium chloride 0.9% 50 mL (mini-bag plus)  1.5 g Intravenous Q8H Peyton Bottoms, APRN 200 mL/hr at 01/06/23 1513 1.5 g at 01/06/23 1513 ? bisacodyL (DULCOLAX) EC tablet 10 mg  10 mg Oral Daily Peyton Bottoms, APRN   10 mg at 01/06/23 1610 ? [START ON 01/07/2023] doxycycline TAB/CAP 100 mg  100 mg Oral 2 times daily Peyton Bottoms, APRN     ? ferrous sulfate (FEOSOL) tablet 325 mg  325 mg Oral BID WC Giuran-Benetato, Nena Polio, MD   325 mg at 01/06/23 0846 ? heparin (PORCINE) injection 5,000 Units  5,000 Units Subcutaneous Q8H Sedaliu, Tomor, MD   5,000 Units at 01/06/23 1515 ? hydrALAZINE (APRESOLINE) tablet 25 mg  25 mg Oral 3 times daily Sedaliu, Tomor, MD   25 mg at 01/06/23 1515 ? metoprolol succinate XL (TOPROL-XL) 24 hr tablet 25 mg  25 mg Oral Daily Sedaliu, Tomor, MD   25 mg at 01/06/23 0846 ? polyethylene glycol (MIRALAX) packet 17 g  17 g Oral BID Peyton Bottoms, APRN   17 g at 01/06/23 0846 ? potassium and sodium phosphates (ELEMENTAL phosphorus 250 mg/packet) (Phos-NaK) oral powder 250 mg  250 mg Oral AC & HS Peyton Bottoms, APRN     ? Potassium chloride SA (K-DUR,KLOR-CON M) 24 hr tablet 10 mEq  10 mEq Oral Daily Peyton Bottoms, APRN   10 mEq at 01/06/23 0846 ? rosuvastatin (CRESTOR) tablet 5 mg  5 mg Oral Daily Sedaliu, Tomor, MD   5 mg at 01/06/23 0846 ? senna (SENOKOT) tablet 17.2 mg  2 tablet Oral BID Peyton Bottoms, APRN   17.2 mg at 01/06/23 9604 ? sodium chloride 0.9 % flush 3 mL  3 mL IV Push Q8H Sedaliu, Tomor, MD   3 mL at 01/06/23 1515 ? tiotropium bromide (SPIRIVA RESPIMAT) 2.5 mcg/actuation inhalation spray 2 Inhalation  2 Inhalation Inhalation Daily Gearldine Bienenstock, PA   2 Inhalation at 01/06/23 [id number] ? Vancomycin Therapy Placeholder   Intravenous placeholder Peyton Bottoms, APRN     Continuous Infusions:? sodium chloride 0.9% with 20 mEq KCl/L   PRN Meds:.acetaminophen, albuterol **AND** tiotropium bromide, lactulose, ondansetron **OR** ondansetron (ZOFRAN) IV Push, oxyCODONE, sodium chlorideObjective: Vitals:I have reviewed the patient's current vital signs as documented in the patient's EMR.  , Last 24 hours: Temp:  [97.7 ?F (36.5 ?C)] 97.7 ?  F (36.5 ?C)Pulse:  [62-92] 62Resp:  [16-18] 16BP: (138-156)/(67-75) 138/67SpO2:  [92 %-94 %] 94 % and Most recent: Patient Vitals for the past 24 hrs: BP Temp Temp src Pulse Resp SpO2 Height 01/06/23 1451 138/67 97.7 ?F (36.5 ?C) Oral 62 16 94 % -- 01/06/23 1024 -- -- -- -- -- -- 5' 11 (1.803 m) 01/06/23 0554 (!) 154/69 97.7 ?F (36.5 ?C) Oral (!) 92 18 (!) 92 % -- 01/05/23 2035 (!) 156/75 97.7 ?F (36.5 ?C) Oral 84 18 (!) 93 % -- I/O's:I have reviewed the patient's current I&O's as documented in the EMR.No intake/output data recorded.Gross Totals (Last 24 hours) at 01/06/2023 1556Last data filed at 01/06/2023 1451Intake 720 ml Output 3950 ml Net -3230 ml Procedures:None Physical ExamVitals and nursing note reviewed. Constitutional:     Appearance: Normal appearance. He is obese. HENT:    Head: Normocephalic and atraumatic.    Nose: Nose normal.    Mouth/Throat:    Mouth: Mucous membranes are moist. Eyes:    Extraocular Movements: Extraocular movements intact.    Pupils: Pupils are equal, round, and reactive to light. Cardiovascular:    Rate and Rhythm: Normal rate and regular rhythm.    Pulses: Normal pulses.    Heart sounds: Normal heart sounds. Pulmonary:    Effort: Pulmonary effort is normal.    Breath sounds: Normal breath sounds.    Comments: Tolerating room airAbdominal:    Comments: Tolerating diet Genitourinary:   Comments: Condom catheter in place clear yellow urineMusculoskeletal:    Cervical back: Normal range of motion and neck supple.    Comments: Limited ROM due to generalized weakness Skin:   General: Skin is warm and dry.    Capillary Refill: Capillary refill takes less than 2 seconds.    Findings: Erythema present.    Comments: Left lower extremity erythema with a slight improvement Neurological:    General: No focal deficit present.    Mental Status: He is alert and oriented to person, place, and time. Psychiatric:       Mood and Affect: Mood normal.       Behavior: Behavior normal. Labs:Estimated Creatinine Clearance: 18 mL/min (A) (by C-G formula based on SCr of 4.02 mg/dL (H)).Complete Blood CountResults in Past 7 DaysResult Component Current Result Hematocrit 28.00 (L) (01/05/2023) Hemoglobin 9.7 (L) (01/05/2023) MCH 30.9 (01/05/2023) MCHC 34.6 (01/05/2023) MCV 89.2 (01/05/2023) MPV 11.4 (01/05/2023) Platelets 251 (01/05/2023) RBC 3.14 (L) (01/05/2023) WBC 12.1 (H) (01/05/2023)   Chemistry  Lab Results Component Value Date  NA 135 (L) 01/06/2023  K 3.5 01/06/2023  CL 100 01/06/2023  CO2 20 (L) 01/06/2023  BUN 34 (H) 01/06/2023  CREATININE 4.02 (H) 01/06/2023  GLU 115 (H) 01/06/2023  Lab Results Component Value Date  CALCIUM 7.0 (L) 01/06/2023  ALKPHOS 112 01/04/2023  AST 58 (H) 01/04/2023  ALT 50 01/04/2023  BILITOT 0.3 01/04/2023  Results in Past 7 DaysResult Component Current Result Magnesium 2.0 (01/06/2023)  I have reviewed the patient's labs within the last 24 hrs. Significant findings are Leukocytosis 12.1, stable anemia, Na 133, K 3.2, BUN/Cr 38/4.04, magnesium 1.5, calcium 7.1.  Magnesium 2.0Diagnostics:No new radiology.XR Chest PA or APResult Date: 1/10/2024XR CHEST PA OR AP Date: 01/03/2023 4:26 PM INDICATION: mild wheezing r/o fluid overload COMPARISON: XR CHEST PA AND LATERAL 2022-12-31 FINDINGS: Support Devices: None. There is mild fluid overload, central pulmonary vascular prominence and perihilar haziness. No pneumothorax or pleural effusion. Stable cardiomediastinal silhouette.  Mild fluid overload. Reported and signed by: Maudie Flakes, MD  [facility]  Radiology and Biomedical Imaging Echo 2D Complete w Doppler and CFI if Ind Image Enhancement 3D and or bubblesResult Date: 01/01/2023~ * Very limited study.* Suboptimal visualization of the left ventricle, but it appears to be normal size with normal function.  LVEF estimated by visual assessment was between 55-60%.  Cannot rule out wall motion abnormalities.* Normal right ventricular cavity size and systolic function.* No significant valve abnormalities on this limited study.US RenalResult Date: 1/8/2024RENAL ULTRASOUND WITH DOPPLER HISTORY:  aki. COMPARISON:   Ultrasound 11/13/2021 TECHNIQUE:  Multiple longitudinal and transverse images of the kidneys were performed using a high-resolution linear array transducer. FINDINGS: BLADDER:  The urinary bladder is incompletely distended. The prostate gland is not enlarged.  Bilateral ureteral jets are identified. RIGHT KIDNEY:  Right kidney measures 8.6 cm in length.  The renal echogenicity appears unremarkable. No stones, hydronephrosis or suspicious appearing mass lesions are identified. There are no perinephric collections. LEFT KIDNEY:  Left kidney measures 11.3  cm in length.  The renal echogenicity appears unremarkable. No stones, hydronephrosis or suspicious appearing mass lesions are identified. There are no perinephric collections.  DOPPLER:    Renal vascular color flow is identified    Unremarkable appearing renal ultrasound Reported and signed by: Donivan Scull, MD  [facility] Radiology and Biomedical Imaging [place] ED Chest Abdomen Pelvis wo IV ContrastResult Date: 1/7/2024CT ED CHEST ABDOMEN PELVIS WO IV CONTRAST HISTORY/INDICATION: Dyspnea status post fall. COMPARISON: NONE TECHNIQUE: [place] images of the chest, abdomen, and pelvis were obtained from the thoracic inlet to the pubic symphysis without intravenous contrast. Coronal and sagittal multiplanar reformatted images of the chest, abdomen and pelvis were provided. FINDINGS: CLAVICLES & SCAPULAE: Unremarkable RIB CAGE & STERNUM: No acute osseous injury. MEDIASTINUM: Unremarkable. LUNGS/AIRWAYS/PLEURA: Evidence of the large airways disease/possible chronic aspiration including mild diffuse bronchial wall thickening, linear bibasilar scarring, and a small amount of endotracheal debris is seen just proximal to the origin of the right mainstem bronchus (image 193 series 4). VASCULATURE: The aorta and its major branches are unremarkable. LYMPH NODES: No enlarged lymph nodes. LIVER & BILIARY: Unremarkable. SPLEEN: Unremarkable. PANCREAS: Unremarkable. ADRENAL GLANDS: Unremarkable. KIDNEYS: Atrophic right kidney.Marland Kitchen Exophytic left renal cyst. BOWEL: Unremarkable. URINARY BLADDER: Unremarkable. PELVIC VISCERA: Unremarkable. VASCULATURE: IVC filter. THORACIC SPINE: No acute osseous injury. LUMBAR SPINE: No acute osseous injury. Chronic appearing grade 1 spondylolisthesis of L5 over S1. Chronic bilateral L5 pars defects. SACRUM & PELVIS: Status post right hip arthroplasty. Severe degenerative changes noted at the left hip. SOFT TISSUES: Bilateral gynecomastia.  Evidence of chronic large airways disease/aspiration including endotracheal debris, linear bibasilar scarring, and diffuse bronchial wall thickening. No evidence of acute abnormality within the chest, abdomen, or pelvis. [place] [facility] Notify System Classification: Routine. Report initiated by:  Vincenza Hews, MD Reported and signed by: Alberteen Sam, MD  [place] [facility]ical Imaging US Duplex Lower Extremity Venous BilatResult Date: 1/7/2024US DUPLEX LOWER EXTREMITY VENOUS BILATERAL. HISTORY: Pain and swelling COMPARISON: None. TECHNIQUE: [place], color and pulsed Doppler imaging were performed.  FINDINGS: There is symmetric phasicity in the lower external iliac veins. RIGHT LOWER EXTREMITY: COMMON FEMORAL VEIN: No thrombus. GREAT SAPHENOUS ORIGIN: No thrombus. UPPER PROFUNDA FEMORAL VEIN: No thrombus. FEMORAL VEIN: No thrombus. POPLITEAL VEIN: No thrombus. Appropriate augmentation to flow is noted in the popliteal vein. TIBIOPERONEAL TRUNK: No thrombus. POSTERIOR TIBIAL VEINS: Not well visualized. PERONEAL VEINS: Not well visualized.  LEFT LOWER EXTREMITY: COMMON FEMORAL VEIN: No thrombus. GREAT SAPHENOUS ORIGIN: No thrombus. UPPER PROFUNDA FEMORAL VEIN: No thrombus. FEMORAL VEIN: No thrombus. POPLITEAL VEIN:  No thrombus. Appropriate augmentation to flow is noted in the popliteal vein. TIBIOPERONEAL TRUNK: Not well visualized. POSTERIOR TIBIAL VEINS: Not well visualized.   PERONEAL VEINS: Not well visualized.     No evidence of deep venous thrombosis in bilateral femoral/popliteal veins, within above limitations. Please note that ultrasound is less sensitive for evaluation of thrombus in calf veins. If there is continued clinical concern, re-evaluation can be performed after 5-7 days to evaluate for propagation of an unseen thrombus from the calf. [place] Rad[facility] Notify System Classification: Routine. Report initiated by:  Vincenza Hews, MD Reported and signed by: Alberteen Sam, MD  [facility] Radiology and Biomedical Imaging [place] Cervical Spine wo IV ContrastResult Date: 1/7/2024CT HEAD CERVICAL SPINE WO IV CONTRAST [facility]) INDICATION: Fall with head strike COMPARISON: None TECHNIQUE: [place] images were obtained from the vertex through T1 without intravenous contrast. Coronal and sagittal multiplanar reformatted images were provided. FINDINGS: Brain: There is no intracranial hemorrhage, edema, mass, mass effect or midline shift. There is no evidence of acute major vascular distribution infarct. Periventricular white matter hypodensity is nonspecific, though likely related to chronic small vessel ischemia. The ventricles and sulci are symmetrically enlarged, likely related to diffuse parenchymal volume loss.. The basal cisterns are patent. The paranasal sinuses and mastoid air cells are clear. The visualized orbits and osseous structures are unremarkable, noting left cataract surgery. Cervical Spine: There is no compression deformity or evidence for acute fracture or subluxation. The atlanto-occipital and atlanto-axial articulations are intact. Note is made of multilevel degenerative disc disease changes. 1. No evidence of acute intracranial abnormality.  2. No evidence for acute cervical spine fracture or traumatic subluxation. Please note that Noncontrast [place] is not sensitive for the detection of ischemic infarct. If ischemic infarct is of clinical concern, additional clinical or imaging evaluation is recommended. [place] Rad[facility] Notify System Classification: Routine. Report initiated by:  Vincenza Hews, MD Reported and signed by: Lavera Guise, MD  [facility] Radiology and Biomedical Imaging CXRResult Date: 1/7/2024XR CHEST PA AND LATERAL INDICATION: fall, weakness COMPARISON: XR CHEST PA OR AP 2022-04-10 FINDINGS:   The cardiomediastinal silhouette is unchanged. Lungs are hypoinflated. No focal consolidation. Mild dependent atelectasis. The pleural spaces are clear. There is no acute osseous injury.  No acute cardiothoracic abnormality. [place] [facility] Notify System Classification: Routine. Reported and signed by: Alberteen Sam, MD  [facility] Radiology and Biomedical Imaging ECG/Tele Events: No ECG ordered todayI have reviewed the patient's ECG as resulted in the EMR.Assessment: 72 y.o. M with a PMH significant of HTN, HLD, CKD, presented to [facility] ED on 01/07 c/o generalized weakness calm bilateral lower extremity swollen, Initial workup with Na 129, seCr 4.25,The patient was initially diagnosed with a cellulitis, AKI, electrolyte derangementOn exam, resting comfortably in hospital bed, no acute distress, denies acute painAKI slow to improve, a.m. labs pending, nephrology on board continue to followLE cellulitis slow improve, appreciate id consultPrincipal Problem:  Cellulitis  SNOMED [place](R): CELLULITIS  Active Problems:  AKI (acute kidney injury) (HC Code)  SNOMED [place](R): ACUTE INJURY OF KIDNEY    Hyponatremia  SNOMED [place](R): HYPONATREMIA    Hypomagnesemia  SNOMED [place](R): HYPOMAGNESEMIA    Hypokalemia  SNOMED [place](R): HYPOKALEMIA    Generalized weakness  SNOMED (R): ASTHENIA  Plan: Treatment plan with no significant changes from previous day1.  Left lower extremity cellulitis- with the improvement- continue vancomycin+ Unasyn for 1 more day 7/7- appreciate id consult, we will transition to p.o. doxycycline + Augmentin the following day- follow fevers chills episode, follow daily  CBC# AKI- to poor p.o. intake- initial BUN/Cr 4.25/55, a.m. creatinine remains > for- renal function slow to improve- renal ultrasound with no acute finding- appreciate nephrology consult- will restart IVF # electrolyte derangement, hyponatremia, hypokalemia, hypomagnesemia, hypophosphatemia- likely due to poor p.o. intake- continue IVF- monitor electrolytes daily closely# acute hypertension- the patient has initially presented with acute hypotension- BP improving- continue amlodipine- continue hydralazine, continue metoprololHLD- continue CrestorGeneralized weakness/lives home alone- PT consultDisposition- not ready for dischargeThe patient is given update for careI have reviewed the consultants recommendations and/or discussed the plan of care with them directly. Care was discussed with the nursing and case managment teams. Patient/family was updated regarding illness, plan of care, and disposition plan. All orders were entered electronically. ?>40 minutes of time spent on chart review, counseling and/or coordinating care>50% of time spent on counseling/ direct care with patientSigned:Duff Pozzi Dion Body, APRN Beeper: [phone] or [email or web address]/13/20248:23 AM

## 2023-01-06 NOTE — Plan of Care
Problem: Adult Inpatient Plan of CareGoal: Plan of Care ReviewOutcome: Interventions implemented as appropriate Problem: Adult Inpatient Plan of CareGoal: Patient-Specific Goal (Individualized)Outcome: Interventions implemented as appropriate Problem: Adult Inpatient Plan of CareGoal: Absence of Hospital-Acquired Illness or InjuryOutcome: Interventions implemented as appropriate Problem: Adult Inpatient Plan of CareGoal: Optimal Comfort and WellbeingOutcome: Interventions implemented as appropriate Problem: Adult Inpatient Plan of CareGoal: Readiness for Transition of CareOutcome: Interventions implemented as appropriate Problem: Fall Injury RiskGoal: Absence of Fall and Fall-Related InjuryOutcome: Interventions implemented as appropriate Plan of Care Overview/ Patient Status    Assumed care of patient at 1900, alert and oriented. Denies pain or discomfort. ABX  Ampicillin-sulbactam 1.5 g continues, no adverse reaction noted. External catheter in place draining yellow urine. No acute respiratory distress noted. Medication administered per MAR. Slept well during the night. Frequent rounding done, able to make needs known. Call light within reach at all times.

## 2023-01-06 NOTE — Progress Notes
[facility]	Nephrology Progress NoteAttending Provider: Cindee Salt* Assessment/Plan: Assessment  Marc Evans is a 72 year old male with a medical history of CKD 4, hypertension, hyperlipidemia, BPH with outflow obstruction, hx of bilateral nephrosis with bilateral nephrostomy tubes, s/p HOLEP surgery (05/18/21), history of UTIs with foley catheters, history of epididymoorchitis.  He came in for evaluation of weakness, not eating (but drinking) for three days.  Found to have AKI on CKD.  Left leg cellulitis.  Blood cultures 12/31/22 no growth to date.  ID consulting.?Echo 01/01/23 showed EF 55-60%; limited study; normal LV function and no valvular abnormalities.  Repeat CXR 01/03/23 showed mild fluid overload.AKI on CKD, non oliguricBaseline serum creatinine last November thought to be 2.5-3.0.  Etiology of CKD was thought to be related to obstructive nephropathy in 2021, relieved by bilateraly nephrostomy tubes, followed by HOLEP surgery in 05/18/21.  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  UAs 2021-2022 had moderate proteinuria.?Etiology of AKI possibly ATN iso infection vs prerenal/CRS.  [place] AP without iv contrast 12/31/22 shows no obstructive nephropathy.  UA 12/31/22 had 1+ protein, 1+ ketones, 2+ blood, few bacteria.  FeUrea 12/31/22 was 48.5% suggestive of intrinsic renal disease.  UPCR 11/27/22 had 0.940 g/mgCr.  UA 11/27/22 bland except for 1+ protein.?No new labs today, 01/06/23.  His sCr was 4.04-4.14 on 1/11-1/12, stable.  BUN stable.    Urine output 3.85 L in last 24 hours.  He is on Unasyn IVP and vancomycin IVP.  His vancomycin random level was 20.8 on 01/06/23.?Recommendations/Plan:-Continue to hold losartan-Would hold lasix for now-BMP daily-Strict I&O; measure urine output-Bladder scan q shift-Avoid nephrotoxins, including NSAIDS and iv contrast dye, if ableHypokalemiaLikely due to poor oral intake.  Serum potassium 3.2 on 1/11-1/12.  KCL 40 mEq given on 1/12.-Continue KCL 40 mEq daily-BMP daily  Hypocalcemia?Serum calcium 7.1 on 01/05/23 and albumin 1.7 on 01/04/23.  Corrected calcium 8.9 mg/dL.-Encourage po intake of food-BMP dailyHyponatremiaSerum sodium 133 on 01/05/23.  Worsening likely due to hypervolemia which should improve with diuresis.-BMP daily-Continue protein supplementsHypomagnesemiaSerum Mg 1.5 on 1/11-1/12. Mg oxide 800 mg po given on 01/04/23.  Mg sulfate IVP 1g given on 01/06/23.-Repeat Mg lab dailyAnemia of Chronic Renal DiseaseHgb 9.7 on 01/05/23.  Normocytic anemia.  Iron sat 17 on 01/03/23.-Would hold off IV iron due to infection-Continue po iron daily-CBC daily  Subjective: Subjective  Interim History:O/N no acute events.  Seen lying in bed.  C/o not much appetite.  Not eating much.Denies headache, dizziness, nausea.  SBP 140-160s.  O2 sat 92-94% on room air.No new labs today.  Labs ordered for tomorrow (01/07/23) am.Review of Allergies/Meds/Hx: I have reviewed the patient's: allergies and current scheduled medications Current Facility-Administered Medications Medication Dose Route Frequency Provider Last Rate Last Admin ? amLODIPine (NORVASC) tablet 10 mg  10 mg Oral Daily Sedaliu, Tomor, MD   10 mg at 01/06/23 0846 ? ampicillin-sulbactam (UNASYN) 1.5 g in sodium chloride 0.9% 50 mL (mini-bag plus)  1.5 g Intravenous Q8H Sedaliu, Tomor, MD 200 mL/hr at 01/06/23 0550 1.5 g at 01/06/23 0550 ? bisacodyL (DULCOLAX) EC tablet 10 mg  10 mg Oral Daily Peyton Bottoms, APRN   10 mg at 01/06/23 3474 ? ferrous sulfate (FEOSOL) tablet 325 mg  325 mg Oral BID WC Giuran-Benetato, Nena Polio, MD   325 mg at 01/06/23 0846 ? heparin (PORCINE) injection 5,000 Units  5,000 Units Subcutaneous Q8H Sedaliu, Tomor, MD   5,000 Units at 01/06/23  1610 ? hydrALAZINE (APRESOLINE) tablet 25 mg  25 mg Oral 3 times daily Sedaliu, Tomor, MD   25 mg at 01/06/23 0846 ? metoprolol succinate XL (TOPROL-XL) 24 hr tablet 25 mg  25 mg Oral Daily Sedaliu, Tomor, MD   25 mg at 01/06/23 0846 ? polyethylene glycol (MIRALAX) packet 17 g  17 g Oral BID Peyton Bottoms, APRN   17 g at 01/06/23 0846 ? Potassium chloride SA (K-DUR,KLOR-CON M) 24 hr tablet 10 mEq  10 mEq Oral Daily Peyton Bottoms, APRN   10 mEq at 01/06/23 0846 ? rosuvastatin (CRESTOR) tablet 5 mg  5 mg Oral Daily Sedaliu, Tomor, MD   5 mg at 01/06/23 0846 ? senna (SENOKOT) tablet 17.2 mg  2 tablet Oral BID Peyton Bottoms, APRN   17.2 mg at 01/06/23 9604 ? sodium chloride 0.9 % flush 3 mL  3 mL IV Push Q8H Sedaliu, Tomor, MD   3 mL at 01/06/23 0553 ? tiotropium bromide (SPIRIVA RESPIMAT) 2.5 mcg/actuation inhalation spray 2 Inhalation  2 Inhalation Inhalation Daily Gearldine Bienenstock, [place]   2 Inhalation at 01/06/23 [phone] ? Vancomycin Therapy Placeholder   Intravenous placeholder Sedaliu, Tomor, MD     acetaminophen, albuterol **AND** tiotropium bromide, lactulose, ondansetron **OR** ondansetron (ZOFRAN) IV Push, oxyCODONE, sodium chloride  Objective  Objective: Vitals:I have reviewed the current vital signs Patient Vitals for the past 24 hrs: BP Temp Temp src Pulse Resp SpO2 Height 01/06/23 1024 -- -- -- -- -- -- 5' 11 (1.803 m) 01/06/23 0554 (!) 154/69 97.7 ?F (36.5 ?C) Oral (!) 92 18 (!) 92 % -- 01/05/23 2035 (!) 156/75 97.7 ?F (36.5 ?C) Oral 84 18 (!) 93 % -- 01/05/23 1446 (!) 153/78 98.2 ?F (36.8 ?C) Oral 86 18 94 % -- I/O's:Gross Totals (Last 24 hours) at 01/06/2023 1218Last data filed at 01/06/2023 0600Intake 480 ml Output 3850 ml Net -3370 ml Physical Exam Constitutional: Calm, NAD, AAOX3, forgetful, speaking full sentences. Eyes  : Not pale, anicteric scleraeENT : moist oral mucosaNeck: No JVD notedCardiovascular: RRR S1 S2, no m/r/gChest: CTAB, diminished at bases.  No rales.  Increased work of breathing.  Is on room air.Abdominal: Large, BS hypo, soft, nt GU : [place] cathExtremities:  Bilateral LE edema +3, L>R, left leg red with cellulitic changesSkin : No rash on limited examNeurological:  Alert and oriented to person, place, and time. Psychiatric: Affect somewhat flatLabs:I have reviewed the current lab results Recent Labs Lab 01/09/240513 01/10/240614 01/11/240012 01/11/240436 01/12/240632 NA 131* 133* 133* 131* 133* K 3.0* 2.9* 3.2* 3.2* 3.2* CL 98 100 101 99 100 CO2 20* 18* 19* 18* 20* BUN 52* 44* 40* 39* 38* CREATININE 4.30* 4.35* 4.14* 4.09* 4.04* GLU 142* 125* 163* 175* 157* CALCIUM 6.6* 7.0* 7.1* 6.9* 7.1* MG 1.7* 1.8  --  1.5* 1.5* PHOS 3.8 3.9  --  3.2 2.9 Recent Labs Lab 01/10/240614 01/11/240012 01/11/240436 01/12/240632 WBC 9.6 13.2* 13.2* 12.1* HGB 9.5* 10.2* 10.1* 9.7* HCT 27.60* 29.60* 29.60* 28.00* PLT 115* 183 182 251 Recent Labs Lab 01/08/241232 01/09/240513 01/10/240614 01/11/240012 ALKPHOS 70 80 89 112 BILITOT 0.6 0.5 0.4 0.3 BILIDIR 0.3 0.3 0.2  --  PROT 6.4 5.7* 5.7* 6.5 ALT 33 48 45 50 AST 41 69* 47* 58*  Recent Labs Lab 01/07/241336 PROTEINUA 2+* UROBILINOGEN <2.0    Signed: Claude Manges, APRN PACT Kidney CenterOffice: [phone] 13, 20249:07 AM

## 2023-01-06 NOTE — Plan of Care
Plan of Care Overview/ Patient Status    Marc Evans Age				Location: [facility]/[id number] y.o., male				Attending: Cindee Salt*	Admit Date: 12/31/2022			[id number] LOS: 6 days Past Medical History: Diagnosis Date ? High blood pressure  ? Hypertension  Past Surgical History: Procedure Laterality Date ? TONSILLECTOMY    as a child ? 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  Medication:SCHEDULED: Current Facility-Administered Medications Medication Dose Route Frequency Provider Last Rate Last Admin ? amLODIPine (NORVASC) tablet 10 mg  10 mg Oral Daily Sedaliu, Tomor, MD   10 mg at 01/06/23 0846 ? ampicillin-sulbactam (UNASYN) 1.5 g in sodium chloride 0.9% 50 mL (mini-bag plus)  1.5 g Intravenous Q8H Sedaliu, Tomor, MD 200 mL/hr at 01/06/23 0550 1.5 g at 01/06/23 0550 ? bisacodyL (DULCOLAX) EC tablet 10 mg  10 mg Oral Daily Peyton Bottoms, APRN   10 mg at 01/06/23 4098 ? ferrous sulfate (FEOSOL) tablet 325 mg  325 mg Oral BID WC Giuran-Benetato, Nena Polio, MD   325 mg at 01/06/23 0846 ? heparin (PORCINE) injection 5,000 Units  5,000 Units Subcutaneous Q8H Sedaliu, Tomor, MD   5,000 Units at 01/06/23 0551 ? hydrALAZINE (APRESOLINE) tablet 25 mg  25 mg Oral 3 times daily Sedaliu, Tomor, MD   25 mg at 01/06/23 0846 ? metoprolol succinate XL (TOPROL-XL) 24 hr tablet 25 mg  25 mg Oral Daily Sedaliu, Tomor, MD   25 mg at 01/06/23 0846 ? polyethylene glycol (MIRALAX) packet 17 g  17 g Oral BID Peyton Bottoms, APRN   17 g at 01/06/23 0846 ? Potassium chloride SA (K-DUR,KLOR-CON M) 24 hr tablet 10 mEq  10 mEq Oral Daily Peyton Bottoms, APRN   10 mEq at 01/06/23 0846 ? rosuvastatin (CRESTOR) tablet 5 mg  5 mg Oral Daily Sedaliu, Tomor, MD   5 mg at 01/06/23 0846 ? senna (SENOKOT) tablet 17.2 mg  2 tablet Oral BID Peyton Bottoms, APRN   17.2 mg at 01/06/23 1191 ? sodium chloride 0.9 % flush 3 mL  3 mL IV Push Q8H Sedaliu, Tomor, MD   3 mL at 01/06/23 0553 ? tiotropium bromide (SPIRIVA RESPIMAT) 2.5 mcg/actuation inhalation spray 2 Inhalation  2 Inhalation Inhalation Daily Gearldine Bienenstock, [place]   2 Inhalation at 01/06/23 [phone] ? Vancomycin Therapy Placeholder   Intravenous placeholder Sedaliu, Tomor, MD     Nutritional AssessmentTimepoint: AssessmentReason For Assessment: RD screen Patient Reported Diet/Restrictions/Preferences: regularCurrent StateCurrent Appetite: goodSpecialty Diet/Nutrition Received: renal dietDiet/Feeding Assistance: tray set-upDiet/Feeding Tolerance: goodNutrition Risk Screen: no indicators presentAnthropometric MeasurementsHeight: 5' 11 (180.3 cm) Weight: 112.6 kg Weight Method: Actual Weight Scale Used: Bed  BMI (Calculated): 34.6IBW/kg (Calculated) : 75.3 Usual weight: 230#/104.55kg per patientAdmit Weight: 112.6 kgWeight Change: +3/+4 b/l LE edema  Nutrition Focused Physical FindingsOverall Physical Appearance: obese, edematous (+3/+4 edema b/l LE)Skin: other (see comments) (no PI)Estimated Nutritional NeedsTotal Energy Estimated Needs: 1573-2202gmMethod for Estimating Needs: MSJ x 1.0-1.4Total Protein Estimated Needs: 78-94gmMethod for Estimating Needs: 1-1.2gm/kg 78.18kgTotal Fluid Estimated Needs: 1573-2270mlMethod for Estimating Needs: 66ml/kcalNutrition OrderNutrition Order: meets nutritional requirementsNutrition RiskDate of Last RD visit: 01/13/24Date of next RD visit: 01/20/24Nutrition Risk: patient at nutritional riskLevel of Risk: moderateNutrition DiagnosisNutrition Diagnosis/Problem: Altered nutrition related lab valuesRelated to: acute illnessAs Evidenced by: Na 133, K 3.2, Mg 1.5Nutrition Recommendations  Assessment/Plan71 yo M admitted with cellulitis and AKI seen for nutrition assessment for length of stay.  History of HTN, HLD and CKD.  Per H&P patient with severe weakness x 3 days and decreased PO intake.  Today patient reports a good appetite and ate 75% of breakfast.  Patient agreeable  to supplements.  Na 133, K 3.2, Mg 1.5.  +3/+4 edema b/l LE. KCl tablets ordered daily.  Education:Diet Type and Issues Discussed: supplements, adequate nutrition for healingEducation Type: discussedGiven to: patientReadiness to Learn: CooperativeBarriers: noneResponse to Education: verbalizes understandingAnticipated Compliance: Very Good Recommendations/Interventions:?	Consider Cardiac Diet with Ensure HP daily.  ?	Encourage PO, provide food preferences, assist PO as needed?	Request weekly weightsGoals: 1- Patient will consume and tolerate >75% of meals and supplements.2- Fluids, electrolytes WNL.?	3- Preserve lean body mass and skin integrity. Monitoring/Evaluation:Will monitor 	Weight and weight change	Diet Order and Food Intake	Glucose profile/Labs	Gastrointestinal	Renato Battles MPH, RD, CNSC, CD-N1/13/2024 10:32 AM

## 2023-01-07 LAB — CBC WITH AUTO DIFFERENTIAL
BKR WAM ABSOLUTE IMMATURE GRANULOCYTES.: 0.18 x 1000/ÂµL (ref 0.00–0.30)
BKR WAM ABSOLUTE LYMPHOCYTE COUNT.: 0.97 x 1000/??L (ref 0.60–3.70)
BKR WAM ABSOLUTE NRBC (2 DEC): 0 x 1000/ÂµL (ref 0.00–1.00)
BKR WAM ANALYZER ANC: 7.35 x 1000/??L (ref 2.00–7.60)
BKR WAM BASOPHIL ABSOLUTE COUNT.: 0.03 x 1000/ÂµL (ref 0.00–1.00)
BKR WAM BASOPHILS: 0.3 % (ref 0.0–1.4)
BKR WAM EOSINOPHILS: 1.4 % (ref 0.0–5.0)
BKR WAM HEMATOCRIT (2 DEC): 28.6 % — ABNORMAL LOW (ref 38.50–50.00)
BKR WAM HEMOGLOBIN: 9.7 g/dL — ABNORMAL LOW (ref 13.2–17.1)
BKR WAM IMMATURE GRANULOCYTES: 1.9 % — ABNORMAL HIGH (ref 0.0–1.0)
BKR WAM LYMPHOCYTES: 10.3 % — ABNORMAL LOW (ref 17.0–50.0)
BKR WAM MCH (PG): 30.6 pg (ref 27.0–33.0)
BKR WAM MCHC: 33.9 g/dL (ref 31.0–36.0)
BKR WAM MCV: 90.2 fL (ref 80.0–100.0)
BKR WAM MONOCYTE ABSOLUTE COUNT.: 0.77 x 1000/ÂµL (ref 0.00–1.00)
BKR WAM MONOCYTES: 8.2 % (ref 4.0–12.0)
BKR WAM MPV: 10.8 fL (ref 8.0–12.0)
BKR WAM NEUTROPHILS: 77.9 % — ABNORMAL HIGH (ref 39.0–72.0)
BKR WAM NUCLEATED RED BLOOD CELLS: 0 % (ref 0.0–1.0)
BKR WAM PLATELETS: 303 x1000/??L (ref 150–420)
BKR WAM RDW-CV: 12.3 % (ref 11.0–15.0)
BKR WAM RED BLOOD CELL COUNT.: 3.17 M/ÂµL — ABNORMAL LOW (ref 4.00–6.00)
BKR WAM WHITE BLOOD CELL COUNT: 9.4 x1000/ÂµL (ref 4.0–11.0)

## 2023-01-07 LAB — BASIC METABOLIC PANEL
BKR ANION GAP: 13 (ref 5–16)
BKR BLOOD UREA NITROGEN: 38 mg/dL — ABNORMAL HIGH (ref 7–18)
BKR BUN / CREAT RATIO: 10.6 (ref 8.0–23.0)
BKR CALCIUM: 7 mg/dL — ABNORMAL LOW (ref 8.5–10.5)
BKR CHLORIDE: 101 mmol/L (ref 98–107)
BKR CO2: 19 mmol/L — ABNORMAL LOW (ref 21–32)
BKR CREATININE: 3.59 mg/dL — ABNORMAL HIGH (ref 0.80–1.30)
BKR EGFR, CREATININE (CKD-EPI 2021): 18 mL/min/{1.73_m2} — ABNORMAL LOW (ref >=60)
BKR GLUCOSE: 116 mg/dL — ABNORMAL HIGH (ref 70–100)
BKR POTASSIUM: 4 mmol/L (ref 3.5–5.1)
BKR SODIUM: 133 mmol/L — ABNORMAL LOW (ref 136–145)

## 2023-01-07 LAB — PHOSPHORUS     (BH GH L LMW YH): BKR PHOSPHORUS: 3.5 mg/dL (ref 2.5–4.9)

## 2023-01-07 LAB — ALBUMIN: BKR ALBUMIN: 1.4 g/dL — ABNORMAL LOW (ref 3.4–5.0)

## 2023-01-07 LAB — VANCOMYCIN, RANDOM     (BH GH LMW YH): BKR VANCOMYCIN RANDOM: 14.9 ug/mL (ref 0.00–1.00)

## 2023-01-07 LAB — MAGNESIUM: BKR MAGNESIUM: 1.9 mg/dL (ref 1.8–2.5)

## 2023-01-07 MED ORDER — DEXTROMETHORPHAN-GUAIFENESIN 10 MG-100 MG/5 ML ORAL SYRUP
10-1005 mg/5 mL | ORAL | Status: DC | PRN
Start: 2023-01-07 — End: 2023-01-11
  Administered 2023-01-08 – 2023-01-10 (×7): 10-100 mL via ORAL

## 2023-01-07 MED ORDER — HYDRALAZINE 50 MG TABLET
50 mg | Freq: Three times a day (TID) | ORAL | Status: DC
Start: 2023-01-07 — End: 2023-01-11
  Administered 2023-01-08 – 2023-01-10 (×9): 50 mg via ORAL

## 2023-01-07 MED ORDER — AMOXICILLIN 500 MG-POTASSIUM CLAVULANATE 125 MG TABLET
500-125 mg | Freq: Two times a day (BID) | ORAL | Status: DC
Start: 2023-01-07 — End: 2023-01-11
  Administered 2023-01-08 – 2023-01-10 (×6): 500-125 mg via ORAL

## 2023-01-07 NOTE — Progress Notes
[facility]	Nephrology Progress NoteAttending Provider: Cindee Salt* Assessment/Plan: Assessment  Marc Evans is a 72 year old male with a medical history of CKD 4, hypertension, hyperlipidemia, BPH with outflow obstruction, hx of bilateral nephrosis with bilateral nephrostomy tubes, s/p HOLEP surgery (05/18/21), history of UTIs with foley catheters, history of epididymoorchitis.  He came in for evaluation of weakness, not eating (but drinking) for three days.  Found to have AKI on CKD.  Left leg cellulitis.  Blood cultures 12/31/22 no growth to date.  ID consulting.?Echo 01/01/23 showed EF 55-60%; limited study; normal LV function and no valvular abnormalities.  Repeat CXR 01/03/23 showed mild fluid overload.AKI on CKD, non oliguricBaseline serum creatinine last November thought to be 2.5-3.0.  Etiology of CKD was thought to be related to obstructive nephropathy in 2021, relieved by bilateraly nephrostomy tubes, followed by HOLEP surgery in 05/18/21.  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  UAs 2021-2022 had moderate proteinuria.?Etiology of AKI possibly ATN iso infection vs prerenal/CRS.  [place] AP without iv contrast 12/31/22 shows no obstructive nephropathy.  UA 12/31/22 had 1+ protein, 1+ ketones, 2+ blood, few bacteria.  FeUrea 12/31/22 was 48.5% suggestive of intrinsic renal disease.  UPCR 11/27/22 had 0.940 g/mgCr.  UA 11/27/22 bland except for 1+ protein.?His sCr was 3.59 on 1/14, improved with IVF fluid and substantial urine production.  BUN stable.    Urine output 2.4 L in last 24 hours.  He is on Unasyn IVP and vancomycin IVP.  His vancomycin random level was 14.9 on 01/07/23.?Recommendations/Plan:-Agree with continuing IVF NS (with 20 mEq KCL) at 75 mL/hr; low threshold to stop IVF with SOB increases or other signs of fluid overload-Continue to hold losartan-Would hold lasix for now; still making substantial urine-BMP daily-Strict I&O; measure urine output-Avoid nephrotoxins, including NSAIDS and iv contrast dye, if able-He has a hospital follow-up appointment in outpatient nephrology with Dr. Larna Daughters on 02/26/23 at 11:30 am in [facility].  Labs will need to be done prior to appt.HypokalemiaCorrected.  Likely due to poor oral intake.-Would continue KCL 40 mEq daily or OK to give IV-BMP daily  Hypocalcemia?Serum calcium 7.0 on 01/07/23 and albumin 1.4 on 01/07/23.  Corrected calcium 9.1 mg/dL, within normal range.  Not eating very much at all.  Albumin trending down.-Encourage po intake of food-BMP dailyHyponatremiaMild.  Serum sodium 133 on 01/07/23.-BMP daily; monitor-Continue protein supplementsHypomagnesemiaCorrected.  Serum Mg 1.9 on 1/14.-Would give Mg Ox 400 mg po daily-Repeat Mg lab dailyAnemia of Chronic Renal DiseaseHgb 9.7 on 1/12-1/14/24.  Normocytic anemia.  Iron sat 17 on 01/03/23.-Would hold off IV iron due to infection-Continue po iron daily-CBC daily  Subjective: Subjective  Interim History:O/N no acute events.  Seen this am, sleeping.  Awoke to voice.  No specific complaints except lack of appetite.  Denies headache, nausea.  SBP 130-160s.  O2 sat 91-94% on room air.Review of Allergies/Meds/Hx: I have reviewed the patient's: allergies and current scheduled medications Current Facility-Administered Medications Medication Dose Route Frequency Provider Last Rate Last Admin ? amLODIPine (NORVASC) tablet 10 mg  10 mg Oral Daily Sedaliu, Tomor, MD   10 mg at 01/07/23 0933 ? amoxicillin-clavulanate (AUGMENTIN) 500-125 mg tablet 500 mg  500 mg Oral 2 times daily Cindee Salt, MD     ? bisacodyL (DULCOLAX) EC tablet 10 mg  10 mg Oral Daily Peyton Bottoms, APRN   10 mg at 01/06/23 4098 ? doxycycline TAB/CAP 100 mg  100 mg  Oral 2 times daily Peyton Bottoms, APRN   100 mg at 01/07/23 0981 ? ferrous sulfate (FEOSOL) tablet 325 mg  325 mg Oral BID WC Giuran-Benetato, Nena Polio, MD   325 mg at 01/07/23 0933 ? heparin (PORCINE) injection 5,000 Units  5,000 Units Subcutaneous Q8H Sedaliu, Tomor, MD   5,000 Units at 01/07/23 1914 ? hydrALAZINE (APRESOLINE) tablet 25 mg  25 mg Oral 3 times daily Sedaliu, Tomor, MD   25 mg at 01/07/23 0933 ? metoprolol succinate XL (TOPROL-XL) 24 hr tablet 25 mg  25 mg Oral Daily Sedaliu, Tomor, MD   25 mg at 01/07/23 0933 ? polyethylene glycol (MIRALAX) packet 17 g  17 g Oral BID Peyton Bottoms, APRN   17 g at 01/06/23 2136 ? potassium and sodium phosphates (ELEMENTAL phosphorus 250 mg/packet) (Phos-NaK) oral powder 250 mg  250 mg Oral AC & HS Peyton Bottoms, APRN   250 mg at 01/07/23 7829 ? Potassium chloride SA (K-DUR,KLOR-CON M) 24 hr tablet 10 mEq  10 mEq Oral Daily Peyton Bottoms, APRN   10 mEq at 01/07/23 5621 ? rosuvastatin (CRESTOR) tablet 5 mg  5 mg Oral Daily Sedaliu, Tomor, MD   5 mg at 01/07/23 0933 ? senna (SENOKOT) tablet 17.2 mg  2 tablet Oral BID Peyton Bottoms, APRN   17.2 mg at 01/06/23 2135 ? sodium chloride 0.9 % flush 3 mL  3 mL IV Push Q8H Sedaliu, Tomor, MD   3 mL at 01/07/23 0552 ? tiotropium bromide (SPIRIVA RESPIMAT) 2.5 mcg/actuation inhalation spray 2 Inhalation  2 Inhalation Inhalation Daily Gearldine Bienenstock, [place]   2 Inhalation at 01/07/23 3086 ? sodium chloride 0.9% with 20 mEq KCl/L 75 mL/hr (01/06/23 2208) acetaminophen, albuterol **AND** tiotropium bromide, lactulose, ondansetron **OR** ondansetron (ZOFRAN) IV Push, oxyCODONE, sodium chloride  Objective  Objective: Vitals:I have reviewed the current vital signs Patient Vitals for the past 24 hrs: BP Temp Temp src Pulse Resp SpO2 01/07/23 0757 (!) 163/73 -- -- 77 18 (!) 91 % 01/07/23 0548 (!) 147/75 98.2 ?F (36.8 ?C) Axillary 83 18 (!) 93 % 01/06/23 2059 (!) 151/74 98.8 ?F (37.1 ?C) Axillary (!) 92 18 (!) 93 % 01/06/23 1451 138/67 97.7 ?F (36.5 ?C) Oral 62 16 94 % I/O's:Gross Totals (Last 24 hours) at 01/07/2023 1127Last data filed at 01/07/2023 0640Intake 1108.52 ml Output 2400 ml Net -1291.48 ml Physical Exam Constitutional:  Ill appearing, older male.  Sleeping, awoke to voice.  Alert and oriented X2.   Eyes  : Not pale, anicteric scleraeENT : moist oral mucosaNeck: No JVD notedCardiovascular: RRR S1 S2, no m/r/gChest: CTAB, diminished at bases.  No rales.  Work of breathing normal.Abdominal: Large, BS hypo, soft, nt GU : [place] cathExtremities:  Bilateral LE edema +1 on right, +3 on left, left leg red with cellulitic changesSkin : No rash on limited examNeurological:  Alert and oriented to person, place, and time. Psychiatric: Affect somewhat flatLabs:I have reviewed the current lab results Recent Labs Lab 01/10/240614 01/11/240012 01/11/240436 01/12/240632 01/13/241343 01/14/240514 NA 133*   < > 131* 133* 135* 133* K 2.9*   < > 3.2* 3.2* 3.5 4.0 CL 100   < > 99 100 100 101 CO2 18*   < > 18* 20* 20* 19* BUN 44*   < > 39* 38* 34* 38* CREATININE 4.35*   < > 4.09* 4.04* 4.02* 3.59* GLU 125*   < > 175* 157* 115* 116* CALCIUM 7.0*   < > 6.9* 7.1* 7.0* 7.0* MG 1.8  --  1.5*  1.5* 2.0 1.9 PHOS 3.9  --  3.2 2.9  --  3.5  < > = values in this interval not displayed. Recent Labs Lab 01/11/240012 01/11/240436 01/12/240632 01/14/240514 WBC 13.2* 13.2* 12.1* 9.4 HGB 10.2* 10.1* 9.7* 9.7* HCT 29.60* 29.60* 28.00* 28.60* PLT 183 182 251 303 Recent Labs Lab 01/08/241232 01/09/240513 01/10/240614 01/11/240012 ALKPHOS 70 80 89 112 BILITOT 0.6 0.5 0.4 0.3 BILIDIR 0.3 0.3 0.2  --  PROT 6.4 5.7* 5.7* 6.5 ALT 33 48 45 50 AST 41 69* 47* 58*  Recent Labs Lab 01/07/241336 PROTEINUA 2+* UROBILINOGEN <2.0    Signed: Claude Manges, APRN PACT Kidney CenterOffice: [phone] 14, 20249:07 AM

## 2023-01-07 NOTE — Plan of Care
Problem: Fall Injury RiskGoal: Absence of Fall and Fall-Related Injury1/14/2024 1710 by [employer], Juandaniel Manfredo, RNOutcome: Interventions implemented as appropriate1/14/2024 1710 by [employer], [place], RNOutcome: Interventions implemented as appropriateIntervention: Promote Injury-Free EnvironmentFlowsheets (Taken 01/07/2023 1710)Universal Fall Prevention: activity supervised assistive device/personal items within reach keep room clutter free with open pathway to the bathroom fall prevention program maintained safety round/check completed room organization consistent nonskid shoes/slippers when out of bed Problem: Skin Injury Risk IncreasedGoal: Skin Health and Integrity1/14/2024 1710 by Milani Lowenstein, RNOutcome: Interventions implemented as appropriate1/14/2024 1710 by Shunna Mikaelian, RNOutcome: Interventions implemented as appropriate Plan of Care Overview/ Patient Status    Assume care 0700-1900Patient alert and oriented x3. On RA denies SOB. Able to make needs known. Denies pain/discomfort at this time. Encourage ADLs, urinal within reach. Assist via RW to bathroom. Large BM x2 during shift, held BM regimen, Alex PA updated. Continue NS with KCI at 71ml/hr. Continue with POC, maintain safety, call light within reach.

## 2023-01-07 NOTE — Progress Notes
[facility]	General Medicine Progress NoteHospital Day # 7Attending Provider: Cindee Salt* Subjective: Interim History:  Routine inpatient following presented extremity cellulitis, edema, also found to have AKI, adult failure to thriveThe patient is seen and examined by me, resting comfortably in hospital bed, acute distress, events overnight.He denies CP, no palpitation, no SOB; afebrile, denies fevers or chills; tolerating diet, poor p.o. intake remains; denies acute painReview of Allergies/Meds/Hx: I have reviewed the patient's allergies, prior to admission meds, past medical/surgical, family and social hx. Allergies has No Known Allergies.Inpatient Medications:Scheduled Meds:Current Facility-Administered Medications Medication Dose Route Frequency Provider Last Rate Last Admin ? amLODIPine (NORVASC) tablet 10 mg  10 mg Oral Daily Sedaliu, Tomor, MD   10 mg at 01/07/23 0933 ? amoxicillin-clavulanate (AUGMENTIN) 500-125 mg tablet 500 mg  500 mg Oral 2 times daily Cindee Salt, MD     ? bisacodyL (DULCOLAX) EC tablet 10 mg  10 mg Oral Daily Peyton Bottoms, APRN   10 mg at 01/06/23 1308 ? doxycycline TAB/CAP 100 mg  100 mg Oral 2 times daily Peyton Bottoms, APRN   100 mg at 01/07/23 6578 ? ferrous sulfate (FEOSOL) tablet 325 mg  325 mg Oral BID WC Giuran-Benetato, Nena Polio, MD   325 mg at 01/07/23 0933 ? heparin (PORCINE) injection 5,000 Units  5,000 Units Subcutaneous Q8H Sedaliu, Tomor, MD   5,000 Units at 01/07/23 1322 ? hydrALAZINE (APRESOLINE) tablet 25 mg  25 mg Oral 3 times daily Sedaliu, Tomor, MD   25 mg at 01/07/23 1321 ? metoprolol succinate XL (TOPROL-XL) 24 hr tablet 25 mg  25 mg Oral Daily Sedaliu, Tomor, MD   25 mg at 01/07/23 0933 ? polyethylene glycol (MIRALAX) packet 17 g  17 g Oral BID Peyton Bottoms, APRN   17 g at 01/06/23 2136 ? rosuvastatin (CRESTOR) tablet 5 mg  5 mg Oral Daily Sedaliu, Tomor, MD   5 mg at 01/07/23 0933 ? senna (SENOKOT) tablet 17.2 mg  2 tablet Oral BID Peyton Bottoms, APRN   17.2 mg at 01/06/23 2135 ? sodium chloride 0.9 % flush 3 mL  3 mL IV Push Q8H Sedaliu, Tomor, MD   3 mL at 01/07/23 1323 ? tiotropium bromide (SPIRIVA RESPIMAT) 2.5 mcg/actuation inhalation spray 2 Inhalation  2 Inhalation Inhalation Daily Gearldine Bienenstock, PA   2 Inhalation at 01/07/23 0936 Continuous Infusions:? sodium chloride 0.9% with 20 mEq KCl/L 50 mL/hr (01/07/23 1221) PRN Meds:.acetaminophen, albuterol **AND** tiotropium bromide, ondansetron **OR** ondansetron (ZOFRAN) IV Push, oxyCODONE, sodium chlorideObjective: Vitals:I have reviewed the patient's current vital signs as documented in the patient's EMR.  , Last 24 hours: Temp:  [97.7 ?F (36.5 ?C)-98.8 ?F (37.1 ?C)] 98.2 ?F (36.8 ?C)Pulse:  [62-92] 78Resp:  [16-18] 18BP: (138-171)/(67-75) 171/75SpO2:  [91 %-94 %] 91 % and Most recent: Patient Vitals for the past 24 hrs: BP Temp Temp src Pulse Resp SpO2 01/07/23 1319 (!) 171/75 -- -- 78 18 -- 01/07/23 0757 (!) 163/73 -- -- 77 18 (!) 91 % 01/07/23 0548 (!) 147/75 98.2 ?F (36.8 ?C) Axillary 83 18 (!) 93 % 01/06/23 2059 (!) 151/74 98.8 ?F (37.1 ?C) Axillary (!) 92 18 (!) 93 % 01/06/23 1451 138/67 97.7 ?F (36.5 ?C) Oral 62 16 94 % I/O's:I have reviewed the patient's current I&O's as documented in the EMR.I/O this shift:In: - Out: 725 [Urine:725]Gross Totals (Last 24 hours) at 01/07/2023 1325Last data filed at 01/07/2023 1221Intake 1108.52 ml Output 3125 ml Net -2016.48 ml Procedures:None Physical ExamVitals and  nursing note reviewed. Constitutional:     Appearance: Normal appearance. He is obese. HENT:    Head: Normocephalic and atraumatic.    Nose: Nose normal.    Mouth/Throat:    Mouth: Mucous membranes are moist. Eyes:    Extraocular Movements: Extraocular movements intact.    Pupils: Pupils are equal, round, and reactive to light. Cardiovascular:    Rate and Rhythm: Normal rate and regular rhythm.    Pulses: Normal pulses.    Heart sounds: Normal heart sounds. Pulmonary:    Effort: Pulmonary effort is normal.    Breath sounds: Normal breath sounds.    Comments: Tolerating room airAbdominal:    Palpations: Abdomen is soft.    Comments: Poor p.o. intake remains Genitourinary:   Comments: Incontinent to urine, condom catheter in placeMusculoskeletal:    Cervical back: Normal range of motion and neck supple.    Comments: Limited ROM, able to stand and ambulate with a walker with a minimal assistance, with difficulty Skin:   General: Skin is warm and dry.    Capillary Refill: Capillary refill takes less than 2 seconds.    Findings: Erythema present.    Comments: Left lower extremity erythema remains Neurological:    General: No focal deficit present.    Mental Status: He is alert and oriented to person, place, and time. Psychiatric:       Mood and Affect: Mood normal.       Behavior: Behavior normal.  Labs:Estimated Creatinine Clearance: 21 mL/min (A) (by C-G formula based on SCr of 3.59 mg/dL (H)). Complete Blood CountResults in Past 7 DaysResult Component Current Result Hematocrit 28.60 (L) (01/07/2023) Hemoglobin 9.7 (L) (01/07/2023) MCH 30.6 (01/07/2023) MCHC 33.9 (01/07/2023) MCV 90.2 (01/07/2023) MPV 10.8 (01/07/2023) Platelets 303 (01/07/2023) RBC 3.17 (L) (01/07/2023) WBC 9.4 (01/07/2023)   Chemistry  Lab Results Component Value Date  NA 133 (L) 01/07/2023  K 4.0 01/07/2023  CL 101 01/07/2023  CO2 19 (L) 01/07/2023  BUN 38 (H) 01/07/2023  CREATININE 3.59 (H) 01/07/2023  GLU 116 (H) 01/07/2023  Lab Results Component Value Date  CALCIUM 7.0 (L) 01/07/2023  ALKPHOS 112 01/04/2023  AST 58 (H) 01/04/2023  ALT 50 01/04/2023  BILITOT 0.3 01/04/2023 Results in Past 7 DaysResult Component Current Result Magnesium 1.9 (01/07/2023)  I have reviewed the patient's labs within the last 24 hrs. Significant findings are Stable anemia, Na 133, /Cr 38/3.59, Mag 1.9, calcium 7.0.  Diagnostics:No new radiology.XR Chest PA or APResult Date: 1/10/2024XR CHEST PA OR AP Date: 01/03/2023 4:26 PM INDICATION: mild wheezing r/o fluid overload COMPARISON: XR CHEST PA AND LATERAL 2022-12-31 FINDINGS: Support Devices: None. There is mild fluid overload, central pulmonary vascular prominence and perihilar haziness. No pneumothorax or pleural effusion. Stable cardiomediastinal silhouette.  Mild fluid overload. Reported and signed by: Maudie Flakes, MD  [facility] Radiology and Biomedical Imaging Echo 2D Complete w Doppler and CFI if Ind Image Enhancement 3D and or bubblesResult Date: 01/01/2023~ * Very limited study.* Suboptimal visualization of the left ventricle, but it appears to be normal size with normal function.  LVEF estimated by visual assessment was between 55-60%.  Cannot rule out wall motion abnormalities.* Normal right ventricular cavity size and systolic function.* No significant valve abnormalities on this limited study.US RenalResult Date: 1/8/2024RENAL ULTRASOUND WITH DOPPLER HISTORY:  aki. COMPARISON:   Ultrasound 11/13/2021 TECHNIQUE:  Multiple longitudinal and transverse images of the kidneys were performed using a high-resolution linear array transducer. FINDINGS: BLADDER:  The urinary bladder is incompletely distended. The prostate gland is not  enlarged.  Bilateral ureteral jets are identified. RIGHT KIDNEY:  Right kidney measures 8.6 cm in length.  The renal echogenicity appears unremarkable. No stones, hydronephrosis or suspicious appearing mass lesions are identified. There are no perinephric collections. LEFT KIDNEY:  Left kidney measures 11.3  cm in length.  The renal echogenicity appears unremarkable. No stones, hydronephrosis or suspicious appearing mass lesions are identified. There are no perinephric collections.  DOPPLER:    Renal vascular color flow is identified    Unremarkable appearing renal ultrasound Reported and signed by: Donivan Scull, MD  [facility] Radiology and Biomedical Imaging [place] ED Chest Abdomen Pelvis wo IV ContrastResult Date: 1/7/2024CT ED CHEST ABDOMEN PELVIS WO IV CONTRAST HISTORY/INDICATION: Dyspnea status post fall. COMPARISON: NONE TECHNIQUE: [place] images of the chest, abdomen, and pelvis were obtained from the thoracic inlet to the pubic symphysis without intravenous contrast. Coronal and sagittal multiplanar reformatted images of the chest, abdomen and pelvis were provided. FINDINGS: CLAVICLES & SCAPULAE: Unremarkable RIB CAGE & STERNUM: No acute osseous injury. MEDIASTINUM: Unremarkable. LUNGS/AIRWAYS/PLEURA: Evidence of the large airways disease/possible chronic aspiration including mild diffuse bronchial wall thickening, linear bibasilar scarring, and a small amount of endotracheal debris is seen just proximal to the origin of the right mainstem bronchus (image 193 series 4). VASCULATURE: The aorta and its major branches are unremarkable. LYMPH NODES: No enlarged lymph nodes. LIVER & BILIARY: Unremarkable. SPLEEN: Unremarkable. PANCREAS: Unremarkable. ADRENAL GLANDS: Unremarkable. KIDNEYS: Atrophic right kidney.Marland Kitchen Exophytic left renal cyst. BOWEL: Unremarkable. URINARY BLADDER: Unremarkable. PELVIC VISCERA: Unremarkable. VASCULATURE: IVC filter. THORACIC SPINE: No acute osseous injury. LUMBAR SPINE: No acute osseous injury. Chronic appearing grade 1 spondylolisthesis of L5 over S1. Chronic bilateral L5 pars defects. SACRUM & PELVIS: Status post right hip arthroplasty. Severe degenerative changes noted at the left hip. SOFT TISSUES: Bilateral gynecomastia.  Evidence of chronic large airways disease/aspiration including endotracheal debris, linear bibasilar scarring, and diffuse bronchial wall thickening. No evidence of acute abnormality within the chest, abdomen, or pelvis. [place] Rad[facility] Notify System Classification: Routine. Report initiated by:  Vincenza Hews, MD Reported and signed by: Alberteen Sam, MD  [facility] Radiology and Biomedical Imaging US Duplex Lower Extremity Venous BilatResult Date: 1/7/2024US DUPLEX LOWER EXTREMITY VENOUS BILATERAL. HISTORY: Pain and swelling COMPARISON: None. TECHNIQUE: Grayscale, color and pulsed Doppler imaging were performed.  FINDINGS: There is symmetric phasicity in the lower external iliac veins. RIGHT LOWER EXTREMITY: COMMON FEMORAL VEIN: No thrombus. GREAT SAPHENOUS ORIGIN: No thrombus. UPPER PROFUNDA FEMORAL VEIN: No thrombus. FEMORAL VEIN: No thrombus. POPLITEAL VEIN: No thrombus. Appropriate augmentation to flow is noted in the popliteal vein. TIBIOPERONEAL TRUNK: No thrombus. POSTERIOR TIBIAL VEINS: Not well visualized. PERONEAL VEINS: Not well visualized.  LEFT LOWER EXTREMITY: COMMON FEMORAL VEIN: No thrombus. GREAT SAPHENOUS ORIGIN: No thrombus. UPPER PROFUNDA FEMORAL VEIN: No thrombus. FEMORAL VEIN: No thrombus. POPLITEAL VEIN: No thrombus. Appropriate augmentation to flow is noted in the popliteal vein. TIBIOPERONEAL TRUNK: Not well visualized. POSTERIOR TIBIAL VEINS: Not well visualized.   PERONEAL VEINS: Not well visualized.     No evidence of deep venous thrombosis in bilateral femoral/popliteal veins, within above limitations. Please note that ultrasound is less sensitive for evaluation of thrombus in calf veins. If there is continued clinical concern, re-evaluation can be performed after 5-7 days to evaluate for propagation of an unseen thrombus from the calf. [place] Radiology Notify System Classification: Routine. Report initiated by:  Vincenza Hews, MD Reported and signed by: Alberteen Sam, MD  [facility] Radiology and Biomedical Imaging [place] Head Cervical Spine wo IV ContrastResult Date:  1/7/2024CT HEAD CERVICAL SPINE WO IV CONTRAST ([facility]) INDICATION: Fall with head strike COMPARISON: None TECHNIQUE: [place] images were obtained from the vertex through T1 without intravenous contrast. Coronal and sagittal multiplanar reformatted images were provided. FINDINGS: Brain: There is no intracranial hemorrhage, edema, mass, mass effect or midline shift. There is no evidence of acute major vascular distribution infarct. Periventricular white matter hypodensity is nonspecific, though likely related to chronic small vessel ischemia. The ventricles and sulci are symmetrically enlarged, likely related to diffuse parenchymal volume loss.. The basal cisterns are patent. The paranasal sinuses and mastoid air cells are clear. The visualized orbits and osseous structures are unremarkable, noting left cataract surgery. Cervical Spine: There is no compression deformity or evidence for acute fracture or subluxation. The atlanto-occipital and atlanto-axial articulations are intact. Note is made of multilevel degenerative disc disease changes. 1. No evidence of acute intracranial abnormality.  2. No evidence for acute cervical spine fracture or traumatic subluxation. Please note that Noncontrast Head [place] is not sensitive for the detection of ischemic infarct. If ischemic infarct is of clinical concern, additional clinical or imaging evaluation is recommended. [place] Radiology Notify System Classification: Routine. Report initiated by:  Vincenza Hews, MD Reported and signed by: Lavera Guise, MD  [facility] Radiology and Biomedical Imaging CXRResult Date: 1/7/2024XR CHEST PA AND LATERAL INDICATION: fall, weakness COMPARISON: XR CHEST PA OR AP 2022-04-10 FINDINGS:   The cardiomediastinal silhouette is unchanged. Lungs are hypoinflated. No focal consolidation. Mild dependent atelectasis. The pleural spaces are clear. There is no acute osseous injury.  No acute cardiothoracic abnormality. [place] Radiology Notify System Classification: Routine. Reported and signed by: Alberteen Sam, MD  [facility] Radiology and Biomedical Imaging ECG/Tele Events: No ECG ordered todayI have reviewed the patient's ECG as resulted in the EMR.Assessment: 72 y.o. M with a PMH significant of HTN, HLD, CKD, presented to [facility] ED on 01/07 c/o generalized weakness calm bilateral lower extremity swollen, Initial workup with Na 129, seCr 4.25,The patient was initially diagnosed with a cellulitis, AKI, electrolyte derangement?On exam, resting comfortably in hospital bed, NAD while at restAble to stand and ambulate to bathroom, and back, using a walker, requires minimal assistance, reported independent at home?  Drove himself to ED?The patient may benefit from short-term rehab, however the patient declines short-term rehab for nowLLE cellulitis improving, erythema remains, likely venous stasis dermatitis, Ace bandage applied for decompressionElectrolyte improvingAKI slight improvementPrincipal Problem:  Cellulitis  SNOMED [place](R): CELLULITIS  Active Problems:  AKI (acute kidney injury) (HC Code)  SNOMED [place](R): ACUTE INJURY OF KIDNEY    Hyponatremia  SNOMED [place](R): HYPONATREMIA    Hypomagnesemia  SNOMED [place](R): HYPOMAGNESEMIA    Hypokalemia  SNOMED [place](R): HYPOKALEMIA    Generalized weakness  SNOMED (R): ASTHENIA  Plan: Treatment plan with no significant changes from previous day1. Left lower extremity cellulitis- with improvement-  completed vancomycin+ Unasyn for 7 days- appreciate id consult- transition to p.o. doxycycline + Augmentin today- follow fevers chills episode, follow daily CBC?# AKI- to poor p.o. intake?- initial BUN/Cr 4.25/55, a.m. creatinine 3.59- renal function slow to improve- renal ultrasound with no acute finding- appreciate nephrology consult-  continue gentle IVF NS with KCl at 50 mL?# electrolyte derangement, hyponatremia, hypokalemia, hypomagnesemia- likely due to poor p.o. intake?- K improving, Mag improving, hyponatremia remains- continue IVF at 50 mL- monitor electrolytes daily closely, watch fluid overload closely?# acute hypertension- the patient has initially presented with acute hypotension- BP improving- continue amlodipine- continue hydralazine, continue metoprolol- the patient was on Coreg once, unsure of reason  Coreg was discontinued, Coreg maybe a better BP control medication?HLD- continue Crestor?Generalized weakness/lives home alone- PT consult- the patient is able to stand and ambulate with a walker, with the assistance.- reported independent at home, does not have a walker- reportedly the patient drove himself , and his car is at ED proximal- the patient is not interested in short-term rehab# constipation- resolved- multiple large BM today? Disposition- nearly ready for discharge- renal function need further improvementThe patient is updated for his plan of careI have reviewed the consultants recommendations and/or discussed the plan of care with them directly. Care was discussed with the nursing and case managment teams. Patient/family was updated regarding illness, plan of care, and disposition plan. All orders were entered electronically. ?>40 minutes of time spent on chart review, counseling and/or coordinating care>50% of time spent on counseling/ direct care with patientSigned:Heidie Krall Dion Body, APRN Beeper: [phone] or [email or web address]/14/202412:03 PM

## 2023-01-07 NOTE — Plan of Care
Problem: Adult Inpatient Plan of CareGoal: Plan of Care ReviewOutcome: Interventions implemented as appropriateFlowsheets (Taken 01/06/2023 1931)Plan of Care Reviewed With: patientGoal: Optimal Comfort and WellbeingOutcome: Interventions implemented as appropriateIntervention: Provide Person-Centered CareFlowsheets (Taken 01/06/2023 1931)Trust Relationship/Rapport: care explained questions answered choices provided questions encouraged reassurance provided empathic listening provided thoughts/feelings acknowledged emotional support provided Plan of Care Overview/ Patient Status    Assumed patient care @0700NEURO : A/O x 4 BEHAVIOR: calm and cooperative to careDIET: renal non-dialysis diet with nutrition supplementsMOBILITY: unable to ambulate for this Rn, see PT note for additional information.PAIN: no complaints of pain during this shiftGI/GU: incontinent x 2RES: sating well in room airCV: no tele, denies any chest painPERIFERAL VASCULAR: + pulses on all extremitiesIV ACCESS: PIV on the left forearmSKIN: skin rash on abdominal fold, leg swelling and weeping open to airVITALS: vital signs are stable and within normal limitsVitals:  01/06/23 1451 BP: 138/67 Pulse: 62 Resp: 16 Temp: 97.7 ?F (36.5 ?C) NOTES: Scheduled medications given per [facility], takes pills whole with sips of water.Call light and personal belongings within reach, safety maintained will continue with plan of care.I have reviewed the patient's current medication orders.     Current Facility-Administered Medications Medication Dose Route Frequency Provider Last Rate Last Admin  amLODIPine (NORVASC) tablet 10 mg  10 mg Oral Daily Sedaliu, Tomor, MD   10 mg at 01/06/23 0846  [START ON 01/07/2023] amoxicillin-clavulanate (AUGMENTIN) 875-125 mg tablet 1 tablet  875 mg Oral 2 times daily Peyton Bottoms, APRN      ampicillin-sulbactam (UNASYN) 1.5 g in sodium chloride 0.9% 50 mL (mini-bag plus)  1.5 g Intravenous Gretta Arab, Franz Dell, APRN 200 mL/hr at 01/06/23 1513 1.5 g at 01/06/23 1513  bisacodyL (DULCOLAX) EC tablet 10 mg  10 mg Oral Daily Peyton Bottoms, APRN   10 mg at 01/06/23 0918  [START ON 01/07/2023] doxycycline TAB/CAP 100 mg  100 mg Oral 2 times daily Peyton Bottoms, APRN      ferrous sulfate (FEOSOL) tablet 325 mg  325 mg Oral BID WC Giuran-Benetato, Nena Polio, MD   325 mg at 01/06/23 1727  heparin (PORCINE) injection 5,000 Units  5,000 Units Subcutaneous Q8H Sedaliu, Tomor, MD   5,000 Units at 01/06/23 1515  hydrALAZINE (APRESOLINE) tablet 25 mg  25 mg Oral 3 times daily Sedaliu, Tomor, MD   25 mg at 01/06/23 1515  metoprolol succinate XL (TOPROL-XL) 24 hr tablet 25 mg  25 mg Oral Daily Sedaliu, Tomor, MD   25 mg at 01/06/23 0846  polyethylene glycol (MIRALAX) packet 17 g  17 g Oral BID Peyton Bottoms, APRN   17 g at 01/06/23 0846  potassium and sodium phosphates (ELEMENTAL phosphorus 250 mg/packet) (Phos-NaK) oral powder 250 mg  250 mg Oral AC & HS Peyton Bottoms, APRN   250 mg at 01/06/23 1727  Potassium chloride SA (K-DUR,KLOR-CON M) 24 hr tablet 10 mEq  10 mEq Oral Daily Peyton Bottoms, APRN   10 mEq at 01/06/23 0846  rosuvastatin (CRESTOR) tablet 5 mg  5 mg Oral Daily Sedaliu, Tomor, MD   5 mg at 01/06/23 0846  senna (SENOKOT) tablet 17.2 mg  2 tablet Oral BID Peyton Bottoms, APRN   17.2 mg at 01/06/23 0918  sodium chloride 0.9 % flush 3 mL  3 mL IV Push Q8H Sedaliu, Tomor, MD   3 mL at 01/06/23 1515  tiotropium bromide (SPIRIVA RESPIMAT) 2.5 mcg/actuation inhalation spray 2 Inhalation  2 Inhalation Inhalation Daily Gearldine Bienenstock, PA   2 Inhalation at 01/06/23 0846  Vancomycin  Therapy Placeholder   Intravenous placeholder Peyton Bottoms, APRN       sodium chloride 0.9% with 20 mEq KCl/L 75 mL/hr (01/06/23 1600) acetaminophen, albuterol **AND** tiotropium bromide, lactulose, ondansetron **OR** ondansetron (ZOFRAN) IV Push, oxyCODONE, sodium chlorideCurrent Facility-Administered Medications Medication Dose Route Frequency Last Rate  acetaminophen  650 mg Oral Q6H PRN    albuterol  2.5 mg Nebulization Q6H PRN    And  tiotropium bromide  2 Inhalation Inhalation Daily    amLODIPine  10 mg Oral Daily    [START ON 01/07/2023] amoxicillin-potassium clavulanate  875 mg Oral 2 times daily    ampicillin-sulbactam  1.5 g Intravenous Q8H 1.5 g (01/06/23 1513)  bisacodyL  10 mg Oral Daily    [START ON 01/07/2023] doxycycline  100 mg Oral 2 times daily    ferrous sulfate  325 mg Oral BID WC    heparin (PORCINE)  5,000 Units Subcutaneous Q8H    hydrALAZINE  25 mg Oral 3 times daily    lactulose  20 g Oral TID PRN    metoprolol succinate XL  25 mg Oral Daily    ondansetron  4 mg Oral Q6H PRN    Or  ondansetron (ZOFRAN) IV Push  4 mg IV Push Q6H PRN    oxyCODONE  5 mg Oral Q6H PRN    polyethylene glycol  17 g Oral BID    potassium and sodium phosphates (ELEMENTAL phosphorus 250 mg/packet)  250 mg Oral AC & HS    Potassium chloride SA  10 mEq Oral Daily    rosuvastatin  5 mg Oral Daily    senna  2 tablet Oral BID    sodium chloride  3 mL IV Push Q8H    sodium chloride  3 mL IV Push PRN for Line Care    sodium chloride 0.9% with 20 mEq KCl/L  75 mL/hr Intravenous Continuous 75 mL/hr (01/06/23 1600)  Vancomycin Therapy Placeholder   Intravenous placeholder   .

## 2023-01-07 NOTE — Plan of Care
Problem: Adult Inpatient Plan of CareGoal: Plan of Care ReviewOutcome: Interventions implemented as appropriate Problem: Adult Inpatient Plan of CareGoal: Plan of Care ReviewOutcome: Interventions implemented as appropriate Problem: Adult Inpatient Plan of CareGoal: Patient-Specific Goal (Individualized)Outcome: Interventions implemented as appropriate Problem: Adult Inpatient Plan of CareGoal: Absence of Hospital-Acquired Illness or InjuryOutcome: Interventions implemented as appropriate Problem: Adult Inpatient Plan of CareGoal: Optimal Comfort and WellbeingOutcome: Interventions implemented as appropriate Problem: Adult Inpatient Plan of CareGoal: Readiness for Transition of CareOutcome: Interventions implemented as appropriate Problem: Fall Injury RiskGoal: Absence of Fall and Fall-Related InjuryOutcome: Interventions implemented as appropriate Plan of Care Overview/ Patient Status    Assumed care of patient at 1900, alert and oriented. Denies pain or discomfort. ABX  Ampicillin-sulbactam 1.5 g completed, no adverse reaction noted. External catheter in place draining yellow urine. Patient has a large BM this shift. No acute respiratory distress noted. Medication administered per MAR. Slept well during the night. Frequent rounding done, able to make needs known. Call light within reach at all times.

## 2023-01-08 ENCOUNTER — Encounter: Payer: Self-pay | Admitting: Pharmacist

## 2023-01-08 ENCOUNTER — Other Ambulatory Visit: Payer: Self-pay | Admitting: Family Medicine

## 2023-01-08 ENCOUNTER — Ambulatory Visit: Payer: Medicare HMO | Admitting: Pharmacist

## 2023-01-08 DIAGNOSIS — E1136 Type 2 diabetes mellitus with diabetic cataract: Secondary | ICD-10-CM

## 2023-01-08 DIAGNOSIS — I5022 Chronic systolic (congestive) heart failure: Secondary | ICD-10-CM

## 2023-01-08 DIAGNOSIS — N401 Enlarged prostate with lower urinary tract symptoms: Secondary | ICD-10-CM

## 2023-01-08 DIAGNOSIS — I10 Essential (primary) hypertension: Secondary | ICD-10-CM

## 2023-01-08 LAB — CBC WITH AUTO DIFFERENTIAL
BKR BUN / CREAT RATIO: 12.3 % (ref 11.0–15.0)
BKR WAM ABSOLUTE IMMATURE GRANULOCYTES.: 0.13 x 1000/??L (ref 0.00–0.30)
BKR WAM ABSOLUTE LYMPHOCYTE COUNT.: 1.03 x 1000/??L (ref 0.60–3.70)
BKR WAM ABSOLUTE NRBC (2 DEC): 0 x 1000/??L (ref 0.00–1.00)
BKR WAM ANALYZER ANC: 7.31 x 1000/??L (ref 2.00–7.60)
BKR WAM BASOPHIL ABSOLUTE COUNT.: 0.04 x 1000/??L (ref 0.00–1.00)
BKR WAM BASOPHILS: 0.4 % (ref 0.0–1.4)
BKR WAM EOSINOPHIL ABSOLUTE COUNT.: 0.14 x 1000/??L (ref 0.00–1.00)
BKR WAM EOSINOPHILS: 1.5 % (ref 0.0–5.0)
BKR WAM HEMATOCRIT (2 DEC): 28.9 % — ABNORMAL LOW (ref 38.50–50.00)
BKR WAM HEMOGLOBIN: 9.8 g/dL — ABNORMAL LOW (ref 13.2–17.1)
BKR WAM IMMATURE GRANULOCYTES: 1.4 % — ABNORMAL HIGH (ref 0.0–1.0)
BKR WAM LYMPHOCYTES: 11 % — ABNORMAL LOW (ref 17.0–50.0)
BKR WAM MCH (PG): 31 pg — ABNORMAL HIGH (ref 27.0–33.0)
BKR WAM MCHC: 33.9 g/dL (ref 31.0–36.0)
BKR WAM MCV: 91.5 fL (ref 80.0–100.0)
BKR WAM MONOCYTE ABSOLUTE COUNT.: 0.73 x 1000/??L (ref 0.00–1.00)
BKR WAM MONOCYTES: 7.8 % (ref 4.0–12.0)
BKR WAM MPV: 10.9 fL (ref 8.0–12.0)
BKR WAM NEUTROPHILS: 77.9 % — ABNORMAL HIGH (ref 39.0–72.0)
BKR WAM NUCLEATED RED BLOOD CELLS: 0 % (ref 0.0–1.0)
BKR WAM PLATELETS: 335 x1000/ÂµL (ref 150–420)
BKR WAM RDW-CV: 12.3 % (ref 11.0–15.0)
BKR WAM RED BLOOD CELL COUNT.: 3.16 M/??L — ABNORMAL LOW (ref 4.00–6.00)
BKR WAM WHITE BLOOD CELL COUNT: 9.4 x1000/ÂµL (ref 4.0–11.0)

## 2023-01-08 LAB — BASIC METABOLIC PANEL
BKR ANION GAP: 14 g/dL — ABNORMAL LOW (ref 5–16)
BKR BLOOD UREA NITROGEN: 36 mg/dL — ABNORMAL HIGH (ref 7–18)
BKR CALCIUM: 6.9 mg/dL — CL (ref 8.5–10.5)
BKR CHLORIDE: 102 mmol/L (ref 98–107)
BKR CO2: 17 mmol/L — ABNORMAL LOW (ref 21–32)
BKR CREATININE: 3.42 mg/dL — ABNORMAL HIGH (ref 0.80–1.30)
BKR EGFR, CREATININE (CKD-EPI 2021): 19 mL/min/{1.73_m2} — ABNORMAL LOW (ref >=60)
BKR GLUCOSE: 120 mg/dL — ABNORMAL HIGH (ref 70–100)
BKR POTASSIUM: 3.8 mmol/L (ref 3.5–5.1)
BKR SODIUM: 133 mmol/L — ABNORMAL LOW (ref 136–145)

## 2023-01-08 LAB — MAGNESIUM: BKR MAGNESIUM: 1.8 mg/dL (ref 1.8–2.5)

## 2023-01-08 LAB — PHOSPHORUS     (BH GH L LMW YH): BKR PHOSPHORUS: 4.3 mg/dL (ref 2.5–4.9)

## 2023-01-08 MED ORDER — SENNOSIDES 8.6 MG TABLET
8.6 mg | Freq: Every evening | ORAL | Status: DC
Start: 2023-01-08 — End: 2023-01-11

## 2023-01-08 MED ORDER — SODIUM BICARBONATE 325 MG TABLET
325 mg | Freq: Two times a day (BID) | ORAL | Status: DC
Start: 2023-01-08 — End: 2023-01-09
  Administered 2023-01-09 (×2): 325 mg via ORAL

## 2023-01-08 MED ORDER — POLYETHYLENE GLYCOL 3350 17 GRAM ORAL POWDER PACKET
17 gram | Freq: Every day | ORAL | Status: DC | PRN
Start: 2023-01-08 — End: 2023-01-11

## 2023-01-08 MED ORDER — CALCIUM GLUCONATE 1 GRAM/100 ML IN SODIUM CHLORIDE,ISO-OSM IV SOLUTION
1100 gram/00 mL | Freq: Once | INTRAVENOUS | Status: CP
Start: 2023-01-08 — End: ?
  Administered 2023-01-08: 18:00:00 1 mL/h via INTRAVENOUS

## 2023-01-08 NOTE — Progress Notes (Signed)
01/08/2023 Name: Russell [name]. MRN: [id number] DOB: 12/16/1951  Chief Complaint  Patient presents with   Medication Assistance   Medication Management    Russell [name]. is a 72 y.o. year old male who presented for a telephone visit.   They were referred to the pharmacist by their PCP for assistance in managing diabetes, medication access, and complex medication management.    Subjective:  Today reports seen as planned by Urologist, Dr. Yves [name] on 11/28. Reports provider advised that he is retiring and patient to establish care with new Urologist. Reports discussed that patient taking both doxazosin + tamsulosin (both Alpha 1 Blockers) and states Urologist advised him to continue on both for now, but to discuss again with new Urologist once established care.   Care Team: Primary Care Provider: Olin Hauser, DO ; Next Scheduled Visit: 04/19/2023 Cardiologist: Minna Merritts, MD; Next Scheduled Visit: 04/16/2023 Urology: Royston Cowper  Medication Access/Adherence  Current Pharmacy:  [place], [place] [address] [place] [address] Phone: [phone] Fax: [place], [place] [address] Phone: [phone] Fax: [phone]  [facility] [place], [place] [address] Phone: [phone] Fax: [phone]   Patient reports affordability concerns with their medications: Yes  Patient reports access/transportation concerns to their pharmacy: No  Patient reports adherence concerns with their medications:  No    Diabetes:   Current medications:  - metformin ER 500 mg - 2 tablets daily - Jardiance 10 mg daily   Recalls recent morning fasting readings ranging 149-160   Patient denies hypoglycemic s/sx including dizziness, shakiness, sweating.     Current meal patterns:  - Breakfast: cereal - Lunch: ham or [place] sandwich - Supper: baked chicken and beans and collard greens - Dessert: instant banana pudding or Jello or a banana - Snacks: nab crackers or peanuts - Drinks: water and 1 cup of coffee/day   Current physical activity: Reports walking less recently since it has been cold outside  Current medication access support: none - Today reports received application for Jardiance patient assistance program through [employer] as mailed by [facility], but then further reviewed finances and realized that he does not meet income criteria for Jardiance assistance program - Based on reported criteria patient would also not meet criteria for [place] patient assistance program - Reports did pick up and start taking Jardiance 10 mg daily as prescription was affordable when not in coverage gap of his Part D coverage   Hypertension/Systolic CHF/Cardiomyopathy:   Patient followed by [facility]   See above - patient picked up and started taking Jardiance 10 mg daily as directed  Current medications:  - carvedilol 25 mg twice daily - doxazosin 1 mg twice daily - isosorbide ER 60 mg - 1.5 tablets (90 mg) twice daily - Entresto 97/103 twice daily - spironolactone 25 mg daily  Reports picked up and restarted taking as directed by Cardiology on 11/21/2022 - Jardiance 10 mg daily   Patient has an automated, upper arm home BP cuff Current blood pressure readings readings:  - 1/8: 115/60, HR 67 - 1/9:120/66, HR 66 - 1/11: 121/70, HR 66 - 1/12:120/69, HR 67 - 1/13: 117/72, HR 64  Reports takes positional changes slowly as discussed  Denies symptoms of hypotension such as dizziness  or lightheadedness  Reports monitors home weight; denies any signs of swelling   Current physical activity: Reports walking less recently since it has been cold   Current medication access support: Entresto patient assistance from  [employer] (paperwork completed by Cardiology)     Objective:  Lab Results  Component Value Date   HGBA1C 8.1 (H) 10/11/2022    Lab Results  Component Value Date   CREATININE 1.11 10/11/2022   BUN 14 10/11/2022   NA 142 10/11/2022   K 4.0 10/11/2022   CL 106 10/11/2022   CO2 29 10/11/2022    Lab Results  Component Value Date   CHOL 159 10/11/2022   HDL 73 10/11/2022   LDLCALC 70 10/11/2022   TRIG 79 10/11/2022   CHOLHDL 2.2 10/11/2022   BP Readings from Last 3 Encounters:  10/18/22 136/77  10/10/22 (!) 160/84  10/05/22 (!) 166/98   Pulse Readings from Last 3 Encounters:  10/18/22 60  10/10/22 62  10/05/22 65     Medications Reviewed Today     Reviewed by Manuela Neptune, RPH-CPP (Pharmacist) on 11/20/22 at 1050  Med List Status: <None>   Medication Order Taking? Sig Documenting Provider Last Dose Status Informant  amiodarone (PACERONE) 200 MG tablet [id number] Yes TAKE 1/2 TABLET EVERY DAY Allred, James, MD Taking Active   Blood Glucose Monitoring Suppl (ONE TOUCH ULTRA 2) w/Device KIT [id number]  Use to check blood sugar x 1 daily Smitty Cords, DO  Active   brimonidine [facility]) 0.2 % ophthalmic solution [id number] Yes Place 1 drop into both eyes 2 (two) times daily. [provider] Taking Active   carvedilol (COREG) 25 MG tablet [id number] Yes Take 1 tablet (25 mg total) by mouth 2 (two) times daily. Antonieta Iba, MD Taking Active   Cholecalciferol (VITAMIN D3) 2000 units TABS [id number] Yes Take 1 capsule by mouth daily.  [provider] Taking Active Spouse/Significant Other  dorzolamide-timolol (COSOPT) 22.3-6.8 MG/ML ophthalmic solution [id number] Yes Place 1 drop into both eyes 2 (two) times daily. [provider] Taking Active   doxazosin (CARDURA) 1 MG tablet [id number] Yes Take 1 tablet (1 mg total) by mouth 2 (two) times daily. Antonieta Iba, MD Taking Active   empagliflozin (JARDIANCE) 10 MG TABS tablet  [id number]  Take 1 tablet (10 mg total) by mouth daily before breakfast. Antonieta Iba, MD  Active   finasteride (PROSCAR) 5 MG tablet [id number] Yes Take 5 mg by mouth daily. [provider] Taking Active   isosorbide mononitrate (IMDUR) 60 MG 24 hr tablet [id number] Yes Take 1.5 tablets (90 mg total) by mouth 2 (two) times daily. Antonieta Iba, MD Taking Active   latanoprost (XALATAN) 0.005 % ophthalmic solution [id number] Yes Place 1 drop into both eyes at bedtime.  [provider] Taking Active Spouse/Significant Other  metFORMIN (GLUCOPHAGE-XR) 500 MG 24 hr tablet [id number] Yes Take 2 tablets (1,000 mg total) by mouth daily with breakfast. Smitty Cords, DO Taking Active   nitroGLYCERIN (NITROSTAT) 0.4 MG SL tablet [id number]  DISSOLVE ONE TABLET UNDER THE TONGUE EVERY 5 MINUTES AS NEEDED FOR CHEST PAIN.  DO NOT EXCEED A TOTAL OF 3 DOSES IN 15 MINUTES Gollan, Tollie Pizza, MD  Active   OneTouch Delica Lancets 33G [place] [id number]  Use to check blood sugar up to 1 time daily Smitty Cords, DO  Active   [facility] ULTRA test strip [id number]  Check blood sugar 1 x daily Smitty Cords, DO  Active  polyvinyl alcohol (LIQUIFILM TEARS) 1.4 % ophthalmic solution [id number] No Place 2 drops into both eyes every 8 (eight) hours.  Patient not taking: Reported on 11/20/2022   Loletha Grayer, MD Not Taking Active   pravastatin (PRAVACHOL) 10 MG tablet [id number] Yes TAKE 1 TABLET AT BEDTIME Olin Hauser, DO Taking Active   sacubitril-valsartan (ENTRESTO) 97-103 MG [id number] Yes Take 1 tablet by mouth 2 (two) times daily. Theora Gianotti, NP Taking Active   spironolactone (ALDACTONE) 25 MG tablet [id number] No Take 1 tablet (25 mg total) by mouth daily.  Patient not taking: Reported on 11/20/2022   Minna Merritts, MD Not Taking Active   tamsulosin [facility]) 0.4 MG CAPS capsule [id number] Yes Take 0.4 mg by mouth daily. [provider] Taking Active               Assessment/Plan:   Patient requesting referral to new Urologist as Dr. Yves [name] now retiring   Will collaborate with PCP  Diabetes: - Currently uncontrolled - Reviewed long term cardiovascular and renal outcomes of uncontrolled blood sugar - Reviewed goal A1c, goal fasting, and goal 2 hour post prandial glucose - Reviewed dietary modifications including strategies for limiting carbohydrate portion sizes and increasing intake of non-starchy vegetables - Exercise: find patient has access to gym membership through his health plan  Reports will join gym to restart walking (indoors when too cold outside) - Recommend to check glucose, keep log of results and have record to review during future appointments  Encourage patient to use glucose checks as feedback on dietary choices - Identify [employer] and [place] funds are both currently closed. However, provide patient with links to both fund websites via [place] for patient to sign up for waitlist for [employer] and to sign up for alerts for [employer]     Hypertension/Systolic CHF/Cardiomyopathy: - Reviewed long term cardiovascular and renal outcomes of uncontrolled blood pressure - Reviewed appropriate blood pressure monitoring technique and reviewed goal blood pressure. Recommended to check home blood pressure and heart rate, keep log of results and have record to review during future appointments - Encourage patient to continue to take positional changes slowly - Identify [employer] and [place] are both currently closed. However, provide patient with links to both fund websites via [place] for patient to sign up for waitlist for [employer] and to sign up for alerts for [employer]       Follow Up Plan: Clinical Pharmacist to follow up with patient by telephone on 02/09/2023 at  10:45 AM    Russell [name], PharmD, [place], [place] [phone]

## 2023-01-08 NOTE — Patient Instructions (Signed)
Goals Addressed             This Visit's Progress    Pharmacy Goals       Our goal A1c is less than 7%. This corresponds with fasting sugars less than 130 and 2 hour after meal sugars less than 180. Please keep a log of your results when checking your blood sugar    Our blood pressure goal is less than 130/80.  To appropriately check your blood pressure, make sure you do the following:  1) Avoid caffeine, exercise, or tobacco products for 30 minutes before checking. Empty your bladder. 2) Sit with your back supported in a flat-backed chair. Rest your arm on something flat (arm of the chair, table, etc). 3) Sit still with your feet flat on the floor, resting, for at least 5 minutes.  4) Check your blood pressure. Take 1-2 readings.  5) Write down these readings and bring with you to any provider appointments.  Bring your home blood pressure machine with you to a provider's office for accuracy comparison at least once a year.   Make sure you take your blood pressure medications before you come to any office visit, even if you were asked to fast for labs.  Our goal bad cholesterol, or LDL, is less than 70 . This is why it is important to continue taking your pravastatin.   Thank you!   Alec Mcphee Audrey Eller, PharmD, BCACP, CPP Clinical Pharmacist [facility] [phone]            

## 2023-01-08 NOTE — Plan of Care
Problem: Adult Inpatient Plan of CareGoal: Plan of Care ReviewOutcome: Interventions implemented as appropriateGoal: Patient-Specific Goal (Individualized)Outcome: Interventions implemented as appropriateGoal: Absence of Hospital-Acquired Illness or InjuryOutcome: Interventions implemented as appropriateGoal: Optimal Comfort and WellbeingOutcome: Interventions implemented as appropriateGoal: Readiness for Transition of CareOutcome: Interventions implemented as appropriate Plan of Care Overview/ Patient Status    Vitals:  01/07/23 2210 BP: (!) 157/71 Pulse: 85 Resp: 20 Temp: 97.7 F (36.5 C) SpO2: 94% Patient alert and oriented, denies any pain or discomfort, remains on RA, in no acute resp distress, voiding cyu in urinal. All meds administered per [facility] and IVF infusing as ordered. Robitussin given for cough with good effect. Patient in bed, resting comfortably at this time. Poc continues.

## 2023-01-08 NOTE — Plan of Care
Problem: Adult Inpatient Plan of CareGoal: Plan of Care ReviewOutcome: Interventions implemented as appropriateGoal: Patient-Specific Goal (Individualized)Outcome: Interventions implemented as appropriateGoal: Absence of Hospital-Acquired Illness or InjuryOutcome: Interventions implemented as appropriateGoal: Optimal Comfort and WellbeingOutcome: Interventions implemented as appropriateGoal: Readiness for Transition of CareOutcome: Interventions implemented as appropriate Problem: Physical Therapy GoalsGoal: Physical Therapy GoalsDescription: Pt will complete bed mobility with modified (I) Pt will complete sit to stand transfers with RW and supervision Pt will ambulate 50 ft with RW and supervision Above goals appear unrealistic at this time, new goals (01/05/23)1. Patient will perform bed mobility with supervision2. Patient will perform transfers with supervision using appropriate assistive device 3. Patient will ambulate a minimum of 15 feet with supervision using appropriate assistive device  Outcome: Interventions implemented as appropriate Problem: Skin Injury Risk IncreasedGoal: Skin Health and IntegrityOutcome: Interventions implemented as appropriate Problem: Fall Injury RiskGoal: Absence of Fall and Fall-Related InjuryOutcome: Interventions implemented as appropriate Plan of Care Overview/ Patient Status    Assumed care at 2300. Pt is alert and oriented x 4, able to make his needs known. Pt given cough medication for his cough with fair effect. Assisted with using the bedpan for a BM. IV fluids running at 50 ml/hr, tolerating well. Denies any complaints of pain or discomfort at this time. Call bell in reach, hourly monitoring maintained.

## 2023-01-08 NOTE — Progress Notes
[facility]	Infectious Disease Progress NoteAttending Provider: Merdis Delay, MD PCP: Chales Abrahams, PiyushHospital Day: 8Subjective: Interim History:  No acute events overnight.  Reports feeling somewhat better, no fevers, no shortness of breath or cough, no chest pain, no abdominal pain, no diarrheaReview of Allergies/Meds/Hx: Allergies have been reviewed.Current Medications:amLODIPine, 10 mg, Dailyamoxicillin-potassium clavulanate, 500 mg, 2 times dailybisacodyL, 10 mg, Dailydoxycycline, 100 mg, 2 times dailyferrous sulfate, 325 mg, BID WCheparin (PORCINE), 5,000 Units, Q8HhydrALAZINE, 50 mg, 3 times dailymetoprolol succinate XL, 25 mg, Dailyrosuvastatin, 5 mg, Dailysenna, 2 tablet, Nightlysodium bicarbonate, 650 mg, BIDsodium chloride, 3 mL, Q8Htiotropium bromide, 2 Inhalation, DailyCurrent Infusions:? sodium chloride 0.9% with 20 mEq KCl/L 50 mL/hr (01/08/23 0054) PRN Medications:acetaminophen, albuterol **AND** tiotropium bromide, dextromethorphan-guaiFENesin, ondansetron **OR** ondansetron (ZOFRAN) IV Push, oxyCODONE, polyethylene glycol, sodium chlorideObjective: Vitals:Temp:  [97.7 ?F (36.5 ?C)-97.9 ?F (36.6 ?C)] 97.8 ?F (36.6 ?C)Pulse:  [77-86] 77Resp:  [16-20] 16BP: (138-157)/(71-80) 138/80SpO2:  [94 %-95 %] 95 %SpO2:  [94 %-95 %] 95 % (01/15 1458) Gross Totals (Last 24 hours) at 01/08/2023 1641Last data filed at 01/08/2023 1000Intake 680 ml Output 1100 ml Net -420 ml Lungs clear, heart S1-S2 regular, abdomen soft no tenderness, extremities left lower extremity decreased edema and decreased erythemaLabs:Recent Labs Lab 01/13/241343 01/14/240514 01/15/240715 NA 135* 133* 133* K 3.5 4.0 3.8 CL 100 101 102 CO2 20* 19* 17* BUN 34* 38* 36* CREATININE 4.02* 3.59* 3.42* GLU 115* 116* 120* CALCIUM 7.0* 7.0* 6.9* MG 2.0 1.9 1.8 Recent Labs Lab 01/12/240632 01/14/240514 01/15/240715 WBC 12.1* 9.4 9.4 HGB 9.7* 9.7* 9.8* HCT 28.00* 28.60* 28.90* PLT 251 303 335 No results for input(s): APTT, INR, PTT in the last 168 hours.No results found for: TROPONINIDiagnostics and microbiology reviewedAssessment & Plan: 1. Left lower extremity cellulitis  improvingPlan:p.o. doxycycline and Augmentin to complete 14 days course of treatment.  I will see patient as needed please call with any additional questions.Signed:Antwane Grose, MD Please page using smart web (Hours 8 AM to 4 PM)1/15/20242:16 PM

## 2023-01-08 NOTE — Progress Notes
[facility]	 [facility]	General Medicine Progress NoteHospital Day # 8Attending Provider: Merdis Delay, MD Subjective: Interim History:  Routine inpatient follow-up, initially presented with left lower extremity swollen/erythema.  Also found to have AKI, electrolyte derangement, generalized weaknessThe patient is seen and examined by me, resting comfortably in hospital bed, no acute distress, no acute events overnight.He denies CP, no palpitation, no acute SOB; afebrile, denies fevers chills; tolerating diet, no NVD; c/o mild-to-moderate pain to [facility] Allergies/Meds/Hx: I have reviewed the patient's allergies, prior to admission meds, past medical/surgical, family and social hx. Allergies has No Known Allergies.Inpatient Medications:Scheduled Meds:Current Facility-Administered Medications Medication Dose Route Frequency Provider Last Rate Last Admin ? amLODIPine (NORVASC) tablet 10 mg  10 mg Oral Daily Sedaliu, Tomor, MD   10 mg at 01/08/23 8295 ? amoxicillin-clavulanate (AUGMENTIN) 500-125 mg tablet 500 mg  500 mg Oral 2 times daily Cindee Salt, MD   500 mg at 01/08/23 6213 ? bisacodyL (DULCOLAX) EC tablet 10 mg  10 mg Oral Daily Peyton Bottoms, APRN   10 mg at 01/06/23 0865 ? doxycycline TAB/CAP 100 mg  100 mg Oral 2 times daily Peyton Bottoms, APRN   100 mg at 01/08/23 7846 ? ferrous sulfate (FEOSOL) tablet 325 mg  325 mg Oral BID WC Giuran-Benetato, Nena Polio, MD   325 mg at 01/08/23 0852 ? heparin (PORCINE) injection 5,000 Units  5,000 Units Subcutaneous Q8H Sedaliu, Tomor, MD   5,000 Units at 01/08/23 0524 ? hydrALAZINE (APRESOLINE) tablet 50 mg  50 mg Oral 3 times daily Peyton Bottoms, APRN   50 mg at 01/08/23 9629 ? metoprolol succinate XL (TOPROL-XL) 24 hr tablet 25 mg  25 mg Oral Daily Sedaliu, Tomor, MD   25 mg at 01/08/23 5284 ? rosuvastatin (CRESTOR) tablet 5 mg  5 mg Oral Daily Sedaliu, Tomor, MD   5 mg at 01/08/23 1324 ? senna (SENOKOT) tablet 17.2 mg  2 tablet Oral Nightly Peyton Bottoms, APRN     ? sodium chloride 0.9 % flush 3 mL  3 mL IV Push Q8H Sedaliu, Tomor, MD   3 mL at 01/07/23 1323 ? tiotropium bromide (SPIRIVA RESPIMAT) 2.5 mcg/actuation inhalation spray 2 Inhalation  2 Inhalation Inhalation Daily Gearldine Bienenstock, PA   2 Inhalation at 01/08/23 0853 Continuous Infusions:? sodium chloride 0.9% with 20 mEq KCl/L 50 mL/hr (01/08/23 0054) PRN Meds:.acetaminophen, albuterol **AND** tiotropium bromide, dextromethorphan-guaiFENesin, ondansetron **OR** ondansetron (ZOFRAN) IV Push, oxyCODONE, polyethylene glycol, sodium chlorideObjective: Vitals:I have reviewed the patient's current vital signs as documented in the patient's EMR.  , Last 24 hours: Temp:  [97.7 ?F (36.5 ?C)-97.9 ?F (36.6 ?C)] 97.7 ?F (36.5 ?C)Pulse:  [78-92] 85Resp:  [18-20] 18BP: (141-171)/(71-75) 147/75SpO2:  [94 %] 94 % and Most recent: Patient Vitals for the past 24 hrs: BP Temp Temp src Pulse Resp SpO2 01/08/23 0529 (!) 147/75 97.7 ?F (36.5 ?C) Oral 85 18 94 % 01/07/23 2210 (!) 157/71 97.7 ?F (36.5 ?C) Oral 85 20 94 % 01/07/23 2051 (!) 152/75 97.9 ?F (36.6 ?C) Oral 86 18 94 % 01/07/23 1436 (!) 141/75 97.8 ?F (36.6 ?C) Axillary (!) 92 18 94 % 01/07/23 1319 (!) 171/75 -- -- 78 18 -- I/O's:I have reviewed the patient's current I&O's as documented in the EMR.No intake/output data recorded.Gross Totals (Last 24 hours) at 01/08/2023 0906Last data filed at 01/08/2023 0529Intake 680 ml Output 1825 ml Net -1145 ml Procedures:None Physical ExamVitals and nursing note reviewed. Constitutional:     Appearance: Normal appearance. He is obese. HENT:  Head: Normocephalic and atraumatic.    Nose: Nose normal.    Mouth/Throat:    Mouth: Mucous membranes are moist. Eyes:    Extraocular Movements: Extraocular movements intact. Pupils: Pupils are equal, round, and reactive to light. Cardiovascular:    Rate and Rhythm: Normal rate and regular rhythm.    Pulses: Normal pulses.    Heart sounds: Normal heart sounds. Pulmonary:    Effort: Pulmonary effort is normal.    Breath sounds: Normal breath sounds.    Comments: Tolerating room airAbdominal:    Comments: Tolerating diet, having BMs Genitourinary:   Comments: Voids with a urinalMusculoskeletal:    Cervical back: Normal range of motion and neck supple.    Comments: Compromised ROM, able to stand and ambulate with a walker, with the assistance Skin:   General: Skin is warm and dry.    Capillary Refill: Capillary refill takes less than 2 seconds. Neurological:    General: No focal deficit present.    Mental Status: He is alert and oriented to person, place, and time. Psychiatric:       Mood and Affect: Mood normal.       Behavior: Behavior normal.  Labs:Estimated Creatinine Clearance: 21 mL/min (A) (by C-G formula based on SCr of 3.59 mg/dL (H)).Complete Blood CountResults in Past 7 DaysResult Component Current Result Hematocrit 28.90 (L) (01/08/2023) Hemoglobin 9.8 (L) (01/08/2023) MCH 31.0 (01/08/2023) MCHC 33.9 (01/08/2023) MCV 91.5 (01/08/2023) MPV 10.9 (01/08/2023) Platelets 335 (01/08/2023) RBC 3.16 (L) (01/08/2023) WBC 9.4 (01/08/2023)   Chemistry  Lab Results Component Value Date  NA 133 (L) 01/08/2023  K 3.8 01/08/2023  CL 102 01/08/2023  CO2 17 (L) 01/08/2023  BUN 36 (H) 01/08/2023  CREATININE 3.42 (H) 01/08/2023  GLU 120 (H) 01/08/2023  Lab Results Component Value Date  CALCIUM 6.9 (LL) 01/08/2023  ALKPHOS 112 01/04/2023  AST 58 (H) 01/04/2023  ALT 50 01/04/2023  BILITOT 0.3 01/04/2023  Results in Past 7 DaysResult Component Current Result Magnesium 1.8 (01/08/2023)  I have reviewed the patient's labs within the last 24 hrs. Significant findings are Stable anemia, Na 133, calcium 6.9, BUN/Cr 36/3.42.  Diagnostics:No new radiology.XR Chest PA or APResult Date: 1/10/2024XR CHEST PA OR AP Date: 01/03/2023 4:26 PM INDICATION: mild wheezing r/o fluid overload COMPARISON: XR CHEST PA AND LATERAL 2022-12-31 FINDINGS: Support Devices: None. There is mild fluid overload, central pulmonary vascular prominence and perihilar haziness. No pneumothorax or pleural effusion. Stable cardiomediastinal silhouette.  Mild fluid overload. Reported and signed by: Maudie Flakes, MD  [facility] Radiology and Biomedical Imaging Echo 2D Complete w Doppler and CFI if Ind Image Enhancement 3D and or bubblesResult Date: 01/01/2023~ * Very limited study.* Suboptimal visualization of the left ventricle, but it appears to be normal size with normal function.  LVEF estimated by visual assessment was between 55-60%.  Cannot rule out wall motion abnormalities.* Normal right ventricular cavity size and systolic function.* No significant valve abnormalities on this limited study.US RenalResult Date: 1/8/2024RENAL ULTRASOUND WITH DOPPLER HISTORY:  aki. COMPARISON:   Ultrasound 11/13/2021 TECHNIQUE:  Multiple longitudinal and transverse images of the kidneys were performed using a high-resolution linear array transducer. FINDINGS: BLADDER:  The urinary bladder is incompletely distended. The prostate gland is not enlarged.  Bilateral ureteral jets are identified. RIGHT KIDNEY:  Right kidney measures 8.6 cm in length.  The renal echogenicity appears unremarkable. No stones, hydronephrosis or suspicious appearing mass lesions are identified. There are no perinephric collections. LEFT KIDNEY:  Left kidney measures 11.3  cm in length.  The  renal echogenicity appears unremarkable. No stones, hydronephrosis or suspicious appearing mass lesions are identified. There are no perinephric collections.  DOPPLER:    Renal vascular color flow is identified    Unremarkable appearing renal ultrasound Reported and signed by: Donivan Scull, MD  [facility] Radiology and Biomedical Imaging [place] ED Chest Abdomen Pelvis wo IV ContrastResult Date: 1/7/2024CT ED CHEST ABDOMEN PELVIS WO IV CONTRAST HISTORY/INDICATION: Dyspnea status post fall. COMPARISON: NONE TECHNIQUE: [place] images of the chest, abdomen, and pelvis were obtained from the thoracic inlet to the pubic symphysis without intravenous contrast. Coronal and sagittal multiplanar reformatted images of the chest, abdomen and pelvis were provided. FINDINGS: CLAVICLES & SCAPULAE: Unremarkable RIB CAGE & STERNUM: No acute osseous injury. MEDIASTINUM: Unremarkable. LUNGS/AIRWAYS/PLEURA: Evidence of the large airways disease/possible chronic aspiration including mild diffuse bronchial wall thickening, linear bibasilar scarring, and a small amount of endotracheal debris is seen just proximal to the origin of the right mainstem bronchus (image 193 series 4). VASCULATURE: The aorta and its major branches are unremarkable. LYMPH NODES: No enlarged lymph nodes. LIVER & BILIARY: Unremarkable. SPLEEN: Unremarkable. PANCREAS: Unremarkable. ADRENAL GLANDS: Unremarkable. KIDNEYS: Atrophic right kidney.Marland Kitchen Exophytic left renal cyst. BOWEL: Unremarkable. URINARY BLADDER: Unremarkable. PELVIC VISCERA: Unremarkable. VASCULATURE: IVC filter. THORACIC SPINE: No acute osseous injury. LUMBAR SPINE: No acute osseous injury. Chronic appearing grade 1 spondylolisthesis of L5 over S1. Chronic bilateral L5 pars defects. SACRUM & PELVIS: Status post right hip arthroplasty. Severe degenerative changes noted at the left hip. SOFT TISSUES: Bilateral gynecomastia.  Evidence of chronic large airways disease/aspiration including endotracheal debris, linear bibasilar scarring, and diffuse bronchial wall thickening. No evidence of acute abnormality within the chest, abdomen, or pelvis. [place] Radiology Notify System Classification: Routine. Report initiated by:  Vincenza Hews, MD Reported and signed by: Alberteen Sam, MD  [facility] Radiology and Biomedical Imaging US Duplex Lower Extremity Venous BilatResult Date: 1/7/2024US DUPLEX LOWER EXTREMITY VENOUS BILATERAL. HISTORY: Pain and swelling COMPARISON: None. TECHNIQUE: Grayscale, color and pulsed Doppler imaging were performed.  FINDINGS: There is symmetric phasicity in the lower external iliac veins. RIGHT LOWER EXTREMITY: COMMON FEMORAL VEIN: No thrombus. GREAT SAPHENOUS ORIGIN: No thrombus. UPPER PROFUNDA FEMORAL VEIN: No thrombus. FEMORAL VEIN: No thrombus. POPLITEAL VEIN: No thrombus. Appropriate augmentation to flow is noted in the popliteal vein. TIBIOPERONEAL TRUNK: No thrombus. POSTERIOR TIBIAL VEINS: Not well visualized. PERONEAL VEINS: Not well visualized.  LEFT LOWER EXTREMITY: COMMON FEMORAL VEIN: No thrombus. GREAT SAPHENOUS ORIGIN: No thrombus. UPPER PROFUNDA FEMORAL VEIN: No thrombus. FEMORAL VEIN: No thrombus. POPLITEAL VEIN: No thrombus. Appropriate augmentation to flow is noted in the popliteal vein. TIBIOPERONEAL TRUNK: Not well visualized. POSTERIOR TIBIAL VEINS: Not well visualized.   PERONEAL VEINS: Not well visualized.     No evidence of deep venous thrombosis in bilateral femoral/popliteal veins, within above limitations. Please note that ultrasound is less sensitive for evaluation of thrombus in calf veins. If there is continued clinical concern, re-evaluation can be performed after 5-7 days to evaluate for propagation of an unseen thrombus from the calf. [employer] Radiology Notify System Classification: Routine. Report initiated by:  Vincenza Hews, MD Reported and signed by: Alberteen Sam, MD  [facility] Radiology and Biomedical Imaging [place] Head Cervical Spine wo IV ContrastResult Date: 1/7/2024CT HEAD CERVICAL SPINE WO IV CONTRAST [place]) INDICATION: Fall with head strike COMPARISON: None TECHNIQUE: [place] images were obtained from the vertex through T1 without intravenous contrast. Coronal and sagittal multiplanar reformatted images were provided. FINDINGS: Brain: There is no intracranial hemorrhage, edema, mass, mass effect or midline  shift. There is no evidence of acute major vascular distribution infarct. Periventricular white matter hypodensity is nonspecific, though likely related to chronic small vessel ischemia. The ventricles and sulci are symmetrically enlarged, likely related to diffuse parenchymal volume loss.. The basal cisterns are patent. The paranasal sinuses and mastoid air cells are clear. The visualized orbits and osseous structures are unremarkable, noting left cataract surgery. Cervical Spine: There is no compression deformity or evidence for acute fracture or subluxation. The atlanto-occipital and atlanto-axial articulations are intact. Note is made of multilevel degenerative disc disease changes. 1. No evidence of acute intracranial abnormality.  2. No evidence for acute cervical spine fracture or traumatic subluxation. Please note that Noncontrast [place] is not sensitive for the detection of ischemic infarct. If ischemic infarct is of clinical concern, additional clinical or imaging evaluation is recommended. [place] Radiology Notify System Classification: Routine. Report initiated by:  Vincenza Hews, MD Reported and signed by: Lavera Guise, MD  [facility] Radiology and Biomedical Imaging CXRResult Date: 1/7/2024XR CHEST PA AND LATERAL INDICATION: fall, weakness COMPARISON: XR CHEST PA OR AP 2022-04-10 FINDINGS:   The cardiomediastinal silhouette is unchanged. Lungs are hypoinflated. No focal consolidation. Mild dependent atelectasis. The pleural spaces are clear. There is no acute osseous injury.  No acute cardiothoracic abnormality. [place] Radiology Notify System Classification: Routine. Reported and signed by: Alberteen Sam, MD  [facility] Radiology and Biomedical Imaging ECG/Tele Events: No ECG ordered todayI have reviewed the patient's ECG as resulted in the EMR.Assessment: 72 y.o. 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, gentle decompression with the Ace bandage, relieved and reapplied, c/o mild-to-moderate pain to left lower extremityIt is hard to believe that the patient lives home alone, with no supportPatient barely able to stand or ambulateEven though he prefers not to go to [facility], the patient may require short-term rehab?AKI remains, slow to improveElectrolyte derangement improvingPrincipal Problem:  Cellulitis  SNOMED [place](R): CELLULITIS  Active Problems:  AKI (acute kidney injury) (HC Code)  SNOMED [place](R): ACUTE INJURY OF KIDNEY    Hyponatremia  SNOMED [place](R): HYPONATREMIA    Hypomagnesemia  SNOMED [place](R): HYPOMAGNESEMIA    Hypokalemia  SNOMED [place](R): HYPOKALEMIA    Generalized weakness  SNOMED [place](R): ASTHENIA  Plan: Treatment plan with no significant change from previous day1.  Left lower extremity cellulitis/edema-?with improvement-? completed vancomycin+?Unasyn for 7 days-?appreciate id consult- transition to p.o. doxycycline?+?Augmentin-?follow fevers chills episode, follow daily CBC?#?AKI on CKD-?unit setting of poor p.o. intake?-?initial BUN/Cr?4.25/55, baseline SrCr 2-3- a.m. creatinine 3.42-?renal function slow to improve-?renal ultrasound with no acute finding-?nephrology consult is following patient-? continue gentle IVF NS with KCl at 50 mL#?electrolyte derangement,?hyponatremia, hypokalemia, hypomagnesemia-?likely due to poor p.o. intake?- K improving, Mag improving, hyponatremia remains-?continue IVF at 50 mL-?monitor electrolytes daily closely, watch fluid overload closely- calcium 6.9, with low albumin 1.4, corrected level > 8, will give 1 time dose calcium gluconate?#?acute hypertension-?the patient has initially presented with acute hypotension-?BP improving-?continue amlodipine-?continue hydralazine, continue metoprolol- the patient was on Coreg once, unsure of reason Coreg was discontinued, Coreg maybe a better BP control medication?HLD-?continue Crestor?Generalized weakness/lives home alone-?PT consult- the patient is able to stand  and ambulate with a walker, with the assistance.- reported independent at home, does not have a walker- reportedly the patient drove himself , and his car is at ED proximal- the patient is not interested in short-term rehab- given severe deconditioning, the patient may have to go to a short-term rehab?# constipation- resolved-  having BMs? Disposition- nearly ready for discharge- renal function need further improvement- may require short-term rehab, even though patient prefers not to, less likely patient can look after himself at homeThe patient is given update for his plan of careI have reviewed the consultants recommendations and/or discussed the plan of care with them directly. Care was discussed with the nursing and case managment teams. Patient/family was updated regarding illness, plan of care, and disposition plan. All orders were entered electronically. ?>40 minutes of time spent on chart review, counseling and/or coordinating care>50% of time spent on counseling/ direct care with patientSigned:Dink Creps Dion Body, APRN Beeper: [phone] or [email or web address]/15/20249:04 AM

## 2023-01-08 NOTE — Plan of Care
Problem: Adult Inpatient Plan of CareGoal: Plan of Care ReviewOutcome: Interventions implemented as appropriateGoal: Patient-Specific Goal (Individualized)Outcome: Interventions implemented as appropriateGoal: Absence of Hospital-Acquired Illness or InjuryOutcome: Interventions implemented as appropriateGoal: Optimal Comfort and WellbeingOutcome: Interventions implemented as appropriateGoal: Readiness for Transition of CareOutcome: Interventions implemented as appropriate Problem: Physical Therapy GoalsGoal: Physical Therapy GoalsDescription: Pt will complete bed mobility with modified (I) Pt will complete sit to stand transfers with RW and supervision Pt will ambulate 50 ft with RW and supervision Above goals appear unrealistic at this time, new goals (01/05/23)1. Patient will perform bed mobility with supervision2. Patient will perform transfers with supervision using appropriate assistive device 3. Patient will ambulate a minimum of 15 feet with supervision using appropriate assistive device Outcome: Interventions implemented as appropriate Problem: Skin Injury Risk IncreasedGoal: Skin Health and IntegrityOutcome: Interventions implemented as appropriate Problem: Fall Injury RiskGoal: Absence of Fall and Fall-Related InjuryOutcome: Interventions implemented as appropriate Plan of Care Overview/ Patient Status    Patient is alert and oriented X4. Denies CP, no telemetry orders. No SOB on room air, non-productive cough, PRN Guaifenesin X1 with good effect. Medications per MAR. OOB assist X1 with rolling walker. Continent. IVF continued. Able to make needs known and offers no complaints. Call bell within reach rounds performed safety maintained POC continued.

## 2023-01-08 NOTE — Progress Notes
[facility]	Nephrology Progress NoteAttending Provider: Merdis Delay, MD Assessment/Plan: Assessment  Marc Evans is a 72 year old male with a medical history of CKD 4, hypertension, hyperlipidemia, BPH with outflow obstruction, hx of bilateral nephrosis with bilateral nephrostomy tubes, s/p HOLEP surgery (05/18/21), history of UTIs with foley catheters, history of epididymoorchitis.  He came in for evaluation of weakness, not eating (but drinking) for three days.  Found to have AKI on CKD.  Left leg cellulitis.  Blood cultures 12/31/22 no growth to date.  ID consulting.?Echo 01/01/23 showed EF 55-60%; limited study; normal LV function and no valvular abnormalities.  Repeat CXR 01/03/23 showed mild fluid overload.AKI on CKD, non oliguricBaseline serum creatinine last November thought to be 2.5-3.0.  Etiology of CKD was thought to be related to obstructive nephropathy in 2021, relieved by bilateraly nephrostomy tubes, followed by HOLEP surgery in 05/18/21.  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  UAs 2021-2022 had moderate proteinuria.?Etiology of AKI possibly ATN iso infection vs prerenal/CRS.  [place] AP without iv contrast 12/31/22 shows no obstructive nephropathy.  UA 12/31/22 had 1+ protein, 1+ ketones, 2+ blood, few bacteria.  FeUrea 12/31/22 was 48.5% suggestive of intrinsic renal disease.  UPCR 11/27/22 had 0.940 g/mgCr.  UA 11/27/22 bland except for 1+ protein.?His sCr was 3.42 on 1/15, improved with IVF fluid.  BUN stable.    Urine output 1.8 L in last 24 hours.  He is on augmentin and doxycycline po.  His vancomycin random level was 14.9 on 01/07/23.?Recommendations/Plan:-OK to continue IVF NS (with 20 mEq KCL) at 50 mL/hr; low threshold to stop IVF with SOB increases or other signs of fluid overload; gentle hydration bc he is not taking in much by mouth-Continue to hold losartan-Would hold lasix for now; still making substantial urine-BMP daily-Strict I&O; measure urine output-Avoid nephrotoxins, including NSAIDS and iv contrast dye, if able-He has a hospital follow-up appointment in outpatient nephrology with Dr. Larna Daughters on 02/26/23 at 11:30 am in [facility]  Labs will need to be done prior to appt.Metabolic AcidosisModerate.  Serum CO2 17 on 01/08/23 vs 18-20 1/11-1/14/24.-Would start sodium bicarb 650 mg po BID-Monitor dailyHypokalemiaCorrected.  Likely due to poor oral intake.-Would continue KCL 40 mEq daily or OK to give IV-BMP daily  Hypocalcemia?Serum calcium 6.9-7.1 on 1/11-1/15/24.  Albumin 1.4 on 01/07/23.  Corrected calcium 9.0 mg/dL, within normal range.  Not eating very much at all.  Albumin trending down.-Encourage po intake of food-BMP dailyHyponatremiaMild.  Serum sodium 133 on 01/07/23.-BMP daily; monitor-Continue protein supplementsHypomagnesemiaCorrected.  Serum Mg 1.8 on 1/15.-Would give Mg Ox 400 mg po daily-Repeat Mg lab dailyAnemia of Chronic Renal DiseaseHgb 9.7 on 1/12-1/14/24.  Normocytic anemia.  Iron sat 17 on 01/03/23.-Would hold off IV iron due to infection-Continue po iron daily-CBC daily  Subjective: Subjective  Interim History:O/N no acute events.  Seen this morning, just finished PT (walking to and from door of room).C/o periodic cough.  Denies nausea, headache.  SBP 140-170s.  O2 sat 91-94% on room air.  IVF NS running at 50 mL/hour.Review of Allergies/Meds/Hx: I have reviewed the patient's: allergies and current scheduled medications Current Facility-Administered Medications Medication Dose Route Frequency Provider Last Rate Last Admin ? amLODIPine (NORVASC) tablet 10 mg  10 mg Oral Daily Sedaliu, Tomor, MD   10 mg at 01/08/23 0981 ? amoxicillin-clavulanate (AUGMENTIN) 500-125 mg tablet 500 mg  500 mg Oral 2 times daily Cindee Salt, MD   500 mg  at 01/08/23 1914 ? bisacodyL (DULCOLAX) EC tablet 10 mg  10 mg Oral Daily Peyton Bottoms, APRN   10 mg at 01/06/23 7829 ? calcium gluconate 1 g in sodium chloride 100 mL IVPB 1 g  1 g Intravenous Once Peyton Bottoms, APRN     ? doxycycline TAB/CAP 100 mg  100 mg Oral 2 times daily Peyton Bottoms, APRN   100 mg at 01/08/23 5621 ? ferrous sulfate (FEOSOL) tablet 325 mg  325 mg Oral BID WC Giuran-Benetato, Nena Polio, MD   325 mg at 01/08/23 0852 ? heparin (PORCINE) injection 5,000 Units  5,000 Units Subcutaneous Q8H Sedaliu, Tomor, MD   5,000 Units at 01/08/23 0524 ? hydrALAZINE (APRESOLINE) tablet 50 mg  50 mg Oral 3 times daily Peyton Bottoms, APRN   50 mg at 01/08/23 3086 ? metoprolol succinate XL (TOPROL-XL) 24 hr tablet 25 mg  25 mg Oral Daily Sedaliu, Tomor, MD   25 mg at 01/08/23 5784 ? rosuvastatin (CRESTOR) tablet 5 mg  5 mg Oral Daily Sedaliu, Tomor, MD   5 mg at 01/08/23 6962 ? senna (SENOKOT) tablet 17.2 mg  2 tablet Oral Nightly Peyton Bottoms, APRN     ? sodium chloride 0.9 % flush 3 mL  3 mL IV Push Q8H Sedaliu, Tomor, MD   3 mL at 01/07/23 1323 ? tiotropium bromide (SPIRIVA RESPIMAT) 2.5 mcg/actuation inhalation spray 2 Inhalation  2 Inhalation Inhalation Daily Gearldine Bienenstock, [place]   2 Inhalation at 01/08/23 [phone] ? sodium chloride 0.9% with 20 mEq KCl/L 50 mL/hr (01/08/23 0054) acetaminophen, albuterol **AND** tiotropium bromide, dextromethorphan-guaiFENesin, ondansetron **OR** ondansetron (ZOFRAN) IV Push, oxyCODONE, polyethylene glycol, sodium chloride  Objective  Objective: Vitals:I have reviewed the current vital signs Patient Vitals for the past 24 hrs: BP Temp Temp src Pulse Resp SpO2 01/08/23 0529 (!) 147/75 97.7 ?F (36.5 ?C) Oral 85 18 94 % 01/07/23 2210 (!) 157/71 97.7 ?F (36.5 ?C) Oral 85 20 94 % 01/07/23 2051 (!) 152/75 97.9 ?F (36.6 ?C) Oral 86 18 94 % 01/07/23 1436 (!) 141/75 97.8 ?F (36.6 ?C) Axillary (!) 92 18 94 % 01/07/23 1319 (!) 171/75 -- -- 78 18 -- I/O's:Gross Totals (Last 24 hours) at 01/08/2023 1103Last data filed at 01/08/2023 0529Intake 680 ml Output 1825 ml Net -1145 ml Physical Exam Constitutional:  Ill appearing, older male.  AAOX3.  Eyes  : Not pale, anicteric scleraeENT : moist oral mucosaNeck: No JVD notedCardiovascular: RRR S1 S2, no m/r/gChest: CTAB, diminished at bases.  No rales.  Work of breathing elevated, but just finished walking with PT.Abdominal: Large, BS hypo, soft, nt GU : [place] cathExtremities:  Bilateral LE edema trace on right, +2/3 on left, left leg red with cellulitic changesSkin : No rash on limited examNeurological:  Alert and oriented to person, place, and time. Psychiatric: Affect somewhat flatLabs:I have reviewed the current lab results Recent Labs Lab 01/11/240436 01/12/240632 01/13/241343 01/14/240514 01/15/240715 NA 131* 133* 135* 133* 133* K 3.2* 3.2* 3.5 4.0 3.8 CL 99 100 100 101 102 CO2 18* 20* 20* 19* 17* BUN 39* 38* 34* 38* 36* CREATININE 4.09* 4.04* 4.02* 3.59* 3.42* GLU 175* 157* 115* 116* 120* CALCIUM 6.9* 7.1* 7.0* 7.0* 6.9* MG 1.5* 1.5* 2.0 1.9 1.8 PHOS 3.2 2.9  --  3.5 4.3 Recent Labs Lab 01/11/240436 01/12/240632 01/14/240514 01/15/240715 WBC 13.2* 12.1* 9.4 9.4 HGB 10.1* 9.7* 9.7* 9.8* HCT 29.60* 28.00* 28.60* 28.90* PLT 182 251 303 335 Recent Labs Lab 01/08/241232 01/09/240513 01/10/240614 01/11/240012 ALKPHOS 70 80 89 112 BILITOT  0.6 0.5 0.4 0.3 BILIDIR 0.3 0.3 0.2  --  PROT 6.4 5.7* 5.7* 6.5 ALT 33 48 45 50 AST 41 69* 47* 58*  No results for input(s): PHUA, PROTEINUA, UROBILINOGEN in the last 168 hours.Invalid input(s): COLORUA, CLARITYUA, SPECGRAVUA, GLUCUA, KETONESUA, BLOODUA, NITRITEUA, LEUKOCYTESUA, BILIUA, WBC UA, RBC UA   Signed: Claude Manges, APRN [facility] Kidney CenterOffice: [phone] 15, 20249:07 AM

## 2023-01-08 NOTE — Plan of Care
Problem: Physical Therapy GoalsGoal: Physical Therapy GoalsDescription: Pt will complete bed mobility with modified (I) Pt will complete sit to stand transfers with RW and supervision Pt will ambulate 50 ft with RW and supervision Above goals appear unrealistic at this time, new goals (01/05/23)1. Patient will perform bed mobility with supervision2. Patient will perform transfers with supervision using appropriate assistive device 3. Patient will ambulate a minimum of 15 feet with supervision using appropriate assistive device  Outcome: Interventions implemented as appropriate Inpatient Physical Therapy Progress Note IP Adult PT Eval/Treat - 01/08/23 1122    Date of Visit / Treatment  Date of Visit / Treatment 01/08/23   1/4  Note Type Progress Note   Start Time 1052   End Time 1122   Total Treatment Time 30    General Information  Subjective Its just my leg.   General Observations Consulted RN, pt recieved in bed +IV, agreeable to PT   Precautions/Limitations Fall Precautions;Bed alarm    Vital Signs and Orthostatic Vital Signs  Vital Signs Free text SpO2: 96% on RA    Pain/Comfort  Pain Location - Side Left   Pain Location leg   Pain Rating with Activity 6/10    Patient Coping  Observed Emotional State accepting;calm;cooperative    Cognition  Level of Consciousness alert   Following Commands Follows one step commands without difficulty   Affect Flat    Skin Assessment  Skin Assessment See Nursing Documentation    Balance  Sitting Balance: Static  FAIR+     Maintains static position without assist or device, may require Supervision or Verbal Cues (>2 minutes)   Sitting Balance: Dynamic  FAIR      Performs dynamic activities through 75% range Contact Guard or partial range (50-75%) with Supervision   Standing Balance: Static FAIR       Maintains static position without assist or device, may require Supervision or Verbal Cues (<2 minutes)   Standing Balance: Dynamic  FAIR-     Performs dynamic activities through partial range (50-75%) with Contact Guard   Balance Assist Device Gait belt;Bariatric rolling walker    Bed Mobility  Supine-to-Sit Independence/Assistance Level Stand-by assist   Supine-to-Sit Assist Device Head of bed elevated   Sit-to-Supine Independence/Assistance Level Stand-by assist   Sit-to-Supine Assist Device Head of bed elevated   Bed Mobility, Impairments ROM decreased;strength decreased;pain    Sit-Stand Transfer Training  Sit-to-Stand Transfer Independence/Assistance Level Contact guard;Assist of 1;Verbal cues   Sit-to-Stand Transfer Assist Device Gait belt;Bariatric rolling walker   Stand-to-Sit Transfer Independence/Assistance Level Contact guard;Assist of 1;Verbal cues   Stand-to-Sit Transfer Assist Device Gait belt;Bariatric rolling walker   Transfer Safety Analysis Concerns cues for hand placement;decreased weight-shifting ability   Transfer Safety Analysis Impairments pain;decreased ROM;decreased strength;impaired balance   Sit-Stand Transfer Comments VCs for hand placement   Stand-Sit Transfer Comments VCs for hand placement    Gait Training  Symptoms Noted During/After Treatment  increased pain;fatigue   Independence/Assistance Level  Contact guard;Assist of 1   Assistive Device  Bariatric rolling walker;Gait belt   Gait Distance 25 feet;x2   seated rest after 25 ft x 5 minutes  Gait Pattern Analysis step to gait   progressing to step through  Gait Analysis Deviations decreased cadence;decreased step length;decreased stride length;decreased weight-shifting ability;antalgic gait   Gait Analysis Impairments impaired balance;pain;decreased ROM;decreased endurance/activity tolerance;decreased strength;decreased flexibility   Ambulation distance was limited by: pain;impaired motor function;fatigue    Stair Performance Independence/Assistance Level  Not tested  Handoff Documentation  Handoff Patient in bed;Bed alarm;Patient instructed to call nursing for mobility;Discussed with nursing    Endurance  Endurance Comments Limited by pain and fatigue in LLE    PT- AM-PAC - Basic Mobility Screen- How much help from another person do you currently need.....  Turning from your back to your side while in a a flat bed without using rails? 3 - A Little - Requires a little help (supervision, minimal assistance). Can use assistive devices.   Moving from lying on your back to sitting on the side of a flat bed without using bed rails? 3 - A Little - Requires a little help (supervision, minimal assistance). Can use assistive devices.   Moving to and from a bed to a chair (including a wheelchair)? 3 - A Little - Requires a little help (supervision, minimal assistance). Can use assistive devices.   Standing up from a chair using your arms(e.g., wheelchair or bedside chair)? 3 - A Little - Requires a little help (supervision, minimal assistance). Can use assistive devices.   To walk in a hospital room? 3 - A Little - Requires a little help (supervision, minimal assistance). Can use assistive devices.   Climbing 3-5 steps with a railing? 1 - Total - Requires total assistance or cannot do it at all.   AMPAC Mobility Score 16   TARGET Highest Level of Mobility Mobility Level 5, Stand for 1 minute   ACTUAL Highest Level of Mobility Mobility Level 7, Walk 25+ feet    Therapeutic Exercise  Therapeutic Exercise Comments Performed b/l ankle pumps and left heel slides x 5    Therapeutic Functional Activity  Therapeutic Functional Activity Comments Educated pt on role of PT in acute care, goals with PT, progress with PT, PT POC, safety/sequencing with mobility and DC planning    Clinical Impression  Follow up Assessment Tolerated treatment fairly well with good overall effort. The pt is making good progress. Able to begin gait training and ambulate 25 ft x 2 trials. Less assist for bed mobility. Remains limited due to LLE pain, decreased strength, balance, ROM, activity tolerance and endurance. The pt will continue to benefit from skilled PT services to address deficits   Criteria for Skilled Therapeutic Interventions Met yes;treatment indicated   Rehab Potential good, to achieve stated therapy goals    Patient/Family Stated Goals  Patient/Family Stated Goal(s) return home;decrease pain;get stronger;feel better    Frequency/Equipment Recommendations  What day of week is next treatment expected? Tuesday    Planned Treatment / Interventions  Plan for Next Visit Progress mobility as tolerated    PT Discharge Summary  Physical Therapy Disposition Recommendation Inpatient Rehab   Equipment Recommendations for Discharge To be determined pending progress     Donny Pique, PTA

## 2023-01-09 ENCOUNTER — Telehealth: Payer: Self-pay | Admitting: Cardiovascular Disease

## 2023-01-09 LAB — BASIC METABOLIC PANEL
BKR ANION GAP: 15 (ref 5–16)
BKR BLOOD UREA NITROGEN: 33 mg/dL — ABNORMAL HIGH (ref 7–18)
BKR BUN / CREAT RATIO: 9.2 (ref 8.0–23.0)
BKR CALCIUM: 7.5 mg/dL — ABNORMAL LOW (ref 8.5–10.5)
BKR CHLORIDE: 103 mmol/L (ref 98–107)
BKR CO2: 16 mmol/L — ABNORMAL LOW (ref 21–32)
BKR CREATININE: 3.57 mg/dL — ABNORMAL HIGH (ref 0.80–1.30)
BKR EGFR, CREATININE (CKD-EPI 2021): 18 mL/min/{1.73_m2} — ABNORMAL LOW (ref >=60)
BKR GLUCOSE: 120 mg/dL — ABNORMAL HIGH (ref 70–100)
BKR POTASSIUM: 3.8 mmol/L (ref 3.5–5.1)
BKR SODIUM: 134 mmol/L — ABNORMAL LOW (ref 136–145)

## 2023-01-09 LAB — CBC WITH AUTO DIFFERENTIAL
BKR WAM ABSOLUTE IMMATURE GRANULOCYTES.: 0.1 x 1000/ÂµL (ref 0.00–0.30)
BKR WAM ABSOLUTE LYMPHOCYTE COUNT.: 1.03 x 1000/??L (ref 0.60–3.70)
BKR WAM ABSOLUTE NRBC (2 DEC): 0 x 1000/??L — ABNORMAL LOW (ref 0.00–1.00)
BKR WAM ANALYZER ANC: 6.9 x 1000/??L (ref 2.00–7.60)
BKR WAM BASOPHIL ABSOLUTE COUNT.: 0.04 x 1000/??L — ABNORMAL HIGH (ref 0.00–1.00)
BKR WAM BASOPHILS: 0.5 % (ref 0.0–1.4)
BKR WAM EOSINOPHIL ABSOLUTE COUNT.: 0.12 x 1000/??L (ref 0.00–1.00)
BKR WAM EOSINOPHILS: 1.4 % (ref 0.0–5.0)
BKR WAM HEMATOCRIT (2 DEC): 28.7 % — ABNORMAL LOW (ref 38.50–50.00)
BKR WAM HEMOGLOBIN: 9.5 g/dL — ABNORMAL LOW (ref 13.2–17.1)
BKR WAM IMMATURE GRANULOCYTES: 1.1 % — ABNORMAL HIGH (ref 0.0–1.0)
BKR WAM LYMPHOCYTES: 11.8 % — ABNORMAL LOW (ref 17.0–50.0)
BKR WAM MCH (PG): 30.6 pg (ref 27.0–33.0)
BKR WAM MCHC: 33.1 g/dL (ref 31.0–36.0)
BKR WAM MCV: 92.6 fL (ref 80.0–100.0)
BKR WAM MONOCYTE ABSOLUTE COUNT.: 0.56 x 1000/??L (ref 0.00–1.00)
BKR WAM MONOCYTES: 6.4 % (ref 4.0–12.0)
BKR WAM MPV: 10.6 fL (ref 8.0–12.0)
BKR WAM NEUTROPHILS: 78.8 % — ABNORMAL HIGH (ref 39.0–72.0)
BKR WAM NUCLEATED RED BLOOD CELLS: 0 % (ref 0.0–1.0)
BKR WAM PLATELETS: 339 x1000/ÂµL (ref 150–420)
BKR WAM RDW-CV: 12.3 % (ref 11.0–15.0)
BKR WAM RED BLOOD CELL COUNT.: 3.1 M/??L — ABNORMAL LOW (ref 4.00–6.00)
BKR WAM WHITE BLOOD CELL COUNT: 8.8 x1000/??L (ref 4.0–11.0)

## 2023-01-09 LAB — PHOSPHORUS     (BH GH L LMW YH): BKR PHOSPHORUS: 4.4 mg/dL (ref 2.5–4.9)

## 2023-01-09 LAB — ALBUMIN: BKR ALBUMIN: 1.5 g/dL — ABNORMAL LOW (ref 3.4–5.0)

## 2023-01-09 LAB — MAGNESIUM: BKR MAGNESIUM: 1.6 mg/dL — ABNORMAL LOW (ref 1.8–2.5)

## 2023-01-09 MED ORDER — OXYCODONE IMMEDIATE RELEASE 5 MG TABLET
5 mg | Freq: Four times a day (QID) | ORAL | Status: DC | PRN
Start: 2023-01-09 — End: 2023-01-11
  Administered 2023-01-10: 04:00:00 5 mg via ORAL

## 2023-01-09 MED ORDER — MAGNESIUM OXIDE 400 MG (241.3 MG MAGNESIUM) TABLET
400241.3 mg (241.3 mg magnesium) | Freq: Once | ORAL | Status: CP
Start: 2023-01-09 — End: ?
  Administered 2023-01-09: 17:00:00 400 mg (241.3 mg magnesium) via ORAL

## 2023-01-09 MED ORDER — SODIUM BICARBONATE 325 MG TABLET
325 mg | Freq: Two times a day (BID) | ORAL | Status: DC
Start: 2023-01-09 — End: 2023-01-11
  Administered 2023-01-10 (×2): 325 mg via ORAL

## 2023-01-09 NOTE — Telephone Encounter (Signed)
Pt called stating he recently started jardiance in Jan and since has noticed his BP is decreasing. Pt denies feeling lightheaded or dizzy and reports an occasional headache.   117/72 113/65 115/60 120/66 120/69 121/70 111/66 93/56  Nurse informed pt BP readings are wnl, however pt is concerned about recent BP reading of 93/56.  Will forward to MD for recommendations.

## 2023-01-09 NOTE — Telephone Encounter (Signed)
Pt c/o medication issue:  1. Name of Medication:  empagliflozin (JARDIANCE) 10 MG TABS tablet  2. How are you currently taking this medication (dosage and times per day)? As written  3. Are you having a reaction (difficulty breathing--STAT)? No   4. What is your medication issue? Medication is causing patient's BP to go low

## 2023-01-09 NOTE — Plan of Care
Plan of Care Overview/ Patient Status   Hospital day #9Met with pt and discussed discharge plans. PT will re-eval him tomorrow. Pt has no DME at home. Pt states he has enough food in his home. Discussed home care services with him and he had no preferenced, referrals were placed. Carolynne Edouard RN Case Manager [facility] accepted. Pt has his car in the parking lot and will drive himself home tomorrow.

## 2023-01-09 NOTE — Plan of Care
Problem: Adult Inpatient Plan of CareGoal: Plan of Care ReviewOutcome: Interventions implemented as appropriateGoal: Patient-Specific Goal (Individualized)Outcome: Interventions implemented as appropriateGoal: Absence of Hospital-Acquired Illness or InjuryOutcome: Interventions implemented as appropriateGoal: Optimal Comfort and WellbeingOutcome: Interventions implemented as appropriateGoal: Readiness for Transition of CareOutcome: Interventions implemented as appropriate Problem: Skin Injury Risk IncreasedGoal: Skin Health and IntegrityOutcome: Interventions implemented as appropriate Problem: Fall Injury RiskGoal: Absence of Fall and Fall-Related InjuryOutcome: Interventions implemented as appropriate Plan of Care Overview/ Patient Status    Assumed care at 0700. Alert and oriented x 4. VS as documented. Afebrile. Denies chest pain. RA.  Sats WNL. Congested cough noted. Requested and received cough medicine re same. No acute resp distress noted. Magnesium 1.6. Repleted. Other medications per MAR. IVF infusing per orders. LLE edema persists. Continent of B&B. Uses urinal. OOB with PT this AM. Tolerated well. Awaiting OT eval. BRP with A/1 and RW. Safety precautions maintained. Continue POC. 3:30 PMPatient is likely discharge in AM. IVF discontinued. Patient on strict I&O. LLE remains swollen / red. No drainage noted.. Ace bandage changed.

## 2023-01-09 NOTE — Progress Notes
[facility] Progress NoteSubjective: 72yo male seen today for follow up. Patient reports he would like to be discharge home soon. Tells me he is not interested in STR. Feels his oral intake has been adequate.Notes, labs and vitals reviewed. No overnight events. Review of Allergies/Meds/Hx: I have reviewed the patient's: allergies, current scheduled medications, current infusions, current prn medications, past medical history, past surgical history, social history, and prior to admission medicationsObjective: Vitals:I have reviewed the patient's current vital signs as documented in the patient's EMR.   and Last 24 hours: Temp:  [97.3 ?F (36.3 ?C)-98.2 ?F (36.8 ?C)] 97.3 ?F (36.3 ?C)Pulse:  [77-90] 87Resp:  [18-20] 18BP: (145-159)/(64-78) 157/64SpO2:  [93 %-96 %] 96 %I/O's:I have reviewed the patient's current I&O's as documented in the EMR.Gross Totals (Last 24 hours) at 01/09/2023 1757Last data filed at 01/09/2023 1600Intake 360 ml Output 2175 ml Net -1815 ml Procedures:None Physical Exam Constitutional: Elderly male laying comfortably in bed, NADHEENT: Head:  Normocephalic, atraumatic. Eyes: EOMI, sclera white, conjunctiva pink. Mouth/Throat: Mucous membranes pink and moist. Cardiovascular: RRR, S1/S2 present, no murmurPulmonary/Chest: Lungs CTA bilaterally, no increased WOB. No wheezing, no rales or rhonchi noted Gl: Soft,nondistended  Non tender to palpation, no gaurding or mass. Positive BS in 4 quadrantsGU: No suprapubic tenderness or distensionMusculoskeletal:  Calves soft and non tender, moves all extremities, no edemaNeurological:awake, alert and oriented to person, place, no focal deficits notedIntegumentary: Skin is warm, dry and intact. No rash noted. Psychiatric: normal mood and affectLabs:I have reviewed the patient's labs within the last 24 hrs.Last 24 hours: Recent Results (from the past 24 hour(s)) Magnesium  Collection Time: 01/09/23  6:31 AM Result Value Ref Range  Magnesium 1.6 (L) 1.8 - 2.5 mg/dL Phosphorus  Collection Time: 01/09/23  6:31 AM Result Value Ref Range  Phosphorus 4.4 2.5 - 4.9 mg/dL Basic metabolic panel  Collection Time: 01/09/23  6:31 AM Result Value Ref Range  Sodium 134 (L) 136 - 145 mmol/L  Potassium 3.8 3.5 - 5.1 mmol/L  Chloride 103 98 - 107 mmol/L  CO2 16 (L) 21 - 32 mmol/L  Anion Gap 15 5 - 16  Glucose 120 (H) 70 - 100 mg/dL  BUN 33 (H) 7 - 18 mg/dL  Creatinine 1.61 (H) 0.96 - 1.30 mg/dL  Calcium 7.5 (L) 8.5 - 10.5 mg/dL  BUN/Creatinine Ratio 9.2 8.0 - 23.0  eGFR (Creatinine) 18 (L) >=60 mL/min/1.51m2 CBC auto differential  Collection Time: 01/09/23  6:31 AM Result Value Ref Range  WBC 8.8 4.0 - 11.0 x1000/?L  RBC 3.10 (L) 4.00 - 6.00 M/?L  Hemoglobin 9.5 (L) 13.2 - 17.1 g/dL  Hematocrit 04.54 (L) 09.81 - 50.00 %  MCV 92.6 80.0 - 100.0 fL  MCH 30.6 27.0 - 33.0 pg  MCHC 33.1 31.0 - 36.0 g/dL  RDW-CV 19.1 47.8 - 29.5 %  Platelets 339 150 - 420 x1000/?L  MPV 10.6 8.0 - 12.0 fL  Neutrophils 78.8 (H) 39.0 - 72.0 %  Lymphocytes 11.8 (L) 17.0 - 50.0 %  Monocytes 6.4 4.0 - 12.0 %  Eosinophils 1.4 0.0 - 5.0 %  Basophil 0.5 0.0 - 1.4 %  Immature Granulocytes 1.1 (H) 0.0 - 1.0 %  nRBC 0.0 0.0 - 1.0 %  ANC(Abs Neutrophil Count) 6.90 2.00 - 7.60 x 1000/?L  Absolute Lymphocyte Count 1.03 0.60 - 3.70 x 1000/?L  Monocyte Absolute Count 0.56 0.00 - 1.00 x 1000/?L  Eosinophil Absolute Count 0.12 0.00 - 1.00 x 1000/?L  Basophil Absolute Count 0.04 0.00 - 1.00 x 1000/?L  Absolute Immature Granulocyte Count 0.10 0.00 - 0.30 x 1000/?L  Absolute nRBC 0.00 0.00 - 1.00 x 1000/?L Albumin  Collection Time: 01/09/23  6:31 AM Result Value Ref Range  Albumin 1.5 (L) 3.4 - 5.0 g/dL Diagnostics:I have reviewed the patient's Radiology report(s) within the last 48 hrs. XR Chest PA or APResult Date: 01/03/2023 Mild fluid overload. Reported and signed by: Maudie Flakes, MD  [facility] Radiology and Biomedical Imaging US RenalResult Date: 01/01/2023   Unremarkable appearing renal ultrasound Reported and signed by: Donivan Scull, MD  [facility] Radiology and Biomedical Imaging [place] ED Chest Abdomen Pelvis wo IV ContrastResult Date: 12/31/2022 Evidence of chronic large airways disease/aspiration including endotracheal debris, linear bibasilar scarring, and diffuse bronchial wall thickening. No evidence of acute abnormality within the chest, abdomen, or pelvis. [place] Rad[facility] Notify System Classification: Routine. Report initiated by:  Vincenza Hews, MD Reported and signed by: Alberteen Sam, MD  [facility] Radiology and Biomedical Imaging US Duplex Lower Extremity Venous BilatResult Date: 12/31/2022 No evidence of deep venous thrombosis in bilateral femoral/popliteal veins, within above limitations. Please note that ultrasound is less sensitive for evaluation of thrombus in calf veins. If there is continued clinical concern, re-evaluation can be performed after 5-7 days to evaluate for propagation of an unseen thrombus from the calf. [place] Rad[facility] Notify System Classification: Routine. Report initiated by:  Vincenza Hews, MD Reported and signed by: Alberteen Sam, MD  [facility] Radiology and Biomedical Imaging [place] Head Cervical Spine wo IV ContrastResult Date: 1/7/20241. No evidence of acute intracranial abnormality.  2. No evidence for acute cervical spine fracture or traumatic subluxation. Please note that Noncontrast Head [place] is not sensitive for the detection of ischemic infarct. If ischemic infarct is of clinical concern, additional clinical or imaging evaluation is recommended. [place] Radiology Notify System Classification: Routine. Report initiated by:  Vincenza Hews, MD Reported and signed by: Lavera Guise, MD  [facility] Radiology and Biomedical Imaging CXRResult Date: 12/31/2022 No acute cardiothoracic abnormality. [place] Radiology Notify System Classification: Routine. Reported and signed by: Alberteen Sam, MD  [facility] Radiology and Biomedical Imaging  ECG/Tele Events: I have reviewed the patient's ECG as resulted in the EMR.Assessment: This is a 72yo male with hx of HTN, HLD, CKD who presented to [facility] ED on 1/7 with complaint of weakness for 3 days, diarrhea, reduced oral intake. It was noted that patient had fallen while in the parking lot and helped into the ED in a wheel chair. In the ED he was hypertensive 202/139, improving 133/80.  Chest x-ray was negative [place] head C-spine negative, [place] chest abdomen pelvis showed chronic large airway disease, Dopplers were negative, sodium 129 creatinine 4.25 baseline around 2-3, troponin 34, 35, lactate negative, white count 12, platelets 113.  UA was negative.  Patient found to have cellulitis of Left leg.Overall Impression: Patient admitted with LLE cellulitis, AKI on CKDPlan: LLE cellulitis-gradually improving-significant leg edema suspected to be contributing-ID following - transition to doxycycline and augmentin to complete 14 days course of treatmentAKI on CKD-Cr gradually improved from 4.25 to 3.57-baseline Cr appears to be 2.7-3-nephrology following - [place] shows no obstructive nephropathy. AKI possibly ATN iso infection vs prerenal/CRS. Recommend stopping IVF for consideration of d/c tomorrow, cont to hold losartan, hold lasix, has scheduled outpatient f/u with Dr. Larna Daughters on 02/26/23-cont bicarb tabsHyponatremia-mild, possibly in setting of poor oral intake-urine sodium noted to be <50 suggestive of hypovolemiaHypomagnesemia-oral mag replacement given todayHypokalemia-improvingHTN-cont amlodipine-cont hydralazine-cont metoprololHLD-cont statinGeneralized weakness-PT initially recommended STR, now working towards home PT-PT to reassess tomorrow-OT consult orderedPoor  oral intake, hypoalbuminemia-albumin trending down, today slight improvement to 1.5-some improvement in oral intake-case mgmt to ensure patient has home food deliver system if he continues to refuse STRCode Status: No CodeDVT ppx: HeparinDiet: CardiacActivity: As toleratedDispo: Not medically readyI have personally reviewed the patient's Electronic medical record, vitals signs, allergies, current medication list, laboratory data, microbiology/pathology, imaging studies and EKG. Consultant notes reviewed.  More than 35 minutes spent on caring for patient>50% of time spent on chart review, counseling and/or coordinating care.Signed:Ronith Berti Caren Griffins, [place] Beeper: [facility] 01/09/2023

## 2023-01-09 NOTE — Plan of Care
Plan of Care Overview/ Patient Status    CONSTITUTIONAL: no fever, no appetite change, no weight loss MENTAL:AOx4, pleasant and cooperativeRESPIRATORY: Denies SOB,lungs wheezing on expiration, RA, IS encouraged, complains of cough nonproductiveCARDIAC: S1S2, no murmurs, denies chest pain, GASTRO: Tolerating  diet, IVF, adequate PO intake, LBM1/15/23, +BSUROLOGY: CYU continent, no frequency and no pain on urinationMUSCULOSKELETAL: +FP,CMS,BLE numbness no tingling, ambulating RWx1NEUROS: PEERLA, intactSKIN: intact, LLE cellulitis wrapped edema+4, RLE +2 edema turned and repositioned q2hrsSAFETY: Safety maintained call bell and belongings within reach. Bed alarm in placeEDUCATION: Education provided on current medication regimen. Patient able to teach back.  Miro Balderson Darrelyn Hillock, RN

## 2023-01-09 NOTE — Plan of Care
[name]				Location: [facility] MEM [phone] y.o., male				Attending: Merdis Delay, MD	Admit Date: 12/31/2022			[id number] LOS: 9 days Past Medical History: Diagnosis Date ? High blood pressure  ? Hypertension  Past Surgical History: Procedure Laterality Date ? TONSILLECTOMY    as a child ? 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  Medication:I have reviewed the patient's current meds as listed in the patient's EMR.Nutritional AssessmentTimepoint: ReassessmentReason For Assessment: nurse/nurse practitioner consultIdentified At Risk by Screening Criteria: no indicators presentCurrent StateCurrent Appetite: good (75-100% of most meals per nursing records)Specialty Diet/Nutrition Received: cardiac, low saltDiet/Feeding Assistance: noneDiet/Feeding Tolerance: goodNutrition Interventions: food preferences provided, supplemental drinks providedNutrition Risk Screen: no indicators presentAnthropometric MeasurementsHeight: 5' 11 (180.3 cm) Weight: 112.6 kg Weight Method: Actual Weight Scale Used: Bed BMI (Calculated): 34.6IBW/kg (Calculated) : 78.2Usual weight: 230#/104.55kg per patientAdmit Weight: 112.6 kgWeight Change: +3/+4 b/l LE edemaNutrition Focused Physical FindingsOverall Physical Appearance: obeseSkin:  no pressure injuries per nursing skin assessmentEstimated Nutritional NeedsTotal Energy Estimated Needs: 1573-2202gmMethod for Estimating Needs: MSJ x 1.0-1.4Total Protein Estimated Needs: 78-94gmMethod for Estimating Needs: 1-1.2gm/kg 78.18kgTotal Fluid Estimated Needs: 1573-222ml or per MD discretionMethod for Estimating Needs: 63ml/kcalNutrition OrderNutrition Order: meets nutritional requirementsNutrition Order Comments: 3 gram Na, cardiacNutrition RiskDate of Last RD visit: 01/16/24Date of next RD visit: 01/23/24Nutrition Risk: patient at nutritional riskLevel of Risk: moderateNutrition DiagnosisNutrition Diagnosis/Problem: Altered nutrition related lab valuesRelated to: acute illnessAs Evidenced by: Na 133, K 3.2, Mg 1.5Nutrition RecommendationsMeal and Snacks:  3 gram Na, cardiacMedical Food Supplements: Commercial beveragefood preferences provided, supplemental drinks providedAssessment/PlanNutrition Consult ordered due to poor po intake/low albumin. Patient was seen by RD on 1/13; Ensure high protein added daily. Spoke with patient at bedside; reports he has not tried the Ensure supplement. Will try drinking it later today. Reports good appetite. Per nursing records patient consuming 75-100% of most meals over past several days. Encouraged continued good po intake. No new weight to assess. Labs reviewed. Na: 134; Mag: 1.6; K+: 3.8; received Mag supplementation today. BUN/Creat are increased; being followed by Renal. No pressure injuries per nursing skin assessment.Recommendations/Interventions:?	Continue current diet as ordered?	Encourage PO, provide food preferences?	Ensure high protein daily?	Request weekly weightsGoals: ?	Patient will consume >75% of needs?	Patient will consume >75% of nutrition supplement?	Patient will preserve lean body mass and skin integrityMonitoring/Evaluation:Will monitor 	Weight and weight change	Diet Order and Food Intake	Glucose profile/Labs	Gastrointestinal	SkinElectronically signed by Karie Fetch, RD, CD-N, CNSC on January 09, 2023

## 2023-01-09 NOTE — Plan of Care
Problem: Physical Therapy GoalsGoal: Physical Therapy GoalsDescription: Pt will complete bed mobility with modified (I) Pt will complete sit to stand transfers with RW and supervision Pt will ambulate 50 ft with RW and supervision Above goals appear unrealistic at this time, new goals (01/05/23)1. Patient will perform bed mobility with supervision2. Patient will perform transfers with supervision using appropriate assistive device 3. Patient will ambulate a minimum of 15 feet with supervision using appropriate assistive device  Outcome: Interventions implemented as appropriate Inpatient Physical Therapy Progress Note IP Adult PT Eval/Treat - 01/09/23 1102    Date of Visit / Treatment  Date of Visit / Treatment 01/09/23   2/4  Note Type Progress Note   Start Time 1038   End Time 1102   Total Treatment Time 24    General Information  Subjective Have you heard anything about me going home yet?   General Observations Consulted RN, pt recieved in bed +IV, agreeable to PT   Precautions/Limitations Fall Precautions;Bed alarm    Vital Signs and Orthostatic Vital Signs  Vital Signs Free text SpO2: 96% on RA    Pain/Comfort  Pain Location - Side Left   Pain Location - Orientation lower   Pain Location leg   Pain Comment (Pre/Post Treatment Pain) Not rated    Patient Coping  Observed Emotional State accepting;calm;cooperative    Cognition  Level of Consciousness alert    Skin Assessment  Skin Assessment See Nursing Documentation    Balance  Sitting Balance: Static  FAIR+     Maintains static position without assist or device, may require Supervision or Verbal Cues (>2 minutes)   Sitting Balance: Dynamic  FAIR+    Performs dynamic activities through full range with Supervision   Standing Balance: Static FAIR       Maintains static position without assist or device, may require Supervision or Verbal Cues (<2 minutes) Standing Balance: Dynamic  FAIR      Performs dynamic activities through 75% range Contact Guard or partial range (50-75%) with Supervision   Balance Assist Device Gait belt;Bariatric rolling walker    Bed Mobility  Supine-to-Sit Independence/Assistance Level Stand-by assist   Supine-to-Sit Assist Device Head of bed elevated   Sit-to-Supine Independence/Assistance Level Stand-by assist   Sit-to-Supine Assist Device Head of bed elevated   Bed Mobility, Impairments ROM decreased;strength decreased;pain    Sit-Stand Transfer Training  Sit-to-Stand Transfer Independence/Assistance Level Stand-by assist;Assist of 1;Verbal cues   Sit-to-Stand Transfer Assist Device Gait belt;Bariatric rolling walker   Stand-to-Sit Transfer Independence/Assistance Level Stand-by assist;Assist of 1;Verbal cues   Stand-to-Sit Transfer Assist Device Gait belt;Bariatric rolling walker   Transfer Safety Analysis Concerns cues for hand placement;decreased weight-shifting ability   Transfer Safety Analysis Impairments pain;decreased ROM;decreased strength;impaired balance   Sit-Stand Transfer Comments VCs for hand placement   Stand-Sit Transfer Comments VCs for hand placement    Gait Training  Symptoms Noted During/After Treatment  shortness of breath;fatigue   Independence/Assistance Level  Stand-by assist;Assist of 1   Assistive Device  Bariatric rolling walker;Gait belt   Gait Distance 75 feet;x2   Seated rest after 75 ft x 4-5 minutes  Gait Pattern Analysis step through gait   Gait Analysis Deviations increased cadence;decreased weight-shifting ability;antalgic gait   Gait Analysis Impairments impaired balance;pain;decreased ROM;decreased endurance/activity tolerance;decreased strength;decreased flexibility   Gait Training Comments VCs for slightly slow cadence, no LOB observed    Stair Performance  Independence/Assistance Level  Not tested    Handoff Documentation Handoff Patient in bed;Bed alarm;Patient instructed to  call nursing for mobility;Discussed with nursing    Endurance  Endurance Comments Fair/Fair-    PT- AM-PAC - Basic Mobility Screen- How much help from another person do you currently need.....  Turning from your back to your side while in a a flat bed without using rails? 3 - A Little - Requires a little help (supervision, minimal assistance). Can use assistive devices.   Moving from lying on your back to sitting on the side of a flat bed without using bed rails? 3 - A Little - Requires a little help (supervision, minimal assistance). Can use assistive devices.   Moving to and from a bed to a chair (including a wheelchair)? 3 - A Little - Requires a little help (supervision, minimal assistance). Can use assistive devices.   Standing up from a chair using your arms(e.g., wheelchair or bedside chair)? 3 - A Little - Requires a little help (supervision, minimal assistance). Can use assistive devices.   To walk in a hospital room? 3 - A Little - Requires a little help (supervision, minimal assistance). Can use assistive devices.   Climbing 3-5 steps with a railing? 2 - A Lot - Requires a lot of help (maximum to moderate assistance). Can use assistive devices.   AMPAC Mobility Score 17   TARGET Highest Level of Mobility Mobility Level 5, Stand for 1 minute   ACTUAL Highest Level of Mobility Mobility Level 7, Walk 25+ feet    Therapeutic Exercise  Therapeutic Exercise Comments Encouraged to perform b/l LE ther-ex as tolerated in bed    Therapeutic Functional Activity  Therapeutic Functional Activity Comments Educated pt on role of PT in acute care, goals with PT, PT POC, progress with PT, safety with mobility and DC planning. Encouraged pt to ambulate with nsg as tolerated.    Clinical Impression  Follow up Assessment Tolerated treatment fairly well with god overall effort. Motivated for PT and to go home. The pt is making steady progress, able to increase his gait distance with improving activity tolerance. Continues to demo DOE, stable O2 sats on RA. Remains limited due to pain, decreased strength, balance, ROM, activity tolerance and endurance. The pt will continue to benefit form skilled PT services to address deficits. Updated PA after session.   Criteria for Skilled Therapeutic Interventions Met yes;treatment indicated   Rehab Potential good, to achieve stated therapy goals    Patient/Family Stated Goals  Patient/Family Stated Goal(s) return home;decrease pain;get stronger;feel better    Frequency/Equipment Recommendations  What day of week is next treatment expected? Wednesday    PT Recommendations for Inpatient Admission  Activity/Level of Assist out of bed;ambulate;assist of 1;with rolling walker;gait belt    Planned Treatment / Interventions  Plan for Next Visit Trial stairs as tolerated    PT Discharge Summary  Physical Therapy Disposition Recommendation To be determined pending progress   Additional Physical Therapy Disposition Recommendations Working towards home;Home Physical Therapy   Disposition Recommendations (free text) Due to recent progress, pt may be able to be DCed home with home PT and RW pending further progress with mobility and stairs. Will need at least one more PT session   Equipment Recommendations for Discharge Rolling walker     Donny Pique, PTA

## 2023-01-09 NOTE — Progress Notes
[facility]	Nephrology Progress NoteAttending Provider: Merdis Delay, MD Assessment/Plan: Assessment  Marc Evans is a 72 year old male with a medical history of CKD 4, hypertension, hyperlipidemia, BPH with outflow obstruction, hx of bilateral nephrosis with bilateral nephrostomy tubes, s/p HOLEP surgery (05/18/21), history of UTIs with foley catheters, history of epididymoorchitis.  He came in for evaluation of weakness, not eating (but drinking) for three days.  Found to have AKI on CKD.  Left leg cellulitis.  Blood cultures 12/31/22 no growth to date.  ID consulting.?Echo 01/01/23 showed EF 55-60%; limited study; normal LV function and no valvular abnormalities.  Repeat CXR 01/03/23 showed mild fluid overload.AKI on CKD, non oliguricBaseline serum creatinine last November thought to be 2.5-3.0.  Etiology of CKD was thought to be related to obstructive nephropathy in 2021, relieved by bilateraly nephrostomy tubes, followed by HOLEP surgery in 05/18/21.  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  UAs 2021-2022 had moderate proteinuria.?Etiology of AKI possibly ATN iso infection vs prerenal/CRS.  [place] AP without iv contrast 12/31/22 shows no obstructive nephropathy.  UA 12/31/22 had 1+ protein, 1+ ketones, 2+ blood, few bacteria.  FeUrea 12/31/22 was 48.5% suggestive of intrinsic renal disease.  UPCR 11/27/22 had 0.940 g/mgCr.  UA 11/27/22 bland except for 1+ protein.?His sCr was 3.57 on 1/16, stable.  BUN stable.    Urine output 1.9 L in last 24 hours.?Recommendations/Plan:-Would stop IVF;  Assess off IVF prior to discharge tomorrow-Continue to hold losartan-Encourage po intake; would give him nepro-Would hold lasix for now; smaking substantial urine-BMP daily-Strict I&O; measure urine output-Avoid nephrotoxins, including NSAIDS and iv contrast dye, if able-He has a hospital follow-up appointment in outpatient nephrology with Dr. Larna Daughters on 02/26/23 at 11:30 am in [facility].  Labs will need to be done prior to appt.Metabolic AcidosisModerate.  Serum CO2 16 on 01/08/23 vs 18-20 1/11-1/14/24.-Increase to sodium bicarb 1300 mg po BID-Monitor dailyHypokalemiaCorrected.  Likely due to poor oral intake.-Would continue KCL 40 mEq daily or OK to give IV-BMP daily  Hypocalcemia?Serum calcium 6.9-7.5 on 1/11-1/16/24.  Albumin 1.5 on 01/07/23.  Corrected calcium within normal range.  Albumin trending down.  Profoundly hypoalbuminemic.-Check ionized calcium-Encourage po intake of food-BMP dailyHyponatremiaMild.  Serum sodium 134 on 01/09/23.-BMP daily; monitor-Continue protein supplementsHypomagnesemiaCorrected.  Serum Mg 1.6 on 1/16.  Mg Ox 800 mg given on 01/09/23-Continue Mg Ox 800 mg po daily; will need to discharge on Mg supplement-Repeat Mg lab dailyAnemia of Chronic Renal DiseaseHgb 9.5 on 01/09/23.  Normocytic anemia.  Iron sat 17 on 01/03/23.-Would hold off IV iron due to infection-Continue po iron daily-CBC daily  Subjective: Subjective  Interim History:O/N no acute events.  Seen this afternoon, lying in bed.  No specific complaints.  Denies headache, dizziness, nausea.  SBP 130-150s.  O2 sat 93-96% on room air.Plan:  Discharge planning in process to home.  STR was recommended, but he prefers to go home.Review of Allergies/Meds/Hx: I have reviewed the patient's: allergies and current scheduled medications Current Facility-Administered Medications Medication Dose Route Frequency Provider Last Rate Last Admin ? amLODIPine (NORVASC) tablet 10 mg  10 mg Oral Daily Sedaliu, Tomor, MD   10 mg at 01/09/23 3664 ? amoxicillin-clavulanate (AUGMENTIN) 500-125 mg tablet 500 mg  500 mg Oral 2 times daily Cindee Salt, MD   500 mg at 01/09/23 4034 ? bisacodyL (DULCOLAX) EC tablet 10 mg  10 mg Oral Daily Peyton Bottoms, APRN   10 mg at 01/09/23 7425 ?  doxycycline TAB/CAP 100 mg  100 mg Oral 2 times daily Peyton Bottoms, APRN   100 mg at 01/09/23 6213 ? ferrous sulfate (FEOSOL) tablet 325 mg  325 mg Oral BID WC Giuran-Benetato, Nena Polio, MD   325 mg at 01/09/23 0851 ? heparin (PORCINE) injection 5,000 Units  5,000 Units Subcutaneous Q8H Sedaliu, Tomor, MD   5,000 Units at 01/09/23 0529 ? hydrALAZINE (APRESOLINE) tablet 50 mg  50 mg Oral 3 times daily Peyton Bottoms, APRN   50 mg at 01/09/23 0865 ? metoprolol succinate XL (TOPROL-XL) 24 hr tablet 25 mg  25 mg Oral Daily Sedaliu, Tomor, MD   25 mg at 01/09/23 0851 ? rosuvastatin (CRESTOR) tablet 5 mg  5 mg Oral Daily Sedaliu, Tomor, MD   5 mg at 01/09/23 7846 ? senna (SENOKOT) tablet 17.2 mg  2 tablet Oral Nightly Peyton Bottoms, APRN     ? sodium bicarbonate tablet Tab 650 mg  650 mg Oral BID Ernst Bowler, APRN   650 mg at 01/09/23 9629 ? sodium chloride 0.9 % flush 3 mL  3 mL IV Push Q8H Sedaliu, Tomor, MD   3 mL at 01/07/23 1323 ? tiotropium bromide (SPIRIVA RESPIMAT) 2.5 mcg/actuation inhalation spray 2 Inhalation  2 Inhalation Inhalation Daily Gearldine Bienenstock, PA   2 Inhalation at 01/09/23 1135 ? sodium chloride 0.9% with 20 mEq KCl/L 50 mL/hr (01/08/23 2018) acetaminophen, albuterol **AND** tiotropium bromide, dextromethorphan-guaiFENesin, ondansetron **OR** ondansetron (ZOFRAN) IV Push, oxyCODONE, polyethylene glycol, sodium chloride  Objective  Objective: Vitals:I have reviewed the current vital signs Patient Vitals for the past 24 hrs: BP Temp Temp src Pulse Resp SpO2 01/09/23 0808 (!) 155/70 97.6 ?F (36.4 ?C) Oral 77 18 96 % 01/09/23 0534 (!) 159/75 98.1 ?F (36.7 ?C) Oral 90 20 94 % 01/08/23 2111 -- -- -- -- -- (!) 93 % 01/08/23 2050 (!) 145/78 98.2 ?F (36.8 ?C) Oral 89 18 (!) 93 % 01/08/23 1458 138/80 97.8 ?F (36.6 ?C) Oral 77 16 95 % I/O's:Gross Totals (Last 24 hours) at 01/09/2023 1255Last data filed at 01/09/2023 1100Intake 840 ml Output 2375 ml Net -1535 ml Physical Exam Constitutional:  Ill appearing, older male.  AAOX3.  Eyes  : Not pale, anicteric scleraeENT : moist oral mucosaNeck: No JVD notedCardiovascular: RRR S1 S2, no m/r/gChest: CTAB, diminished at bases.  No rales.  Work of breathing normal.Abdominal: Large, BS hypo, soft, nt GU : Tennyson cathExtremities:  Bilateral LE edema trace on right, +1/2 on left.  Left lower leg wrapped in ace bandage.Skin : No rash on limited examNeurological:  Alert and oriented to person, place, and time. Psychiatric: Mood and affect normal.Labs:I have reviewed the current lab results Recent Labs Lab 01/12/240632 01/13/241343 01/14/240514 01/15/240715 01/16/240631 NA 133* 135* 133* 133* 134* K 3.2* 3.5 4.0 3.8 3.8 CL 100 100 101 102 103 CO2 20* 20* 19* 17* 16* BUN 38* 34* 38* 36* 33* CREATININE 4.04* 4.02* 3.59* 3.42* 3.57* GLU 157* 115* 116* 120* 120* CALCIUM 7.1* 7.0* 7.0* 6.9* 7.5* MG 1.5* 2.0 1.9 1.8 1.6* PHOS 2.9  --  3.5 4.3 4.4 Recent Labs Lab 01/12/240632 01/14/240514 01/15/240715 01/16/240631 WBC 12.1* 9.4 9.4 8.8 HGB 9.7* 9.7* 9.8* 9.5* HCT 28.00* 28.60* 28.90* 28.70* PLT 251 303 335 339 Recent Labs Lab 01/10/240614 01/11/240012 ALKPHOS 89 112 BILITOT 0.4 0.3 BILIDIR 0.2  --  PROT 5.7* 6.5 ALT 45 50 AST 47* 58*  No results for input(s): PHUA, PROTEINUA, UROBILINOGEN in the last 168 hours.Invalid input(s): COLORUA, CLARITYUA, SPECGRAVUA, GLUCUA, Bruce,  BLOODUA, NITRITEUA, LEUKOCYTESUA, BILIUA, WBC UA, RBC UA   Signed: Claude Manges, APRN PACT Kidney CenterOffice: [phone] 16, 20249:07 AM

## 2023-01-10 ENCOUNTER — Emergency Department: Admit: 2023-01-10 | Payer: MEDICARE | Primary: Internal Medicine

## 2023-01-10 ENCOUNTER — Inpatient Hospital Stay
Admit: 2023-01-10 | Discharge: 2023-01-10 | Payer: MEDICARE | Attending: Student in an Organized Health Care Education/Training Program

## 2023-01-10 ENCOUNTER — Encounter: Admit: 2023-01-10 | Payer: PRIVATE HEALTH INSURANCE | Attending: Internal Medicine | Primary: Internal Medicine

## 2023-01-10 ENCOUNTER — Encounter: Admit: 2023-01-10 | Payer: PRIVATE HEALTH INSURANCE | Attending: "Endocrinology | Primary: Internal Medicine

## 2023-01-10 DIAGNOSIS — Z96641 Presence of right artificial hip joint: Secondary | ICD-10-CM

## 2023-01-10 DIAGNOSIS — Z79899 Other long term (current) drug therapy: Secondary | ICD-10-CM

## 2023-01-10 DIAGNOSIS — E785 Hyperlipidemia, unspecified: Secondary | ICD-10-CM

## 2023-01-10 DIAGNOSIS — Z1152 Encounter for screening for COVID-19: Secondary | ICD-10-CM

## 2023-01-10 DIAGNOSIS — N138 Other obstructive and reflux uropathy: Secondary | ICD-10-CM

## 2023-01-10 DIAGNOSIS — N401 Enlarged prostate with lower urinary tract symptoms: Secondary | ICD-10-CM

## 2023-01-10 DIAGNOSIS — Z6832 Body mass index (BMI) 32.0-32.9, adult: Secondary | ICD-10-CM

## 2023-01-10 DIAGNOSIS — Z87891 Personal history of nicotine dependence: Secondary | ICD-10-CM

## 2023-01-10 DIAGNOSIS — E8809 Other disorders of plasma-protein metabolism, not elsewhere classified: Secondary | ICD-10-CM

## 2023-01-10 DIAGNOSIS — L03116 Cellulitis of left lower limb: Secondary | ICD-10-CM

## 2023-01-10 DIAGNOSIS — D631 Anemia in chronic kidney disease: Secondary | ICD-10-CM

## 2023-01-10 DIAGNOSIS — E876 Hypokalemia: Secondary | ICD-10-CM

## 2023-01-10 DIAGNOSIS — N184 Chronic kidney disease, stage 4 (severe): Secondary | ICD-10-CM

## 2023-01-10 DIAGNOSIS — I160 Hypertensive urgency: Secondary | ICD-10-CM

## 2023-01-10 DIAGNOSIS — D696 Thrombocytopenia, unspecified: Secondary | ICD-10-CM

## 2023-01-10 DIAGNOSIS — N179 Acute kidney failure, unspecified: Secondary | ICD-10-CM

## 2023-01-10 DIAGNOSIS — I129 Hypertensive chronic kidney disease with stage 1 through stage 4 chronic kidney disease, or unspecified chronic kidney disease: Secondary | ICD-10-CM

## 2023-01-10 DIAGNOSIS — E871 Hypo-osmolality and hyponatremia: Secondary | ICD-10-CM

## 2023-01-10 DIAGNOSIS — E8770 Fluid overload, unspecified: Secondary | ICD-10-CM

## 2023-01-10 DIAGNOSIS — E669 Obesity, unspecified: Secondary | ICD-10-CM

## 2023-01-10 DIAGNOSIS — M79662 Pain in left lower leg: Secondary | ICD-10-CM

## 2023-01-10 DIAGNOSIS — R2242 Localized swelling, mass and lump, left lower limb: Secondary | ICD-10-CM

## 2023-01-10 DIAGNOSIS — I10 Essential (primary) hypertension: Secondary | ICD-10-CM

## 2023-01-10 LAB — CBC WITH AUTO DIFFERENTIAL
BKR WAM ABSOLUTE IMMATURE GRANULOCYTES.: 0.07 x 1000/ÂµL (ref 0.00–0.30)
BKR WAM ABSOLUTE LYMPHOCYTE COUNT.: 1.07 x 1000/??L (ref 0.60–3.70)
BKR WAM ABSOLUTE NRBC (2 DEC): 0 x 1000/??L (ref 0.00–1.00)
BKR WAM ANALYZER ANC: 5.9 x 1000/ÂµL (ref 2.00–7.60)
BKR WAM BASOPHIL ABSOLUTE COUNT.: 0.08 x 1000/??L (ref 0.00–1.00)
BKR WAM BASOPHILS: 1 % (ref 0.0–1.4)
BKR WAM EOSINOPHIL ABSOLUTE COUNT.: 0.12 x 1000/??L (ref 0.00–1.00)
BKR WAM EOSINOPHILS: 1.6 % (ref 0.0–5.0)
BKR WAM HEMATOCRIT (2 DEC): 28.7 % — ABNORMAL LOW (ref 38.50–50.00)
BKR WAM HEMOGLOBIN: 9.5 g/dL — ABNORMAL LOW (ref 13.2–17.1)
BKR WAM IMMATURE GRANULOCYTES: 0.9 % (ref 0.0–1.0)
BKR WAM LYMPHOCYTES: 14 % — ABNORMAL LOW (ref 17.0–50.0)
BKR WAM MCH (PG): 30.4 pg — ABNORMAL HIGH (ref 27.0–33.0)
BKR WAM MCHC: 33.1 g/dL — ABNORMAL LOW (ref 31.0–36.0)
BKR WAM MCV: 91.7 fL — ABNORMAL HIGH (ref 80.0–100.0)
BKR WAM MONOCYTE ABSOLUTE COUNT.: 0.43 x 1000/??L (ref 0.00–1.00)
BKR WAM MONOCYTES: 5.6 % (ref 4.0–12.0)
BKR WAM MPV: 10.4 fL (ref 8.0–12.0)
BKR WAM NEUTROPHILS: 76.9 % — ABNORMAL HIGH (ref 39.0–72.0)
BKR WAM NUCLEATED RED BLOOD CELLS: 0 % (ref 0.0–1.0)
BKR WAM PLATELETS: 355 x1000/ÂµL (ref 150–420)
BKR WAM RDW-CV: 12 % (ref 11.0–15.0)
BKR WAM RED BLOOD CELL COUNT.: 3.13 M/ÂµL — ABNORMAL LOW (ref 4.00–6.00)
BKR WAM WHITE BLOOD CELL COUNT: 7.7 x1000/??L (ref 4.0–11.0)

## 2023-01-10 LAB — BASIC METABOLIC PANEL
BKR ANION GAP: 12 g/dL — ABNORMAL LOW (ref 5–16)
BKR BLOOD UREA NITROGEN: 33 mg/dL — ABNORMAL HIGH (ref 7–18)
BKR BUN / CREAT RATIO: 10 (ref 8.0–23.0)
BKR CALCIUM: 7.3 mg/dL — ABNORMAL LOW (ref 8.5–10.5)
BKR CHLORIDE: 102 mmol/L (ref 98–107)
BKR CO2: 19 mmol/L — ABNORMAL LOW (ref 21–32)
BKR CREATININE: 3.29 mg/dL — ABNORMAL HIGH (ref 0.80–1.30)
BKR EGFR, CREATININE (CKD-EPI 2021): 20 mL/min/{1.73_m2} — ABNORMAL LOW (ref >=60–420)
BKR GLUCOSE: 112 mg/dL — ABNORMAL HIGH (ref 70–100)
BKR POTASSIUM: 3.7 mmol/L (ref 3.5–5.1)
BKR SODIUM: 133 mmol/L — ABNORMAL LOW (ref 136–145)

## 2023-01-10 LAB — MAGNESIUM: BKR MAGNESIUM: 1.7 mg/dL — ABNORMAL LOW (ref 1.8–2.5)

## 2023-01-10 LAB — PHOSPHORUS     (BH GH L LMW YH): BKR PHOSPHORUS: 4.3 mg/dL (ref 2.5–4.9)

## 2023-01-10 LAB — CALCIUM, IONIZED, SERUM: BKR CALCIUM, IONIZED, SERUM: 4.62 mg/dL — ABNORMAL LOW (ref 4.65–5.28)

## 2023-01-10 MED ORDER — ONDANSETRON HCL (PF) 4 MG/2 ML INJECTION SOLUTION
42 mg/2 mL | Freq: Once | INTRAVENOUS | Status: CP
Start: 2023-01-10 — End: ?
  Administered 2023-01-11: 03:00:00 4 mL via INTRAVENOUS

## 2023-01-10 MED ORDER — SIMVASTATIN 20 MG TABLET
20 mg | Freq: Every evening | ORAL | Status: AC
Start: 2023-01-10 — End: ?

## 2023-01-10 MED ORDER — DOXYCYCLINE HYCLATE 100 MG CAPSULE
100 mg | ORAL_CAPSULE | Freq: Two times a day (BID) | ORAL | 1 refills | Status: AC
Start: 2023-01-10 — End: ?

## 2023-01-10 MED ORDER — OXYCODONE IMMEDIATE RELEASE 5 MG TABLET
5 mg | ORAL_TABLET | Freq: Three times a day (TID) | ORAL | 1 refills | Status: AC | PRN
Start: 2023-01-10 — End: ?

## 2023-01-10 MED ORDER — ENSURE ORAL LIQUID
Freq: Three times a day (TID) | ORAL | 2 refills | Status: AC
Start: 2023-01-10 — End: ?

## 2023-01-10 MED ORDER — FERROUS SULFATE 325 MG (65 MG IRON) TABLET
32565 mg (65 mg iron) | ORAL_TABLET | Freq: Two times a day (BID) | ORAL | 1 refills | Status: AC
Start: 2023-01-10 — End: ?

## 2023-01-10 MED ORDER — SODIUM CHLORIDE 0.9 % BOLUS (NEW BAG)
0.9 % | INTRAVENOUS | Status: CP
Start: 2023-01-10 — End: ?
  Administered 2023-01-11: 03:00:00 0.9 mL/h via INTRAVENOUS

## 2023-01-10 MED ORDER — ACETAMINOPHEN 325 MG TABLET
325 mg | Freq: Once | ORAL | Status: CP
Start: 2023-01-10 — End: ?
  Administered 2023-01-11: 03:00:00 325 mg via ORAL

## 2023-01-10 MED ORDER — MORPHINE 4 MG/ML INTRAVENOUS SOLUTION
4 mg/mL | Freq: Once | INTRAVENOUS | Status: CP
Start: 2023-01-10 — End: ?
  Administered 2023-01-11: 03:00:00 4 mL via INTRAVENOUS

## 2023-01-10 MED ORDER — CALCIUM CARBONATE 200 MG CALCIUM (500 MG) CHEWABLE TABLET
500200 mg (200 mg calcium) | ORAL_TABLET | Freq: Two times a day (BID) | ORAL | 1 refills | Status: AC
Start: 2023-01-10 — End: ?

## 2023-01-10 MED ORDER — SODIUM BICARBONATE 650 MG TABLET
650 mg | ORAL_TABLET | Freq: Two times a day (BID) | ORAL | 1 refills | Status: AC
Start: 2023-01-10 — End: ?

## 2023-01-10 MED ORDER — MAGNESIUM OXIDE 400 MG (241.3 MG MAGNESIUM) TABLET
400241.3 mg (241.3 mg magnesium) | Freq: Once | ORAL | Status: CP
Start: 2023-01-10 — End: ?
  Administered 2023-01-10: 15:00:00 400 mg (241.3 mg magnesium) via ORAL

## 2023-01-10 MED ORDER — WALKER
1 refills | Status: AC
Start: 2023-01-10 — End: ?

## 2023-01-10 MED ORDER — AMOXICILLIN 500 MG-POTASSIUM CLAVULANATE 125 MG TABLET
500-125 mg | ORAL_TABLET | Freq: Two times a day (BID) | ORAL | 1 refills | Status: AC
Start: 2023-01-10 — End: ?

## 2023-01-10 MED ORDER — MAGNESIUM OXIDE 400 MG (241.3 MG MAGNESIUM) TABLET
400241.3 mg (241.3 mg magnesium) | ORAL_TABLET | Freq: Every day | ORAL | 1 refills | Status: AC
Start: 2023-01-10 — End: ?

## 2023-01-10 MED ORDER — AMLODIPINE 10 MG TABLET
10 mg | ORAL_TABLET | Freq: Every day | ORAL | 1 refills | Status: AC
Start: 2023-01-10 — End: ?

## 2023-01-10 MED ORDER — DEXTROMETHORPHAN-GUAIFENESIN 10 MG-100 MG/5 ML ORAL SYRUP
10-1005 mg/5 mL | ORAL | 1 refills | Status: AC | PRN
Start: 2023-01-10 — End: ?

## 2023-01-10 NOTE — Telephone Encounter (Signed)
Pt made aware of MD's recommendations and verbalized understanding.  

## 2023-01-10 NOTE — Telephone Encounter (Signed)
Left message to call back.   Minna Merritts, MD  [facility] [phone] hours ago (5:35 PM)    Would continue to monitor blood pressure For systolic pressure less than 100 would take Cardura/doxazosin only 1 mg once a day not twice a day Thx TGollan

## 2023-01-10 NOTE — Discharge Summary
[facility] Discharge SummaryPatient Data:  Patient Name: Marc Evans Admit date: 12/31/2022 Age: 72 y.o. Discharge date: 01/10/23 DOB: 01/05/1951	 Discharge Attending Physician: Merdis Delay, MD  MRN: [id number]	 Discharged Condition: stable PCP: Meredith Leeds, MD Disposition: Home with Homecare Services Principal Diagnosis: LLE cellulitisSecondary Diagnoses: AKI on CKDGeneralized weaknessHyponatremiaHypokalemiaHypomagnesemiaHTNHLDIssues to be Addressed Post Discharge: Issues to be Addressed Post Discharge:1.	Follow up with repeat blood work to reassess kidney function and electrolyte in one week2.	Follow up with nephrologist3.	Follow up with primary care provider following hospitalizationRelevant Medications on Discharge:New Medications: Augmentin, Doxycycline, mag oxide, iron.Pending Labs and Tests: Follow-up Information:Dorobisz, Jacqlyn Krauss MD[address] [phone] in 14 day(s)11:30 am.  Do outpatient labs at [facility] one to two weeks prior to appt.Sheral Apley FOR [facility] CARE[address], [place], [place] [place] [phone] an appointment as soon as possible for a visit in 1 week(s) Future Appointments Date Time Provider Department Center 05/08/2023 10:45 AM Mosie Epstein, [place], [place] [facility] Course: Hospital Course: This is a 72yo male with hx of HTN, HLD, CKD who presented to [facility] ED on 1/7 with complaint of weakness for 3 days, diarrhea, reduced oral intake. It was noted that patient had fallen while in the parking lot and helped into the ED in a wheel chair. In the ED he was hypertensive 202/139, improving 133/80. ?Chest x-ray was negative [place] head C-spine negative, [place] chest abdomen pelvis showed chronic large airway disease, Dopplers were negative, sodium 129 creatinine 4.25 baseline around 2-3, troponin 34, 35, lactate negative, white count 12, platelets 113. ?UA was negative. ?Patient found to have cellulitis of Left leg.  Patient admitted with LLE cellulitis, AKI on CKD.Regarding LLE cellulitis-gradually improving-significant leg edema suspected to be contributing-ID following - transition to doxycycline and augmentin to complete 14 days course of treatment-completed 10 days of antibiotic treatment while admitted, will continue 4 more days of abx upon discharge?Regarding AKI on CKD-Cr gradually improved from 4.25 to 3.29-baseline Cr appears to be 2.7-3-there has been ongoing improvement despite d/c IV fluids yesterday-nephrology following - [place] shows no obstructive nephropathy. AKI possibly ATN iso infection vs prerenal/CRS. Has scheduled outpatient f/u with Dr. Larna Daughters on 02/26/23-plan for repeat labs as outpatient to reassess kidney function in one week-cont bicarb tabs?Regarding Hyponatremia-mild, possibly in setting of poor oral intake-urine sodium noted to be <50 suggestive of hypovolemia?Regarding Hypomagnesemia-oral mag replacement prescribed-repeat mag level in one week?Regarding Hypokalemia-improvedRegarding hypocalcemia-corrected calcium is normal however ionized calcium is low-will cont calcium carbonate 500mg  BID upon discharge?Regarding HTN-cont amlodipine-cont hydralazine-cont metoprolol?Regarding HLD-cont statin?Regarding Generalized weakness-PT initially recommended STR, now recommending home with PT services. Patient able to climb 5 stairs toady-OT recommend home OT-patient did not wish to be discharged to STR?Regarding Poor oral intake, hypoalbuminemia-albumin low at 1.5-some improvement in oral intake-patient declined meal delivery services when offered by social work. I discussed need for ongoing ensure supplementation with patient. We also discussed need for 3 nutritious meals days.Inpatient Consultants and summary of recommendations:Nephrology - Etiology of AKI possibly ATN iso infection vs prerenal/CRS. ?[place] AP without iv contrast 12/31/22 shows no obstructive nephropathy. Would stop IVF;  Assess off IVF prior to discharge tomorrow. Continue to hold losartan. He has a hospital follow-up appointment in outpatient nephrology with Dr. Larna Daughters on 02/26/23 at 11:30 am in [facility].  Labs will need to be done prior to appt. Continue Mg Ox 800 mg po daily; will need to discharge on Mg supplement.Pertinent Procedures or Surgeries: n/aPertinent lab  findings and test results: Objective: Recent Labs Lab 01/15/240715 01/16/240631 01/17/240651 WBC 9.4 8.8 7.7 HGB 9.8* 9.5* 9.5* HCT 28.90* 28.70* 28.70* PLT 335 339 355  Recent Labs Lab 01/15/240715 01/16/240631 01/17/240651 NEUTROPHILS 77.9* 78.8* 76.9*  Recent Labs Lab 01/15/240715 01/16/240631 01/17/240651 NA 133* 134* 133* K 3.8 3.8 3.7 CL 102 103 102 CO2 17* 16* 19* BUN 36* 33* 33* CREATININE 3.42* 3.57* 3.29* GLU 120* 120* 112* ANIONGAP 14 15 12   Recent Labs Lab 01/15/240715 01/16/240631 01/17/240651 CALCIUM 6.9* 7.5* 7.3* MG 1.8 1.6* 1.7* PHOS 4.3 4.4 4.3  Recent Labs Lab 01/11/240012 ALT 50 AST 58* ALKPHOS 112 BILITOT 0.3  No results for input(s): PTT, LABPROT, INR in the last 168 hours. Culture Information:No results for input(s): LABBLOO, LABURIN, LOWERRESPIRA in the last 168 hours.Imaging: Imaging results last 1 week:  XR Chest PA or APResult Date: 01/03/2023 Mild fluid overload. Reported and signed by: Maudie Flakes, MD  [facility] Diet:  Regular dietMobility: Highest Level of mobility - ACTUAL: Mobility Level 7, Walk 25+ feet, AM PAC 22-23Physical Therapy Disposition Recommendation: HomeAdditional Physical Therapy Disposition Recommendations: Home Physical Therapy; Home Occupational Therapy; Home Health AidePhysical Exam Discharge vitals: Temp:  [97.3 ?F (36.3 ?C)-98.1 ?F (36.7 ?C)] 97.3 ?F (36.3 ?C)Pulse:  [83-88] 88Resp:  [16-20] 16BP: (139-159)/(64-86) 144/74SpO2:  [94 %-96 %] 96 %Device (Oxygen Therapy): room air Pertinent Findings of Physical Exam: UnremarkableCognitive Status at Discharge: Alert and Oriented x 3Discharge Physical Exam:Physical ExamConstitutional: Elderly male laying comfortably in bed, NADHEENT: Head:  Normocephalic, atraumatic. Eyes: EOMI, sclera white, conjunctiva pink. Mouth/Throat: Mucous membranes pink and moist. Cardiovascular: RRR, S1/S2 present, no murmurPulmonary/Chest: Lungs CTA bilaterally, no increased WOB. No wheezing, no rales or rhonchi noted Gl: Soft,nondistended  Non tender to palpation, no gaurding or mass. Positive BS in 4 quadrantsGU: No suprapubic tenderness or distensionMusculoskeletal:  Calves soft and non tender, ambulatory with PTNeurological:awake, alert and oriented to person, place, no focal deficits notedIntegumentary: Improving erythema to LLE, thickening dry flaking skin. No open wound.Psychiatric: normal mood and affectAllergies No Known Allergies PMH PSH Past Medical History: Diagnosis Date ? High blood pressure  ? Hypertension   Past Surgical History: Procedure Laterality Date ? TONSILLECTOMY    as a child ? 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  Social History Family History Social History Tobacco Use ? Smoking status: Former   Current packs/day: 0.00   Types: Cigarettes   Quit date: 2000   Years since quitting: 24.0 ? Smokeless tobacco: Not on file Substance Use Topics ? Alcohol use: No  Family History Problem Relation Age of Onset ? Arrhythmia Mother  ? Heart failure Father  ? Heart attack Father ? No Known Problems Sister  ? No Known Problems Brother   Discharge Medications: Discharge: Current Discharge Medication List  START taking these medications  Details amLODIPine (NORVASC) 10 mg tablet Take 1 tablet (10 mg total) by mouth daily.Qty: 30 tablet, Refills: 0Start date: 01/11/2023  amoxicillin-clavulanate (AUGMENTIN) 500-125 mg per tablet Take 1 tablet (500 mg total) by mouth 2 (two) times daily for 4 days.Qty: 8 tablet, Refills: 0Start date: 01/10/2023, End date: 01/14/2023  calcium carbonate (TUMS) 500 mg (200 mg calcium) chewable tablet Take 1 tablet (500 mg total) by mouth 2 (two) times daily.Qty: 60 tablet, Refills: 0Start date: 01/10/2023  dextromethorphan-guaiFENesin (ROBITUSSIN-DM) 10-100 mg/5 mL syrup Take 5 mLs by mouth every 4 (four) hours as needed for cough for up to 10 days.Qty: 118 mL, Refills: 0Start date: 01/10/2023, End date: 01/20/2023  doxycycline hyclate (VIBRAMYCIN) 100  mg capsule Take 1 capsule (100 mg total) by mouth 2 (two) times daily for 4 days.Qty: 8 capsule, Refills: 0Start date: 01/10/2023, End date: 01/14/2023  ferrous sulfate (FEOSOL) 325 mg (65 mg iron) tablet Take 1 tablet (325 mg total) by mouth 2 (two) times daily with breakfast and dinner.Qty: 60 tablet, Refills: 0Start date: 01/10/2023  magnesium oxide (MAG-OX) 400 mg (241.3 mg magnesium) tablet Take 1 tablet (400 mg total) by mouth daily.Qty: 14 tablet, Refills: 0Start date: 01/10/2023  nutritional supplement (ENSURE) liquid Take 118 mLs by mouth 3 (three) times daily with meals.Qty: 2360 mL, Refills: 1Start date: 01/10/2023  sodium bicarbonate 650 mg tablet Take 2 tablets (1,300 mg total) by mouth 2 (two) times daily.Qty: 60 tablet, Refills: 0Start date: 01/10/2023   CONTINUE these medications which have NOT CHANGED  Details hydrALAZINE (APRESOLINE) 50 mg tablet Take 1 tablet (50 mg total) by mouth 3 (three) times daily. metoprolol succinate XL (TOPROL-XL) 25 mg 24 hr tablet Take 1 tablet (25 mg total) by mouth daily. Take with or immediately following a meal.  simvastatin (ZOCOR) 20 mg tablet Take 1 tablet (20 mg total) by mouth nightly.   STOP taking these medications   losartan (COZAAR) 25 mg tablet    There was at total of approximately 40 minutes spent on the discharge of this patient.Discussed discharge plan with patient at bedside. Discussed STR option which he declined. Discussed option for case mgmt to arrange a ride home for him which he declined. He declined meals on wheels when offered by social work. Discussed discharge plan with attending physician, Dr. Durene Cal.Electronically Signed:Soo Steelman Caren Griffins, PA-C 01/10/2023 1:06 PMBest Contact Information: MHB

## 2023-01-10 NOTE — Plan of Care
Case Management Plan  Flowsheet Row Most Recent Value CM D/C Readiness  PASRR completed and approved N/A Authorization number obtained, if required N/A Is there a 3 day INPATIENT Qualifying stay for Medicare Patients? N/A Medicare IM- signed, dated, timed and scanned, if required Yes DME Authorized/Delivered Yes No needs identified/ follow up with PCP/MD N/A Post acute care services secured W10 complete Yes Pri Completed and Accepted  N/A Is the destination address correct on the W10 Yes Finalized Plan  Expected Discharge Date 01/10/23 Discharge Disposition Home-Health Care Svc   Plan of Care Overview/ Patient Status    Per PA Rea patient is medically ready for discharge home. [facility] to provide home care services. Rolling walker delivered to bedside. Patients car in parking lot. Curtis Sites BSN RN Case Management [employer] [place]

## 2023-01-10 NOTE — Progress Notes
[facility]	Nephrology Progress NoteAttending Provider: Merdis Delay, MD Assessment/Plan: Assessment  Marc Evans is a 72 year old male with a medical history of CKD 4, hypertension, hyperlipidemia, BPH with outflow obstruction, hx of bilateral nephrosis with bilateral nephrostomy tubes, s/p HOLEP surgery (05/18/21), history of UTIs with foley catheters, history of epididymoorchitis.  He came in for evaluation of weakness, not eating (but drinking) for three days.  Found to have AKI on CKD.  Left leg cellulitis.  Blood cultures 12/31/22 no growth to date.  ID consulted.  ?Echo 01/01/23 showed EF 55-60%; limited study; normal LV function and no valvular abnormalities.  Repeat CXR 01/03/23 showed mild fluid overload.AKI on CKD, non oliguricBaseline serum creatinine last November thought to be 2.5-3.0.  Etiology of CKD was thought to be related to obstructive nephropathy in 2021, relieved by bilateraly nephrostomy tubes, followed by HOLEP surgery in 05/18/21.  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  UAs 2021-2022 had moderate proteinuria.?Etiology of AKI possibly ATN iso infection vs prerenal/CRS.  [place] AP without iv contrast 12/31/22 shows no obstructive nephropathy.  UA 12/31/22 had 1+ protein, 1+ ketones, 2+ blood, few bacteria.  FeUrea 12/31/22 was 48.5% suggestive of intrinsic renal disease.  UPCR 11/27/22 had 0.940 g/mgCr.  UA 11/27/22 bland except for 1+ protein.?His sCr was 3.29 on 1/17, improved.  BUN stable.    Urine output 2.2 L in last 24 hours.?Recommendations/Plan:-Planning to discharge today-Continue to hold losartan-Encourage po intake; would give him nepro-Would hold lasix for now; smaking substantial urine-BMP daily-Strict I&O; measure urine output-Avoid nephrotoxins, including NSAIDS and iv contrast dye, if able-He has a hospital follow-up appointment in outpatient nephrology with Dr. Larna Daughters on 02/26/23 at 11:30 am in [facility].  Labs will need to be done prior to appt.Metabolic AcidosisModerate. Improved.  Serum CO2 19 on 01/10/23.-Continue sodium bicarb 1300 mg po BID-Monitor dailyHypokalemiaCorrected.  Stable.  Likely due to poor oral intake.-BMP daily  Hypocalcemia?Ionized calcium low on 01/10/23.  Corrected calcium has been low normal but ionized calcium shows still low.-Would discharge on calcium carbonate 500 mg po BID-Outpatient would check PTH intact, may need calcitriol-BMP dailyHyponatremiaMild.  Serum sodium 133 on 01/10/23.  -BMP daily; monitor-Continue protein supplementsHypomagnesemiaCorrected.  Serum Mg 1.7 on 1/17.  Mg Ox 800 mg given on 01/09/23-Continue Mg Ox 800 mg po daily; will need to discharge on Mg supplement-Repeat Mg lab dailyAnemia of Chronic Renal DiseaseHgb 9.5 on 01/09/23.  Normocytic anemia.  Iron sat 17 on 01/03/23.-Would hold off IV iron due to infection-Continue po iron daily-CBC daily  Subjective: Subjective  Interim History:O/N no acute events.  Seen this am, lying in bed.  No specific complaints.  Denies nausea, headache, dizziness, SOB.  SBP 130-150s.  O2 sat 93-96% on room air.  Plan:  Discharge today.Review of Allergies/Meds/Hx: I have reviewed the patient's: allergies and current scheduled medications Current Facility-Administered Medications Medication Dose Route Frequency Provider Last Rate Last Admin ? amLODIPine (NORVASC) tablet 10 mg  10 mg Oral Daily Sedaliu, Tomor, MD   10 mg at 01/10/23 0801 ? amoxicillin-clavulanate (AUGMENTIN) 500-125 mg tablet 500 mg  500 mg Oral 2 times daily Cindee Salt, MD   500 mg at 01/10/23 0801 ? bisacodyL (DULCOLAX) EC tablet 10 mg  10 mg Oral Daily Peyton Bottoms, APRN   10 mg at 01/09/23 1610 ? doxycycline TAB/CAP 100 mg  100 mg Oral 2 times daily Peyton Bottoms, APRN   100 mg at 01/10/23  0800 ? ferrous sulfate (FEOSOL) tablet 325 mg  325 mg Oral BID WC Giuran-Benetato, Nena Polio, MD   325 mg at 01/10/23 0757 ? heparin (PORCINE) injection 5,000 Units  5,000 Units Subcutaneous Q8H Sedaliu, Tomor, MD   5,000 Units at 01/10/23 0509 ? hydrALAZINE (APRESOLINE) tablet 50 mg  50 mg Oral 3 times daily Peyton Bottoms, APRN   50 mg at 01/10/23 0757 ? metoprolol succinate XL (TOPROL-XL) 24 hr tablet 25 mg  25 mg Oral Daily Sedaliu, Tomor, MD   25 mg at 01/10/23 0800 ? rosuvastatin (CRESTOR) tablet 5 mg  5 mg Oral Daily Sedaliu, Tomor, MD   5 mg at 01/10/23 0801 ? senna (SENOKOT) tablet 17.2 mg  2 tablet Oral Nightly Peyton Bottoms, APRN     ? sodium bicarbonate tablet Tab 1,300 mg  1,300 mg Oral BID Ernst Bowler, APRN   1,300 mg at 01/10/23 0800 ? sodium chloride 0.9 % flush 3 mL  3 mL IV Push Q8H Sedaliu, Tomor, MD   3 mL at 01/10/23 0509 ? tiotropium bromide (SPIRIVA RESPIMAT) 2.5 mcg/actuation inhalation spray 2 Inhalation  2 Inhalation Inhalation Daily Gearldine Bienenstock, PA   2 Inhalation at 01/10/23 1610 ? [Held by provider] sodium chloride 0.9% with 20 mEq KCl/L 50 mL/hr (01/08/23 2018) acetaminophen, albuterol **AND** tiotropium bromide, dextromethorphan-guaiFENesin, ondansetron **OR** ondansetron (ZOFRAN) IV Push, oxyCODONE, polyethylene glycol, sodium chloride  Objective  Objective: Vitals:I have reviewed the current vital signs Patient Vitals for the past 24 hrs: BP Temp Temp src Pulse Resp SpO2 01/10/23 0731 (!) 144/74 97.3 ?F (36.3 ?C) Oral 88 16 96 % 01/10/23 0515 (!) 159/86 97.3 ?F (36.3 ?C) Oral 84 20 94 % 01/09/23 2041 139/74 98.1 ?F (36.7 ?C) Oral 83 20 94 % 01/09/23 1419 (!) 157/64 97.3 ?F (36.3 ?C) Oral 87 18 96 % I/O's:Gross Totals (Last 24 hours) at 01/10/2023 1352Last data filed at 01/10/2023 1349Intake 1080 ml Output 2350 ml Net -1270 ml Physical Exam Constitutional:  NAD, calm, older male.  AAOX3.  Eyes  : Not pale, anicteric scleraeENT : moist oral mucosaNeck: No JVD notedCardiovascular: RRR S1 S2, no m/r/gChest: CTAB, diminished at bases.  No rales.  Work of breathing normal.Abdominal: Large, BS hypo, soft, nt GU : [place] cathExtremities:  LLE edema +1/+2 on left.  Left lower leg wrapped in ace bandage.Skin : No rash on limited examNeurological:  Alert and oriented to person, place, and time. Psychiatric: Mood and affect normal.Labs:I have reviewed the current lab results Recent Labs Lab 01/14/240514 01/15/240715 01/16/240631 01/17/240651 NA 133* 133* 134* 133* K 4.0 3.8 3.8 3.7 CL 101 102 103 102 CO2 19* 17* 16* 19* BUN 38* 36* 33* 33* CREATININE 3.59* 3.42* 3.57* 3.29* GLU 116* 120* 120* 112* CALCIUM 7.0* 6.9* 7.5* 7.3* MG 1.9 1.8 1.6* 1.7* PHOS 3.5 4.3 4.4 4.3 Recent Labs Lab 01/14/240514 01/15/240715 01/16/240631 01/17/240651 WBC 9.4 9.4 8.8 7.7 HGB 9.7* 9.8* 9.5* 9.5* HCT 28.60* 28.90* 28.70* 28.70* PLT 303 335 339 355 Recent Labs Lab 01/11/240012 ALKPHOS 112 BILITOT 0.3 PROT 6.5 ALT 50 AST 58*  No results for input(s): PHUA, PROTEINUA, UROBILINOGEN in the last 168 hours.Invalid input(s): COLORUA, CLARITYUA, SPECGRAVUA, GLUCUA, KETONESUA, BLOODUA, NITRITEUA, LEUKOCYTESUA, BILIUA, WBC UA, RBC UA   Signed: Claude Manges, APRN [facility] Kidney CenterOffice: [phone] 17, 20249:07 AM

## 2023-01-10 NOTE — Discharge Instructions
You were hospitalized for infection called cellulitis of your left leg. You received 10 days of antibiotics this hospitalization. We have sent 4 additional days of antibiotics to your pharmacy for you to continue. Your blood work showed that you were very dehydrated and have low protein. You need improved nutrition. Please take ensure nutrition shakes with each meal. Please eat healthy diet with protein and calories. Drink plenty of fluids. Due to your kidney function being abnormal - stop taking losartan for the time being. Follow up at outpatient lab for repeat blood work in one week. Go to scheduled follow up with your nephrologist in March. Call  your primary care provider for follow up next week.

## 2023-01-10 NOTE — Plan of Care
Shay Daiber was discharged via w/c to car parked in facility parking lot accompanied by staff. Discharge instructions explained. Patient verbalized understanding of same. Written discharge instructions provided. Denies any further questions. Stable at time of discharge. Vital signs    Vitals:  01/09/23 2041 01/10/23 0515 01/10/23 0731 01/10/23 1404 BP: 139/74 (!) 159/86 (!) 144/74 (!) 152/69 Pulse: 83 84 88 90 Resp: 20 20 16 18  Temp: 98.1 F (36.7 C) 97.3 F (36.3 C) 97.3 F (36.3 C) 97.3 F (36.3 C) TempSrc: Oral Oral Oral Oral SpO2: 94% 94% 96% 96% Weight:     Height:     Belongings charted in last 7 days: No data recorded

## 2023-01-10 NOTE — Plan of Care
Plan of Care Overview/ Patient Status    CONSTITUTIONAL: no fever, no appetite change, no weight loss MENTAL:AOx4, pleasant and cooperativeRESPIRATORY: Denies SOB,lungs wheezing on expiration, RA, IS encouraged, complains of cough nonproductive.CARDIAC: S1S2, no murmurs, denies chest pain, GASTRO: Tolerating  diet, IVF, adequate PO intake, LBM1/16/23, +BSUROLOGY: CYU continent, no frequency and no pain on urinationMUSCULOSKELETAL: +FP,CMS,BLE numbness no tingling, ambulating RWx1NEUROS: PEERLA, intactSKIN: intact, LLE cellulitis wrapped edema+4, RLE +2 edema turned and repositioned q2hrsSAFETY: Safety maintained call bell and belongings within reach. Bed alarm in placeEDUCATION: Education provided on current medication regimen. Patient able to teach back.    Basia Mcginty Darrelyn Hillock, RN

## 2023-01-10 NOTE — Care Coordination-Inpatient
This writer spClinical research associateo pt @ bedside regarding IM Verbally explainedPt understands Medicare rights and is agreeable to dc plan IM letter signed, scanned and faxed to [employer]- COPY TO MYCHARTOrthocare will provide rolling walker to pt upon dcThis writer delivered RW to pt bedside today Carollee Herter D'AngeloTransition CoordinatorCase [email or web address]

## 2023-01-10 NOTE — Plan of Care
Plan of Care Overview/ Patient Status    SOCIAL WORK NOTEPatient Name: Marc Evans Record Number: [id number] Date of Birth: [birth year]Medical Social Work Follow Up  [employer] Most Recent Value Admission Information  Document Type Progress Note (For Inpatient/ED Only) Prior psychosocial assessment has been documented within this hospitalization No (For Inpatient/ED Only) Prior psychosocial assessment has been documented within 30 days of this hospitalization No Reason for Current Social Work Regulatory affairs officer of Information Patient, Print production planner Team Comment Danna Hefty, [place],  Curtis Sites, RN case manager Record Reviewed Yes Level of Care Inpatient Psychosocial issues requiring intervention Case management requested social work to speak with Marc Evans about [employer]. Psychosocial interventions 20 minutes spent in face to face contact with Marc Evans.  I introduced myself, role and reason for my visit.  Marc Evans presented as alert and oriented, engaging and pleasant.  I discussed Meals-on-Wheels and [name] expressed that he did not need the service as he shops and cooks meals for himself daily.  He stated that he eats sufficiently at home and is mindful of what he eats.  I also discussed [employer] for out-of-pocket food delivery service.  He was receptive.  I provided him information to [facility] should he be interested in applying for Meals-on-Wheels as well the contact number to [employer].  He was appreciative of the information. Collaborations I provided an update to Sports coach and PA. Specific referrals to enhance community supports (include existing and new resources) Information on Meals-on-Wheels provided. Handoff Required? No Next Steps/Plan (including hand-off): Marc Evans was provided with information on Meals-on-Wheels and [employer] should he be interested to receive in the future.  No further social work intervention indicated.  Please re-consult if additional needs arise. Signature: Darl Pikes, [place] Contact Information: [phone]  Darl Pikes, LCSWMilford Coast Plaza Doctors Hospital [phone]

## 2023-01-10 NOTE — Plan of Care
Inpatient Occupational Therapy EvaluationDefault Flowsheet Data (most recent)   IP Adult OT Eval/Treat - 01/10/23 0849    Date of Visit / Treatment  Date of Visit / Treatment 01/10/23   1/5-wed  Note Type Evaluation   Start Time 0810   End Time 0849   Total Treatment Time 39    General Information  Pertinent History Of Current Problem Per chart, Pt is a 72 y/o male presenting with cellulitis and a report of a fall in the parking lot of the hositpal. Pt displays with LLE edema and cellulitis. Pt has a PMH consisting of HTN    Subjective I think I may be going home today   Referring Physician  Dr. Durene Cal   General Observations Pt was received supine in bed, receptive to OT intervention   Precautions/Limitations Fall Precautions   Fall History yes, reason for admission   Hand Dominance right    Weight Bearing Status  Weight Bearing Status WNL - Within normal limits    Prior Level of Functioning/Social History  Prior Level of Function independent with mobility;independent with ADLs   Patient resides with: Alone   Type of Home Apartment   Home Setup --   uses laundry mat which pt drives to  Bathroom Setup Tub only    Vital Signs and Orthostatic Vital Signs  Vital Signs Free text Increased SOB & fatigue following session, SpO2 96%, HR 104, BP 123/73.    Pain/Comfort  Location #1 - PreTreatment Rating (Numbers Scale) 0/10 - no pain   Posttreatment Rating (Numbers Scale) 0/10 - no pain    Patient Coping  Observed Emotional State accepting;cooperative    Cognition  Orientation Level Oriented X4   Level of Consciousness alert   Following Commands Follows all commands and directions without difficulty   Personal Safety / Judgment WFL - Within functional limits    Vision/ Hearing  Vision Assessment Results No vision deficits noted    Range of Motion  Range of Motion Examination RUE ROM was WNL;LUE ROM was [facility]   Range of Motion Comments mild ROM limitations at end range, limited bilateral elbow flexion    Manual Muscle Testing  Manual Muscle Testing Results No strength deficits were identified    Coordination  Opposition WFL - Within Functional Limits    Skin Assessment  Skin Assessment See Nursing Documentation    Balance  Sitting Balance: Static  GOOD-    Maintains static position against minimal resistance with no Assistive Device   Sitting Balance: Dynamic  GOOD-   Independent in dynamic balance activities (ambulates on level surfaces with no Assistive Device, picks up object from floor in sitting)   Standing Balance: Static FAIR       Maintains static position without assist or device, may require Supervision or Verbal Cues (<2 minutes)   Standing Balance: Dynamic  FAIR      Performs dynamic activities through 75% range Contact Guard or partial range (50-75%) with Supervision   Balance Assist Device Rolling walker    Lower Body Dressing  Independence/Assistance Level Complete independence   Clothing Items hospital socks;shoes   Lower Body Dressing Comments Prefers to wear shoes without socks    Toilet Training  Toilet Training Comments Pt reports urgency with urination therefore urinal use at home     AM-PAC - Daily Activity IP Short Form  Help needed from another person putting on/taking off regular lower body clothing 3 - A Little   Help needed from another person for bathing (  incl. washing, rinsing, drying) 3 - A Little   Help needed from another person for toileting (incl. using toilet, bedpan, urinal) 3 - A Little   Help needed from another person putting on/taking off regular upper body clothing 4 - None   Help needed from another person taking care of personal grooming such as brushing teeth 4 - None   Help needed from another person eating meals 4 - None   AM-PAC Daily Activity Raw Score (Total of rows above) 21   ACTUAL Highest Level of Mobility Mobility Level 7, Walk 25+ feet    Bed Mobility  Rolling/Turning Right - Independence/Assistance Level Modified independence   Supine-to-Sit Independence/Assistance Level Modified independence   Supine-to-Sit Assist Device Bed rails   LE momentum to supine>sit  Sit-to-Supine Independence/Assistance Level Complete independence   Sit-to-Supine Assist Device No device   Limitations impaired ability to control trunk for mobility;decreased ability to use legs for bridging/pushing    Sit-Stand Transfer Training  Sit-to-Stand Transfer Independence/Assistance Level Supervision;Assist of 1;Verbal cues;Nonverbal cues   Sit-to-Stand Transfer Assist Device Rolling walker   Stand-to-Sit Transfer Independence/Assistance Level Supervision;Assist of 1;Verbal cues   Stand-to-Sit Transfer Assist Device Rolling walker   Transfer Safety Analysis Concerns cues for hand placement;decreased weight-shifting ability;decreased balance during turns   Transfer Safety Analysis Impairments impaired balance;decreased strength;impaired motor control    Handoff Documentation  Handoff Patient in bed;Discussed with nursing;Patient instructed to call nursing for mobility   Handoff Comments all needs placed in reach, encouraged sitting EOB & independent sit<>stands. RN notified    Endurance  Endurance Comments fair-    Upper Extremity Exercises  Shoulder Flexion;Extension;Active range of motion;bilateral   Elbow Flexion;Extension;Active range of motion;bilateral   Hand / Finger ROM Flexion;Extension;Active range of motion;bilateral    Lower Extremity Exercises  Standing Exercises Heel raises;Marching;Knee flexion;Bilateral    Therapeutic Functional Activity  Therapeutic Functional Activity Comments Pt educated to OT role/objectives, RW use with home management activites, goals of therapy    Clinical Impression  Initial Assessment OT evaluation was completed this am & Rx was initiated with focus on ADLs. Pt presented with decreased strength, balance & activity tolerance affecting self care abiliites. Pt would benefit from OT intervention to maximize his independence with self care, home management and functional mobility. Recommend DC to home with OT/PT services.   Criteria for Skilled Therapeutic Interventions Met yes;treatment indicated   Rehab Potential good, to achieve stated therapy goals    Patient/Family Stated Goals  Patient/Family Stated Goal(s) return home;return to prior level of functioning    Frequency/Equipment Recommendations  OT Frequency 5x per week   What day of week is next treatment expected? Thursday    OT Recommendations for Inpatient Admission  Activity/Level of Assist ambulate;with rolling walker   ADL Recommendations assist to bathroom;with rolling walker    Planned Treatment / Interventions  General Treatment / Interventions Activities of Daily Living;Therapeutic Exercise     Problem: Occupational Therapy GoalsGoal: Occupational Therapy GoalsDescription:  Pt will toilet independently using AE.2.    Pt will complete LB bathing/ dressing with G dynamic standing balance using AE.3.    Pt will tolerate standing at sink to perform light self care activities with G standing balance & safety awareness.     Outcome: Initial problem identification  Marc Evans Marc Evans, OTR/L.

## 2023-01-10 NOTE — Plan of Care
Problem: Physical Therapy GoalsGoal: Physical Therapy GoalsDescription: Pt will complete bed mobility with modified (I) Pt will complete sit to stand transfers with RW and supervision Pt will ambulate 50 ft with RW and supervision Above goals appear unrealistic at this time, new goals (01/05/23)1. Patient will perform bed mobility with supervision2. Patient will perform transfers with supervision using appropriate assistive device 3. Patient will ambulate a minimum of 15 feet with supervision using appropriate assistive device  Outcome: Interventions implemented as appropriate Inpatient Physical Therapy Progress Note IP Adult PT Eval/Treat - 01/10/23 1056    Date of Visit / Treatment  Date of Visit / Treatment 01/10/23   3/4  Note Type Progress Note   Start Time 1040   End Time 1056   Total Treatment Time 16    General Information  Subjective Im feeling okay.   Other Providers Rehab tech   General Observations Consulted RN, pt recieved in bed, agreeable to PT   Precautions/Limitations Fall Precautions;Bed alarm    Vital Signs and Orthostatic Vital Signs  Vital Signs Free text SpO2: 96% on RA    Pain/Comfort  Location #1 - PreTreatment Rating (Numbers Scale) 0/10 - no pain   Pain Comment (Pre/Post Treatment Pain) Denied pain    Patient Coping  Observed Emotional State accepting;calm;cooperative    Cognition  Level of Consciousness alert   Following Commands Follows one step commands without difficulty    Skin Assessment  Skin Assessment See Nursing Documentation    Balance  Sitting Balance: Static  GOOD-    Maintains static position against minimal resistance with no Assistive Device   Sitting Balance: Dynamic  FAIR+    Performs dynamic activities through full range with Supervision   Standing Balance: Static FAIR       Maintains static position without assist or device, may require Supervision or Verbal Cues (<2 minutes)   Standing Balance: Dynamic  FAIR      Performs dynamic activities through 75% range Contact Guard or partial range (50-75%) with Supervision   Balance Assist Device Gait belt;Bariatric rolling walker    Bed Mobility  Supine-to-Sit Independence/Assistance Level Supervision   Supine-to-Sit Assist Device Head of bed elevated   Sit-to-Supine Independence/Assistance Level Supervision   Sit-to-Supine Assist Device Head of bed elevated    Sit-Stand Transfer Training  Sit-to-Stand Transfer Independence/Assistance Level Stand-by assist;Assist of 1;Verbal cues   Sit-to-Stand Transfer Assist Device Gait belt;Bariatric rolling walker   Stand-to-Sit Transfer Independence/Assistance Level Stand-by assist;Assist of 1;Verbal cues   Stand-to-Sit Transfer Assist Device Gait belt;Bariatric rolling walker   Transfer Safety Analysis Concerns cues for hand placement;decreased weight-shifting ability   Transfer Safety Analysis Impairments decreased ROM;decreased strength;impaired balance   Sit-Stand Transfer Comments VCs for hand placement   Stand-Sit Transfer Comments VCs for hand placement    Gait Training  Symptoms Noted During/After Treatment  shortness of breath;fatigue   Independence/Assistance Level  Stand-by assist   Assistive Device  Bariatric rolling walker;Gait belt   Gait Distance 100 feet;x2   Gait Pattern Analysis step through gait   Gait Analysis Impairments impaired balance;decreased ROM;decreased endurance/activity tolerance;decreased strength;decreased flexibility   Gait Training Comments VCs for upright posture    Stair Performance  Number of Stairs 4   Stair Railings present on both sides;ascending;descending   Independence/Assistance Level  Contact guard;Assist of 1;Verbal cues   Assistive Device Rail   Stairs Impairments strength decreased;impaired balance;ROM decreased   Technique step to step;ascending;descending   Stair Performance Comment VCs for sequencing    Handoff  Documentation  Handoff Patient in bed;Bed alarm;Patient instructed to call nursing for mobility    Endurance  Endurance Comments Fair/Fair-    PT- AM-PAC - Basic Mobility Screen- How much help from another person do you currently need.....  Turning from your back to your side while in a a flat bed without using rails? 3 - A Little - Requires a little help (supervision, minimal assistance). Can use assistive devices.   Moving from lying on your back to sitting on the side of a flat bed without using bed rails? 3 - A Little - Requires a little help (supervision, minimal assistance). Can use assistive devices.   Moving to and from a bed to a chair (including a wheelchair)? 3 - A Little - Requires a little help (supervision, minimal assistance). Can use assistive devices.   Standing up from a chair using your arms(e.g., wheelchair or bedside chair)? 3 - A Little - Requires a little help (supervision, minimal assistance). Can use assistive devices.   To walk in a hospital room? 3 - A Little - Requires a little help (supervision, minimal assistance). Can use assistive devices.   Climbing 3-5 steps with a railing? 3 - A Little - Requires a little help (supervision, minimal assistance). Can use assistive devices.   AMPAC Mobility Score 18   TARGET Highest Level of Mobility Mobility Level 6, Walk 10+steps   ACTUAL Highest Level of Mobility Mobility Level 7, Walk 25+ feet    Therapeutic Exercise  Therapeutic Exercise Comments Encouraged to perform b/l LE ther-ex as tolerated in bed    Therapeutic Functional Activity  Therapeutic Functional Activity Comments Educated pt on role of PT in acute care, goals with PT, PT POC, progress with PT, safety with mobility and DC planning. Encouraged pt to ambulate with nsg as tolerated.    Clinical Impression  Follow up Assessment Tolerated treatment well with good overall effort. The pt was able to increase his gait distance with no LOB observed. Fairly steady negotiations on stairs. Remains deconditioned, fatigues with icreased activity with DOE noted. The pt will continue to benefit from skilled PT services to address deficits   Criteria for Skilled Therapeutic Interventions Met yes;treatment indicated   Rehab Potential good, to achieve stated therapy goals    Patient/Family Stated Goals  Patient/Family Stated Goal(s) return home;decrease pain;get stronger;feel better    Frequency/Equipment Recommendations  What day of week is next treatment expected? Thursday    PT Discharge Summary  Physical Therapy Disposition Recommendation Home   Additional Physical Therapy Disposition Recommendations Home Physical Therapy;Home Occupational Therapy;Home Health Aide   Equipment Recommendations for Discharge Rolling walker     Marc Evans, Marc Evans

## 2023-01-11 ENCOUNTER — Encounter: Admit: 2023-01-11 | Payer: PRIVATE HEALTH INSURANCE | Primary: Internal Medicine

## 2023-01-11 DIAGNOSIS — N189 Chronic kidney disease, unspecified: Secondary | ICD-10-CM

## 2023-01-11 DIAGNOSIS — E785 Hyperlipidemia, unspecified: Secondary | ICD-10-CM

## 2023-01-11 DIAGNOSIS — L03116 Cellulitis of left lower limb: Secondary | ICD-10-CM

## 2023-01-11 DIAGNOSIS — I129 Hypertensive chronic kidney disease with stage 1 through stage 4 chronic kidney disease, or unspecified chronic kidney disease: Secondary | ICD-10-CM

## 2023-01-11 LAB — CBC WITH AUTO DIFFERENTIAL
BKR WAM ABSOLUTE IMMATURE GRANULOCYTES.: 0.11 x 1000/??L (ref 0.00–0.30)
BKR WAM ABSOLUTE LYMPHOCYTE COUNT.: 1.2 x 1000/??L (ref 0.60–3.70)
BKR WAM ABSOLUTE NRBC (2 DEC): 0 x 1000/??L (ref 0.00–1.00)
BKR WAM ANALYZER ANC: 9.15 x 1000/??L — ABNORMAL HIGH (ref 2.00–7.60)
BKR WAM BASOPHIL ABSOLUTE COUNT.: 0.06 x 1000/??L (ref 0.00–1.00)
BKR WAM BASOPHILS: 0.5 % (ref 0.0–1.4)
BKR WAM EOSINOPHIL ABSOLUTE COUNT.: 0.11 x 1000/??L (ref 0.00–1.00)
BKR WAM EOSINOPHILS: 1 % (ref 0.0–5.0)
BKR WAM HEMATOCRIT (2 DEC): 30.8 % — ABNORMAL LOW (ref 38.50–50.00)
BKR WAM HEMOGLOBIN: 10.3 g/dL — ABNORMAL LOW (ref 13.2–17.1)
BKR WAM IMMATURE GRANULOCYTES: 1 % (ref 0.0–1.0)
BKR WAM LYMPHOCYTES: 10.7 % — ABNORMAL LOW (ref 17.0–50.0)
BKR WAM MCH (PG): 30.6 pg (ref 27.0–33.0)
BKR WAM MCHC: 33.4 g/dL (ref 31.0–36.0)
BKR WAM MCV: 91.4 fL — ABNORMAL HIGH (ref 80.0–100.0)
BKR WAM MONOCYTE ABSOLUTE COUNT.: 0.58 x 1000/??L (ref 0.00–1.00)
BKR WAM MONOCYTES: 5.2 % (ref 4.0–12.0)
BKR WAM MPV: 10.7 fL (ref 8.0–12.0)
BKR WAM NEUTROPHILS: 81.6 % — ABNORMAL HIGH (ref 39.0–72.0)
BKR WAM NUCLEATED RED BLOOD CELLS: 0 % (ref 0.0–1.0)
BKR WAM PLATELETS: 397 x1000/ÂµL (ref 150–420)
BKR WAM RDW-CV: 12 % (ref 11.0–15.0)
BKR WAM RED BLOOD CELL COUNT.: 3.37 M/??L — ABNORMAL LOW (ref 4.00–6.00)
BKR WAM WHITE BLOOD CELL COUNT: 11.2 x1000/ÂµL — ABNORMAL HIGH (ref 4.0–11.0)

## 2023-01-11 LAB — HEPATIC FUNCTION PANEL
BKR A/G RATIO: 0.6 — ABNORMAL LOW (ref 1.0–2.2)
BKR ALANINE AMINOTRANSFERASE (ALT): 81 U/L — ABNORMAL HIGH (ref 9–59)
BKR ALBUMIN: 3 g/dL — ABNORMAL LOW (ref 3.6–4.9)
BKR ALKALINE PHOSPHATASE: 119 U/L (ref 9–122)
BKR ASPARTATE AMINOTRANSFERASE (AST): 66 U/L — ABNORMAL HIGH (ref 10–35)
BKR AST/ALT RATIO: 0.8
BKR BILIRUBIN DIRECT: <0.2 mg/dL (ref 1.7–<=0.3)
BKR BILIRUBIN TOTAL: 0.4 mg/dL (ref <=1.2)
BKR GLOBULIN: 5.1 g/dL — ABNORMAL HIGH (ref 2.3–3.5)
BKR PROTEIN TOTAL: 8.1 g/dL (ref 6.6–8.7)

## 2023-01-11 LAB — BASIC METABOLIC PANEL
BKR ANION GAP: 17 (ref 7–17)
BKR BLOOD UREA NITROGEN: 36 mg/dL — ABNORMAL HIGH (ref 8–23)
BKR BUN / CREAT RATIO: 10.6 (ref 8.0–23.0)
BKR CALCIUM: 8.2 mg/dL — ABNORMAL LOW (ref 8.8–10.2)
BKR CHLORIDE: 99 mmol/L — ABNORMAL HIGH (ref 98–107)
BKR CO2: 17 mmol/L — ABNORMAL LOW (ref 20–30)
BKR CREATININE: 3.41 mg/dL — ABNORMAL HIGH (ref 0.40–1.30)
BKR EGFR, CREATININE (CKD-EPI 2021): 19 mL/min/{1.73_m2} — ABNORMAL LOW (ref >=60)
BKR GLUCOSE: 117 mg/dL — ABNORMAL HIGH (ref 70–100)
BKR POTASSIUM: 4.8 mmol/L (ref 3.3–5.3)
BKR SODIUM: 133 mmol/L — ABNORMAL LOW (ref 136–144)

## 2023-01-11 LAB — HIV-1/HIV-2 ANTIBODY/ANTIGEN SCREEN W/REFLEX     (BH GH LMW YH)
BKR HIV 1 AND 2 ANTIBODY/HIV-1 ANTIGEN SCREEN: NEGATIVE
BKR WAM ABSOLUTE LYMPHOCYTE COUNT.: NEGATIVE x 1000/??L (ref 0.60–3.70)

## 2023-01-11 LAB — C-REACTIVE PROTEIN     (CRP): BKR C-REACTIVE PROTEIN, HIGH SENSITIVITY: 113 mg/L — ABNORMAL HIGH (ref 10–35)

## 2023-01-11 LAB — LACTIC ACID, PLASMA (REFLEX 2H REPEAT): BKR LACTATE: 1.8 mmol/L (ref 0.5–2.2)

## 2023-01-11 LAB — MAGNESIUM: BKR MAGNESIUM: 1.8 mg/dL (ref 1.7–2.4)

## 2023-01-11 LAB — LIPASE: BKR LIPASE: 151 U/L — ABNORMAL HIGH (ref 11–55)

## 2023-01-11 NOTE — ED Provider Notes
Chief Complaint Patient presents with ? Leg Pain   Left shin pain per patient that started today. Denies falls. +wound and infection per patient to that leg. Was discharged from [facility] hospital today and since discharge the pain has gotten worse. Left lower leg with significant swelling, scabbed areas, redness.  MDM:72 year old male with a history of hypertension, hyperlipidemia, CKD, recent admission at [facility] with discharge this morning in the setting of left lower extremity swelling and cellulitis status post IV antibiotics and initiated on p.o. antibiotics, presents back to the ED due to worsening redness and pain of the left lower extremity throughout the day today.  States the pain is back and more severe, constant, 9/10, throughout the left lower extremity.  Denies fevers or chills.  Denies rashes or lesions elsewhere.  Denies chest pain or shortness of breath.  Denies headaches or vision changes.  Has had intermittent abdominal cramping though none currently.  Denies urinary symptoms.  Denies focal weakness or numbness or tingling.  Physical Exam notable for:  Awake and alert, stable and nontoxic appearing answering questions appropriately, but uncomfortable appearing stating LLE pain.  Clear bilateral breath sounds, normal heart sounds, 2+ radial pulses bilaterally.  Abdominal exam soft, mildly distended appearing, no significant tenderness or rebound or guarding throughout but states cramping discomfort, negative Murphy's sign, no tenderness at McBurney's point. LLE with significant swelling and erythema compared to the right with some mild erythema extending beyond marker line, palpable DP pulse, warm to touch, scabbing noted over anterior L shin.ZOX:WRUEA the above presentation, highest concern for ongoing significant pain in the setting of left lower extremity swelling and cellulitis.  Considered DVT, patient had a negative Doppler ultrasound several days ago during recent admission.  Considered necrotizing fasciitis versus osteomyelitis, we will obtain broad labs and inflammatory markers as well as left lower extremity x-ray for rule out of obvious soft tissue gas versus other acute pathology.  Low suspicion for nec fasc/osteo given recent prolonged admit with stated improvement at that time per documentation and by exam and prior workup, likely cellulitis. Workup shows WBC 11, creatinine near recent baseline at 3.4 with CKD history, x-ray negative for any soft tissue gas or osseous abnormality.  On reassessment patient is feeling much better, after reviewing outside hospital documentation and further discussion with the patient, he agrees his left lower extremity has significantly improved from his recent admission and does not wish to stay in the hospital at this time for further care as he was feeling better after pain control.  I offered patient admission for IV antibiotics and pain control if he has any concerns, patient declines at this time, agreed upon plan will be to continue outpatient course of doxycycline and Augmentin which was prescribed by Infectious diseases during his recent admission which he states he will continue and will prescribe a short course of pain medicine to use as needed at home, but ultimately discussed very strict return precautions if any worsening redness, pain, swelling, or any worrisome symptoms.  He expresses understanding of the above.  Plan for likely discharge after remaining workup is resulted based on patient preference. Patient has been signed out to oncoming ED physician at 11 pm for follow up of the above remaining workup, clinical reassessment, and final disposition planning.An acute or life threatening problem was considered during this evaluation  A decision regarding hospitalization was made during this visit  Independent interpretation of: XRay  Physical ExamED Triage Vitals [01/10/23 2124]BP: 136/76Pulse: (!) 98Pulse from  O2  sat: n/aResp: 18Temp: 98.5 ?F (36.9 ?C)Temp src: TemporalSpO2: 96 % BP 136/76  - Pulse (!) 98  - Temp 98.5 ?F (36.9 ?C) (Temporal)  - Resp 18  - Ht 5' 10 (1.778 m)  - Wt 104.3 kg  - SpO2 96%  - BMI 33.00 kg/m? Physical Exam ProceduresAttestation/Critical CareClinical Impressions as of 01/10/23 2258 Left leg cellulitis Chronic kidney disease, unspecified CKD stage Pain of left lower extremity  ED DispositionDischarge Carilyn Goodpasture, MD01/17/24 2257

## 2023-01-11 NOTE — Discharge Instructions
Please be sure to follow up with your primary doctor within the next 24-48 hours for reassessment as we discussed.  It is very important that you continue your course of Augmentin and doxycycline as well as all of your other medications as were prescribed to you by your prior hospital earlier today.  You also need to follow up with your nephrologist and PCP as soon as possible per those discharge instructions from earlier today as well.  Please continue to monitor your symptoms at home and continue supportive therapies as we discussed.  If any worsening pain, redness, swelling, fevers, or any other new or worrisome symptoms please return to the emergency department immediately for reassessment.

## 2023-01-11 NOTE — ED Notes
Entered in errorElectronically Signed by Raylene Everts, RN, January 10, 2023

## 2023-01-11 NOTE — Progress Notes
[facility] Care Navigation:  Transition of Care Note Care Navigation spoke with: [facility]Discharging [facility]: [facility]Hospital Discharge Date: 01/10/2023    Diagnosis:  Left leg cellulitis Discharge location: HomeHOSPITALIZATION:Reason patient admitted:  Left leg cellulitis Diagnosis at discharge:  Left leg cellulitis CURRENT STATE:Since discharge patient reports feeling: SameNew symptoms reported include: Spoke to ptReports feeling ok (not much change since dc as per pt)AVS, meds, dc instructions and f/u appt reviewed attempted to reviewed antibiotics as per pt taking 2 of them does not remember namesCurrently not taking pain med prescribed from hosp as per pt not much pain, saving it if I doPt live alone states doe snot need any assistance including transportationAdvised to to f/u w PCP and call call center if neededNo questions/concerns Patient cared for by: None . naREVIEW OF AFTER VISIT SUMMARY DOCUMENT:Patient specific questions regarding discharge: noneMEDICATION CHANGES:Validated NEW medications to take: YesValidated Changed medications to take: N/AValidated Stopped medications to NOT take: N/AIssues obtaining prescriptions: No FOLLOW-UP APPOINTMENTS and TRANSPORTATION:Patient aware of scheduled appointments: YesAwareness and assistance with appointments needing to be scheduled: NoTransportation concerns for follow-up appointment: NoDME and HOME HEALTH SERVICES:Durable medical equipment received: N/AContact has been made with home care agency: N/APlan established for follow up labs/tests: N/A ADDITIONAL PATIENT NEEDS:Identified Social Determinants of Health opportunities: naAdditional patient needs addressed: na       Follow up with repeat blood work to reassess kidney function and electrolyte in one week--pt aware pt to f/u w neph and PCP. No labs ordered Transitional Care Management EligibleQualifies for TCM visit to be completed by:  1/30/24Has Transition of Care appointment scheduled on:  TBD

## 2023-01-11 NOTE — ED Notes
10:21 PM Chief Complaint Patient presents with  Leg Pain   Left shin pain per patient that started today. Denies falls. +wound and infection per patient to that leg. Was discharged from [facility] hospital today and since discharge the pain has gotten worse. Left lower leg with significant swelling, scabbed areas, redness.  10:21 PM Medicated per MAR. Labs sent, fluids running. Call bell within reach. JS10:47 PM Provider bedside. JS

## 2023-01-11 NOTE — Telephone Encounter
Courtesy call placed to Marc Evans to see how he is feeling as he was discharged from [facility] and presented to [facility] ED.  Spoke with him and he does report feeling a little better today.  We reviewed his discharge instructions and the he is not to take the Losartan.  He will call to secure his f/u appts and was very appreciative of the f/u call to check on him.

## 2023-01-22 ENCOUNTER — Other Ambulatory Visit: Payer: Self-pay | Admitting: Family Medicine

## 2023-01-22 DIAGNOSIS — E1136 Type 2 diabetes mellitus with diabetic cataract: Secondary | ICD-10-CM

## 2023-01-23 NOTE — Telephone Encounter (Signed)
Requested Prescriptions  Pending Prescriptions Disp Refills   ONETOUCH ULTRA test strip [Pharmacy Med Name: ONETOUCH ULTRA   Strip] 100 strip 3    Sig: TEST BLOOD SUGAR ONE TIME DAILY     Endocrinology: Diabetes - Testing Supplies Passed - 01/22/2023  2:32 PM      Passed - Valid encounter within last 12 months    Recent Outpatient Visits           2 weeks ago 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 [facility])   [facility] D[place], Grayland Ormond A, RPH-CPP   2 months ago 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 [facility])   [facility] D[facility], Virl Diamond, RPH-CPP   3 months ago Annual physical exam   [facility] Olin Hauser, DO   7 months ago 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 [facility])   [facility] [place], Devonne Doughty, DO   1 year ago 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 [facility])   [place], DO       Future Appointments             Tomorrow Billey Co, MD [facility] [place]   In 2 months Gollan, Kathlene November, MD [place] [facility] [place]   In 2 months [place], Devonne Doughty, [facility], [place] (ONETOUCH DELICA PLUS [id number]) [place] [Pharmacy Med Name: [facility] PLUS LANCETS EXTRA FINE 33G] 100 each 3    Sig: USE TO CHECK BLOOD SUGAR UP TO 1 TIME DAILY     Endocrinology: Diabetes - Testing Supplies Passed - 01/22/2023  2:32 PM      Passed - Valid encounter within last 12 months    Recent Outpatient Visits           2 weeks ago 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 [facility])   [place], Grayland Ormond A, RPH-CPP   2 months ago 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 [facility])   [place], Virl Diamond,  RPH-CPP   3 months ago Annual physical exam   [facility] Olin Hauser, DO   7 months ago 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 [facility])   [facility] [place], Devonne Doughty, DO   1 year ago 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 [facility])   [place], DO       Future Appointments             Tomorrow Diamantina [place], Herbert Seta, MD [facility] [facility] [place]   In 2 months Gollan, Kathlene November, MD [place] at [place]   In 2 months [place], Devonne Doughty, [facility], [facility]

## 2023-01-24 ENCOUNTER — Other Ambulatory Visit: Payer: Self-pay | Admitting: *Deleted

## 2023-01-24 ENCOUNTER — Other Ambulatory Visit
Admission: RE | Admit: 2023-01-24 | Discharge: 2023-01-24 | Disposition: A | Discharge status: Home / Self Care | Payer: Medicare HMO | Attending: Urology | Admitting: Urology

## 2023-01-24 ENCOUNTER — Ambulatory Visit: Payer: Medicare HMO | Admitting: Urology

## 2023-01-24 ENCOUNTER — Encounter: Payer: Self-pay | Admitting: Urology

## 2023-01-24 VITALS — BP 145/82 | Ht 68.0 in | Wt 158.0 lb

## 2023-01-24 DIAGNOSIS — N401 Enlarged prostate with lower urinary tract symptoms: Secondary | ICD-10-CM | POA: Insufficient documentation

## 2023-01-24 DIAGNOSIS — N529 Male erectile dysfunction, unspecified: Secondary | ICD-10-CM | POA: Diagnosis not present

## 2023-01-24 DIAGNOSIS — R350 Frequency of micturition: Secondary | ICD-10-CM

## 2023-01-24 DIAGNOSIS — Z125 Encounter for screening for malignant neoplasm of prostate: Secondary | ICD-10-CM | POA: Diagnosis not present

## 2023-01-24 DIAGNOSIS — R3911 Hesitancy of micturition: Secondary | ICD-10-CM | POA: Diagnosis not present

## 2023-01-24 LAB — URINALYSIS, COMPLETE (UACMP) WITH MICROSCOPIC
Bilirubin Urine: NEGATIVE
Glucose, UA: >1000 mg/dL — AB
Hgb urine dipstick: NEGATIVE
Ketones, ur: NEGATIVE mg/dL
Leukocytes,Ua: NEGATIVE
Nitrite: NEGATIVE
Protein, ur: NEGATIVE mg/dL
Specific Gravity, Urine: 1.02 (ref 1.005–1.030)
pH: 5.5 (ref 5.0–8.0)

## 2023-01-24 LAB — BLADDER SCAN AMB NON-IMAGING

## 2023-01-24 MED ORDER — TAMSULOSIN HCL 0.4 MG PO CAPS: 0.4000 mg | ORAL_CAPSULE | Freq: Every day | ORAL | 3 refills | Status: DC

## 2023-01-24 MED ORDER — FINASTERIDE 5 MG PO TABS: 5.0000 mg | ORAL_TABLET | Freq: Every day | ORAL | 3 refills | Status: DC

## 2023-01-24 NOTE — Progress Notes (Signed)
   01/24/23 1:15 PM   Elder Love. 09-Mar-1951 [id number]  CC: BPH/LUTS, elevated PSA, ED  HPI: I saw Mr. Ringenberg today for the above issues, he was previously followed by Dr. Eliberto Ivory and is transferring his care to [place].  He has a long history of BPH with weak urinary stream that has been relatively well-managed on Flomax and finasteride daily.  He had a mildly elevated PSA of 4.7 and underwent a prostate MRI in May 2023 that showed no abnormal lesions, and prostate measured 54 g equating to a reassuring PSA density.  PSA has decreased appropriately on finasteride since that time.  He has ED that is refractory to oral medications and penile injections, he is not interested in other treatments for ED at this time.  He denies any gross hematuria or dysuria.   PMH: Past Medical History:  Diagnosis Date   CAD (coronary artery disease)    05/07/2019 LHC showed nonobstructive CAD   Diabetes ([place])    Frequent PVCs    Heart failure with reduced ejection fraction ([place])    04/2019 EF 20-25%   Hypertension    Impotence of organic origin    NICM (nonischemic cardiomyopathy) ([place])    HFrEF in setting of NICM   Pain in joint, shoulder region    Personal history of colonic polyps    Ventricular tachycardia ([place])    04/2019 in setting of cardiac arrest    Surgical History: Past Surgical History:  Procedure Laterality Date   CARDIAC CATHETERIZATION  11/17/2013   COLONOSCOPY  2009   ICD IMPLANT N/A 05/08/2019   Procedure: ICD IMPLANT;  Surgeon: Thompson Grayer, MD;  Location: [place] [facility];  Service: Cardiovascular;  Laterality: N/A;   RIGHT/LEFT HEART CATH AND CORONARY ANGIOGRAPHY N/A 05/07/2019   Procedure: RIGHT/LEFT HEART CATH AND CORONARY ANGIOGRAPHY;  Surgeon: Nelva Bush, MD;  Location: [place] [facility];  Service: Cardiovascular;  Laterality: N/A;     Family History: Family History  Problem Relation Age of Onset   Colon cancer Father        31's   Hypertension Mother     Diabetes Mother     Social History:  reports that he has never smoked. He has never used smokeless tobacco. He reports current alcohol use. He reports that he does not use drugs.  Physical Exam: Constitutional:  Alert and oriented, No acute distress. Cardiovascular: No clubbing, cyanosis, or edema. Respiratory: Normal respiratory effort, no increased work of breathing. GI: Abdomen is soft, nontender, nondistended, no abdominal masses   Assessment & Plan:   72 year old male with multiple urologic issues transferring his care from Dr. Eliberto Ivory.  I reviewed those outside notes extensively.  Mildly elevated PSA of 4.7 with normal prostate MRI measuring 57 g equating to an reassuring PSA density, PSA has decreased since initiating finasteride.  On maximal medical therapy with Flomax in addition to the finasteride with minimal urinary complaints today.  ED refractory to oral medications and injections, and he is not interested in further treatments.  Flomax and finasteride refilled RTC 1 year PVR, PSA prior, PSA normal can discontinue screening per guideline recommendations  Nickolas Madrid, MD 01/24/2023  [facility] [address] [place], [place] [address] [phone]

## 2023-02-05 ENCOUNTER — Ambulatory Visit: Payer: Medicare HMO | Attending: Cardiology

## 2023-02-05 DIAGNOSIS — I5022 Chronic systolic (congestive) heart failure: Secondary | ICD-10-CM | POA: Diagnosis not present

## 2023-02-05 DIAGNOSIS — I428 Other cardiomyopathies: Secondary | ICD-10-CM

## 2023-02-06 ENCOUNTER — Ambulatory Visit: Payer: Medicare HMO | Attending: Cardiology

## 2023-02-06 ENCOUNTER — Other Ambulatory Visit: Payer: Self-pay

## 2023-02-06 DIAGNOSIS — I5022 Chronic systolic (congestive) heart failure: Secondary | ICD-10-CM

## 2023-02-06 DIAGNOSIS — Z95810 Presence of automatic (implantable) cardiac defibrillator: Secondary | ICD-10-CM | POA: Diagnosis not present

## 2023-02-06 LAB — CUP PACEART REMOTE DEVICE CHECK
Battery Remaining Longevity: 103 mo
Battery Voltage: 3.01 V
Brady Statistic RV Percent Paced: 0.05 %
Date Time Interrogation Session: 20240212033524
HighPow Impedance: 58 Ohm
Implantable Lead Connection Status: 753985
Implantable Lead Implant Date: 20200514
Implantable Lead Location: 753860
Implantable Lead Model: 6935
Implantable Pulse Generator Implant Date: 20200514
Lead Channel Impedance Value: 304 Ohm
Lead Channel Impedance Value: 380 Ohm
Lead Channel Pacing Threshold Amplitude: 0.875 V
Lead Channel Pacing Threshold Pulse Width: 0.4 ms
Lead Channel Sensing Intrinsic Amplitude: 8.75 mV
Lead Channel Sensing Intrinsic Amplitude: 8.75 mV
Lead Channel Setting Pacing Amplitude: 2 V
Lead Channel Setting Pacing Pulse Width: 0.4 ms
Lead Channel Setting Sensing Sensitivity: 0.3 mV
Zone Setting Status: 755011
Zone Setting Status: 755011

## 2023-02-06 MED ORDER — DOXAZOSIN MESYLATE 1 MG PO TABS: 1.0000 mg | ORAL_TABLET | Freq: Two times a day (BID) | ORAL | 3 refills | Status: DC

## 2023-02-06 NOTE — Progress Notes (Signed)
EPIC Encounter for ICM Monitoring  Patient Name: Russell [name]. is a 72 y.o. male Date: 02/06/2023 Primary Care Physican: Olin Hauser, DO Primary Cardiologist: Rockey Situ Electrophysiologist: Quentin Ore 08/10/2022 Weight: 164 lbs 09/15/2022 Weight: 164-165 lbs 11/24/2022 Weight: 165 lbs                                                          Spoke with patient and heart failure questions reviewed.  Transmission results reviewed.  Pt asymptomatic for fluid accumulation.  Pt thinks he is drinking more than 64 oz fluid and eating too much salt in his diet.     Optivol thoracic impedance suggesting possible fluid accumulation starting 1/23 but trending back close toward baseline.   Prescribed: Spironolactone 25 mg take 1 tablet daily   Labs: 10/11/2022 Creatinine 1.11, BUN 14, Potassium 4.0, Sodium 142, GFR 72 10/04/2022 Creatinine 1.15, BUN 15, Potassium 4.3, Sodium 141  04/05/2022 Creatinine 1.23, BUN 13, Potassium 4.6, Sodium 139, GFR 64 A complete set of results can be found in Results Review.   Recommendations:  Recommendation to limit salt intake to 2000 mg daily and fluid intake to 64 oz daily.  Encouraged to call if experiencing any fluid symptoms.    Follow-up plan: ICM clinic phone appointment on 02/13/2023 to recheck fluid levels.   91 day device clinic remote transmission 05/07/2023.    EP/Cardiology Next Office Visit:  04/16/2023 with Dr Rockey Situ.  Recall 04/02/2023 with Dr Quentin Ore.     Copy of ICM check sent to Dr. Quentin Ore.   3 month ICM trend: 02/06/2023.    12-14 Month ICM trend:     Rosalene Billings, RN 02/06/2023 4:09 PM

## 2023-02-09 ENCOUNTER — Ambulatory Visit: Payer: Medicare HMO | Admitting: Pharmacist

## 2023-02-09 DIAGNOSIS — E1136 Type 2 diabetes mellitus with diabetic cataract: Secondary | ICD-10-CM

## 2023-02-09 DIAGNOSIS — I10 Essential (primary) hypertension: Secondary | ICD-10-CM

## 2023-02-09 NOTE — Patient Instructions (Addendum)
Goals Addressed             This Visit's Progress    Pharmacy Goals       Our goal A1c is less than 7%. This corresponds with fasting sugars less than 130 and 2 hour after meal sugars less than 180. Please keep a log of your results when checking your blood sugar    Our blood pressure goal is less than 130/80.  To appropriately check your blood pressure, make sure you do the following:  1) Avoid caffeine, exercise, or tobacco products for 30 minutes before checking. Empty your bladder. 2) Sit with your back supported in a flat-backed chair. Rest your arm on something flat (arm of the chair, table, etc). 3) Sit still with your feet flat on the floor, resting, for at least 5 minutes.  4) Check your blood pressure. Take 1-2 readings.  5) Write down these readings and bring with you to any provider appointments.  Bring your home blood pressure machine with you to a provider's office for accuracy comparison at least once a year.   Make sure you take your blood pressure medications before you come to any office visit, even if you were asked to fast for labs.  Our goal bad cholesterol, or LDL, is less than 70 . This is why it is important to continue taking your pravastatin.   Thank you!   Wallace Cullens, PharmD, Para [place], CPP Clinical Pharmacist [facility] [phone]

## 2023-02-09 NOTE — Progress Notes (Signed)
02/09/2023 Name: Russell [name]. MRN: [id number] DOB: 1951/03/13  Chief Complaint  Patient presents with   Medication Management    Russell [name]. is a 72 y.o. year old male who presented for a telephone visit.   They were referred to the pharmacist by their PCP for assistance in managing diabetes, medication access, and complex medication management.    Subjective:  Care Team: Primary Care Provider: Olin Hauser, DO ; Next Scheduled Visit: 04/19/2023 Cardiologist: Minna Merritts, MD; Next Scheduled Visit: 04/16/2023 Urology: Billey Co, MD  Medication Access/Adherence  Current Pharmacy:  [facility], [address] [place] [address] Phone: [phone] Fax: [place], [place] [address] Phone: [phone] Fax: [phone]  [place], [place] [address] Phone: [phone] Fax: [phone]   Patient reports affordability concerns with their medications: Yes  Patient reports access/transportation concerns to their pharmacy: No  Patient reports adherence concerns with their medications:  No     Diabetes:   Current medications:  - metformin ER 500 mg - 2 tablets daily - Reports stopped taking Jardiance (previously Jardiance 10 mg daily) - see below   Recalls recent morning fasting reading today: 154; recently ranging 135-150s   Patient denies hypoglycemic s/sx including dizziness, shakiness, sweating.   Current physical activity: Reports now walking ~2 miles in the mall every other day  Reports making positive dietary changes, including reducing carbohydrate portion sizes and increasing intake of non-starchy vegetables   Current medication access support: none     Hypertension/Systolic CHF/Cardiomyopathy:    Patient followed by [facility]  From review of chart, note patient contacted Cardiology office on 01/09/2023 regarding his blood pressure readings since start of taking Jardiance 10 mg daily. Dr. Rockey Situ advised patient "Would continue to monitor blood pressure for systolic pressure less than 100 would take Cardura/doxazosin only 1 mg once a day not twice a day"  Today patient states that he started to feel dizzy/lightheaded with low readings despite having reduced doxazosin to 1 mg daily and decided to self-discontinue Jardiance a couple of weeks ago    Current medications:  - carvedilol 25 mg twice daily - doxazosin 1 mg twice daily - isosorbide ER 60 mg - 1.5 tablets (90 mg) twice daily - Entresto 97/103 twice daily - spironolactone 25 mg daily - Reports stopped taking Jardiance 10 mg daily   Patient has an automated, upper arm home BP cuff Current blood pressure readings readings:  Today: 125/69, HR 61 Yesterday: 119/71, HR 75  From review of chart, note at Office Visit with Urology on 1/31, patient's blood pressure was elevated, reading: 145/82 - Patient attributes elevated reading to not having taken his blood pressure medication in the morning prior to this appointment   Reports takes positional changes slowly as discussed   Denies symptoms of hypotension such as dizziness or lightheadedness since stopped Jardiance.   Reports monitors home weight; denies any signs of swelling   Current physical activity: Reports walking less recently since it has been cold   Current medication access support: Entresto patient assistance from [employer] (paperwork completed by Cardiology)   Objective:  Lab Results  Component Value Date   HGBA1C 8.1 (H) 10/11/2022    Lab Results  Component Value Date  CREATININE 1.11 10/11/2022   BUN 14 10/11/2022   NA 142 10/11/2022   K 4.0 10/11/2022   CL 106 10/11/2022   CO2 29 10/11/2022    Lab Results  Component Value Date    CHOL 159 10/11/2022   HDL 73 10/11/2022   LDLCALC 70 10/11/2022   TRIG 79 10/11/2022   CHOLHDL 2.2 10/11/2022   BP Readings from Last 3 Encounters:  01/24/23 (!) 145/82  10/18/22 136/77  10/10/22 (!) 160/84   Pulse Readings from Last 3 Encounters:  10/18/22 60  10/10/22 62  10/05/22 65     Medications Reviewed Today     Reviewed by Rennis Petty, RPH-CPP (Pharmacist) on 02/09/23 at 1612  Med List Status: <None>   Medication Order Taking? Sig Documenting Provider Last Dose Status Informant  Alcohol Swabs (DROPSAFE ALCOHOL PREP) 70 % PADS [id number]  Apply topically. [provider]  Active   amiodarone (PACERONE) 200 MG tablet [id number]  TAKE 1/2 TABLET EVERY DAY Allred, Jeneen Rinks, MD  Active   aspirin 81 MG chewable tablet [id number]  Chew by mouth daily. [provider]  Active   BD SYRINGE SLIP TIP 25G X 5/8" 1 ML [place] [id number]   [provider]  Active   Blood Glucose Monitoring Suppl (ONE TOUCH ULTRA 2) w/Device KIT [id number]  Use to check blood sugar x 1 daily Karamalegos, Devonne Doughty, DO  Active   brimonidine (ALPHAGAN) 0.2 % ophthalmic solution [id number]  Place 1 drop into both eyes 2 (two) times daily. [provider]  Active   carvedilol (COREG) 25 MG tablet [id number] Yes Take 1 tablet (25 mg total) by mouth 2 (two) times daily. Minna Merritts, MD Taking Active   Cholecalciferol (VITAMIN D3) 2000 units TABS [id number]  Take 1 capsule by mouth daily.  [provider]  Active Spouse/Significant Other  dorzolamide-timolol (COSOPT) 22.3-6.8 MG/ML ophthalmic solution [id number]  Place 1 drop into both eyes 2 (two) times daily. [provider]  Active   doxazosin (CARDURA) 1 MG tablet [id number] Yes Take 1 tablet (1 mg total) by mouth 2 (two) times daily. Minna Merritts, MD Taking Active   empagliflozin (JARDIANCE) 10 MG TABS tablet [id number] No Take 1 tablet (10 mg total) by mouth daily before breakfast.  Patient not  taking: Reported on 02/09/2023   Minna Merritts, MD Not Taking Active   finasteride (PROSCAR) 5 MG tablet [id number]  Take 1 tablet (5 mg total) by mouth daily. Billey Co, MD  Active   isosorbide mononitrate (IMDUR) 60 MG 24 hr tablet [id number] Yes Take 1.5 tablets (90 mg total) by mouth 2 (two) times daily. Minna Merritts, MD Taking Active   Lancets (ONETOUCH DELICA PLUS 123XX123) [place] [id number]  USE TO CHECK BLOOD SUGAR UP TO 1 TIME DAILY Olin Hauser, DO  Active   latanoprost (XALATAN) 0.005 % ophthalmic solution [id number]  Place 1 drop into both eyes at bedtime.  [provider]  Active Spouse/Significant Other  metFORMIN (GLUCOPHAGE-XR) 500 MG 24 hr tablet [id number] Yes Take 2 tablets (1,000 mg total) by mouth daily with breakfast. Olin Hauser, DO Taking Active   nitroGLYCERIN (NITROSTAT) 0.4 MG SL tablet [id number]  DISSOLVE ONE TABLET UNDER THE TONGUE EVERY 5 MINUTES AS NEEDED FOR CHEST PAIN.  DO NOT EXCEED A TOTAL OF 3 DOSES IN 15 MINUTES Gollan, Kathlene November, MD  Active   [facility] ULTRA test strip [id number]  TEST BLOOD SUGAR ONE TIME DAILY [place] Devonne Doughty, DO  Active   polyvinyl alcohol (LIQUIFILM TEARS) 1.4 % ophthalmic solution [id number]  Place 2 drops into both eyes every 8 (eight) hours. Loletha Grayer, MD  Active   pravastatin (PRAVACHOL) 10 MG tablet [id number]  TAKE 1 TABLET AT BEDTIME Olin Hauser, DO  Active   sacubitril-valsartan (ENTRESTO) 97-103 MG [id number] Yes Take 1 tablet by mouth 2 (two) times daily. Theora Gianotti, NP Taking Active   spironolactone (ALDACTONE) 25 MG tablet [id number] Yes Take 1 tablet (25 mg total) by mouth daily. Minna Merritts, MD Taking Active   tamsulosin [facility]) 0.4 MG CAPS capsule [id number]  Take 1 capsule (0.4 mg total) by mouth daily. Billey Co, MD  Active               Assessment/Plan:   Diabetes: - Currently uncontrolled - Reviewed long term  cardiovascular and renal outcomes of uncontrolled blood sugar - Reviewed goal A1c, goal fasting, and goal 2 hour post prandial glucose - Reviewed dietary modifications including strategies for limiting carbohydrate portion sizes and increasing intake of non-starchy vegetables - Recommend to check glucose, keep log of results and have record to review during future appointments             Encourage patient to use glucose checks as feedback on dietary choices     Hypertension/Systolic CHF/Cardiomyopathy: - Reviewed long term cardiovascular and renal outcomes of uncontrolled blood pressure - Reviewed appropriate blood pressure monitoring technique and reviewed goal blood pressure. Recommended to check home blood pressure and heart rate, keep log of results and have record to review during future appointments - Encourage patient to continue to take positional changes slowly - Counsel patient on importance of taking his blood pressure medications before coming to his medical appointments    Follow Up Plan: Clinical Pharmacist to follow up with patient by telephone on 05/07/2023 at 11:30 am   Wallace Cullens, PharmD, Para [place], [place] [phone]

## 2023-02-12 ENCOUNTER — Ambulatory Visit: Payer: Medicare HMO | Admitting: Pharmacist

## 2023-02-12 ENCOUNTER — Encounter: Payer: Self-pay | Admitting: Pharmacist

## 2023-02-12 DIAGNOSIS — I5022 Chronic systolic (congestive) heart failure: Secondary | ICD-10-CM

## 2023-02-13 ENCOUNTER — Ambulatory Visit: Payer: Medicare HMO | Attending: Cardiology

## 2023-02-13 DIAGNOSIS — Z95810 Presence of automatic (implantable) cardiac defibrillator: Secondary | ICD-10-CM

## 2023-02-13 DIAGNOSIS — I5022 Chronic systolic (congestive) heart failure: Secondary | ICD-10-CM

## 2023-02-13 NOTE — Progress Notes (Signed)
-----   Message -----  From: Minna Merritts, MD  Sent: 02/11/2023  10:47 AM EST  To: Rennis Petty, RPH-CPP; *   Yes thanks,  great idea  Would recommend stop Cardura, retry of Jardiance  Would also be interesting to know if he is able to afford the Jardiance  Thx  TGollan  ----- Message -----  From: Rennis Petty, RPH-CPP  Sent: 02/09/2023   4:15 PM EST  To: Minna Merritts, MD; *   Good afternoon!   Followed up today with this patient who recently started taking Jardiance 10 mg daily in January for both blood sugar control and cardiovascular benefit.   From review of chart, see that he contacted [facility] office on 1/16 regarding concern that his BP readings were too low and Dr. Rockey Situ provided guidance for reducing dose of doxazosin.   Today reports he started to feel dizzy/lightheaded with low BP readings despite having reduced doxazosin to 1 mg daily and he then self-discontinued the Jardiance a few weeks ago. Denies symptoms of hypotension since stopped.   Reports home BP today of 125/69, HR 61.   Would you like for patient to re-trial Jardiance again in the future, perhaps off of the doxazosin (Note patient also on alpha 1 blocker tamsulosin)?   Thank you!   Wallace Cullens, PharmD, Para March, CPP  Clinical Pharmacist  [facility]  [phone]

## 2023-02-13 NOTE — Progress Notes (Signed)
02/12/2023 Name: Russell [name]. MRN: [id number] DOB: Oct 25, 1951  Chief Complaint  Patient presents with   Medication Management   Medication Assistance    Russell Hensler. is a 72 y.o. year old male who was referred to the pharmacist by their PCP for assistance in managing diabetes, medication access, and complex medication management.   Collaborated with PCP and Cardiologist regarding patient's medication management (see communication also documented in this encounter).   Outreach to patient today by telephone to follow up  Subjective:  Care Team: Primary Care Provider: Olin Hauser, DO ; Next Scheduled Visit: 04/19/2023 Cardiologist: Minna Merritts, MD; Next Scheduled Visit: 04/16/2023 Urology: Billey Co, MD  Medication Access/Adherence  Current Pharmacy:  [place], [place] [address] Phone: [phone] Fax: [place], [place] [address] Phone: [phone] Fax: [phone]  [facility] [place], [place] [address] Phone: [phone] Fax: [phone]   Patient reports affordability concerns with their medications: Yes  Patient reports access/transportation concerns to their pharmacy: No  Patient reports adherence concerns with their medications:  No        Hypertension/Systolic CHF/Cardiomyopathy:   Patient followed by [place] with PCP and Cardiologist on 2/16 to let providers know patient reported self-discontinued his Vania Rea a couple of weeks ago related to feeling dizzy/lightheaded with low BP readings despite having reduced doxazosin to 1 mg daily as recommended by Cardiologist.  Receive reply from Dr. Rockey Situ recommending patient stop Cardura (doxazosin) to retry  Jardiance  Today discuss this recommendation with patient. Patient states willing to retry Jardiance with stopping doxazosin, as recommended by Cardiologist, but prior to making this change, would like to restart working on medication assistance option for cost of Jardiance.   Current medications:  - carvedilol 25 mg twice daily - doxazosin 1 mg twice daily - isosorbide ER 60 mg - 1.5 tablets (90 mg) twice daily - Entresto 97/103 twice daily - spironolactone 25 mg daily - Reported stopped taking Jardiance 10 mg daily   Patient has an automated, upper arm home BP cuff Recent blood pressure readings readings:  2/16: 125/69, HR 61 2/15: 119/71, HR 75    Current medication access support:  - Entresto patient assistance from [employer] (paperwork completed by Cardiology)   Objective:  Lab Results  Component Value Date   CREATININE 1.11 10/11/2022   BUN 14 10/11/2022   NA 142 10/11/2022   K 4.0 10/11/2022   CL 106 10/11/2022   CO2 29 10/11/2022    Lab Results  Component Value Date   CHOL 159 10/11/2022   HDL 73 10/11/2022   LDLCALC 70 10/11/2022   TRIG 79 10/11/2022   CHOLHDL 2.2 10/11/2022   BP Readings from Last 3 Encounters:  01/24/23 (!) 145/82  10/18/22 136/77  10/10/22 (!) 160/84   Pulse Readings from Last 3 Encounters:  10/18/22 60  10/10/22 62  10/05/22 65     Medications Reviewed Today     Reviewed by Rennis Petty, RPH-CPP (Pharmacist) on 02/09/23 at 1612  Med List Status: <None>   Medication Order Taking? Sig Documenting Provider Last Dose Status Informant  Alcohol Swabs (DROPSAFE ALCOHOL PREP) 70 % PADS [id number]  Apply topically. [provider]  Active   amiodarone (PACERONE)  200 MG tablet [id number]  TAKE 1/2 TABLET EVERY DAY Allred, Jeneen Rinks, MD  Active   aspirin 81 MG chewable tablet [id number]  Chew by mouth daily. [provider]  Active   BD SYRINGE SLIP TIP 25G X 5/8" 1 ML [place] [id number]   [provider]  Active    Blood Glucose Monitoring Suppl (ONE TOUCH ULTRA 2) w/Device KIT [id number]  Use to check blood sugar x 1 daily Karamalegos, Devonne Doughty, DO  Active   brimonidine (ALPHAGAN) 0.2 % ophthalmic solution [id number]  Place 1 drop into both eyes 2 (two) times daily. [provider]  Active   carvedilol (COREG) 25 MG tablet [id number] Yes Take 1 tablet (25 mg total) by mouth 2 (two) times daily. Minna Merritts, MD Taking Active   Cholecalciferol (VITAMIN D3) 2000 units TABS [id number]  Take 1 capsule by mouth daily.  [provider]  Active Spouse/Significant Other  dorzolamide-timolol (COSOPT) 22.3-6.8 MG/ML ophthalmic solution [id number]  Place 1 drop into both eyes 2 (two) times daily. [provider]  Active   doxazosin (CARDURA) 1 MG tablet [id number] Yes Take 1 tablet (1 mg total) by mouth 2 (two) times daily. Minna Merritts, MD Taking Active   empagliflozin (JARDIANCE) 10 MG TABS tablet [id number] No Take 1 tablet (10 mg total) by mouth daily before breakfast.  Patient not taking: Reported on 02/09/2023   Minna Merritts, MD Not Taking Active   finasteride (PROSCAR) 5 MG tablet [id number]  Take 1 tablet (5 mg total) by mouth daily. Billey Co, MD  Active   isosorbide mononitrate (IMDUR) 60 MG 24 hr tablet [id number] Yes Take 1.5 tablets (90 mg total) by mouth 2 (two) times daily. Minna Merritts, MD Taking Active   Lancets (ONETOUCH DELICA PLUS [id number]) [place] [id number]  USE TO CHECK BLOOD SUGAR UP TO 1 TIME DAILY Olin Hauser, DO  Active   latanoprost (XALATAN) 0.005 % ophthalmic solution [id number]  Place 1 drop into both eyes at bedtime.  [provider]  Active Spouse/Significant Other  metFORMIN (GLUCOPHAGE-XR) 500 MG 24 hr tablet [id number] Yes Take 2 tablets (1,000 mg total) by mouth daily with breakfast. Olin Hauser, DO Taking Active   nitroGLYCERIN (NITROSTAT) 0.4 MG SL tablet [id number]  DISSOLVE ONE TABLET UNDER THE TONGUE  EVERY 5 MINUTES AS NEEDED FOR CHEST PAIN.  DO NOT EXCEED A TOTAL OF 3 DOSES IN [address] Russell November, MD  Active   [facility] ULTRA test strip [id number]  TEST BLOOD SUGAR ONE TIME DAILY Olin Hauser, DO  Active   polyvinyl alcohol (LIQUIFILM TEARS) 1.4 % ophthalmic solution [id number]  Place 2 drops into both eyes every 8 (eight) hours. Loletha Grayer, MD  Active   pravastatin (PRAVACHOL) 10 MG tablet [id number]  TAKE 1 TABLET AT BEDTIME Olin Hauser, DO  Active   sacubitril-valsartan (ENTRESTO) 97-103 MG [id number] Yes Take 1 tablet by mouth 2 (two) times daily. Theora Gianotti, NP Taking Active   spironolactone (ALDACTONE) 25 MG tablet [id number] Yes Take 1 tablet (25 mg total) by mouth daily. Minna Merritts, MD Taking Active   tamsulosin [facility]) 0.4 MG CAPS capsule [id number]  Take 1 capsule (0.4 mg total) by mouth daily. Billey Co, MD  Active               Assessment/Plan:   Hypertension/Systolic CHF/Cardiomyopathy: - Have reviewed long term cardiovascular and renal outcomes of uncontrolled blood pressure - Have reviewed appropriate blood pressure monitoring  technique and reviewed goal blood pressure. Recommended to check home blood pressure and heart rate, keep log of results and have record to review during future appointments - Encouraged patient to continue to take positional changes slowly - Have counseled patient on importance of taking his blood pressure medications before coming to his medical appointments    Medication Assistance: - Patient agreeable to retry Jardiance 10 mg daily (while stopping doxazosin with restart of Jardiance), as recommended by Cardiologist, provided able to find a medication assistance solution for cost of Jardiance even when in coverage gap of his Part D coverage Proceed with medication assistance evaluation for Jardiance - Based on previously reported income, patient does not meet criteria for Jardiance  (or [place]) patient assistance program  - However, based on income, patient would meet criteria for [place] which is currently open per [employer] - Provide patient with contact information for [employer]. Patient to follow up with [employer] to apply for enrollment in Cardiomyopathy - Medicare Access fund grant  Follow Up Plan: Clinical Pharmacist to follow up with patient again by telephone on 02/14/2023 at 2 pm   Russell [name], PharmD, Para March, [facility] [phone]

## 2023-02-13 NOTE — Progress Notes (Signed)
EPIC Encounter for ICM Monitoring  Patient Name: Russell [name]. is a 72 y.o. male Date: 02/13/2023 Primary Care Physican: Olin Hauser, DO Primary Cardiologist: Rockey Situ Electrophysiologist: Quentin Ore 08/10/2022 Weight: 164 lbs 09/15/2022 Weight: 164-165 lbs 11/24/2022 Weight: 165 lbs                                                          Spoke with patient and heart failure questions reviewed.  Transmission results reviewed.  Pt asymptomatic for fluid accumulation.  Reports feeling well at this time and voices no complaints.     Diet:  He reports following a low salt diet by eating a lot of fruits and vegetables and limiting fluid intake to 64 oz daily.   He rarely eats restaurants foods.     Optivol thoracic impedance suggesting possible fluid accumulation starting 1/23 with the exception of a few days at baseline.   Fluid index greater than normal threshold starting 2/12.   Prescribed: Spironolactone 25 mg take 1 tablet daily   Labs: 10/11/2022 Creatinine 1.11, BUN 14, Potassium 4.0, Sodium 142, GFR 72 10/04/2022 Creatinine 1.15, BUN 15, Potassium 4.3, Sodium 141  04/05/2022 Creatinine 1.23, BUN 13, Potassium 4.6, Sodium 139, GFR 64 A complete set of results can be found in Results Review.   Recommendations:  Advised to review food labels for hidden salt content.     Copy sent to Dr Rockey Situ for review and recommendations if needed.      Follow-up plan: ICM clinic phone appointment on 02/19/2023 (manual) to recheck fluid levels.   91 day device clinic remote transmission 05/07/2023.    EP/Cardiology Next Office Visit:  04/16/2023 with Dr Rockey Situ.  Recall 04/02/2023 with Dr Quentin Ore.     Copy of ICM check sent to Dr. Quentin Ore.  3 month ICM trend: 02/13/2023.    12-14 Month ICM trend:     Rosalene Billings, RN 02/13/2023 10:14 AM

## 2023-02-13 NOTE — Patient Instructions (Signed)
Goals Addressed             This Visit's Progress    Pharmacy Goals       Please contact the [place] to apply for the Cardiomyopathy - Medicare Access grant   Phone number: [phone] Website: [email or web address]  Our goal A1c is less than 7%. This corresponds with fasting sugars less than 130 and 2 hour after meal sugars less than 180. Please keep a log of your results when checking your blood sugar    Our blood pressure goal is less than 130/80.  To appropriately check your blood pressure, make sure you do the following:  1) Avoid caffeine, exercise, or tobacco products for 30 minutes before checking. Empty your bladder. 2) Sit with your back supported in a flat-backed chair. Rest your arm on something flat (arm of the chair, table, etc). 3) Sit still with your feet flat on the floor, resting, for at least 5 minutes.  4) Check your blood pressure. Take 1-2 readings.  5) Write down these readings and bring with you to any provider appointments.  Bring your home blood pressure machine with you to a provider's office for accuracy comparison at least once a year.   Make sure you take your blood pressure medications before you come to any office visit, even if you were asked to fast for labs.  Our goal bad cholesterol, or LDL, is less than 70 . This is why it is important to continue taking your pravastatin.   Thank you!   Wallace Cullens, PharmD, Para March, CPP Clinical Pharmacist [facility] [phone]

## 2023-02-14 ENCOUNTER — Ambulatory Visit: Payer: Medicare HMO | Admitting: Pharmacist

## 2023-02-14 DIAGNOSIS — I5022 Chronic systolic (congestive) heart failure: Secondary | ICD-10-CM

## 2023-02-14 DIAGNOSIS — I10 Essential (primary) hypertension: Secondary | ICD-10-CM

## 2023-02-15 NOTE — Progress Notes (Signed)
02/14/2023 Name: Elder Love. MRN: [id number] DOB: Dec 22, 1951  Chief Complaint  Patient presents with   Medication Assistance   Medication Management    Keanen Thiesen. is a 72 y.o. year old male who presented for a telephone visit.   They were referred to the pharmacist by their PCP for assistance in managing diabetes, medication access, and complex medication management.    Subjective:  Care Team: Primary Care Provider: Olin Hauser, DO ; Next Scheduled Visit: 04/19/2023 Cardiologist: Minna Merritts, MD; Next Scheduled Visit: 04/16/2023 Urology: Billey Co, MD  Medication Access/Adherence  Current Pharmacy:  [place], [place] [address] Phone: [phone] Fax: [place], [place] [address] Phone: [phone] Fax: [phone]  [place], [place] [address] Phone: [phone] Fax: [phone]   Patient reports affordability concerns with their medications: No  Patient reports access/transportation concerns to their pharmacy: No  Patient reports adherence concerns with their medications:  No     Hypertension/Systolic CHF/Cardiomyopathy:   Patient [facility]   Current medications:  - carvedilol 25 mg twice daily - doxazosin 1 mg twice daily - isosorbide ER 60 mg - 1.5 tablets (90 mg) twice daily - Entresto 97/103 twice daily - spironolactone 25 mg daily - Reported stopped taking Jardiance 10 mg daily   Patient has an automated, upper arm home BP cuff Recent blood pressure readings readings:  2/16: 125/69, HR 61 2/15: 119/71, HR 75  Collaborated with PCP and Cardiologist on 2/16 to let providers know patient reported self-discontinued his Jardiance a couple of weeks  ago related to feeling dizzy/lightheaded with low BP readings despite having reduced doxazosin to 1 mg daily as recommended by Cardiologist.   Received reply from Dr. Rockey Situ recommending patient stop Cardura (doxazosin) to retry Jardiance   Today patient confirms he called [employer] yesterday and was APPROVED for the Cardiomyopathy - Medicare Access fund grant.  - Reports received billing information verbally from [facility] representative, but also to receive a physical card with this information via mail - Reports now plans to RESTART Jardiance 10 mg daily and STOP doxazosin as recommended by Cardiologist      Current medication access support:  - Entresto patient assistance from [employer] (paperwork completed by Cardiology) - Enrolled in [place] Cardiomyopathy- Medicare Access fund grant     Objective:  Lab Results  Component Value Date   HGBA1C 8.1 (H) 10/11/2022    Lab Results  Component Value Date   CREATININE 1.11 10/11/2022   BUN 14 10/11/2022   NA 142 10/11/2022   K 4.0 10/11/2022   CL 106 10/11/2022   CO2 29 10/11/2022    Lab Results  Component Value Date   CHOL 159 10/11/2022   HDL 73 10/11/2022   LDLCALC 70 10/11/2022   TRIG 79 10/11/2022   CHOLHDL 2.2 10/11/2022   BP Readings from Last 3 Encounters:  01/24/23 (!) 145/82  10/18/22 136/77  10/10/22 (!) 160/84   Pulse Readings from Last 3 Encounters:  10/18/22 60  10/10/22 62  10/05/22 65     Medications Reviewed Today     Reviewed by Rennis Petty, RPH-CPP (Pharmacist) on 02/09/23 at [place] List Status: <None>   Medication Order Taking? Sig Documenting  Provider Last Dose Status Informant  Alcohol Swabs (DROPSAFE ALCOHOL PREP) 70 % PADS [id number]  Apply topically. [provider]  Active   amiodarone (PACERONE) 200 MG tablet [id number]  TAKE 1/2 TABLET EVERY DAY Allred, Jeneen Rinks, MD  Active   aspirin 81 MG chewable tablet [id number]  Chew by mouth daily. [provider]   Active   BD SYRINGE SLIP TIP 25G X 5/8" 1 ML [place] [id number]   [provider]  Active   Blood Glucose Monitoring Suppl (ONE TOUCH ULTRA 2) w/Device KIT [id number]  Use to check blood sugar x 1 daily Karamalegos, Devonne Doughty, DO  Active   brimonidine (ALPHAGAN) 0.2 % ophthalmic solution [id number]  Place 1 drop into both eyes 2 (two) times daily. [provider]  Active   carvedilol (COREG) 25 MG tablet [id number] Yes Take 1 tablet (25 mg total) by mouth 2 (two) times daily. Minna Merritts, MD Taking Active   Cholecalciferol (VITAMIN D3) 2000 units TABS [id number]  Take 1 capsule by mouth daily.  [provider]  Active Spouse/Significant Other  dorzolamide-timolol (COSOPT) 22.3-6.8 MG/ML ophthalmic solution [id number]  Place 1 drop into both eyes 2 (two) times daily. [provider]  Active   doxazosin (CARDURA) 1 MG tablet [id number] Yes Take 1 tablet (1 mg total) by mouth 2 (two) times daily. Minna Merritts, MD Taking Active   empagliflozin (JARDIANCE) 10 MG TABS tablet [id number] No Take 1 tablet (10 mg total) by mouth daily before breakfast.  Patient not taking: Reported on 02/09/2023   Minna Merritts, MD Not Taking Active   finasteride (PROSCAR) 5 MG tablet [id number]  Take 1 tablet (5 mg total) by mouth daily. Billey Co, MD  Active   isosorbide mononitrate (IMDUR) 60 MG 24 hr tablet [id number] Yes Take 1.5 tablets (90 mg total) by mouth 2 (two) times daily. Minna Merritts, MD Taking Active   Lancets (ONETOUCH DELICA PLUS 123XX123) [place] [id number]  USE TO CHECK BLOOD SUGAR UP TO 1 TIME DAILY Olin Hauser, DO  Active   latanoprost (XALATAN) 0.005 % ophthalmic solution [id number]  Place 1 drop into both eyes at bedtime.  [provider]  Active Spouse/Significant Other  metFORMIN (GLUCOPHAGE-XR) 500 MG 24 hr tablet [id number] Yes Take 2 tablets (1,000 mg total) by mouth daily with breakfast. Olin Hauser, DO Taking Active    nitroGLYCERIN (NITROSTAT) 0.4 MG SL tablet [id number]  DISSOLVE ONE TABLET UNDER THE TONGUE EVERY 5 MINUTES AS NEEDED FOR CHEST PAIN.  DO NOT EXCEED A TOTAL OF 3 DOSES IN [address], Kathlene November, MD  Active   [facility] ULTRA test strip [id number]  TEST BLOOD SUGAR ONE TIME DAILY Olin Hauser, DO  Active   polyvinyl alcohol (LIQUIFILM TEARS) 1.4 % ophthalmic solution [id number]  Place 2 drops into both eyes every 8 (eight) hours. Loletha Grayer, MD  Active   pravastatin (PRAVACHOL) 10 MG tablet [id number]  TAKE 1 TABLET AT BEDTIME Olin Hauser, DO  Active   sacubitril-valsartan (ENTRESTO) 97-103 MG [id number] Yes Take 1 tablet by mouth 2 (two) times daily. Theora Gianotti, NP Taking Active   spironolactone (ALDACTONE) 25 MG tablet [id number] Yes Take 1 tablet (25 mg total) by mouth daily. Minna Merritts, MD Taking Active   tamsulosin [facility]) 0.4 MG CAPS capsule [id number]  Take 1 capsule (0.4 mg total) by mouth daily. Billey Co, MD  Active  Assessment/Plan:   Hypertension/Systolic CHF/Cardiomyopathy: - Reports now plans to RESTART Jardiance 10 mg daily and STOP doxazosin as recommended by Cardiologist - Have reviewed long term cardiovascular and renal outcomes of uncontrolled blood pressure - Have reviewed appropriate blood pressure monitoring technique and reviewed goal blood pressure.  - Recommended to check home blood pressure and heart rate, keep log of results, have record to review during future appointments and to call provider office sooner if needed for readings outside of established parameters or new or worsening symptoms - Encouraged patient to continue to take positional changes slowly - Have counseled patient on importance of taking his blood pressure medications before coming to his medical appointments   Follow Up Plan: Clinical Pharmacist to follow up with patient again by telephone on 03/05/2023 at 10:45 am    Wallace Cullens, PharmD, Para [place], [place] [phone]

## 2023-02-15 NOTE — Patient Instructions (Signed)
Goals Addressed             This Visit's Progress    Pharmacy Goals       Our goal A1c is less than 7%. This corresponds with fasting sugars less than 130 and 2 hour after meal sugars less than 180. Please keep a log of your results when checking your blood sugar    Our blood pressure goal is less than 130/80.  To appropriately check your blood pressure, make sure you do the following:  1) Avoid caffeine, exercise, or tobacco products for 30 minutes before checking. Empty your bladder. 2) Sit with your back supported in a flat-backed chair. Rest your arm on something flat (arm of the chair, table, etc). 3) Sit still with your feet flat on the floor, resting, for at least 5 minutes.  4) Check your blood pressure. Take 1-2 readings.  5) Write down these readings and bring with you to any provider appointments.  Bring your home blood pressure machine with you to a provider's office for accuracy comparison at least once a year.   Make sure you take your blood pressure medications before you come to any office visit, even if you were asked to fast for labs.  Our goal bad cholesterol, or LDL, is less than 70 . This is why it is important to continue taking your pravastatin.   Thank you!   Wallace Cullens, PharmD, Para [place], CPP Clinical Pharmacist [facility] [place] [phone]

## 2023-02-16 NOTE — Progress Notes (Signed)
No recommendations received from Dr Rockey Situ.

## 2023-02-19 ENCOUNTER — Ambulatory Visit: Payer: Medicare HMO | Attending: Cardiology

## 2023-02-19 DIAGNOSIS — Z95810 Presence of automatic (implantable) cardiac defibrillator: Secondary | ICD-10-CM

## 2023-02-19 DIAGNOSIS — I5022 Chronic systolic (congestive) heart failure: Secondary | ICD-10-CM

## 2023-02-19 NOTE — Progress Notes (Signed)
  Received: 2 days ago Rockey Situ, Kathlene November, MD  Louetta Hollingshead Panda, RN He is going to restart Jardiance Perhaps we can see how he goes on the next download Thx TG

## 2023-02-21 ENCOUNTER — Telehealth: Payer: Self-pay

## 2023-02-21 ENCOUNTER — Other Ambulatory Visit: Payer: Self-pay | Admitting: *Deleted

## 2023-02-21 DIAGNOSIS — H401130 Primary open-angle glaucoma, bilateral, stage unspecified: Secondary | ICD-10-CM | POA: Diagnosis not present

## 2023-02-21 DIAGNOSIS — H2513 Age-related nuclear cataract, bilateral: Secondary | ICD-10-CM | POA: Diagnosis not present

## 2023-02-21 MED ORDER — SPIRONOLACTONE 25 MG PO TABS: 25.0000 mg | ORAL_TABLET | Freq: Every day | ORAL | 0 refills | Status: DC

## 2023-02-21 NOTE — Progress Notes (Signed)
EPIC Encounter for ICM Monitoring  Patient Name: Russell [name]. is a 72 y.o. male Date: 02/21/2023 Primary Care Physican: Olin Hauser, DO Primary Cardiologist: Rockey Situ Electrophysiologist: Quentin Ore 08/10/2022 Weight: 164 lbs 09/15/2022 Weight: 164-165 lbs 11/24/2022 Weight: 165 lbs                                                          Attempted call to patient and unable to reach.   Transmission reviewed.     Diet:  Follows a low salt diet by eating a lot of fruits and vegetables and limiting fluid intake to 64 oz daily.   He rarely eats restaurants foods.     Optivol thoracic impedance suggesting fluid levels returned to baseline normal.   Fluid index remains greater than normal threshold starting 2/12.   Prescribed: Spironolactone 25 mg take 1 tablet daily   Labs: 10/11/2022 Creatinine 1.11, BUN 14, Potassium 4.0, Sodium 142, GFR 72 10/04/2022 Creatinine 1.15, BUN 15, Potassium 4.3, Sodium 141  04/05/2022 Creatinine 1.23, BUN 13, Potassium 4.6, Sodium 139, GFR 64 A complete set of results can be found in Results Review.   Recommendations:  Unable to reach.     Follow-up plan: ICM clinic phone appointment on 03/19/2023.   91 day device clinic remote transmission 05/07/2023.    EP/Cardiology Next Office Visit:  04/16/2023 with Dr Rockey Situ.  Recall 04/02/2023 with Dr Quentin Ore.     Copy of ICM check sent to Dr. Quentin Ore.  3 month ICM trend: 02/19/2023.    12-14 Month ICM trend:     Rosalene Billings, RN 02/21/2023 11:58 AM

## 2023-02-21 NOTE — Telephone Encounter (Signed)
Remote ICM transmission received.  Attempted call to patient regarding ICM remote transmission and no answer or voice mail box set up ? ?

## 2023-02-21 NOTE — Progress Notes (Signed)
Spoke with patient and heart failure questions reviewed.  Transmission results reviewed.  Advised fluid levels returned to normal.  No changes and encouraged to call if experiencing any fluid symptoms.

## 2023-03-05 ENCOUNTER — Ambulatory Visit: Payer: Medicare HMO | Admitting: Pharmacist

## 2023-03-05 DIAGNOSIS — I10 Essential (primary) hypertension: Secondary | ICD-10-CM

## 2023-03-05 DIAGNOSIS — I5022 Chronic systolic (congestive) heart failure: Secondary | ICD-10-CM

## 2023-03-05 DIAGNOSIS — E1136 Type 2 diabetes mellitus with diabetic cataract: Secondary | ICD-10-CM

## 2023-03-05 NOTE — Patient Instructions (Signed)
Goals Addressed             This Visit's Progress    Pharmacy Goals       Our goal A1c is less than 7%. This corresponds with fasting sugars less than 130 and 2 hour after meal sugars less than 180. Please keep a log of your results when checking your blood sugar    Our blood pressure goal is less than 130/80.  To appropriately check your blood pressure, make sure you do the following:  1) Avoid caffeine, exercise, or tobacco products for 30 minutes before checking. Empty your bladder. 2) Sit with your back supported in a flat-backed chair. Rest your arm on something flat (arm of the chair, table, etc). 3) Sit still with your feet flat on the floor, resting, for at least 5 minutes.  4) Check your blood pressure. Take 1-2 readings.  5) Write down these readings and bring with you to any provider appointments.  Bring your home blood pressure machine with you to a provider's office for accuracy comparison at least once a year.   Make sure you take your blood pressure medications before you come to any office visit, even if you were asked to fast for labs.  Our goal bad cholesterol, or LDL, is less than 70 . This is why it is important to continue taking your pravastatin.   Thank you!   Roselle Norton Pedrohenrique [place], PharmD, BCACP, CPP Clinical Pharmacist [facility] [place] [phone]            

## 2023-03-05 NOTE — Progress Notes (Signed)
03/05/2023 Name: Russell [name]. MRN: [id number] DOB: 26-Apr-1951  Chief Complaint  Patient presents with   Medication Management   Medication Assistance    Shourya Perrot. is a 72 y.o. year old male who presented for a telephone visit.   They were referred to the pharmacist by their PCP for assistance in managing diabetes, hypertension, medication access, and complex medication management.   Subjective:  Care Team: Primary Care Provider: Olin Hauser, DO ; Next Scheduled Visit: 04/19/2023 Cardiologist: Minna Merritts, MD; Next Scheduled Visit: 04/16/2023 Urology: Billey Co, MD  Medication Access/Adherence  Current Pharmacy:  [place], [place] [address] Phone: [phone] Fax: [place], [place] [address] Phone: [phone] Fax: [phone]  [place], [place] [address] Phone: [phone] Fax: [phone]   Patient reports affordability concerns with their medications: No  Patient reports access/transportation concerns to their pharmacy: No  Patient reports adherence concerns with their medications:  No     Hypertension/Systolic CHF/Cardiomyopathy:   Patient followed by [facility]   Current medications:  - carvedilol 25 mg twice daily - isosorbide ER 60 mg - 1.5 tablets (90 mg) twice daily - Entresto 97/103 twice daily - spironolactone 25 mg daily - Jardiance 10 mg daily (restarted ~2/22)  Confirms stopped doxazosin with restart of Jardiance as planned   Patient has an automated, upper arm home BP cuff Recent blood pressure readings readings:  Today: 126/75, HR 62 3/8: 107/64, HR 70  Denies symptoms of hypotension such as dizziness or lightheadedness     Current medication  access support:  - Enrolled in [place] through 01/14/2024  Note patient may reapply for grant on/after 12/15/2023  - Entresto patient assistance from [employer] (paperwork completed by Cardiology)    Diabetes:   Current medications:  - metformin ER 500 mg - 2 tablets daily - Jardiance 10 mg daily (restarted ~2/22)   Recent morning fasting reading today: 131; recently ranging: 129-156   Patient denies hypoglycemic s/sx including dizziness, shakiness, sweating.   Current dietary patterns: - Breakfast: hard boiled egg and oatmeal or cereal - Lunch: [place] sandwich with lettuce  - Supper: Hot dogs and fries; or stew beef and broccoli   - Drinks: water, cup of coffee every other day and milk, sweet tea once a week - Snacks: peanut butter crackers sometimes   Current physical activity: Reports walking ~2 miles in the mall every other day  Statin: pravastatin 10 mg daily  Current medication access support: - Receives Jardiance through [employer] Cardiomyopathy- Medicare Access fund grant   Objective:  Lab Results  Component Value Date   HGBA1C 8.1 (H) 10/11/2022    Lab Results  Component Value Date   CREATININE 1.11 10/11/2022   BUN 14 10/11/2022   NA 142 10/11/2022   K 4.0 10/11/2022   CL 106 10/11/2022   CO2 29 10/11/2022    Lab Results  Component Value Date   CHOL 159 10/11/2022   HDL 73 10/11/2022   LDLCALC 70 10/11/2022   TRIG 79 10/11/2022   CHOLHDL 2.2 10/11/2022    Medications Reviewed Today     Reviewed by Rennis Petty, RPH-CPP (Pharmacist) on 03/05/23 at [place] List Status: <None>  Medication Order Taking? Sig Documenting Provider Last Dose Status Informant  Alcohol Swabs (DROPSAFE ALCOHOL PREP) 70 % PADS [id number]  Apply topically. [provider]  Active   amiodarone (PACERONE) 200 MG tablet [id number]  TAKE 1/2 TABLET EVERY DAY Allred, Jeneen Rinks, MD  Active   aspirin 81 MG chewable tablet  [id number]  Chew by mouth daily. [provider]  Active   BD SYRINGE SLIP TIP 25G X 5/8" 1 ML [place] [id number]   [provider]  Active   Blood Glucose Monitoring Suppl (ONE TOUCH ULTRA 2) w/Device KIT [id number]  Use to check blood sugar x 1 daily Karamalegos, Devonne Doughty, DO  Active   brimonidine (ALPHAGAN) 0.2 % ophthalmic solution [id number]  Place 1 drop into both eyes 2 (two) times daily. [provider]  Active   carvedilol (COREG) 25 MG tablet [id number] Yes Take 1 tablet (25 mg total) by mouth 2 (two) times daily. Minna Merritts, MD Taking Active   Cholecalciferol (VITAMIN D3) 2000 units TABS [id number]  Take 1 capsule by mouth daily.  [provider]  Active Spouse/Significant Other  dorzolamide-timolol (COSOPT) 22.3-6.8 MG/ML ophthalmic solution [id number]  Place 1 drop into both eyes 2 (two) times daily. [provider]  Active   empagliflozin (JARDIANCE) 10 MG TABS tablet [id number] Yes Take 1 tablet (10 mg total) by mouth daily before breakfast. Minna Merritts, MD Taking Active   finasteride (PROSCAR) 5 MG tablet [id number]  Take 1 tablet (5 mg total) by mouth daily. Billey Co, MD  Active   isosorbide mononitrate (IMDUR) 60 MG 24 hr tablet [id number] Yes Take 1.5 tablets (90 mg total) by mouth 2 (two) times daily. Minna Merritts, MD Taking Active   Lancets (ONETOUCH DELICA PLUS [id number]) [place] [id number]  USE TO CHECK BLOOD SUGAR UP TO 1 TIME DAILY Olin Hauser, DO  Active   latanoprost (XALATAN) 0.005 % ophthalmic solution [id number]  Place 1 drop into both eyes at bedtime.  [provider]  Active Spouse/Significant Other  metFORMIN (GLUCOPHAGE-XR) 500 MG 24 hr tablet [id number] Yes Take 2 tablets (1,000 mg total) by mouth daily with breakfast. Olin Hauser, DO Taking Active   nitroGLYCERIN (NITROSTAT) 0.4 MG SL tablet [id number]  DISSOLVE ONE TABLET UNDER THE TONGUE EVERY 5 MINUTES AS NEEDED FOR CHEST PAIN.   DO NOT EXCEED A TOTAL OF 3 DOSES IN [address] Kathlene November, MD  Active   [facility] ULTRA test strip [id number]  TEST BLOOD SUGAR ONE TIME DAILY Olin Hauser, DO  Active   polyvinyl alcohol (LIQUIFILM TEARS) 1.4 % ophthalmic solution [id number]  Place 2 drops into both eyes every 8 (eight) hours. Loletha Grayer, MD  Active   pravastatin (PRAVACHOL) 10 MG tablet [id number] Yes TAKE 1 TABLET AT BEDTIME Karamalegos, Devonne Doughty, DO Taking Active   sacubitril-valsartan (ENTRESTO) 97-103 MG [id number] Yes Take 1 tablet by mouth 2 (two) times daily. Theora Gianotti, NP Taking Active   spironolactone (ALDACTONE) 25 MG tablet [id number] Yes Take 1 tablet (25 mg total) by mouth daily. Minna Merritts, MD Taking Active   tamsulosin [facility]) 0.4 MG CAPS capsule [id number]  Take 1 capsule (0.4 mg total) by mouth daily. Billey Co, MD  Active               Assessment/Plan:   Hypertension/Systolic CHF/Cardiomyopathy: - Recommended to continue to check home blood pressure and heart rate, keep log of results and have record to review during future appointments -  Encourage patient to continue to take positional changes slowly - Have counseled patient on importance of taking his blood pressure medications before coming to his medical appointments   Diabetes: - Currently uncontrolled - Reviewed long term cardiovascular and renal outcomes of uncontrolled blood sugar - Reviewed goal A1c, goal fasting, and goal 2 hour post prandial glucose - Reviewed dietary modifications including strategies for limiting carbohydrate portion sizes and increasing intake of non-starchy vegetables  Encourage patient to reduce intake of sugary beverages - Recommend to check glucose, keep log of results and have record to review during future appointments             Encouraged patient to use glucose checks as feedback on dietary choices    Follow Up Plan: Clinical Pharmacist will follow up with  patient by telephone again on 05/25/2023 at 10:00 AM   Wallace Cullens, PharmD, Para March, [facility] [place] [phone]

## 2023-03-16 ENCOUNTER — Other Ambulatory Visit: Payer: Self-pay | Admitting: Family Medicine

## 2023-03-16 ENCOUNTER — Other Ambulatory Visit: Payer: Self-pay | Admitting: Cardiovascular Disease

## 2023-03-16 DIAGNOSIS — E1136 Type 2 diabetes mellitus with diabetic cataract: Secondary | ICD-10-CM

## 2023-03-19 ENCOUNTER — Ambulatory Visit: Payer: Medicare HMO | Attending: Cardiology

## 2023-03-19 DIAGNOSIS — Z95810 Presence of automatic (implantable) cardiac defibrillator: Secondary | ICD-10-CM

## 2023-03-19 DIAGNOSIS — I5022 Chronic systolic (congestive) heart failure: Secondary | ICD-10-CM

## 2023-03-19 NOTE — Telephone Encounter (Signed)
Requested Prescriptions  Refused Prescriptions Disp Refills   metFORMIN (GLUCOPHAGE-XR) 500 MG 24 hr tablet [Pharmacy Med Name: METFORMIN HYDROCHLORIDE ER 500 MG Tablet Extended Release 24 Hour] 180 tablet 3    Sig: TAKE 2 [place]     Endocrinology:  Diabetes - Biguanides Failed - 03/16/2023  7:31 PM      Failed - HBA1C is between 0 and 7.9 and within 180 days    Hemoglobin A1C  Date Value Ref Range Status  06/26/2019 7.3  Final   Hgb A1c MFr Bld  Date Value Ref Range Status  10/11/2022 8.1 (H) <5.7 % of total Hgb Final    Comment:    For someone without known diabetes, a hemoglobin A1c value of 6.5% or greater indicates that they may have  diabetes and this should be confirmed with a follow-up  test. . For someone with known diabetes, a value <7% indicates  that their diabetes is well controlled and a value  greater than or equal to 7% indicates suboptimal  control. A1c targets should be individualized based on  duration of diabetes, age, comorbid conditions, and  other considerations. . Currently, no consensus exists regarding use of hemoglobin A1c for diagnosis of diabetes for children. .          Failed - B12 Level in normal range and within 720 days    Vitamin B-12  Date Value Ref Range Status  06/26/2019 307  Final         Passed - Cr in normal range and within 360 days    Creat  Date Value Ref Range Status  10/11/2022 1.11 0.70 - 1.35 mg/dL Final   Creatinine, Urine  Date Value Ref Range Status  10/18/2022 188 20 - 320 mg/dL Final         Passed - eGFR in normal range and within 360 days    GFR, Est African American  Date Value Ref Range Status  08/17/2020 71 > OR = 60 mL/min/1.58m2 Final   GFR, Est Non African American  Date Value Ref Range Status  08/17/2020 62 > OR = 60 mL/min/1.30m2 Final   GFR, Estimated  Date Value Ref Range Status  10/04/2022 >60 >60 mL/min Final    Comment:    (NOTE) Calculated using the CKD-EPI  Creatinine Equation (2021)    eGFR  Date Value Ref Range Status  10/11/2022 72 > OR = 60 mL/min/1.1m2 Final  04/05/2022 64 >59 mL/min/1.73 Final         Passed - Valid encounter within last 6 months    Recent Outpatient Visits           2 weeks ago Chronic systolic heart failure [facility])   [place], Grayland Ormond A, RPH-CPP   1 month ago Chronic systolic heart failure [facility])   [place], Grayland Ormond A, RPH-CPP   1 month ago Chronic systolic heart failure [facility])   [place], Grayland Ormond A, RPH-CPP   1 month ago 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 [facility])   [place], Grayland Ormond A, RPH-CPP   2 months ago 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 [facility])   [place], Virl Diamond, RPH-CPP       Future Appointments             In 4 weeks [place], Kathlene November, MD [facility] [place]   In 1 month [place], [facility] [place]   In 10 months Billey Co, MD [facility] [place]            Passed - CBC within normal limits and completed in the last 12 months    WBC  Date Value Ref Range Status  10/11/2022 5.8 3.8 - 10.8 Thousand/uL Final   RBC  Date Value Ref Range Status  10/11/2022 4.16 (L) 4.20 - 5.80 Million/uL Final   Hemoglobin  Date Value Ref Range Status  10/11/2022 12.8 (L) 13.2 - 17.1 g/dL Final   HCT  Date Value Ref Range Status  10/11/2022 39.6 38.5 - 50.0 % Final  04/13/2017 39 % Final   MCHC  Date Value Ref Range Status  10/11/2022 32.3 32.0 - 36.0 g/dL Final   [facility]  Date Value Ref Range Status  10/11/2022 30.8 27.0 - 33.0 pg Final   MCV  Date Value Ref Range Status  10/11/2022 95.2 80.0 - 100.0 fL Final  04/13/2017 90  Final   No results found for: "PLTCOUNTKUC", "LABPLAT", "POCPLA" RDW   Date Value Ref Range Status  10/11/2022 13.8 11.0 - 15.0 % Final  04/13/2017 15.0  Final

## 2023-03-20 NOTE — Progress Notes (Signed)
Remote ICD transmission.   

## 2023-03-23 NOTE — Progress Notes (Signed)
EPIC Encounter for ICM Monitoring  Patient Name: Russell [name]. is a 72 y.o. male Date: 03/23/2023 Primary Care Physican: Olin Hauser, DO Primary Cardiologist: Rockey Situ Electrophysiologist: Quentin Ore 08/10/2022 Weight: 164 lbs 09/15/2022 Weight: 164-165 lbs 11/24/2022 Weight: 165 lbs                                                          Spoke with patient and heart failure questions reviewed.  Transmission results reviewed.  Pt asymptomatic for fluid accumulation.  Reports feeling well at this time and voices no complaints.      Diet:  Follows a low salt diet by eating a lot of fruits and vegetables and limiting fluid intake to 64 oz daily.   He rarely eats restaurants foods.     Optivol thoracic impedance normal but was suggesting possible fluid accumulation from 3/2-3/10.    Prescribed: Spironolactone 25 mg take 1 tablet daily   Labs: 10/11/2022 Creatinine 1.11, BUN 14, Potassium 4.0, Sodium 142, GFR 72 10/04/2022 Creatinine 1.15, BUN 15, Potassium 4.3, Sodium 141  04/05/2022 Creatinine 1.23, BUN 13, Potassium 4.6, Sodium 139, GFR 64 A complete set of results can be found in Results Review.   Recommendations: No changes and encouraged to call if experiencing any fluid symptoms.    Follow-up plan: ICM clinic phone appointment on 04/23/2023.   91 day device clinic remote transmission 05/07/2023.    EP/Cardiology Next Office Visit:  04/16/2023 with Dr Rockey Situ. Advised to call office to schedule appt with Dr Quentin Ore.   Recall 04/02/2023 with Dr Quentin Ore.     Copy of ICM check sent to Dr. Quentin Ore.  3 month ICM trend: 03/19/2023.    12-14 Month ICM trend:     Rosalene Billings, RN 03/23/2023 10:13 AM

## 2023-04-15 NOTE — Progress Notes (Deleted)
Cardiology Office Note  Date:  04/15/2023   ID:  Russell Joy., DOB 1951/06/20, MRN [id number]  PCP:  Smitty Cords, DO   No chief complaint on file.   HPI:  Russell [name] is a 72 year old gentleman with a history of  diabetes,  Hypertension, who initially presented for abnormal EKG. he had a colonoscopy. During the procedure, noted to have abnormal telemetry. EKG was performed that showed anterolateral wall abnormality.  echocardiogram showed depressed ejection fraction 25-30%.   2014 pharmacologic Myoview suggested mid to distal anterior wall perfusion defect concerning for ischemia. started on beta blocker and ACE inhibitor cardiac catheterization given the perfusion defect and severely depressed ejection fraction. -- no significant CAD, ejection fraction had improved up to 40%. Echo 9/20: EF is 30 to 35% He presents  for routine follow-up of his nonischemic cardiomyopathy and hypertension, has ICD  Last seen by myself in clinic October 2023   Tested COVID-positive October 05, 2022,  Reports having no symptoms from COVID, wife was sick in the past  Reports feeling well in general denies shortness of breath, no chest pain, no tachy arrhythmia Denies near-syncope or syncope Notes significant leg swelling, no PND orthopnea  BP 130 systolic range  Now retired, staying busy in the daytime doing chores around the house  EKG personally reviewed by myself on todays visit Normal sinus rhythm rate 62 bpm, PVCs, consider septal MI, T wave abnormality V4 through V6 1 and aVL consider inferolateral ischemia  echo up to 30 to 35% September 2020 Declining repeat study on prior clinic visit  Followed by EP in [place]  Other past medical history reviewed On 05/03/2019,  playing baseball,  developed chest pain, collapsed,  unresponsive. Bystander started CPR   in ventricular fibrillation and required 5 shocks while in the field with subsequent ROSC.  He was intubated  Total  downtime estimated at approximately 5 minutes. Cooling protocol.  Troponin peaked at 0.69.   sustained VT on 05/06/2019 with successful defibrillation in the hospital  started on Amiodarone infusion  05/07/2019,  right and left heart catheterization with mild luminal irregularities   severely reduced LVEF (20-25%) with global hypokinesis  ICD was placed  PMH:   has a past medical history of CAD (coronary artery disease), Diabetes (HCC), Frequent PVCs, Heart failure with reduced ejection fraction (HCC), Hypertension, Impotence of organic origin, NICM (nonischemic cardiomyopathy) (HCC), Pain in joint, shoulder region, Personal history of colonic polyps, and Ventricular tachycardia (HCC).  PSH:    Past Surgical History:  Procedure Laterality Date   CARDIAC CATHETERIZATION  11/17/2013   COLONOSCOPY  2009   ICD IMPLANT N/A 05/08/2019   Procedure: ICD IMPLANT;  Surgeon: Hillis Range, MD;  Location: [facility]IVE [facility];  Service: Cardiovascular;  Laterality: N/A;   RIGHT/LEFT HEART CATH AND CORONARY ANGIOGRAPHY N/A 05/07/2019   Procedure: RIGHT/LEFT HEART CATH AND CORONARY ANGIOGRAPHY;  Surgeon: Yvonne Kendall, MD;  Location: [facility] INVASIVE CV LAB;  Service: Cardiovascular;  Laterality: N/A;    Current Outpatient Medications  Medication Sig Dispense Refill   Alcohol Swabs (DROPSAFE ALCOHOL PREP) 70 % PADS Apply topically.     amiodarone (PACERONE) 200 MG tablet TAKE 1/2 TABLET EVERY DAY 45 tablet 3   aspirin 81 MG chewable tablet Chew by mouth daily.     BD SYRINGE SLIP TIP 25G X 5/8" 1 ML MISC      Blood Glucose Monitoring Suppl (ONE TOUCH ULTRA 2) w/Device KIT Use to check blood sugar x 1 daily 1 kit  0   brimonidine (ALPHAGAN) 0.2 % ophthalmic solution Place 1 drop into both eyes 2 (two) times daily.     carvedilol (COREG) 25 MG tablet Take 1 tablet (25 mg total) by mouth 2 (two) times daily. 180 tablet 3   Cholecalciferol (VITAMIN D3) 2000 units TABS Take 1 capsule by mouth daily.       dorzolamide-timolol (COSOPT) 22.3-6.8 MG/ML ophthalmic solution Place 1 drop into both eyes 2 (two) times daily.     empagliflozin (JARDIANCE) 10 MG TABS tablet Take 1 tablet (10 mg total) by mouth daily before breakfast. 30 tablet 5   finasteride (PROSCAR) 5 MG tablet Take 1 tablet (5 mg total) by mouth daily. 90 tablet 3   isosorbide mononitrate (IMDUR) 60 MG 24 hr tablet TAKE 1 AND 1/2 TABLETS TWICE DAILY (DOSE INCREASE) 270 tablet 0   Lancets (ONETOUCH DELICA PLUS LANCET33G) MISC USE TO CHECK BLOOD SUGAR UP TO 1 TIME DAILY 100 each 3   latanoprost (XALATAN) 0.005 % ophthalmic solution Place 1 drop into both eyes at bedtime.      metFORMIN (GLUCOPHAGE-XR) 500 MG 24 hr tablet Take 2 tablets (1,000 mg total) by mouth daily with breakfast. 180 tablet 3   nitroGLYCERIN (NITROSTAT) 0.4 MG SL tablet DISSOLVE ONE TABLET UNDER THE TONGUE EVERY 5 MINUTES AS NEEDED FOR CHEST PAIN.  DO NOT EXCEED A TOTAL OF 3 DOSES IN 15 MINUTES 25 tablet 0   ONETOUCH ULTRA test strip TEST BLOOD SUGAR ONE TIME DAILY 100 strip 3   polyvinyl alcohol (LIQUIFILM TEARS) 1.4 % ophthalmic solution Place 2 drops into both eyes every 8 (eight) hours. 15 mL 0   pravastatin (PRAVACHOL) 10 MG tablet TAKE 1 TABLET AT BEDTIME 90 tablet 3   sacubitril-valsartan (ENTRESTO) 97-103 MG Take 1 tablet by mouth 2 (two) times daily. 28 tablet 1   spironolactone (ALDACTONE) 25 MG tablet Take 1 tablet (25 mg total) by mouth daily. 90 tablet 0   tamsulosin (FLOMAX) 0.4 MG CAPS capsule Take 1 capsule (0.4 mg total) by mouth daily. 90 capsule 3   No current facility-administered medications for this visit.    Allergies:   Patient has no known allergies.   Social History:  The patient  reports that he has never smoked. He has never used smokeless tobacco. He reports current alcohol use. He reports that he does not use drugs.   Family History:   family history includes Colon cancer in his father; Diabetes in his mother; Hypertension in his mother.     Review of Systems: Review of Systems  Constitutional: Negative.   HENT: Negative.    Respiratory: Negative.    Cardiovascular: Negative.   Gastrointestinal: Negative.   Musculoskeletal: Negative.   Neurological: Negative.   Psychiatric/Behavioral: Negative.    All other systems reviewed and are negative.   PHYSICAL EXAM: VS:  There were no vitals taken for this visit. , BMI There is no height or weight on file to calculate BMI. Constitutional:  oriented to person, place, and time. No distress.  HENT:  Head: Grossly normal Eyes:  no discharge. No scleral icterus.  Neck: No JVD, no carotid bruits  Cardiovascular: Regular rate and rhythm, no murmurs appreciated Pulmonary/Chest: Clear to auscultation bilaterally, no wheezes or rails Abdominal: Soft.  no distension.  no tenderness.  Musculoskeletal: Normal range of motion Neurological:  normal muscle tone. Coordination normal. No atrophy Skin: Skin warm and dry Psychiatric: normal affect, pleasant  Recent Labs: 10/11/2022: ALT 9; BUN 14; Creat 1.11; Hemoglobin  12.8; Platelets 160; Potassium 4.0; Sodium 142; TSH 1.00    Lipid Panel Lab Results  Component Value Date   CHOL 159 10/11/2022   HDL 73 10/11/2022   LDLCALC 70 10/11/2022   TRIG 79 10/11/2022    Wt Readings from Last 3 Encounters:  01/24/23 158 lb (71.7 kg)  10/18/22 162 lb (73.5 kg)  10/10/22 166 lb 8 oz (75.5 kg)     ASSESSMENT AND PLAN:  Congestive dilated cardiomyopathy (HCC)  We have recommended that he continue carvedilol 25 twice daily, Cardura 1 twice daily, isosorbide 90 twice daily, Entresto 97/103 twice daily spironolactone 25 daily Given on Diabetes A1c greater than 7, recommended he add Jardiance 10 mg daily  Nonischemic cardiomyopathy Nonobstructive coronary disease by prior catheterization 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, blood pressure well controlled Ejection fraction 30 to 35% Add Jardiance as above  Controlled type 2  diabetes mellitus without complication, unspecified long term insulin use status (HCC) --  A1c 7.7 We have written prescription for Jardiance 10 mg daily  VF Defibrillator in place, no recent events Download reviewed with him, higher OptiVol Followed by EP   Total encounter time more than 30 minutes  Greater than 50% was spent in counseling and coordination of care with the patient    No orders of the defined types were placed in this encounter.     Signed, Dossie Arbour, M.D., Ph.D. 04/15/2023  [facility] [place], [place] [phone]

## 2023-04-16 ENCOUNTER — Ambulatory Visit: Payer: Medicare HMO | Admitting: Cardiovascular Disease

## 2023-04-16 DIAGNOSIS — E1136 Type 2 diabetes mellitus with diabetic cataract: Secondary | ICD-10-CM

## 2023-04-16 DIAGNOSIS — Z95810 Presence of automatic (implantable) cardiac defibrillator: Secondary | ICD-10-CM

## 2023-04-16 DIAGNOSIS — I7090 Unspecified atherosclerosis: Secondary | ICD-10-CM

## 2023-04-16 DIAGNOSIS — I5022 Chronic systolic (congestive) heart failure: Secondary | ICD-10-CM

## 2023-04-16 DIAGNOSIS — I110 Hypertensive heart disease with heart failure: Secondary | ICD-10-CM

## 2023-04-16 DIAGNOSIS — I4901 Ventricular fibrillation: Secondary | ICD-10-CM

## 2023-04-16 DIAGNOSIS — I428 Other cardiomyopathies: Secondary | ICD-10-CM

## 2023-04-16 DIAGNOSIS — I472 Ventricular tachycardia, unspecified: Secondary | ICD-10-CM

## 2023-04-16 DIAGNOSIS — I10 Essential (primary) hypertension: Secondary | ICD-10-CM

## 2023-04-19 ENCOUNTER — Other Ambulatory Visit: Payer: Self-pay | Admitting: Family Medicine

## 2023-04-19 ENCOUNTER — Encounter: Payer: Self-pay | Admitting: Family Medicine

## 2023-04-19 ENCOUNTER — Ambulatory Visit (INDEPENDENT_AMBULATORY_CARE_PROVIDER_SITE_OTHER): Payer: Medicare HMO | Admitting: Family Medicine

## 2023-04-19 VITALS — BP 118/68 | HR 56 | Ht 68.0 in | Wt 159.0 lb

## 2023-04-19 DIAGNOSIS — Z0000 Encounter for general adult medical examination without abnormal findings: Secondary | ICD-10-CM

## 2023-04-19 DIAGNOSIS — I10 Essential (primary) hypertension: Secondary | ICD-10-CM

## 2023-04-19 DIAGNOSIS — E1136 Type 2 diabetes mellitus with diabetic cataract: Secondary | ICD-10-CM

## 2023-04-19 DIAGNOSIS — I5022 Chronic systolic (congestive) heart failure: Secondary | ICD-10-CM

## 2023-04-19 DIAGNOSIS — N401 Enlarged prostate with lower urinary tract symptoms: Secondary | ICD-10-CM

## 2023-04-19 DIAGNOSIS — E1169 Type 2 diabetes mellitus with other specified complication: Secondary | ICD-10-CM

## 2023-04-19 DIAGNOSIS — R3911 Hesitancy of micturition: Secondary | ICD-10-CM | POA: Diagnosis not present

## 2023-04-19 DIAGNOSIS — Z1211 Encounter for screening for malignant neoplasm of colon: Secondary | ICD-10-CM | POA: Diagnosis not present

## 2023-04-19 DIAGNOSIS — E785 Hyperlipidemia, unspecified: Secondary | ICD-10-CM

## 2023-04-19 DIAGNOSIS — R7989 Other specified abnormal findings of blood chemistry: Secondary | ICD-10-CM

## 2023-04-19 LAB — POCT GLYCOSYLATED HEMOGLOBIN (HGB A1C): Hemoglobin A1C: 7.2 % — AB (ref 4.0–5.6)

## 2023-04-19 MED ORDER — METFORMIN HCL ER 500 MG PO TB24: 1000.0000 mg | ORAL_TABLET | Freq: Every day | ORAL | 3 refills | Status: DC

## 2023-04-19 NOTE — Patient Instructions (Addendum)
Thank you for coming to the office today.  Recent Labs    06/14/22 1055 10/11/22 0802 04/19/23 0932  HGBA1C 8.5* 8.1* 7.2*   Keep up the great work  Okay to be [name] up with Dr Richardo Hanks. Continue medications.  We will check kidney function next time.  Eye exam due 07/2023, ask them to fax [place] a copy [phone]   Ordered the Cologuard (home kit) test for colon cancer screening. Stay tuned for further updates.  SEPTEMBER 2024 It will be shipped to you directly. If not received in 2-4 weeks, call us or the company.   If you send it back and no results are received in 2-4 weeks, call us or the company as well!   Colon Cancer Screening: - For all adults age 53+ routine colon cancer screening is highly recommended.     - Recent guidelines from American Cancer Society recommend starting age of 70 - Early detection of colon cancer is important, because often there are no warning signs or symptoms, also if found early usually it can be cured. Late stage is hard to treat.   - If Cologuard is NEGATIVE, then it is good for 3 years before next due - If Cologuard is POSITIVE, then it is strongly advised to get a Colonoscopy, which allows the GI doctor to locate the source of the cancer or polyp (even very early stage) and treat it by removing it. ------------------------- Follow instructions to collect sample, you may call the company for any help or questions, 24/7 telephone support at [phone].    DUE for FASTING BLOOD WORK (no food or drink after midnight before the lab appointment, only water or coffee without cream/sugar on the morning of)  SCHEDULE "Lab Only" visit in the morning at the clinic for lab draw in 6 MONTHS   - Make sure Lab Only appointment is at about 1 week before your next appointment, so that results will be available  For Lab Results, once available within 2-3 days of blood draw, you can can log in to MyChart online to view your results and a  brief explanation. Also, we can discuss results at next follow-up visit.   Please schedule a Follow-up Appointment to: Return in about 6 months (around 10/19/2023) for 70 month fasting lab only then 1 week later Annual Physical.  If you have any other questions or concerns, please feel free to call the office or send a message through MyChart. You may also schedule an earlier appointment if necessary.  Additionally, you may be receiving a survey about your experience at our office within a few days to 1 week by e-mail or mail. We value your feedback.  Russell Pilar, DO [facility], [place]

## 2023-04-19 NOTE — Assessment & Plan Note (Signed)
A1c improved to 7.2 No evidence of hypoglycemia Complications - DM cataracts, glaucoma, other including atherosclerotic vascular disease - increases risk of future cardiovascular complications  Off Jardiance - side effect  Plan: 1. Continue Metformin XR 500 TWICE A DAY 2. Encourage improved lifestyle - low carb, low sugar diet, reduce portion size, continue improving regular exercise - reviewed diet recommendation 3. Check CBG, bring log to next visit for review 4. Continue ACEi, ASA, Statin

## 2023-04-19 NOTE — Assessment & Plan Note (Signed)
Followed by Cardiology Euvolemic, no flare up On med management 

## 2023-04-19 NOTE — Assessment & Plan Note (Signed)
Followed by [facility] [place] On Finasteride and Flomax Prior Prostate MRI. Mild elevated PSA followed now with lowering consistent w/ finasteride

## 2023-04-19 NOTE — Progress Notes (Signed)
Subjective:    Patient ID: Russell Joy., male    DOB: [birth year], 72 y.o.   MRN: [id number]  Russell Bunton. is a 72 y.o. male presenting on 04/19/2023 for Medical Management of Chronic Issues   HPI  CHRONIC DM, Type 2 with cataracts / Atherosclerotic vascular disease Due A1c today  CBGs: Checks CBG every other day, no new readings lately Cardiology tried to order Jardiance, but higher cost, he asks for assistance Meds: Metformin  x2 XR daily - He has STOPPED Jardiance  daily, BP was too low and urinary symptoms. Reports good compliance. Tolerating well w/o side-effects Currently on ACEi He is tolerating it well. On Pravastatin  nightly, no side effects or myalgia Improved LDL on statin Has OneTouch Ultra 2 since last year. Lifestyle:  - Diet (Improving diet, less sweets, less sugar, no sodas) - Exercise (walking increase, up to 2 miles every other day) - He is now followed by [facility] Dr Brooke Dare see below, for glaucoma and cataracts Denies hypoglycemia, polyuria, visual changes, numbness or tingling.   History of COVID19 - resolved Recent infection, wife had it as well. He had limited symptoms.   CHRONIC HTN / Systolic CHF / Cardiomyopathy Followed by Cardiology Current Meds - Carvedilol  BID, Entrestro 97-103 BID, Isosorbide BID Reports good compliance, took meds today. Tolerating well, w/o complaints. Lifestyle: Denies CP, dyspnea, HA, edema, dizziness / lightheadedness   BPH LUTS Previously followed by Dr Evelene Croon Now transferred to Dr Richardo Hanks [facility] [place] He continues on Flomax and Finasteride, he has had good results with improved urinary symptoms Prostate MRI has been normal. And PSA was previously mild elevated. Has reduced to PSA 3.39, on finasteride.    Health Maintenance:   Future COVID and Shingrix vaccines   Colon CA Screening: Last Colonoscopy 09/24/2013 (done by Dr Lemar Livings), results with no polyps, good for 10 years. Currently  asymptomatic. known family history of colon CA  Cologuard done 09/01/20 - negative. Good for 3 years. Next due 2024      04/19/2023   10:01 AM 10/18/2022    9:57 AM 04/21/2022    9:46 AM  Depression screen PHQ 2/9  Decreased Interest 0 0 0  Down, Depressed, Hopeless 0 0 0  PHQ - 2 Score 0 0 0  Altered sleeping  0   Tired, decreased energy  0   Change in appetite  0   Feeling bad or failure about yourself   0   Trouble concentrating  0   Moving slowly or fidgety/restless  0   Suicidal thoughts  0   PHQ-9 Score  0   Difficult doing work/chores  Not difficult at all     Social History   Tobacco Use   Smoking status: Never   Smokeless tobacco: Never  Vaping Use   Vaping Use: Never used  Substance Use Topics   Alcohol use: Yes    Comment: occassional   Drug use: No    Review of Systems Per HPI unless specifically indicated above     Objective:    BP 118/68   Pulse (!) 56   Ht  (1.727 m)   Wt 159 lb (72.1 kg)   SpO2 96%   BMI 24.18 kg/m   Wt Readings from Last 3 Encounters:  04/19/23 159 lb (72.1 kg)  01/24/23 158 lb (71.7 kg)  10/18/22 162 lb (73.5 kg)    Physical Exam Vitals and nursing note reviewed.  Constitutional:  General: He is not in acute distress.    Appearance: He is well-developed. He is not diaphoretic.     Comments: Well-appearing, comfortable, cooperative  HENT:     Head: Normocephalic and atraumatic.  Eyes:     General:        Right eye: No discharge.        Left eye: No discharge.     Conjunctiva/sclera: Conjunctivae normal.  Neck:     Thyroid: No thyromegaly.  Cardiovascular:     Rate and Rhythm: Normal rate and regular rhythm.     Pulses: Normal pulses.     Heart sounds: Normal heart sounds. No murmur heard. Pulmonary:     Effort: Pulmonary effort is normal. No respiratory distress.     Breath sounds: Normal breath sounds. No wheezing or rales.  Musculoskeletal:        General: Normal range of motion.     Cervical back:  Normal range of motion and neck supple.  Lymphadenopathy:     Cervical: No cervical adenopathy.  Skin:    General: Skin is warm and dry.     Findings: No erythema or rash.  Neurological:     Mental Status: He is alert and oriented to person, place, and time. Mental status is at baseline.  Psychiatric:        Behavior: Behavior normal.     Comments: Well groomed, good eye contact, normal speech and thoughts      Results for orders placed or performed in visit on 04/19/23  POCT glycosylated hemoglobin (Hb A1C)  Result Value Ref Range   Hemoglobin A1C 7.2 (A) 4.0 - 5.6 %   HbA1c POC (<> result, manual entry)     HbA1c, POC (prediabetic range)     HbA1c, POC (controlled diabetic range)        Assessment & Plan:   Problem List Items Addressed This Visit     Benign prostatic hyperplasia with urinary hesitancy    Followed by [facility] [place] On Finasteride and Flomax Prior Prostate MRI. 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 Continue Metformin XR 500 TWICE A DAY 2. Encourage improved lifestyle - low carb, low sugar diet, reduce portion size, continue improving regular exercise - reviewed diet recommendation 3. Check CBG, bring log to next visit for review 4. Continue ACEi, ASA, Statin      Relevant Medications   metFORMIN (GLUCOPHAGE-XR) 500 MG 24 hr tablet   Other Relevant Orders   POCT glycosylated hemoglobin (Hb A1C) (Completed)   Essential hypertension    Well-controlled HTN - Home BP readings  reviewed Complication with CHF    Plan:  1. Continue current BP regimen Carvedilol 25 TWICE A DAY, Spironolactone 25 mg 2. Encourage improved lifestyle - low sodium diet, regular exercise 3.Continue monitor BP outside office, bring readings to next visit, if persistently >140/90 or new symptoms notify office sooner      Hyperlipidemia associated with type 2 diabetes mellitus    Controlled On Pravastatin      Relevant Medications   metFORMIN (GLUCOPHAGE-XR)  500 MG 24 hr tablet   Other Visit Diagnoses     Screening for colon cancer       Relevant Orders   Cologuard        Ordered the Cologuard (home kit) test for colon cancer screening. Stay tuned for further updates. SEPTEMBER 2024  Orders Placed This Encounter  Procedures   Cologuard   POCT glycosylated hemoglobin (Hb A1C)     Meds ordered this encounter  Medications   metFORMIN (GLUCOPHAGE-XR) 500 MG 24 hr tablet    Sig: Take 2 tablets (1,000 mg total) by mouth daily with breakfast.    Dispense:  180 tablet    Refill:  3    Future refills      Follow up plan: Return in about 6 months (around 10/19/2023) for 6 month fasting lab only then 1 week later Annual Physical.  Future labs ordered for 09/2023   Saralyn Pilar, DO [facility] 04/19/2023, 9:32 AM

## 2023-04-19 NOTE — Assessment & Plan Note (Signed)
Well-controlled HTN - Home BP readings  reviewed Complication with CHF   Plan:  1. Continue current BP regimen Carvedilol 25 TWICE A DAY, Spironolactone 25 mg 2. Encourage improved lifestyle - low sodium diet, regular exercise 3.Continue monitor BP outside office, bring readings to next visit, if persistently >140/90 or new symptoms notify office sooner

## 2023-04-19 NOTE — Assessment & Plan Note (Signed)
Controlled On Pravastatin 

## 2023-04-23 ENCOUNTER — Ambulatory Visit: Payer: Medicare HMO | Attending: Cardiology

## 2023-04-23 DIAGNOSIS — I5022 Chronic systolic (congestive) heart failure: Secondary | ICD-10-CM

## 2023-04-23 DIAGNOSIS — Z95810 Presence of automatic (implantable) cardiac defibrillator: Secondary | ICD-10-CM

## 2023-04-24 ENCOUNTER — Other Ambulatory Visit: Payer: Self-pay | Admitting: *Deleted

## 2023-04-24 MED ORDER — AMIODARONE HCL 200 MG PO TABS: 100.0000 mg | ORAL_TABLET | Freq: Every day | ORAL | 3 refills | Status: DC

## 2023-04-25 DIAGNOSIS — Z8679 Personal history of other diseases of the circulatory system: Secondary | ICD-10-CM | POA: Insufficient documentation

## 2023-04-25 DIAGNOSIS — I472 Ventricular tachycardia, unspecified: Secondary | ICD-10-CM | POA: Insufficient documentation

## 2023-04-25 NOTE — Progress Notes (Unsigned)
Cardiology Office Note Date:  04/26/2023  Patient ID:  Russell Longman., DOB [birth year], MRN [id number] PCP:  Smitty Cords, DO  Cardiologist:  Julien Nordmann, MD Electrophysiologist: Dr. Johney Frame > Lanier Prude, MD    Chief Complaint: 6 month follow-up  History of Present Illness: Russell Frampton. is a 72 y.o. male with PMH notable for HFrEF, VT s/p ICD, PVC, T2DM, non-obs CAD, HTN, cardiac arrest; seen today for Lanier Prude, MD for routine electrophysiology followup.   Last saw Dr. Lalla Brothers 09/2022. On amiodarone 100mg  daily, doing well.  He is followed by ICM monthly.  Today, he states that he is doing very well, walks a couple miles most days of the week. No SOB, CP, palpitations when walking.  Denies palpitations  Takes BP often at home, most readings are 110-120s/70s.  He does not weigh himself regularly, but good weight is around 159lbs. He is unaware of fluid status, does not hold fluid in abd, no lower extremity edema. Good appetite, sleeps on 2 pillows d/t back discomfort.   Device Information: MDT single chamber ICD, imp 04/2019, dx HFrEF, VT  AAD History: Amiodarone   Past Medical History:  Diagnosis Date   CAD (coronary artery disease)    05/07/2019 LHC showed nonobstructive CAD   Diabetes (HCC)    Frequent PVCs    Heart failure with reduced ejection fraction (HCC)    04/2019 EF 20-25%   Hypertension    Impotence of organic origin    NICM (nonischemic cardiomyopathy) (HCC)    HFrEF in setting of NICM   Pain in joint, shoulder region    Personal history of colonic polyps    Ventricular tachycardia (HCC)    04/2019 in setting of cardiac arrest    Past Surgical History:  Procedure Laterality Date   CARDIAC CATHETERIZATION  11/17/2013   COLONOSCOPY  2009   ICD IMPLANT N/A 05/08/2019   Procedure: ICD IMPLANT;  Surgeon: Hillis Range, MD;  [facility];  Service: Cardiovascular;  Laterality: N/A;   RIGHT/LEFT HEART CATH AND  CORONARY ANGIOGRAPHY N/A 05/07/2019   Procedure: RIGHT/LEFT HEART CATH AND CORONARY ANGIOGRAPHY;  Surgeon: Yvonne Kendall, MD;  Location: [facility] INVASIVE CV LAB;  Service: Cardiovascular;  Laterality: N/A;    Current Outpatient Medications  Medication Instructions   Alcohol Swabs (DROPSAFE ALCOHOL PREP) 70 % PADS Topical   amiodarone (PACERONE) 100 mg, Oral, Daily   aspirin 81 MG chewable tablet Oral, Daily   BD SYRINGE SLIP TIP 25G X 5/8" 1 ML MISC    Blood Glucose Monitoring Suppl (ONE TOUCH ULTRA 2) w/Device KIT Use to check blood sugar x 1 daily   brimonidine (ALPHAGAN) 0.2 % ophthalmic solution 1 drop, Both Eyes, 2 times daily   carvedilol (COREG) 25 mg, Oral, 2 times daily   Cholecalciferol (VITAMIN D3) 2000 units TABS 1 capsule, Oral, Daily   dorzolamide-timolol (COSOPT) 22.3-6.8 MG/ML ophthalmic solution 1 drop, Both Eyes, 2 times daily   finasteride (PROSCAR) 5 mg, Oral, Daily   isosorbide mononitrate (IMDUR) 60 MG 24 hr tablet TAKE 1 AND 1/2 TABLETS TWICE DAILY (DOSE INCREASE)   Lancets (ONETOUCH DELICA PLUS LANCET33G) MISC USE TO CHECK BLOOD SUGAR UP TO 1 TIME DAILY   latanoprost (XALATAN) 0.005 % ophthalmic solution 1 drop, Both Eyes, Daily at bedtime   metFORMIN (GLUCOPHAGE-XR) 1,000 mg, Oral, Daily with breakfast   nitroGLYCERIN (NITROSTAT) 0.4 MG SL tablet DISSOLVE ONE TABLET UNDER THE TONGUE EVERY 5 MINUTES AS NEEDED FOR CHEST PAIN.  DO NOT EXCEED A TOTAL OF 3 DOSES IN 15 MINUTES   ONETOUCH ULTRA test strip TEST BLOOD SUGAR ONE TIME DAILY   polyvinyl alcohol (LIQUIFILM TEARS) 1.4 % ophthalmic solution 2 drops, Both Eyes, Every 8 hours   pravastatin (PRAVACHOL) 10 MG tablet TAKE 1 TABLET AT BEDTIME   sacubitril-valsartan (ENTRESTO) 97-103 MG 1 tablet, Oral, 2 times daily   spironolactone (ALDACTONE) 25 mg, Oral, Daily   tamsulosin (FLOMAX) 0.4 mg, Oral, Daily    Social History:  The patient  reports that he has never smoked. He has never used smokeless tobacco. He reports  current alcohol use. He reports that he does not use drugs.   Family History: The patient's family history includes Colon cancer in his father; Diabetes in his mother; Hypertension in his mother.  ROS:  Please see the history of present illness. All other systems are reviewed and otherwise negative.   PHYSICAL EXAM:  VS:  BP 118/78 (BP Location: Left Arm, Patient Position: Sitting, Cuff Size: Normal)   Pulse (!) 59   Ht 5\' 8"  (1.727 m)   Wt 159 lb (72.1 kg)   SpO2 98%   BMI 24.18 kg/m  BMI: Body mass index is 24.18 kg/m.  GEN- The patient is well appearing, alert and oriented x 3 today.   Lungs- Clear to ausculation bilaterally, normal work of breathing.  Heart- Regular rate and rhythm, no murmurs, rubs or gallops Extremities- No peripheral edema, warm, dry Skin-   device pocket well-healed, no tethering   Device interrogation done today and reviewed by myself:  Battery good Lead threshold, impedence, sensing stable  No episodes OptiVol elevated Adjusted VF pathway to AX > B per safety recommendation   EKG is ordered. Personal review of EKG from today shows: SB at 59bpm  Recent Labs: 10/11/2022: ALT 9; BUN 14; Creat 1.11; Hemoglobin 12.8; Platelets 160; Potassium 4.0; Sodium 142; TSH 1.00  10/11/2022: Cholesterol 159; HDL 73; LDL Cholesterol (Calc) 70; Total CHOL/HDL Ratio 2.2; Triglycerides 79   CrCl cannot be calculated (Patient's most recent lab result is older than the maximum 21 days allowed.).   Wt Readings from Last 3 Encounters:  04/26/23 159 lb (72.1 kg)  04/19/23 159 lb (72.1 kg)  01/24/23 158 lb (71.7 kg)     Additional studies reviewed include: Previous EP, cardiology notes.   TTE, 09/12/2019 1. Left ventricular ejection fraction, by visual estimation, is 30 to 35%. The left ventricle has moderate to severely decreased function. Normal left ventricular size. There is no left ventricular hypertrophy. Global hypokinesis, unable to exclude regional wall motion  abnormality.   2. Left ventricular diastolic Doppler parameters are consistent with impaired relaxation pattern of LV diastolic filling.   3. Global right ventricle has normal systolic function.The right ventricular size is normal. No increase in right ventricular wall thickness.   4. Normal pulmonary artery systolic pressure.   5. Left atrial size was mildly dilated.   6. Mild mitral valve regurgitation.   7. Tricuspid valve regurgitation is mild.   8. A pacer wire is visualized.    ASSESSMENT AND PLAN:  #) VT s/p MDT ICD Device functioning well, see paceart for details No VT by device OptiVol elevated, though does not present fluid overloaded - BNP to eval Continue amiodarone - update amio labs  #) HFrEF NYHA class II symptoms Warm and dry on exam with good activity tolerance GDMT: coreg, entresto, spiro -- did not toelrate jardiance Diuretic: none   Current medicines are reviewed at  length with the patient today.   The patient does not have concerns regarding his medicines.  The following changes were made today:  none  Labs/ tests ordered today include:  Orders Placed This Encounter  Procedures   Comp Met (CMET)   TSH   T4, free   B Nat Peptide   EKG 12-Lead     Disposition: Follow up with Dr. Lalla Brothers or EP APP in 6 months   Signed, Sherie Don, NP  04/26/23  12:31 PM  Electrophysiology [facility]

## 2023-04-26 ENCOUNTER — Other Ambulatory Visit
Admission: RE | Admit: 2023-04-26 | Discharge: 2023-04-26 | Disposition: A | Discharge status: Home / Self Care | Payer: Medicare HMO | Source: Ambulatory Visit | Attending: Cardiology | Admitting: Cardiology

## 2023-04-26 ENCOUNTER — Ambulatory Visit: Payer: Medicare HMO | Attending: Cardiology | Admitting: Cardiology

## 2023-04-26 ENCOUNTER — Encounter: Payer: Self-pay | Admitting: Cardiology

## 2023-04-26 VITALS — BP 118/78 | HR 59 | Ht 68.0 in | Wt 159.0 lb

## 2023-04-26 DIAGNOSIS — I5022 Chronic systolic (congestive) heart failure: Secondary | ICD-10-CM

## 2023-04-26 DIAGNOSIS — Z79899 Other long term (current) drug therapy: Secondary | ICD-10-CM | POA: Diagnosis not present

## 2023-04-26 DIAGNOSIS — Z95810 Presence of automatic (implantable) cardiac defibrillator: Secondary | ICD-10-CM | POA: Diagnosis not present

## 2023-04-26 DIAGNOSIS — I472 Ventricular tachycardia, unspecified: Secondary | ICD-10-CM

## 2023-04-26 DIAGNOSIS — I10 Essential (primary) hypertension: Secondary | ICD-10-CM

## 2023-04-26 LAB — COMPREHENSIVE METABOLIC PANEL
ALT: 14 U/L (ref 0–44)
AST: 12 U/L — ABNORMAL LOW (ref 15–41)
Albumin: 3.6 g/dL (ref 3.5–5.0)
Alkaline Phosphatase: 37 U/L — ABNORMAL LOW (ref 38–126)
Anion gap: 6 (ref 5–15)
BUN: 17 mg/dL (ref 8–23)
CO2: 26 mmol/L (ref 22–32)
Calcium: 9 mg/dL (ref 8.9–10.3)
Chloride: 108 mmol/L (ref 98–111)
Creatinine, Ser: 1.11 mg/dL (ref 0.61–1.24)
GFR, Estimated: >60 mL/min (ref >60)
Glucose, Bld: 175 mg/dL — ABNORMAL HIGH (ref 70–99)
Potassium: 4.9 mmol/L (ref 3.5–5.1)
Sodium: 140 mmol/L (ref 135–145)
Total Bilirubin: 0.6 mg/dL (ref 0.3–1.2)
Total Protein: 6.5 g/dL (ref 6.5–8.1)

## 2023-04-26 LAB — CUP PACEART INCLINIC DEVICE CHECK
Date Time Interrogation Session: 20240502123916
Implantable Lead Connection Status: 753985
Implantable Lead Implant Date: 20200514
Implantable Lead Location: 753860
Implantable Lead Model: 6935
Implantable Pulse Generator Implant Date: 20200514

## 2023-04-26 LAB — TSH: TSH: 0.744 u[IU]/mL (ref 0.350–4.500)

## 2023-04-26 LAB — BRAIN NATRIURETIC PEPTIDE: B Natriuretic Peptide: 56.5 pg/mL (ref 0.0–100.0)

## 2023-04-26 LAB — T4, FREE: Free T4: 1.22 ng/dL — ABNORMAL HIGH (ref 0.61–1.12)

## 2023-04-26 MED ORDER — AMIODARONE HCL 200 MG PO TABS: 100.0000 mg | ORAL_TABLET | Freq: Every day | ORAL | 1 refills | Status: DC

## 2023-04-26 NOTE — Patient Instructions (Signed)
Medication Instructions:  Your physician recommends that you continue on your current medications as directed. Please refer to the Current Medication list given to you today.  *If you need a refill on your cardiac medications before your next appointment, please call your pharmacy*   Lab Work: CMP, TSH/free T4, and BNP - Please go to the [facility]. You will check in at the front desk to the right as you walk into the atrium. Valet Parking is offered if needed. - No appointment needed. You may go any day between 7 am and 6 pm.  If you have labs (blood work) drawn today and your tests are completely normal, you will receive your results only by: MyChart Message (if you have MyChart) OR A paper copy in the mail If you have any lab test that is abnormal or we need to change your treatment, we will call you to review the results.   Testing/Procedures: No testing ordered   Follow-Up: At [facility], you and your health needs are our priority.  As part of our continuing mission to provide you with exceptional heart care, we have created designated Provider Care Teams.  These Care Teams include your primary Cardiologist (physician) and Advanced Practice Providers (APPs -  Physician Assistants and Nurse Practitioners) who all work together to provide you with the care you need, when you need it.  We recommend signing up for the patient portal called "MyChart".  Sign up information is provided on this After Visit Summary.  MyChart is used to connect with patients for Virtual Visits (Telemedicine).  Patients are able to view lab/test results, encounter notes, upcoming appointments, etc.  Non-urgent messages can be sent to your provider as well.   To learn more about what you can do with MyChart, go to [email or web address].    Your next appointment:   6 month(s)  Provider:   Steffanie Dunn, MD or Sherie Don, NP

## 2023-04-27 NOTE — Progress Notes (Signed)
EPIC Encounter for ICM Monitoring  Patient Name: Russell [name]. is a 72 y.o. male Date: 04/27/2023 Primary Care Physican: Smitty Cords, DO Primary Cardiologist: Mariah Milling Electrophysiologist: Lalla Brothers 11/24/2022 Weight: 165 lbs 04/27/2023 Weight: 159 lbs                                                          Spoke with patient and heart failure questions reviewed.  Transmission results reviewed.  Pt asymptomatic for fluid accumulation.  Reports feeling well at this time and voices no complaints.   Unsure what may be causing possible fluid accumulation. He typically eats a healthy diet.  Per 5/2 OV note, patient was not overloaded by exam.     Diet:  Follows a low salt diet by eating a lot of fruits and vegetables and limiting fluid intake to 64 oz daily.   He rarely eats restaurants foods.     Optivol thoracic impedance suggesting possible fluid accumulation from 3/24-4/28 and returned to normal 4/29.    Prescribed: Spironolactone 25 mg take 1 tablet daily   Labs: 04/26/2023 Creatinine 1.11, BUN 17, Potassium 4.9, Sodium 140, GFR >60 A complete set of results can be found in Results Review.   Recommendations: No changes and encouraged to call if experiencing any fluid symptoms.    Follow-up plan: ICM clinic phone appointment on 05/28/2023.   91 day device clinic remote transmission 05/07/2023.    EP/Cardiology Next Office Visit:  05/09/2023 with Dr Mariah Milling.  Recall 10/23/2023 with Sherie Don, NP.     Copy of ICM check sent to Dr. Lalla Brothers.   3 month ICM trend: 04/23/2023.    12-14 Month ICM trend:     [name], RN 04/27/2023 8:47 AM

## 2023-05-07 ENCOUNTER — Telehealth: Payer: Medicare HMO

## 2023-05-07 ENCOUNTER — Ambulatory Visit (INDEPENDENT_AMBULATORY_CARE_PROVIDER_SITE_OTHER): Payer: Medicare HMO

## 2023-05-07 DIAGNOSIS — I428 Other cardiomyopathies: Secondary | ICD-10-CM | POA: Diagnosis not present
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
## 2023-05-08 ENCOUNTER — Ambulatory Visit: Admit: 2023-05-08 | Payer: MEDICARE | Attending: Medical | Primary: Internal Medicine

## 2023-05-08 LAB — CUP PACEART REMOTE DEVICE CHECK
Battery Remaining Longevity: 99 mo
Battery Voltage: 3 V
Brady Statistic RV Percent Paced: 0.05 %
Date Time Interrogation Session: 20240513032655
HighPow Impedance: 51 Ohm
Implantable Lead Connection Status: 753985
Implantable Lead Implant Date: 20200514
Implantable Lead Location: 753860
Implantable Lead Model: 6935
Implantable Pulse Generator Implant Date: 20200514
Lead Channel Impedance Value: 380 Ohm
Lead Channel Impedance Value: 399 Ohm
Lead Channel Pacing Threshold Amplitude: 0.5 V
Lead Channel Pacing Threshold Pulse Width: 0.4 ms
Lead Channel Sensing Intrinsic Amplitude: 7.375 mV
Lead Channel Sensing Intrinsic Amplitude: 7.375 mV
Lead Channel Setting Pacing Amplitude: 2 V
Lead Channel Setting Pacing Pulse Width: 0.4 ms
Lead Channel Setting Sensing Sensitivity: 0.3 mV
Zone Setting Status: 755011
Zone Setting Status: 755011

## 2023-05-09 ENCOUNTER — Encounter: Payer: Self-pay | Admitting: Cardiovascular Disease

## 2023-05-09 ENCOUNTER — Ambulatory Visit: Payer: Medicare HMO | Attending: Cardiovascular Disease | Admitting: Cardiovascular Disease

## 2023-05-09 VITALS — BP 120/64 | HR 58 | Ht 68.0 in | Wt 159.1 lb

## 2023-05-09 DIAGNOSIS — I5022 Chronic systolic (congestive) heart failure: Secondary | ICD-10-CM | POA: Diagnosis not present

## 2023-05-09 DIAGNOSIS — I428 Other cardiomyopathies: Secondary | ICD-10-CM | POA: Diagnosis not present

## 2023-05-09 DIAGNOSIS — I472 Ventricular tachycardia, unspecified: Secondary | ICD-10-CM

## 2023-05-09 DIAGNOSIS — Z7984 Long term (current) use of oral hypoglycemic drugs: Secondary | ICD-10-CM

## 2023-05-09 DIAGNOSIS — I10 Essential (primary) hypertension: Secondary | ICD-10-CM | POA: Diagnosis not present

## 2023-05-09 DIAGNOSIS — E1136 Type 2 diabetes mellitus with diabetic cataract: Secondary | ICD-10-CM | POA: Diagnosis not present

## 2023-05-09 MED ORDER — CARVEDILOL 25 MG PO TABS: 25.0000 mg | ORAL_TABLET | Freq: Two times a day (BID) | ORAL | 3 refills | Status: DC

## 2023-05-09 MED ORDER — NITROGLYCERIN 0.4 MG SL SUBL: SUBLINGUAL_TABLET | SUBLINGUAL | 1 refills | Status: DC

## 2023-05-09 MED ORDER — FUROSEMIDE 20 MG PO TABS: 20.0000 mg | ORAL_TABLET | Freq: Every day | ORAL | 3 refills | Status: DC | PRN

## 2023-05-09 MED ORDER — ISOSORBIDE MONONITRATE ER 60 MG PO TB24: ORAL_TABLET | ORAL | 0 refills | Status: DC

## 2023-05-09 NOTE — Patient Instructions (Addendum)
Medication Instructions:  Lasix 20 mg daily as needed  If you need a refill on your cardiac medications before your next appointment, please call your pharmacy.   Lab work: No new labs needed  Testing/Procedures: No new testing needed  Follow-Up: At [facility], you and your health needs are our priority.  As part of our continuing mission to provide you with exceptional heart care, we have created designated Provider Care Teams.  These Care Teams include your primary Cardiologist (physician) and Advanced Practice Providers (APPs -  Physician Assistants and Nurse Practitioners) who all work together to provide you with the care you need, when you need it.  You will need a follow up appointment in 12 months  Providers on your designated Care Team:   Nicolasa Ducking, NP Eula Listen, PA-C Cadence Fransico [place], [place]  COVID-19 Vaccine Information can be found at: [email or web address].[email or web address] For questions related to vaccine distribution or appointments, please email [email or web address] or call [phone].

## 2023-05-25 ENCOUNTER — Telehealth: Payer: Self-pay | Admitting: Cardiovascular Disease

## 2023-05-25 ENCOUNTER — Ambulatory Visit: Payer: Medicare HMO | Admitting: Pharmacist

## 2023-05-25 DIAGNOSIS — I10 Essential (primary) hypertension: Secondary | ICD-10-CM

## 2023-05-25 DIAGNOSIS — E1136 Type 2 diabetes mellitus with diabetic cataract: Secondary | ICD-10-CM

## 2023-05-25 NOTE — Patient Instructions (Signed)
Goals Addressed             This Visit's Progress    Pharmacy Goals       Our goal A1c is less than 7%. This corresponds with fasting sugars less than 130 and 2 hour after meal sugars less than 180. Please keep a log of your results when checking your blood sugar    Our blood pressure goal is less than 130/80.  To appropriately check your blood pressure, make sure you do the following:  1) Avoid caffeine, exercise, or tobacco products for 30 minutes before checking. Empty your bladder. 2) Sit with your back supported in a flat-backed chair. Rest your arm on something flat (arm of the chair, table, etc). 3) Sit still with your feet flat on the floor, resting, for at least 5 minutes.  4) Check your blood pressure. Take 1-2 readings.  5) Write down these readings and bring with you to any provider appointments.  Bring your home blood pressure machine with you to a provider's office for accuracy comparison at least once a year.   Make sure you take your blood pressure medications before you come to any office visit, even if you were asked to fast for labs.  Our goal bad cholesterol, or LDL, is less than 70 . This is why it is important to continue taking your pravastatin.   Thank you!   Eaden Hettinger Tyshon Fanning, PharmD, BCACP, CPP Clinical Pharmacist [facility] [place] [phone]            

## 2023-05-25 NOTE — Progress Notes (Signed)
05/25/2023 Name: Russell [name]. MRN: [id number] DOB: 06/28/1951  Chief Complaint  Patient presents with   Medication Management    Russell [name]. is a 72 y.o. year old male who presented for a telephone visit.   They were referred to the pharmacist by their PCP for assistance in managing diabetes, hypertension, medication access, and complex medication management.    Subjective:   Care Team: Primary Care Provider: Smitty Cords, DO ; Next Scheduled Visit: 10/26/2023 Cardiologist: Antonieta Iba, MD Urology: Sondra Come, MD  Medication Access/Adherence  Current Pharmacy:  [facility] - [place], [place] - [address] [place] [address] Phone: [phone] Fax: [phone]  [facility] - [place], [place] - [place] [address] [place] [address] Phone: [phone] Fax: [phone]  [facility] - [place], [place] - [address] [place] [address] Phone: [phone] Fax: [phone]   Patient reports affordability concerns with their medications: No  Patient reports access/transportation concerns to their pharmacy: No  Patient reports adherence concerns with their medications:  No       Hypertension/Systolic CHF/Cardiomyopathy:   Patient followed by [facility]   Current medications:  - carvedilol 25 mg twice daily - isosorbide ER 60 mg - 1.5 tablets (90 mg) twice daily - Entresto 97/103 twice daily - spironolactone 25 mg daily - doxazosin 1 mg twice daily (reports self-restarted when stopped Jardiance) From review of chart note doxazosin not currently on patient's medication list and unable to find mentioned in latest Cardiology office visit note from 5/15 - furosemide 20 daily as needed for any ankle swelling or abdominal distention, weight gain  Previous therapies tried: Jardiance 10 mg daily (hypotension;  urinary symptoms)   Patient has an automated, upper arm home BP cuff Recent blood pressure readings readings from the past week: 128/70, 123/67, 130/72    Denies symptoms of hypotension such as dizziness or lightheadedness   Confirms weighs himself daily as recommended by Cardiologist - Denies s/sx of swelling at this time    Current medication access support:  - Enrolled in [employer] [facility]- Medicare Access fund grant through 01/14/2024             Note patient may reapply for grant on/after 12/15/2023  - Entresto patient assistance from [employer] (paperwork completed by Cardiology)  Current physical activity: Reports walking ~45 minutes outside/in the mall every other day   Diabetes:   Current medications:  - metformin ER 500 mg - 2 tablets daily   Recent morning fasting reading recently ranging: 134-152   Patient denies hypoglycemic s/sx including dizziness, shakiness, sweating.    Current physical activity: Reports walking ~45 minutes outside/in the mall every other day   Statin: pravastatin 10 mg daily     Objective:  Lab Results  Component Value Date   HGBA1C 7.2 (A) 04/19/2023    Lab Results  Component Value Date   CREATININE 1.11 04/26/2023   BUN 17 04/26/2023   NA 140 04/26/2023   K 4.9 04/26/2023   CL 108 04/26/2023   CO2 26 04/26/2023    Lab Results  Component Value Date   CHOL 159 10/11/2022   HDL 73 10/11/2022   LDLCALC 70 10/11/2022   TRIG 79 10/11/2022   CHOLHDL 2.2 10/11/2022   BP Readings from Last 3 Encounters:  05/09/23 120/64  04/26/23 118/78  04/19/23 118/68   Pulse  Readings from Last 3 Encounters:  05/09/23 (!) 58  04/26/23 (!) 59  04/19/23 (!) 56     Medications Reviewed Today     Reviewed by Manuela Neptune, RPH-CPP (Pharmacist) on 05/25/23 at 1032  Med List Status: <None>   Medication Order Taking? Sig Documenting Provider Last Dose Status Informant  Alcohol Swabs (DROPSAFE ALCOHOL PREP) 70 % PADS [id number]   Apply topically. [provider]  Active   amiodarone (PACERONE) 200 MG tablet [id number]  Take 0.5 tablets (100 mg total) by mouth daily. Sherie Don, NP  Active   aspirin 81 MG chewable tablet [id number]  Chew by mouth daily. [provider]  Active   BD SYRINGE SLIP TIP 25G X 5/8" 1 ML MISC [id number]   [provider]  Active   Blood Glucose Monitoring Suppl (ONE TOUCH ULTRA 2) w/Device KIT [id number]  Use to check blood sugar x 1 daily Karamalegos, Netta Neat, DO  Active   brimonidine (ALPHAGAN) 0.2 % ophthalmic solution [id number]  Place 1 drop into both eyes 2 (two) times daily. [provider]  Active   carvedilol (COREG) 25 MG tablet [id number] Yes Take 1 tablet (25 mg total) by mouth 2 (two) times daily. Antonieta Iba, MD Taking Active   Cholecalciferol (VITAMIN D3) 2000 units TABS [id number]  Take 1 capsule by mouth daily.  [provider]  Active Spouse/Significant Other  dorzolamide-timolol (COSOPT) 22.3-6.8 MG/ML ophthalmic solution [id number]  Place 1 drop into both eyes 2 (two) times daily. [provider]  Active   finasteride (PROSCAR) 5 MG tablet [id number]  Take 1 tablet (5 mg total) by mouth daily. Sondra Come, MD  Active   furosemide (LASIX) 20 MG tablet [id number]  Take 1 tablet (20 mg total) by mouth daily as needed. Antonieta Iba, MD  Active   isosorbide mononitrate (IMDUR) 60 MG 24 hr tablet [id number] Yes TAKE 1 AND 1/2 TABLETS TWICE DAILY (DOSE INCREASE) Mariah Milling Tollie Pizza, MD Taking Active   Lancets [facility] DELICA PLUS [place]) MISC [id number]  USE TO CHECK BLOOD SUGAR UP TO 1 TIME DAILY Smitty Cords, DO  Active   latanoprost (XALATAN) 0.005 % ophthalmic solution [id number]  Place 1 drop into both eyes at bedtime.  [provider]  Active Spouse/Significant Other  metFORMIN (GLUCOPHAGE-XR) 500 MG 24 hr tablet [id number] Yes Take 2 tablets (1,000 mg total) by mouth daily with breakfast.  Smitty Cords, DO Taking Active   nitroGLYCERIN (NITROSTAT) 0.4 MG SL tablet [id number]  DISSOLVE ONE TABLET UNDER THE TONGUE EVERY 5 MINUTES AS NEEDED FOR CHEST PAIN.  DO NOT EXCEED A TOTAL OF 3 DOSES IN [address] Tollie Pizza, MD  Active   [facility] ULTRA test strip [id number]  TEST BLOOD SUGAR ONE TIME DAILY Smitty Cords, DO  Active   polyvinyl alcohol (LIQUIFILM TEARS) 1.4 % ophthalmic solution [id number]  Place 2 drops into both eyes every 8 (eight) hours. Alford Highland, MD  Active   pravastatin (PRAVACHOL) 10 MG tablet [id number]  TAKE 1 TABLET AT BEDTIME Smitty Cords, DO  Active   sacubitril-valsartan (ENTRESTO) 97-103 MG [id number] Yes Take 1 tablet by mouth 2 (two) times daily. Creig Hines, NP Taking Active   spironolactone (ALDACTONE) 25 MG tablet [id number] Yes Take 1 tablet (25 mg total) by mouth daily. Antonieta Iba, MD Taking Active   tamsulosin [facility]) 0.4 MG CAPS capsule [id number]  Take 1 capsule (0.4 mg total) by mouth daily. Sondra Come, MD  Active               Assessment/Plan:   Hypertension/Systolic CHF/Cardiomyopathy: - Advise patient to contact Cardiology office today regarding his doxazosin prescription - to confirm provider aware that he restarted the doxazosin 1 mg twice daily when he stopped Jardiance and confirm if plan is for him to continue taking - Recommended to continue to check home blood pressure and heart rate as well as daily weight, keep log of results and have record to review during future appointments - Encourage patient to continue to take positional changes slowly - Have counseled patient on importance of taking his blood pressure medications before coming to his medical appointments    Diabetes: - Reviewed long term cardiovascular and renal outcomes of uncontrolled blood sugar - Reviewed goal A1c, goal fasting, and goal 2 hour post prandial glucose - Reviewed dietary modifications  including strategies for limiting carbohydrate portion sizes and increasing intake of non-starchy vegetables             Have encouraged patient to reduce intake of sugary beverages - Recommend to check glucose, keep log of results and have record to review during future appointments             Encouraged patient to use glucose checks as feedback on dietary choices     Follow Up Plan: Clinical Pharmacist will follow up with patient by telephone again on 08/24/2023 at 10:00 AM     Estelle Grumbles, PharmD, Patsy Baltimore, CPP Clinical Pharmacist [facility] [phone]

## 2023-05-25 NOTE — Telephone Encounter (Signed)
Spoke with patient and advised that we do not have this medication listed in his chart so we cannot refill at this time. He states that he had to stop Jardiance due to it making him feel bad. Once he stopped the Jardiance he then restarted the Doxazosin and taking 1 mg twice a day. States that he does not feel bad on that medication and blood pressures have been good.  Advised that I would forward this to provider to see if he is ok with him restarting the medication and if we can send this refill in. He verbalized understanding of our conversation with no further questions at this time.

## 2023-05-25 NOTE — Telephone Encounter (Signed)
Pt called in asking for a refill on Doxazosin since he does not take Jardiance but I do not see on med list anymore. Please advise.

## 2023-05-28 ENCOUNTER — Other Ambulatory Visit: Payer: Self-pay

## 2023-05-28 ENCOUNTER — Ambulatory Visit: Payer: Medicare HMO | Attending: Cardiology

## 2023-05-28 DIAGNOSIS — Z95810 Presence of automatic (implantable) cardiac defibrillator: Secondary | ICD-10-CM

## 2023-05-28 DIAGNOSIS — I5022 Chronic systolic (congestive) heart failure: Secondary | ICD-10-CM | POA: Diagnosis not present

## 2023-05-28 MED ORDER — DOXAZOSIN MESYLATE 1 MG PO TABS: 1.0000 mg | ORAL_TABLET | Freq: Two times a day (BID) | ORAL | 3 refills | Status: DC

## 2023-05-28 NOTE — Telephone Encounter (Signed)
Spoke to patient and relayed the provider's recommendations as follows:  "Okay to stay on the doxazosin 1 mg twice a day Would continue to monitor blood pressure For systolic pressure less than 110 would take Cardura/doxazosin only 1 mg once a day not twice a day Thx Tgollan"  Order for doxazosin (CARDURA) 1 MG tablet - Take 1 tablet (1 mg total) by mouth 2 (two) times daily placed

## 2023-05-31 ENCOUNTER — Telehealth: Payer: Self-pay

## 2023-05-31 NOTE — Telephone Encounter (Signed)
Remote ICM transmission received.  Attempted call to patient regarding ICM remote transmission and voice mail has not been set up.  

## 2023-05-31 NOTE — Progress Notes (Signed)
EPIC Encounter for ICM Monitoring  Patient Name: Capone Zawistowski. is a 72 y.o. male Date: 05/31/2023 Primary Care Physican: Smitty Cords, DO Primary Cardiologist: Mariah Milling Electrophysiologist: Lalla Brothers 11/24/2022 Weight: 165 lbs 04/27/2023 Weight: 159 lbs                                                          Attempted call to patient and unable to reach.   Transmission reviewed.    Diet:  Follows a low salt diet by eating a lot of fruits and vegetables and limiting fluid intake to 64 oz daily.   He rarely eats restaurants foods.     Optivol thoracic impedance suggesting normal fluid levels since 4/29.    Prescribed: Spironolactone 25 mg take 1 tablet daily   Labs: 04/26/2023 Creatinine 1.11, BUN 17, Potassium 4.9, Sodium 140, GFR >60 A complete set of results can be found in Results Review.   Recommendations:  Unable to reach.     Follow-up plan: ICM clinic phone appointment on 07/02/2023.   91 day device clinic remote transmission 08/06/2023.    EP/Cardiology Next Office Visit:  Recall 05/08/2024 with Dr Mariah Milling.  Recall 10/23/2023 with Sherie Don, NP.     Copy of ICM check sent to Dr. Lalla Brothers.   3 month ICM trend: 05/28/2023.    12-14 Month ICM trend:     [name], RN 05/31/2023 8:51 AM

## 2023-05-31 NOTE — Progress Notes (Signed)
Remote ICD transmission.   

## 2023-06-18 ENCOUNTER — Other Ambulatory Visit: Payer: Self-pay

## 2023-06-18 ENCOUNTER — Other Ambulatory Visit (HOSPITAL_COMMUNITY): Payer: Self-pay

## 2023-06-18 ENCOUNTER — Telehealth: Payer: Self-pay | Admitting: Cardiovascular Disease

## 2023-06-18 MED ORDER — SACUBITRIL-VALSARTAN 97-103 MG PO TABS: 1.0000 | ORAL_TABLET | Freq: Two times a day (BID) | ORAL | 1 refills | Status: DC

## 2023-06-18 NOTE — Telephone Encounter (Signed)
Patient is needing authorization for Entresto refill, note placed in box

## 2023-06-18 NOTE — Telephone Encounter (Signed)
Patient stated that he only needed a refill of sacubitril-valsartan (ENTRESTO) 97-103 MG sent to his pharmacy of choice. Informed patient he refill is sent. Patient understood with read back

## 2023-06-18 NOTE — Telephone Encounter (Signed)
Per test claim Russell [name] is paying the claim on Entresto. $45 for 1 month

## 2023-06-19 ENCOUNTER — Telehealth: Payer: Self-pay | Admitting: Cardiovascular Disease

## 2023-06-19 NOTE — Telephone Encounter (Signed)
Pt c/o medication issue:  1. Name of Medication:   sacubitril-valsartan (ENTRESTO) 97-103 MG    2. How are you currently taking this medication (dosage and times per day)?  Take 1 tablet by mouth 2 (two) times daily.       3. Are you having a reaction (difficulty breathing--STAT)? No  4. What is your medication issue? Pt stated he was told the refill for this medication would be sent in to the manufacturing company he requested [employer] which is through a program for him to get his medication for free. Pt called today stating they hadn't been contacted or received anything regarding this medication because it was sent to the wrong pharmacy yesterday instead. Pt stated he now has to call [facility] and cancel it due to him not wanting it sent to them. He stated the quickest way to have the refill request sent is to call [phone]. Pt is requesting a callback from nurse once this has been corrected. (Refer to 06/18/2023 phone note) Please advise

## 2023-06-19 NOTE — Telephone Encounter (Signed)
Spoke with patient and he stated that he needed new prescription for his Entresto sent to [employer]. He was approved for free Entresto but states they need updated script. Advised that I will call company to see what they need and that if they have everything they need then I would not call back and he should get the medication. He verbalized understanding with no further questions.    [employer] and gave verbal order for medication with refills. No further needs at this time. They will call patient with updates.

## 2023-07-02 ENCOUNTER — Ambulatory Visit: Payer: Medicare HMO | Attending: Cardiology

## 2023-07-02 DIAGNOSIS — Z95810 Presence of automatic (implantable) cardiac defibrillator: Secondary | ICD-10-CM

## 2023-07-02 DIAGNOSIS — I5022 Chronic systolic (congestive) heart failure: Secondary | ICD-10-CM | POA: Diagnosis not present

## 2023-07-06 NOTE — Progress Notes (Signed)
EPIC Encounter for ICM Monitoring  Patient Name: Russell [name]. is a 72 y.o. male Date: 07/06/2023 Primary Care Physican: Smitty Cords, DO Primary Cardiologist: Mariah Milling Electrophysiologist: Lalla Brothers 07/06/2023 Weight: 159 lbs                                                          Spoke with patient and heart failure questions reviewed.  Transmission results reviewed.  Pt asymptomatic for fluid accumulation.  Reports feeling well at this time and voices no complaints.     Diet:  Follows a low salt diet by eating a lot of fruits and vegetables and limiting fluid intake to 64 oz daily.   He rarely eats restaurants foods.     Optivol thoracic impedance suggesting normal fluid levels with the exception of possible fluid accumulation from 6/4-6/14 (on vacation).    Prescribed: Spironolactone 25 mg take 1 tablet daily   Labs: 04/26/2023 Creatinine 1.11, BUN 17, Potassium 4.9, Sodium 140, GFR >60 A complete set of results can be found in Results Review.   Recommendations:  No changes and encouraged to call if experiencing any fluid symptoms.    Follow-up plan: ICM clinic phone appointment on 08/07/2023.   91 day device clinic remote transmission 08/06/2023.    EP/Cardiology Next Office Visit:  Recall 05/08/2024 with Dr Mariah Milling.  Recall 10/23/2023 with Sherie Don, NP.     Copy of ICM check sent to Dr. Lalla Brothers.   3 month ICM trend: 07/02/2023.    12-14 Month ICM trend:     [name], RN 07/06/2023 2:25 PM

## 2023-07-13 ENCOUNTER — Ambulatory Visit (INDEPENDENT_AMBULATORY_CARE_PROVIDER_SITE_OTHER): Payer: Medicare HMO

## 2023-07-13 VITALS — Ht 68.0 in | Wt 159.0 lb

## 2023-07-13 DIAGNOSIS — Z0000 Encounter for general adult medical examination without abnormal findings: Secondary | ICD-10-CM

## 2023-07-13 NOTE — Patient Instructions (Signed)
Russell [name] , Thank you for taking time to come for your Medicare Wellness Visit. I appreciate your ongoing commitment to your health goals. Please review the following plan we discussed and let me know if I can assist you in the future.   These are the goals we discussed:  Goals      DIET - EAT MORE FRUITS AND VEGETABLES     DIET - INCREASE WATER INTAKE     Patient Stated     04/12/2021, live as long as possible     Pharmacy Goals     Our goal A1c is less than 7%. This corresponds with fasting sugars less than 130 and 2 hour after meal sugars less than 180. Please keep a log of your results when checking your blood sugar    Our blood pressure goal is less than 130/80.  To appropriately check your blood pressure, make sure you do the following:  1) Avoid caffeine, exercise, or tobacco products for 30 minutes before checking. Empty your bladder. 2) Sit with your back supported in a flat-backed chair. Rest your arm on something flat (arm of the chair, table, etc). 3) Sit still with your feet flat on the floor, resting, for at least 5 minutes.  4) Check your blood pressure. Take 1-2 readings.  5) Write down these readings and bring with you to any provider appointments.  Bring your home blood pressure machine with you to a provider's office for accuracy comparison at least once a year.   Make sure you take your blood pressure medications before you come to any office visit, even if you were asked to fast for labs.  Our goal bad cholesterol, or LDL, is less than 70 . This is why it is important to continue taking your pravastatin.   Thank you!   Estelle Grumbles, PharmD, [place], CPP Clinical Pharmacist [facility] [phone]            This is a list of the screening recommended for you and due dates:  Health Maintenance  Topic Date Due   DTaP/Tdap/Td vaccine (1 - Tdap) Never done   COVID-19 Vaccine (4 - 2023-24 season) 08/25/2022   Zoster  (Shingles) Vaccine (1 of 2) 07/19/2023*   Flu Shot  07/26/2023   Eye exam for diabetics  08/25/2023   Cologuard (Stool DNA test)  09/02/2023   Yearly kidney health urinalysis for diabetes  10/19/2023   Complete foot exam   10/19/2023   Hemoglobin A1C  10/19/2023   Yearly kidney function blood test for diabetes  04/25/2024   Medicare Annual Wellness Visit  07/12/2024   Pneumonia Vaccine  Completed   Hepatitis C Screening  Completed   HPV Vaccine  Aged Out   Colon Cancer Screening  Discontinued  *Topic was postponed. The date shown is not the original due date.    Advanced directives: no  Conditions/risks identified: none  Next appointment: Follow up in one year for your annual wellness visit. 07/18/24 @ 1:30 pm by phone  Preventive Care 65 Years and Older, Male  Preventive care refers to lifestyle choices and visits with your health care provider that can promote health and wellness. What does preventive care include? A yearly physical exam. This is also called an annual well check. Dental exams once or twice a year. Routine eye exams. Ask your health care provider how often you should have your eyes checked. Personal lifestyle choices, including: Daily care of your teeth and gums. Regular  physical activity. Eating a healthy diet. Avoiding tobacco and drug use. Limiting alcohol use. Practicing safe sex. Taking low doses of aspirin every day. Taking vitamin and mineral supplements as recommended by your health care provider. What happens during an annual well check? The services and screenings done by your health care provider during your annual well check will depend on your age, overall health, lifestyle risk factors, and family history of disease. Counseling  Your health care provider may ask you questions about your: Alcohol use. Tobacco use. Drug use. Emotional well-being. Home and relationship well-being. Sexual activity. Eating habits. History of falls. Memory  and ability to understand (cognition). Work and work Astronomer. Screening  You may have the following tests or measurements: Height, weight, and BMI. Blood pressure. Lipid and cholesterol levels. These may be checked every 5 years, or more frequently if you are over 32 years old. Skin check. Lung cancer screening. You may have this screening every year starting at age 45 if you have a 30-pack-year history of smoking and currently smoke or have quit within the past 15 years. Fecal occult blood test (FOBT) of the stool. You may have this test every year starting at age 8. Flexible sigmoidoscopy or colonoscopy. You may have a sigmoidoscopy every 5 years or a colonoscopy every 10 years starting at age 58. Prostate cancer screening. Recommendations will vary depending on your family history and other risks. Hepatitis C blood test. Hepatitis B blood test. Sexually transmitted disease (STD) testing. Diabetes screening. This is done by checking your blood sugar (glucose) after you have not eaten for a while (fasting). You may have this done every 1-3 years. Abdominal aortic aneurysm (AAA) screening. You may need this if you are a current or former smoker. Osteoporosis. You may be screened starting at age 37 if you are at high risk. Talk with your health care provider about your test results, treatment options, and if necessary, the need for more tests. Vaccines  Your health care provider may recommend certain vaccines, such as: Influenza vaccine. This is recommended every year. Tetanus, diphtheria, and acellular pertussis (Tdap, Td) vaccine. You may need a Td booster every 10 years. Zoster vaccine. You may need this after age 81. Pneumococcal 13-valent conjugate (PCV13) vaccine. One dose is recommended after age 6. Pneumococcal polysaccharide (PPSV23) vaccine. One dose is recommended after age 33. Talk to your health care provider about which screenings and vaccines you need and how often you  need them. This information is not intended to replace advice given to you by your health care provider. Make sure you discuss any questions you have with your health care provider. Document Released: 01/07/2016 Document Revised: 08/30/2016 Document Reviewed: 10/12/2015 Elsevier Interactive Patient Education  2017 [employer].  Fall Prevention in the Home Falls can cause injuries. They can happen to people of all ages. There are many things you can do to make your home safe and to help prevent falls. What can I do on the outside of my home? Regularly fix the edges of walkways and driveways and fix any cracks. Remove anything that might make you trip as you walk through a door, such as a raised step or threshold. Trim any bushes or trees on the path to your home. Use bright outdoor lighting. Clear any walking paths of anything that might make someone trip, such as rocks or tools. Regularly check to see if handrails are loose or broken. Make sure that both sides of any steps have handrails. Any raised decks  and porches should have guardrails on the edges. Have any leaves, snow, or ice cleared regularly. Use sand or salt on walking paths during winter. Clean up any spills in your garage right away. This includes oil or grease spills. What can I do in the bathroom? Use night lights. Install grab bars by the toilet and in the tub and shower. Do not use towel bars as grab bars. Use non-skid mats or decals in the tub or shower. If you need to sit down in the shower, use a plastic, non-slip stool. Keep the floor dry. Clean up any water that spills on the floor as soon as it happens. Remove soap buildup in the tub or shower regularly. Attach bath mats securely with double-sided non-slip rug tape. Do not have throw rugs and other things on the floor that can make you trip. What can I do in the bedroom? Use night lights. Make sure that you have a light by your bed that is easy to reach. Do not  use any sheets or blankets that are too big for your bed. They should not hang down onto the floor. Have a firm chair that has side arms. You can use this for support while you get dressed. Do not have throw rugs and other things on the floor that can make you trip. What can I do in the kitchen? Clean up any spills right away. Avoid walking on wet floors. Keep items that you use a lot in easy-to-reach places. If you need to reach something above you, use a strong step stool that has a grab bar. Keep electrical cords out of the way. Do not use floor polish or wax that makes floors slippery. If you must use wax, use non-skid floor wax. Do not have throw rugs and other things on the floor that can make you trip. What can I do with my stairs? Do not leave any items on the stairs. Make sure that there are handrails on both sides of the stairs and use them. Fix handrails that are broken or loose. Make sure that handrails are as long as the stairways. Check any carpeting to make sure that it is firmly attached to the stairs. Fix any carpet that is loose or worn. Avoid having throw rugs at the top or bottom of the stairs. If you do have throw rugs, attach them to the floor with carpet tape. Make sure that you have a light switch at the top of the stairs and the bottom of the stairs. If you do not have them, ask someone to add them for you. What else can I do to help prevent falls? Wear shoes that: Do not have high heels. Have rubber bottoms. Are comfortable and fit you well. Are closed at the toe. Do not wear sandals. If you use a stepladder: Make sure that it is fully opened. Do not climb a closed stepladder. Make sure that both sides of the stepladder are locked into place. Ask someone to hold it for you, if possible. Clearly mark and make sure that you can see: Any grab bars or handrails. First and last steps. Where the edge of each step is. Use tools that help you move around (mobility  aids) if they are needed. These include: Canes. Walkers. Scooters. Crutches. Turn on the lights when you go into a dark area. Replace any light bulbs as soon as they burn out. Set up your furniture so you have a clear path. Avoid moving your furniture around.  If any of your floors are uneven, fix them. If there are any pets around you, be aware of where they are. Review your medicines with your doctor. Some medicines can make you feel dizzy. This can increase your chance of falling. Ask your doctor what other things that you can do to help prevent falls. This information is not intended to replace advice given to you by your health care provider. Make sure you discuss any questions you have with your health care provider. Document Released: 10/07/2009 Document Revised: 05/18/2016 Document Reviewed: 01/15/2015 Elsevier Interactive Patient Education  2017 [employer].

## 2023-07-13 NOTE — Progress Notes (Signed)
Subjective:   Russell [name]. is a 72 y.o. male who presents for Medicare Annual/Subsequent preventive examination.  Per patient no change in vitals since last visit, unable to obtain new vitals due to telehealth visit   Visit Complete: Virtual  I connected with  Russell [name]. on 07/13/23 by a audio enabled telemedicine application and verified that I am speaking with the correct person using two identifiers.  Patient Location: Home  Provider Location: Office/Clinic  I discussed the limitations of evaluation and management by telemedicine. The patient expressed understanding and agreed to proceed.   Review of Systems     Cardiac Risk Factors include: advanced age (>37men, >70 women);diabetes mellitus;male gender;hypertension;dyslipidemia     Objective:    Today's Vitals   07/13/23 1110  Weight: 159 lb (72.1 kg)  Height: 5\' 8"  (1.727 m)   Body mass index is 24.18 kg/m.     07/13/2023   11:03 AM 10/05/2022    1:34 PM 04/21/2022    9:47 AM 04/12/2021   11:47 AM 05/07/2019   10:38 AM  Advanced Directives  Does Patient Have a Medical Advance Directive? No Yes Yes Yes No  Type of Advance Directive  Living will;Healthcare Power of [employer] Power of [place];Living will Healthcare Power of [place];Living will   Does patient want to make changes to medical advance directive?   Yes (Inpatient - patient defers changing a medical advance directive and declines information at this time)    Copy of Healthcare Power of Attorney in Chart?   No - copy requested No - copy requested   Would patient like information on creating a medical advance directive? No - Patient declined    No - Patient declined    Current Medications (verified) Outpatient Encounter Medications as of 07/13/2023  Medication Sig   Alcohol Swabs (DROPSAFE ALCOHOL PREP) 70 % PADS Apply topically.   amiodarone (PACERONE) 200 MG tablet Take 0.5 tablets (100 mg total) by mouth daily.   aspirin 81 MG chewable  tablet Chew by mouth daily.   BD SYRINGE SLIP TIP 25G X 5/8" 1 ML MISC    Blood Glucose Monitoring Suppl (ONE TOUCH ULTRA 2) w/Device KIT Use to check blood sugar x 1 daily   brimonidine (ALPHAGAN) 0.2 % ophthalmic solution Place 1 drop into both eyes 2 (two) times daily.   carvedilol (COREG) 25 MG tablet Take 1 tablet (25 mg total) by mouth 2 (two) times daily.   Cholecalciferol (VITAMIN D3) 2000 units TABS Take 1 capsule by mouth daily.    dorzolamide-timolol (COSOPT) 22.3-6.8 MG/ML ophthalmic solution Place 1 drop into both eyes 2 (two) times daily.   doxazosin (CARDURA) 1 MG tablet Take 1 tablet (1 mg total) by mouth 2 (two) times daily.   finasteride (PROSCAR) 5 MG tablet Take 1 tablet (5 mg total) by mouth daily.   furosemide (LASIX) 20 MG tablet Take 1 tablet (20 mg total) by mouth daily as needed.   isosorbide mononitrate (IMDUR) 60 MG 24 hr tablet TAKE 1 AND 1/2 TABLETS TWICE DAILY (DOSE INCREASE)   Lancets (ONETOUCH DELICA PLUS LANCET33G) MISC USE TO CHECK BLOOD SUGAR UP TO 1 TIME DAILY   latanoprost (XALATAN) 0.005 % ophthalmic solution Place 1 drop into both eyes at bedtime.    metFORMIN (GLUCOPHAGE-XR) 500 MG 24 hr tablet Take 2 tablets (1,000 mg total) by mouth daily with breakfast.   nitroGLYCERIN (NITROSTAT) 0.4 MG SL tablet DISSOLVE ONE TABLET UNDER THE TONGUE EVERY 5 MINUTES AS NEEDED FOR  CHEST PAIN.  DO NOT EXCEED A TOTAL OF 3 DOSES IN 15 MINUTES   ONETOUCH ULTRA test strip TEST BLOOD SUGAR ONE TIME DAILY   polyvinyl alcohol (LIQUIFILM TEARS) 1.4 % ophthalmic solution Place 2 drops into both eyes every 8 (eight) hours.   pravastatin (PRAVACHOL) 10 MG tablet TAKE 1 TABLET AT BEDTIME   sacubitril-valsartan (ENTRESTO) 97-103 MG Take 1 tablet by mouth 2 (two) times daily.   spironolactone (ALDACTONE) 25 MG tablet Take 1 tablet (25 mg total) by mouth daily.   tamsulosin (FLOMAX) 0.4 MG CAPS capsule Take 1 capsule (0.4 mg total) by mouth daily.   No facility-administered encounter  medications on file as of 07/13/2023.    Allergies (verified) Patient has no known allergies.   History: Past Medical History:  Diagnosis Date   CAD (coronary artery disease)    05/07/2019 LHC showed nonobstructive CAD   Diabetes (HCC)    Frequent PVCs    Heart failure with reduced ejection fraction (HCC)    04/2019 EF 20-25%   Hypertension    Impotence of organic origin    NICM (nonischemic cardiomyopathy) (HCC)    HFrEF in setting of NICM   Pain in joint, shoulder region    Personal history of colonic polyps    Ventricular tachycardia (HCC)    04/2019 in setting of cardiac arrest   Past Surgical History:  Procedure Laterality Date   CARDIAC CATHETERIZATION  11/17/2013   COLONOSCOPY  2009   ICD IMPLANT N/A 05/08/2019   Procedure: ICD IMPLANT;  Surgeon: Hillis Range, MD;  Location: [facility];  Service: Cardiovascular;  Laterality: N/A;   RIGHT/LEFT HEART CATH AND CORONARY ANGIOGRAPHY N/A 05/07/2019   Procedure: RIGHT/LEFT HEART CATH AND CORONARY ANGIOGRAPHY;  Surgeon: Yvonne Kendall, MD;  Location: [facility] INVASIVE CV LAB;  Service: Cardiovascular;  Laterality: N/A;   Family History  Problem Relation Age of Onset   Colon cancer Father        35's   Hypertension Mother    Diabetes Mother    Social History   Socioeconomic History   Marital status: Married    Spouse name: Not on file   Number of children: Not on file   Years of education: Not on file   Highest education level: Not on file  Occupational History   Occupation: [employer]   Occupation: retired  Tobacco Use   Smoking status: Never   Smokeless tobacco: Never  Vaping Use   Vaping status: Never Used  Substance and Sexual Activity   Alcohol use: Yes    Comment: occassional   Drug use: No   Sexual activity: Not on file  Other Topics Concern   Not on file  Social History Narrative   Not on file   Social Determinants of Health   Financial Resource Strain: Low Risk  (07/13/2023)    Overall Financial Resource Strain (CARDIA)    Difficulty of Paying Living Expenses: Not hard at all  Food Insecurity: No Food Insecurity (07/13/2023)   Hunger Vital Sign    Worried About Running Out of Food in the Last Year: Never true    Ran Out of Food in the Last Year: Never true  Transportation Needs: No Transportation Needs (07/13/2023)   PRAPARE - Administrator, Civil Service (Medical): No    Lack of Transportation (Non-Medical): No  Physical Activity: Insufficiently Active (07/13/2023)   Exercise Vital Sign    Days of Exercise per Week: 2 days  Minutes of Exercise per Session: 50 min  Stress: No Stress Concern Present (07/13/2023)   [employer] of Occupational Health - Occupational Stress Questionnaire    Feeling of Stress : Not at all  Social Connections: Moderately Integrated (07/13/2023)   Social Connection and Isolation Panel [NHANES]    Frequency of Communication with Friends and Family: More than three times a week    Frequency of Social Gatherings with Friends and Family: Once a week    Attends Religious Services: More than 4 times per year    Active Member of [employer] or Organizations: No    Attends Engineer, structural: Never    Marital Status: Married    Tobacco Counseling Counseling given: Not Answered   Clinical Intake:  Pre-visit preparation completed: Yes  Pain : No/denies pain     Nutritional Risks: None Diabetes: Yes CBG done?: No Did pt. bring in CBG monitor from home?: No  How often do you need to have someone help you when you read instructions, pamphlets, or other written materials from your doctor or pharmacy?: 1 - Never  Interpreter Needed?: No  Information entered by :: Kennedy Bucker, LPN   Activities of Daily Living    07/13/2023   11:03 AM 04/19/2023   10:01 AM  In your present state of health, do you have any difficulty performing the following activities:  Hearing? 0 0  Vision? 0 1  Difficulty  concentrating or making decisions? 0 0  Walking or climbing stairs? 0 0  Dressing or bathing? 0 0  Doing errands, shopping? 0 0  Preparing Food and eating ? N   Using the Toilet? N   In the past six months, have you accidently leaked urine? N   Do you have problems with loss of bowel control? N   Managing your Medications? N   Managing your Finances? N   Housekeeping or managing your Housekeeping? N     Patient Care Team: Smitty Cords, DO as PCP - General (Family Medicine) Lanier Prude, MD as PCP - Electrophysiology (Cardiology) Antonieta Iba, MD as PCP - Cardiology (Cardiology) Antonieta Iba, MD as Consulting Physician (Cardiology) Ronney Asters, Jackelyn Poling, RPH-CPP as Pharmacist  Indicate any recent Medical Services you may have received from other than [facility] providers in the past year (date may be approximate).     Assessment:   This is a routine wellness examination for Russell [name].  Hearing/Vision screen Hearing Screening - Comments:: No aids Vision Screening - Comments:: Wears glasses- Dr.King  Dietary issues and exercise activities discussed:     Goals Addressed             This Visit's Progress    DIET - INCREASE WATER INTAKE         Depression Screen    07/13/2023   11:02 AM 04/19/2023   10:01 AM 10/18/2022    9:57 AM 04/21/2022    9:46 AM 08/25/2021    9:08 AM 04/12/2021   11:47 AM 08/19/2020    9:15 AM  PHQ 2/9 Scores  PHQ - 2 Score 0 0 0 0 0 0 0  PHQ- 9 Score 0  0  0      Fall Risk    07/13/2023   11:03 AM 04/19/2023   10:01 AM 10/18/2022    9:57 AM 04/21/2022    9:48 AM 08/25/2021    9:08 AM  Fall Risk   Falls in the past year? 0 0 0 0 0  Number  falls in past yr: 0  0 0 0  Injury with Fall? 0  0 0 0  Risk for fall due to : No Fall Risks  No Fall Risks No Fall Risks   Follow up Falls prevention discussed;Falls evaluation completed  Falls evaluation completed Falls evaluation completed Falls evaluation completed    MEDICARE RISK AT  HOME:  Medicare Risk at Home - 07/13/23 1104     Any stairs in or around the home? Yes    If so, are there any without handrails? No    Home free of loose throw rugs in walkways, pet beds, electrical cords, etc? Yes    Adequate lighting in your home to reduce risk of falls? Yes    Life alert? No    Use of a cane, walker or w/c? No    Grab bars in the bathroom? Yes    Shower chair or bench in shower? No    Elevated toilet seat or a handicapped toilet? No             TIMED UP AND GO:  Was the test performed?  No    Cognitive Function:        07/13/2023   11:04 AM 04/12/2021   11:49 AM  6CIT Screen  What Year? 0 points 0 points  What month? 0 points 0 points  What time? 0 points 0 points  Count back from 20 0 points 0 points  Months in reverse 0 points 0 points  Repeat phrase 0 points 4 points  Total Score 0 points 4 points    Immunizations Immunization History  Administered Date(s) Administered   Fluad Quad(high Dose 65+) 10/20/2020, 09/23/2021, 10/18/2022   Influenza-Unspecified 09/17/2018   PFIZER(Purple Top)SARS-COV-2 Vaccination 02/04/2020, 02/25/2020, 10/12/2020   Pneumococcal Conjugate-13 08/22/2019   Pneumococcal Polysaccharide-23 08/25/2021    TDAP status: Due, Education has been provided regarding the importance of this vaccine. Advised may receive this vaccine at local pharmacy or Health Dept. Aware to provide a copy of the vaccination record if obtained from local pharmacy or Health Dept. Verbalized acceptance and understanding.  Flu Vaccine status: Up to date  Pneumococcal vaccine status: Up to date  Covid-19 vaccine status: Completed vaccines  Qualifies for Shingles Vaccine? Yes   Zostavax completed No   Shingrix Completed?: No.    Education has been provided regarding the importance of this vaccine. Patient has been advised to call insurance company to determine out of pocket expense if they have not yet received this vaccine. Advised may also  receive vaccine at local pharmacy or Health Dept. Verbalized acceptance and understanding.  Screening Tests Health Maintenance  Topic Date Due   DTaP/Tdap/Td (1 - Tdap) Never done   COVID-19 Vaccine (4 - 2023-24 season) 08/25/2022   Zoster Vaccines- Shingrix (1 of 2) 07/19/2023 (Originally 07/27/1970)   INFLUENZA VACCINE  07/26/2023   OPHTHALMOLOGY EXAM  08/25/2023   Fecal DNA (Cologuard)  09/02/2023   Diabetic kidney evaluation - Urine ACR  10/19/2023   FOOT EXAM  10/19/2023   HEMOGLOBIN A1C  10/19/2023   Diabetic kidney evaluation - eGFR measurement  04/25/2024   Medicare Annual Wellness (AWV)  07/12/2024   Pneumonia Vaccine 23+ Years old  Completed   Hepatitis C Screening  Completed   HPV VACCINES  Aged Out   Colonoscopy  Discontinued    Health Maintenance  Health Maintenance Due  Topic Date Due   DTaP/Tdap/Td (1 - Tdap) Never done   COVID-19 Vaccine (4 - 2023-24 season)  08/25/2022    Colorectal cancer screening: Type of screening: Cologuard. Completed 09/01/20. Repeat every 3 years  Lung Cancer Screening: (Low Dose CT Chest recommended if Age 45-80 years, 20 pack-year currently smoking OR have quit w/in 15years.) does not qualify.   Additional Screening:  Hepatitis C Screening: does qualify; Completed 06/18/18  Vision Screening: Recommended annual ophthalmology exams for early detection of glaucoma and other disorders of the eye. Is the patient up to date with their annual eye exam?  Yes  Who is the provider or what is the name of the office in which the patient attends annual eye exams? Dr.King If pt is not established with a provider, would they like to be referred to a provider to establish care? No .   Dental Screening: Recommended annual dental exams for proper oral hygiene  Diabetic Foot Exam: Diabetic Foot Exam: Completed 10/18/22  Community Resource Referral / Chronic Care Management: CRR required this visit?  No   CCM required this visit?  No     Plan:      I have personally reviewed and noted the following in the patient's chart:   Medical and social history Use of alcohol, tobacco or illicit drugs  Current medications and supplements including opioid prescriptions. Patient is not currently taking opioid prescriptions. Functional ability and status Nutritional status Physical activity Advanced directives List of other physicians Hospitalizations, surgeries, and ER visits in previous 12 months Vitals Screenings to include cognitive, depression, and falls Referrals and appointments  In addition, I have reviewed and discussed with patient certain preventive protocols, quality metrics, and best practice recommendations. A written personalized care plan for preventive services as well as general preventive health recommendations were provided to patient.     Hal Hope, LPN   01/12/1477   After Visit Summary: (MyChart) Due to this being a telephonic visit, the after visit summary with patients personalized plan was offered to patient via MyChart   Nurse Notes: none

## 2023-08-06 ENCOUNTER — Ambulatory Visit (INDEPENDENT_AMBULATORY_CARE_PROVIDER_SITE_OTHER): Payer: Medicare HMO

## 2023-08-06 DIAGNOSIS — I428 Other cardiomyopathies: Secondary | ICD-10-CM

## 2023-08-06 DIAGNOSIS — I5022 Chronic systolic (congestive) heart failure: Secondary | ICD-10-CM

## 2023-08-07 ENCOUNTER — Ambulatory Visit: Payer: Medicare HMO | Attending: Cardiology

## 2023-08-07 DIAGNOSIS — Z95810 Presence of automatic (implantable) cardiac defibrillator: Secondary | ICD-10-CM | POA: Diagnosis not present

## 2023-08-07 DIAGNOSIS — I5022 Chronic systolic (congestive) heart failure: Secondary | ICD-10-CM | POA: Diagnosis not present

## 2023-08-07 NOTE — Progress Notes (Signed)
EPIC Encounter for ICM Monitoring  Patient Name: Russell [name]. is a 72 y.o. male Date: 08/07/2023 Primary Care Physican: Smitty Cords, DO Primary Cardiologist: Mariah Milling Electrophysiologist: Lalla Brothers 07/06/2023 Weight: 159 lbs                                                          Spoke with patient and heart failure questions reviewed.  Transmission results reviewed.  Pt asymptomatic for fluid accumulation.  Reports feeling well at this time and voices no complaints.     Diet:  Follows a low salt diet by eating a lot of fruits and vegetables and limiting fluid intake to 64 oz daily.   He rarely eats restaurants foods.     Optivol thoracic impedance suggesting possible fluid accumulation starting 8/6.   Prescribed: Spironolactone 25 mg take 1 tablet daily Furosemide 20 mg take 1 tablet (20 mg total) by mouth daily as needed   Labs: 04/26/2023 Creatinine 1.11, BUN 17, Potassium 4.9, Sodium 140, GFR >60 A complete set of results can be found in Results Review.   Recommendations:  Advised to take 1 PRN Furosemide tablet and encouraged to limit salt intake.   Follow-up plan: ICM clinic phone appointment on 08/13/2023 to recheck fluid levels.   91 day device clinic remote transmission 08/06/2023.    EP/Cardiology Next Office Visit:  Recall 05/08/2024 with Dr Mariah Milling.  Recall 10/23/2023 with Sherie Don, NP.     Copy of ICM check sent to Dr. Lalla Brothers.   3 month ICM trend: 08/07/2023.    12-14 Month ICM trend:     [name], RN 08/07/2023 1:04 PM

## 2023-08-08 ENCOUNTER — Other Ambulatory Visit: Payer: Self-pay | Admitting: Cardiovascular Disease

## 2023-08-13 ENCOUNTER — Other Ambulatory Visit: Payer: Self-pay | Admitting: Cardiovascular Disease

## 2023-08-16 NOTE — Progress Notes (Signed)
No ICM remote transmission received for 08/13/2023 and next ICM transmission scheduled for 09/10/2023.

## 2023-08-20 NOTE — Progress Notes (Signed)
Remote ICD transmission.   

## 2023-08-23 DIAGNOSIS — H2513 Age-related nuclear cataract, bilateral: Secondary | ICD-10-CM | POA: Diagnosis not present

## 2023-08-23 DIAGNOSIS — H401113 Primary open-angle glaucoma, right eye, severe stage: Secondary | ICD-10-CM | POA: Diagnosis not present

## 2023-08-23 DIAGNOSIS — H401121 Primary open-angle glaucoma, left eye, mild stage: Secondary | ICD-10-CM | POA: Diagnosis not present

## 2023-08-23 DIAGNOSIS — E119 Type 2 diabetes mellitus without complications: Secondary | ICD-10-CM | POA: Diagnosis not present

## 2023-08-24 ENCOUNTER — Telehealth: Payer: Self-pay | Admitting: Pharmacist

## 2023-08-24 ENCOUNTER — Telehealth: Payer: Medicare HMO

## 2023-08-24 NOTE — Telephone Encounter (Signed)
   Outreach Note  08/24/2023 Name: Russell [name]. MRN: [id number] DOB: [birth year]  Referred by: Smitty Cords, DO  Was unable to reach patient via telephone today and have left HIPAA compliant voicemail asking patient to return my call.    Follow Up Plan: Will collaborate with Care Guide to outreach to schedule follow up with me  Estelle Grumbles, PharmD, [facility] [phone]

## 2023-09-10 DIAGNOSIS — Z1211 Encounter for screening for malignant neoplasm of colon: Secondary | ICD-10-CM | POA: Diagnosis not present

## 2023-09-13 NOTE — Progress Notes (Signed)
No ICM remote transmission received for 09/10/2023 and next ICM transmission scheduled for 10/08/2023.

## 2023-09-14 ENCOUNTER — Ambulatory Visit: Payer: Medicare HMO | Admitting: Pharmacist

## 2023-09-14 DIAGNOSIS — E1136 Type 2 diabetes mellitus with diabetic cataract: Secondary | ICD-10-CM

## 2023-09-14 LAB — COLOGUARD: COLOGUARD: NEGATIVE

## 2023-09-14 NOTE — Progress Notes (Unsigned)
09/14/2023 Name: Russell [name]. MRN: [id number] DOB: [birth year]  Chief Complaint  Patient presents with   Medication Management    Russell [name]. is a 72 y.o. year old male who presented for a telephone visit.   They were referred to the pharmacist by their PCP for assistance in managing diabetes, hypertension, medication access, and complex medication management.    Subjective:   Care Team: Primary Care Provider: Smitty Cords, DO ; Next Scheduled Visit: 10/26/2023 Cardiologist: Antonieta Iba, MD Urology: Sondra Come, MD  Medication Access/Adherence  Current Pharmacy:  [name] - [place], [name] - [address] [name] [address] Phone: [phone] Fax: [phone]  [facility] - [place], [name] - [place] [address] [name] [address] Phone: [phone] Fax: [phone]  [facility] - [name], [name] - [address] [name] [address] Phone: [phone] Fax: [phone]   Patient reports affordability concerns with their medications: No  Patient reports access/transportation concerns to their pharmacy: No  Patient reports adherence concerns with their medications:  No     Hypertension/Systolic CHF/Cardiomyopathy:   Patient followed by [facility]   Current medications:  - carvedilol 25 mg twice daily - isosorbide ER 60 mg - 1.5 tablets (90 mg) twice daily - Entresto 97/103 twice daily - spironolactone 25 mg daily - doxazosin 1 mg twice daily  - furosemide 20 daily as needed for any ankle swelling or abdominal distention, weight gain   Previous therapies tried: Jardiance 10 mg daily (hypotension; urinary symptoms)   Patient has an automated, upper arm home BP cuff Recent blood pressure readings  9/18: 118/74, HR 61 9/15: 116/74, HR 71 9/13: 122/72, HR 69   Denies symptoms of hypotension such  as dizziness or lightheadedness    Confirms weighs himself daily as recommended by Cardiologist - Denies s/sx of swelling at this time    Current medication access support:  - Enrolled in [employer] [facility]- Medicare Access fund grant through 01/14/2024             Note patient may reapply for grant on/after 12/15/2023  - Entresto patient assistance from [employer] (paperwork completed by Cardiology)   Current physical activity: Reports started back walking ~40 minutes every other day   Diabetes:   Current medications:  - metformin ER 500 mg - 2 tablets daily  Reports tolerating well   Recent morning fasting reading recently ranging: 143-154   Patient denies hypoglycemic s/sx including dizziness, shakiness, sweating.   Reports staying away from soda now, drinking water instead   Current physical activity: Reports started back walking ~40 minutes every other day   Statin: pravastatin 10 mg daily   Objective:  Lab Results  Component Value Date   HGBA1C 7.2 (A) 04/19/2023    Lab Results  Component Value Date   CREATININE 1.11 04/26/2023   BUN 17 04/26/2023   NA 140 04/26/2023   K 4.9 04/26/2023   CL 108 04/26/2023   CO2 26 04/26/2023    Lab Results  Component Value Date   CHOL 159 10/11/2022   HDL 73 10/11/2022   LDLCALC 70 10/11/2022   TRIG 79 10/11/2022   CHOLHDL 2.2 10/11/2022   BP Readings from Last 3 Encounters:  05/09/23 120/64  04/26/23 118/78  04/19/23 118/68   Pulse Readings from Last 3 Encounters:  05/09/23 (!) 58  04/26/23 (!) 59  04/19/23 (!) 56     Medications Reviewed Today     Reviewed by Manuela Neptune, RPH-CPP (Pharmacist) on 09/14/23 at [phone]  Med List Status: <None>   Medication Order Taking? Sig Documenting Provider Last Dose Status Informant  Alcohol Swabs (DROPSAFE ALCOHOL PREP) 70 % PADS [id number]  Apply topically. [provider]  Active   amiodarone (PACERONE) 200 MG tablet [id number]  Take 0.5 tablets (100  mg total) by mouth daily. Sherie Don, NP  Active   aspirin 81 MG chewable tablet [id number]  Chew by mouth daily. [provider]  Active   BD SYRINGE SLIP TIP 25G X 5/8" 1 ML MISC [id number]   [provider]  Active   Blood Glucose Monitoring Suppl (ONE TOUCH ULTRA 2) w/Device KIT [id number]  Use to check blood sugar x 1 daily Karamalegos, Netta Neat, DO  Active   brimonidine (ALPHAGAN) 0.2 % ophthalmic solution [id number]  Place 1 drop into both eyes 2 (two) times daily. [provider]  Active   carvedilol (COREG) 25 MG tablet [id number] Yes Take 1 tablet (25 mg total) by mouth 2 (two) times daily. Antonieta Iba, MD Taking Active   Cholecalciferol (VITAMIN D3) 2000 units TABS [id number]  Take 1 capsule by mouth daily.  [provider]  Active Spouse/Significant Other  dorzolamide-timolol (COSOPT) 22.3-6.8 MG/ML ophthalmic solution [id number]  Place 1 drop into both eyes 2 (two) times daily. [provider]  Active   doxazosin (CARDURA) 1 MG tablet [id number] Yes Take 1 tablet (1 mg total) by mouth 2 (two) times daily. Antonieta Iba, MD Taking Active   finasteride (PROSCAR) 5 MG tablet [id number]  Take 1 tablet (5 mg total) by mouth daily. Sondra Come, MD  Active   furosemide (LASIX) 20 MG tablet [id number] Yes Take 1 tablet (20 mg total) by mouth daily as needed. Antonieta Iba, MD Taking Active   isosorbide mononitrate (IMDUR) 60 MG 24 hr tablet [id number] Yes TAKE 1 AND 1/2 TABLETS TWICE DAILY Gollan, Tollie Pizza, MD Taking Active   Lancets [facility] DELICA PLUS [place]) MISC [id number]  USE TO CHECK BLOOD SUGAR UP TO 1 TIME DAILY Russell Cords, DO  Active   latanoprost (XALATAN) 0.005 % ophthalmic solution [id number]  Place 1 drop into both eyes at bedtime.  [provider]  Active Spouse/Significant Other  metFORMIN (GLUCOPHAGE-XR) 500 MG 24 hr tablet [id number] Yes Take 2 tablets (1,000 mg total) by mouth daily with  breakfast. Russell Cords, DO Taking Active   nitroGLYCERIN (NITROSTAT) 0.4 MG SL tablet [id number]  DISSOLVE ONE TABLET UNDER THE TONGUE EVERY 5 MINUTES AS NEEDED FOR CHEST PAIN.  DO NOT EXCEED A TOTAL OF 3 DOSES IN [address] [place], Tollie Pizza, MD  Active   [facility] ULTRA test strip [id number]  TEST BLOOD SUGAR ONE TIME DAILY Russell Cords, DO  Active   polyvinyl alcohol (LIQUIFILM TEARS) 1.4 % ophthalmic solution [id number]  Place 2 drops into both eyes every 8 (eight) hours. Alford Highland, MD  Active   pravastatin (PRAVACHOL) 10 MG tablet [id number] Yes TAKE 1 TABLET AT BEDTIME Karamalegos, Netta Neat, DO Taking Active   sacubitril-valsartan (ENTRESTO) 97-103 MG [id number] Yes Take 1 tablet by mouth 2 (two) times daily. Antonieta Iba, MD Taking Active   spironolactone (ALDACTONE) 25 MG tablet [id number] Yes TAKE 1 TABLET EVERY DAY Gollan, Tollie Pizza, MD Taking Active   tamsulosin (FLOMAX) 0.4 MG CAPS capsule [id number]  Take 1 capsule (0.4 mg total)  by mouth daily. Sondra Come, MD  Active               Assessment/Plan:   Hypertension/Systolic CHF/Cardiomyopathy: - Recommended to continue to check home blood pressure and heart rate as well as daily weight, keep log of results and have record to review during future appointments - Encourage patient to continue to take positional changes slowly - Have counseled patient on importance of taking his blood pressure medications before coming to his medical appointments    Diabetes: - Reviewed long term cardiovascular and renal outcomes of uncontrolled blood sugar - Reviewed goal A1c, goal fasting, and goal 2 hour post prandial glucose - Reviewed dietary modifications including strategies for limiting carbohydrate portion sizes and increasing intake of non-starchy vegetables             Have encouraged patient to reduce intake of sugary beverages - Recommend to check glucose, keep log of results and have record to  review during future appointments             Encouraged patient to use glucose checks as feedback on dietary choices     Follow Up Plan: Clinical Pharmacist will follow up with patient by telephone within the next 7 days   Estelle Grumbles, PharmD, Patsy [place], CPP Clinical Pharmacist [facility] [phone]

## 2023-09-18 MED ORDER — METFORMIN HCL ER 500 MG PO TB24
ORAL_TABLET | ORAL | 0 refills | Status: DC
Start: 2023-09-18 — End: 2023-10-26

## 2023-09-18 NOTE — Patient Instructions (Signed)
Goals Addressed             This Visit's Progress    Pharmacy Goals       Our goal A1c is less than 7%. This corresponds with fasting sugars less than 130 and 2 hour after meal sugars less than 180. Please keep a log of your results when checking your blood sugar    Our blood pressure goal is less than 130/80.  To appropriately check your blood pressure, make sure you do the following:  1) Avoid caffeine, exercise, or tobacco products for 30 minutes before checking. Empty your bladder. 2) Sit with your back supported in a flat-backed chair. Rest your arm on something flat (arm of the chair, table, etc). 3) Sit still with your feet flat on the floor, resting, for at least 5 minutes.  4) Check your blood pressure. Take 1-2 readings.  5) Write down these readings and bring with you to any provider appointments.  Bring your home blood pressure machine with you to a provider's office for accuracy comparison at least once a year.   Make sure you take your blood pressure medications before you come to any office visit, even if you were asked to fast for labs.  Our goal bad cholesterol, or LDL, is less than 70 . This is why it is important to continue taking your pravastatin.   Thank you!   Estelle Grumbles, PharmD, P[place]y [place], CPP Clinical Pharmacist [facility] [phone]

## 2023-10-02 ENCOUNTER — Ambulatory Visit (INDEPENDENT_AMBULATORY_CARE_PROVIDER_SITE_OTHER): Payer: Medicare HMO

## 2023-10-02 DIAGNOSIS — Z23 Encounter for immunization: Secondary | ICD-10-CM

## 2023-10-08 ENCOUNTER — Ambulatory Visit: Payer: Medicare HMO | Attending: Cardiology

## 2023-10-08 DIAGNOSIS — Z95810 Presence of automatic (implantable) cardiac defibrillator: Secondary | ICD-10-CM | POA: Diagnosis not present

## 2023-10-08 DIAGNOSIS — I5022 Chronic systolic (congestive) heart failure: Secondary | ICD-10-CM

## 2023-10-12 NOTE — Progress Notes (Signed)
EPIC Encounter for ICM Monitoring  Patient Name: Russell [name]. is a 72 y.o. male Date: 10/12/2023 Primary Care Physican: Smitty Cords, DO Primary Cardiologist: Mariah Milling Electrophysiologist: Lalla Brothers 07/06/2023 Weight: 159 lbs 10/12/2023 Weight: 158-159 lbs                                                          Spoke with patient and heart failure questions reviewed.  Transmission results reviewed.  Pt asymptomatic for fluid accumulation.  Reports feeling well at this time and voices no complaints.  He has been walking for exercise.   Diet:  Follows a low salt diet by eating a lot of fruits and vegetables and limiting fluid intake to 64 oz daily.   He rarely eats restaurants foods.     Optivol thoracic impedance suggesting normal fluid levels since 9/11.   Prescribed: Spironolactone 25 mg take 1 tablet daily Furosemide 20 mg take 1 tablet (20 mg total) by mouth daily as needed   Labs: 04/26/2023 Creatinine 1.11, BUN 17, Potassium 4.9, Sodium 140, GFR >60 A complete set of results can be found in Results Review.   Recommendations:  No changes and encouraged to call if experiencing any fluid symptoms.   Follow-up plan: ICM clinic phone appointment on 11/12/2023.   91 day device clinic remote transmission 11/05/2023.    EP/Cardiology Next Office Visit:  Recall 05/08/2024 with Dr Mariah Milling.  Provided EP scheduler number.   Recall 10/23/2023 with Sherie Don, NP (6 month f/u).     Copy of ICM check sent to Dr. Lalla Brothers.   3 month ICM trend: 10/08/2023.    12-14 Month ICM trend:     [name], RN 10/12/2023 12:36 PM

## 2023-10-16 ENCOUNTER — Telehealth: Payer: Self-pay | Admitting: Urology

## 2023-10-16 DIAGNOSIS — N401 Enlarged prostate with lower urinary tract symptoms: Secondary | ICD-10-CM

## 2023-10-16 MED ORDER — FINASTERIDE 5 MG PO TABS
5.0000 mg | ORAL_TABLET | Freq: Every day | ORAL | 3 refills | Status: DC
Start: 2023-10-16 — End: 2024-01-29

## 2023-10-16 NOTE — Telephone Encounter (Signed)
[facility] lvm requesting refill for Finasteride 5 mg. Their phone number is [phone] and fax number is [phone]

## 2023-10-16 NOTE — Telephone Encounter (Signed)
Rx sent to mail order per request.

## 2023-10-16 NOTE — Progress Notes (Unsigned)
Cardiology Office Note Date:  10/17/2023  Patient ID:  Russell Cocca., DOB 17-Jul-1951, MRN [id number] PCP:  Smitty Cords, DO  Cardiologist:  Julien Nordmann, MD Electrophysiologist: Dr. Johney Frame > Lanier Prude, MD    Chief Complaint: 6 month follow-up  History of Present Illness: Russell Solt. is a 72 y.o. male with PMH notable for HFrEF, VT s/p ICD, PVC, T2DM, non-obs CAD, HTN, cardiac arrest; seen today for Lanier Prude, MD for routine electrophysiology followup.   I saw him 04/2023 where he was doing well, walking couple miles most days of the week. Optivol elevated, but did not appreciate on exam. BNP low. He is followed by ICM monthly.  On follow-up today, he continues to feel well. Denies palpitations, chest pain, chest pressure. Continues to walk several miles a day for exercise.  He denies edema or weight gain. No increased satiety. Has not taken PRN lasix ever.   Device Information: MDT single chamber ICD, imp 04/2019, dx HFrEF, VT  AAD History: Amiodarone   Past Medical History:  Diagnosis Date   CAD (coronary artery disease)    05/07/2019 LHC showed nonobstructive CAD   Diabetes (HCC)    Frequent PVCs    Heart failure with reduced ejection fraction (HCC)    04/2019 EF 20-25%   Hypertension    Impotence of organic origin    NICM (nonischemic cardiomyopathy) (HCC)    HFrEF in setting of NICM   Pain in joint, shoulder region    Personal history of colonic polyps    Ventricular tachycardia (HCC)    04/2019 in setting of cardiac arrest    Past Surgical History:  Procedure Laterality Date   CARDIAC CATHETERIZATION  11/17/2013   COLONOSCOPY  2009   ICD IMPLANT N/A 05/08/2019   Procedure: ICD IMPLANT;  Surgeon: Hillis Range, MD;  Location: [facility] INVASIVE CV LAB;  Service: Cardiovascular;  Laterality: N/A;   RIGHT/LEFT HEART CATH AND CORONARY ANGIOGRAPHY N/A 05/07/2019   Procedure: RIGHT/LEFT HEART CATH AND CORONARY ANGIOGRAPHY;  Surgeon: Yvonne Kendall, MD;  Location: [facility] INVASIVE CV LAB;  Service: Cardiovascular;  Laterality: N/A;    Current Outpatient Medications  Medication Instructions   Alcohol Swabs (DROPSAFE ALCOHOL PREP) 70 % PADS Topical   amiodarone (PACERONE) 100 mg, Oral, Daily   aspirin 81 MG chewable tablet Oral, Daily   BD SYRINGE SLIP TIP 25G X 5/8" 1 ML MISC    Blood Glucose Monitoring Suppl (ONE TOUCH ULTRA 2) w/Device KIT Use to check blood sugar x 1 daily   brimonidine (ALPHAGAN) 0.2 % ophthalmic solution 1 drop, Both Eyes, 2 times daily   carvedilol (COREG) 25 mg, Oral, 2 times daily   Cholecalciferol (VITAMIN D3) 2000 units TABS 1 capsule, Oral, Daily   dorzolamide-timolol (COSOPT) 22.3-6.8 MG/ML ophthalmic solution 1 drop, Both Eyes, 2 times daily   doxazosin (CARDURA) 1 mg, Oral, 2 times daily   finasteride (PROSCAR) 5 mg, Oral, Daily   furosemide (LASIX) 20 mg, Oral, Daily PRN   isosorbide mononitrate (IMDUR) 60 MG 24 hr tablet TAKE 1 AND 1/2 TABLETS TWICE DAILY   Lancets (ONETOUCH DELICA PLUS LANCET33G) MISC USE TO CHECK BLOOD SUGAR UP TO 1 TIME DAILY   latanoprost (XALATAN) 0.005 % ophthalmic solution 1 drop, Both Eyes, Daily at bedtime   metFORMIN (GLUCOPHAGE-XR) 500 MG 24 hr tablet Take 2 tablets daily with breakfast and 1 tablet daily with supper   nitroGLYCERIN (NITROSTAT) 0.4 MG SL tablet DISSOLVE ONE TABLET UNDER THE TONGUE EVERY  5 MINUTES AS NEEDED FOR CHEST PAIN.  DO NOT EXCEED A TOTAL OF 3 DOSES IN 15 MINUTES   ONETOUCH ULTRA test strip TEST BLOOD SUGAR ONE TIME DAILY   polyvinyl alcohol (LIQUIFILM TEARS) 1.4 % ophthalmic solution 2 drops, Both Eyes, Every 8 hours   pravastatin (PRAVACHOL) 10 MG tablet TAKE 1 TABLET AT BEDTIME   sacubitril-valsartan (ENTRESTO) 97-103 MG 1 tablet, Oral, 2 times daily   spironolactone (ALDACTONE) 25 mg, Oral, Daily   tamsulosin (FLOMAX) 0.4 mg, Oral, Daily    Social History:  The patient  reports that he has never smoked. He has never used smokeless  tobacco. He reports current alcohol use. He reports that he does not use drugs.   Family History: The patient's family history includes Colon cancer in his father; Diabetes in his mother; Hypertension in his mother.  ROS:  Please see the history of present illness. All other systems are reviewed and otherwise negative.   PHYSICAL EXAM:  VS:  BP 124/80 (BP Location: Left Arm, Patient Position: Sitting, Cuff Size: Normal)   Pulse 61   Ht 5\' 8"  (1.727 m)   Wt 155 lb 3.2 oz (70.4 kg)   SpO2 96%   BMI 23.60 kg/m  BMI: Body mass index is 23.6 kg/m.  GEN- The patient is well appearing, alert and oriented x 3 today.   Lungs- Clear to ausculation bilaterally, normal work of breathing.  Heart- Regular rate and rhythm, no murmurs, rubs or gallops Extremities- No peripheral edema, warm, dry Skin-   device pocket well-healed, no tethering  Device interrogation done today and reviewed by myself:  Battery 7.4 years Lead thresholds, impedence, sensing stable  No episodes No changes made today  EKG is ordered. Personal review of EKG from today shows: EKG Interpretation Date/Time:  Wednesday October 17 2023 10:55:20 EDT Ventricular Rate:  61 PR Interval:  176 QRS Duration:  82 QT Interval:  442 QTC Calculation: 444 R Axis:   9  Text Interpretation: Normal sinus rhythm T wave abnormality, consider lateral ischemia Confirmed by Sherie Don [phone]) on 10/17/2023 11:02:43 AM    Recent Labs: 04/26/2023: ALT 14; B Natriuretic Peptide 56.5; BUN 17; Creatinine, Ser 1.11; Potassium 4.9; Sodium 140; TSH 0.744  No results found for requested labs within last 365 days.   CrCl cannot be calculated (Patient's most recent lab result is older than the maximum 21 days allowed.).   Wt Readings from Last 3 Encounters:  10/17/23 155 lb 3.2 oz (70.4 kg)  07/13/23 159 lb (72.1 kg)  05/09/23 159 lb 2 oz (72.2 kg)     Additional studies reviewed include: Previous EP, cardiology notes.   TTE,  09/12/2019 1. Left ventricular ejection fraction, by visual estimation, is 30 to 35%. The left ventricle has moderate to severely decreased function. Normal left ventricular size. There is no left ventricular hypertrophy. Global hypokinesis, unable to exclude regional wall motion abnormality.   2. Left ventricular diastolic Doppler parameters are consistent with impaired relaxation pattern of LV diastolic filling.   3. Global right ventricle has normal systolic function.The right ventricular size is normal. No increase in right ventricular wall thickness.   4. Normal pulmonary artery systolic pressure.   5. Left atrial size was mildly dilated.   6. Mild mitral valve regurgitation.   7. Tricuspid valve regurgitation is mild.   8. A pacer wire is visualized.   TTE, 05/04/2019  1. The left ventricle has severely reduced systolic function, with an  ejection fraction of 20-25%.  The cavity size was moderately dilated. There  is mildly increased left ventricular wall thickness. Left ventricular diastolic Doppler parameters are consistent with impaired relaxation.   2. Aortic valve regurgitation is trivial by color flow Doppler. No stenosis of the aortic valve.   3. The interatrial septum was not assessed.    ASSESSMENT AND PLAN:  #) VT s/p MDT ICD Device functioning well, see paceart for details No VT by device OptiVol slightly elevated Continue amiodarone 100mg  daily  - update amio labs Continue 25mg  coreg BID  #) HFrEF NYHA class II symptoms Warm and dry on exam with good activity tolerance GDMT: coreg, entresto, spiro -- did not tolerate jardiance Diuretic: lasix PRN, but has never taken Continue to monitor fluid status, do not appreciate - update Echo   Current medicines are reviewed at length with the patient today.   The patient does not have concerns regarding his medicines.  The following changes were made today:  none  Labs/ tests ordered today include:  Orders Placed This  Encounter  Procedures   Comprehensive metabolic panel   T4, free   TSH   EKG 12-Lead   ECHOCARDIOGRAM COMPLETE     Disposition: Follow up with Dr. Lalla Brothers or EP APP in 6 months   Signed, Sherie Don, NP  10/17/23  11:35 AM  Electrophysiology [facility]

## 2023-10-17 ENCOUNTER — Encounter: Payer: Self-pay | Admitting: Cardiology

## 2023-10-17 ENCOUNTER — Ambulatory Visit: Payer: Medicare HMO | Attending: Cardiology | Admitting: Cardiology

## 2023-10-17 VITALS — BP 124/80 | HR 61 | Ht 68.0 in | Wt 155.2 lb

## 2023-10-17 DIAGNOSIS — I472 Ventricular tachycardia, unspecified: Secondary | ICD-10-CM | POA: Diagnosis not present

## 2023-10-17 DIAGNOSIS — Z79899 Other long term (current) drug therapy: Secondary | ICD-10-CM

## 2023-10-17 DIAGNOSIS — I5022 Chronic systolic (congestive) heart failure: Secondary | ICD-10-CM | POA: Diagnosis not present

## 2023-10-17 DIAGNOSIS — Z95810 Presence of automatic (implantable) cardiac defibrillator: Secondary | ICD-10-CM

## 2023-10-17 LAB — CUP PACEART INCLINIC DEVICE CHECK
Date Time Interrogation Session: 20241023123347
Implantable Lead Connection Status: 753985
Implantable Lead Implant Date: 20200514
Implantable Lead Location: 753860
Implantable Lead Model: 6935
Implantable Pulse Generator Implant Date: 20200514

## 2023-10-17 NOTE — Patient Instructions (Signed)
Medication Instructions:  The current medical regimen is effective;  continue present plan and medications.  *If you need a refill on your cardiac medications before your next appointment, please call your pharmacy*   Lab Work: Your provider would like for you to have following labs drawn today CMET, TSH, FREE T4.   If you have labs (blood work) drawn today and your tests are completely normal, you will receive your results only by: MyChart Message (if you have MyChart) OR A paper copy in the mail If you have any lab test that is abnormal or we need to change your treatment, we will call you to review the results.   Testing/Procedures: Your physician has requested that you have an echocardiogram. Echocardiography is a painless test that uses sound waves to create images of your heart. It provides your doctor with information about the size and shape of your heart and how well your heart's chambers and valves are working.   You may receive an ultrasound enhancing agent through an IV if needed to better visualize your heart during the echo. This procedure takes approximately one hour.  There are no restrictions for this procedure.  This will take place at [address] [address] [facility], [place] [address]    Follow-Up: At [facility], you and your health needs are our priority.  As part of our continuing mission to provide you with exceptional heart care, we have created designated Provider Care Teams.  These Care Teams include your primary Cardiologist (physician) and Advanced Practice Providers (APPs -  Physician Assistants and Nurse Practitioners) who all work together to provide you with the care you need, when you need it.  We recommend signing up for the patient portal called "MyChart".  Sign up information is provided on this After Visit Summary.  MyChart is used to connect with patients for Virtual Visits (Telemedicine).  Patients are able to view lab/test  results, encounter notes, upcoming appointments, etc.  Non-urgent messages can be sent to your provider as well.   To learn more about what you can do with MyChart, go to [email or web address].    Your next appointment:   6 month(s)  Provider:   Steffanie Dunn, MD or Sherie Don, NP

## 2023-10-18 LAB — COMPREHENSIVE METABOLIC PANEL
ALT: 10 [IU]/L (ref 0–44)
AST: 13 [IU]/L (ref 0–40)
Albumin: 4 g/dL (ref 3.9–4.9)
Alkaline Phosphatase: 42 [IU]/L — ABNORMAL LOW (ref 44–121)
BUN/Creatinine Ratio: 11 (ref 10–24)
BUN: 15 mg/dL (ref 8–27)
Bilirubin Total: 0.3 mg/dL (ref 0.0–1.2)
CO2: 22 mmol/L (ref 20–29)
Calcium: 9.1 mg/dL (ref 8.6–10.2)
Chloride: 105 mmol/L (ref 96–106)
Creatinine, Ser: 1.32 mg/dL — ABNORMAL HIGH (ref 0.76–1.27)
Globulin, Total: 2.6 g/dL (ref 1.5–4.5)
Glucose: 191 mg/dL — ABNORMAL HIGH (ref 70–99)
Potassium: 4.7 mmol/L (ref 3.5–5.2)
Sodium: 140 mmol/L (ref 134–144)
Total Protein: 6.6 g/dL (ref 6.0–8.5)
eGFR: 58 mL/min/{1.73_m2} — ABNORMAL LOW (ref >59)

## 2023-10-18 LAB — TSH: TSH: 0.798 u[IU]/mL (ref 0.450–4.500)

## 2023-10-18 LAB — T4, FREE: Free T4: 1.67 ng/dL (ref 0.82–1.77)

## 2023-10-19 ENCOUNTER — Other Ambulatory Visit: Payer: Medicare HMO

## 2023-10-19 DIAGNOSIS — R7989 Other specified abnormal findings of blood chemistry: Secondary | ICD-10-CM | POA: Diagnosis not present

## 2023-10-19 DIAGNOSIS — E1169 Type 2 diabetes mellitus with other specified complication: Secondary | ICD-10-CM | POA: Diagnosis not present

## 2023-10-19 DIAGNOSIS — Z0000 Encounter for general adult medical examination without abnormal findings: Secondary | ICD-10-CM | POA: Diagnosis not present

## 2023-10-19 DIAGNOSIS — E785 Hyperlipidemia, unspecified: Secondary | ICD-10-CM | POA: Diagnosis not present

## 2023-10-19 DIAGNOSIS — E1136 Type 2 diabetes mellitus with diabetic cataract: Secondary | ICD-10-CM

## 2023-10-19 DIAGNOSIS — I10 Essential (primary) hypertension: Secondary | ICD-10-CM

## 2023-10-20 ENCOUNTER — Other Ambulatory Visit: Payer: Self-pay | Admitting: Family Medicine

## 2023-10-20 DIAGNOSIS — E1136 Type 2 diabetes mellitus with diabetic cataract: Secondary | ICD-10-CM

## 2023-10-20 LAB — CBC WITH DIFFERENTIAL/PLATELET
Absolute Lymphocytes: 1457 {cells}/uL (ref 850–3900)
Absolute Monocytes: 632 {cells}/uL (ref 200–950)
Basophils Absolute: 31 {cells}/uL (ref 0–200)
Basophils Relative: 0.5 %
Eosinophils Absolute: 223 {cells}/uL (ref 15–500)
Eosinophils Relative: 3.6 %
HCT: 36.5 % — ABNORMAL LOW (ref 38.5–50.0)
Hemoglobin: 11.8 g/dL — ABNORMAL LOW (ref 13.2–17.1)
MCH: 31.4 pg (ref 27.0–33.0)
MCHC: 32.3 g/dL (ref 32.0–36.0)
MCV: 97.1 fL (ref 80.0–100.0)
MPV: 11.4 fL (ref 7.5–12.5)
Monocytes Relative: 10.2 %
Neutro Abs: 3856 {cells}/uL (ref 1500–7800)
Neutrophils Relative %: 62.2 %
Platelets: 134 10*3/uL — ABNORMAL LOW (ref 140–400)
RBC: 3.76 10*6/uL — ABNORMAL LOW (ref 4.20–5.80)
RDW: 13.9 % (ref 11.0–15.0)
Total Lymphocyte: 23.5 %
WBC: 6.2 10*3/uL (ref 3.8–10.8)

## 2023-10-20 LAB — COMPLETE METABOLIC PANEL WITH GFR
AG Ratio: 1.6 (calc) (ref 1.0–2.5)
ALT: 8 U/L — ABNORMAL LOW (ref 9–46)
AST: 9 U/L — ABNORMAL LOW (ref 10–35)
Albumin: 3.9 g/dL (ref 3.6–5.1)
Alkaline phosphatase (APISO): 37 U/L (ref 35–144)
BUN/Creatinine Ratio: 13 (calc) (ref 6–22)
BUN: 17 mg/dL (ref 7–25)
CO2: 28 mmol/L (ref 20–32)
Calcium: 9.3 mg/dL (ref 8.6–10.3)
Chloride: 106 mmol/L (ref 98–110)
Creat: 1.32 mg/dL — ABNORMAL HIGH (ref 0.70–1.28)
Globulin: 2.5 g/dL (ref 1.9–3.7)
Glucose, Bld: 126 mg/dL — ABNORMAL HIGH (ref 65–99)
Potassium: 4.3 mmol/L (ref 3.5–5.3)
Sodium: 141 mmol/L (ref 135–146)
Total Bilirubin: 0.5 mg/dL (ref 0.2–1.2)
Total Protein: 6.4 g/dL (ref 6.1–8.1)
eGFR: 58 mL/min/{1.73_m2} — ABNORMAL LOW (ref >=60)

## 2023-10-20 LAB — LIPID PANEL
Cholesterol: 140 mg/dL (ref <200)
HDL: 56 mg/dL (ref >=40)
LDL Cholesterol (Calc): 68 mg/dL
Non-HDL Cholesterol (Calc): 84 mg/dL (ref <130)
Total CHOL/HDL Ratio: 2.5 (calc) (ref <5.0)
Triglycerides: 82 mg/dL (ref <150)

## 2023-10-20 LAB — HEMOGLOBIN A1C
Hgb A1c MFr Bld: 7.9 %{Hb} — ABNORMAL HIGH (ref <5.7)
Mean Plasma Glucose: 180 mg/dL
eAG (mmol/L): 10 mmol/L

## 2023-10-20 LAB — TSH: TSH: 1.45 m[IU]/L (ref 0.40–4.50)

## 2023-10-20 LAB — T4, FREE: Free T4: 1.5 ng/dL (ref 0.8–1.8)

## 2023-10-22 NOTE — Telephone Encounter (Signed)
Requested Prescriptions  Pending Prescriptions Disp Refills   pravastatin (PRAVACHOL) 10 MG tablet [Pharmacy Med Name: Pravastatin Sodium Oral Tablet 10 MG] 90 tablet 1    Sig: TAKE 1 TABLET AT BEDTIME     Cardiovascular:  Antilipid - Statins Failed - 10/20/2023  9:11 AM      Failed - Lipid Panel in normal range within the last 12 months    Cholesterol, Total  Date Value Ref Range Status  04/13/2017 152  Final   Cholesterol  Date Value Ref Range Status  10/19/2023 140 <200 mg/dL Final   LDL Cholesterol (Calc)  Date Value Ref Range Status  10/19/2023 68 mg/dL (calc) Final    Comment:    Reference range: <100 . Desirable range <100 mg/dL for primary prevention;   <70 mg/dL for patients with CHD or diabetic patients  with > or = 2 CHD risk factors. Marland Kitchen LDL-C is now calculated using the Martin-Hopkins  calculation, which is a validated novel method providing  better accuracy than the Friedewald equation in the  estimation of LDL-C.  Horald Pollen et al. Lenox Ahr. 4098;119(14): 2061-2068  (http://education.[employer].com/faq/FAQ164)    HDL Cholesterol  Date Value Ref Range Status  04/13/2017 64  Final   HDL  Date Value Ref Range Status  10/19/2023 56 > OR = 40 mg/dL Final   Triglycerides  Date Value Ref Range Status  10/19/2023 82 <150 mg/dL Final  78/29/5621 56  Final         Passed - Patient is not pregnant      Passed - Valid encounter within last 12 months    Recent Outpatient Visits           1 month ago 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 [facility])   [place] [facility] Delles, Gentry Fitz A, RPH-CPP   5 months ago 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 [facility])   [place] [facility] Delles, Gentry Fitz A, RPH-CPP   6 months ago 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 [facility])   [place] [facility] Smitty Cords, DO   7 months ago Chronic systolic heart failure [facility])    [place] [facility] Delles, Gentry Fitz A, RPH-CPP   8 months ago Chronic systolic heart failure [facility])   [place] [facility] Delles, Jackelyn Poling, RPH-CPP       Future Appointments             In 4 days Althea Charon, Netta Neat, DO [place] [facility]   In 3 months Richardo Hanks, Laurette Schimke, MD [facility] [place]

## 2023-10-26 ENCOUNTER — Encounter: Payer: Self-pay | Admitting: Family Medicine

## 2023-10-26 ENCOUNTER — Ambulatory Visit (INDEPENDENT_AMBULATORY_CARE_PROVIDER_SITE_OTHER): Payer: Medicare HMO | Admitting: Family Medicine

## 2023-10-26 ENCOUNTER — Other Ambulatory Visit: Payer: Self-pay | Admitting: Family Medicine

## 2023-10-26 VITALS — BP 124/72 | HR 79 | Ht 66.5 in | Wt 156.0 lb

## 2023-10-26 DIAGNOSIS — I10 Essential (primary) hypertension: Secondary | ICD-10-CM | POA: Diagnosis not present

## 2023-10-26 DIAGNOSIS — Z0000 Encounter for general adult medical examination without abnormal findings: Secondary | ICD-10-CM | POA: Diagnosis not present

## 2023-10-26 DIAGNOSIS — I428 Other cardiomyopathies: Secondary | ICD-10-CM | POA: Diagnosis not present

## 2023-10-26 DIAGNOSIS — E1136 Type 2 diabetes mellitus with diabetic cataract: Secondary | ICD-10-CM | POA: Diagnosis not present

## 2023-10-26 DIAGNOSIS — I5022 Chronic systolic (congestive) heart failure: Secondary | ICD-10-CM

## 2023-10-26 DIAGNOSIS — Z95810 Presence of automatic (implantable) cardiac defibrillator: Secondary | ICD-10-CM

## 2023-10-26 DIAGNOSIS — I25118 Atherosclerotic heart disease of native coronary artery with other forms of angina pectoris: Secondary | ICD-10-CM

## 2023-10-26 DIAGNOSIS — D649 Anemia, unspecified: Secondary | ICD-10-CM

## 2023-10-26 MED ORDER — PRAVASTATIN SODIUM 10 MG PO TABS
10.0000 mg | ORAL_TABLET | Freq: Every day | ORAL | 3 refills | Status: DC
Start: 2023-10-26 — End: 2024-08-12

## 2023-10-26 MED ORDER — METFORMIN HCL ER 500 MG PO TB24
1000.0000 mg | ORAL_TABLET | Freq: Every day | ORAL | 3 refills | Status: DC
Start: 2023-10-26 — End: 2024-08-12

## 2023-10-26 NOTE — Patient Instructions (Addendum)
Thank you for coming to the office today.  Recent Labs    04/19/23 0932 10/19/23 0830  HGBA1C 7.2* 7.9*   Keep improving diet lifestyle  Kidney function is stable, slightly reduced. We will monitor this closely.  Refilled Metformin XR 500 x 2 = 1000mg  DAILY, skip the PM dose  Consider Prevagen supplement OTC or online for memory  DUE for NON FASTING BLOOD WORK   SCHEDULE "Lab Only" visit in the morning at the clinic for lab draw in 6 MONTHS   - Make sure Lab Only appointment is at about 1 week before your next appointment, so that results will be available  For Lab Results, once available within 2-3 days of blood draw, you can can log in to MyChart online to view your results and a brief explanation. Also, we can discuss results at next follow-up visit.   Please schedule a Follow-up Appointment to: Return in about 6 months (around 04/24/2024) for 6 month non fasting lab then 1 week later Follow-up DM, Anemia, CKD.  If you have any other questions or concerns, please feel free to call the office or send a message through MyChart. You may also schedule an earlier appointment if necessary.  Additionally, you may be receiving a survey about your experience at our office within a few days to 1 week by e-mail or mail. We value your feedback.  Saralyn Pilar, DO [facility], [place]

## 2023-10-26 NOTE — Assessment & Plan Note (Addendum)
A1c up to 7.9 No evidence of hypoglycemia Complications - DM cataracts, glaucoma, other including atherosclerotic vascular disease - increases risk of future cardiovascular complications  Off Jardiance - side effect  Plan: 1. Continue Metformin XR 500 TWICE A DAY 2. Encourage improved lifestyle - low carb, low sugar diet, reduce portion size, continue improving regular exercise - reviewed diet recommendation 3. Check CBG, bring log to next visit for review 4. Continue ACEi, ASA, Statin DM Foot Urine micro

## 2023-10-26 NOTE — Progress Notes (Signed)
Subjective:    Patient ID: Russell Joy., male    DOB: 1951/01/22, 72 y.o.   MRN: [id number]  Russell Burroughs. is a 72 y.o. male presenting on 10/26/2023 for Annual Exam   HPI  Here for Annual Physical and Lab Review  Discussed the use of AI scribe software for clinical note transcription with the patient, who gave verbal consent to proceed.     The patient, with a history of diabetes, kidney disease, and anemia, presents for an annual check-up. He reports no new health issues since the last visit six months ago. 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 Reports good compliance. Tolerating well w/o side-effects Currently on ACEi He is tolerating it well. On Pravastatin 10mg  nightly, no side effects or myalgia Improved LDL on statin Has OneTouch Ultra 2  Lifestyle:  - Diet (Improving diet, less sweets, less sugar, no sodas) - Exercise (walking Followed by [facility] Dr Brooke Dare see below, for glaucoma and cataracts Next Eye Exam January 2025 Denies hypoglycemia, polyuria, visual changes, numbness or tingling.  Elevated Cr Last lab Creatinine 1.32 slight elevated but stable 1.1 to 1.3   CHRONIC HTN / Systolic CHF / Cardiomyopathy Followed by Cardiology Current Meds - Carvedilol 25mg  BID, Spironolcatone 25mg  Entrestro 97-103 BID, Isosorbide mononitrate 60  On Amiodarone Reports good compliance, took meds today. Tolerating well, w/o complaints. Lifestyle: Denies CP, dyspnea, HA, edema, dizziness / lightheadedness   BPH LUTS Followed by Dr Richardo Hanks [facility] [place] He continues on Alpha Blocker and Finasteride, he has had good results with improved urinary symptoms Prostate MRI has been normal. They are monitoring PSA   Anemia,  Normocytic Hgb 11.8 mild reduced He denies any significant fatigue or changes in energy levels.     Health Maintenance:   Flu Shot updated   Future COVID and Shingrix vaccines   Colon CA Screening: Last Colonoscopy 09/24/2013 (done by Dr Lemar Livings), results with no polyps, good for 10 years. Currently asymptomatic. known family history of colon CA  Cologuard done 09/10/23 - negative. Good for 3 years. 2027       10/26/2023   10:38 AM 07/13/2023   11:02 AM 04/19/2023   10:01 AM  Depression screen PHQ 2/9  Decreased Interest 0 0 0  Down, Depressed, Hopeless 0 0 0  PHQ - 2 Score 0 0 0  Altered sleeping 0 0   Tired, decreased energy 1 0   Change in appetite 0 0   Feeling bad or failure about yourself  0 0   Trouble concentrating 0 0   Moving slowly or fidgety/restless 0 0   Suicidal thoughts 0 0   PHQ-9 Score 1 0   Difficult doing work/chores Not difficult at all Not difficult at all     Past Medical History:  Diagnosis Date   CAD (coronary artery disease)    05/07/2019 LHC showed nonobstructive CAD   Diabetes (HCC)    Frequent PVCs    Heart failure with reduced ejection fraction (HCC)    04/2019 EF 20-25%   Hypertension    Impotence of organic origin    NICM (nonischemic cardiomyopathy) (HCC)    HFrEF in setting of NICM   Pain in joint, shoulder region  Personal history of colonic polyps    Ventricular tachycardia (HCC)    04/2019 in setting of cardiac arrest   Past Surgical History:  Procedure Laterality Date   CARDIAC CATHETERIZATION  11/17/2013   COLONOSCOPY  2009   ICD IMPLANT N/A 05/08/2019   Procedure: ICD IMPLANT;  Surgeon: Hillis Range, MD;  Location: [facility] INVASIVE CV LAB;  Service: Cardiovascular;  Laterality: N/A;   RIGHT/LEFT HEART CATH AND CORONARY ANGIOGRAPHY N/A 05/07/2019   Procedure: RIGHT/LEFT HEART CATH AND CORONARY ANGIOGRAPHY;  Surgeon: Yvonne Kendall, MD;  Location: [facility] INVASIVE CV LAB;  Service: Cardiovascular;  Laterality: N/A;   Social  History   Socioeconomic History   Marital status: Married    Spouse name: Not on file   Number of children: Not on file   Years of education: Not on file   Highest education level: Not on file  Occupational History   Occupation: [employer]   Occupation: retired  Tobacco Use   Smoking status: Never   Smokeless tobacco: Never  Vaping Use   Vaping status: Never Used  Substance and Sexual Activity   Alcohol use: Yes    Comment: occassional   Drug use: No   Sexual activity: Not on file  Other Topics Concern   Not on file  Social History Narrative   Not on file   Social Determinants of Health   Financial Resource Strain: Low Risk  (07/13/2023)   Overall Financial Resource Strain (CARDIA)    Difficulty of Paying Living Expenses: Not hard at all  Food Insecurity: No Food Insecurity (07/13/2023)   Hunger Vital Sign    Worried About Running Out of Food in the Last Year: Never true    Ran Out of Food in the Last Year: Never true  Transportation Needs: No Transportation Needs (07/13/2023)   PRAPARE - Administrator, Civil Service (Medical): No    Lack of Transportation (Non-Medical): No  Physical Activity: Insufficiently Active (07/13/2023)   Exercise Vital Sign    Days of Exercise per Week: 2 days    Minutes of Exercise per Session: 50 min  Stress: No Stress Concern Present (07/13/2023)   [employer] of Occupational Health - Occupational Stress Questionnaire    Feeling of Stress : Not at all  Social Connections: Moderately Integrated (07/13/2023)   Social Connection and Isolation Panel [NHANES]    Frequency of Communication with Friends and Family: More than three times a week    Frequency of Social Gatherings with Friends and Family: Once a week    Attends Religious Services: More than 4 times per year    Active Member of [employer] or Organizations: No    Attends Banker Meetings: Never    Marital Status: Married  Catering manager Violence: Not At  Risk (07/13/2023)   Humiliation, Afraid, Rape, and Kick questionnaire    Fear of Current or Ex-Partner: No    Emotionally Abused: No    Physically Abused: No    Sexually Abused: No   Family History  Problem Relation Age of Onset   Colon cancer Father        1970's   Hypertension Mother    Diabetes Mother    Current Outpatient Medications on File Prior to Visit  Medication Sig   amiodarone (PACERONE) 200 MG tablet Take 0.5 tablets (100 mg total) by mouth daily.   aspirin 81 MG chewable tablet Chew by mouth daily.   brimonidine (ALPHAGAN) 0.2 % ophthalmic solution Place 1 drop  into both eyes 2 (two) times daily.   carvedilol (COREG) 25 MG tablet Take 1 tablet (25 mg total) by mouth 2 (two) times daily.   Cholecalciferol (VITAMIN D3) 2000 units TABS Take 1 capsule by mouth daily.    dorzolamide-timolol (COSOPT) 22.3-6.8 MG/ML ophthalmic solution Place 1 drop into both eyes 2 (two) times daily.   doxazosin (CARDURA) 1 MG tablet Take 1 tablet (1 mg total) by mouth 2 (two) times daily.   finasteride (PROSCAR) 5 MG tablet Take 1 tablet (5 mg total) by mouth daily.   furosemide (LASIX) 20 MG tablet Take 1 tablet (20 mg total) by mouth daily as needed.   isosorbide mononitrate (IMDUR) 60 MG 24 hr tablet TAKE 1 AND 1/2 TABLETS TWICE DAILY   latanoprost (XALATAN) 0.005 % ophthalmic solution Place 1 drop into both eyes at bedtime.    ONETOUCH ULTRA test strip TEST BLOOD SUGAR ONE TIME DAILY   sacubitril-valsartan (ENTRESTO) 97-103 MG Take 1 tablet by mouth 2 (two) times daily.   spironolactone (ALDACTONE) 25 MG tablet TAKE 1 TABLET EVERY DAY   tamsulosin (FLOMAX) 0.4 MG CAPS capsule Take 1 capsule (0.4 mg total) by mouth daily.   Alcohol Swabs (DROPSAFE ALCOHOL PREP) 70 % PADS Apply topically.   BD SYRINGE SLIP TIP 25G X 5/8" 1 ML MISC    Blood Glucose Monitoring Suppl (ONE TOUCH ULTRA 2) w/Device KIT Use to check blood sugar x 1 daily   Lancets (ONETOUCH DELICA PLUS LANCET33G) MISC USE TO CHECK  BLOOD SUGAR UP TO 1 TIME DAILY   nitroGLYCERIN (NITROSTAT) 0.4 MG SL tablet DISSOLVE ONE TABLET UNDER THE TONGUE EVERY 5 MINUTES AS NEEDED FOR CHEST PAIN.  DO NOT EXCEED A TOTAL OF 3 DOSES IN 15 MINUTES (Patient not taking: Reported on 10/26/2023)   No current facility-administered medications on file prior to visit.    Review of Systems  Constitutional:  Negative for activity change, appetite change, chills, diaphoresis, fatigue and fever.  HENT:  Negative for congestion and hearing loss.   Eyes:  Negative for visual disturbance.  Respiratory:  Negative for cough, chest tightness, shortness of breath and wheezing.   Cardiovascular:  Negative for chest pain, palpitations and leg swelling.  Gastrointestinal:  Negative for abdominal pain, constipation, diarrhea, nausea and vomiting.  Genitourinary:  Negative for dysuria, frequency and hematuria.  Musculoskeletal:  Negative for arthralgias and neck pain.  Skin:  Negative for rash.  Neurological:  Negative for dizziness, weakness, light-headedness, numbness and headaches.  Hematological:  Negative for adenopathy.  Psychiatric/Behavioral:  Negative for behavioral problems, dysphoric mood and sleep disturbance.    Per HPI unless specifically indicated above     Objective:    BP 124/72   Pulse 79   Ht 5' 6.5" (1.689 m)   Wt 156 lb (70.8 kg)   SpO2 99%   BMI 24.80 kg/m   Wt Readings from Last 3 Encounters:  10/26/23 156 lb (70.8 kg)  10/17/23 155 lb 3.2 oz (70.4 kg)  07/13/23 159 lb (72.1 kg)    Physical Exam Vitals and nursing note reviewed.  Constitutional:      General: He is not in acute distress.    Appearance: He is well-developed. He is not diaphoretic.     Comments: Well-appearing, comfortable, cooperative  HENT:     Head: Normocephalic and atraumatic.  Eyes:     General:        Right eye: No discharge.        Left eye: No discharge.  Conjunctiva/sclera: Conjunctivae normal.     Pupils: Pupils are equal, round, and  reactive to light.  Neck:     Thyroid: No thyromegaly.     Vascular: No carotid bruit.  Cardiovascular:     Rate and Rhythm: Normal rate and regular rhythm.     Pulses: Normal pulses.     Heart sounds: Normal heart sounds. No murmur heard. Pulmonary:     Effort: Pulmonary effort is normal. No respiratory distress.     Breath sounds: Normal breath sounds. No wheezing or rales.  Abdominal:     General: Bowel sounds are normal. There is no distension.     Palpations: Abdomen is soft. There is no mass.     Tenderness: There is no abdominal tenderness.  Musculoskeletal:        General: No tenderness. Normal range of motion.     Cervical back: Normal range of motion and neck supple.     Right lower leg: No edema.     Left lower leg: No edema.     Comments: Upper / Lower Extremities: - Normal muscle tone, strength bilateral upper extremities 5/5, lower extremities 5/5  Lymphadenopathy:     Cervical: No cervical adenopathy.  Skin:    General: Skin is warm and dry.     Findings: No erythema or rash.  Neurological:     Mental Status: He is alert and oriented to person, place, and time.     Comments: Distal sensation intact to light touch all extremities  Psychiatric:        Mood and Affect: Mood normal.        Behavior: Behavior normal.        Thought Content: Thought content normal.     Comments: Well groomed, good eye contact, normal speech and thoughts    Diabetic Foot Exam - Simple   Simple Foot Form Diabetic Foot exam was performed with the following findings: Yes 10/26/2023 11:00 AM  Visual Inspection See comments: Yes Sensation Testing Intact to touch and monofilament testing bilaterally: Yes Pulse Check Posterior Tibialis and Dorsalis pulse intact bilaterally: Yes Comments Bilateral callus formation forefoot great toe and heel. Intact monofilament.       Results for orders placed or performed in visit on 10/19/23  T4, free  Result Value Ref Range   Free T4 1.5 0.8  - 1.8 ng/dL  TSH  Result Value Ref Range   TSH 1.45 0.40 - 4.50 mIU/L  Lipid panel  Result Value Ref Range   Cholesterol 140 <200 mg/dL   HDL 56 > OR = 40 mg/dL   Triglycerides 82 <161 mg/dL   LDL Cholesterol (Calc) 68 mg/dL (calc)   Total CHOL/HDL Ratio 2.5 <5.0 (calc)   Non-HDL Cholesterol (Calc) 84 <096 mg/dL (calc)  Hemoglobin E4V  Result Value Ref Range   Hgb A1c MFr Bld 7.9 (H) <5.7 % of total Hgb   Mean Plasma Glucose 180 mg/dL   eAG (mmol/L) 40.9 mmol/L  CBC with Differential/Platelet  Result Value Ref Range   WBC 6.2 3.8 - 10.8 Thousand/uL   RBC 3.76 (L) 4.20 - 5.80 Million/uL   Hemoglobin 11.8 (L) 13.2 - 17.1 g/dL   HCT 81.1 (L) 91.4 - 78.2 %   MCV 97.1 80.0 - 100.0 fL   MCH 31.4 27.0 - 33.0 pg   MCHC 32.3 32.0 - 36.0 g/dL   RDW 95.6 21.3 - 08.6 %   Platelets 134 (L) 140 - 400 Thousand/uL   MPV 11.4 7.5 -  12.5 fL   Neutro Abs 3,856 1,500 - 7,800 cells/uL   Absolute Lymphocytes 1,457 850 - 3,900 cells/uL   Absolute Monocytes 632 200 - 950 cells/uL   Eosinophils Absolute 223 15 - 500 cells/uL   Basophils Absolute 31 0 - 200 cells/uL   Neutrophils Relative % 62.2 %   Total Lymphocyte 23.5 %   Monocytes Relative 10.2 %   Eosinophils Relative 3.6 %   Basophils Relative 0.5 %  COMPLETE METABOLIC PANEL WITH GFR  Result Value Ref Range   Glucose, Bld 126 (H) 65 - 99 mg/dL   BUN 17 7 - 25 mg/dL   Creat 1.61 (H) 0.96 - 1.28 mg/dL   eGFR 58 (L) > OR = 60 mL/min/1.61m2   BUN/Creatinine Ratio 13 6 - 22 (calc)   Sodium 141 135 - 146 mmol/L   Potassium 4.3 3.5 - 5.3 mmol/L   Chloride 106 98 - 110 mmol/L   CO2 28 20 - 32 mmol/L   Calcium 9.3 8.6 - 10.3 mg/dL   Total Protein 6.4 6.1 - 8.1 g/dL   Albumin 3.9 3.6 - 5.1 g/dL   Globulin 2.5 1.9 - 3.7 g/dL (calc)   AG Ratio 1.6 1.0 - 2.5 (calc)   Total Bilirubin 0.5 0.2 - 1.2 mg/dL   Alkaline phosphatase (APISO) 37 35 - 144 U/L   AST 9 (L) 10 - 35 U/L   ALT 8 (L) 9 - 46 U/L      Assessment & Plan:   Problem List  Items Addressed This Visit     CAD (coronary artery disease)   Relevant Medications   pravastatin (PRAVACHOL) 10 MG tablet   Chronic systolic heart failure (HCC)   Relevant Medications   pravastatin (PRAVACHOL) 10 MG tablet   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 Continue Metformin XR 500 TWICE A DAY 2. Encourage improved lifestyle - low carb, low sugar diet, reduce portion size, continue improving regular exercise - reviewed diet recommendation 3. Check CBG, bring log to next visit for review 4. Continue ACEi, ASA, Statin DM Foot Urine micro      Relevant Medications   metFORMIN (GLUCOPHAGE-XR) 500 MG 24 hr tablet   pravastatin (PRAVACHOL) 10 MG tablet   Other Relevant Orders   Urine Microalbumin w/creat. ratio   Essential hypertension   Relevant Medications   pravastatin (PRAVACHOL) 10 MG tablet   ICD (implantable cardioverter-defibrillator) in place   Nonischemic cardiomyopathy (HCC)   Relevant Medications   pravastatin (PRAVACHOL) 10 MG tablet   Other Visit Diagnoses     Annual physical exam    -  Primary       Updated Health Maintenance information Reviewed recent lab results with patient Encouraged improvement to lifestyle with diet and exercise Goal of weight loss   Chronic Kidney Disease Stable, slightly reduced kidney function. Creatinine 1.3, slightly elevated. Patient on diuretics for fluid management. -Monitor kidney function closely. -Collect urine sample today for proteinuria screening.  Mild Anemia Hemoglobin 11.8, slightly reduced. No reported symptoms of fatigue or low energy. -Monitor hemoglobin levels. -Order iron panel at 65-month follow-up.  Hyperlipidemia Well controlled. -Continue  Pravastatin.  General Health Maintenance -Eye exam scheduled for January 2025. -COVID booster available at pharmacy, optional. -Colonoscopy completed September 2024, next due 2027. -Consider Prevagen  supplement for memory support, available OTC. -Follow-up appointment in 6 months with blood draw 1 week prior.      Orders Placed This Encounter  Procedures   Urine Microalbumin w/creat. ratio     Meds ordered this encounter  Medications   metFORMIN (GLUCOPHAGE-XR) 500 MG 24 hr tablet    Sig: Take 2 tablets (1,000 mg total) by mouth daily with breakfast.    Dispense:  180 tablet    Refill:  3   pravastatin (PRAVACHOL) 10 MG tablet    Sig: Take 1 tablet (10 mg total) by mouth at bedtime.    Dispense:  90 tablet    Refill:  3     Follow up plan: Return in about 6 months (around 04/24/2024) for 6 month non fasting lab then 1 week later Follow-up DM, Anemia, CKD.  Future labs 04/24/24 A1c BMET CBC ANEMIA PANEL  Saralyn Pilar, DO [facility] 10/26/2023, 10:52 AM

## 2023-10-27 LAB — MICROALBUMIN / CREATININE URINE RATIO
Creatinine, Urine: 257 mg/dL (ref 20–320)
Microalb Creat Ratio: 3 mg/g{creat} (ref <30)
Microalb, Ur: 0.8 mg/dL

## 2023-11-05 ENCOUNTER — Ambulatory Visit: Payer: Medicare HMO

## 2023-11-05 ENCOUNTER — Ambulatory Visit: Payer: Medicare HMO | Attending: Cardiology

## 2023-11-05 DIAGNOSIS — I472 Ventricular tachycardia, unspecified: Secondary | ICD-10-CM

## 2023-11-05 DIAGNOSIS — I5022 Chronic systolic (congestive) heart failure: Secondary | ICD-10-CM | POA: Diagnosis not present

## 2023-11-05 DIAGNOSIS — I428 Other cardiomyopathies: Secondary | ICD-10-CM

## 2023-11-05 LAB — ECHOCARDIOGRAM COMPLETE
Area-P 1/2: 3.31 cm2
S' Lateral: 5.1 cm

## 2023-11-06 LAB — CUP PACEART REMOTE DEVICE CHECK
Battery Remaining Longevity: 87 mo
Battery Voltage: 3 V
Brady Statistic RV Percent Paced: 0.01 %
Date Time Interrogation Session: 20241111012305
HighPow Impedance: 59 Ohm
Implantable Lead Connection Status: 753985
Implantable Lead Implant Date: 20200514
Implantable Lead Location: 753860
Implantable Lead Model: 6935
Implantable Pulse Generator Implant Date: 20200514
Lead Channel Impedance Value: 342 Ohm
Lead Channel Impedance Value: 399 Ohm
Lead Channel Pacing Threshold Amplitude: 0.625 V
Lead Channel Pacing Threshold Pulse Width: 0.4 ms
Lead Channel Sensing Intrinsic Amplitude: 9.375 mV
Lead Channel Sensing Intrinsic Amplitude: 9.375 mV
Lead Channel Setting Pacing Amplitude: 2 V
Lead Channel Setting Pacing Pulse Width: 0.4 ms
Lead Channel Setting Sensing Sensitivity: 0.3 mV
Zone Setting Status: 755011
Zone Setting Status: 755011

## 2023-11-12 ENCOUNTER — Ambulatory Visit: Payer: Medicare HMO | Attending: Cardiology

## 2023-11-12 DIAGNOSIS — I5022 Chronic systolic (congestive) heart failure: Secondary | ICD-10-CM

## 2023-11-12 DIAGNOSIS — Z95810 Presence of automatic (implantable) cardiac defibrillator: Secondary | ICD-10-CM | POA: Diagnosis not present

## 2023-11-13 ENCOUNTER — Telehealth: Payer: Self-pay | Admitting: Cardiovascular Disease

## 2023-11-13 NOTE — Telephone Encounter (Signed)
Application has been completed and faxed to company

## 2023-11-13 NOTE — Telephone Encounter (Signed)
Patient dropped by to have PAF to be processed processed. Pam advised patient for processing information/further assistance.

## 2023-11-14 NOTE — Progress Notes (Signed)
EPIC Encounter for ICM Monitoring  Patient Name: Russell [name]. is a 72 y.o. male Date: 11/14/2023 Primary Care Physican: Smitty Cords, DO Primary Cardiologist: Mariah Milling Electrophysiologist: Lalla Brothers 07/06/2023 Weight: 159 lbs 10/12/2023 Weight: 158-159 lbs                                                          Spoke with patient and heart failure questions reviewed.  Transmission results reviewed.  Pt asymptomatic for fluid accumulation.  Reports feeling well at this time and voices no complaints.     Diet:  Follows a low salt diet by eating a lot of fruits and vegetables and limiting fluid intake to 64 oz daily.   He rarely eats restaurants foods.     Optivol thoracic impedance suggesting normal fluid levels with the exception of possible fluid accumulation from 10/14-11/9.   Prescribed: Spironolactone 25 mg take 1 tablet daily Furosemide 20 mg take 1 tablet (20 mg total) by mouth daily as needed   Labs: 10/19/2023 Creatinine 1.32, BUN 17, Potassium 4.3, Sodium 141, GFR 58  10/17/2023 Creatinine 1.32, BUN 15, Potassium 4.7, Sodium 140, GFR 58  04/26/2023 Creatinine 1.11, BUN 17, Potassium 4.9, Sodium 140, GFR >60 A complete set of results can be found in Results Review.   Recommendations:  No changes and encouraged to call if experiencing any fluid symptoms.   Follow-up plan: ICM clinic phone appointment on 12/17/2023.   91 day device clinic remote transmission 02/04/2024.    EP/Cardiology Next Office Visit:  Recall 05/08/2024 with Dr Mariah Milling.    Recall 04/14/2024 with Dr Lalla Brothers.     Copy of ICM check sent to Dr. Lalla Brothers.    3 month ICM trend: 11/12/2023.    12-14 Month ICM trend:     [name], RN 11/14/2023 7:45 AM

## 2023-11-25 ENCOUNTER — Other Ambulatory Visit: Payer: Self-pay | Admitting: Family Medicine

## 2023-11-25 DIAGNOSIS — E1136 Type 2 diabetes mellitus with diabetic cataract: Secondary | ICD-10-CM

## 2023-11-28 NOTE — Telephone Encounter (Signed)
Requested Prescriptions  Pending Prescriptions Disp Refills   ONETOUCH ULTRA TEST test strip [Pharmacy Med Name: OneTouch Ultra Test In Vitro Strip] 100 strip 3    Sig: TEST BLOOD SUGAR ONE TIME DAILY     There is no refill protocol information for this order     Lancets (ONETOUCH DELICA PLUS LANCET33G) MISC [Pharmacy Med Name: OneTouch Delica Plus Lancet33G Miscellaneous] 100 each 3    Sig: USE TO CHECK BLOOD SUGAR UP TO 1 TIME DAILY     Endocrinology: Diabetes - Testing Supplies Passed - 11/25/2023  5:17 AM      Passed - Valid encounter within last 12 months    Recent Outpatient Visits           1 month ago Annual physical exam   [place] [facility], Netta Neat, DO   2 months ago 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 [facility])   [place] [facility] Delles, Gentry Fitz A, RPH-CPP   6 months ago 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 [facility])   [place] [facility] Delles, Gentry Fitz A, RPH-CPP   7 months ago 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 [facility])   [place] [facility] Smitty Cords, DO   8 months ago Chronic systolic heart failure [facility])   [place] [facility] Delles, Jackelyn Poling, RPH-CPP       Future Appointments             In 2 months Richardo Hanks, Laurette Schimke, MD [facility] [place]   In 5 months Althea Charon, Netta Neat, DO  [facility]

## 2023-11-29 NOTE — Progress Notes (Signed)
Remote ICD transmission.   

## 2023-12-07 ENCOUNTER — Ambulatory Visit: Payer: Medicare HMO | Admitting: Pharmacist

## 2023-12-07 DIAGNOSIS — I10 Essential (primary) hypertension: Secondary | ICD-10-CM

## 2023-12-07 DIAGNOSIS — E1136 Type 2 diabetes mellitus with diabetic cataract: Secondary | ICD-10-CM

## 2023-12-07 NOTE — Progress Notes (Signed)
12/07/2023 Name: Russell [name]. MRN: [id number] DOB: 1950-12-30  Chief Complaint  Patient presents with   Medication Management    Russell [name]. is a 72 y.o. year old male who presented for a telephone visit.   They were referred to the pharmacist by their PCP for assistance in managing diabetes, hypertension, medication access, and complex medication management.    Subjective:   Care Team: Primary Care Provider: Smitty Cords, DO ; Next Scheduled Visit: 05/01/2024 Cardiologist: Antonieta Iba, MD Urology: Sondra Come, MD; Next Schedule Visit: 12/17/2023  Medication Access/Adherence  Current Pharmacy:  [facility] - [place], [place] - [address] [place] [address] Phone: [phone] Fax: [phone]  [facility] - [place], [place] - [address] [place] [address] Phone: [phone] Fax: [phone]   Patient reports affordability concerns with their medications: No  Patient reports access/transportation concerns to their pharmacy: No  Patient reports adherence concerns with their medications:  No     Diabetes:   Current medications:  - metformin ER 500 mg - 2 tablets daily             Reports tolerating well   Previous therapies tried: Jardiance 10 mg daily (hypotension; urinary symptoms); was unable to tolerate dose of metformin ER >1000 mg/day   Recent morning fasting reading recently ranging: 111-127; 1-2 hours after meals 147-158   Patient denies hypoglycemic s/sx including dizziness, shakiness, sweating.    Reports has been cutting back on sweets and carbohydrates   Current physical activity: Reports started back walking every other day   Statin: pravastatin 10 mg daily  Reports had his eye exam with Dr. Brooke Dare last month   Hypertension/Systolic CHF/Cardiomyopathy:   Patient followed by [facility]   Current medications:  - carvedilol 25 mg  twice daily - isosorbide ER 60 mg - 1.5 tablets (90 mg) twice daily - Entresto 97/103 twice daily - spironolactone 25 mg daily - doxazosin 1 mg twice daily  - furosemide 20 daily as needed for any ankle swelling or abdominal distention, weight gain   Previous therapies tried: Jardiance 10 mg daily (hypotension; urinary symptoms)   Patient has an automated, upper arm home BP cuff Recent blood pressure readings  12/12: 125/73, HR 56 12/10: 118/64, HR 65 12/9: 109/68, HR 68   Denies symptoms of hypotension such as dizziness or lightheadedness    Confirms weighs himself daily as recommended by Cardiologist - Denies s/sx of swelling at this time    Current medication access support:  - Entresto patient assistance from [employer] (paperwork completed by Cardiology)   Current physical activity: Reports started back walking every other day    Objective:  Lab Results  Component Value Date   HGBA1C 7.9 (H) 10/19/2023    Lab Results  Component Value Date   CREATININE 1.32 (H) 10/19/2023   BUN 17 10/19/2023   NA 141 10/19/2023   K 4.3 10/19/2023   CL 106 10/19/2023   CO2 28 10/19/2023    Lab Results  Component Value Date   CHOL 140 10/19/2023   HDL 56 10/19/2023   LDLCALC 68 10/19/2023   TRIG 82 10/19/2023   CHOLHDL 2.5 10/19/2023    Medications Reviewed Today     Reviewed by Manuela Neptune, RPH-CPP (Pharmacist) on 12/07/23 at [phone]  Med List Status: <None>   Medication Order Taking? Sig Documenting Provider Last Dose Status  Informant  Alcohol Swabs (DROPSAFE ALCOHOL PREP) 70 % PADS [id number]  Apply topically. [provider]  Active   amiodarone (PACERONE) 200 MG tablet [id number]  Take 0.5 tablets (100 mg total) by mouth daily. Sherie Don, NP  Active   aspirin 81 MG chewable tablet [id number]  Chew by mouth daily. [provider]  Active   BD SYRINGE SLIP TIP 25G X 5/8" 1 ML MISC [id number]   [provider]  Active   Blood Glucose  Monitoring Suppl (ONE TOUCH ULTRA 2) w/Device KIT [id number]  Use to check blood sugar x 1 daily Karamalegos, Netta Neat, DO  Active   brimonidine (ALPHAGAN) 0.2 % ophthalmic solution [id number]  Place 1 drop into both eyes 2 (two) times daily. [provider]  Active   carvedilol (COREG) 25 MG tablet [id number] Yes Take 1 tablet (25 mg total) by mouth 2 (two) times daily. Antonieta Iba, MD Taking Active   Cholecalciferol (VITAMIN D3) 2000 units TABS [id number]  Take 1 capsule by mouth daily.  [provider]  Active Spouse/Significant Other  dorzolamide-timolol (COSOPT) 22.3-6.8 MG/ML ophthalmic solution [id number]  Place 1 drop into both eyes 2 (two) times daily. [provider]  Active   doxazosin (CARDURA) 1 MG tablet [id number] Yes Take 1 tablet (1 mg total) by mouth 2 (two) times daily. Antonieta Iba, MD Taking Active   finasteride (PROSCAR) 5 MG tablet [id number]  Take 1 tablet (5 mg total) by mouth daily. Sondra Come, MD  Active   furosemide (LASIX) 20 MG tablet [id number] Yes Take 1 tablet (20 mg total) by mouth daily as needed. Antonieta Iba, MD Taking Active   isosorbide mononitrate (IMDUR) 60 MG 24 hr tablet [id number] Yes TAKE 1 AND 1/2 TABLETS TWICE DAILY Gollan, Tollie Pizza, MD Taking Active   Lancets [facility] DELICA PLUS [place]) MISC [id number]  USE TO CHECK BLOOD SUGAR UP TO 1 TIME DAILY Smitty Cords, DO  Active   latanoprost (XALATAN) 0.005 % ophthalmic solution [id number]  Place 1 drop into both eyes at bedtime.  [provider]  Active Spouse/Significant Other  metFORMIN (GLUCOPHAGE-XR) 500 MG 24 hr tablet [id number] Yes Take 2 tablets (1,000 mg total) by mouth daily with breakfast. Smitty Cords, DO Taking Active   nitroGLYCERIN (NITROSTAT) 0.4 MG SL tablet [id number]  DISSOLVE ONE TABLET UNDER THE TONGUE EVERY 5 MINUTES AS NEEDED FOR CHEST PAIN.  DO NOT EXCEED A TOTAL OF 3 DOSES IN 15 MINUTES  Patient not taking:  Reported on 10/26/2023   Antonieta Iba, MD  Active   [facility] ULTRA TEST test strip [id number]  TEST BLOOD SUGAR ONE TIME DAILY Smitty Cords, DO  Active   pravastatin (PRAVACHOL) 10 MG tablet [id number] Yes Take 1 tablet (10 mg total) by mouth at bedtime. Smitty Cords, DO Taking Active   sacubitril-valsartan (ENTRESTO) 97-103 MG [id number] Yes Take 1 tablet by mouth 2 (two) times daily. Antonieta Iba, MD Taking Active   spironolactone (ALDACTONE) 25 MG tablet [id number] Yes TAKE 1 TABLET EVERY DAY Gollan, Tollie Pizza, MD Taking Active   tamsulosin (FLOMAX) 0.4 MG CAPS capsule [id number]  Take 1 capsule (0.4 mg total) by mouth daily. Sondra Come, MD  Active               Assessment/Plan:   Diabetes: - Reviewed long term cardiovascular and renal outcomes of uncontrolled blood sugar - Reviewed goal A1c, goal fasting, and goal 2 hour post prandial glucose -  Reviewed dietary modifications including importance of having regular well-balanced meals throughout the day, while controlling carbohydrate portion sizes             Encourage patient to continue to review nutrition labels for total carbohydrate content of foods  Discuss ideas for balanced snack - Recommend to check glucose, keep log of results and have this record to review at upcoming medical appointments. Patient to contact provider office sooner if needed for readings outside of established parameters or symptoms             Encouraged patient to use glucose checks as feedback on dietary choices   Hypertension/Systolic CHF/Cardiomyopathy: - Recommended to continue to check home blood pressure and heart rate as well as daily weight, keep log of results and have record to review during future appointments - Encourage patient to continue to take positional changes slowly - Have counseled patient on importance of taking his blood pressure medications before coming to his medical appointments     Health  Maintenance - Discussed CDC/ACIP recommendations for Tetanus vaccine. Recommended to follow up with pharmacy to obtain Tetanus vaccination - Patient to ask his eye doctor to send documentation from his recent eye exam to PCP     Follow Up Plan: Clinical Pharmacist will follow up with patient by telephone on 03/07/2024 at 9:30 AM    Estelle Grumbles, PharmD, Patsy Baltimore, CPP Clinical Pharmacist [facility] [phone]

## 2023-12-07 NOTE — Patient Instructions (Signed)
Goals Addressed             This Visit's Progress    Pharmacy Goals       Our goal A1c is less than 7%. This corresponds with fasting sugars less than 130 and 2 hour after meal sugars less than 180. Please keep a log of your results when checking your blood sugar    Our blood pressure goal is less than 130/80.  To appropriately check your blood pressure, make sure you do the following:  1) Avoid caffeine, exercise, or tobacco products for 30 minutes before checking. Empty your bladder. 2) Sit with your back supported in a flat-backed chair. Rest your arm on something flat (arm of the chair, table, etc). 3) Sit still with your feet flat on the floor, resting, for at least 5 minutes.  4) Check your blood pressure. Take 1-2 readings.  5) Write down these readings and bring with you to any provider appointments.  Bring your home blood pressure machine with you to a provider's office for accuracy comparison at least once a year.   Make sure you take your blood pressure medications before you come to any office visit, even if you were asked to fast for labs.  Our goal bad cholesterol, or LDL, is less than 70 . This is why it is important to continue taking your pravastatin.   Thank you!   Wallace Cullens, PharmD, Para [place], CPP Clinical Pharmacist [facility] [place] [phone]

## 2023-12-17 ENCOUNTER — Ambulatory Visit: Payer: Medicare HMO | Attending: Cardiology

## 2023-12-17 DIAGNOSIS — Z95810 Presence of automatic (implantable) cardiac defibrillator: Secondary | ICD-10-CM | POA: Diagnosis not present

## 2023-12-17 DIAGNOSIS — I5022 Chronic systolic (congestive) heart failure: Secondary | ICD-10-CM

## 2023-12-20 ENCOUNTER — Telehealth: Payer: Self-pay

## 2023-12-20 NOTE — Telephone Encounter (Signed)
Remote ICM transmission received.  Attempted call to patient regarding ICM remote transmission and left detailed message per DPR.  Left ICM phone number and advised to return call for any fluid symptoms or questions. Next ICM remote transmission scheduled 01/21/2024.

## 2023-12-20 NOTE — Progress Notes (Signed)
EPIC Encounter for ICM Monitoring  Patient Name: Russell [name]. is a 72 y.o. male Date: 12/20/2023 Primary Care Physican: Smitty Cords, DO Primary Cardiologist: Mariah Milling Electrophysiologist: Lalla Brothers 07/06/2023 Weight: 159 lbs 10/12/2023 Weight: 158-159 lbs                                                          Attempted call to patient and unable to reach.  Left detailed message per DPR regarding transmission.  Transmission results reviewed.     Diet:  Follows a low salt diet by eating a lot of fruits and vegetables and limiting fluid intake to 64 oz daily.   He rarely eats restaurants foods.     Optivol thoracic impedance suggesting possible fluid accumulation starting 12/5 and returned to baseline 12/23.   Prescribed: Spironolactone 25 mg take 1 tablet daily Furosemide 20 mg take 1 tablet (20 mg total) by mouth daily as needed   Labs: 10/19/2023 Creatinine 1.32, BUN 17, Potassium 4.3, Sodium 141, GFR 58  10/17/2023 Creatinine 1.32, BUN 15, Potassium 4.7, Sodium 140, GFR 58  04/26/2023 Creatinine 1.11, BUN 17, Potassium 4.9, Sodium 140, GFR >60 A complete set of results can be found in Results Review.   Recommendations:  Left voice mail with ICM number and encouraged to call if experiencing any fluid symptoms.   Follow-up plan: ICM clinic phone appointment on 01/21/2024.   91 day device clinic remote transmission 02/04/2024.    EP/Cardiology Next Office Visit:  Recall 05/08/2024 with Dr Mariah Milling.    Recall 04/14/2024 with Dr Lalla Brothers.     Copy of ICM check sent to Dr. Lalla Brothers.    3 month ICM trend: 12/17/2023.    12-14 Month ICM trend:     [name], RN 12/20/2023 10:09 AM

## 2024-01-15 ENCOUNTER — Other Ambulatory Visit: Payer: Self-pay

## 2024-01-15 DIAGNOSIS — N401 Enlarged prostate with lower urinary tract symptoms: Secondary | ICD-10-CM

## 2024-01-15 MED ORDER — TAMSULOSIN HCL 0.4 MG PO CAPS
0.4000 mg | ORAL_CAPSULE | Freq: Every day | ORAL | 3 refills | Status: DC
Start: 2024-01-15 — End: 2024-01-29

## 2024-01-21 ENCOUNTER — Ambulatory Visit: Payer: Medicare HMO | Attending: Cardiology

## 2024-01-21 DIAGNOSIS — I5022 Chronic systolic (congestive) heart failure: Secondary | ICD-10-CM

## 2024-01-21 DIAGNOSIS — Z95810 Presence of automatic (implantable) cardiac defibrillator: Secondary | ICD-10-CM

## 2024-01-25 ENCOUNTER — Telehealth: Payer: Self-pay

## 2024-01-25 NOTE — Progress Notes (Signed)
EPIC Encounter for ICM Monitoring  Patient Name: Russell [name]. is a 73 y.o. male Date: 01/25/2024 Primary Care Physican: Smitty Cords, DO Primary Cardiologist: Mariah Milling Electrophysiologist: Lalla Brothers 07/06/2023 Weight: 159 lbs 10/12/2023 Weight: 158-159 lbs                                                          Attempted call to patient and unable to reach.  Left detailed message per DPR regarding transmission.  Transmission results reviewed.     Diet:  Follows a low salt diet by eating a lot of fruits and vegetables and limiting fluid intake to 64 oz daily.   He rarely eats restaurants foods.     Optivol thoracic impedance suggesting normal fluid levels since 12/17/23.   Prescribed: Spironolactone 25 mg take 1 tablet daily Furosemide 20 mg take 1 tablet (20 mg total) by mouth daily as needed   Labs: 10/19/2023 Creatinine 1.32, BUN 17, Potassium 4.3, Sodium 141, GFR 58  10/17/2023 Creatinine 1.32, BUN 15, Potassium 4.7, Sodium 140, GFR 58  04/26/2023 Creatinine 1.11, BUN 17, Potassium 4.9, Sodium 140, GFR >60 A complete set of results can be found in Results Review.   Recommendations:  Left voice mail with ICM number and encouraged to call if experiencing any fluid symptoms.   Follow-up plan: ICM clinic phone appointment on 02/25/2024.   91 day device clinic remote transmission 02/04/2024.    EP/Cardiology Next Office Visit:  Recall 05/08/2024 with Dr Mariah Milling.    Recall 04/14/2024 with Dr Lalla Brothers.     Copy of ICM check sent to Dr. Lalla Brothers.    3 month ICM trend: 01/21/2024.    12-14 Month ICM trend:     [name], RN 01/25/2024 10:29 AM

## 2024-01-25 NOTE — Telephone Encounter (Signed)
Remote ICM transmission received.  Attempted call to patient regarding ICM remote transmission and left detailed message per DPR.  Left ICM phone number and advised to return call for any fluid symptoms or questions. Next ICM remote transmission scheduled 02/25/2024.

## 2024-01-28 ENCOUNTER — Other Ambulatory Visit: Payer: Self-pay

## 2024-01-28 DIAGNOSIS — Z125 Encounter for screening for malignant neoplasm of prostate: Secondary | ICD-10-CM

## 2024-01-28 DIAGNOSIS — N401 Enlarged prostate with lower urinary tract symptoms: Secondary | ICD-10-CM

## 2024-01-29 ENCOUNTER — Ambulatory Visit: Payer: Medicare HMO | Admitting: Urology

## 2024-01-29 ENCOUNTER — Other Ambulatory Visit
Admission: RE | Admit: 2024-01-29 | Discharge: 2024-01-29 | Disposition: A | Discharge status: Home / Self Care | Payer: Medicare HMO | Attending: Urology | Admitting: Urology

## 2024-01-29 ENCOUNTER — Other Ambulatory Visit: Payer: Self-pay | Admitting: Cardiology

## 2024-01-29 VITALS — BP 123/75 | HR 59 | Ht 66.5 in | Wt 156.0 lb

## 2024-01-29 DIAGNOSIS — Z125 Encounter for screening for malignant neoplasm of prostate: Secondary | ICD-10-CM | POA: Diagnosis not present

## 2024-01-29 DIAGNOSIS — N401 Enlarged prostate with lower urinary tract symptoms: Secondary | ICD-10-CM

## 2024-01-29 DIAGNOSIS — R3911 Hesitancy of micturition: Secondary | ICD-10-CM

## 2024-01-29 DIAGNOSIS — N529 Male erectile dysfunction, unspecified: Secondary | ICD-10-CM | POA: Diagnosis not present

## 2024-01-29 LAB — BLADDER SCAN AMB NON-IMAGING

## 2024-01-29 MED ORDER — TAMSULOSIN HCL 0.4 MG PO CAPS: 0.4000 mg | ORAL_CAPSULE | Freq: Every day | ORAL | 3 refills | Status: DC

## 2024-01-29 MED ORDER — FINASTERIDE 5 MG PO TABS: 5.0000 mg | ORAL_TABLET | Freq: Every day | ORAL | 3 refills | Status: DC

## 2024-01-29 NOTE — Progress Notes (Signed)
   01/29/2024 1:06 PM   Russell [name]. 1951-10-15 [id number]  Reason for visit: Follow up BPH/LUTS, PSA screening, ED  HPI: 73 year old male who was previously followed by Dr. Kassie and transferred his care to us  in January 2024.  He has a long history of BPH with weak urinary stream that has been managed on maximal medical therapy with Flomax  and finasteride .  He has a history of a mildly elevated PSA of 4.7 and underwent a prostate MRI in May 2023 showing no abnormal lesions, prostate measured 54 g equating to a reassuring PSA density, and PSA decreased appropriately on finasteride  since that time.  He also has ED that has been refractory to oral medications and penile injections, and not interested in further ED treatments at this time.  He denies any changes over the last year.  No specific urinary complaints today, PVR is normal at 18ml.  Continues on Flomax  and finasteride .  Persistent ED, we again discussed options including repeat trial of penile injections or referral to consider penile prosthesis.  He defers further intervention at this time.  We discussed the risks and benefits of PSA screening, I think it is reasonable to repeat a PSA today.  If PSA in the normal range can likely discontinue screening per the guideline recommendations.  Flomax  and finasteride  refilled PSA today, call with results RTC 1 year PVR  Russell JAYSON Burnet, MD  [facility] [address] [place], [place] [address] [phone]

## 2024-01-30 LAB — PSA, TOTAL AND FREE
PSA, Free Pct: 15 %
PSA, Free: 0.27 ng/mL
Prostate Specific Ag, Serum: 1.8 ng/mL (ref 0.0–4.0)

## 2024-02-04 ENCOUNTER — Ambulatory Visit (INDEPENDENT_AMBULATORY_CARE_PROVIDER_SITE_OTHER): Payer: Medicare HMO

## 2024-02-04 DIAGNOSIS — I428 Other cardiomyopathies: Secondary | ICD-10-CM | POA: Diagnosis not present

## 2024-02-04 DIAGNOSIS — I472 Ventricular tachycardia, unspecified: Secondary | ICD-10-CM

## 2024-02-07 ENCOUNTER — Encounter: Payer: Self-pay | Admitting: Cardiology

## 2024-02-07 LAB — CUP PACEART REMOTE DEVICE CHECK
Battery Remaining Longevity: 89 mo
Battery Voltage: 3 V
Brady Statistic RV Percent Paced: 0.03 %
Date Time Interrogation Session: 20250210012403
HighPow Impedance: 53 Ohm
Implantable Lead Connection Status: 753985
Implantable Lead Implant Date: 20200514
Implantable Lead Location: 753860
Implantable Lead Model: 6935
Implantable Pulse Generator Implant Date: 20200514
Lead Channel Impedance Value: 323 Ohm
Lead Channel Impedance Value: 380 Ohm
Lead Channel Pacing Threshold Amplitude: 0.75 V
Lead Channel Pacing Threshold Pulse Width: 0.4 ms
Lead Channel Sensing Intrinsic Amplitude: 9.375 mV
Lead Channel Sensing Intrinsic Amplitude: 9.375 mV
Lead Channel Setting Pacing Amplitude: 2 V
Lead Channel Setting Pacing Pulse Width: 0.4 ms
Lead Channel Setting Sensing Sensitivity: 0.3 mV
Zone Setting Status: 755011
Zone Setting Status: 755011

## 2024-02-25 ENCOUNTER — Ambulatory Visit: Payer: Medicare HMO | Attending: Cardiology

## 2024-02-25 DIAGNOSIS — Z95810 Presence of automatic (implantable) cardiac defibrillator: Secondary | ICD-10-CM | POA: Diagnosis not present

## 2024-02-25 DIAGNOSIS — I5022 Chronic systolic (congestive) heart failure: Secondary | ICD-10-CM | POA: Diagnosis not present

## 2024-02-27 LAB — HM DIABETES EYE EXAM

## 2024-02-29 ENCOUNTER — Telehealth: Payer: Self-pay

## 2024-02-29 NOTE — Progress Notes (Signed)
 EPIC Encounter for ICM Monitoring  Patient Name: Russell [name]. is a 73 y.o. male Date: 02/29/2024 Primary Care Physican: Smitty Cords, DO Primary Cardiologist: Mariah Milling Electrophysiologist: Lalla Brothers 07/06/2023 Weight: 159 lbs 10/12/2023 Weight: 158-159 lbs                                                          Attempted call to patient and unable to reach.  Left detailed message per DPR regarding transmission.  Transmission results reviewed.     Diet:  Follows a low salt diet by eating a lot of fruits and vegetables and limiting fluid intake to 64 oz daily.   He rarely eats restaurants foods.     Optivol thoracic impedance suggesting intermittent days with possible fluid accumulation within the last month.   Prescribed: Spironolactone 25 mg take 1 tablet daily Furosemide 20 mg take 1 tablet (20 mg total) by mouth daily as needed   Labs: 10/19/2023 Creatinine 1.32, BUN 17, Potassium 4.3, Sodium 141, GFR 58  10/17/2023 Creatinine 1.32, BUN 15, Potassium 4.7, Sodium 140, GFR 58  04/26/2023 Creatinine 1.11, BUN 17, Potassium 4.9, Sodium 140, GFR >60 A complete set of results can be found in Results Review.   Recommendations:  Left voice mail with ICM number and encouraged to call if experiencing any fluid symptoms.   Follow-up plan: ICM clinic phone appointment on 03/31/2024.   91 day device clinic remote transmission 05/05/2024.    EP/Cardiology Next Office Visit:  Recall 05/08/2024 with Dr Mariah Milling.    Recall 04/14/2024 with Dr Lalla Brothers.     Copy of ICM check sent to Dr. Lalla Brothers.     3 month ICM trend: 02/25/2024.    12-14 Month ICM trend:     [name], RN 02/29/2024 12:06 PM

## 2024-02-29 NOTE — Telephone Encounter (Signed)
Remote ICM transmission received.  Attempted call to patient regarding ICM remote transmission and no answer, voice mail not set up ? ?

## 2024-03-07 ENCOUNTER — Other Ambulatory Visit: Payer: Self-pay

## 2024-03-10 NOTE — Addendum Note (Signed)
 Addended by: Geralyn Flash D on: 03/10/2024 03:48 PM   Modules accepted: Orders

## 2024-03-10 NOTE — Progress Notes (Signed)
 Remote ICD transmission.

## 2024-03-28 ENCOUNTER — Other Ambulatory Visit: Payer: Self-pay | Admitting: Cardiovascular Disease

## 2024-03-31 ENCOUNTER — Ambulatory Visit: Attending: Cardiology

## 2024-03-31 DIAGNOSIS — Z95810 Presence of automatic (implantable) cardiac defibrillator: Secondary | ICD-10-CM

## 2024-03-31 DIAGNOSIS — I5022 Chronic systolic (congestive) heart failure: Secondary | ICD-10-CM | POA: Diagnosis not present

## 2024-04-03 NOTE — Progress Notes (Signed)
 EPIC Encounter for ICM Monitoring  Patient Name: Russell [name]. is a 73 y.o. male Date: 04/03/2024 Primary Care Physican: Smitty Cords, DO Primary Cardiologist: Mariah Milling Electrophysiologist: Lalla Brothers 07/06/2023 Weight: 159 lbs 10/12/2023 Weight: 158-159 lbs 04/03/2024 Weight: 158-159 lbs                                                          Spoke with patient and heart failure questions reviewed.  Transmission results reviewed.  Pt asymptomatic for fluid accumulation.  Reports feeling well at this time and voices no complaints.      Diet:  Follows a low salt diet by eating a lot of fruits and vegetables and limiting fluid intake to 64 oz daily.   He rarely eats restaurants foods.     Optivol thoracic impedance suggesting normal fluid levels within the last month.   Prescribed: Spironolactone 25 mg take 1 tablet daily Furosemide 20 mg take 1 tablet (20 mg total) by mouth daily as needed   Labs: 10/19/2023 Creatinine 1.32, BUN 17, Potassium 4.3, Sodium 141, GFR 58  10/17/2023 Creatinine 1.32, BUN 15, Potassium 4.7, Sodium 140, GFR 58  04/26/2023 Creatinine 1.11, BUN 17, Potassium 4.9, Sodium 140, GFR >60 A complete set of results can be found in Results Review.   Recommendations:  No changes and encouraged to call if experiencing any fluid symptoms.   Follow-up plan: ICM clinic phone appointment on 05/06/2024.   91 day device clinic remote transmission 05/05/2024.    EP/Cardiology Next Office Visit:  05/16/2024 with Dr Mariah Milling.    04/23/2024 with Dr Lalla Brothers.     Copy of ICM check sent to Dr. Lalla Brothers.      3 month ICM trend: 03/31/2024.    12-14 Month ICM trend:     [name], RN 04/03/2024 12:48 PM

## 2024-04-22 NOTE — Progress Notes (Unsigned)
  Electrophysiology Office Follow up Visit Note:    Date:  04/23/2024   ID:  Russell Hum., DOB [birth year], MRN [id number]  PCP:  Raina Bunting, DO  [facility] Cardiologist:  Belva Boyden, MD  [facility] Electrophysiologist:  Boyce Byes, MD    Interval History:     Russell [name]. is a 73 y.o. male who presents for a follow up visit.   Russell [name] last saw Russell [name] in clinic October 17, 2023.  He has a history of chronic systolic heart failure, ventricular tachycardia, ICD in situ, diabetes, hypertension, cardiac arrest.  His ICD was implanted in 2020.  When he last saw Russell [name] he was electrically quiescent on amiodarone .  He has been doing well since I last saw him.  No syncopal episodes.  No problems with his defibrillator site.      Past medical, surgical, social and family history were reviewed.  ROS:   Please see the history of present illness.    All other systems reviewed and are negative.  EKGs/Labs/Other Studies Reviewed:    The following studies were reviewed today:  April 23, 2024 in-clinic device interrogation personally reviewed Battery longevity 7.1 years Lead parameter stable Several nonsustained ventricular tachycardia episodes all occurring on March 22.  These were asymptomatic.        Physical Exam:    VS:  BP 132/74   Pulse 60   Ht 5\' 8"  (1.727 m)   Wt 157 lb (71.2 kg)   SpO2 98%   BMI 23.87 kg/m     Wt Readings from Last 3 Encounters:  04/23/24 157 lb (71.2 kg)  01/29/24 156 lb (70.8 kg)  10/26/23 156 lb (70.8 kg)     GEN: no distress CARD: RRR, No MRG.  CIED pocket well-healed RESP: No IWOB. CTAB.      ASSESSMENT:    1. VT (ventricular tachycardia) (HCC)   2. Chronic systolic heart failure (HCC)   3. ICD (implantable cardioverter-defibrillator) in place   4. Nonischemic cardiomyopathy (HCC)   5. Encounter for long-term (current) use of high-risk medication    PLAN:    In order of problems listed  above:  #Chronic systolic heart failure #Ventricular tachycardia #ICD in situ #High risk med monitoring-amiodarone  NYHA class II.  Warm and dry on exam.  Continue Coreg , Lasix , Imdur , Entresto , spironolactone .  ICD functioning appropriately.  Continue remote monitoring.  Update CMP, TSH and free T4.  He will get these added to his primary care blood work scheduled for tomorrow  Follow-up 6 months with APP   Signed, Harvie Liner, MD, [facility], [facility] 04/23/2024 11:19 AM    Electrophysiology [facility]

## 2024-04-23 ENCOUNTER — Ambulatory Visit: Attending: Cardiology | Admitting: Cardiology

## 2024-04-23 ENCOUNTER — Other Ambulatory Visit: Payer: Self-pay | Admitting: Family Medicine

## 2024-04-23 ENCOUNTER — Encounter: Payer: Self-pay | Admitting: Cardiology

## 2024-04-23 ENCOUNTER — Other Ambulatory Visit: Payer: Self-pay

## 2024-04-23 VITALS — BP 132/74 | HR 60 | Ht 68.0 in | Wt 157.0 lb

## 2024-04-23 DIAGNOSIS — I428 Other cardiomyopathies: Secondary | ICD-10-CM

## 2024-04-23 DIAGNOSIS — I5022 Chronic systolic (congestive) heart failure: Secondary | ICD-10-CM | POA: Diagnosis not present

## 2024-04-23 DIAGNOSIS — Z95810 Presence of automatic (implantable) cardiac defibrillator: Secondary | ICD-10-CM | POA: Diagnosis not present

## 2024-04-23 DIAGNOSIS — I472 Ventricular tachycardia, unspecified: Secondary | ICD-10-CM | POA: Diagnosis not present

## 2024-04-23 DIAGNOSIS — R7989 Other specified abnormal findings of blood chemistry: Secondary | ICD-10-CM

## 2024-04-23 DIAGNOSIS — Z79899 Other long term (current) drug therapy: Secondary | ICD-10-CM | POA: Diagnosis not present

## 2024-04-23 DIAGNOSIS — I10 Essential (primary) hypertension: Secondary | ICD-10-CM

## 2024-04-23 DIAGNOSIS — E1169 Type 2 diabetes mellitus with other specified complication: Secondary | ICD-10-CM

## 2024-04-23 DIAGNOSIS — E1136 Type 2 diabetes mellitus with diabetic cataract: Secondary | ICD-10-CM

## 2024-04-23 DIAGNOSIS — D649 Anemia, unspecified: Secondary | ICD-10-CM

## 2024-04-23 LAB — PACEMAKER DEVICE OBSERVATION

## 2024-04-23 NOTE — Patient Instructions (Signed)
 Medication Instructions:  Your physician recommends that you continue on your current medications as directed. Please refer to the Current Medication list given to you today.  *If you need a refill on your cardiac medications before your next appointment, please call your pharmacy*  Lab Work: CMET, TSH, T4 If you have labs (blood work) drawn today and your tests are completely normal, you will receive your results only by: MyChart Message (if you have MyChart) OR A paper copy in the mail If you have any lab test that is abnormal or we need to change your treatment, we will call you to review the results.  Follow-Up: At [facility] you and your health needs are our priority.  As part of our continuing mission to provide you with exceptional heart care, our providers are all part of one team.  This team includes your primary Cardiologist (physician) and Advanced Practice Providers or APPs (Physician Assistants and Nurse Practitioners) who all work together to provide you with the care you need, when you need it.  Your next appointment:   6 months  Provider:   Suzann Riddle, NP

## 2024-04-24 ENCOUNTER — Other Ambulatory Visit: Payer: Self-pay

## 2024-04-24 DIAGNOSIS — Z95810 Presence of automatic (implantable) cardiac defibrillator: Secondary | ICD-10-CM

## 2024-04-24 DIAGNOSIS — I428 Other cardiomyopathies: Secondary | ICD-10-CM

## 2024-04-24 DIAGNOSIS — I10 Essential (primary) hypertension: Secondary | ICD-10-CM | POA: Diagnosis not present

## 2024-04-24 DIAGNOSIS — I5022 Chronic systolic (congestive) heart failure: Secondary | ICD-10-CM

## 2024-04-24 DIAGNOSIS — D649 Anemia, unspecified: Secondary | ICD-10-CM

## 2024-04-24 DIAGNOSIS — E1136 Type 2 diabetes mellitus with diabetic cataract: Secondary | ICD-10-CM | POA: Diagnosis not present

## 2024-04-24 DIAGNOSIS — R7989 Other specified abnormal findings of blood chemistry: Secondary | ICD-10-CM | POA: Diagnosis not present

## 2024-04-25 LAB — COMPREHENSIVE METABOLIC PANEL WITH GFR
AG Ratio: 1.6 (calc) (ref 1.0–2.5)
ALT: 8 U/L — ABNORMAL LOW (ref 9–46)
AST: 8 U/L — ABNORMAL LOW (ref 10–35)
Albumin: 3.9 g/dL (ref 3.6–5.1)
Alkaline phosphatase (APISO): 37 U/L (ref 35–144)
BUN/Creatinine Ratio: 12 (calc) (ref 6–22)
BUN: 17 mg/dL (ref 7–25)
CO2: 26 mmol/L (ref 20–32)
Calcium: 9.1 mg/dL (ref 8.6–10.3)
Chloride: 109 mmol/L (ref 98–110)
Creat: 1.42 mg/dL — ABNORMAL HIGH (ref 0.70–1.28)
Globulin: 2.5 g/dL (ref 1.9–3.7)
Glucose, Bld: 160 mg/dL — ABNORMAL HIGH (ref 65–99)
Potassium: 4.1 mmol/L (ref 3.5–5.3)
Sodium: 142 mmol/L (ref 135–146)
Total Bilirubin: 0.4 mg/dL (ref 0.2–1.2)
Total Protein: 6.4 g/dL (ref 6.1–8.1)
eGFR: 53 mL/min/{1.73_m2} — ABNORMAL LOW (ref >=60)

## 2024-04-25 LAB — CBC WITH DIFFERENTIAL/PLATELET
Absolute Lymphocytes: 1440 {cells}/uL (ref 850–3900)
Absolute Monocytes: 580 {cells}/uL (ref 200–950)
Basophils Absolute: 31 {cells}/uL (ref 0–200)
Basophils Relative: 0.5 %
Eosinophils Absolute: 140 {cells}/uL (ref 15–500)
Eosinophils Relative: 2.3 %
HCT: 35.3 % — ABNORMAL LOW (ref 38.5–50.0)
Hemoglobin: 11.6 g/dL — ABNORMAL LOW (ref 13.2–17.1)
MCH: 31.2 pg (ref 27.0–33.0)
MCHC: 32.9 g/dL (ref 32.0–36.0)
MCV: 94.9 fL (ref 80.0–100.0)
MPV: 11.1 fL (ref 7.5–12.5)
Monocytes Relative: 9.5 %
Neutro Abs: 3910 {cells}/uL (ref 1500–7800)
Neutrophils Relative %: 64.1 %
Platelets: 143 10*3/uL (ref 140–400)
RBC: 3.72 10*6/uL — ABNORMAL LOW (ref 4.20–5.80)
RDW: 14 % (ref 11.0–15.0)
Total Lymphocyte: 23.6 %
WBC: 6.1 10*3/uL (ref 3.8–10.8)

## 2024-04-25 LAB — IRON,TIBC AND FERRITIN PANEL
%SAT: 25 % (ref 20–48)
Ferritin: 109 ng/mL (ref 24–380)
Iron: 77 ug/dL (ref 50–180)
TIBC: 302 ug/dL (ref 250–425)

## 2024-04-25 LAB — HEMOGLOBIN A1C
Hgb A1c MFr Bld: 8.4 % — ABNORMAL HIGH (ref <5.7)
Mean Plasma Glucose: 194 mg/dL
eAG (mmol/L): 10.8 mmol/L

## 2024-04-25 LAB — T4, FREE: Free T4: 1.6 ng/dL (ref 0.8–1.8)

## 2024-04-25 LAB — TSH: TSH: 1.04 m[IU]/L (ref 0.40–4.50)

## 2024-05-01 ENCOUNTER — Ambulatory Visit: Payer: Self-pay | Admitting: Family Medicine

## 2024-05-02 LAB — CUP PACEART INCLINIC DEVICE CHECK
Date Time Interrogation Session: 20250430080823
Implantable Lead Connection Status: 753985
Implantable Lead Implant Date: 20200514
Implantable Lead Location: 753860
Implantable Lead Model: 6935
Implantable Pulse Generator Implant Date: 20200514

## 2024-05-04 ENCOUNTER — Encounter: Payer: Self-pay | Admitting: Cardiology

## 2024-05-05 ENCOUNTER — Ambulatory Visit (INDEPENDENT_AMBULATORY_CARE_PROVIDER_SITE_OTHER): Payer: Medicare HMO

## 2024-05-05 DIAGNOSIS — I428 Other cardiomyopathies: Secondary | ICD-10-CM | POA: Diagnosis not present

## 2024-05-05 DIAGNOSIS — I5022 Chronic systolic (congestive) heart failure: Secondary | ICD-10-CM

## 2024-05-06 ENCOUNTER — Encounter: Payer: Self-pay | Admitting: Family Medicine

## 2024-05-06 ENCOUNTER — Ambulatory Visit: Attending: Cardiology

## 2024-05-06 ENCOUNTER — Ambulatory Visit (INDEPENDENT_AMBULATORY_CARE_PROVIDER_SITE_OTHER): Admitting: Family Medicine

## 2024-05-06 ENCOUNTER — Encounter: Payer: Self-pay | Admitting: Cardiology

## 2024-05-06 VITALS — BP 130/70 | HR 68 | Ht 68.0 in | Wt 157.0 lb

## 2024-05-06 DIAGNOSIS — I10 Essential (primary) hypertension: Secondary | ICD-10-CM | POA: Diagnosis not present

## 2024-05-06 DIAGNOSIS — Z95810 Presence of automatic (implantable) cardiac defibrillator: Secondary | ICD-10-CM

## 2024-05-06 DIAGNOSIS — Z7984 Long term (current) use of oral hypoglycemic drugs: Secondary | ICD-10-CM

## 2024-05-06 DIAGNOSIS — E1169 Type 2 diabetes mellitus with other specified complication: Secondary | ICD-10-CM | POA: Diagnosis not present

## 2024-05-06 DIAGNOSIS — D649 Anemia, unspecified: Secondary | ICD-10-CM | POA: Diagnosis not present

## 2024-05-06 DIAGNOSIS — L821 Other seborrheic keratosis: Secondary | ICD-10-CM

## 2024-05-06 DIAGNOSIS — E785 Hyperlipidemia, unspecified: Secondary | ICD-10-CM

## 2024-05-06 DIAGNOSIS — I5022 Chronic systolic (congestive) heart failure: Secondary | ICD-10-CM

## 2024-05-06 DIAGNOSIS — E1136 Type 2 diabetes mellitus with diabetic cataract: Secondary | ICD-10-CM | POA: Diagnosis not present

## 2024-05-06 LAB — CUP PACEART REMOTE DEVICE CHECK
Battery Remaining Longevity: 83 mo
Battery Voltage: 3 V
Brady Statistic RV Percent Paced: 0.07 %
Date Time Interrogation Session: 20250512043823
HighPow Impedance: 55 Ohm
Implantable Lead Connection Status: 753985
Implantable Lead Implant Date: 20200514
Implantable Lead Location: 753860
Implantable Lead Model: 6935
Implantable Pulse Generator Implant Date: 20200514
Lead Channel Impedance Value: 323 Ohm
Lead Channel Impedance Value: 342 Ohm
Lead Channel Pacing Threshold Amplitude: 0.625 V
Lead Channel Pacing Threshold Pulse Width: 0.4 ms
Lead Channel Sensing Intrinsic Amplitude: 8.375 mV
Lead Channel Sensing Intrinsic Amplitude: 8.375 mV
Lead Channel Setting Pacing Amplitude: 2 V
Lead Channel Setting Pacing Pulse Width: 0.4 ms
Lead Channel Setting Sensing Sensitivity: 0.3 mV
Zone Setting Status: 755011
Zone Setting Status: 755011

## 2024-05-06 MED ORDER — RYBELSUS 3 MG PO TABS: 3.0000 mg | ORAL_TABLET | Freq: Every day | ORAL | Status: DC

## 2024-05-06 NOTE — Patient Instructions (Addendum)
 Thank you for coming to the office today.  Contact me within 3-4 weeks for dose increase Rybelsus  Starting dose is 3mg  once daily, first thing in morning on empty stomach, sip of water , no other medicines. Wait 30 min before first meal of day. ---------------------  Labs look good overall  Kidney function slightly lower but not a concern  I will send results to Cardiologist Dr Marven Slimmer for the thyroid  and chemistry   Please schedule a Follow-up Appointment to: Return in about 3 months (around 08/06/2024) for 3 month DM A1c 840 apt preferred.  If you have any other questions or concerns, please feel free to call the office or send a message through MyChart. You may also schedule an earlier appointment if necessary.  Additionally, you may be receiving a survey about your experience at our office within a few days to 1 week by e-mail or mail. We value your feedback.  Domingo Friend, DO [facility], [place]

## 2024-05-06 NOTE — Progress Notes (Signed)
 Subjective:    Patient ID: Russell Hum., male    DOB: 13-Nov-1951, 73 y.o.   MRN: [id number]  Russell Mcquown. is a 73 y.o. male presenting on 05/06/2024 for Diabetes   HPI  Discussed the use of AI scribe software for clinical note transcription with the patient, who gave verbal consent to proceed.  History of Present Illness   Russell Tumulty. is a 74 year old male with type 2 diabetes who presents for a six-month follow-up visit.   Thyroid  function tests returned normal results. He is mildly anemic with a hemoglobin level of 11.6, but he is not iron deficient. No significant symptoms related to anemia.  He has raised, itchy skin spots on his face and body, described as 'little dots' that are scaly and sometimes itchy. He does not currently use any creams for these spots. There is a family history of similar skin conditions in older family members.       CHRONIC DM, Type 2 with cataracts / Atherosclerotic vascular disease Last lab A1c 8.4 elevated from prior 7.9 Prefers to avoid injections Recent lab work from May 1st showed an increase in his A1c levels, fluctuating between the high sevens and low eights over the past two years. He does not consume much sugar but acknowledges eating starches like [place] fries. He is currently on metformin  and was previously on Jardiance , which he has since discontinued. No major side effects from his current medication regimen.  Meds: Metformin  500mg  x2 XR daily (x 3 per day had side effect) - He has STOPPED Jardiance  10mg  daily, BP was too low and urinary symptoms. Reports good compliance. Tolerating well w/o side-effects Currently on ACEi He is tolerating it well. On Pravastatin  10mg  nightly, no side effects or myalgia Improved LDL on statin Has OneTouch Ultra 2  Lifestyle:  - Diet (Improving diet, less sweets, less sugar, no sodas) - Exercise (walking Followed by [facility] Russell Alene Husk see below, for glaucoma and cataracts Denies hypoglycemia,  polyuria, visual changes, numbness or tingling.   Elevated Cr / CKD His kidney function is being monitored, with recent labs showing a creatinine level of 1.42 and a GFR of 53, a slight decline from previous values. He does not see a nephrologist.   CHRONIC HTN / Systolic CHF / Cardiomyopathy Followed by Cardiology Current Meds - Carvedilol  25mg  BID, Spironolcatone 25mg  Entrestro 97-103 BID, Isosorbide  mononitrate 60  On Amiodarone  Reports good compliance, took meds today. Tolerating well, w/o complaints. Lifestyle: Denies CP, dyspnea, HA, edema, dizziness / lightheadedness   BPH LUTS Followed by Russell Russell [name] [facility] [place] He continues on Alpha Blocker and Finasteride , he has had good results with improved urinary symptoms Prostate MRI has been normal. They are monitoring PSA   Anemia, Normocytic Hgb 11.8 mild reduced He denies any significant fatigue or changes in energy levels.       05/06/2024    9:25 AM 10/26/2023   10:38 AM 07/13/2023   11:02 AM  Depression screen PHQ 2/9  Decreased Interest 0 0 0  Down, Depressed, Hopeless 0 0 0  PHQ - 2 Score 0 0 0  Altered sleeping 0 0 0  Tired, decreased energy 0 1 0  Change in appetite 0 0 0  Feeling bad or failure about yourself  0 0 0  Trouble concentrating 0 0 0  Moving slowly or fidgety/restless 0 0 0  Suicidal thoughts 0 0 0  PHQ-9 Score 0 1 0  Difficult doing work/chores  Not difficult at all Not difficult at all Not difficult at all       05/06/2024    9:25 AM 10/26/2023   10:38 AM 04/19/2023   10:01 AM 10/18/2022    9:57 AM  GAD 7 : Generalized Anxiety Score  Nervous, Anxious, on Edge 0 0 0 0  Control/stop worrying 0 0 0 0  Worry too much - different things 0 0 1 0  Trouble relaxing 0 0 0 0  Restless 0 0 0 0  Easily annoyed or irritable 0 0 0 0  Afraid - awful might happen 0 0 0 0  Total GAD 7 Score 0 0 1 0  Anxiety Difficulty Not difficult at all   Not difficult at all    Social History   Tobacco  Use   Smoking status: Never   Smokeless tobacco: Never  Vaping Use   Vaping status: Never Used  Substance Use Topics   Alcohol  use: Yes    Comment: occassional   Drug use: No    Review of Systems Per HPI unless specifically indicated above     Objective:     BP 130/70 (BP Location: Left Arm, Patient Position: Sitting, Cuff Size: Normal)   Pulse 68   Ht 5\' 8"  (1.727 m)   Wt 157 lb (71.2 kg)   SpO2 97%   BMI 23.87 kg/m   Wt Readings from Last 3 Encounters:  05/06/24 157 lb (71.2 kg)  04/23/24 157 lb (71.2 kg)  01/29/24 156 lb (70.8 kg)    Physical Exam Vitals and nursing note reviewed.  Constitutional:      General: He is not in acute distress.    Appearance: Normal appearance. He is well-developed. He is not diaphoretic.     Comments: Well-appearing, comfortable, cooperative  HENT:     Head: Normocephalic and atraumatic.  Eyes:     General:        Right eye: No discharge.        Left eye: No discharge.     Conjunctiva/sclera: Conjunctivae normal.  Cardiovascular:     Rate and Rhythm: Normal rate.  Pulmonary:     Effort: Pulmonary effort is normal.  Skin:    General: Skin is warm and dry.     Findings: No erythema or rash.  Neurological:     Mental Status: He is alert and oriented to person, place, and time.  Psychiatric:        Mood and Affect: Mood normal.        Behavior: Behavior normal.        Thought Content: Thought content normal.     Comments: Well groomed, good eye contact, normal speech and thoughts     Results for orders placed or performed in visit on 05/05/24  CUP PACEART REMOTE DEVICE CHECK   Collection Time: 05/05/24  4:38 AM  Result Value Ref Range   Date Time Interrogation Session [id number]    Pulse Generator Manufacturer MERM    Pulse Gen Model [id number] Visia AF MRI VR    Pulse Gen Serial Number [id number]    Clinic Name [facility]    Implantable Pulse Generator Type Implantable Cardiac Defibulator    Implantable Pulse  Generator Implant Date [id number]    Implantable Lead Manufacturer [facility]    Implantable Lead Model 6935 Sprint Quattro Secure S MRI SureScan    Implantable Lead Serial Number [id number]    Implantable Lead Implant Date [id number]    Implantable Lead Location Detail 1  APEX    Implantable Lead Location [id number]    Implantable Lead Connection Status [id number]    Lead Channel Setting Sensing Sensitivity 0.3 mV   Lead Channel Setting Pacing Pulse Width 0.4 ms   Lead Channel Setting Pacing Amplitude 2 V   Zone Setting Status Active    Zone Setting Status Inactive    Zone Setting Status Active    Zone Setting Status 755011    Zone Setting Status [id number]    Lead Channel Impedance Value 342 ohm   Lead Channel Impedance Value 323 ohm   Lead Channel Sensing Intrinsic Amplitude 8.375 mV   Lead Channel Sensing Intrinsic Amplitude 8.375 mV   Lead Channel Pacing Threshold Amplitude 0.625 V   Lead Channel Pacing Threshold Pulse Width 0.4 ms   HighPow Impedance 55 ohm   Battery Status OK    Battery Remaining Longevity 83 mo   Battery Voltage 3.00 V   Brady Statistic RV Percent Paced 0.07 %      Assessment & Plan:   Problem List Items Addressed This Visit     Chronic systolic heart failure (HCC)   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 (HCC) - Primary   Relevant Medications   RYBELSUS 3 MG TABS   Essential hypertension   Hyperlipidemia associated with type 2 diabetes mellitus (HCC)   Relevant Medications   RYBELSUS 3 MG TABS   Other Visit Diagnoses       Normocytic anemia         Seborrheic keratoses         Long term current use of oral hypoglycemic drug            Type 2 diabetes mellitus with elevated A1c A1c increased to high 7s to low 8s. Managed with metformin . Discussed Rybelsus as alternative to injectables for blood sugar control and cardiovascular protection. Potential side effects include reduced appetite and mild nausea. Insurance coverage needs verification. Declines  injections - Provide 30-day trial of Rybelsus 3 mg. - Instruct to take Rybelsus on empty stomach, 30 minutes before food or other medications. - Advise to check insurance coverage and cost of Rybelsus. - Instruct to contact provider in 3-4 weeks to discuss dose increase. - Consider increasing Rybelsus to 7 mg or 14 mg based on tolerance and effectiveness.  Chronic kidney disease, stage unspecified Slightly decreased kidney function with creatinine at 1.42 and GFR at 53. Emphasized controlling blood sugar and blood pressure. Advised against NSAIDs. - Monitor kidney function regularly. - Advise against use of NSAIDs. - Encourage adequate hydration.  Mild anemia Mild anemia with hemoglobin at 11.6. Non-iron deficiency anemia likely due to decreased hemoglobin production. No immediate intervention required.  Seborrheic keratosis Small, raised, itchy lesions consistent with seborrheic keratosis. Discussed over-the-counter treatment and potential dermatological interventions. - Recommend over-the-counter cortisone cream for itch relief. - Advise against daily use of cortisone cream to prevent skin hypopigmentation. - Consider dermatology referral for removal if lesions become bothersome.      Route labs to Russell [name] for CMET TSH T4  No orders of the defined types were placed in this encounter.   Meds ordered this encounter  Medications   RYBELSUS 3 MG TABS    Sig: Take 1 tablet (3 mg total) by mouth daily.    Follow up plan: Return in about 3 months (around 08/06/2024) for 3 month DM A1c 840 apt preferred.  Domingo Friend, DO [address] [facility] 05/06/2024, 8:56 AM

## 2024-05-09 NOTE — Progress Notes (Signed)
 EPIC Encounter for ICM Monitoring  Patient Name: Russell [name]. is a 73 y.o. male Date: 05/09/2024 Primary Care Physican: Raina Bunting, DO Primary Cardiologist: Jerelene Monday Electrophysiologist: Marven Slimmer 07/06/2023 Weight: 159 lbs 10/12/2023 Weight: 158-159 lbs 04/03/2024 Weight: 158-159 lbs 05/09/2024 Weight: 157-158 lbs                                                          Spoke with patient and heart failure questions reviewed.  Transmission results reviewed.  Pt asymptomatic for fluid accumulation.  Reports feeling well at this time and voices no complaints.      Diet:  Follows a low salt diet by eating a lot of fruits and vegetables and limiting fluid intake to 64 oz daily.   He rarely eats restaurants foods.     Optivol thoracic impedance suggesting normal fluid levels within the last month.   Prescribed: Spironolactone  25 mg take 1 tablet daily Furosemide  20 mg take 1 tablet (20 mg total) by mouth daily as needed   Labs: 10/19/2023 Creatinine 1.32, BUN 17, Potassium 4.3, Sodium 141, GFR 58  10/17/2023 Creatinine 1.32, BUN 15, Potassium 4.7, Sodium 140, GFR 58  04/26/2023 Creatinine 1.11, BUN 17, Potassium 4.9, Sodium 140, GFR >60 A complete set of results can be found in Results Review.   Recommendations:  No changes and encouraged to call if experiencing any fluid symptoms.   Follow-up plan: ICM clinic phone appointment on 06/09/2024.   91 day device clinic remote transmission 08/04/2024.    EP/Cardiology Next Office Visit:  05/16/2024 with Dr Gollan.   Recall 10/20/2024 with Dr Marven Slimmer.     Copy of ICM check sent to Dr. Marven Slimmer.      3 month ICM trend: 05/05/2024.    12-14 Month ICM trend:     [name], RN 05/09/2024 4:22 PM

## 2024-05-11 ENCOUNTER — Ambulatory Visit: Payer: Self-pay | Admitting: Cardiology

## 2024-05-14 DIAGNOSIS — H52209 Unspecified astigmatism, unspecified eye: Secondary | ICD-10-CM | POA: Diagnosis not present

## 2024-05-14 DIAGNOSIS — H5203 Hypermetropia, bilateral: Secondary | ICD-10-CM | POA: Diagnosis not present

## 2024-05-14 DIAGNOSIS — H524 Presbyopia: Secondary | ICD-10-CM | POA: Diagnosis not present

## 2024-05-15 NOTE — Progress Notes (Signed)
 Cardiology Office Note  Date:  05/16/2024   ID:  Whitman Meinhardt., DOB 08-15-1951, MRN [id number]  PCP:  Raina Bunting, DO   Chief Complaint  Patient presents with   12 month follow up     "Doing well."    HPI:  Mr. Saulter is a 73 year old gentleman with a history of  diabetes,  Hypertension, who initially presented for abnormal EKG. he had a colonoscopy. During the procedure, noted to have abnormal telemetry. EKG was performed that showed anterolateral wall abnormality.  echocardiogram showed depressed ejection fraction 25-30%.   2014 pharmacologic Myoview suggested mid to distal anterior wall perfusion defect concerning for ischemia. started on beta blocker and ACE inhibitor cardiac catheterization given the perfusion defect and severely depressed ejection fraction. -- no significant CAD, ejection fraction had improved up to 40%. Echo 9/20: EF is 30 to 35% He presents  for routine follow-up of his nonischemic cardiomyopathy and hypertension, has ICD  Last seen by myself in clinic 5/24 Active, helps son with garden work Wife works part time, sits with elderly  Side effects on jardiance , dizzy, dehydrated Could not take Currently taking Rybelsus , mild upset stomach A1c running higher  Weight stable Optivol stable, denies PND orthopnea no edema  Last echocardiogram 11/24: Ejection fraction 25 -30% Lab work reviewed A1C 8.4  EKG personally reviewed by myself on todays visit EKG Interpretation Date/Time:  Friday May 16 2024 10:40:53 EDT Ventricular Rate:  71 PR Interval:  184 QRS Duration:  90 QT Interval:  418 QTC Calculation: 454 R Axis:   15  Text Interpretation: Sinus rhythm with Premature atrial complexes with Abberant conduction Low voltage QRS ST & T wave abnormality, consider lateral ischemia When compared with ECG of 17-Oct-2023 10:55, Abberant conduction is now Present Confirmed by Belva Boyden [phone]) on 05/16/2024 10:55:45 AM  unchanged  Tested  COVID-positive October 05, 2022,  Reports having no symptoms from COVID, wife was sick in the past  Followed by EP in [place]  Other past medical history reviewed On 05/03/2019,  playing baseball,  developed chest pain, collapsed,  unresponsive. Bystander started CPR   in ventricular fibrillation and required 5 shocks while in the field with subsequent ROSC.  He was intubated  Total downtime estimated at approximately 5 minutes. Cooling protocol.  Troponin peaked at 0.69.   sustained VT on 05/06/2019 with successful defibrillation in the hospital  started on Amiodarone  infusion  05/07/2019,  right and left heart catheterization with mild luminal irregularities   severely reduced LVEF (20-25%) with global hypokinesis  ICD was placed  PMH:   has a past medical history of CAD (coronary artery disease), Diabetes (HCC), Frequent PVCs, Heart failure with reduced ejection fraction (HCC), Hypertension, Impotence of organic origin, NICM (nonischemic cardiomyopathy) (HCC), Pain in joint, shoulder region, Personal history of colonic polyps, and Ventricular tachycardia (HCC).  PSH:    Past Surgical History:  Procedure Laterality Date   CARDIAC CATHETERIZATION  11/17/2013   COLONOSCOPY  2009   ICD IMPLANT N/A 05/08/2019   Procedure: ICD IMPLANT;  Surgeon: Jolly Needle, MD;  Location: [facility] INVASIVE CV LAB;  Service: Cardiovascular;  Laterality: N/A;   RIGHT/LEFT HEART CATH AND CORONARY ANGIOGRAPHY N/A 05/07/2019   Procedure: RIGHT/LEFT HEART CATH AND CORONARY ANGIOGRAPHY;  Surgeon: Sammy Crisp, MD;  Location: [facility] INVASIVE CV LAB;  Service: Cardiovascular;  Laterality: N/A;    Current Outpatient Medications  Medication Sig Dispense Refill   Alcohol  Swabs (DROPSAFE ALCOHOL  PREP) 70 % PADS Apply topically.  amiodarone  (PACERONE ) 200 MG tablet TAKE 1/2 TABLET EVERY DAY 45 tablet 0   aspirin  81 MG chewable tablet Chew by mouth daily.     BD SYRINGE SLIP TIP 25G X 5/8" 1 ML MISC      Blood  Glucose Monitoring Suppl (ONE TOUCH ULTRA 2) w/Device KIT Use to check blood sugar x 1 daily 1 kit 0   brimonidine (ALPHAGAN) 0.2 % ophthalmic solution Place 1 drop into both eyes 2 (two) times daily.     carvedilol  (COREG ) 25 MG tablet TAKE 1 TABLET TWICE DAILY 180 tablet 0   Cholecalciferol (VITAMIN D3) 2000 units TABS Take 1 capsule by mouth daily.      dorzolamide-timolol  (COSOPT) 22.3-6.8 MG/ML ophthalmic solution Place 1 drop into both eyes 2 (two) times daily.     doxazosin  (CARDURA ) 1 MG tablet Take 1 tablet (1 mg total) by mouth 2 (two) times daily. 180 tablet 3   finasteride  (PROSCAR ) 5 MG tablet Take 1 tablet (5 mg total) by mouth daily. 90 tablet 3   furosemide  (LASIX ) 20 MG tablet Take 1 tablet (20 mg total) by mouth daily as needed. 90 tablet 3   isosorbide  mononitrate (IMDUR ) 60 MG 24 hr tablet TAKE 1 AND 1/2 TABLETS TWICE DAILY 270 tablet 3   Lancets (ONETOUCH DELICA PLUS LANCET33G) MISC USE TO CHECK BLOOD SUGAR UP TO 1 TIME DAILY 100 each 3   latanoprost  (XALATAN ) 0.005 % ophthalmic solution Place 1 drop into both eyes at bedtime.      metFORMIN  (GLUCOPHAGE -XR) 500 MG 24 hr tablet Take 2 tablets (1,000 mg total) by mouth daily with breakfast. 180 tablet 3   nitroGLYCERIN  (NITROSTAT ) 0.4 MG SL tablet DISSOLVE ONE TABLET UNDER THE TONGUE EVERY 5 MINUTES AS NEEDED FOR CHEST PAIN.  DO NOT EXCEED A TOTAL OF 3 DOSES IN 15 MINUTES 25 tablet 1   ONETOUCH ULTRA TEST test strip TEST BLOOD SUGAR ONE TIME DAILY 100 strip 3   pravastatin  (PRAVACHOL ) 10 MG tablet Take 1 tablet (10 mg total) by mouth at bedtime. 90 tablet 3   RYBELSUS  3 MG TABS Take 1 tablet (3 mg total) by mouth daily.     sacubitril -valsartan  (ENTRESTO ) 97-103 MG Take 1 tablet by mouth 2 (two) times daily. 180 tablet 1   spironolactone  (ALDACTONE ) 25 MG tablet TAKE 1 TABLET EVERY DAY 90 tablet 0   tamsulosin  (FLOMAX ) 0.4 MG CAPS capsule Take 1 capsule (0.4 mg total) by mouth daily. 90 capsule 3   No current  facility-administered medications for this visit.    Allergies:   Patient has no known allergies.   Social History:  The patient  reports that he has never smoked. He has never used smokeless tobacco. He reports current alcohol  use. He reports that he does not use drugs.   Family History:   family history includes Colon cancer in his father; Diabetes in his mother; Hypertension in his mother.    Review of Systems: Review of Systems  Constitutional: Negative.   HENT: Negative.    Respiratory: Negative.    Cardiovascular: Negative.   Gastrointestinal: Negative.   Musculoskeletal: Negative.   Neurological: Negative.   Psychiatric/Behavioral: Negative.    All other systems reviewed and are negative.   PHYSICAL EXAM: VS:  BP 108/66 (BP Location: Left Arm, Patient Position: Sitting, Cuff Size: Normal)   Pulse 71   Ht 5\' 8"  (1.727 m)   Wt 157 lb 8 oz (71.4 kg)   SpO2 97%   BMI 23.95 kg/m  ,  BMI Body mass index is 23.95 kg/m. Constitutional:  oriented to person, place, and time. No distress.  HENT:  Head: Grossly normal Eyes:  no discharge. No scleral icterus.  Neck: No JVD, no carotid bruits  Cardiovascular: Regular rate and rhythm, no murmurs appreciated Pulmonary/Chest: Clear to auscultation bilaterally, no wheezes or rales Abdominal: Soft.  no distension.  no tenderness.  Musculoskeletal: Normal range of motion Neurological:  normal muscle tone. Coordination normal. No atrophy Skin: Skin warm and dry Psychiatric: normal affect, pleasant  Recent Labs: 04/24/2024: ALT 8; BUN 17; Creat 1.42; Hemoglobin 11.6; Platelets 143; Potassium 4.1; Sodium 142; TSH 1.04   Lipid Panel Lab Results  Component Value Date   CHOL 140 10/19/2023   HDL 56 10/19/2023   LDLCALC 68 10/19/2023   TRIG 82 10/19/2023    Wt Readings from Last 3 Encounters:  05/16/24 157 lb 8 oz (71.4 kg)  05/06/24 157 lb (71.2 kg)  04/23/24 157 lb (71.2 kg)     ASSESSMENT AND PLAN:  Congestive dilated  cardiomyopathy (HCC)  continue Coreg  25 twice daily isosorbide  90 twice daily Entresto  97/103 twice daily spironolactone  25 daily We will start prescription for Lasix  20 daily as needed  for any ankle swelling or abdominal distention, weight gain Did not tolerate Jardiance  in the past OptiVol low, stable  Nonischemic cardiomyopathy Nonobstructive coronary disease by prior catheterization 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, blood pressure well controlled Ejection fraction 30 to 35% September 2020,  Most recent echo ejection fraction 25 to 30% Continue medications as above Euvolemic  Controlled type 2 diabetes mellitus without complication, unspecified long term insulin  use status (HCC) -- A1c greater than 8, discussed low carbohydrate diet  VF Defibrillator in place, no recent events Stable low OptiVol, Lasix  as needed Followed by EP    Orders Placed This Encounter  Procedures   EKG 12-Lead      Signed, Juanda Noon, M.D., Ph.D. 05/16/2024  [facility] [place], [place] [phone]

## 2024-05-16 ENCOUNTER — Encounter: Payer: Self-pay | Admitting: Cardiovascular Disease

## 2024-05-16 ENCOUNTER — Ambulatory Visit: Attending: Cardiovascular Disease | Admitting: Cardiovascular Disease

## 2024-05-16 VITALS — BP 108/66 | HR 71 | Ht 68.0 in | Wt 157.5 lb

## 2024-05-16 DIAGNOSIS — E1136 Type 2 diabetes mellitus with diabetic cataract: Secondary | ICD-10-CM

## 2024-05-16 DIAGNOSIS — I5022 Chronic systolic (congestive) heart failure: Secondary | ICD-10-CM | POA: Diagnosis not present

## 2024-05-16 DIAGNOSIS — I4901 Ventricular fibrillation: Secondary | ICD-10-CM | POA: Diagnosis not present

## 2024-05-16 DIAGNOSIS — I428 Other cardiomyopathies: Secondary | ICD-10-CM

## 2024-05-16 DIAGNOSIS — I472 Ventricular tachycardia, unspecified: Secondary | ICD-10-CM | POA: Diagnosis not present

## 2024-05-16 DIAGNOSIS — I10 Essential (primary) hypertension: Secondary | ICD-10-CM

## 2024-05-16 DIAGNOSIS — Z95810 Presence of automatic (implantable) cardiac defibrillator: Secondary | ICD-10-CM

## 2024-05-16 MED ORDER — DOXAZOSIN MESYLATE 1 MG PO TABS: 1.0000 mg | ORAL_TABLET | Freq: Two times a day (BID) | ORAL | 3 refills | Status: AC

## 2024-05-16 MED ORDER — SPIRONOLACTONE 25 MG PO TABS: 25.0000 mg | ORAL_TABLET | Freq: Every day | ORAL | 3 refills | Status: AC

## 2024-05-16 MED ORDER — SACUBITRIL-VALSARTAN 97-103 MG PO TABS: 1.0000 | ORAL_TABLET | Freq: Two times a day (BID) | ORAL | 3 refills | Status: DC

## 2024-05-16 MED ORDER — NITROGLYCERIN 0.4 MG SL SUBL: SUBLINGUAL_TABLET | SUBLINGUAL | 1 refills | Status: AC

## 2024-05-16 MED ORDER — ISOSORBIDE MONONITRATE ER 60 MG PO TB24: ORAL_TABLET | ORAL | 3 refills | Status: AC

## 2024-05-16 MED ORDER — CARVEDILOL 25 MG PO TABS: 25.0000 mg | ORAL_TABLET | Freq: Two times a day (BID) | ORAL | 3 refills | Status: AC

## 2024-05-16 MED ORDER — FUROSEMIDE 20 MG PO TABS: 20.0000 mg | ORAL_TABLET | Freq: Every day | ORAL | 3 refills | Status: AC | PRN

## 2024-05-16 NOTE — Patient Instructions (Signed)

## 2024-05-27 ENCOUNTER — Other Ambulatory Visit: Payer: Self-pay | Admitting: Family Medicine

## 2024-05-27 DIAGNOSIS — E1136 Type 2 diabetes mellitus with diabetic cataract: Secondary | ICD-10-CM

## 2024-05-27 NOTE — Telephone Encounter (Unsigned)
 Copied from CRM [id number]. Topic: Clinical - Medication Refill >> May 27, 2024  1:29 PM DeAngela L wrote: Medication: RYBELSUS  3 MG TABS   Has the patient contacted their pharmacy? No  (Agent: If no, request that the patient contact the pharmacy for the refill. If patient does not wish to contact the pharmacy document the reason why and proceed with request.) (Agent: If yes, when and what did the pharmacy advise?)  This is the patient's preferred pharmacy:  [facility] - [place], [place] - [address] [place] [address] Phone: [phone] Fax: [phone]  Is this the correct pharmacy for this prescription? Yes  If no, delete pharmacy and type the correct one.   Has the prescription been filled recently? Yes   Is the patient out of the medication? No   Has the patient been seen for an appointment in the last year OR does the patient have an upcoming appointment? Yes   Can we respond through MyChart? Yes   Agent: Please be advised that Rx refills may take up to 3 business days. We ask that you follow-up with your pharmacy.

## 2024-05-28 MED ORDER — RYBELSUS 3 MG PO TABS: 3.0000 mg | ORAL_TABLET | Freq: Every day | ORAL | Status: DC

## 2024-05-28 NOTE — Telephone Encounter (Signed)
 Requested medication (s) are due for refill today -yes  Requested medication (s) are on the active medication list -yes  Future visit scheduled -yes  Last refill: 05/06/24 sample  Notes to clinic: off protocol- provider review   Requested Prescriptions  Pending Prescriptions Disp Refills   RYBELSUS  3 MG TABS      Sig: Take 1 tablet (3 mg total) by mouth daily.     Off-Protocol Failed - 05/28/2024  2:32 PM      Failed - Medication not assigned to a protocol, review manually.      Failed - Valid encounter within last 12 months    Recent Outpatient Visits           3 weeks ago 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 [facility])   [place] [facility] [place], Kayleen Party, DO       Future Appointments             In 8 months Estanislao Heimlich, Dennard Fisher, MD [facility] [place]               Requested Prescriptions  Pending Prescriptions Disp Refills   RYBELSUS  3 MG TABS      Sig: Take 1 tablet (3 mg total) by mouth daily.     Off-Protocol Failed - 05/28/2024  2:32 PM      Failed - Medication not assigned to a protocol, review manually.      Failed - Valid encounter within last 12 months    Recent Outpatient Visits           3 weeks ago 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 [facility])   [place] [facility] [place], Kayleen Party, DO       Future Appointments             In 8 months Estanislao Heimlich, Dennard Fisher, MD [facility] [place]

## 2024-06-02 ENCOUNTER — Telehealth: Payer: Self-pay

## 2024-06-02 DIAGNOSIS — E1136 Type 2 diabetes mellitus with diabetic cataract: Secondary | ICD-10-CM

## 2024-06-02 MED ORDER — JANUVIA 100 MG PO TABS: 100.0000 mg | ORAL_TABLET | Freq: Every day | ORAL | 5 refills | Status: AC

## 2024-06-02 NOTE — Telephone Encounter (Signed)
 He can stop Rybelsus , and new rx Januvia 100mg  daily sent to his [employer]. This one has less side effects than Rybelsus . He can take with or without meal.  Domingo Friend, DO [facility] 06/02/2024, 12:51 PM

## 2024-06-02 NOTE — Telephone Encounter (Signed)
 Copied from CRM [id number]. Topic: Clinical - Medical Advice >> Jun 02, 2024  8:35 AM Dorthula Gavel H wrote: Reason for CRM: pt would like a nurse to call him in regards to RYBELSUS  3 MG TABS. States he has a few questions about it

## 2024-06-02 NOTE — Telephone Encounter (Signed)
 Patient notified of all instructions, stop rybelsus  start januvia. Verbal understanding

## 2024-06-02 NOTE — Addendum Note (Signed)
 Addended by: Raina Bunting on: 06/02/2024 12:53 PM   Modules accepted: Orders

## 2024-06-04 ENCOUNTER — Other Ambulatory Visit: Admitting: Pharmacist

## 2024-06-04 ENCOUNTER — Telehealth: Payer: Self-pay

## 2024-06-04 ENCOUNTER — Encounter: Payer: Self-pay | Admitting: Pharmacist

## 2024-06-04 DIAGNOSIS — Z7984 Long term (current) use of oral hypoglycemic drugs: Secondary | ICD-10-CM

## 2024-06-04 DIAGNOSIS — E1136 Type 2 diabetes mellitus with diabetic cataract: Secondary | ICD-10-CM

## 2024-06-04 NOTE — Telephone Encounter (Signed)
 PAP: Patient assistance application for Januvia through [employer] has been mailed to pt's home address on file. Provider portion of application will be faxed to provider's office.

## 2024-06-04 NOTE — Patient Instructions (Signed)
 Goals Addressed             This Visit's Progress    Pharmacy Goals       Please watch the mail for an envelope from [facility] containing the patient assistance program application. Please complete this application and bring to office to have it faxed back to Attention: Palmer Bobo at Fax # [phone] along with a copy of your Medicare Part D prescription card and a copy of your proof of income document.  If you need to call Jullie Oiler, you can reach her at [phone].  If you need to reach out to patient assistance programs regarding refills or to find out the status of your application, you can do so by calling:  [employer] [phone]   Our goal A1c is less than 7%. This corresponds with fasting sugars less than 130 and 2 hour after meal sugars less than 180. Please keep a log of your results when checking your blood sugar    Our blood pressure goal is less than 130/80.  To appropriately check your blood pressure, make sure you do the following:  1) Avoid caffeine, exercise, or tobacco products for 30 minutes before checking. Empty your bladder. 2) Sit with your back supported in a flat-backed chair. Rest your arm on something flat (arm of the chair, table, etc). 3) Sit still with your feet flat on the floor, resting, for at least 5 minutes.  4) Check your blood pressure. Take 1-2 readings.  5) Write down these readings and bring with you to any provider appointments.  Bring your home blood pressure machine with you to a provider's office for accuracy comparison at least once a year.   Make sure you take your blood pressure medications before you come to any office visit, even if you were asked to fast for labs.  Our goal bad cholesterol, or LDL, is less than 70 . This is why it is important to continue taking your pravastatin .  Thank you!    Arthur Lash, PharmD, Becky Bowels, CPP Clinical Pharmacist [facility] [phone]

## 2024-06-04 NOTE — Progress Notes (Signed)
 06/04/2024 Name: Russell [name]. MRN: [id number] DOB: [birth year]  Chief Complaint  Patient presents with   Medication Assistance    Russell [name]. is a 73 y.o. year old male who was referred to the pharmacist by their PCP for assistance in managing diabetes, hypertension, medication access, and complex medication management.   Receive a call from patient requesting a call back regarding questions about his new prescription for Januvia. Return call to patient   Subjective:   Care Team: Primary Care Provider: Raina Bunting, DO ; Next Scheduled Visit: 08/12/2024 Cardiologist: Devorah Fonder, MD Urology: Lawerence Pressman, MD  Medication Access/Adherence  Current Pharmacy:  [facility] - [place], [place] - [address] [place] [address] Phone: [phone] Fax: [phone]  [facility] - [place], [place] - [address] [place] [address] Phone: [phone] Fax: [phone]   Patient reports affordability concerns with their medications: Yes  Patient reports access/transportation concerns to their pharmacy: No  Patient reports adherence concerns with their medications:  No     Reports cost of his Januvia is difficult to afford. Reports picked up a 1 month supply from pharmacy and cost was >$300.   Diabetes:   Current medications:  - metformin  ER 500 mg - 2 tablets daily - Januvia 100 mg daily (started 6/9)   Previous therapies tried: Jardiance  10 mg daily (hypotension; urinary symptoms); was unable to tolerate dose of metformin  ER >1000 mg/day; Rybelsus  (GI upset/no appetite)   Recent morning fasting reading yesterday: 124   Patient denies hypoglycemic s/sx including dizziness, shakiness, sweating.    Reports has cut back on sweets and carbohydrates   Statin: pravastatin  10 mg daily     Objective:  Lab Results  Component Value Date   HGBA1C 8.4 (H) 04/24/2024    Lab Results   Component Value Date   CREATININE 1.42 (H) 04/24/2024   BUN 17 04/24/2024   NA 142 04/24/2024   K 4.1 04/24/2024   CL 109 04/24/2024   CO2 26 04/24/2024    Lab Results  Component Value Date   CHOL 140 10/19/2023   HDL 56 10/19/2023   LDLCALC 68 10/19/2023   TRIG 82 10/19/2023   CHOLHDL 2.5 10/19/2023    Current Outpatient Medications on File Prior to Visit  Medication Sig Dispense Refill   JANUVIA 100 MG tablet Take 1 tablet (100 mg total) by mouth daily. 30 tablet 5   metFORMIN  (GLUCOPHAGE -XR) 500 MG 24 hr tablet Take 2 tablets (1,000 mg total) by mouth daily with breakfast. 180 tablet 3   Alcohol  Swabs (DROPSAFE ALCOHOL  PREP) 70 % PADS Apply topically.     amiodarone  (PACERONE ) 200 MG tablet TAKE 1/2 TABLET EVERY DAY 45 tablet 0   aspirin  81 MG chewable tablet Chew by mouth daily.     BD SYRINGE SLIP TIP 25G X 5/8 1 ML MISC      Blood Glucose Monitoring Suppl (ONE TOUCH ULTRA 2) w/Device KIT Use to check blood sugar x 1 daily 1 kit 0   brimonidine (ALPHAGAN) 0.2 % ophthalmic solution Place 1 drop into both eyes 2 (two) times daily.     carvedilol  (COREG ) 25 MG tablet Take 1 tablet (25 mg total) by mouth 2 (two) times daily. 180 tablet 3   Cholecalciferol (VITAMIN D3) 2000 units TABS Take 1 capsule by mouth daily.      dorzolamide-timolol  (COSOPT) 22.3-6.8 MG/ML ophthalmic  solution Place 1 drop into both eyes 2 (two) times daily.     doxazosin  (CARDURA ) 1 MG tablet Take 1 tablet (1 mg total) by mouth 2 (two) times daily. 180 tablet 3   finasteride  (PROSCAR ) 5 MG tablet Take 1 tablet (5 mg total) by mouth daily. 90 tablet 3   furosemide  (LASIX ) 20 MG tablet Take 1 tablet (20 mg total) by mouth daily as needed. 90 tablet 3   isosorbide  mononitrate (IMDUR ) 60 MG 24 hr tablet TAKE 1 AND 1/2 TABLETS TWICE DAILY 270 tablet 3   Lancets (ONETOUCH DELICA PLUS LANCET33G) MISC USE TO CHECK BLOOD SUGAR UP TO 1 TIME DAILY 100 each 3   latanoprost  (XALATAN ) 0.005 % ophthalmic solution Place  1 drop into both eyes at bedtime.      nitroGLYCERIN  (NITROSTAT ) 0.4 MG SL tablet DISSOLVE ONE TABLET UNDER THE TONGUE EVERY 5 MINUTES AS NEEDED FOR CHEST PAIN.  DO NOT EXCEED A TOTAL OF 3 DOSES IN 15 MINUTES 25 tablet 1   ONETOUCH ULTRA TEST test strip TEST BLOOD SUGAR ONE TIME DAILY 100 strip 3   pravastatin  (PRAVACHOL ) 10 MG tablet Take 1 tablet (10 mg total) by mouth at bedtime. 90 tablet 3   sacubitril -valsartan  (ENTRESTO ) 97-103 MG Take 1 tablet by mouth 2 (two) times daily. 180 tablet 3   spironolactone  (ALDACTONE ) 25 MG tablet Take 1 tablet (25 mg total) by mouth daily. 90 tablet 3   tamsulosin  (FLOMAX ) 0.4 MG CAPS capsule Take 1 capsule (0.4 mg total) by mouth daily. 90 capsule 3   No current facility-administered medications on file prior to visit.      Assessment/Plan:   From review of [employer] Medicare website, note patient has a $450 annual deductible (tiers 3-5) for his prescription plan and tier 3 copayment of $47/month  Diabetes: - Reviewed long term cardiovascular and renal outcomes of uncontrolled blood sugar - Reviewed goal A1c, goal fasting, and goal 2 hour post prandial glucose - Reviewed dietary modifications including importance of having regular well-balanced meals throughout the day, while controlling carbohydrate portion sizes - Recommend to check glucose, keep log of results and have this record to review at upcoming medical appointments. Patient to contact provider office sooner if needed for readings outside of established parameters or symptoms             Encouraged patient to use glucose checks as feedback on dietary choices - Meets financial criteria for Januvia patient assistance program through [employer]. Will collaborate with provider, CPhT, and patient to pursue assistance.     Follow Up Plan: Clinical Pharmacist will follow up with patient by telephone on 07/07/2024 at 8:30 AM     Arthur Lash, PharmD, Becky Bowels, CPP Clinical Pharmacist [facility] [phone]

## 2024-06-09 ENCOUNTER — Ambulatory Visit: Attending: Cardiology

## 2024-06-09 DIAGNOSIS — I5022 Chronic systolic (congestive) heart failure: Secondary | ICD-10-CM

## 2024-06-09 DIAGNOSIS — Z95810 Presence of automatic (implantable) cardiac defibrillator: Secondary | ICD-10-CM

## 2024-06-12 NOTE — Telephone Encounter (Signed)
 Received Provider portion of PAP application for Januvia  [employer]) - will follow up with patient portion soon.

## 2024-06-13 NOTE — Progress Notes (Signed)
 EPIC Encounter for ICM Monitoring  Patient Name: Russell [name]. is a 73 y.o. male Date: 06/13/2024 Primary Care Physican: Russell Bunting, DO Primary Cardiologist: Russell [name] Electrophysiologist: Russell [name] 07/06/2023 Weight: 159 lbs 10/12/2023 Weight: 158-159 lbs 04/03/2024 Weight: 158-159 lbs 05/09/2024 Weight: 157-158 lbs 06/13/2024 Weight: 155-158 lbs                                                          Spoke with patient and heart failure questions reviewed.  Transmission results reviewed.  Pt asymptomatic for fluid accumulation.  Reports feeling well at this time and voices no complaints.      Diet:  Follows a low salt diet by eating a lot of fruits and vegetables and limiting fluid intake to 64 oz daily.   He rarely eats restaurants foods.     Optivol thoracic impedance suggesting normal fluid levels within the last month.   Prescribed: Spironolactone  25 mg take 1 tablet daily Furosemide  20 mg take 1 tablet (20 mg total) by mouth daily as needed   Labs: 04/24/2024 Creatinine 1.42, BUN 17, Potassium 4.1, Sodium 142, GFR 53 A complete set of results can be found in Results Review.   Recommendations:  No changes and encouraged to call if experiencing any fluid symptoms.   Follow-up plan: ICM clinic phone appointment on 07/28/2024.   91 day device clinic remote transmission 08/04/2024.    EP/Cardiology Next Office Visit:  Recall 05/16/2025 with Dr Gollan.   Recall 10/20/2024 with Russell Riddle, NP.     Copy of ICM check sent to Dr. Marven [name].      3 month ICM trend: 06/09/2024.    12-14 Month ICM trend:     Russell Jain, RN 06/13/2024 2:25 PM

## 2024-06-18 NOTE — Telephone Encounter (Signed)
 Reached out to patient regarding status of Pap application Januvia  ([employer]) and he has returned it this week.  Will be looking for it to come to the office soon.

## 2024-06-19 NOTE — Telephone Encounter (Signed)
 PAP: Application for Januvia has been submitted to [employer], via fax

## 2024-06-19 NOTE — Addendum Note (Signed)
 Addended by: TAWNI DRILLING D on: 06/19/2024 09:37 AM   Modules accepted: Orders

## 2024-06-19 NOTE — Progress Notes (Signed)
 Remote ICD transmission.

## 2024-07-07 ENCOUNTER — Other Ambulatory Visit: Admitting: Pharmacist

## 2024-07-07 DIAGNOSIS — I5022 Chronic systolic (congestive) heart failure: Secondary | ICD-10-CM

## 2024-07-07 DIAGNOSIS — I10 Essential (primary) hypertension: Secondary | ICD-10-CM

## 2024-07-07 DIAGNOSIS — Z7984 Long term (current) use of oral hypoglycemic drugs: Secondary | ICD-10-CM

## 2024-07-07 DIAGNOSIS — E1136 Type 2 diabetes mellitus with diabetic cataract: Secondary | ICD-10-CM

## 2024-07-07 NOTE — Progress Notes (Signed)
 07/07/2024 Name: Russell [name]. MRN: [id number] DOB: 1951/02/04  Chief Complaint  Patient presents with   Medication Assistance    Russell [name]. is a 73 y.o. year old male who was referred to the pharmacist by their PCP for assistance in managing diabetes, hypertension, medication access, and complex medication management.   Subjective:   Care Team: Primary Care Provider: Edman [name] PARAS, DO ; Next Scheduled Visit: 08/12/2024 Cardiologist: Russell Evalene PARAS, MD Urology: Russell Redell BROCKS, MD  Medication Access/Adherence  Current Pharmacy:  [facility] - [place], [place] - [address] [place] [address] Phone: [phone] Fax: [phone]  [facility] - [place], [place] - [address] [place] [address] Phone: [phone] Fax: [phone]   Patient reports affordability concerns with their medications: Yes  Patient reports access/transportation concerns to their pharmacy: No  Patient reports adherence concerns with their medications:  No      Diabetes:   Current medications:  - metformin  ER 500 mg - 2 tablets daily - Januvia  100 mg daily (started 6/9)   Previous therapies tried: Jardiance  10 mg daily (hypotension; urinary symptoms); was unable to tolerate dose of metformin  ER >1000 mg/day; Rybelsus  (GI upset/no appetite)   Recent morning fasting reading ranging: 114-147   Patient denies hypoglycemic s/sx including dizziness, shakiness, sweating.    Reports has cut back on sweets and carbohydrates   Statin: pravastatin  10 mg daily  Current physical activity: Walking at the mall ~45 minutes x 2 times/week  Current medication access support: collaborating with PCP and CPhT to support patient with applying for patient assistance for Januvia  from [employer] Today patient shares that he received the attestation letter in the mail from [employer], completed and mailed this back to program last  week   Hypertension/Systolic CHF/Cardiomyopathy:   Patient followed by [facility]   Current medications:  - carvedilol  25 mg twice daily - isosorbide  ER 60 mg - 1.5 tablets (90 mg) twice daily - Entresto  97/103 twice daily - spironolactone  25 mg daily - doxazosin  1 mg twice daily  - furosemide  20 daily as needed for any ankle swelling or abdominal distention, weight gain   Previous therapies tried: Jardiance  10 mg daily (hypotension; urinary symptoms)   Patient has an automated, upper arm home BP cuff (>39 years old and he wonders if needs new monitor) Recent blood pressure readings  Today: 94/61, HR 72    Denies symptoms of hypotension such as dizziness or lightheadedness as long as takes positional changes slowly   Confirms weighs himself daily as recommended by Cardiologist; weight staying ~152 lbs - Denies s/sx of swelling at this time    Current medication access support:  - Entresto  patient assistance from [employer] (paperwork completed by Cardiology)   Current physical activity: Walking at the mall ~45 minutes x 2 times/week   Objective:  Lab Results  Component Value Date   HGBA1C 8.4 (H) 04/24/2024    Lab Results  Component Value Date   CREATININE 1.42 (H) 04/24/2024   BUN 17 04/24/2024   NA 142 04/24/2024   K 4.1 04/24/2024   CL 109 04/24/2024   CO2 26 04/24/2024    Lab Results  Component Value Date   CHOL 140 10/19/2023   HDL 56 10/19/2023   LDLCALC 68 10/19/2023   TRIG 82 10/19/2023   CHOLHDL 2.5 10/19/2023   BP Readings from Last 3 Encounters:  05/16/24 108/66  05/06/24 130/70  04/23/24 132/74   Pulse Readings from Last 3 Encounters:  05/16/24 71  05/06/24 68  04/23/24 60     Medications Reviewed Today     Reviewed by Alana Sharyle LABOR, RPH-CPP (Pharmacist) on 07/07/24 at 0847  Med List Status: <None>   Medication Order Taking? Sig Documenting Provider Last Dose Status Informant  Alcohol  Swabs (DROPSAFE ALCOHOL   PREP) 70 % PADS [id number]  Apply topically. [provider]  Active   amiodarone  (PACERONE ) 200 MG tablet [id number]  TAKE 1/2 TABLET EVERY DAY Riddle, Suzann, NP  Active   aspirin  81 MG chewable tablet [id number]  Chew by mouth daily. [provider]  Active   BD SYRINGE SLIP TIP 25G X 5/8 1 ML MISC [id number]   [provider]  Active   Blood Glucose Monitoring Suppl (ONE TOUCH ULTRA 2) w/Device KIT [id number]  Use to check blood sugar x 1 daily Karamalegos, [name] PARAS, DO  Active   brimonidine (ALPHAGAN) 0.2 % ophthalmic solution [id number]  Place 1 drop into both eyes 2 (two) times daily. [provider]  Active   carvedilol  (COREG ) 25 MG tablet [id number] Yes Take 1 tablet (25 mg total) by mouth 2 (two) times daily. Gollan, Timothy J, MD  Active   Cholecalciferol (VITAMIN D3) 2000 units TABS [id number]  Take 1 capsule by mouth daily.  [provider]  Active Spouse/Significant Other  dorzolamide-timolol  (COSOPT) 22.3-6.8 MG/ML ophthalmic solution [id number]  Place 1 drop into both eyes 2 (two) times daily. [provider]  Active   doxazosin  (CARDURA ) 1 MG tablet [id number] Yes Take 1 tablet (1 mg total) by mouth 2 (two) times daily. Gollan, Timothy J, MD  Active   finasteride  (PROSCAR ) 5 MG tablet [id number]  Take 1 tablet (5 mg total) by mouth daily. Russell Redell BROCKS, MD  Active   furosemide  (LASIX ) 20 MG tablet [id number] Yes Take 1 tablet (20 mg total) by mouth daily as needed. Gollan, Timothy J, MD  Active   isosorbide  mononitrate (IMDUR ) 60 MG 24 hr tablet [id number] Yes TAKE 1 AND 1/2 TABLETS TWICE DAILY Gollan, Timothy J, MD  Active   JANUVIA  100 MG tablet [id number] Yes Take 1 tablet (100 mg total) by mouth daily. Russell [name] PARAS, DO  Active   Lancets [facility] DELICA PLUS Ashley) MISC [id number]  USE TO CHECK BLOOD SUGAR UP TO 1 TIME DAILY Russell [name] PARAS, DO  Active   latanoprost  (XALATAN ) 0.005 % ophthalmic solution  [id number]  Place 1 drop into both eyes at bedtime.  [provider]  Active Spouse/Significant Other  metFORMIN  (GLUCOPHAGE -XR) 500 MG 24 hr tablet [id number] Yes Take 2 tablets (1,000 mg total) by mouth daily with breakfast. Russell [name] PARAS, DO  Active   nitroGLYCERIN  (NITROSTAT ) 0.4 MG SL tablet [id number]  DISSOLVE ONE TABLET UNDER THE TONGUE EVERY 5 MINUTES AS NEEDED FOR CHEST PAIN.  DO NOT EXCEED A TOTAL OF 3 DOSES IN 15 MINUTES Gollan, Timothy J, MD  Active   [facility] ULTRA TEST test strip [id number]  TEST BLOOD SUGAR ONE TIME DAILY Russell [name] PARAS, DO  Active   pravastatin  (PRAVACHOL ) 10 MG tablet [id number]  Take 1 tablet (10 mg total) by mouth at bedtime. Russell [name] PARAS, DO  Active   sacubitril -valsartan  (ENTRESTO ) 97-103 MG [id number] Yes Take 1 tablet by mouth 2 (two) times daily. Gollan, Timothy J, MD  Active   spironolactone  (ALDACTONE ) 25 MG tablet [id number] Yes Take 1 tablet (25 mg total) by mouth daily. Gollan,  Evalene PARAS, MD  Active   tamsulosin  (FLOMAX ) 0.4 MG CAPS capsule [id number]  Take 1 capsule (0.4 mg total) by mouth daily. Russell Redell BROCKS, MD  Active               Assessment/Plan:   Diabetes: - Reviewed long term cardiovascular and renal outcomes of uncontrolled blood sugar - Reviewed goal A1c, goal fasting, and goal 2 hour post prandial glucose - Reviewed dietary modifications including importance of having regular well-balanced meals throughout the day, while controlling carbohydrate portion sizes - Recommend to check glucose, keep log of results and have this record to review at upcoming medical appointments. Patient to contact provider office sooner if needed for readings outside of established parameters or symptoms             Encouraged patient to use glucose checks as feedback on dietary choices - Collaborating with PCP and CPhT to support patient with applying for patient assistance for Januvia  from [employer]    Hypertension/Systolic CHF/Cardiomyopathy: - Recommended to continue to check home blood pressure and heart rate as well as daily weight, keep log of results and have record to review during future appointments Patient to contact Cardiology or PCP office if noting readings outside of established parameters or if having any symptoms, including any dizziness or lightheadedness Encourage patient to bring current home monitor with him to next office visit - Encourage patient to continue to take positional changes slowly - Have counseled patient on importance of taking his blood pressure medications before coming to his medical appointments    Health Maintenance - Discussed CDC/ACIP recommendations for Tetanus and Shingrix vaccine. Recommended to follow up with pharmacy to obtain vaccinations     Follow Up Plan: Clinical Pharmacist will follow up with patient by telephone on 08/18/2024 at 8:30 AM     Sharyle Sia, PharmD, JAQUELINE, CPP Clinical Pharmacist [facility] [phone]

## 2024-07-07 NOTE — Patient Instructions (Signed)
 Goals Addressed             This Visit's Progress    Pharmacy Goals       If you need to call Suzen, you can reach her at [phone].  If you need to reach out to patient assistance programs regarding refills or to find out the status of your application, you can do so by calling:  [employer] [phone]   Our goal A1c is less than 7%. This corresponds with fasting sugars less than 130 and 2 hour after meal sugars less than 180. Please keep a log of your results when checking your blood sugar    Our blood pressure goal is less than 130/80.  To appropriately check your blood pressure, make sure you do the following:  1) Avoid caffeine, exercise, or tobacco products for 30 minutes before checking. Empty your bladder. 2) Sit with your back supported in a flat-backed chair. Rest your arm on something flat (arm of the chair, table, etc). 3) Sit still with your feet flat on the floor, resting, for at least 5 minutes.  4) Check your blood pressure. Take 1-2 readings.  5) Write down these readings and bring with you to any provider appointments.  Bring your home blood pressure machine with you to a provider's office for accuracy comparison at least once a year.   Make sure you take your blood pressure medications before you come to any office visit, even if you were asked to fast for labs.  Our goal bad cholesterol, or LDL, is less than 70 . This is why it is important to continue taking your pravastatin .  Thank you!    Sharyle Sia, PharmD, JAQUELINE, CPP Clinical Pharmacist [facility] [phone]

## 2024-07-09 ENCOUNTER — Telehealth: Payer: Self-pay | Admitting: Cardiovascular Disease

## 2024-07-09 NOTE — Telephone Encounter (Signed)
 Called and spoke with the patient.  Patient reports feeling much better after eating the watermelon and drinking some Gatorade.  Patient was asked to take his blood pressure and he reported it to be 129/77 with 70 bpm heart rate.  Patient was advised to stay well hydrated especially if being out in the heat during the day.  Patient verbalized understanding and expressed gratitude for the call.

## 2024-07-09 NOTE — Telephone Encounter (Signed)
 Pt c/o BP issue: STAT if pt c/o blurred vision, one-sided weakness or slurred speech.  STAT if BP is GREATER than 180/120 TODAY.  STAT if BP is LESS than 90/60 and SYMPTOMATIC TODAY  1. What is your BP concern? BP too low   2. Have you taken any BP medication today? Yes  3. What are your last 5 BP readings? 84/52 Today   4. Are you having any other symptoms (ex. Dizziness, headache, blurred vision, passed out)? lightheaded

## 2024-07-09 NOTE — Telephone Encounter (Signed)
 Spoke with the patient when call center transferred the call over the stat phone.  Patient confirmed low blood pressure and slight lightheadedness with no other symptoms.  Patient reported drinking one about 16 oz bottle of water  since morning coffee.  Patient is diabetic and was asked to check his blood sugar, which he did and reported that it was 92.  Patient reported normally running in the 130s and 140s.  Asked if the patient had taken his Lasix  and Spironolactone  which he confirmed.  Patient also reported that he had been outside some today.  Patient was asked if he had any Gatorade at home, which he did not.  Patient was asked if he had any watermelon, which he confirmed he did.  Patient was advised to eat a small bowl full of watermelon and drink a glass of water  and this RN would call him back in about half an hour to check on him. Patient verbalized understanding and expressed gratitude for the call.

## 2024-07-11 NOTE — Telephone Encounter (Signed)
 PAP: Patient assistance application for Januvia  has been approved by PAP Companies: [employer] from 07/07/2024 to 12/24/2024. Medication should be delivered to PAP Delivery: Home. For further shipping updates, please contact [employer] at [phone]. Patient ID is: not provided  Pharmacy processed order on 07/08/24 and was delivered to Home address on 07/10/2024

## 2024-07-11 NOTE — Telephone Encounter (Signed)
 Reached out to patient regarding attestation letter (if received) left HIPAA compliant v/m to return call if any assistance needed.

## 2024-07-11 NOTE — Progress Notes (Signed)
 Pharmacy Medication Assistance Program Note    07/11/2024  Patient ID: Russell [name]., male   DOB: [birth year], 73 y.o.   MRN: [id number]     07/11/2024  Outreach Medication One  Initial Outreach Date (Medication One) 06/04/2024  Manufacturer Medication One [employer]  [employer] Januvia   Dose of Januvia  100 mg  Type of Assistance Manufacturer Assistance  Date Application Sent to Patient 06/04/2024  Application Items Requested Application  Date Application Sent to Prescriber 06/04/2024  Name of Prescriber Marsa Officer  Date Application Received From Patient 06/18/2024  Application Items Received From Patient Application  Date Application Received From Provider 06/05/2024  Date Application Submitted to Manufacturer 06/18/2024  Method Application Sent to Manufacturer Fax  Patient Assistance Determination Approved  Patient Notification Method Telephone Call  Telephone Call Outcome Left Voicemail   Approved on 07/07/2024 until 12/24/2024

## 2024-07-18 ENCOUNTER — Ambulatory Visit: Payer: Medicare HMO

## 2024-07-18 DIAGNOSIS — Z0000 Encounter for general adult medical examination without abnormal findings: Secondary | ICD-10-CM | POA: Diagnosis not present

## 2024-07-18 NOTE — Patient Instructions (Addendum)
 [name] , Thank you for taking time out of your busy schedule to complete your Annual Wellness Visit with me. I enjoyed our conversation and look forward to speaking with you again next year. I, as well as your care team,  appreciate your ongoing commitment to your health goals. Please review the following plan we discussed and let me know if I can assist you in the future.   Follow up Visits: Next Medicare AWV with our clinical staff:   07/31/25 @ 2:00 PM BY PHONE Have you seen your provider in the last 6 months (3 months if uncontrolled diabetes)? Yes  Clinician Recommendations:  Aim for 30 minutes of exercise or brisk walking, 6-8 glasses of water , and 5 servings of fruits and vegetables each day. TAKE CARE!      This is a list of the screening recommended for you and due dates:  Health Maintenance  Topic Date Due   DTaP/Tdap/Td vaccine (1 - Tdap) Never done   Zoster (Shingles) Vaccine (1 of 2) Never done   Eye exam for diabetics  08/25/2023   COVID-19 Vaccine (4 - 2024-25 season) 08/26/2023   Flu Shot  07/25/2024   Yearly kidney health urinalysis for diabetes  10/25/2024   Complete foot exam   10/25/2024   Hemoglobin A1C  10/25/2024   Yearly kidney function blood test for diabetes  04/24/2025   Medicare Annual Wellness Visit  07/18/2025   Cologuard (Stool DNA test)  09/09/2026   Pneumococcal Vaccine for age over 66  Completed   Hepatitis C Screening  Completed   Hepatitis B Vaccine  Aged Out   HPV Vaccine  Aged Out   Meningitis B Vaccine  Aged Out   Colon Cancer Screening  Discontinued    Advanced directives: (ACP Link)Information on Advanced Care Planning can be found at [place]  Print production planner Health Care Directives Advance Health Care Directives. [email or web address]  Advance Care Planning is important because it:  [x]  Makes sure you receive the medical care that is consistent with your values, goals, and preferences  [x]  It provides guidance to your family and loved  ones and reduces their decisional burden about whether or not they are making the right decisions based on your wishes.  Follow the link provided in your after visit summary or read over the paperwork we have mailed to you to help you started getting your Advance Directives in place. If you need assistance in completing these, please reach out to us  so that we can help you!

## 2024-07-18 NOTE — Progress Notes (Signed)
 Subjective:   Russell [name]. is a 73 y.o. who presents for a Medicare Wellness preventive visit.  As a reminder, Annual Wellness Visits don't include a physical exam, and some assessments may be limited, especially if this visit is performed virtually. We may recommend an in-person follow-up visit with your provider if needed.  Visit Complete: Virtual I connected with  Russell [name]. on 07/18/24 by a audio enabled telemedicine application and verified that I am speaking with the correct person using two identifiers.  Patient Location: Home  Provider Location: Home Office  I discussed the limitations of evaluation and management by telemedicine. The patient expressed understanding and agreed to proceed.  Vital Signs: Because this visit was a virtual/telehealth visit, some criteria may be missing or patient reported. Any vitals not documented were not able to be obtained and vitals that have been documented are patient reported.  VideoDeclined- This patient declined Librarian, academic. Therefore the visit was completed with audio only.  Persons Participating in Visit: Patient.  AWV Questionnaire: No: Patient Medicare AWV questionnaire was not completed prior to this visit.  Cardiac Risk Factors include: advanced age (>29men, >109 women);dyslipidemia;male gender;diabetes mellitus;hypertension;sedentary lifestyle     Objective:    There were no vitals filed for this visit. There is no height or weight on file to calculate BMI.     07/18/2024    1:25 PM 07/13/2023   11:03 AM 10/05/2022    1:34 PM 04/21/2022    9:47 AM 04/12/2021   11:47 AM 05/07/2019   10:38 AM  Advanced Directives  Does Patient Have a Medical Advance Directive? No No Yes Yes Yes No  Type of Advance Directive   Living will;Healthcare Power of [employer] Power of [place];Living will Healthcare Power of [place];Living will   Does patient want to make changes to medical advance  directive?    Yes (Inpatient - patient defers changing a medical advance directive and declines information at this time)    Copy of Healthcare Power of Attorney in Chart?    No - copy requested No - copy requested   Would patient like information on creating a medical advance directive? No - Patient declined No - Patient declined    No - Patient declined      Data saved with a previous flowsheet row definition    Current Medications (verified) Outpatient Encounter Medications as of 07/18/2024  Medication Sig   Alcohol  Swabs (DROPSAFE ALCOHOL  PREP) 70 % PADS Apply topically.   amiodarone  (PACERONE ) 200 MG tablet TAKE 1/2 TABLET EVERY DAY   aspirin  81 MG chewable tablet Chew by mouth daily.   BD SYRINGE SLIP TIP 25G X 5/8 1 ML MISC    Blood Glucose Monitoring Suppl (ONE TOUCH ULTRA 2) w/Device KIT Use to check blood sugar x 1 daily   brimonidine (ALPHAGAN) 0.2 % ophthalmic solution Place 1 drop into both eyes 2 (two) times daily.   carvedilol  (COREG ) 25 MG tablet Take 1 tablet (25 mg total) by mouth 2 (two) times daily.   Cholecalciferol (VITAMIN D3) 2000 units TABS Take 1 capsule by mouth daily.    dorzolamide-timolol  (COSOPT) 22.3-6.8 MG/ML ophthalmic solution Place 1 drop into both eyes 2 (two) times daily.   doxazosin  (CARDURA ) 1 MG tablet Take 1 tablet (1 mg total) by mouth 2 (two) times daily.   finasteride  (PROSCAR ) 5 MG tablet Take 1 tablet (5 mg total) by mouth daily.   furosemide  (LASIX ) 20 MG tablet Take 1 tablet (  20 mg total) by mouth daily as needed.   isosorbide  mononitrate (IMDUR ) 60 MG 24 hr tablet TAKE 1 AND 1/2 TABLETS TWICE DAILY   JANUVIA  100 MG tablet Take 1 tablet (100 mg total) by mouth daily.   Lancets (ONETOUCH DELICA PLUS LANCET33G) MISC USE TO CHECK BLOOD SUGAR UP TO 1 TIME DAILY   latanoprost  (XALATAN ) 0.005 % ophthalmic solution Place 1 drop into both eyes at bedtime.    metFORMIN  (GLUCOPHAGE -XR) 500 MG 24 hr tablet Take 2 tablets (1,000 mg total) by mouth daily  with breakfast.   nitroGLYCERIN  (NITROSTAT ) 0.4 MG SL tablet DISSOLVE ONE TABLET UNDER THE TONGUE EVERY 5 MINUTES AS NEEDED FOR CHEST PAIN.  DO NOT EXCEED A TOTAL OF 3 DOSES IN 15 MINUTES   ONETOUCH ULTRA TEST test strip TEST BLOOD SUGAR ONE TIME DAILY   pravastatin  (PRAVACHOL ) 10 MG tablet Take 1 tablet (10 mg total) by mouth at bedtime.   sacubitril -valsartan  (ENTRESTO ) 97-103 MG Take 1 tablet by mouth 2 (two) times daily.   spironolactone  (ALDACTONE ) 25 MG tablet Take 1 tablet (25 mg total) by mouth daily.   tamsulosin  (FLOMAX ) 0.4 MG CAPS capsule Take 1 capsule (0.4 mg total) by mouth daily.   No facility-administered encounter medications on file as of 07/18/2024.    Allergies (verified) Patient has no known allergies.   History: Past Medical History:  Diagnosis Date   CAD (coronary artery disease)    05/07/2019 LHC showed nonobstructive CAD   Diabetes (HCC)    Frequent PVCs    Heart failure with reduced ejection fraction (HCC)    04/2019 EF 20-25%   Hypertension    Impotence of organic origin    NICM (nonischemic cardiomyopathy) (HCC)    HFrEF in setting of NICM   Pain in joint, shoulder region    Personal history of colonic polyps    Ventricular tachycardia (HCC)    04/2019 in setting of cardiac arrest   Past Surgical History:  Procedure Laterality Date   CARDIAC CATHETERIZATION  11/17/2013   COLONOSCOPY  2009   ICD IMPLANT N/A 05/08/2019   Procedure: ICD IMPLANT;  Surgeon: Kelsie Agent, MD;  Location: [facility] INVASIVE [facility];  Service: Cardiovascular;  Laterality: N/A;   RIGHT/LEFT HEART CATH AND CORONARY ANGIOGRAPHY N/A 05/07/2019   Procedure: RIGHT/LEFT HEART CATH AND CORONARY ANGIOGRAPHY;  Surgeon: Mady Bruckner, MD;  Location: [facility] INVASIVE CV LAB;  Service: Cardiovascular;  Laterality: N/A;   Family History  Problem Relation Age of Onset   Colon cancer Father        16's   Hypertension Mother    Diabetes Mother    Social History   Socioeconomic History    Marital status: Married    Spouse name: Not on file   Number of children: Not on file   Years of education: Not on file   Highest education level: Not on file  Occupational History   Occupation: [employer]   Occupation: retired  Tobacco Use   Smoking status: Never   Smokeless tobacco: Never  Vaping Use   Vaping status: Never Used  Substance and Sexual Activity   Alcohol  use: Yes    Comment: occassional   Drug use: No   Sexual activity: Not on file  Other Topics Concern   Not on file  Social History Narrative   Not on file   Social Drivers of Health   Financial Resource Strain: Low Risk  (07/18/2024)   Overall Financial Resource Strain (CARDIA)  Difficulty of Paying Living Expenses: Not hard at all  Food Insecurity: No Food Insecurity (07/18/2024)   Hunger Vital Sign    Worried About Running Out of Food in the Last Year: Never true    Ran Out of Food in the Last Year: Never true  Transportation Needs: No Transportation Needs (07/18/2024)   PRAPARE - Administrator, Civil Service (Medical): No    Lack of Transportation (Non-Medical): No  Physical Activity: Insufficiently Active (07/18/2024)   Exercise Vital Sign    Days of Exercise per Week: 2 days    Minutes of Exercise per Session: 40 min  Stress: No Stress Concern Present (07/18/2024)   [employer] of Occupational Health - Occupational Stress Questionnaire    Feeling of Stress: Not at all  Social Connections: Moderately Integrated (07/18/2024)   Social Connection and Isolation Panel    Frequency of Communication with Friends and Family: More than three times a week    Frequency of Social Gatherings with Friends and Family: Once a week    Attends Religious Services: More than 4 times per year    Active Member of [employer] or Organizations: No    Attends Engineer, structural: Never    Marital Status: Married    Tobacco Counseling Counseling given: Not Answered    Clinical  Intake:  Pre-visit preparation completed: Yes  Pain : No/denies pain     Nutritional Risks: None Diabetes: Yes CBG done?: No Did pt. bring in CBG monitor from home?: No  Lab Results  Component Value Date   HGBA1C 8.4 (H) 04/24/2024   HGBA1C 7.9 (H) 10/19/2023   HGBA1C 7.2 (A) 04/19/2023     How often do you need to have someone help you when you read instructions, pamphlets, or other written materials from your doctor or pharmacy?: 1 - Never  Interpreter Needed?: No  Information entered by :: JHONNIE DAS, LPN   Activities of Daily Living    07/18/2024    1:26 PM 10/26/2023   10:38 AM  In your present state of health, do you have any difficulty performing the following activities:  Hearing? 0 0  Vision? 0 0  Difficulty concentrating or making decisions? 0 0  Walking or climbing stairs? 0 0  Dressing or bathing? 0 0  Doing errands, shopping? 0 0  Preparing Food and eating ? N   Using the Toilet? N   In the past six months, have you accidently leaked urine? N   Do you have problems with loss of bowel control? N   Managing your Medications? N   Managing your Finances? N   Housekeeping or managing your Housekeeping? N     Patient Care Team: Edman Marsa PARAS, DO as PCP - General (Family Medicine) Cindie Ole DASEN, MD as PCP - Electrophysiology (Cardiology) Perla Evalene PARAS, MD as PCP - Cardiology (Cardiology) Perla Evalene PARAS, MD as Consulting Physician (Cardiology) Alana Sharyle LABOR, RPH-CPP as Pharmacist Myrna, Adine Anes, MD as Consulting Physician (Ophthalmology)  I have updated your Care Teams any recent Medical Services you may have received from other providers in the past year.     Assessment:   This is a routine wellness examination for Takuya.  Hearing/Vision screen Hearing Screening - Comments:: NO AIDS Vision Screening - Comments:: WEARS GLASSES ALL DAY- DR.KING   Goals Addressed             This Visit's Progress    DIET -  REDUCE SUGAR INTAKE  Depression Screen     07/18/2024    1:23 PM 05/06/2024    9:25 AM 10/26/2023   10:38 AM 07/13/2023   11:02 AM 04/19/2023   10:01 AM 10/18/2022    9:57 AM 04/21/2022    9:46 AM  PHQ 2/9 Scores  PHQ - 2 Score 0 0 0 0 0 0 0  PHQ- 9 Score 0 0 1 0  0     Fall Risk     07/18/2024    1:26 PM 05/06/2024    9:25 AM 10/26/2023   10:38 AM 07/13/2023   11:03 AM 04/19/2023   10:01 AM  Fall Risk   Falls in the past year? 0 0 0 0 0  Number falls in past yr: 0  0 0   Injury with Fall? 0  0 0   Risk for fall due to : No Fall Risks   No Fall Risks   Follow up Falls evaluation completed;Falls prevention discussed   Falls prevention discussed;Falls evaluation completed     MEDICARE RISK AT HOME:  Medicare Risk at Home Any stairs in or around the home?: Yes If so, are there any without handrails?: No Home free of loose throw rugs in walkways, pet beds, electrical cords, etc?: Yes Adequate lighting in your home to reduce risk of falls?: Yes Life alert?: No Use of a cane, walker or w/c?: No Grab bars in the bathroom?: Yes Shower chair or bench in shower?: No Elevated toilet seat or a handicapped toilet?: Yes  TIMED UP AND GO:  Was the test performed?  No  Cognitive Function: 6CIT completed        07/18/2024    1:28 PM 07/13/2023   11:04 AM 04/12/2021   11:49 AM  6CIT Screen  What Year? 0 points 0 points 0 points  What month? 0 points 0 points 0 points  What time? 0 points 0 points 0 points  Count back from 20 0 points 0 points 0 points  Months in reverse 0 points 0 points 0 points  Repeat phrase 2 points 0 points 4 points  Total Score 2 points 0 points 4 points    Immunizations Immunization History  Administered Date(s) Administered   Fluad Quad(high Dose 65+) 10/20/2020, 09/23/2021, 10/18/2022   Fluad Trivalent(High Dose 65+) 10/02/2023   Influenza-Unspecified 09/17/2018   PFIZER(Purple Top)SARS-COV-2 Vaccination 02/04/2020, 02/25/2020, 10/12/2020    Pneumococcal Conjugate-13 08/22/2019   Pneumococcal Polysaccharide-23 08/25/2021    Screening Tests Health Maintenance  Topic Date Due   DTaP/Tdap/Td (1 - Tdap) Never done   Zoster Vaccines- Shingrix (1 of 2) Never done   OPHTHALMOLOGY EXAM  08/25/2023   COVID-19 Vaccine (4 - 2024-25 season) 08/26/2023   INFLUENZA VACCINE  07/25/2024   Diabetic kidney evaluation - Urine ACR  10/25/2024   FOOT EXAM  10/25/2024   HEMOGLOBIN A1C  10/25/2024   Diabetic kidney evaluation - eGFR measurement  04/24/2025   Medicare Annual Wellness (AWV)  07/18/2025   Fecal DNA (Cologuard)  09/09/2026   Pneumococcal Vaccine: 50+ Years  Completed   Hepatitis C Screening  Completed   Hepatitis B Vaccines  Aged Out   HPV VACCINES  Aged Out   Meningococcal B Vaccine  Aged Out   Colonoscopy  Discontinued    Health Maintenance  Health Maintenance Due  Topic Date Due   DTaP/Tdap/Td (1 - Tdap) Never done   Zoster Vaccines- Shingrix (1 of 2) Never done   OPHTHALMOLOGY EXAM  08/25/2023   COVID-19 Vaccine (4 -  2024-25 season) 08/26/2023   Health Maintenance Items Addressed: UP TO DATE ON COLOGUARD; UP TO DATE ON PNA; NEEDS SHINGRIX, COVID & TDAP; HAD EYE EXAM IN [place] AND [place]  Additional Screening:  Vision Screening: Recommended annual ophthalmology exams for early detection of glaucoma and other disorders of the eye. Would you like a referral to an eye doctor? No    Dental Screening: Recommended annual dental exams for proper oral hygiene  Community Resource Referral / Chronic Care Management: CRR required this visit?  No   CCM required this visit?  No   Plan:    I have personally reviewed and noted the following in the patient's chart:   Medical and social history Use of alcohol , tobacco or illicit drugs  Current medications and supplements including opioid prescriptions. Patient is not currently taking opioid prescriptions. Functional ability and status Nutritional status Physical  activity Advanced directives List of other physicians Hospitalizations, surgeries, and ER visits in previous 12 months Vitals Screenings to include cognitive, depression, and falls Referrals and appointments  In addition, I have reviewed and discussed with patient certain preventive protocols, quality metrics, and best practice recommendations. A written personalized care plan for preventive services as well as general preventive health recommendations were provided to patient.   Jhonnie GORMAN Das, LPN   [id number]/74/[id number]   After Visit Summary: (MyChart) Due to this being a telephonic visit, the after visit summary with patients personalized plan was offered to patient via MyChart   Notes: Nothing significant to report at this time. FAXED FOR EYE EXAMS

## 2024-07-28 ENCOUNTER — Ambulatory Visit: Attending: Cardiology

## 2024-07-28 DIAGNOSIS — Z95810 Presence of automatic (implantable) cardiac defibrillator: Secondary | ICD-10-CM

## 2024-07-28 DIAGNOSIS — I5022 Chronic systolic (congestive) heart failure: Secondary | ICD-10-CM | POA: Diagnosis not present

## 2024-07-31 ENCOUNTER — Encounter: Payer: Self-pay | Admitting: Urology

## 2024-08-01 ENCOUNTER — Telehealth: Payer: Self-pay

## 2024-08-01 NOTE — Progress Notes (Signed)
 EPIC Encounter for ICM Monitoring  Patient Name: Russell [name]. is a 73 y.o. male Date: 08/01/2024 Primary Care Physican: Edman Marsa PARAS, DO Primary Cardiologist: Perla Electrophysiologist: Cindie 07/06/2023 Weight: 159 lbs 10/12/2023 Weight: 158-159 lbs 04/03/2024 Weight: 158-159 lbs 05/09/2024 Weight: 157-158 lbs 06/13/2024 Weight: 155-158 lbs                                                          Attempted call to patient and unable to reach.  Transmission results reviewed.    Diet:  Follows a low salt diet by eating a lot of fruits and vegetables and limiting fluid intake to 64 oz daily.   He rarely eats restaurants foods.     Optivol thoracic impedance suggesting intermittent days with possible fluid accumulation starting 7/7 but at baseline since 8/2.   Prescribed: Spironolactone  25 mg take 1 tablet daily Furosemide  20 mg take 1 tablet (20 mg total) by mouth daily as needed   Labs: 04/24/2024 Creatinine 1.42, BUN 17, Potassium 4.1, Sodium 142, GFR 53 A complete set of results can be found in Results Review.   Recommendations:  Unable to reach.     Follow-up plan: ICM clinic phone appointment on 09/01/2024.   91 day device clinic remote transmission 08/04/2024.    EP/Cardiology Next Office Visit:  Recall 05/16/2025 with Dr Gollan.   Recall 10/20/2024 with Suzann Riddle, NP.     Copy of ICM check sent to Dr. Cindie.      3 month ICM trend: 07/28/2024.    12-14 Month ICM trend:     Mitzie GORMAN Garner, RN 08/01/2024 9:52 AM

## 2024-08-01 NOTE — Progress Notes (Signed)
 Spoke with patient and heart failure questions reviewed.  Transmission results reviewed.  Pt asymptomatic for fluid accumulation.  Reports feeling well at this time and voices no complaints.  Current Weight: 155 lbs.  No changes and encouraged to call if experiencing any fluid symptoms.

## 2024-08-01 NOTE — Telephone Encounter (Signed)
 Remote ICM transmission received.  Attempted call to patient regarding ICM remote transmission and no answer.

## 2024-08-04 ENCOUNTER — Ambulatory Visit: Payer: Medicare HMO

## 2024-08-04 DIAGNOSIS — I428 Other cardiomyopathies: Secondary | ICD-10-CM

## 2024-08-05 ENCOUNTER — Ambulatory Visit: Payer: Self-pay | Admitting: Cardiology

## 2024-08-05 LAB — CUP PACEART REMOTE DEVICE CHECK
Battery Remaining Longevity: 80 mo
Battery Voltage: 2.99 V
Brady Statistic RV Percent Paced: 0 %
Date Time Interrogation Session: 20250811002203
HighPow Impedance: 61 Ohm
Implantable Lead Connection Status: 753985
Implantable Lead Implant Date: 20200514
Implantable Lead Location: 753860
Implantable Lead Model: 6935
Implantable Pulse Generator Implant Date: 20200514
Lead Channel Impedance Value: 323 Ohm
Lead Channel Impedance Value: 399 Ohm
Lead Channel Pacing Threshold Amplitude: 0.5 V
Lead Channel Pacing Threshold Pulse Width: 0.4 ms
Lead Channel Sensing Intrinsic Amplitude: 9.625 mV
Lead Channel Sensing Intrinsic Amplitude: 9.625 mV
Lead Channel Setting Pacing Amplitude: 2 V
Lead Channel Setting Pacing Pulse Width: 0.4 ms
Lead Channel Setting Sensing Sensitivity: 0.3 mV
Zone Setting Status: 755011
Zone Setting Status: 755011

## 2024-08-12 ENCOUNTER — Encounter: Payer: Self-pay | Admitting: Family Medicine

## 2024-08-12 ENCOUNTER — Ambulatory Visit (INDEPENDENT_AMBULATORY_CARE_PROVIDER_SITE_OTHER): Admitting: Family Medicine

## 2024-08-12 VITALS — BP 122/70 | HR 66 | Ht 68.0 in | Wt 156.4 lb

## 2024-08-12 DIAGNOSIS — M7542 Impingement syndrome of left shoulder: Secondary | ICD-10-CM | POA: Insufficient documentation

## 2024-08-12 DIAGNOSIS — Z7984 Long term (current) use of oral hypoglycemic drugs: Secondary | ICD-10-CM

## 2024-08-12 DIAGNOSIS — E1136 Type 2 diabetes mellitus with diabetic cataract: Secondary | ICD-10-CM

## 2024-08-12 LAB — POCT GLYCOSYLATED HEMOGLOBIN (HGB A1C): Hemoglobin A1C: 6.7 % — AB (ref 4.0–5.6)

## 2024-08-12 MED ORDER — METFORMIN HCL ER 500 MG PO TB24: 1000.0000 mg | ORAL_TABLET | Freq: Every day | ORAL | 3 refills | Status: AC

## 2024-08-12 MED ORDER — PRAVASTATIN SODIUM 10 MG PO TABS: 10.0000 mg | ORAL_TABLET | Freq: Every day | ORAL | 3 refills | Status: AC

## 2024-08-12 NOTE — Patient Instructions (Addendum)
 Thank you for coming to the office today.  Recent Labs    10/19/23 0830 04/24/24 0854 08/12/24 0845  HGBA1C 7.9* 8.4* 6.7*    Likely Rotator Cuff Bursitis  START anti inflammatory topical - OTC Voltaren (generic Diclofenac) topical 2-4 times a day as needed for pain swelling of affected joint for 1-2 weeks or longer.  Let me know if still bothering you we can do X-ray, Physical Therapy then possibly injection.  Please schedule a Follow-up Appointment to: Return in about 3 months (around 11/12/2024) for 3 month fasting lab > 1 week later Annual Physical.  If you have any other questions or concerns, please feel free to call the office or send a message through MyChart. You may also schedule an earlier appointment if necessary.  Additionally, you may be receiving a survey about your experience at our office within a few days to 1 week by e-mail or mail. We value your feedback.  Marsa Officer, DO [facility], [place]

## 2024-08-12 NOTE — Progress Notes (Unsigned)
 Subjective:    Patient ID: Russell [name]., male    DOB: 12-13-1951, 73 y.o.   MRN: [id number]  Russell [name]. is a 73 y.o. male presenting on 08/12/2024 for Medical Management of Chronic Issues and Diabetes   HPI  Discussed the use of AI scribe software for clinical note transcription with the patient, who gave verbal consent to proceed.  History of Present Illness   [name]. is a 73 year old male with diabetes who presents for a follow-up on blood sugar management.  Hyperglycemia and diabetes management - Significant improvement in blood glucose control since last visit on May 13th - Hemoglobin A1c decreased from 8.4% to 6.7% - Home glucose readings average 128 mg/dL - Currently taking metformin  (two doses daily) and Januvia  100 mg (one dose daily) - Previously switched to Rybelsus  but developed nausea, leading to discontinuation and return to Januvia  - Enrolled in a patient assistance program for Januvia , receiving medication at no cost - Dietary modifications include increased intake of fruits and peanut butter  Adverse effects of antidiabetic medication - Developed nausea while taking Rybelsus , resulting in discontinuation of the medication - No current nausea on Januvia  and metformin  regimen  Shoulder pain - Pain localized to the shoulder, worsened by lifting the arm or rolling over in bed - Pain exacerbated by certain movements - History of temporary relief following intervention by a therapist - Current management includes Tylenol  and use of muscle rubs  Hyperlipidemia management - Currently taking pravastatin  for cholesterol control       CHRONIC DM, 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 No longer on Rybelsus  due to nausea side effect Meds: Januvia  100mg  daily + Metformin   500mg  x2 XR daily Reports good compliance. Tolerating well w/o side-effects Currently on ACEi He is tolerating it well. On Pravastatin  10mg  nightly, no side effects or myalgia Improved LDL on statin Has OneTouch Ultra 2  Lifestyle:  - Diet (Improving diet, less sweets, less sugar, no sodas) - Exercise (walking Followed by [facility] Dr Myrna see below, for glaucoma and cataracts Denies hypoglycemia, polyuria, visual changes, numbness or tingling.   Elevated Cr / CKD His kidney function is being monitored, with recent labs showing a creatinine level of 1.42 and a GFR of 53, a slight decline from previous values. He does not see a nephrologist.   CHRONIC HTN / Systolic CHF / Cardiomyopathy Followed by Cardiology Current Meds - Carvedilol  25mg  BID, Spironolcatone 25mg  Entrestro 97-103 BID, Isosorbide  mononitrate 60  On Amiodarone  Reports good compliance, took meds today. Tolerating well, w/o complaints. Lifestyle: Denies CP, dyspnea, HA, edema, dizziness / lightheadedness   BPH LUTS Followed by Dr Francisca [facility] [place] He continues on Alpha Blocker and Finasteride , he has had good results with improved urinary symptoms Prostate MRI has been normal. They are monitoring PSA  ***Left Shoulder Impingement   Health Maintenance: ***     07/18/2024    1:23 PM 05/06/2024    9:25 AM 10/26/2023   10:38 AM  Depression screen PHQ 2/9  Decreased Interest 0 0 0  Down, Depressed, Hopeless 0 0 0  PHQ - 2 Score 0 0 0  Altered sleeping 0 0 0  Tired, decreased energy 0 0 1  Change in appetite 0 0 0  Feeling bad or failure about yourself  0 0 0  Trouble concentrating 0 0 0  Moving slowly or fidgety/restless 0 0 0  Suicidal thoughts 0 0 0  PHQ-9 Score 0 0 1  Difficult doing work/chores Not difficult at all Not difficult at all Not difficult at all       05/06/2024    9:25 AM 10/26/2023   10:38 AM 04/19/2023   10:01 AM 10/18/2022    9:57 AM  GAD 7 : Generalized Anxiety  Score  Nervous, Anxious, on Edge 0 0 0 0  Control/stop worrying 0 0 0 0  Worry too much - different things 0 0 1 0  Trouble relaxing 0 0 0 0  Restless 0 0 0 0  Easily annoyed or irritable 0 0 0 0  Afraid - awful might happen 0 0 0 0  Total GAD 7 Score 0 0 1 0  Anxiety Difficulty Not difficult at all   Not difficult at all    Social History   Tobacco Use   Smoking status: Never   Smokeless tobacco: Never  Vaping Use   Vaping status: Never Used  Substance Use Topics   Alcohol  use: Yes    Comment: occassional   Drug use: No    Review of Systems Per HPI unless specifically indicated above     Objective:    BP 122/70 (BP Location: Right Arm, Patient Position: Sitting, Cuff Size: Normal)   Pulse 66   Ht 5' 8 (1.727 m)   Wt 156 lb 6 oz (70.9 kg)   SpO2 98%   BMI 23.78 kg/m   Wt Readings from Last 3 Encounters:  08/12/24 156 lb 6 oz (70.9 kg)  05/16/24 157 lb 8 oz (71.4 kg)  05/06/24 157 lb (71.2 kg)    Physical Exam Vitals and nursing note reviewed.  Constitutional:      General: He is not in acute distress.    Appearance: He is well-developed. He is not diaphoretic.     Comments: Well-appearing, comfortable, cooperative  HENT:     Head: Normocephalic and atraumatic.  Eyes:     General:        Right eye: No discharge.        Left eye: No discharge.     Conjunctiva/sclera: Conjunctivae normal.  Neck:     Thyroid : No thyromegaly.  Cardiovascular:     Rate and Rhythm: Normal rate and regular rhythm.     Pulses: Normal pulses.     Heart sounds: Normal heart sounds. No murmur heard. Pulmonary:     Effort: Pulmonary effort is normal. No respiratory distress.     Breath sounds: Normal breath sounds. No wheezing or rales.  Musculoskeletal:        General: Normal range of motion.     Cervical back: Normal range of motion and neck supple.  Lymphadenopathy:     Cervical: No cervical adenopathy.  Skin:    General: Skin is warm and dry.     Findings: No erythema  or rash.  Neurological:     Mental Status: He is alert and oriented to person, place, and time. Mental status is at baseline.  Psychiatric:        Behavior: Behavior normal.     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 Hemoglobin A1c decreased from 8.4% to 6.7%. Current regimen of metformin  and Januvia  well-tolerated. - Continue metformin  and Januvia . - Encourage dietary modifications, including increased fruit intake. - Monitor blood glucose levels regularly. - Schedule follow-up in three months for annual review and blood work.  Left shoulder pain likely due to rotator cuff pathology and impingement syndrome Chronic pain likely due to rotator cuff pathology and impingement syndrome. Pain exacerbated by lifting and certain movements. Discussed treatment options including anti-inflammatory medications, physical therapy, and corticosteroid injections if needed. - Recommend topical Voltaren for pain management. - Advise on range of motion exercises. - Consider x-ray and physical therapy if symptoms persist. - Discuss potential for corticosteroid injection if  conservative measures fail.  Hyperlipidemia Managed with pravastatin . - Continue pravastatin  as prescribed.  General Health Maintenance Discussion on flu vaccination timing. - Administer flu shot in October.        Orders Placed This Encounter  Procedures   POCT HgB A1C    Meds ordered this encounter  Medications   metFORMIN  (GLUCOPHAGE -XR) 500 MG 24 hr tablet    Sig: Take 2 tablets (1,000 mg total) by mouth daily with breakfast.    Dispense:  180 tablet    Refill:  3   pravastatin  (PRAVACHOL ) 10 MG tablet    Sig: Take 1 tablet (10 mg total) by mouth at bedtime.    Dispense:  90 tablet    Refill:  3    Follow up plan: Return in about 3 months (around 11/12/2024) for 3 month fasting lab > 1 week later Annual Physical.  Future labs ordered for ***   Marsa Officer, DO [facility] 08/12/2024, 8:56 AM

## 2024-08-13 ENCOUNTER — Other Ambulatory Visit: Payer: Self-pay | Admitting: Family Medicine

## 2024-08-13 DIAGNOSIS — E1136 Type 2 diabetes mellitus with diabetic cataract: Secondary | ICD-10-CM

## 2024-08-13 DIAGNOSIS — D649 Anemia, unspecified: Secondary | ICD-10-CM

## 2024-08-13 DIAGNOSIS — I5022 Chronic systolic (congestive) heart failure: Secondary | ICD-10-CM

## 2024-08-13 DIAGNOSIS — R7989 Other specified abnormal findings of blood chemistry: Secondary | ICD-10-CM

## 2024-08-13 DIAGNOSIS — I10 Essential (primary) hypertension: Secondary | ICD-10-CM

## 2024-08-13 DIAGNOSIS — E1169 Type 2 diabetes mellitus with other specified complication: Secondary | ICD-10-CM

## 2024-08-13 DIAGNOSIS — Z0000 Encounter for general adult medical examination without abnormal findings: Secondary | ICD-10-CM

## 2024-08-18 ENCOUNTER — Other Ambulatory Visit: Admitting: Pharmacist

## 2024-08-18 DIAGNOSIS — I5022 Chronic systolic (congestive) heart failure: Secondary | ICD-10-CM

## 2024-08-18 DIAGNOSIS — I10 Essential (primary) hypertension: Secondary | ICD-10-CM

## 2024-08-18 DIAGNOSIS — E1136 Type 2 diabetes mellitus with diabetic cataract: Secondary | ICD-10-CM

## 2024-08-18 DIAGNOSIS — Z7984 Long term (current) use of oral hypoglycemic drugs: Secondary | ICD-10-CM

## 2024-08-18 NOTE — Progress Notes (Signed)
 08/18/2024 Name: Russell [name]. MRN: [id number] DOB: 01-10-1951  Chief Complaint  Patient presents with   Medication Management    Russell Hu. is a 73 y.o. year old male who presented for a telephone visit.   They were referred to the pharmacist by PCP for assistance in managing diabetes, hypertension, medication access, and complex medication management.    Subjective:  Care Team: Primary Care Provider: Edman Russell PARAS, DO ; Next Scheduled Visit: 11/18/2024 Cardiologist: Russell Evalene PARAS, MD Urology: Russell Redell BROCKS, MD  Medication Access/Adherence  Current Pharmacy:  [facility] - [place], [place] - [address] [place] [address] Phone: [phone] Fax: [phone]  [facility] - [place], [place] - [address] [place] [address] Phone: [phone] Fax: [phone]   Patient reports affordability concerns with their medications: No  Patient reports access/transportation concerns to their pharmacy: No  Patient reports adherence concerns with their medications:  No       Diabetes:   Current medications:  - metformin  ER 500 mg - 2 tablets daily - Januvia  100 mg daily    Previous therapies tried: Jardiance  10 mg daily (hypotension; urinary symptoms); was unable to tolerate dose of metformin  ER >1000 mg/day; Rybelsus  (GI upset/no appetite)   Recent morning fasting reading ranging: 124-130   Patient denies hypoglycemic s/sx including dizziness, shakiness, sweating.     Statin: pravastatin  10 mg daily   Current physical activity: [place] at the mall ~40 minutes x 2-3 times/week   Current medication access support: enrolled in patient assistance for Januvia  from [employer] through 12/24/2024 Has ~ 2 month supply of Januvia  remaining     Hypertension/Systolic CHF/Cardiomyopathy:   Patient followed by [facility] [place]   Current medications:  - carvedilol  25 mg twice  daily - isosorbide  ER 60 mg - 1.5 tablets (90 mg) twice daily - Entresto  97/103 twice daily - spironolactone  25 mg daily - doxazosin  1 mg twice daily  - furosemide  20 daily as needed for any ankle swelling or abdominal distention, weight gain   Previous therapies tried: Jardiance  10 mg daily (hypotension; urinary symptoms)   Patient has an automated, upper arm home BP cuff (>3 years old and he wonders if needs new monitor) Recent blood pressure readings  8/23: 118/61, HR 67     Denies symptoms of hypotension such as dizziness or lightheadedness as long as takes positional changes slowly and stays hydrated   Confirms weighs himself daily as recommended by Cardiologist; weight staying ~154 lbs - Denies s/sx of swelling at this time    Current medication access support:  - Entresto  patient assistance from [employer] (paperwork completed by Cardiology)   Current physical activity: Walking at the mall ~40 minutes x 2-3 times/week     Objective:  Lab Results  Component Value Date   HGBA1C 6.7 (A) 08/12/2024    Lab Results  Component Value Date   CREATININE 1.42 (H) 04/24/2024   BUN 17 04/24/2024   NA 142 04/24/2024   K 4.1 04/24/2024   CL 109 04/24/2024   CO2 26 04/24/2024    Lab Results  Component Value Date   CHOL 140 10/19/2023   HDL 56 10/19/2023   LDLCALC 68 10/19/2023   TRIG 82 10/19/2023   CHOLHDL 2.5 10/19/2023   BP Readings from Last 3 Encounters:  08/12/24 122/70  05/16/24 108/66  05/06/24 130/70   Pulse Readings from Last 3 Encounters:  08/12/24 66  05/16/24 71  05/06/24 68     Medications Reviewed Today     Reviewed by Russell [name], RPH-CPP (Pharmacist) on 08/18/24 at 0845  Med List Status: <None>   Medication Order Taking? Sig Documenting Provider Last Dose Status Informant  Alcohol  Swabs (DROPSAFE ALCOHOL  PREP) 70 % PADS [id number]  Apply topically. [provider]  Active   amiodarone  (PACERONE ) 200 MG tablet [id number]  TAKE  1/2 TABLET EVERY DAY Riddle, Suzann, NP  Active   aspirin  81 MG chewable tablet [id number]  Chew by mouth daily. [provider]  Active   BD SYRINGE SLIP TIP 25G X 5/8 1 ML MISC [id number]   [provider]  Active   Blood Glucose Monitoring Suppl (ONE TOUCH ULTRA 2) w/Device KIT [id number]  Use to check blood sugar x 1 daily Karamalegos, Russell PARAS, DO  Active   brimonidine (ALPHAGAN) 0.2 % ophthalmic solution [id number]  Place 1 drop into both eyes 2 (two) times daily. [provider]  Active   carvedilol  (COREG ) 25 MG tablet [id number] Yes Take 1 tablet (25 mg total) by mouth 2 (two) times daily. Gollan, Timothy J, MD  Active   Cholecalciferol (VITAMIN D3) 2000 units TABS [id number]  Take 1 capsule by mouth daily.  [provider]  Active Spouse/Significant Other  dorzolamide-timolol  (COSOPT) 22.3-6.8 MG/ML ophthalmic solution [id number]  Place 1 drop into both eyes 2 (two) times daily. [provider]  Active   doxazosin  (CARDURA ) 1 MG tablet [id number] Yes Take 1 tablet (1 mg total) by mouth 2 (two) times daily. Gollan, Timothy J, MD  Active   finasteride  (PROSCAR ) 5 MG tablet [id number]  Take 1 tablet (5 mg total) by mouth daily. Russell Redell BROCKS, MD  Active   furosemide  (LASIX ) 20 MG tablet [id number] Yes Take 1 tablet (20 mg total) by mouth daily as needed. Gollan, Timothy J, MD  Active   isosorbide  mononitrate (IMDUR ) 60 MG 24 hr tablet [id number] Yes TAKE 1 AND 1/2 TABLETS TWICE DAILY Gollan, Timothy J, MD  Active   JANUVIA  100 MG tablet [id number] Yes Take 1 tablet (100 mg total) by mouth daily. Russell Russell PARAS, DO  Active   Lancets [facility] DELICA PLUS [place]) MISC [id number]  USE TO CHECK BLOOD SUGAR UP TO 1 TIME DAILY Russell Russell PARAS, DO  Active   latanoprost  (XALATAN ) 0.005 % ophthalmic solution [id number]  Place 1 drop into both eyes at bedtime.  [provider]  Active Spouse/Significant Other  metFORMIN  (GLUCOPHAGE -XR)  500 MG 24 hr tablet [id number] Yes Take 2 tablets (1,000 mg total) by mouth daily with breakfast. Russell Russell PARAS, DO  Active   nitroGLYCERIN  (NITROSTAT ) 0.4 MG SL tablet [id number]  DISSOLVE ONE TABLET UNDER THE TONGUE EVERY 5 MINUTES AS NEEDED FOR CHEST PAIN.  DO NOT EXCEED A TOTAL OF 3 DOSES IN 15 MINUTES Gollan, Timothy J, MD  Active   [facility] ULTRA TEST test strip [id number]  TEST BLOOD SUGAR ONE TIME DAILY Russell Russell PARAS, DO  Active   pravastatin  (PRAVACHOL ) 10 MG tablet [id number] Yes Take 1 tablet (10 mg total) by mouth at bedtime. Russell Russell PARAS, DO  Active   sacubitril -valsartan  (ENTRESTO ) 97-103 MG [id number] Yes Take 1 tablet by mouth 2 (two) times daily. Gollan, Timothy J, MD  Active   spironolactone  (ALDACTONE ) 25 MG tablet [id number] Yes Take 1 tablet (25 mg total) by mouth daily. Gollan, Timothy J, MD  Active   tamsulosin  (FLOMAX ) 0.4 MG CAPS capsule [id number]  Take 1 capsule (0.4 mg total) by mouth daily. Russell Redell BROCKS, MD  Active               Assessment/Plan:   Diabetes: - Reviewed long term cardiovascular and renal outcomes of uncontrolled blood sugar - Reviewed goal A1c, goal fasting, and goal 2 hour post prandial glucose - Reviewed dietary modifications including importance of having regular well-balanced meals throughout the day, while controlling carbohydrate portion sizes - Recommend to check glucose, keep log of results and have this record to review at upcoming medical appointments. Patient to contact provider office sooner if needed for readings outside of established parameters or symptoms             Encouraged patient to use glucose checks as feedback on dietary choices - Patient to contact [employer] patient assistance as needed for refills of Januvia    Hypertension/Systolic CHF/Cardiomyopathy: - Recommended to continue to check home blood pressure and heart rate as well as daily weight, keep log of results and have record to review  during future appointments Patient to contact Cardiology or PCP office if noting readings outside of established parameters or if having any symptoms, including any dizziness or lightheadedness Encourage patient to bring current home monitor with him to next office visit - Encourage patient to continue to take positional changes slowly - Have counseled patient on importance of taking his blood pressure medications before coming to his medical appointments    Health Maintenance - Again discussed CDC/ACIP recommendations for Tetanus and Shingrix vaccine. Recommended to follow up with pharmacy to obtain vaccinations       Follow Up Plan: Clinical Pharmacist will follow up with patient by telephone on 10/06/2024 at 10:00 AM    Sharyle Sia, PharmD, JAQUELINE, CPP Clinical Pharmacist [facility] [phone]

## 2024-08-18 NOTE — Patient Instructions (Signed)
 Goals Addressed             This Visit's Progress    Pharmacy Goals       If you need to reach out to patient assistance programs regarding refills or to find out the status of your application, you can do so by calling:  [employer] [phone]   Our goal A1c is less than 7%. This corresponds with fasting sugars less than 130 and 2 hour after meal sugars less than 180. Please keep a log of your results when checking your blood sugar    Our blood pressure goal is less than 130/80.  To appropriately check your blood pressure, make sure you do the following:  1) Avoid caffeine, exercise, or tobacco products for 30 minutes before checking. Empty your bladder. 2) Sit with your back supported in a flat-backed chair. Rest your arm on something flat (arm of the chair, table, etc). 3) Sit still with your feet flat on the floor, resting, for at least 5 minutes.  4) Check your blood pressure. Take 1-2 readings.  5) Write down these readings and bring with you to any provider appointments.  Bring your home blood pressure machine with you to a provider's office for accuracy comparison at least once a year.   Make sure you take your blood pressure medications before you come to any office visit, even if you were asked to fast for labs.  Our goal bad cholesterol, or LDL, is less than 70 . This is why it is important to continue taking your pravastatin .  Thank you!    Sharyle Sia, PharmD, JAQUELINE, CPP Clinical Pharmacist [facility] [phone]

## 2024-08-27 DIAGNOSIS — H401121 Primary open-angle glaucoma, left eye, mild stage: Secondary | ICD-10-CM | POA: Diagnosis not present

## 2024-08-27 DIAGNOSIS — H401113 Primary open-angle glaucoma, right eye, severe stage: Secondary | ICD-10-CM | POA: Diagnosis not present

## 2024-09-01 ENCOUNTER — Ambulatory Visit: Attending: Cardiology

## 2024-09-01 DIAGNOSIS — Z95810 Presence of automatic (implantable) cardiac defibrillator: Secondary | ICD-10-CM | POA: Diagnosis not present

## 2024-09-01 DIAGNOSIS — I5022 Chronic systolic (congestive) heart failure: Secondary | ICD-10-CM | POA: Diagnosis not present

## 2024-09-01 NOTE — Progress Notes (Signed)
 EPIC Encounter for ICM Monitoring  Patient Name: Russell [name]. is a 73 y.o. male Date: 09/01/2024 Primary Care Physican: Edman Marsa PARAS, DO Primary Cardiologist: Perla Electrophysiologist: Cindie 07/06/2023 Weight: 159 lbs 10/12/2023 Weight: 158-159 lbs 04/03/2024 Weight: 158-159 lbs 05/09/2024 Weight: 157-158 lbs 06/13/2024 Weight: 155-158 lbs 08/01/2024 Weight: 155 lbs 09/01/2024 Weight: 154 lbs                                                           Spoke with patient and heart failure questions reviewed.  Transmission results reviewed.  Pt asymptomatic for fluid accumulation.  Reports feeling well at this time and voices no complaints.     Diet:  Follows a low salt diet by eating a lot of fruits and vegetables and limiting fluid intake to 64 oz daily.   He rarely eats restaurants foods.     Optivol thoracic impedance suggesting possible fluid accumulation starting 9/2 but trending close to baseline.   Prescribed: Spironolactone  25 mg take 1 tablet daily Furosemide  20 mg take 1 tablet (20 mg total) by mouth daily as needed   Labs: 04/24/2024 Creatinine 1.42, BUN 17, Potassium 4.1, Sodium 142, GFR 53 A complete set of results can be found in Results Review.   Recommendations: No changes and encouraged to call if experiencing any fluid symptoms.   Follow-up plan: ICM clinic phone appointment on 10/06/2024.   91 day device clinic remote transmission 11/03/2024.    EP/Cardiology Next Office Visit:  Recall 05/16/2025 with Dr Gollan.   Recall 10/20/2024 with Suzann Riddle, NP.     Copy of ICM check sent to Dr. Cindie.    3 month ICM trend: 09/01/2024.    12-14 Month ICM trend:     Mitzie GORMAN Garner, RN 09/01/2024 3:05 PM

## 2024-09-03 DIAGNOSIS — H401113 Primary open-angle glaucoma, right eye, severe stage: Secondary | ICD-10-CM | POA: Diagnosis not present

## 2024-09-03 DIAGNOSIS — E119 Type 2 diabetes mellitus without complications: Secondary | ICD-10-CM | POA: Diagnosis not present

## 2024-09-03 DIAGNOSIS — H401121 Primary open-angle glaucoma, left eye, mild stage: Secondary | ICD-10-CM | POA: Diagnosis not present

## 2024-09-03 DIAGNOSIS — H2513 Age-related nuclear cataract, bilateral: Secondary | ICD-10-CM | POA: Diagnosis not present

## 2024-09-03 LAB — HM DIABETES EYE EXAM

## 2024-09-17 ENCOUNTER — Other Ambulatory Visit: Payer: Self-pay | Admitting: Family Medicine

## 2024-09-17 DIAGNOSIS — E1136 Type 2 diabetes mellitus with diabetic cataract: Secondary | ICD-10-CM

## 2024-09-18 NOTE — Telephone Encounter (Signed)
 Requested Prescriptions  Pending Prescriptions Disp Refills   ONETOUCH ULTRA TEST test strip [Pharmacy Med Name: ONETOUCH ULTRA In Vitro Strip] 100 strip 0    Sig: TEST BLOOD SUGAR ONE TIME DAILY     There is no refill protocol information for this order     Lancets (ONETOUCH DELICA PLUS LANCET33G) MISC [Pharmacy Med Name: ONETOUCH DELICA PLUS LANCETS EXTRA FINE 33G] 100 each 0    Sig: USE TO CHECK BLOOD SUGAR UP TO 1 TIME DAILY     Endocrinology: Diabetes - Testing Supplies Passed - 09/18/2024  1:25 PM      Passed - Valid encounter within last 12 months    Recent Outpatient Visits           1 month ago 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 [facility])   [place] [facility], Marsa PARAS, DO   4 months ago 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 [facility])    [facility], Marsa PARAS, DO       Future Appointments             In 4 months Francisca, Redell BROCKS, MD [facility] [place]

## 2024-09-19 NOTE — Progress Notes (Signed)
Remote ICD Transmission.

## 2024-09-25 ENCOUNTER — Ambulatory Visit (INDEPENDENT_AMBULATORY_CARE_PROVIDER_SITE_OTHER)

## 2024-09-25 DIAGNOSIS — Z23 Encounter for immunization: Secondary | ICD-10-CM | POA: Diagnosis not present

## 2024-09-26 NOTE — Progress Notes (Signed)
 Russell [name].                                          MRN: [id number]   09/26/2024   The VBCI Quality Team Specialist reviewed this patient medical record for the purposes of chart review for care gap closure. The following were reviewed: chart review for care gap closure-kidney health evaluation for diabetes:eGFR  and uACR.    VBCI Quality Team

## 2024-09-26 NOTE — Progress Notes (Signed)
 [name].                                          MRN: [id number]   09/26/2024   The VBCI Quality Team Specialist reviewed this patient medical record for the purposes of chart review for care gap closure. The following were reviewed: chart review for care gap closure-kidney health evaluation for diabetes:eGFR  and uACR.    VBCI Quality Team

## 2024-10-06 ENCOUNTER — Telehealth: Payer: Self-pay

## 2024-10-06 ENCOUNTER — Encounter: Payer: Self-pay | Admitting: Pharmacist

## 2024-10-06 ENCOUNTER — Other Ambulatory Visit (INDEPENDENT_AMBULATORY_CARE_PROVIDER_SITE_OTHER): Admitting: Pharmacist

## 2024-10-06 ENCOUNTER — Encounter

## 2024-10-06 DIAGNOSIS — I5022 Chronic systolic (congestive) heart failure: Secondary | ICD-10-CM

## 2024-10-06 DIAGNOSIS — I10 Essential (primary) hypertension: Secondary | ICD-10-CM

## 2024-10-06 DIAGNOSIS — Z7984 Long term (current) use of oral hypoglycemic drugs: Secondary | ICD-10-CM

## 2024-10-06 DIAGNOSIS — E1136 Type 2 diabetes mellitus with diabetic cataract: Secondary | ICD-10-CM

## 2024-10-06 NOTE — Patient Instructions (Addendum)
 Goals Addressed             This Visit's Progress    Pharmacy Goals       If you need to reach out to patient assistance programs regarding refills or to find out the status of your application, you can do so by calling:  [employer] [phone]   Our goal A1c is less than 7%. This corresponds with fasting sugars less than 130 and 2 hour after meal sugars less than 180. Please keep a log of your results when checking your blood sugar    Our blood pressure goal is less than 130/80.  To appropriately check your blood pressure, make sure you do the following:  1) Avoid caffeine, exercise, or tobacco products for 30 minutes before checking. Empty your bladder. 2) Sit with your back supported in a flat-backed chair. Rest your arm on something flat (arm of the chair, table, etc). 3) Sit still with your feet flat on the floor, resting, for at least 5 minutes.  4) Check your blood pressure. Take 1-2 readings.  5) Write down these readings and bring with you to any provider appointments.  Bring your home blood pressure machine with you to a provider's office for accuracy comparison at least once a year.   Make sure you take your blood pressure medications before you come to any office visit, even if you were asked to fast for labs.  Our goal bad cholesterol, or LDL, is less than 70 . This is why it is important to continue taking your pravastatin .  Thank you!   Sharyle Sia, PharmD, JAQUELINE, CPP Clinical Pharmacist [facility] [phone]             You were pre-approved for the [facility] Cardiomyopathy - Medicare Access grant.    You can contact [employer] at:   [email or web address] [phone]   [place] Information:   Enrollment Period: 09/06/2024 through 09/06/2024 [place] Amount: $7500.00 [employer] Identification Number: [id number]   Pharmacy Billing Info:   ID: [id number] BIN: [id number] PCN:  [id number] Group: [id number] Pharmacy Help Desk: [phone]   Please reach out to [facility] for further questions about this grant. You can give me or your provider a call for further questions about your medications.

## 2024-10-06 NOTE — Progress Notes (Addendum)
 10/06/2024 Name: Russell [name]. MRN: [id number] DOB: 1951/05/16  Chief Complaint  Patient presents with   Medication Management   Medication Assistance    Russell [name]. is a 73 y.o. year old male who presented for a telephone visit.   They were referred to the pharmacist by PCP for assistance in managing diabetes, hypertension, medication access, and complex medication management.    Subjective:   Care Team: Primary Care Provider: Edman Marsa PARAS, DO ; Next Scheduled Visit: 11/18/2024 Cardiologist: Perla Evalene PARAS, MD Urology: Francisca Redell BROCKS, MD  Medication Access/Adherence  Current Pharmacy:  [facility] - [place], [place] - [address] [place] [address] Phone: [phone] Fax: [phone]  [facility] - [place], [place] - [address] [place] [address] Phone: [phone] Fax: [phone]   Patient reports affordability concerns with their medications: No  Patient reports access/transportation concerns to their pharmacy: No  Patient reports adherence concerns with their medications:  No       Diabetes:   Current medications:  - metformin  ER 500 mg - 2 tablets daily - Januvia  100 mg daily    Previous therapies tried: Jardiance  10 mg daily (hypotension; urinary symptoms); was unable to tolerate dose of metformin  ER >1000 mg/day; Rybelsus  (GI upset/no appetite)   Recent morning fasting reading ranging: 114-144   Patient denies hypoglycemic s/sx including dizziness, shakiness, sweating.     Statin: pravastatin  10 mg daily   Current physical activity: Walking at the mall or outside ~45 minutes x 2 times/week   Current medication access support: enrolled in patient assistance for Januvia  from [employer] through 12/24/2024      Hypertension/Systolic CHF/Cardiomyopathy:   Patient followed by [facility]   Current medications:  - carvedilol  25 mg twice daily -  isosorbide  ER 60 mg - 1.5 tablets (90 mg) twice daily - Entresto  97/103 twice daily - spironolactone  25 mg daily - doxazosin  1 mg twice daily  - furosemide  20 daily as needed for any ankle swelling or abdominal distention, weight gain   Previous therapies tried: Jardiance  10 mg daily (hypotension; urinary symptoms)   Patient has an automated, upper arm home BP cuff (>79 years old and he wonders if needs new monitor) Recent blood pressure readings  - Today: 111/64, HR 72 - 10/11: 101/64, HR 74  - 10/10: 113/70, HR 66    Denies symptoms of hypotension such as dizziness or lightheadedness   Confirms weighs himself daily as recommended by Cardiologist; weight staying ~155 lbs - Denies s/sx of swelling at this time    Current medication access support:  - Entresto  patient assistance from [employer] through 12/24/2024, but has recently been informed that this program will no longer be available in 2026 calendar year   Current physical activity: Walking at the mall or outside ~45 minutes x 2 times/week     Objective:  Lab Results  Component Value Date   HGBA1C 6.7 (A) 08/12/2024    Lab Results  Component Value Date   CREATININE 1.42 (H) 04/24/2024   BUN 17 04/24/2024   NA 142 04/24/2024   K 4.1 04/24/2024   CL 109 04/24/2024   CO2 26 04/24/2024    Lab Results  Component Value Date   CHOL 140 10/19/2023   HDL 56 10/19/2023   LDLCALC 68 10/19/2023   TRIG 82 10/19/2023   CHOLHDL 2.5 10/19/2023   BP Readings from Last 3  Encounters:  08/12/24 122/70  05/16/24 108/66  05/06/24 130/70   Pulse Readings from Last 3 Encounters:  08/12/24 66  05/16/24 71  05/06/24 68     Medications Reviewed Today     Reviewed by Alana Sharyle LABOR, RPH-CPP (Pharmacist) on 10/06/24 at 1636  Med List Status: <None>   Medication Order Taking? Sig Documenting Provider Last Dose Status Informant  Alcohol  Swabs (DROPSAFE ALCOHOL  PREP) 70 % PADS [id number]  Apply topically. [provider]  Active   amiodarone  (PACERONE ) 200 MG tablet [id number]  TAKE 1/2 TABLET EVERY DAY Riddle, Suzann, NP  Active   aspirin  81 MG chewable tablet [id number]  Chew by mouth daily. [provider]  Active   BD SYRINGE SLIP TIP 25G X 5/8 1 ML MISC [id number]   [provider]  Active   Blood Glucose Monitoring Suppl (ONE TOUCH ULTRA 2) w/Device KIT [id number]  Use to check blood sugar x 1 daily Karamalegos, Marsa PARAS, DO  Active   brimonidine (ALPHAGAN) 0.2 % ophthalmic solution [id number]  Place 1 drop into both eyes 2 (two) times daily. [provider]  Active   carvedilol  (COREG ) 25 MG tablet [id number] Yes Take 1 tablet (25 mg total) by mouth 2 (two) times daily. Gollan, Timothy J, MD  Active   Cholecalciferol (VITAMIN D3) 2000 units TABS [id number]  Take 1 capsule by mouth daily.  [provider]  Active Spouse/Significant Other  dorzolamide-timolol  (COSOPT) 22.3-6.8 MG/ML ophthalmic solution [id number]  Place 1 drop into both eyes 2 (two) times daily. [provider]  Active   doxazosin  (CARDURA ) 1 MG tablet [id number] Yes Take 1 tablet (1 mg total) by mouth 2 (two) times daily. Gollan, Timothy J, MD  Active   finasteride  (PROSCAR ) 5 MG tablet [id number]  Take 1 tablet (5 mg total) by mouth daily. Francisca Redell BROCKS, MD  Active   furosemide  (LASIX ) 20 MG tablet [id number]  Take 1 tablet (20 mg total) by mouth daily as needed. Gollan, Timothy J, MD  Active   isosorbide  mononitrate (IMDUR ) 60 MG 24 hr tablet [id number] Yes TAKE 1 AND 1/2 TABLETS TWICE DAILY Gollan, Timothy J, MD  Active   JANUVIA  100 MG tablet [id number] Yes Take 1 tablet (100 mg total) by mouth daily. Edman Marsa PARAS, DO  Active   Lancets [facility] DELICA PLUS [place]) MISC [id number]  USE TO CHECK BLOOD SUGAR UP TO 1 TIME DAILY Edman Marsa PARAS, DO  Active   latanoprost  (XALATAN ) 0.005 % ophthalmic solution [id number]  Place 1 drop into both eyes at bedtime.  [provider]  Active Spouse/Significant Other  metFORMIN  (GLUCOPHAGE -XR) 500 MG 24 hr tablet [id number] Yes Take 2 tablets (1,000 mg total) by mouth daily with breakfast. Edman Marsa PARAS, DO  Active   nitroGLYCERIN  (NITROSTAT ) 0.4 MG SL tablet [id number]  DISSOLVE ONE TABLET UNDER THE TONGUE EVERY 5 MINUTES AS NEEDED FOR CHEST PAIN.  DO NOT EXCEED A TOTAL OF 3 DOSES IN 15 MINUTES Gollan, Timothy J, MD  Active   [facility] ULTRA TEST test strip [id number]  TEST BLOOD SUGAR ONE TIME DAILY Edman Marsa PARAS, DO  Active   pravastatin  (PRAVACHOL ) 10 MG tablet [id number] Yes Take 1 tablet (10 mg total) by mouth at bedtime. Edman Marsa PARAS, DO  Active   sacubitril -valsartan  (ENTRESTO ) 97-103 MG [id number] Yes Take 1 tablet by mouth 2 (two) times daily. Gollan, Timothy J, MD  Active   spironolactone  (ALDACTONE ) 25 MG tablet [id number] Yes Take 1 tablet (25 mg  total) by mouth daily. Gollan, Timothy J, MD  Active   tamsulosin  (FLOMAX ) 0.4 MG CAPS capsule [id number]  Take 1 capsule (0.4 mg total) by mouth daily. Francisca Redell BROCKS, MD  Active               Assessment/Plan:   Diabetes: - Reviewed long term cardiovascular and renal outcomes of uncontrolled blood sugar - Reviewed goal A1c, goal fasting, and goal 2 hour post prandial glucose - Reviewed dietary modifications including importance of having regular well-balanced meals throughout the day, while controlling carbohydrate portion sizes - Recommend to check glucose, keep log of results and have this record to review at upcoming medical appointments. Patient to contact provider office sooner if needed for readings outside of established parameters or symptoms             Encouraged patient to use glucose checks as feedback on dietary choices - Patient to contact [employer] patient assistance as needed for refills of Januvia  - Will ask CPhT [place] to support patient with applying for re-enrollment in [employer] patient assistance for  Januvia    Hypertension/Systolic CHF/Cardiomyopathy: - Recommended to continue to check home blood pressure and heart rate as well as daily weight, keep log of results and have record to review during future appointments Patient to contact Cardiology or PCP office if noting readings outside of established parameters or if having any symptoms, including any dizziness or lightheadedness Encourage patient to bring current home monitor with him to next office visit - Have encouraged patient to continue to take positional changes slowly - Have counseled patient on importance of taking his blood pressure medications before coming to his medical appointments  - Today assist patient with applying for the [facility] Cardiomyopathy - Medicare [employer]. Patient is approved through 09/05/2025  ID: [id number] BIN: [id number] PCN: [id number] Group: [id number] Pharmacy Help Desk: [phone]     Follow Up Plan: Clinical Pharmacist will follow up with patient by telephone on 11/24/2024 at 10:00 AM    Sharyle Sia, PharmD, JAQUELINE, CPP Clinical Pharmacist [facility] [phone]

## 2024-10-06 NOTE — Telephone Encounter (Signed)
 Spoke with patient.  Requested to send manual ICM remote transmission but unable to send report.  Provided [employer] number and he will call to get assistance.

## 2024-10-08 NOTE — Telephone Encounter (Signed)
 No ICM remote transmission received for 10/06/2024 and rescheduled for 10/29/2024.

## 2024-10-10 ENCOUNTER — Ambulatory Visit: Attending: Cardiology

## 2024-10-10 DIAGNOSIS — Z95810 Presence of automatic (implantable) cardiac defibrillator: Secondary | ICD-10-CM

## 2024-10-10 DIAGNOSIS — I5022 Chronic systolic (congestive) heart failure: Secondary | ICD-10-CM | POA: Diagnosis not present

## 2024-10-10 NOTE — Progress Notes (Signed)
 EPIC Encounter for ICM Monitoring  Patient Name: Russell [name]. is a 73 y.o. male Date: 10/10/2024 Primary Care Physican: Edman Marsa PARAS, DO Primary Cardiologist: Perla Electrophysiologist: Cindie 07/06/2023 Weight: 159 lbs 10/12/2023 Weight: 158-159 lbs 04/03/2024 Weight: 158-159 lbs 05/09/2024 Weight: 157-158 lbs 06/13/2024 Weight: 155-158 lbs 08/01/2024 Weight: 155 lbs 09/01/2024 Weight: 154 lbs  10/10/2024 Weight: 154 lbs                                                          Spoke with patient and heart failure questions reviewed.  Transmission results reviewed.  Pt asymptomatic for fluid accumulation.  Reports feeling well at this time and voices no complaints.     Diet:  Follows a low salt diet by eating a lot of fruits and vegetables and limiting fluid intake to 64 oz daily.   He rarely eats restaurants foods.     Since 09/01/2024 ICM Remote transmission: Optivol thoracic impedance suggesting possible fluid accumulation starting 09/22/2024.   Prescribed: Spironolactone  25 mg take 1 tablet daily Furosemide  20 mg take 1 tablet (20 mg total) by mouth daily as needed   Labs: 04/24/2024 Creatinine 1.42, BUN 17, Potassium 4.1, Sodium 142, GFR 53 A complete set of results can be found in Results Review.   Recommendation:  Advised to take 1 PRN Furosemide  tomorrow and will recheck report on 10/13/2024.   Follow-up plan: ICM clinic phone appointment on 10/13/2024 (manual) to recheck fluid levels.   91 day device clinic remote transmission 11/03/2024.    EP/Cardiology Next Office Visit:  Recall 05/16/2025 with Dr Gollan.   Recall 10/20/2024 with Suzann Riddle, NP (6 month f/u).     Copy of ICM check sent to Dr. Cindie.    Remote monitoring is medically necessary for Heart Failure Management.    Daily Thoracic Impedance ICM trend: 07/10/2024 through 10/09/2024.    12-14 Month Thoracic Impedance ICM trend:     Mitzie GORMAN Garner, RN 10/10/2024 9:32 AM

## 2024-10-13 ENCOUNTER — Ambulatory Visit: Attending: Cardiology

## 2024-10-13 DIAGNOSIS — I5022 Chronic systolic (congestive) heart failure: Secondary | ICD-10-CM

## 2024-10-13 DIAGNOSIS — Z95810 Presence of automatic (implantable) cardiac defibrillator: Secondary | ICD-10-CM

## 2024-10-14 NOTE — Progress Notes (Signed)
 EPIC Encounter for ICM Monitoring  Patient Name: Russell Scheff. is a 73 y.o. male Date: 10/14/2024 Primary Care Physican: Edman Marsa PARAS, DO Primary Cardiologist: Perla Electrophysiologist: Cindie 07/06/2023 Weight: 159 lbs 10/12/2023 Weight: 158-159 lbs 04/03/2024 Weight: 158-159 lbs 05/09/2024 Weight: 157-158 lbs 06/13/2024 Weight: 155-158 lbs 08/01/2024 Weight: 155 lbs 09/01/2024 Weight: 154 lbs  10/10/2024 Weight: 154 lbs                                                          Spoke with patient and heart failure questions reviewed.  Transmission results reviewed.  Pt asymptomatic for fluid accumulation.  Reports feeling well at this time and voices no complaints.     Diet:  Follows a low salt diet by eating a lot of fruits and vegetables and limiting fluid intake to 64 oz daily.   He rarely eats restaurants foods.     Since 10/10/2024 ICM Remote transmission: Optivol thoracic impedance suggesting fluid levels returned to normal after taking PRN Lasix .   Prescribed: Spironolactone  25 mg take 1 tablet daily Furosemide  20 mg take 1 tablet (20 mg total) by mouth daily as needed   Labs: 04/24/2024 Creatinine 1.42, BUN 17, Potassium 4.1, Sodium 142, GFR 53 A complete set of results can be found in Results Review.   Recommendation:   No changes and encouraged to call if experiencing any fluid symptoms.   Follow-up plan: ICM clinic phone appointment on 11/10/2024.   91 day device clinic remote transmission 11/03/2024.    EP/Cardiology Next Office Visit:  Recall 05/16/2025 with Dr Gollan.   Advised to call the office for 6 month device check.  Recall 10/20/2024 with Suzann Riddle, NP (6 month f/u).     Copy of ICM check sent to Dr. Cindie.    Remote monitoring is medically necessary for Heart Failure Management.    Daily Thoracic Impedance ICM trend: 07/14/2024 through 10/13/2024.    12-14 Month Thoracic Impedance ICM trend:     Russell GORMAN Garner, RN 10/14/2024 8:49  AM

## 2024-10-15 DIAGNOSIS — I252 Old myocardial infarction: Secondary | ICD-10-CM | POA: Diagnosis not present

## 2024-10-15 DIAGNOSIS — N529 Male erectile dysfunction, unspecified: Secondary | ICD-10-CM | POA: Diagnosis not present

## 2024-10-15 DIAGNOSIS — I130 Hypertensive heart and chronic kidney disease with heart failure and stage 1 through stage 4 chronic kidney disease, or unspecified chronic kidney disease: Secondary | ICD-10-CM | POA: Diagnosis not present

## 2024-10-15 DIAGNOSIS — E785 Hyperlipidemia, unspecified: Secondary | ICD-10-CM | POA: Diagnosis not present

## 2024-10-15 DIAGNOSIS — E1122 Type 2 diabetes mellitus with diabetic chronic kidney disease: Secondary | ICD-10-CM | POA: Diagnosis not present

## 2024-10-15 DIAGNOSIS — H5461 Unqualified visual loss, right eye, normal vision left eye: Secondary | ICD-10-CM | POA: Diagnosis not present

## 2024-10-15 DIAGNOSIS — I4891 Unspecified atrial fibrillation: Secondary | ICD-10-CM | POA: Diagnosis not present

## 2024-10-15 DIAGNOSIS — I509 Heart failure, unspecified: Secondary | ICD-10-CM | POA: Diagnosis not present

## 2024-10-15 DIAGNOSIS — I25119 Atherosclerotic heart disease of native coronary artery with unspecified angina pectoris: Secondary | ICD-10-CM | POA: Diagnosis not present

## 2024-10-15 DIAGNOSIS — Z823 Family history of stroke: Secondary | ICD-10-CM | POA: Diagnosis not present

## 2024-10-15 DIAGNOSIS — H409 Unspecified glaucoma: Secondary | ICD-10-CM | POA: Diagnosis not present

## 2024-10-15 DIAGNOSIS — Z7984 Long term (current) use of oral hypoglycemic drugs: Secondary | ICD-10-CM | POA: Diagnosis not present

## 2024-10-15 DIAGNOSIS — N1831 Chronic kidney disease, stage 3a: Secondary | ICD-10-CM | POA: Diagnosis not present

## 2024-10-15 DIAGNOSIS — Z95810 Presence of automatic (implantable) cardiac defibrillator: Secondary | ICD-10-CM | POA: Diagnosis not present

## 2024-10-15 DIAGNOSIS — N400 Enlarged prostate without lower urinary tract symptoms: Secondary | ICD-10-CM | POA: Diagnosis not present

## 2024-10-15 DIAGNOSIS — D6869 Other thrombophilia: Secondary | ICD-10-CM | POA: Diagnosis not present

## 2024-10-15 DIAGNOSIS — I429 Cardiomyopathy, unspecified: Secondary | ICD-10-CM | POA: Diagnosis not present

## 2024-10-15 DIAGNOSIS — Z7982 Long term (current) use of aspirin: Secondary | ICD-10-CM | POA: Diagnosis not present

## 2024-10-24 ENCOUNTER — Other Ambulatory Visit: Payer: Self-pay | Admitting: Cardiovascular Disease

## 2024-10-27 MED ORDER — SACUBITRIL-VALSARTAN 97-103 MG PO TABS: 1.0000 | ORAL_TABLET | Freq: Two times a day (BID) | ORAL | 1 refills | Status: DC

## 2024-10-29 ENCOUNTER — Ambulatory Visit

## 2024-11-03 ENCOUNTER — Ambulatory Visit: Payer: Medicare HMO

## 2024-11-03 DIAGNOSIS — I428 Other cardiomyopathies: Secondary | ICD-10-CM

## 2024-11-04 LAB — CUP PACEART REMOTE DEVICE CHECK
Battery Remaining Longevity: 74 mo
Battery Voltage: 2.99 V
Brady Statistic RV Percent Paced: 0.01 %
Date Time Interrogation Session: 20251110043824
HighPow Impedance: 53 Ohm
Implantable Lead Connection Status: 753985
Implantable Lead Implant Date: 20200514
Implantable Lead Location: 753860
Implantable Lead Model: 6935
Implantable Pulse Generator Implant Date: 20200514
Lead Channel Impedance Value: 266 Ohm
Lead Channel Impedance Value: 380 Ohm
Lead Channel Pacing Threshold Amplitude: 0.625 V
Lead Channel Pacing Threshold Pulse Width: 0.4 ms
Lead Channel Sensing Intrinsic Amplitude: 9.125 mV
Lead Channel Sensing Intrinsic Amplitude: 9.125 mV
Lead Channel Setting Pacing Amplitude: 2 V
Lead Channel Setting Pacing Pulse Width: 0.4 ms
Lead Channel Setting Sensing Sensitivity: 0.3 mV
Zone Setting Status: 755011
Zone Setting Status: 755011

## 2024-11-05 ENCOUNTER — Ambulatory Visit: Attending: Cardiology

## 2024-11-05 DIAGNOSIS — Z95810 Presence of automatic (implantable) cardiac defibrillator: Secondary | ICD-10-CM

## 2024-11-05 DIAGNOSIS — I5022 Chronic systolic (congestive) heart failure: Secondary | ICD-10-CM

## 2024-11-05 NOTE — Progress Notes (Signed)
 EPIC Encounter for ICM Monitoring  Patient Name: Russell [name]. is a 73 y.o. male Date: 11/05/2024 Primary Care Physican: Edman Marsa PARAS, DO Primary Cardiologist: Perla Electrophysiologist:  Kennyth 07/06/2023 Weight: 159 lbs 10/12/2023 Weight: 158-159 lbs 04/03/2024 Weight: 158-159 lbs 05/09/2024 Weight: 157-158 lbs 06/13/2024 Weight: 155-158 lbs 08/01/2024 Weight: 155 lbs 09/01/2024 Weight: 154 lbs  10/10/2024 Weight: 154 lbs 11/05/2024 Weight: 154 lbs                                                          Spoke with patient and heart failure questions reviewed.  Transmission results reviewed.  Pt asymptomatic for fluid accumulation.  Reports feeling well at this time and voices no complaints.  Reviewed nutrition of [employer] foods and advised his meal is about 2700 mg of salt per his order.     Diet:   11/05/2024 Pt reports he and his wife have been eating out a lot more of the last couple of months.  He eats restaurant foods about every other day at places like [employer].     Since 10/13/2024 ICM Remote transmission: Optivol thoracic impedance suggesting possible fluid accumulation starting 10/13/2024.   Has difficulty maintaining normal fluid levels over the last 2-3 months.    Prescribed: Spironolactone  25 mg take 1 tablet daily Furosemide  20 mg take 1 tablet (20 mg total) by mouth daily as needed   Labs: 04/24/2024 Creatinine 1.42, BUN 17, Potassium 4.1, Sodium 142, GFR 53 A complete set of results can be found in Results Review.   Recommendation:  Encouraged to decrease salt intake.  Advised to take PRN Lasix  20 mg daily x 3 days and then return to PRN.   Copy sent to Dr Gollan as FYI.     Follow-up plan: ICM clinic phone appointment on 11/10/2024 for 31 day and recheck fluid levels.   91 day device clinic remote transmission 02/02/2025.    EP/Cardiology Next Office Visit:  Recall 05/16/2025 with Dr Gollan.   11/24/2024 with Dr Cindie.     Copy of ICM check sent to Dr.  Kennyth.    Remote monitoring is medically necessary for Heart Failure Management.    Daily Thoracic Impedance ICM trend: 08/04/2024 through 11/03/2024.    12-14 Month Thoracic Impedance ICM trend:     Mitzie GORMAN Garner, RN 11/05/2024 8:59 AM

## 2024-11-06 NOTE — Progress Notes (Signed)
 Remote ICD Transmission

## 2024-11-09 ENCOUNTER — Ambulatory Visit: Payer: Self-pay | Admitting: Cardiology

## 2024-11-10 ENCOUNTER — Ambulatory Visit: Attending: Cardiology

## 2024-11-10 DIAGNOSIS — Z95810 Presence of automatic (implantable) cardiac defibrillator: Secondary | ICD-10-CM

## 2024-11-10 DIAGNOSIS — I5022 Chronic systolic (congestive) heart failure: Secondary | ICD-10-CM

## 2024-11-12 NOTE — Progress Notes (Signed)
 EPIC Encounter for ICM Monitoring  Patient Name: Russell [name]. is a 73 y.o. male Date: 11/12/2024 Primary Care Physican: Edman Marsa PARAS, DO Primary Cardiologist: Perla Electrophysiologist:  Kennyth 07/06/2023 Weight: 159 lbs 10/12/2023 Weight: 158-159 lbs 04/03/2024 Weight: 158-159 lbs 05/09/2024 Weight: 157-158 lbs 06/13/2024 Weight: 155-158 lbs 08/01/2024 Weight: 155 lbs 09/01/2024 Weight: 154 lbs  10/10/2024 Weight: 154 lbs 11/05/2024 Weight: 154 lbs                                                          Spoke with patient and heart failure questions reviewed.  Transmission results reviewed.  Pt asymptomatic for fluid accumulation.  Reports feeling well at this time and voices no complaints.     Diet:   11/05/2024 Pt reports he and his wife have been eating out a lot more of the last couple of months.  He eats restaurant foods about every other day at places like [employer].     Since 11/05/2024 ICM Remote transmission: Optivol thoracic impedance suggesting fluid levels returned to normal 11/05/2024.    Prescribed: Spironolactone  25 mg take 1 tablet daily Furosemide  20 mg take 1 tablet (20 mg total) by mouth daily as needed   Labs: 04/24/2024 Creatinine 1.42, BUN 17, Potassium 4.1, Sodium 142, GFR 53 A complete set of results can be found in Results Review.   Recommendation:  No changes and encouraged to call if experiencing any fluid symptoms.    Follow-up plan: ICM clinic phone appointment on 12/22/2024.   91 day device clinic remote transmission 02/02/2025.    EP/Cardiology Next Office Visit:  Recall 05/16/2025 with Dr Gollan.   11/24/2024 with Dr Cindie.     Copy of ICM check sent to Dr. Kennyth.    Remote monitoring is medically necessary for Heart Failure Management.    Daily Thoracic Impedance ICM trend: 08/11/2024 through 11/10/2024.    12-14 Month Thoracic Impedance ICM trend:     Mitzie GORMAN Garner, RN 11/12/2024 1:18 PM

## 2024-11-13 ENCOUNTER — Other Ambulatory Visit

## 2024-11-13 DIAGNOSIS — I10 Essential (primary) hypertension: Secondary | ICD-10-CM | POA: Diagnosis not present

## 2024-11-13 DIAGNOSIS — Z0000 Encounter for general adult medical examination without abnormal findings: Secondary | ICD-10-CM

## 2024-11-13 DIAGNOSIS — D649 Anemia, unspecified: Secondary | ICD-10-CM | POA: Diagnosis not present

## 2024-11-13 DIAGNOSIS — I5022 Chronic systolic (congestive) heart failure: Secondary | ICD-10-CM | POA: Diagnosis not present

## 2024-11-13 DIAGNOSIS — E785 Hyperlipidemia, unspecified: Secondary | ICD-10-CM | POA: Diagnosis not present

## 2024-11-13 DIAGNOSIS — E1136 Type 2 diabetes mellitus with diabetic cataract: Secondary | ICD-10-CM

## 2024-11-13 DIAGNOSIS — R3911 Hesitancy of micturition: Secondary | ICD-10-CM | POA: Diagnosis not present

## 2024-11-13 DIAGNOSIS — R7989 Other specified abnormal findings of blood chemistry: Secondary | ICD-10-CM

## 2024-11-13 DIAGNOSIS — E1169 Type 2 diabetes mellitus with other specified complication: Secondary | ICD-10-CM

## 2024-11-14 LAB — PSA: PSA: 1.65 ng/mL (ref <=4.00)

## 2024-11-14 LAB — COMPREHENSIVE METABOLIC PANEL WITH GFR
AG Ratio: 1.6 (calc) (ref 1.0–2.5)
ALT: 11 U/L (ref 9–46)
AST: 10 U/L (ref 10–35)
Albumin: 4.1 g/dL (ref 3.6–5.1)
Alkaline phosphatase (APISO): 41 U/L (ref 35–144)
BUN/Creatinine Ratio: 17 (calc) (ref 6–22)
BUN: 33 mg/dL — ABNORMAL HIGH (ref 7–25)
CO2: 25 mmol/L (ref 20–32)
Calcium: 9.5 mg/dL (ref 8.6–10.3)
Chloride: 104 mmol/L (ref 98–110)
Creat: 1.92 mg/dL — ABNORMAL HIGH (ref 0.70–1.28)
Globulin: 2.6 g/dL (ref 1.9–3.7)
Glucose, Bld: 140 mg/dL — ABNORMAL HIGH (ref 65–99)
Potassium: 4.9 mmol/L (ref 3.5–5.3)
Sodium: 138 mmol/L (ref 135–146)
Total Bilirubin: 0.4 mg/dL (ref 0.2–1.2)
Total Protein: 6.7 g/dL (ref 6.1–8.1)
eGFR: 37 mL/min/1.73m2 — ABNORMAL LOW (ref >=60)

## 2024-11-14 LAB — LIPID PANEL
Cholesterol: 135 mg/dL (ref <200)
HDL: 63 mg/dL (ref >=40)
LDL Cholesterol (Calc): 51 mg/dL
Non-HDL Cholesterol (Calc): 72 mg/dL (ref <130)
Total CHOL/HDL Ratio: 2.1 (calc) (ref <5.0)
Triglycerides: 127 mg/dL (ref <150)

## 2024-11-14 LAB — CBC WITH DIFFERENTIAL/PLATELET
Absolute Lymphocytes: 1310 {cells}/uL (ref 850–3900)
Absolute Monocytes: 740 {cells}/uL (ref 200–950)
Basophils Absolute: 22 {cells}/uL (ref 0–200)
Basophils Relative: 0.3 %
Eosinophils Absolute: 178 {cells}/uL (ref 15–500)
Eosinophils Relative: 2.4 %
HCT: 36.1 % — ABNORMAL LOW (ref 38.5–50.0)
Hemoglobin: 11.5 g/dL — ABNORMAL LOW (ref 13.2–17.1)
MCH: 31.6 pg (ref 27.0–33.0)
MCHC: 31.9 g/dL — ABNORMAL LOW (ref 32.0–36.0)
MCV: 99.2 fL (ref 80.0–100.0)
MPV: 10.6 fL (ref 7.5–12.5)
Monocytes Relative: 10 %
Neutro Abs: 5150 {cells}/uL (ref 1500–7800)
Neutrophils Relative %: 69.6 %
Platelets: 176 Thousand/uL (ref 140–400)
RBC: 3.64 Million/uL — ABNORMAL LOW (ref 4.20–5.80)
RDW: 13.1 % (ref 11.0–15.0)
Total Lymphocyte: 17.7 %
WBC: 7.4 Thousand/uL (ref 3.8–10.8)

## 2024-11-14 LAB — MICROALBUMIN / CREATININE URINE RATIO
Creatinine, Urine: 162 mg/dL (ref 20–320)
Microalb Creat Ratio: 6 mg/g{creat} (ref <30)
Microalb, Ur: 1 mg/dL

## 2024-11-14 LAB — TSH: TSH: 1.03 m[IU]/L (ref 0.40–4.50)

## 2024-11-14 LAB — HEMOGLOBIN A1C
Hgb A1c MFr Bld: 6.5 % — ABNORMAL HIGH (ref <5.7)
Mean Plasma Glucose: 140 mg/dL
eAG (mmol/L): 7.7 mmol/L

## 2024-11-14 LAB — T4, FREE: Free T4: 1.6 ng/dL (ref 0.8–1.8)

## 2024-11-17 ENCOUNTER — Ambulatory Visit: Payer: Self-pay | Admitting: Family Medicine

## 2024-11-18 ENCOUNTER — Other Ambulatory Visit: Payer: Self-pay | Admitting: Family Medicine

## 2024-11-18 ENCOUNTER — Ambulatory Visit (INDEPENDENT_AMBULATORY_CARE_PROVIDER_SITE_OTHER): Admitting: Family Medicine

## 2024-11-18 ENCOUNTER — Encounter: Payer: Self-pay | Admitting: Family Medicine

## 2024-11-18 VITALS — BP 110/58 | HR 60 | Ht 68.0 in | Wt 151.4 lb

## 2024-11-18 DIAGNOSIS — I5022 Chronic systolic (congestive) heart failure: Secondary | ICD-10-CM | POA: Diagnosis not present

## 2024-11-18 DIAGNOSIS — N401 Enlarged prostate with lower urinary tract symptoms: Secondary | ICD-10-CM

## 2024-11-18 DIAGNOSIS — N183 Chronic kidney disease, stage 3 unspecified: Secondary | ICD-10-CM

## 2024-11-18 DIAGNOSIS — Z0000 Encounter for general adult medical examination without abnormal findings: Secondary | ICD-10-CM | POA: Diagnosis not present

## 2024-11-18 DIAGNOSIS — Z7984 Long term (current) use of oral hypoglycemic drugs: Secondary | ICD-10-CM | POA: Diagnosis not present

## 2024-11-18 DIAGNOSIS — E785 Hyperlipidemia, unspecified: Secondary | ICD-10-CM

## 2024-11-18 DIAGNOSIS — I10 Essential (primary) hypertension: Secondary | ICD-10-CM

## 2024-11-18 DIAGNOSIS — D649 Anemia, unspecified: Secondary | ICD-10-CM

## 2024-11-18 DIAGNOSIS — E1136 Type 2 diabetes mellitus with diabetic cataract: Secondary | ICD-10-CM | POA: Diagnosis not present

## 2024-11-18 DIAGNOSIS — E1169 Type 2 diabetes mellitus with other specified complication: Secondary | ICD-10-CM

## 2024-11-18 DIAGNOSIS — I428 Other cardiomyopathies: Secondary | ICD-10-CM

## 2024-11-18 DIAGNOSIS — I25118 Atherosclerotic heart disease of native coronary artery with other forms of angina pectoris: Secondary | ICD-10-CM

## 2024-11-18 DIAGNOSIS — Z95811 Presence of heart assist device: Secondary | ICD-10-CM | POA: Insufficient documentation

## 2024-11-18 NOTE — Progress Notes (Signed)
 Subjective:    Patient ID: Russell Leobardo Raddle., male    DOB: [birth year], 73 y.o.   MRN: [id number]  Russell Reimers. is a 73 y.o. male presenting on 11/18/2024 for Annual Exam   HPI  Discussed the use of AI scribe software for clinical note transcription with the patient, who gave verbal consent to proceed.  History of Present Illness   Russell Denner. is a 73 year old male who presents for an annual physical exam.  Anemia - Mild anemia with hemoglobin between 11 and 12 over the past two years - Takes a vitamin supplement       HYPERLIPIDEMIA: - Reports no concerns. 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 Tolerating well w/o side-effects Currently on ACEi He is tolerating it well. On Pravastatin  10mg  nightly, no side effects or myalgia Improved LDL on statin Has OneTouch Ultra 2  Lifestyle:  - Diet (Improving diet, less sweets, less sugar, no sodas) - Exercise (walking Followed by [facility] Dr Myrna see below, for glaucoma and cataracts Denies hypoglycemia, polyuria, visual changes, numbness or tingling.   Elevated Cr / CKD His kidney function is being monitored, with recent labs 04/2024 showing a creatinine level of 1.42 and a GFR of 53, a slight decline from previous values. He does not see a nephrologist. - Recent increase in creatinine on blood work again, Cr 1.9 and eGFR 37 now - Possible contributing factors include suboptimal hydration and use of Lasix  for two days prior to testing - Typically drinks about two bottles of water  per day - Monitors weight and notes slight decrease, attributed to  medication and possible fluid loss - Aware of need to maintain hydration   CHRONIC HTN / Systolic CHF / Cardiomyopathy Followed by Cardiology Current Meds - Carvedilol  25mg  BID, Spironolcatone 25mg  Entrestro 97-103 BID, Isosorbide  mononitrate 60  On Amiodarone  Reports good compliance, took meds today. Tolerating well, w/o complaints.  Recently Cardiac sensor signaled cardiology elevated fluid, he took Furosemide  20mg  daily x2-3 days and less hydration,  Denies CP, dyspnea, HA, edema, dizziness / lightheadedness   BPH LUTS Followed by Dr Francisca [facility] [place] He continues on Alpha Blocker and Finasteride , he has had good results with improved urinary symptoms Prostate MRI has been normal. They are monitoring PSA PSA 1.65 x 2 = 3.2 w/ Finasteride    Left Shoulder Impingement Shoulder pain - Pain localized to the shoulder, worsened by lifting the arm or rolling over in bed - Pain exacerbated by certain movements - History of temporary relief following intervention by a therapist - Current management includes Tylenol  and use of muscle rubs   Health Maintenance:  Last Cologuard 09/10/23 negative, next due 2027  Updated Flu Shot already  Prevnar-20 due, last done PNEUMONIA vaccine 2022, can wait until 2027 or sooner if interested to boost  Shingles vaccine at pharmacy     07/18/2024    1:23 PM 05/06/2024    9:25 AM 10/26/2023   10:38 AM  Depression screen PHQ 2/9  Decreased Interest 0 0 0  Down, Depressed, Hopeless 0 0 0  PHQ - 2 Score 0 0 0  Altered sleeping  0 0 0  Tired, decreased energy 0 0 1  Change in appetite 0 0 0  Feeling bad or failure about yourself  0 0 0  Trouble concentrating 0 0 0  Moving slowly or fidgety/restless 0 0 0  Suicidal thoughts 0 0 0  PHQ-9 Score 0  0  1   Difficult doing work/chores Not difficult at all Not difficult at all Not difficult at all     Data saved with a previous flowsheet row definition       05/06/2024    9:25 AM  10/26/2023   10:38 AM 04/19/2023   10:01 AM 10/18/2022    9:57 AM  GAD 7 : Generalized Anxiety Score  Nervous, Anxious, on Edge 0 0 0 0  Control/stop worrying 0 0 0 0  Worry too much - different things 0 0 1 0  Trouble relaxing 0 0 0 0  Restless 0 0 0 0  Easily annoyed or irritable 0 0 0 0  Afraid - awful might happen 0 0 0 0  Total GAD 7 Score 0 0 1 0  Anxiety Difficulty Not difficult at all   Not difficult at all     Past Medical History:  Diagnosis Date   CAD (coronary artery disease)    05/07/2019 LHC showed nonobstructive CAD   Diabetes (HCC)    Frequent PVCs    Heart failure with reduced ejection fraction (HCC)    04/2019 EF 20-25%   Hypertension    Impotence of organic origin    NICM (nonischemic cardiomyopathy) (HCC)    HFrEF in setting of NICM   Pain in joint, shoulder region    Personal history of colonic polyps    Ventricular tachycardia (HCC)    04/2019 in setting of cardiac arrest   Past Surgical History:  Procedure Laterality Date   CARDIAC CATHETERIZATION  11/17/2013   COLONOSCOPY  2009   ICD IMPLANT N/A 05/08/2019   Procedure: ICD IMPLANT;  Surgeon: Kelsie Agent, MD;  Location: [facility] INVASIVE CV LAB;  Service: Cardiovascular;  Laterality: N/A;   RIGHT/LEFT HEART CATH AND CORONARY ANGIOGRAPHY N/A 05/07/2019   Procedure: RIGHT/LEFT HEART CATH AND CORONARY ANGIOGRAPHY;  Surgeon: Mady Bruckner, MD;  Location: [facility] INVASIVE CV LAB;  Service: Cardiovascular;  Laterality: N/A;   Social History   Socioeconomic History   Marital status: Married    Spouse name: Not on file   Number of children: Not on file   Years of education: Not on file   Highest education level: Not on file  Occupational History   Occupation: [employer]   Occupation: retired  Tobacco Use   Smoking status: Never   Smokeless tobacco: Never  Vaping Use   Vaping status: Never Used  Substance and Sexual Activity   Alcohol  use: Yes    Comment: occassional   Drug use: No   Sexual  activity: Not on file  Other Topics Concern   Not on file  Social History Narrative   Not on file   Social Drivers of Health   Financial Resource Strain: Low Risk  (07/18/2024)   Overall Financial Resource Strain (CARDIA)    Difficulty of Paying Living Expenses: Not hard at all  Food Insecurity: No Food Insecurity (07/18/2024)   Hunger Vital Sign    Worried About Running Out of Food in the Last Year: Never true    Ran Out of Food in the Last Year: Never true  Transportation Needs: No Transportation Needs (07/18/2024)   PRAPARE -  Administrator, Civil Service (Medical): No    Lack of Transportation (Non-Medical): No  Physical Activity: Insufficiently Active (07/18/2024)   Exercise Vital Sign    Days of Exercise per Week: 2 days    Minutes of Exercise per Session: 40 min  Stress: No Stress Concern Present (07/18/2024)   [employer] of Occupational Health - Occupational Stress Questionnaire    Feeling of Stress: Not at all  Social Connections: Moderately Integrated (07/18/2024)   Social Connection and Isolation Panel    Frequency of Communication with Friends and Family: More than three times a week    Frequency of Social Gatherings with Friends and Family: Once a week    Attends Religious Services: More than 4 times per year    Active Member of [employer] or Organizations: No    Attends Banker Meetings: Never    Marital Status: Married  Catering Manager Violence: Not At Risk (07/18/2024)   Humiliation, Afraid, Rape, and Kick questionnaire    Fear of Current or Ex-Partner: No    Emotionally Abused: No    Physically Abused: No    Sexually Abused: No   Family History  Problem Relation Age of Onset   Colon cancer Father        1970's   Hypertension Mother    Diabetes Mother    Current Outpatient Medications on File Prior to Visit  Medication Sig   Alcohol  Swabs (DROPSAFE ALCOHOL  PREP) 70 % PADS Apply topically.   amiodarone  (PACERONE ) 200 MG tablet TAKE  1/2 TABLET EVERY DAY   aspirin  81 MG chewable tablet Chew by mouth daily.   BD SYRINGE SLIP TIP 25G X 5/8 1 ML MISC    Blood Glucose Monitoring Suppl (ONE TOUCH ULTRA 2) w/Device KIT Use to check blood sugar x 1 daily   brimonidine (ALPHAGAN) 0.2 % ophthalmic solution Place 1 drop into both eyes 2 (two) times daily.   carvedilol  (COREG ) 25 MG tablet Take 1 tablet (25 mg total) by mouth 2 (two) times daily.   Cholecalciferol (VITAMIN D3) 2000 units TABS Take 1 capsule by mouth daily.    dorzolamide-timolol  (COSOPT) 22.3-6.8 MG/ML ophthalmic solution Place 1 drop into both eyes 2 (two) times daily.   doxazosin  (CARDURA ) 1 MG tablet Take 1 tablet (1 mg total) by mouth 2 (two) times daily.   finasteride  (PROSCAR ) 5 MG tablet Take 1 tablet (5 mg total) by mouth daily.   furosemide  (LASIX ) 20 MG tablet Take 1 tablet (20 mg total) by mouth daily as needed.   isosorbide  mononitrate (IMDUR ) 60 MG 24 hr tablet TAKE 1 AND 1/2 TABLETS TWICE DAILY   JANUVIA  100 MG tablet Take 1 tablet (100 mg total) by mouth daily.   Lancets (ONETOUCH DELICA PLUS LANCET33G) MISC USE TO CHECK BLOOD SUGAR UP TO 1 TIME DAILY   latanoprost  (XALATAN ) 0.005 % ophthalmic solution Place 1 drop into both eyes at bedtime.    metFORMIN  (GLUCOPHAGE -XR) 500 MG 24 hr tablet Take 2 tablets (1,000 mg total) by mouth daily with breakfast.   nitroGLYCERIN  (NITROSTAT ) 0.4 MG SL tablet DISSOLVE ONE TABLET UNDER THE TONGUE EVERY 5 MINUTES AS NEEDED FOR CHEST PAIN.  DO NOT EXCEED A TOTAL OF 3 DOSES IN 15 MINUTES   ONETOUCH ULTRA TEST test strip TEST BLOOD SUGAR ONE TIME DAILY   pravastatin  (PRAVACHOL ) 10 MG tablet Take 1 tablet (10 mg total) by mouth at bedtime.   sacubitril -valsartan  (ENTRESTO ) 97-103 MG Take 1 tablet by mouth 2 (two)  times daily.   spironolactone  (ALDACTONE ) 25 MG tablet Take 1 tablet (25 mg total) by mouth daily.   tamsulosin  (FLOMAX ) 0.4 MG CAPS capsule Take 1 capsule (0.4 mg total) by mouth daily.   No current  facility-administered medications on file prior to visit.    Review of Systems  Constitutional:  Negative for activity change, appetite change, chills, diaphoresis, fatigue and fever.  HENT:  Negative for congestion and hearing loss.   Eyes:  Negative for visual disturbance.  Respiratory:  Negative for cough, chest tightness, shortness of breath and wheezing.   Cardiovascular:  Negative for chest pain, palpitations and leg swelling.  Gastrointestinal:  Negative for abdominal pain, constipation, diarrhea, nausea and vomiting.  Genitourinary:  Negative for dysuria, frequency and hematuria.  Musculoskeletal:  Negative for arthralgias and neck pain.  Skin:  Negative for rash.  Neurological:  Negative for dizziness, weakness, light-headedness, numbness and headaches.  Hematological:  Negative for adenopathy.  Psychiatric/Behavioral:  Negative for behavioral problems, dysphoric mood and sleep disturbance.    Per HPI unless specifically indicated above     Objective:    BP (!) 110/58 (BP Location: Left Arm, Patient Position: Sitting, Cuff Size: Normal)   Pulse 60   Ht 5' 8 (1.727 m)   Wt 151 lb 6 oz (68.7 kg)   BMI 23.02 kg/m   Wt Readings from Last 3 Encounters:  11/18/24 151 lb 6 oz (68.7 kg)  08/12/24 156 lb 6 oz (70.9 kg)  05/16/24 157 lb 8 oz (71.4 kg)    Physical Exam Vitals and nursing note reviewed.  Constitutional:      General: He is not in acute distress.    Appearance: He is well-developed. He is not diaphoretic.     Comments: Well-appearing, comfortable, cooperative  HENT:     Head: Normocephalic and atraumatic.  Eyes:     General:        Right eye: No discharge.        Left eye: No discharge.     Conjunctiva/sclera: Conjunctivae normal.     Pupils: Pupils are equal, round, and reactive to light.  Neck:     Thyroid : No thyromegaly.     Vascular: No carotid bruit.  Cardiovascular:     Rate and Rhythm: Normal rate and regular rhythm.     Pulses: Normal pulses.      Heart sounds: Normal heart sounds. No murmur heard. Pulmonary:     Effort: Pulmonary effort is normal. No respiratory distress.     Breath sounds: Normal breath sounds. No wheezing or rales.  Abdominal:     General: Bowel sounds are normal. There is no distension.     Palpations: Abdomen is soft. There is no mass.     Tenderness: There is no abdominal tenderness.  Musculoskeletal:        General: No tenderness. Normal range of motion.     Cervical back: Normal range of motion and neck supple.     Right lower leg: No edema.     Left lower leg: No edema.     Comments: Upper / Lower Extremities: - Normal muscle tone, strength bilateral upper extremities 5/5, lower extremities 5/5  Lymphadenopathy:     Cervical: No cervical adenopathy.  Skin:    General: Skin is warm and dry.     Findings: No erythema or rash.     Comments: Dry skin dermatitis lower legs  Neurological:     Mental Status: He is alert and oriented to person,  place, and time.     Comments: Distal sensation intact to light touch all extremities  Psychiatric:        Mood and Affect: Mood normal.        Behavior: Behavior normal.        Thought Content: Thought content normal.     Comments: Well groomed, good eye contact, normal speech and thoughts     Diabetic Foot Exam - Simple   Simple Foot Form Diabetic Foot exam was performed with the following findings: Yes 11/18/2024  9:49 AM  Visual Inspection No deformities, no ulcerations, no other skin breakdown bilaterally: Yes Sensation Testing Intact to touch and monofilament testing bilaterally: Yes Pulse Check Posterior Tibialis and Dorsalis pulse intact bilaterally: Yes Comments      Results for orders placed or performed in visit on 11/13/24  T4, free   Collection Time: 11/13/24  8:35 AM  Result Value Ref Range   Free T4 1.6 0.8 - 1.8 ng/dL  Comprehensive metabolic panel with GFR   Collection Time: 11/13/24  8:35 AM  Result Value Ref Range   Glucose,  Bld 140 (H) 65 - 99 mg/dL   BUN 33 (H) 7 - 25 mg/dL   Creat 8.07 (H) 9.29 - 1.28 mg/dL   eGFR 37 (L) > OR = 60 mL/min/1.10m2   BUN/Creatinine Ratio 17 6 - 22 (calc)   Sodium 138 135 - 146 mmol/L   Potassium 4.9 3.5 - 5.3 mmol/L   Chloride 104 98 - 110 mmol/L   CO2 25 20 - 32 mmol/L   Calcium 9.5 8.6 - 10.3 mg/dL   Total Protein 6.7 6.1 - 8.1 g/dL   Albumin 4.1 3.6 - 5.1 g/dL   Globulin 2.6 1.9 - 3.7 g/dL (calc)   AG Ratio 1.6 1.0 - 2.5 (calc)   Total Bilirubin 0.4 0.2 - 1.2 mg/dL   Alkaline phosphatase (APISO) 41 35 - 144 U/L   AST 10 10 - 35 U/L   ALT 11 9 - 46 U/L  TSH   Collection Time: 11/13/24  8:35 AM  Result Value Ref Range   TSH 1.03 0.40 - 4.50 mIU/L  Microalbumin / creatinine urine ratio   Collection Time: 11/13/24  8:35 AM  Result Value Ref Range   Creatinine, Urine 162 20 - 320 mg/dL   Microalb, Ur 1.0 mg/dL   Microalb Creat Ratio 6 <30 mg/g creat  PSA   Collection Time: 11/13/24  8:35 AM  Result Value Ref Range   PSA 1.65 < OR = 4.00 ng/mL  CBC with Differential/Platelet   Collection Time: 11/13/24  8:35 AM  Result Value Ref Range   WBC 7.4 3.8 - 10.8 Thousand/uL   RBC 3.64 (L) 4.20 - 5.80 Million/uL   Hemoglobin 11.5 (L) 13.2 - 17.1 g/dL   HCT 63.8 (L) 61.4 - 49.9 %   MCV 99.2 80.0 - 100.0 fL   MCH 31.6 27.0 - 33.0 pg   MCHC 31.9 (L) 32.0 - 36.0 g/dL   RDW 86.8 88.9 - 84.9 %   Platelets 176 140 - 400 Thousand/uL   MPV 10.6 7.5 - 12.5 fL   Neutro Abs 5,150 1,500 - 7,800 cells/uL   Absolute Lymphocytes 1,310 850 - 3,900 cells/uL   Absolute Monocytes 740 200 - 950 cells/uL   Eosinophils Absolute 178 15 - 500 cells/uL   Basophils Absolute 22 0 - 200 cells/uL   Neutrophils Relative % 69.6 %   Total Lymphocyte 17.7 %   Monocytes Relative 10.0 %  Eosinophils Relative 2.4 %   Basophils Relative 0.3 %  Hemoglobin A1c   Collection Time: 11/13/24  8:35 AM  Result Value Ref Range   Hgb A1c MFr Bld 6.5 (H) <5.7 %   Mean Plasma Glucose 140 mg/dL   eAG  (mmol/L) 7.7 mmol/L  Lipid panel   Collection Time: 11/13/24  8:35 AM  Result Value Ref Range   Cholesterol 135 <200 mg/dL   HDL 63 > OR = 40 mg/dL   Triglycerides 872 <849 mg/dL   LDL Cholesterol (Calc) 51 mg/dL (calc)   Total CHOL/HDL Ratio 2.1 <5.0 (calc)   Non-HDL Cholesterol (Calc) 72 <869 mg/dL (calc)      Assessment & Plan:   Problem List Items Addressed This Visit     Benign prostatic hyperplasia with urinary hesitancy   CAD (coronary artery disease)   Chronic systolic heart failure (HCC)   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 (HCC)   Essential hypertension   Hyperlipidemia associated with type 2 diabetes mellitus (HCC)   Nonischemic cardiomyopathy (HCC)   Presence of heart assist device (HCC)   Other Visit Diagnoses       Annual physical exam    -  Primary     Long term current use of oral hypoglycemic drug         Normocytic anemia         Stage 3 chronic kidney disease, unspecified whether stage 3a or 3b CKD (HCC)            Updated Health Maintenance information Reviewed recent lab results with patient Encouraged improvement to lifestyle with diet and exercise Goal of weight loss  Adult Wellness Visit Annual wellness visit conducted. Blood pressure, cholesterol, glycemic control, PSA levels, and thyroid  function are stable. Mild anemia likely related to chronic kidney disease and heart disease.  Chronic systolic heart failure and other cardiomyopathy Followed by Cardiology ICD in place Recent weight loss may be due to fluid loss or medication effects. - Continue current heart failure management regimen. - Monitor weight and fluid status regularly. - Recent inc fluid / flare, required diuretic therapy  Atherosclerotic heart disease with angina Cholesterol levels are well-controlled with pravastatin . - Continue pravastatin  10 mg daily.  Type 2 diabetes mellitus Type 2 diabetes is well-controlled with an A1c of 6.5%. Reduced carbohydrate  intake and portion sizes have improved glycemic control. - Continue metformin  and Januvia  for diabetes management. - Encouraged continued dietary modifications to maintain glycemic control.  Essential hypertension Hypertension is well-controlled with current medication regimen. - Continue current antihypertensive regimen.  Benign prostatic hyperplasia with lower urinary tract symptoms Followed by Urology Benign prostatic hyperplasia is managed with finasteride . PSA levels are stable according to 50% reduction on Finasteride . 1.6 x 2 = 3.2 similar to prior range - Continue finasteride  for benign prostatic hyperplasia.  Suspected chronic kidney disease Stage III a vs b Elevated creatinine levels possibly due to recent diuretic use and dehydration. Normal urine tests indicate no acute kidney injury. - Ordered repeat kidney function tests on December 1st, 2025, at 10 AM. - Encouraged increased hydration to support kidney function. - Will consider referral to nephrology if kidney function remains elevated.  Anemia, likely related to chronic kidney disease and heart disease Mild anemia likely related to chronic kidney disease and heart disease. Hemoglobin and iron levels are stable. - Continue monitoring hemoglobin and iron levels.        No orders of the defined types were placed in this encounter.  No orders of the defined types were placed in this encounter.    Follow up plan: Return in about 3 months (around 02/18/2025) for 3 month DM A1c (Maybe blood / BMET for eGFR).  Repeat BMET 1 week 12/1  Marsa Officer, DO [facility] 11/18/2024, 9:26 AM

## 2024-11-18 NOTE — Patient Instructions (Addendum)
 Thank you for coming to the office today.  Shingrix Shingles vaccine free at pharmacy for medicare patients - 2 doses 2-6 months part  Future Prevnar-20 pneumonia vaccine when ready, any time.  Hydration hydration hydration Check blood on Mon 12/1 10am  Keep up with Cardiology and Urology  Labs look great except that one kidney number Creatinine higher likely due to fluid loss / with medication  Recent Labs    04/24/24 0854 08/12/24 0845 11/13/24 0835  HGBA1C 8.4* 6.7* 6.5*    If the blood work is the same we will refer to Kidney specialist If the blood work is better but you still want to talk to one we can refer  [facility] [id number] [place], [place] [address] Phone: [phone]    Please schedule a Follow-up Appointment to: Return in about 3 months (around 02/18/2025) for 3 month DM A1c (Maybe blood / BMET for eGFR).  If you have any other questions or concerns, please feel free to call the office or send a message through MyChart. You may also schedule an earlier appointment if necessary.  Additionally, you may be receiving a survey about your experience at our office within a few days to 1 week by e-mail or mail. We value your feedback.  Marsa Officer, DO [facility], [place]

## 2024-11-23 NOTE — Progress Notes (Deleted)
  Electrophysiology Office Follow up Visit Note:    Date:  11/23/2024   ID:  Russell [name]., DOB [birth year], MRN [id number]  PCP:  Edman Marsa PARAS, DO  [facility] Cardiologist:  Evalene Lunger, MD  [facility] Electrophysiologist:  OLE ONEIDA HOLTS, MD    Interval History:     Russell [name]. is a 73 y.o. male who presents for a follow up visit.   I last saw the patient April 23, 2024.  The patient has a history of chronic systolic heart failure, ventricular tachycardia and an ICD in situ.  Also a history of prior cardiac arrest.  His ICD was implanted in 2020.  He was on amiodarone  for suppression of his ventricular arrhythmias        Past medical, surgical, social and family history were reviewed.  ROS:   Please see the history of present illness.    All other systems reviewed and are negative.  EKGs/Labs/Other Studies Reviewed:    The following studies were reviewed today:  November 24, 2024 in-clinic device interrogation personally reviewed ***        Physical Exam:    VS:  There were no vitals taken for this visit.    Wt Readings from Last 3 Encounters:  11/18/24 151 lb 6 oz (68.7 kg)  08/12/24 156 lb 6 oz (70.9 kg)  05/16/24 157 lb 8 oz (71.4 kg)     GEN: no distress CARD: RRR, No MRG.  Generator pocket well-healed RESP: No IWOB. CTAB.      ASSESSMENT:    No diagnosis found. PLAN:    In order of problems listed above:  #Ventricular tachycardia #ICD in situ Device functioning appropriately.  Continue remote monitoring Ventricular pacing less than 1%  #High risk med monitoring-amiodarone  Electrically quiescent on amiodarone  Continue amiodarone  100 mg by mouth once daily Lab work in November showed stable liver and thyroid  function  #Chronic systolic heart failure Continue Entresto , Aldactone , Imdur , Lasix , Coreg  NYHA class II.  Warm and dry on exam. Last EF 30% in November 2024 Follows with Dr. Gollan  I discussed my  upcoming departure from [name] during today's clinic appointment.  The patient will continue to follow-up with one of my EP partners moving forward.   Follow-up with Dr. Gollan in 6 months and EP APP in 1 year     Signed, Ole Holts, MD, [facility], [facility] 11/23/2024 10:36 AM    Electrophysiology [facility]

## 2024-11-24 ENCOUNTER — Other Ambulatory Visit

## 2024-11-24 ENCOUNTER — Ambulatory Visit: Admitting: Cardiology

## 2024-11-24 DIAGNOSIS — Z95810 Presence of automatic (implantable) cardiac defibrillator: Secondary | ICD-10-CM

## 2024-11-24 DIAGNOSIS — N183 Chronic kidney disease, stage 3 unspecified: Secondary | ICD-10-CM | POA: Diagnosis not present

## 2024-11-24 DIAGNOSIS — I5022 Chronic systolic (congestive) heart failure: Secondary | ICD-10-CM

## 2024-11-24 DIAGNOSIS — I428 Other cardiomyopathies: Secondary | ICD-10-CM

## 2024-11-24 DIAGNOSIS — I472 Ventricular tachycardia, unspecified: Secondary | ICD-10-CM

## 2024-11-24 LAB — BASIC METABOLIC PANEL WITH GFR
BUN/Creatinine Ratio: 13 (calc) (ref 6–22)
BUN: 22 mg/dL (ref 7–25)
CO2: 24 mmol/L (ref 20–32)
Calcium: 9 mg/dL (ref 8.6–10.3)
Chloride: 106 mmol/L (ref 98–110)
Creat: 1.66 mg/dL — ABNORMAL HIGH (ref 0.70–1.28)
Glucose, Bld: 139 mg/dL — ABNORMAL HIGH (ref 65–99)
Potassium: 4.2 mmol/L (ref 3.5–5.3)
Sodium: 138 mmol/L (ref 135–146)
eGFR: 44 mL/min/1.73m2 — ABNORMAL LOW (ref >=60)

## 2024-11-24 NOTE — Progress Notes (Unsigned)
  Electrophysiology Office Follow up Visit Note:    Date:  11/25/2024   ID:  Russell Leobardo Raddle., DOB 10/10/1951, MRN [id number]  PCP:  Russell [name]  [facility] Cardiologist:  Evalene Lunger, MD  [facility] Electrophysiologist:  OLE ONEIDA HOLTS, MD    Interval History:     Russell Belvedere. is a 73 y.o. male who presents for a follow up visit.   I last saw the patient April 23, 2024.  The patient has a history of chronic systolic heart failure, ventricular tachycardia and an ICD in situ.  Also a history of prior cardiac arrest.  His ICD was implanted in 2020.  He was on amiodarone  for suppression of his ventricular arrhythmias He is doing well today.  No cardiovascular complaints.  No problems with his defibrillator.       Past medical, surgical, social and family history were reviewed.  ROS:   Please see the history of present illness.    All other systems reviewed and are negative.  EKGs/Labs/Other Studies Reviewed:    The following studies were reviewed today:  November 24, 2024 in-clinic device interrogation personally reviewed Battery and lead parameters stable.        Physical Exam:    VS:  BP 108/70   Pulse 74   Ht 5' 8 (1.727 m)   Wt 149 lb (67.6 kg)   SpO2 98%   BMI 22.66 kg/m     Wt Readings from Last 3 Encounters:  11/25/24 149 lb (67.6 kg)  11/18/24 151 lb 6 oz (68.7 kg)  08/12/24 156 lb 6 oz (70.9 kg)     GEN: no distress CARD: RRR, No MRG.  Generator pocket well-healed RESP: No IWOB. CTAB.      ASSESSMENT:    1. VT (ventricular tachycardia) (HCC)   2. ICD (implantable cardioverter-defibrillator) in place   3. Encounter for long-term (current) use of high-risk medication   4. Chronic systolic heart failure (HCC)    PLAN:    In order of problems listed above:  #Ventricular tachycardia #ICD in situ Device functioning appropriately.  Continue remote monitoring Ventricular pacing less than 1%  #High risk med  monitoring-amiodarone  Electrically quiescent on amiodarone  Continue amiodarone  100 mg by mouth once daily Lab work in November showed stable liver and thyroid  function  #Chronic systolic heart failure Continue Entresto , Aldactone , Imdur , Lasix , Coreg  NYHA class II.  Warm and dry on exam. Last EF 30% in November 2024 Follows with Dr. Gollan  I discussed my upcoming departure from [name] during today's clinic appointment.  The patient will continue to follow-up with one of my EP partners moving forward.   Follow-up with EP APP in 6 months.     Signed, Ole Holts, MD, [facility], [facility] 11/25/2024 3:38 PM    Electrophysiology [facility]

## 2024-11-25 ENCOUNTER — Encounter: Payer: Self-pay | Admitting: Cardiology

## 2024-11-25 ENCOUNTER — Ambulatory Visit: Attending: Cardiology | Admitting: Cardiology

## 2024-11-25 VITALS — BP 108/70 | HR 74 | Ht 68.0 in | Wt 149.0 lb

## 2024-11-25 DIAGNOSIS — Z95810 Presence of automatic (implantable) cardiac defibrillator: Secondary | ICD-10-CM

## 2024-11-25 DIAGNOSIS — Z79899 Other long term (current) drug therapy: Secondary | ICD-10-CM | POA: Diagnosis not present

## 2024-11-25 DIAGNOSIS — I472 Ventricular tachycardia, unspecified: Secondary | ICD-10-CM | POA: Diagnosis not present

## 2024-11-25 DIAGNOSIS — I5022 Chronic systolic (congestive) heart failure: Secondary | ICD-10-CM

## 2024-11-25 LAB — CUP PACEART INCLINIC DEVICE CHECK
Date Time Interrogation Session: 20251202154431
Implantable Lead Connection Status: 753985
Implantable Lead Implant Date: 20200514
Implantable Lead Location: 753860
Implantable Lead Model: 6935
Implantable Pulse Generator Implant Date: 20200514

## 2024-11-25 NOTE — Patient Instructions (Signed)
 Medication Instructions:  Your physician recommends that you continue on your current medications as directed. Please refer to the Current Medication list given to you today.  *If you need a refill on your cardiac medications before your next appointment, please call your pharmacy*  Follow-Up: At [facility], you and your health needs are our priority.  As part of our continuing mission to provide you with exceptional heart care, our providers are all part of one team.  This team includes your primary Cardiologist (physician) and Advanced Practice Providers or APPs (Physician Assistants and Nurse Practitioners) who all work together to provide you with the care you need, when you need it.  Your next appointment:   6 months  Provider:   Suzann Riddle, NP

## 2024-11-26 ENCOUNTER — Ambulatory Visit: Payer: Self-pay | Admitting: Family Medicine

## 2024-11-26 ENCOUNTER — Encounter: Payer: Self-pay | Admitting: Family Medicine

## 2024-11-26 DIAGNOSIS — N183 Chronic kidney disease, stage 3 unspecified: Secondary | ICD-10-CM | POA: Insufficient documentation

## 2024-11-30 ENCOUNTER — Other Ambulatory Visit: Payer: Self-pay | Admitting: Family Medicine

## 2024-11-30 DIAGNOSIS — E1136 Type 2 diabetes mellitus with diabetic cataract: Secondary | ICD-10-CM

## 2024-12-01 ENCOUNTER — Ambulatory Visit: Payer: Self-pay | Admitting: Cardiology

## 2024-12-02 NOTE — Telephone Encounter (Signed)
 Requested medications are due for refill today.  yes  Requested medications are on the active medications list.  yes  Last refill. 09/18/2024 #100 for both  Future visit scheduled.   no  Notes to clinic.  Protocol will not attach    Requested Prescriptions  Pending Prescriptions Disp Refills   ONETOUCH ULTRA TEST test strip [Pharmacy Med Name: ONETOUCH ULTRA In Vitro Strip] 100 strip 3    Sig: TEST BLOOD SUGAR ONE TIME DAILY     There is no refill protocol information for this order     Lancets (ONETOUCH DELICA PLUS LANCET33G) MISC [Pharmacy Med Name: ONETOUCH DELICA PLUS LANCETS EXTRA FINE 33G] 100 each 3    Sig: USE TO CHECK BLOOD SUGAR UP TO 1 TIME DAILY     Endocrinology: Diabetes - Testing Supplies Passed - 12/02/2024  3:14 PM      Passed - Valid encounter within last 12 months    Recent Outpatient Visits           2 weeks ago Annual physical exam   [place] [facility], Marsa PARAS, DO   3 months ago 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 [facility])   [facility] [place], Marsa PARAS, DO   7 months ago 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 [facility])   [facility] Marsa PARAS, DO       Future Appointments             In 1 month Francisca, Redell BROCKS, MD [facility] [place]

## 2024-12-08 ENCOUNTER — Telehealth: Payer: Self-pay | Admitting: Cardiovascular Disease

## 2024-12-08 NOTE — Telephone Encounter (Signed)
° °*  STAT* If patient is at the pharmacy, call can be transferred to refill team.   1. Which medications need to be refilled? (please list name of each medication and dose if known)   sacubitril -valsartan  (ENTRESTO ) 97-103 MG     2. Would you like to learn more about the convenience, safety, & potential cost savings by using the [facility]? No      3. Are you open to using the [facility] (Type [facility]. ). No    4. Which pharmacy/location (including street and city if local pharmacy) is medication to be sent to? [facility]   5. Do they need a 30 day or 90 day supply? 90 days  Patient said he had not receive his refills from [facility] last month. He is enrolled to [employer] patient assistance

## 2024-12-09 ENCOUNTER — Other Ambulatory Visit (HOSPITAL_COMMUNITY): Payer: Self-pay

## 2024-12-09 ENCOUNTER — Telehealth: Payer: Self-pay | Admitting: Pharmacy Technician

## 2024-12-09 ENCOUNTER — Other Ambulatory Visit: Payer: Self-pay | Admitting: Emergency Medicine

## 2024-12-09 MED ORDER — SACUBITRIL-VALSARTAN 97-103 MG PO TABS: 1.0000 | ORAL_TABLET | Freq: Two times a day (BID) | ORAL | 3 refills | Status: AC

## 2024-12-09 NOTE — Progress Notes (Signed)
 Called and spoke with the patient to inform him that prescription for Sacubitril -Valsartan  (Entresto ) 97-103 to [facility].  Patient verbalized understanding and expressed gratitude for the call.

## 2024-12-09 NOTE — Telephone Encounter (Signed)
 [facility] and they received prescription and grant info  They can only do 30 days at a time  Now free

## 2024-12-09 NOTE — Telephone Encounter (Signed)
° °  I called [facility] and they said they needed a prescription for this entresto  but it shows rx was sent 10/27/24. They [facility] me to [facility] and they said they never got that prescription. Can the prescription please be [facility] since the assistance at [facility] is ending anyways and he has the [employer]. Thank you!      Patient also has hw grant that [employer] set up:   Cardiomyopathy - [employer]. Patient is approved  09/06/2024 through 09/05/2025             ID: [id number] BIN: [id number] PCN: [id number] Group: [id number]  Hw [phone]:[id number]

## 2024-12-09 NOTE — Telephone Encounter (Signed)
°  °  I called [facility] and they said they needed a prescription for this entresto  but it shows rx was sent 10/27/24. They sent me to [facility] pharmacy and they said they never got that prescription. Can the prescription please be [facility] since the assistance at [employer] is ending anyways and he has the [employer]. Thank you!          Patient also has hw grant that [employer] set up:    Cardiomyopathy - [employer]. Patient is approved  09/06/2024 through 09/05/2025             ID: [id number] BIN: [id number] PCN: [id number] Group: [id number]   Hw [id number]      Made note in another encounter

## 2024-12-11 ENCOUNTER — Telehealth: Payer: Self-pay

## 2024-12-11 NOTE — Telephone Encounter (Signed)
 PAP: Patient assistance application for Januia through [employer] has been mailed to pt's home address on file. Provider portion of application will be faxed to provider's office.

## 2024-12-21 ENCOUNTER — Other Ambulatory Visit: Payer: Self-pay | Admitting: Urology

## 2024-12-21 DIAGNOSIS — N401 Enlarged prostate with lower urinary tract symptoms: Secondary | ICD-10-CM

## 2024-12-22 ENCOUNTER — Ambulatory Visit: Attending: Cardiology

## 2024-12-22 DIAGNOSIS — Z95810 Presence of automatic (implantable) cardiac defibrillator: Secondary | ICD-10-CM | POA: Diagnosis not present

## 2024-12-22 DIAGNOSIS — I5022 Chronic systolic (congestive) heart failure: Secondary | ICD-10-CM

## 2024-12-23 NOTE — Progress Notes (Signed)
 EPIC Encounter for ICM Monitoring  Patient Name: Russell [name]. is a 73 y.o. male Date: 12/23/2024 Primary Care Physican: Edman Marsa PARAS, DO Primary Cardiologist: Perla Electrophysiologist:  Kennyth 07/06/2023 Weight: 159 lbs 10/12/2023 Weight: 158-159 lbs 04/03/2024 Weight: 158-159 lbs 05/09/2024 Weight: 157-158 lbs 06/13/2024 Weight: 155-158 lbs 08/01/2024 Weight: 155 lbs 09/01/2024 Weight: 154 lbs  10/10/2024 Weight: 154 lbs 11/05/2024 Weight: 154 lbs                                                          Spoke with patient and heart failure questions reviewed.  Transmission results reviewed.  Pt asymptomatic for fluid accumulation.  Reports feeling well at this time and voices no complaints.     Diet:  12/23/2024 He has been cooking more meals at home and eating less restaurant foods.    Since 11/10/2024 ICM Remote transmission: Optivol thoracic impedance suggesting possible fluid accumulation starting 12/01/2024 and trending close to baseline after 12/15/2024.    Prescribed: Spironolactone  25 mg take 1 tablet daily Furosemide  20 mg take 1 tablet (20 mg total) by mouth daily as needed   Labs: 04/24/2024 Creatinine 1.42, BUN 17, Potassium 4.1, Sodium 142, GFR 53 A complete set of results can be found in Results Review.   Recommendation:  No changes and encouraged to call if experiencing any fluid symptoms.    Follow-up plan: ICM clinic phone appointment on 01/26/2025.   91 day device clinic remote transmission 02/02/2025.    EP/Cardiology Next Office Visit:  Recall 05/16/2025 with Dr Gollan.   Recall 05/24/2025 with Suzann Riddle, NP.     Copy of ICM check sent to Dr. Kennyth.     Remote monitoring is medically necessary for Heart Failure Management.    Daily Thoracic Impedance ICM trend: 09/22/2024 through 12/22/2024.    12-14 Month Thoracic Impedance ICM trend:     Mitzie GORMAN Garner, RN 12/23/2024 1:57 PM

## 2024-12-26 NOTE — Telephone Encounter (Signed)
 Received PAP application patient portion for Januvia  [employer]).

## 2024-12-29 ENCOUNTER — Other Ambulatory Visit: Admitting: Pharmacist

## 2024-12-29 DIAGNOSIS — I10 Essential (primary) hypertension: Secondary | ICD-10-CM

## 2024-12-29 DIAGNOSIS — N183 Chronic kidney disease, stage 3 unspecified: Secondary | ICD-10-CM

## 2024-12-29 DIAGNOSIS — E1136 Type 2 diabetes mellitus with diabetic cataract: Secondary | ICD-10-CM

## 2024-12-29 DIAGNOSIS — Z7984 Long term (current) use of oral hypoglycemic drugs: Secondary | ICD-10-CM

## 2024-12-29 NOTE — Progress Notes (Signed)
 "  12/29/2024 Name: Russell [name]. MRN: [id number] DOB: 1951/12/22  Chief Complaint  Patient presents with   Medication Management   Medication Assistance   Medication Adherence    Russell [name]. is a 74 y.o. year old male who presented for a telephone visit.   They were referred to the pharmacist by PCP for assistance in managing diabetes, hypertension, medication access, and complex medication management.    Subjective:   Care Team: Primary Care Provider: Edman Marsa PARAS, DO ; Next Scheduled Visit: 02/24/2025 Cardiologist: Gollan, Timothy J, MD Urology: Francisca Redell BROCKS, MD  Medication Access/Adherence  Current Pharmacy:  [facility] [place], [place] - [address] [place] [address] Phone: [phone] Fax: [phone]  [facility] - [place], [place] - [address] [place] [address] Phone: [phone] Fax: [phone]   Patient reports affordability concerns with their medications: No  Patient reports access/transportation concerns to their pharmacy: No  Patient reports adherence concerns with their medications:  No     Per Results Follow-Up note from PCP from 11/26/2024, PCP advised patient that based on lab result from 12/1, kidney function improved and referral to Nephrologist no longer required, but still an option if patient interested. - Today patient states that he would like to hold off on Nephrologist for now, but to consider if needed after next lab work   Diabetes:   Current medications:  - metformin  ER 500 mg - 2 tablets daily - Januvia  100 mg daily    Previous therapies tried: Jardiance  10 mg daily (hypotension; urinary symptoms); was unable to tolerate dose of metformin  ER >1000 mg/day; Rybelsus  (GI upset/no appetite)   Recent morning fasting reading ranging: 114-144   Patient denies hypoglycemic s/sx including dizziness, shakiness, sweating.     Statin: pravastatin  10 mg  daily   Current physical activity: Denies walking recently as much since the holiday, but planning to restart walking at the mall or outside ~45 minutes x 2 times/week   Current medication access support: Collaborating with PCP and CPhT for re-enrollment in patient assistance for Januvia  from [employer] for 2026 calendar year - Reports currently has ~30 tablets of Januvia  100 mg remaining      Hypertension/Systolic CHF/Cardiomyopathy:   Patient followed by [facility] [place]   Current medications:  - carvedilol  25 mg twice daily - isosorbide  ER 60 mg - 1.5 tablets (90 mg) twice daily - Entresto  97/103 twice daily - spironolactone  25 mg daily - doxazosin  1 mg twice daily  - furosemide  20 daily as needed for any ankle swelling or abdominal distention, weight gain   Previous therapies tried: Jardiance  10 mg daily (hypotension; urinary symptoms)   Patient has an automated, upper arm home BP cuff Recent blood pressure readings: - 1/2: 113/71, HR 71   Denies symptoms of hypotension such as dizziness or lightheadedness   Confirms weighs himself daily as recommended by Cardiologist; weight staying ~149 lbs - Denies s/sx of swelling at this time    Current Support Support: Enrolled in [employer] Cardiomyopathy - [employer] for Entresto . Patient is approved through 09/05/2025   Current physical activity: Denies walking recently as much since the holiday, but planning to restart walking at the mall or outside ~45 minutes x 2 times/week     Objective:  Lab Results  Component Value Date   HGBA1C 6.5 (H) 11/13/2024    Lab Results  Component Value Date  CREATININE 1.66 (H) 11/24/2024   BUN 22 11/24/2024   NA 138 11/24/2024   K 4.2 11/24/2024   CL 106 11/24/2024   CO2 24 11/24/2024    Lab Results  Component Value Date   CHOL 135 11/13/2024   HDL 63 11/13/2024   LDLCALC 51 11/13/2024   TRIG 127 11/13/2024   CHOLHDL 2.1 11/13/2024   BP Readings from Last 3  Encounters:  11/25/24 108/70  11/18/24 (!) 110/58  08/12/24 122/70   Pulse Readings from Last 3 Encounters:  11/25/24 74  11/18/24 60  08/12/24 66    Medications Reviewed Today     Reviewed by Alana Sharyle LABOR, RPH-CPP (Pharmacist) on 12/29/24 at 1258  Med List Status: <None>   Medication Order Taking? Sig Documenting Provider Last Dose Status Informant  Alcohol  Swabs (DROPSAFE ALCOHOL  PREP) 70 % PADS [id number]  Apply topically. [provider]  Active   amiodarone  (PACERONE ) 200 MG tablet [id number]  TAKE 1/2 TABLET EVERY DAY Riddle, Suzann, NP  Active   aspirin  81 MG chewable tablet [id number]  Chew by mouth daily. [provider]  Active   BD SYRINGE SLIP TIP 25G X 5/8 1 ML MISC [id number]   [provider]  Active   Blood Glucose Monitoring Suppl (ONE TOUCH ULTRA 2) w/Device KIT [id number]  Use to check blood sugar x 1 daily Karamalegos, Marsa PARAS, DO  Active   brimonidine (ALPHAGAN) 0.2 % ophthalmic solution [id number]  Place 1 drop into both eyes 2 (two) times daily. [provider]  Active   carvedilol  (COREG ) 25 MG tablet [id number] Yes Take 1 tablet (25 mg total) by mouth 2 (two) times daily. Gollan, Timothy J, MD  Active   chlorhexidine  (PERIDEX ) 0.12 % solution [id number]   [provider]  Active   Cholecalciferol (VITAMIN D3) 2000 units TABS [id number]  Take 1 capsule by mouth daily.  [provider]  Active Spouse/Significant Other  dorzolamide-timolol  (COSOPT) 22.3-6.8 MG/ML ophthalmic solution [id number]  Place 1 drop into both eyes 2 (two) times daily. [provider]  Active   doxazosin  (CARDURA ) 1 MG tablet [id number] Yes Take 1 tablet (1 mg total) by mouth 2 (two) times daily. Gollan, Timothy J, MD  Active   finasteride  (PROSCAR ) 5 MG tablet [id number]  TAKE 1 TABLET EVERY DAY Francisca Redell BROCKS, MD  Active   furosemide  (LASIX ) 20 MG tablet [id number] Yes Take 1 tablet (20 mg total) by mouth daily as needed.  Gollan, Timothy J, MD  Active   isosorbide  mononitrate (IMDUR ) 60 MG 24 hr tablet [id number] Yes TAKE 1 AND 1/2 TABLETS TWICE DAILY Gollan, Timothy J, MD  Active   JANUVIA  100 MG tablet [id number]  Take 1 tablet (100 mg total) by mouth daily. Edman Marsa PARAS, DO  Active   Lancets [facility] DELICA PLUS [place]) MISC [id number]  USE TO CHECK BLOOD SUGAR UP TO 1 TIME DAILY Edman Marsa PARAS, DO  Active   latanoprost  (XALATAN ) 0.005 % ophthalmic solution [id number]  Place 1 drop into both eyes at bedtime.  [provider]  Active Spouse/Significant Other  metFORMIN  (GLUCOPHAGE -XR) 500 MG 24 hr tablet [id number]  Take 2 tablets (1,000 mg total) by mouth daily with breakfast. Edman Marsa PARAS, DO  Active   nitroGLYCERIN  (NITROSTAT ) 0.4 MG SL tablet [id number]  DISSOLVE ONE TABLET UNDER THE TONGUE EVERY 5 MINUTES AS NEEDED FOR CHEST PAIN.  DO NOT EXCEED A TOTAL OF 3 DOSES IN 15 MINUTES Gollan, Timothy J, MD  Active  ONETOUCH ULTRA TEST test strip [id number]  TEST BLOOD SUGAR ONE TIME DAILY Edman Marsa PARAS, DO  Active   pravastatin  (PRAVACHOL ) 10 MG tablet [id number]  Take 1 tablet (10 mg total) by mouth at bedtime. Edman Marsa PARAS, DO  Active   sacubitril -valsartan  (ENTRESTO ) 97-103 MG [id number] Yes Take 1 tablet by mouth 2 (two) times daily. Gollan, Timothy J, MD  Active   spironolactone  (ALDACTONE ) 25 MG tablet [id number] Yes Take 1 tablet (25 mg total) by mouth daily. Gollan, Timothy J, MD  Active   tamsulosin  (FLOMAX ) 0.4 MG CAPS capsule [id number]  Take 1 capsule (0.4 mg total) by mouth daily. Francisca Redell BROCKS, MD  Active               Assessment/Plan:   Renal dose adjustment: patient's latest eGFR on 12/1 was ~44 Manufacturer labeling for Januvia  recommends dose reduction to 50 mg once daily for eGFR >=30 to <45 Today recommend patient to reduce his current Januvia  100 to 1/2 tablet (50 mg) daily  Patient verbalizes understanding and states has a  pill cutter at home  Diabetes: - Controlled - Reviewed long term cardiovascular and renal outcomes of uncontrolled blood sugar - Reviewed goal A1c, goal fasting, and goal 2 hour post prandial glucose - Reviewed dietary modifications including importance of having regular well-balanced meals throughout the day, while controlling carbohydrate portion sizes - Recommend to check glucose, keep log of results and have this record to review at upcoming medical appointments. Patient to contact provider office sooner if needed for readings outside of established parameters or symptoms             Encouraged patient to use glucose checks as feedback on dietary choices - Patient to contact [employer] patient assistance as needed for refills of Januvia  - Collaborating with PCP and CPhT for re-enrollment in patient assistance for Januvia  from [employer] for 2026 calendar year Will collaborate with both PCP and CPhT regarding renal dosing of Januvia  now for this application   Hypertension/Systolic CHF/Cardiomyopathy: - Recommended to continue to check home blood pressure and heart rate as well as daily weight, keep log of results and have record to review during future appointments Patient to contact Cardiology or PCP office if noting readings outside of established parameters or if having any symptoms, including any dizziness or lightheadedness Encourage patient to bring current home monitor with him to next office visit - Have encouraged patient to continue to take positional changes slowly - Have counseled patient on importance of taking his blood pressure medications before coming to his medical appointments      Follow Up Plan: Clinical Pharmacist will follow up with patient by telephone on 01/26/2025 at 10:00 AM     Sharyle Sia, PharmD, Russell [name], CPP Clinical Pharmacist [facility] [phone]   "

## 2024-12-29 NOTE — Patient Instructions (Signed)
 Goals Addressed             This Visit's Progress    Pharmacy Goals       If you need to reach out to patient assistance programs regarding refills or to find out the status of your application, you can do so by calling:  [employer] [phone]   Our goal A1c is less than 7%. This corresponds with fasting sugars less than 130 and 2 hour after meal sugars less than 180. Please keep a log of your results when checking your blood sugar    Our blood pressure goal is less than 130/80.  To appropriately check your blood pressure, make sure you do the following:  1) Avoid caffeine, exercise, or tobacco products for 30 minutes before checking. Empty your bladder. 2) Sit with your back supported in a flat-backed chair. Rest your arm on something flat (arm of the chair, table, etc). 3) Sit still with your feet flat on the floor, resting, for at least 5 minutes.  4) Check your blood pressure. Take 1-2 readings.  5) Write down these readings and bring with you to any provider appointments.  Bring your home blood pressure machine with you to a provider's office for accuracy comparison at least once a year.   Make sure you take your blood pressure medications before you come to any office visit, even if you were asked to fast for labs.  Our goal bad cholesterol, or LDL, is less than 70 . This is why it is important to continue taking your pravastatin .  Thank you!    Sharyle Sia, PharmD, JAQUELINE, CPP Clinical Pharmacist [facility] [phone]

## 2024-12-31 ENCOUNTER — Other Ambulatory Visit: Payer: Self-pay | Admitting: Urology

## 2024-12-31 DIAGNOSIS — N401 Enlarged prostate with lower urinary tract symptoms: Secondary | ICD-10-CM

## 2025-01-01 NOTE — Telephone Encounter (Signed)
 PAP: Application for Januvia  has been submitted to [employer], via fax with supporting documents

## 2025-01-14 ENCOUNTER — Other Ambulatory Visit: Payer: Self-pay | Admitting: Pharmacist

## 2025-01-14 DIAGNOSIS — E1136 Type 2 diabetes mellitus with diabetic cataract: Secondary | ICD-10-CM

## 2025-01-14 DIAGNOSIS — Z7984 Long term (current) use of oral hypoglycemic drugs: Secondary | ICD-10-CM

## 2025-01-14 NOTE — Progress Notes (Signed)
" ° ° ° °  01/14/2025 Name: [name]. MRN: [id number] DOB: 09-15-1951  Receive call from patient. Currently collaborating with PCP and CPhT for re-enrollment in patient assistance for Januvia  from [employer] for 2026 calendar year. From review of notes from CPhT [place], note this application was submitted to [employer] via fax on 01/01/2025.  Today patient advises that he received attestation letter back from [employer]. Reports that he completed form, signed and will mail this back to [employer]. - Will provide an update to CPhT  Follow Up Plan: Clinical Pharmacist will follow up with patient by telephone on 01/26/2025 at 10:00 AM     Sharyle Sia, PharmD, JAQUELINE, CPP Clinical Pharmacist [facility] [phone] "

## 2025-01-22 NOTE — Progress Notes (Signed)
 31 day ICM Remote transmission canceled due to [facility] clinic is on hold until further notice.  91 day remote monitoring will continue per protocol.

## 2025-01-24 ENCOUNTER — Other Ambulatory Visit: Payer: Self-pay | Admitting: Urology

## 2025-01-24 DIAGNOSIS — N401 Enlarged prostate with lower urinary tract symptoms: Secondary | ICD-10-CM

## 2025-01-26 ENCOUNTER — Ambulatory Visit

## 2025-01-26 ENCOUNTER — Other Ambulatory Visit

## 2025-01-27 ENCOUNTER — Ambulatory Visit: Admitting: Urology

## 2025-01-27 MED ORDER — TAMSULOSIN HCL 0.4 MG PO CAPS: 0.4000 mg | ORAL_CAPSULE | Freq: Every day | ORAL | 3 refills | Status: AC

## 2025-01-27 NOTE — Addendum Note (Signed)
 Addended by: ELOUISE SANTA BROCKS on: 01/27/2025 02:51 PM   Modules accepted: Orders

## 2025-01-28 ENCOUNTER — Ambulatory Visit: Payer: Medicare HMO | Admitting: Urology

## 2025-02-02 ENCOUNTER — Other Ambulatory Visit

## 2025-02-02 ENCOUNTER — Ambulatory Visit: Payer: Medicare HMO

## 2025-02-04 ENCOUNTER — Ambulatory Visit: Admitting: Urology

## 2025-02-24 ENCOUNTER — Ambulatory Visit: Admitting: Family Medicine

## 2025-05-04 ENCOUNTER — Ambulatory Visit

## 2025-05-11 ENCOUNTER — Ambulatory Visit: Admitting: Cardiovascular Disease

## 2025-07-31 ENCOUNTER — Ambulatory Visit

## 2025-08-03 ENCOUNTER — Ambulatory Visit

## 2025-08-05 ENCOUNTER — Ambulatory Visit

## 2025-11-02 ENCOUNTER — Ambulatory Visit

## 2026-02-01 ENCOUNTER — Ambulatory Visit
